# Patient Record
Sex: Male | Born: 1980 | Race: Black or African American | Hispanic: No | Marital: Single | Smoking: Never smoker
Health system: Southern US, Community
[De-identification: ages and names within clinical notes are randomized; demographics above are authoritative.]

## PROBLEM LIST (undated history)

## (undated) DIAGNOSIS — Z789 Other specified health status: Secondary | ICD-10-CM

## (undated) DIAGNOSIS — K639 Disease of intestine, unspecified: Secondary | ICD-10-CM

## (undated) DIAGNOSIS — R3915 Urgency of urination: Secondary | ICD-10-CM

## (undated) HISTORY — PX: CIRCUMCISION: SHX1350

---

## 2007-05-14 ENCOUNTER — Ambulatory Visit: Payer: Self-pay | Admitting: Urology

## 2008-03-07 ENCOUNTER — Encounter: Admission: RE | Admit: 2008-03-07 | Discharge: 2008-03-07 | Payer: Self-pay | Admitting: Family Medicine

## 2008-03-12 ENCOUNTER — Encounter: Admission: RE | Admit: 2008-03-12 | Discharge: 2008-03-12 | Payer: Self-pay | Admitting: Family Medicine

## 2008-07-19 ENCOUNTER — Emergency Department: Payer: Self-pay | Admitting: Emergency Medicine

## 2008-08-13 HISTORY — PX: BACK SURGERY: SHX140

## 2008-09-23 ENCOUNTER — Encounter: Admission: RE | Admit: 2008-09-23 | Discharge: 2008-09-23 | Payer: Self-pay | Admitting: Neurology

## 2008-10-08 ENCOUNTER — Encounter: Admission: RE | Admit: 2008-10-08 | Discharge: 2008-10-08 | Payer: Self-pay | Admitting: Neurology

## 2008-10-27 ENCOUNTER — Ambulatory Visit: Payer: Self-pay | Admitting: Thoracic Surgery

## 2008-10-29 ENCOUNTER — Inpatient Hospital Stay (HOSPITAL_COMMUNITY): Admission: RE | Admit: 2008-10-29 | Discharge: 2008-11-02 | Payer: Self-pay | Admitting: Neurosurgery

## 2008-10-29 ENCOUNTER — Ambulatory Visit: Payer: Self-pay | Admitting: Thoracic Surgery

## 2008-11-30 ENCOUNTER — Ambulatory Visit: Payer: Self-pay | Admitting: Thoracic Surgery

## 2008-11-30 ENCOUNTER — Encounter: Admission: RE | Admit: 2008-11-30 | Discharge: 2008-11-30 | Payer: Self-pay | Admitting: Thoracic Surgery

## 2010-05-09 ENCOUNTER — Emergency Department: Payer: Self-pay | Admitting: Emergency Medicine

## 2010-11-23 LAB — POCT I-STAT 7, (LYTES, BLD GAS, ICA,H+H)
Acid-base deficit: 1 mmol/L (ref 0.0–2.0)
Bicarbonate: 24.1 mEq/L — ABNORMAL HIGH (ref 20.0–24.0)
Calcium, Ion: 1.24 mmol/L (ref 1.12–1.32)
HCT: 35 % — ABNORMAL LOW (ref 39.0–52.0)
Hemoglobin: 11.9 g/dL — ABNORMAL LOW (ref 13.0–17.0)
O2 Saturation: 84 %
pH, Arterial: 7.388 (ref 7.350–7.450)
pO2, Arterial: 46 mmHg — ABNORMAL LOW (ref 80.0–100.0)
pO2, Arterial: 92 mmHg (ref 80.0–100.0)

## 2010-11-23 LAB — ABO/RH: ABO/RH(D): O POS

## 2010-11-23 LAB — CBC
HCT: 38.3 % — ABNORMAL LOW (ref 39.0–52.0)
MCHC: 34.9 g/dL (ref 30.0–36.0)
MCHC: 35.2 g/dL (ref 30.0–36.0)
MCV: 96.2 fL (ref 78.0–100.0)
MCV: 97.8 fL (ref 78.0–100.0)
Platelets: 185 10*3/uL (ref 150–400)
Platelets: 216 10*3/uL (ref 150–400)
RBC: 3.91 MIL/uL — ABNORMAL LOW (ref 4.22–5.81)
RDW: 13.7 % (ref 11.5–15.5)
RDW: 13.8 % (ref 11.5–15.5)

## 2010-11-23 LAB — URINALYSIS, ROUTINE W REFLEX MICROSCOPIC
Bilirubin Urine: NEGATIVE
Ketones, ur: NEGATIVE mg/dL
Ketones, ur: NEGATIVE mg/dL
Nitrite: NEGATIVE
Nitrite: NEGATIVE
Protein, ur: NEGATIVE mg/dL
Protein, ur: NEGATIVE mg/dL
pH: 5.5 (ref 5.0–8.0)

## 2010-11-23 LAB — COMPREHENSIVE METABOLIC PANEL
ALT: 41 U/L (ref 0–53)
AST: 22 U/L (ref 0–37)
Albumin: 3.4 g/dL — ABNORMAL LOW (ref 3.5–5.2)
Albumin: 3.9 g/dL (ref 3.5–5.2)
Alkaline Phosphatase: 56 U/L (ref 39–117)
BUN: 12 mg/dL (ref 6–23)
CO2: 24 mEq/L (ref 19–32)
Calcium: 9.5 mg/dL (ref 8.4–10.5)
Creatinine, Ser: 0.9 mg/dL (ref 0.4–1.5)
GFR calc Af Amer: >60 mL/min (ref >60)
GFR calc non Af Amer: >60 mL/min (ref >60)
Potassium: 4.1 mEq/L (ref 3.5–5.1)
Sodium: 135 mEq/L (ref 135–145)
Total Protein: 7.1 g/dL (ref 6.0–8.3)
Total Protein: 7.3 g/dL (ref 6.0–8.3)

## 2010-11-23 LAB — TYPE AND SCREEN
ABO/RH(D): O POS
Antibody Screen: NEGATIVE

## 2010-11-23 LAB — BLOOD GAS, ARTERIAL
Acid-Base Excess: 1.4 mmol/L (ref 0.0–2.0)
Drawn by: 206361
FIO2: 0.21 %
O2 Saturation: 95.3 %
Patient temperature: 98.6

## 2010-11-23 LAB — APTT: aPTT: 29 seconds (ref 24–37)

## 2010-12-26 NOTE — Op Note (Signed)
Travis Fitzpatrick, Travis Fitzpatrick               ACCOUNT NO.:  [id number]   MEDICAL RECORD NO.:  [id number]          PATIENT TYPE:  INP   LOCATION:                               FACILITY:  [facility]   PHYSICIAN:  Ines Bloomer, M.D. 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   OPERATION:  Right thoracotomy for exposure of T8-9 for diskectomy.   SURGEON:  Ines Bloomer, MD   FIRST ASSISTANT:  Coral Ceo, PA   After general anesthesia, a dual-lumen tube was inserted.  The patient  was turned to the right lateral thoracotomy position, was prepped and  draped in the usual sterile manner.  A posterolateral incision was made  over the eighth intercostal space with the latissimus being divided and  the serratus reflected anteriorly.  The eighth intercostal space was  entered.  Two PA was placed in the space.  The diaphragm was retracted  medially, and this allowed [place] to expose the eighth rib, and we followed  this down stripping the pleura off the head of the ribs and dividing  with clips and electrocautery the right intercostal artery and vein in  the eighth interspace.  This exposed the disk, and a needle was placed  in the disk and a picture taken to confirm the proper disk space, and  Dr. Franky Macho and Dr. Venetia Maxon did a diskectomy, and after that was done a  single chest tube was placed through the trocar site and tied in place  with 0 silk.  A Marcaine block was done in the usual fashion.  The chest  was closed with 4 pericostals drilling through the ninth rib, passed  around the eighth rib, #1 Vicryl in the muscle layer, 2-0 Vicryl in the  subcutaneous tissue, and Dermabond for the skin.  The patient was  returned to the recovery room in stable condition.       Ines Bloomer, M.D.  Electronically Signed     DPB/MEDQ  D:  10/29/2008  T:  10/30/2008  Job:  [id number]

## 2010-12-26 NOTE — Letter (Signed)
November 30, 2008   Coletta Memos, MD  [address]  [address]  [place], [place] [address]   Re:  Travis Fitzpatrick, Travis Fitzpatrick                 DOB:  1980-09-30   Dear Dr. Franky Macho:   I saw the patient back in the office today.  His incisions are well  healed where we did an exposure for his discectomy.  His blood pressure  was 129/82, pulse 86, respirations 18, and sats were 96%.  Right  diaphragm was somewhat elevated, but he was doing well overall.  From my  standpoint, he looks good, and I will be happy to see him again if he  has any future problems.   Sincerely,   Ines Bloomer, M.D.  Electronically Signed   DPB/MEDQ  D:  11/30/2008  T:  12/01/2008  Job:  [id number]

## 2010-12-26 NOTE — Letter (Signed)
October 27, 2008   Coletta Memos, MD  [address]  [place], [place] [address]   Re:  Travis Fitzpatrick, Travis Fitzpatrick                 DOB:  03/24/1981   Dear Ronaldo Miyamoto,   This 30 year old African American obese male had an onset of numbness  and weakness in both of his legs and mid back pain.  He was first worked  up for vitamin B12 deficiency and then underwent an MRI of his chest and  cervical spine, which revealed a T10-11 compression with mild  myelopathic lower extremities.  He has also hyperreflex in the lower  extremity.  He was seen by the neurologist referred him with Dr. Franky Macho  who recommended that he have a diskectomy.  He is referred here for  consideration of thoracic exposure.   ALLERGIES:  He has no known allergies.   MEDICATIONS:  Ibuprofen and Vicodin.  Other than obesity, he has no  other diseases.   FAMILY HISTORY:  Noncontributory.   SOCIAL HISTORY:  Single.  Works at [employer].  Does not  smoke or does not drink alcohol regularly.   REVIEW OF SYSTEMS:  Vital Signs:  He is 300 pounds, 5 feet 11 inches.  He had some mild weight loss.  Cardiac:  No angina or atrial  fibrillation.  Pulmonary:  No hemoptysis, fever, chills, or excessive  sputum.  GI:  No nausea, vomiting, constipation, diarrhea.  No GERD.  GU:  No kidney disease, but has frequent urination in the past.  No  claudication, DVT, or TIAs.  Neurological:  No dizziness, headaches,  blackouts, or seizures.  Musculoskeletal:  See history of present  illness.  Psychiatric:  No depression or nervous.  Eye/ENT:  No changes  in eyesight or hearing.  Hematological:  No problems with bleeding,  clotting disorders, or anemia.   PHYSICAL EXAMINATION:  VITAL SIGNS:  His blood pressure was 151/87,  pulse 82, respirations 20, and sats were 99%.  HEAD, EYES, EARS, NOSE, AND THROAT:  Unremarkable.  NECK:  Supple without thyromegaly.  There is no supraclavicular or  axillary adenopathy.  CHEST:  Clear  to auscultation and percussion.  HEART:  Regular sinus rhythm with no murmurs.  ABDOMEN:  Soft.  No hepatosplenomegaly.  EXTREMITIES:  Pulses are 2+.  There is no clubbing or edema.  NEUROLOGICAL:  Motor is recorded by Dr. Baxter Kail.  He has weakness in his  legs with hyperreflexia.   I discussed the exposure to the patient.  Since this is a T11-12, he  will need a left thoracotomy exposure.  He understands the risk and how  long he will be in the hospital and I should be able to expose this for  allow for adequate diskectomy.  I appreciate the opportunity of seeing  the patient.   Sincerely.   Ines Bloomer, M.D.  Electronically Signed   DPB/MEDQ  D:  10/27/2008  T:  10/27/2008  Job:  [id number]   cc:   L. Lupe Carney, M.D.

## 2010-12-26 NOTE — Op Note (Signed)
[name], [name] NO.:  [id number]   MEDICAL RECORD NO.:  [id number]          PATIENT TYPE:  INP   LOCATION:  3103                         FACILITY:  [facility]   PHYSICIAN:  Coletta Memos, M.D.     DATE OF BIRTH:  08-22-1980   DATE OF PROCEDURE:  10/29/2008  DATE OF DISCHARGE:                               OPERATIVE REPORT   PREOPERATIVE DIAGNOSIS:  Displaced disk T8-9 with myelopathy.   POSTOPERATIVE DIAGNOSIS:  Displaced disk T8-9 with myelopathy.   PROCEDURE:  Thoracotomy for T8-9 diskectomy and spinal cord  decompression with microscopic dissection.   COMPLICATIONS:  None.   SURGEON:  Coletta Memos, MD   ASSISTANT:  Danae Orleans. Venetia Maxon, MD   Opening and closure will be dictated under separate cover by Dr. Karle Plumber.   INDICATIONS:  Travis Fitzpatrick is a young man, 28 years, with a thoracic  disk herniation causing myelopathy and significant spinal cord  compression.  I felt he needed to undergo operative decompression.  He  is admitted for that operation today.   After thoracotomy was performed and the level was identified , I opened  the disk space with a #15 blade and Dr. Venetia Maxon and I with microscopic  dissection started to decompress the spinal canal.  We removed disk  material until we got to the endplates and the posterior rim of the disk  space on both superior and inferior size.  We used a curette and long  hooks to remove some of that and to clearly identify where disk  herniation was.  There was disk material that we were able to remove  overlying that area, but there was still a very hard area which was in  between 2 ballottable regions.  I used a hook and then was able to  remove large chunks of what were partially calcified disk overlying the  thecal sac at that area.  I dissected and may frankly have had some  egress of spinal fluid there, but there still some hard overlying  material.  I was not able to remove in terms of taking out of the  disk  space, but with dissection, I was able to fracture portions of it and  then made sure that it was no longer pushing on the spinal cord.  I was  satisfied with my decompression and this was all done with the  microscope and very delicately.  I never saw at any point spinal cord or  nerve root which is angling within the wound or disk space.  After my  decompression of the spinal canal and diskectomy, Dr. Edwyna Shell reentered  the case to close the thoracotomy.           ______________________________  Coletta Memos, M.D.     KC/MEDQ  D:  10/29/2008  T:  10/30/2008  Job:  [id number]

## 2010-12-26 NOTE — Discharge Summary (Signed)
Travis Fitzpatrick, Travis Fitzpatrick               ACCOUNT NO.:  [id number]   MEDICAL RECORD NO.:  [id number]          PATIENT TYPE:  INP   LOCATION:  3027                         FACILITY:  [facility]   PHYSICIAN:  Coletta Memos, M.D.     DATE OF BIRTH:  [birth year]   DATE OF ADMISSION:  10/29/2008  DATE OF DISCHARGE:  11/02/2008                               DISCHARGE SUMMARY   ADMITTING DIAGNOSIS:  Displaced disk thoracic T8-9 with myelopathy.   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   PROCEDURE:  Right thoracotomy for T8-9 diskectomy and spinal cord  decompression.   COMPLICATIONS:  None.   SURGEON:  Coletta Memos, MD.   ASSISTANCE:  Danae Orleans. Venetia Maxon, M.D.   DISCHARGE STATUS:  Alive and well.   DISCHARGE DESTINATION:  Home.   [name] has been cleared by Physical Therapy and Occupational  Therapy.  He will need home health physical therapy.   [name] presented to me in the fall of 2009.  He had profoundly weak  lower extremities and numbness in the lower extremities.  Lumbar spine  scan accompanied him.  The lumbar spine scan did not give any good  reason for his weakness or the pain, or the numbness.  I therefore had  him checked for possible peripheral neuropathy, vitamin B12 and the  like.  He was sent back to his primary care physician with a low vitamin  B12 level and then eventually found his way to the neurologist.  A  thoracic scan was done and it showed a large thoracic disk herniation at  T8-9 causing significant spinal cord compression and subsequent  myelopathy.  He came back to my office on referral because it was felt  that there was nothing else that could be done.  The thoracic disk was  not clearly identified.  However, on review of the thoracic spine film,  it certainly was evident and I felt that his progressive neurologic  decline was due to thoracic myelopathy.  I therefore recommended and he  agreed to undergo operative decompression.   Dr. Karle Plumber quickly  agreed to assist, and he was operated on this past Friday.  [name]  has done very well, seeing some improvement in his sensation.  He is  moving his legs better.  Urination is also better.  I will see him again  in a few days.  He will also be seen by Dr. Karle Plumber for followup  of his wound.           ______________________________  Coletta Memos, M.D.     KC/MEDQ  D:  11/02/2008  T:  11/03/2008  Job:  [id number]

## 2011-06-11 ENCOUNTER — Other Ambulatory Visit: Payer: Self-pay | Admitting: Neurosurgery

## 2011-06-11 DIAGNOSIS — M549 Dorsalgia, unspecified: Secondary | ICD-10-CM

## 2011-06-14 ENCOUNTER — Ambulatory Visit
Admission: RE | Admit: 2011-06-14 | Discharge: 2011-06-14 | Disposition: A | Discharge status: Home / Self Care | Payer: Medicare Other | Source: Ambulatory Visit | Attending: Neurosurgery | Admitting: Neurosurgery

## 2011-06-14 DIAGNOSIS — M549 Dorsalgia, unspecified: Secondary | ICD-10-CM

## 2011-06-14 MED ORDER — GADOBENATE DIMEGLUMINE 529 MG/ML IV SOLN
20.0000 mL | Freq: Once | INTRAVENOUS | Status: AC | PRN
  Administered 2011-06-14: 20 mL via INTRAVENOUS

## 2011-06-19 ENCOUNTER — Other Ambulatory Visit: Payer: Self-pay | Admitting: Neurosurgery

## 2011-06-19 DIAGNOSIS — M5124 Other intervertebral disc displacement, thoracic region: Secondary | ICD-10-CM

## 2011-06-20 ENCOUNTER — Ambulatory Visit
Admission: RE | Admit: 2011-06-20 | Discharge: 2011-06-20 | Disposition: A | Discharge status: Home / Self Care | Payer: Medicare Other | Source: Ambulatory Visit | Attending: Neurosurgery | Admitting: Neurosurgery

## 2011-06-20 DIAGNOSIS — M5124 Other intervertebral disc displacement, thoracic region: Secondary | ICD-10-CM

## 2011-07-10 ENCOUNTER — Other Ambulatory Visit: Payer: Self-pay | Admitting: Neurosurgery

## 2011-07-11 ENCOUNTER — Encounter (HOSPITAL_COMMUNITY): Payer: Self-pay | Admitting: Pharmacy Technician

## 2011-07-17 ENCOUNTER — Encounter (HOSPITAL_COMMUNITY): Payer: Self-pay

## 2011-07-17 ENCOUNTER — Encounter (HOSPITAL_COMMUNITY)
Admission: RE | Admit: 2011-07-17 | Discharge: 2011-07-17 | Disposition: A | Discharge status: Home / Self Care | Payer: Medicare Other | Source: Ambulatory Visit | Attending: Anesthesiology | Admitting: Anesthesiology

## 2011-07-17 ENCOUNTER — Encounter (HOSPITAL_COMMUNITY)
Admission: RE | Admit: 2011-07-17 | Discharge: 2011-07-17 | Disposition: A | Discharge status: Home / Self Care | Payer: Medicare Other | Source: Ambulatory Visit | Attending: Neurosurgery | Admitting: Neurosurgery

## 2011-07-17 HISTORY — DX: Urgency of urination: R39.15

## 2011-07-17 HISTORY — DX: Disease of intestine, unspecified: K63.9

## 2011-07-17 LAB — CBC
MCH: 31.9 pg (ref 26.0–34.0)
MCHC: 32.7 g/dL (ref 30.0–36.0)
Platelets: 171 10*3/uL (ref 150–400)
RBC: 3.98 MIL/uL — ABNORMAL LOW (ref 4.22–5.81)
RDW: 13.3 % (ref 11.5–15.5)

## 2011-07-17 LAB — BASIC METABOLIC PANEL
Calcium: 9.1 mg/dL (ref 8.4–10.5)
Creatinine, Ser: 0.92 mg/dL (ref 0.50–1.35)
GFR calc non Af Amer: >90 mL/min (ref >90)
Glucose, Bld: 85 mg/dL (ref 70–99)
Sodium: 140 mEq/L (ref 135–145)

## 2011-07-17 LAB — SURGICAL PCR SCREEN: MRSA, PCR: NEGATIVE

## 2011-07-17 NOTE — Pre-Procedure Instructions (Signed)
[address]BY SCHARA  07/17/2011 0530  Your procedure is scheduled on: dec 7  Report to [facility] [phone].  Call this number if you have problems the morning of surgery: [phone]   Remember:   Do not eat food:After Midnight.  May have clear liquids: up to 4 Hours before arrival.  Clear liquids include soda, tea, black coffee, apple or grape juice, broth.  Take these medicines the morning of surgery with A SIP OF WATER:NONE   Do not wear jewelry, make-up or nail polish.  Do not wear lotions, powders, or perfumes. You may wear deodorant.  Do not shave 48 hours prior to surgery.  Do not bring valuables to the hospital.  Contacts, dentures or bridgework may not be worn into surgery.  Leave suitcase in the car. After surgery it may be brought to your room.  For patients admitted to the hospital, checkout time is 11:00 AM the day of discharge.   Patients discharged the day of surgery will not be allowed to drive home.  Name and phone number of your driver: MOTHER  Special Instructions: CHG Shower Use Special Wash: 1/2 bottle night before surgery and 1/2 bottle morning of surgery.   Please read over the following fact sheets that you were given: Pain Booklet, MRSA Information and Surgical Site Infection Prevention

## 2011-07-19 MED ORDER — CEFAZOLIN SODIUM-DEXTROSE 2-3 GM-% IV SOLR
2.0000 g | INTRAVENOUS | Status: AC
  Administered 2011-07-20: 2 g via INTRAVENOUS
  Filled 2011-07-19: qty 50

## 2011-07-20 ENCOUNTER — Inpatient Hospital Stay (HOSPITAL_COMMUNITY): Payer: Medicare Other | Admitting: Anesthesiology

## 2011-07-20 ENCOUNTER — Encounter (HOSPITAL_COMMUNITY): Payer: Self-pay | Admitting: Anesthesiology

## 2011-07-20 ENCOUNTER — Inpatient Hospital Stay (HOSPITAL_COMMUNITY)
Admission: RE | Admit: 2011-07-20 | Discharge: 2011-07-24 | DRG: 460 | Disposition: A | Discharge status: Home / Self Care | Payer: Medicare Other | Source: Ambulatory Visit | Attending: Neurosurgery | Admitting: Neurosurgery

## 2011-07-20 ENCOUNTER — Encounter (HOSPITAL_COMMUNITY)
Admission: RE | Disposition: A | Discharge status: Home / Self Care | Payer: Self-pay | Source: Ambulatory Visit | Attending: Neurosurgery

## 2011-07-20 ENCOUNTER — Inpatient Hospital Stay (HOSPITAL_COMMUNITY): Payer: Medicare Other

## 2011-07-20 DIAGNOSIS — M5104 Intervertebral disc disorders with myelopathy, thoracic region: Principal | ICD-10-CM | POA: Diagnosis present

## 2011-07-20 DIAGNOSIS — M5124 Other intervertebral disc displacement, thoracic region: Secondary | ICD-10-CM

## 2011-07-20 DIAGNOSIS — Z01812 Encounter for preprocedural laboratory examination: Secondary | ICD-10-CM

## 2011-07-20 HISTORY — PX: LUMBAR LAMINECTOMY/DECOMPRESSION MICRODISCECTOMY: SHX5026

## 2011-07-20 HISTORY — PX: THORACOTOMY: SHX5074

## 2011-07-20 LAB — TYPE AND SCREEN
ABO/RH(D): O POS
Antibody Screen: NEGATIVE

## 2011-07-20 SURGERY — LUMBAR LAMINECTOMY/DECOMPRESSION MICRODISCECTOMY
Anesthesia: General | Site: Chest

## 2011-07-20 MED ORDER — SODIUM CHLORIDE 0.9 % IR SOLN
Status: DC | PRN
  Administered 2011-07-20: 1000 mL

## 2011-07-20 MED ORDER — HYDROMORPHONE 0.3 MG/ML IV SOLN
INTRAVENOUS | Status: DC
  Administered 2011-07-20: via INTRAVENOUS
  Administered 2011-07-20: 2.1 mg via INTRAVENOUS
  Administered 2011-07-20: 3 mg via INTRAVENOUS
  Administered 2011-07-20 – 2011-07-21 (×2): 1.81 mg via INTRAVENOUS
  Administered 2011-07-21: 1.8 mg via INTRAVENOUS
  Administered 2011-07-21: 2.1 mg via INTRAVENOUS

## 2011-07-20 MED ORDER — SODIUM CHLORIDE 0.9 % IJ SOLN
3.0000 mL | Freq: Two times a day (BID) | INTRAMUSCULAR | Status: DC
Start: 2011-07-20 — End: 2011-07-24
  Administered 2011-07-21 – 2011-07-24 (×4): 3 mL via INTRAVENOUS

## 2011-07-20 MED ORDER — ZOLPIDEM TARTRATE 10 MG PO TABS: 10.0000 mg | ORAL_TABLET | Freq: Every evening | ORAL | Status: DC | PRN

## 2011-07-20 MED ORDER — HEMOSTATIC AGENTS (NO CHARGE) OPTIME
TOPICAL | Status: DC | PRN
  Administered 2011-07-20: 1 via TOPICAL

## 2011-07-20 MED ORDER — HETASTARCH-ELECTROLYTES 6 % IV SOLN
INTRAVENOUS | Status: DC | PRN
  Administered 2011-07-20: 10:00:00 via INTRAVENOUS

## 2011-07-20 MED ORDER — ROCURONIUM BROMIDE 100 MG/10ML IV SOLN
INTRAVENOUS | Status: DC | PRN
  Administered 2011-07-20: 15 mg via INTRAVENOUS
  Administered 2011-07-20: 10 mg via INTRAVENOUS
  Administered 2011-07-20 (×2): 50 mg via INTRAVENOUS
  Administered 2011-07-20: 25 mg via INTRAVENOUS
  Administered 2011-07-20: 50 mg via INTRAVENOUS

## 2011-07-20 MED ORDER — DEXAMETHASONE SODIUM PHOSPHATE 4 MG/ML IJ SOLN
4.0000 mg | Freq: Four times a day (QID) | INTRAMUSCULAR | Status: AC
  Filled 2011-07-20 (×4): qty 1

## 2011-07-20 MED ORDER — HYDROCODONE-ACETAMINOPHEN 5-325 MG PO TABS
1.0000 | ORAL_TABLET | ORAL | Status: DC | PRN
  Administered 2011-07-21 – 2011-07-24 (×4): 2 via ORAL
  Administered 2011-07-24: 1 via ORAL
  Filled 2011-07-20 (×4): qty 2
  Filled 2011-07-20: qty 1

## 2011-07-20 MED ORDER — DEXAMETHASONE SODIUM PHOSPHATE 4 MG/ML IJ SOLN
INTRAMUSCULAR | Status: DC | PRN
  Administered 2011-07-20: 10 mg via INTRAVENOUS
  Administered 2011-07-20: 6 mg via INTRAVENOUS

## 2011-07-20 MED ORDER — LACTATED RINGERS IV SOLN
INTRAVENOUS | Status: DC | PRN
  Administered 2011-07-20 (×3): via INTRAVENOUS

## 2011-07-20 MED ORDER — NALOXONE HCL 0.4 MG/ML IJ SOLN: 0.4000 mg | INTRAMUSCULAR | Status: DC | PRN

## 2011-07-20 MED ORDER — THROMBIN 5000 UNITS EX KIT
PACK | CUTANEOUS | Status: DC | PRN
  Administered 2011-07-20: 10000 [IU] via TOPICAL
  Administered 2011-07-20: 5000 [IU] via TOPICAL

## 2011-07-20 MED ORDER — INFLUENZA VIRUS VACC SPLIT PF IM SUSP
0.5000 mL | INTRAMUSCULAR | Status: AC
  Filled 2011-07-20: qty 0.5

## 2011-07-20 MED ORDER — PROMETHAZINE HCL 25 MG/ML IJ SOLN: 6.2500 mg | INTRAMUSCULAR | Status: DC | PRN

## 2011-07-20 MED ORDER — MAGNESIUM CITRATE PO SOLN
1.0000 | Freq: Once | ORAL | Status: AC | PRN
  Filled 2011-07-20: qty 296

## 2011-07-20 MED ORDER — HYDROMORPHONE HCL PF 1 MG/ML IJ SOLN
0.2500 mg | INTRAMUSCULAR | Status: DC | PRN
  Administered 2011-07-20 (×2): 0.5 mg via INTRAVENOUS

## 2011-07-20 MED ORDER — DEXAMETHASONE SODIUM PHOSPHATE 10 MG/ML IJ SOLN: 10.0000 mg | Freq: Once | INTRAMUSCULAR | Status: DC

## 2011-07-20 MED ORDER — MIDAZOLAM HCL 5 MG/5ML IJ SOLN
INTRAMUSCULAR | Status: DC | PRN
  Administered 2011-07-20 (×2): 2 mg via INTRAVENOUS

## 2011-07-20 MED ORDER — ALBUMIN HUMAN 5 % IV SOLN
INTRAVENOUS | Status: DC | PRN
  Administered 2011-07-20 (×4): via INTRAVENOUS

## 2011-07-20 MED ORDER — ALUM & MAG HYDROXIDE-SIMETH 400-400-40 MG/5ML PO SUSP
30.0000 mL | Freq: Four times a day (QID) | ORAL | Status: DC | PRN
  Filled 2011-07-20: qty 30

## 2011-07-20 MED ORDER — OXYCODONE-ACETAMINOPHEN 5-325 MG PO TABS: 1.0000 | ORAL_TABLET | ORAL | Status: DC | PRN

## 2011-07-20 MED ORDER — MORPHINE SULFATE 10 MG/ML IJ SOLN
INTRAMUSCULAR | Status: DC | PRN
  Administered 2011-07-20 (×2): 5 mg via INTRAVENOUS

## 2011-07-20 MED ORDER — HYDROMORPHONE 0.3 MG/ML IV SOLN
INTRAVENOUS | Status: AC
  Filled 2011-07-20: qty 25

## 2011-07-20 MED ORDER — DIPHENHYDRAMINE HCL 50 MG/ML IJ SOLN
12.5000 mg | Freq: Four times a day (QID) | INTRAMUSCULAR | Status: DC | PRN
  Administered 2011-07-20 – 2011-07-21 (×2): 12.5 mg via INTRAVENOUS
  Filled 2011-07-20 (×2): qty 1

## 2011-07-20 MED ORDER — BUPIVACAINE-EPINEPHRINE PF 0.5-1:200000 % IJ SOLN
INTRAMUSCULAR | Status: DC | PRN
  Administered 2011-07-20: 30 mL

## 2011-07-20 MED ORDER — NEOSTIGMINE METHYLSULFATE 1 MG/ML IJ SOLN
INTRAMUSCULAR | Status: DC | PRN
  Administered 2011-07-20: 5 mg via INTRAVENOUS

## 2011-07-20 MED ORDER — PHENYLEPHRINE HCL 10 MG/ML IJ SOLN
INTRAMUSCULAR | Status: DC | PRN
  Administered 2011-07-20 (×10): 40 ug via INTRAVENOUS
  Administered 2011-07-20: 80 ug via INTRAVENOUS
  Administered 2011-07-20 (×6): 40 ug via INTRAVENOUS

## 2011-07-20 MED ORDER — POTASSIUM CHLORIDE IN NACL 20-0.9 MEQ/L-% IV SOLN
INTRAVENOUS | Status: DC
  Administered 2011-07-20 – 2011-07-22 (×4): via INTRAVENOUS
  Filled 2011-07-20 (×7): qty 1000

## 2011-07-20 MED ORDER — SODIUM CHLORIDE 0.9 % IJ SOLN: 9.0000 mL | INTRAMUSCULAR | Status: DC | PRN

## 2011-07-20 MED ORDER — MORPHINE SULFATE 4 MG/ML IJ SOLN: 1.0000 mg | INTRAMUSCULAR | Status: DC | PRN

## 2011-07-20 MED ORDER — SODIUM CHLORIDE 0.9 % IJ SOLN
INTRAMUSCULAR | Status: AC
  Administered 2011-07-20: 10 mL
  Filled 2011-07-20: qty 20

## 2011-07-20 MED ORDER — GLYCOPYRROLATE 0.2 MG/ML IJ SOLN
INTRAMUSCULAR | Status: DC | PRN
  Administered 2011-07-20: .6 mg via INTRAVENOUS
  Administered 2011-07-20: 0.2 mg via INTRAVENOUS

## 2011-07-20 MED ORDER — DIPHENHYDRAMINE HCL 12.5 MG/5ML PO ELIX
12.5000 mg | ORAL_SOLUTION | Freq: Four times a day (QID) | ORAL | Status: DC | PRN
  Filled 2011-07-20: qty 5

## 2011-07-20 MED ORDER — FENTANYL CITRATE 0.05 MG/ML IJ SOLN
INTRAMUSCULAR | Status: DC | PRN
  Administered 2011-07-20: 100 ug via INTRAVENOUS
  Administered 2011-07-20 (×4): 50 ug via INTRAVENOUS
  Administered 2011-07-20: 100 ug via INTRAVENOUS
  Administered 2011-07-20: 150 ug via INTRAVENOUS
  Administered 2011-07-20: 100 ug via INTRAVENOUS
  Administered 2011-07-20 (×2): 50 ug via INTRAVENOUS

## 2011-07-20 MED ORDER — DOCUSATE SODIUM 100 MG PO CAPS
100.0000 mg | ORAL_CAPSULE | Freq: Two times a day (BID) | ORAL | Status: DC
  Administered 2011-07-20 – 2011-07-24 (×8): 100 mg via ORAL
  Filled 2011-07-20 (×8): qty 1

## 2011-07-20 MED ORDER — PROPOFOL 10 MG/ML IV EMUL
INTRAVENOUS | Status: DC | PRN
  Administered 2011-07-20: 200 mg via INTRAVENOUS

## 2011-07-20 MED ORDER — ONDANSETRON HCL 4 MG/2ML IJ SOLN: 4.0000 mg | INTRAMUSCULAR | Status: DC | PRN

## 2011-07-20 MED ORDER — BUPIVACAINE 0.5 % ON-Q PUMP SINGLE CATH 400 ML
400.0000 mL | INJECTION | Status: AC
  Administered 2011-07-20: 400 mL
  Filled 2011-07-20: qty 400

## 2011-07-20 MED ORDER — MEPERIDINE HCL 25 MG/ML IJ SOLN: 6.2500 mg | INTRAMUSCULAR | Status: DC | PRN

## 2011-07-20 MED ORDER — SODIUM CHLORIDE 0.9 % IV SOLN: 250.0000 mL | INTRAVENOUS | Status: DC

## 2011-07-20 MED ORDER — ONDANSETRON HCL 4 MG/2ML IJ SOLN
INTRAMUSCULAR | Status: DC | PRN
  Administered 2011-07-20: 4 mg via INTRAVENOUS

## 2011-07-20 MED ORDER — ACETAMINOPHEN 650 MG RE SUPP: 650.0000 mg | RECTAL | Status: DC | PRN

## 2011-07-20 MED ORDER — PHENOL 1.4 % MT LIQD: 1.0000 | OROMUCOSAL | Status: DC | PRN

## 2011-07-20 MED ORDER — DICLOFENAC SODIUM 75 MG PO TBEC
75.0000 mg | DELAYED_RELEASE_TABLET | Freq: Every day | ORAL | Status: DC
  Administered 2011-07-21 – 2011-07-24 (×4): 75 mg via ORAL
  Filled 2011-07-20 (×5): qty 1

## 2011-07-20 MED ORDER — MENTHOL 3 MG MT LOZG
1.0000 | LOZENGE | OROMUCOSAL | Status: DC | PRN
  Filled 2011-07-20: qty 9

## 2011-07-20 MED ORDER — ARTIFICIAL TEARS OP OINT
TOPICAL_OINTMENT | OPHTHALMIC | Status: DC | PRN
  Administered 2011-07-20: 1 via OPHTHALMIC

## 2011-07-20 MED ORDER — ACETAMINOPHEN 325 MG PO TABS: 650.0000 mg | ORAL_TABLET | ORAL | Status: DC | PRN

## 2011-07-20 MED ORDER — HYDROMORPHONE 0.3 MG/ML IV SOLN
INTRAVENOUS | Status: DC
  Administered 2011-07-20: 16:00:00 via INTRAVENOUS
  Filled 2011-07-20: qty 25

## 2011-07-20 MED ORDER — LACTATED RINGERS IV SOLN
INTRAVENOUS | Status: DC | PRN
  Administered 2011-07-20 (×4): via INTRAVENOUS

## 2011-07-20 MED ORDER — CEFAZOLIN SODIUM 1-5 GM-% IV SOLN
1.0000 g | Freq: Three times a day (TID) | INTRAVENOUS | Status: AC
  Administered 2011-07-20 – 2011-07-21 (×3): 1 g via INTRAVENOUS
  Filled 2011-07-20 (×3): qty 50

## 2011-07-20 MED ORDER — DEXAMETHASONE 4 MG PO TABS
4.0000 mg | ORAL_TABLET | Freq: Four times a day (QID) | ORAL | Status: AC
  Administered 2011-07-20 – 2011-07-21 (×3): 4 mg via ORAL
  Filled 2011-07-20 (×4): qty 1

## 2011-07-20 MED ORDER — ONDANSETRON HCL 4 MG/2ML IJ SOLN: 4.0000 mg | Freq: Four times a day (QID) | INTRAMUSCULAR | Status: DC | PRN

## 2011-07-20 MED ORDER — DIAZEPAM 5 MG PO TABS: 5.0000 mg | ORAL_TABLET | Freq: Four times a day (QID) | ORAL | Status: DC | PRN

## 2011-07-20 MED ORDER — SODIUM CHLORIDE 0.9 % IJ SOLN: 3.0000 mL | INTRAMUSCULAR | Status: DC | PRN

## 2011-07-20 MED ORDER — MAGNESIUM HYDROXIDE 400 MG/5ML PO SUSP
30.0000 mL | Freq: Every day | ORAL | Status: DC | PRN
  Administered 2011-07-24: 30 mL via ORAL
  Filled 2011-07-20: qty 30

## 2011-07-20 SURGICAL SUPPLY — 91 items
1 VICRYL CTX 36" ×6 IMPLANT
1 VICRYL CTX POP OFFS ×3 IMPLANT
2 VICRYL TP-1 (4 IN A PACK) ×6 IMPLANT
2-0 VICRYL CTX ×6 IMPLANT
3-0 VICRYL MH ×9 IMPLANT
BAG DECANTER FOR FLEXI CONT (MISCELLANEOUS) ×3 IMPLANT
BENZOIN TINCTURE PRP APPL 2/3 (GAUZE/BANDAGES/DRESSINGS) ×3 IMPLANT
BIT DRILL 16MM (BIT) ×2 IMPLANT
BLADE SURG ROTATE 9660 (MISCELLANEOUS) ×3 IMPLANT
BONE VOID FILLER STRIP 10CC (Bone Implant) ×3 IMPLANT
BREAST CHEST DRAPE ×3 IMPLANT
BUR MATCHSTICK NEURO 3.0 LAGG (BURR) ×3 IMPLANT
BUR RND DIAMOND ELITE 4.0 (BURR) ×3 IMPLANT
BUR ROUND FLUTED 5 RND (BURR) ×3 IMPLANT
CAGE CORPECTOMY 22MM (Cage) ×3 IMPLANT
CANISTER SUCTION 2500CC (MISCELLANEOUS) ×3 IMPLANT
CATH HYDRAGLIDE XL THORACIC (CATHETERS) IMPLANT
CATH THORACIC 28FR (CATHETERS) ×6 IMPLANT
CATH THORACIC 36FR (CATHETERS) IMPLANT
CLOSURE STERI STRIP 1/2 X4 (GAUZE/BANDAGES/DRESSINGS) ×3 IMPLANT
CLOTH BEACON ORANGE TIMEOUT ST (SAFETY) ×3 IMPLANT
CONT SPEC 4OZ CLIKSEAL STRL BL (MISCELLANEOUS) ×3 IMPLANT
DECANTER SPIKE VIAL GLASS SM (MISCELLANEOUS) ×6 IMPLANT
DERMABOND ADVANCED (GAUZE/BANDAGES/DRESSINGS) ×1
DERMABOND ADVANCED .7 DNX12 (GAUZE/BANDAGES/DRESSINGS) ×2 IMPLANT
DRAPE LAPAROTOMY 100X72X124 (DRAPES) IMPLANT
DRAPE MICROSCOPE LEICA (MISCELLANEOUS) ×3 IMPLANT
DRAPE POUCH INSTRU U-SHP 10X18 (DRAPES) ×3 IMPLANT
DRAPE SURG 17X23 STRL (DRAPES) IMPLANT
DRAPE WARM FLUID 44X44 (DRAPE) IMPLANT
DRILL 16MM (BIT) ×3
DRILL BIT ×3 IMPLANT
DRSG OPSITE 4X5.5 SM (GAUZE/BANDAGES/DRESSINGS) ×3 IMPLANT
DURAPREP 26ML APPLICATOR (WOUND CARE) IMPLANT
ELECT REM PT RETURN 9FT ADLT (ELECTROSURGICAL) ×3
ELECTRODE REM PT RTRN 9FT ADLT (ELECTROSURGICAL) ×2 IMPLANT
GAUZE SPONGE 4X4 12PLY STRL LF (GAUZE/BANDAGES/DRESSINGS) ×3 IMPLANT
GAUZE SPONGE 4X4 16PLY XRAY LF (GAUZE/BANDAGES/DRESSINGS) IMPLANT
GLOVE BIOGEL PI IND STRL 7.0 (GLOVE) ×2 IMPLANT
GLOVE BIOGEL PI IND STRL 8.5 (GLOVE) ×6 IMPLANT
GLOVE BIOGEL PI INDICATOR 7.0 (GLOVE) ×1
GLOVE BIOGEL PI INDICATOR 8.5 (GLOVE) ×3
GLOVE ECLIPSE 6.5 STRL STRAW (GLOVE) ×6 IMPLANT
GLOVE ECLIPSE 8.5 STRL (GLOVE) ×6 IMPLANT
GLOVE EXAM NITRILE LRG STRL (GLOVE) IMPLANT
GLOVE EXAM NITRILE MD LF STRL (GLOVE) IMPLANT
GLOVE EXAM NITRILE XL STR (GLOVE) IMPLANT
GLOVE EXAM NITRILE XS STR PU (GLOVE) ×3 IMPLANT
GLOVE INDICATOR 8.0 STRL GRN (GLOVE) ×9 IMPLANT
GLOVE SURG SS PI 6.5 STRL IVOR (GLOVE) ×3 IMPLANT
GLOVE SURG SS PI 7.5 STRL IVOR (GLOVE) ×18 IMPLANT
GOWN BRE IMP SLV AUR LG STRL (GOWN DISPOSABLE) ×6 IMPLANT
GOWN BRE IMP SLV AUR XL STRL (GOWN DISPOSABLE) ×3 IMPLANT
GOWN STRL REIN 2XL LVL4 (GOWN DISPOSABLE) ×18 IMPLANT
HEMOSTAT SURGICEL 2X14 (HEMOSTASIS) IMPLANT
KIT BASIN OR (CUSTOM PROCEDURE TRAY) ×3 IMPLANT
KIT ROOM TURNOVER OR (KITS) ×3 IMPLANT
NEEDLE COUNTER ×3 IMPLANT
NEEDLE SPNL 18GX3.5 QUINCKE PK (NEEDLE) ×3 IMPLANT
NS IRRIG 1000ML POUR BTL (IV SOLUTION) ×6 IMPLANT
PACK CHEST (CUSTOM PROCEDURE TRAY) ×3 IMPLANT
PACK LAMINECTOMY NEURO (CUSTOM PROCEDURE TRAY) ×3 IMPLANT
PAD ARMBOARD 7.5X6 YLW CONV (MISCELLANEOUS) ×15 IMPLANT
PAIN PUMP ON-Q 400MLX5ML 5IN (MISCELLANEOUS) IMPLANT
PATTIES SURGICAL 1X1 (DISPOSABLE) ×3 IMPLANT
PROGEL ×3 IMPLANT
RUBBERBAND STERILE (MISCELLANEOUS) ×6 IMPLANT
SCREW 16MM (Screw) ×12 IMPLANT
SPONGE GAUZE 4X4 12PLY (GAUZE/BANDAGES/DRESSINGS) ×3 IMPLANT
SPONGE LAP 4X18 X RAY DECT (DISPOSABLE) IMPLANT
SPONGE SURGIFOAM ABS GEL SZ50 (HEMOSTASIS) ×3 IMPLANT
STAPLER VISISTAT 35W (STAPLE) ×3 IMPLANT
STRIP CLOSURE SKIN 1/2X4 (GAUZE/BANDAGES/DRESSINGS) IMPLANT
SUT PROLENE 4 0 RB 1 (SUTURE) ×1
SUT PROLENE 4-0 RB1 .5 CRCL 36 (SUTURE) ×2 IMPLANT
SUT VIC AB 0 CT1 18XCR BRD8 (SUTURE) ×2 IMPLANT
SUT VIC AB 0 CT1 8-18 (SUTURE) ×1
SUT VIC AB 2-0 CT1 18 (SUTURE) ×3 IMPLANT
SUT VIC AB 3-0 SH 8-18 (SUTURE) ×3 IMPLANT
SUT VIC AB 3-0 X1 27 (SUTURE) ×3 IMPLANT
SYR 20ML ECCENTRIC (SYRINGE) ×3 IMPLANT
TAPE CLOTH 4X10 WHT NS (GAUZE/BANDAGES/DRESSINGS) ×3 IMPLANT
TAPE CLOTH SURG 4X10 WHT LF (GAUZE/BANDAGES/DRESSINGS) ×3 IMPLANT
TOWEL OR 17X24 6PK STRL BLUE (TOWEL DISPOSABLE) ×3 IMPLANT
TOWEL OR 17X26 10 PK STRL BLUE (TOWEL DISPOSABLE) ×6 IMPLANT
TRAY FOLEY CATH 14FRSI W/METER (CATHETERS) ×3 IMPLANT
TUNNELER ×3 IMPLANT
TUNNELER SHEATH ON-Q 11GX8 (MISCELLANEOUS) IMPLANT
UNIVERSAL SHORT HANDLE ×3 IMPLANT
WATER STERILE IRR 1000ML POUR (IV SOLUTION) ×3 IMPLANT
trestle luxe plate 22 ×3 IMPLANT

## 2011-07-20 NOTE — Anesthesia Preprocedure Evaluation (Addendum)
Anesthesia Evaluation  Patient identified by MRN, date of birth, ID band Patient awake    Reviewed: Allergy & Precautions, H&P , NPO status , Patient's Chart, lab work & pertinent test results  Airway Mallampati: I TM Distance: >3 FB Neck ROM: Full    Dental  (+) Teeth Intact and Dental Advisory Given   Pulmonary shortness of breath and with exertion,  clear to auscultation        Cardiovascular Exercise Tolerance: Good Regular Normal    Neuro/Psych  Neuromuscular disease (pain, weakness, numbness BLE equal; occasionally left arm) Negative Psych ROS   GI/Hepatic negative GI ROS, Neg liver ROS,   Endo/Other  Negative Endocrine ROS  Renal/GU negative Renal ROS     Musculoskeletal negative musculoskeletal ROS (+)   Abdominal (+) obese,   Peds  Hematology   Anesthesia Other Findings   Reproductive/Obstetrics negative OB ROS                          Anesthesia Physical Anesthesia Plan  ASA: II  Anesthesia Plan: General   Post-op Pain Management:    Induction: Intravenous  Airway Management Planned: Double Lumen EBT  Additional Equipment: Arterial line and CVP  Intra-op Plan:   Post-operative Plan: Extubation in OR  Informed Consent: I have reviewed the patients History and Physical, chart, labs and discussed the procedure including the risks, benefits and alternatives for the proposed anesthesia with the patient or authorized representative who has indicated his/her understanding and acceptance.   Dental advisory given  Plan Discussed with: CRNA and Surgeon  Anesthesia Plan Comments:        Anesthesia Quick Evaluation

## 2011-07-20 NOTE — Preoperative (Signed)
Beta Blockers   Reason not to administer Beta Blockers:Not Applicable 

## 2011-07-20 NOTE — Anesthesia Procedure Notes (Addendum)
Procedure Name: Intubation Date/Time: 07/20/2011 7:57 AM Performed by: Leona Singleton A. Oxygen Delivery Method: Circle System Utilized Preoxygenation: Pre-oxygenation with 100% oxygen Intubation Type: IV induction Ventilation: Mask ventilation without difficulty and Oral airway inserted - appropriate to patient size Laryngoscope Size: Hyacinth Meeker and 2 Grade View: Grade II Tube type: Oral Endobronchial tube: Left, EBT position confirmed by auscultation and EBT position confirmed by fiberoptic bronchoscope and 41 Fr Number of attempts: 1 Placement Confirmation: ETT inserted through vocal cords under direct vision,  breath sounds checked- equal and bilateral and positive ETCO2 Tube secured with: Tape Dental Injury: Teeth and Oropharynx as per pre-operative assessment

## 2011-07-20 NOTE — Anesthesia Postprocedure Evaluation (Signed)
  Anesthesia Post-op Note  Patient: Travis Fitzpatrick  Procedure(s) Performed:  LUMBAR LAMINECTOMY/DECOMPRESSION MICRODISCECTOMY - right thoracotomy with thoracic eight-nine discectomy and fusion; THORACOTOMY OPEN FOR SPINE SURGERY  Patient Location: PACU  Anesthesia Type: General  Level of Consciousness: awake  Airway and Oxygen Therapy: Patient Spontanous Breathing  Post-op Pain: mild  Post-op Assessment: Post-op Vital signs reviewed  Post-op Vital Signs: stable  Complications: No apparent anesthesia complications

## 2011-07-20 NOTE — Plan of Care (Signed)
Problem: Consults Goal: Diagnosis - Spinal Surgery Outcome: Progressing Lumbar Laminectomy (Complex)     

## 2011-07-20 NOTE — Transfer of Care (Signed)
Immediate Anesthesia Transfer of Care Note  Patient: Travis Fitzpatrick  Procedure(s) Performed:  LUMBAR LAMINECTOMY/DECOMPRESSION MICRODISCECTOMY - right thoracotomy with thoracic eight-nine discectomy and fusion; THORACOTOMY OPEN FOR SPINE SURGERY  Patient Location: PACU  Anesthesia Type: General  Level of Consciousness: sedated and patient cooperative  Airway & Oxygen Therapy: Patient Spontanous Breathing and Patient connected to nasal cannula oxygen  Post-op Assessment: Report given to PACU RN, Post -op Vital signs reviewed and stable and Patient moving all extremities X 4  Post vital signs: Reviewed and stable  Complications: No apparent anesthesia complications

## 2011-07-20 NOTE — H&P (Signed)
Travis Fitzpatrick is an 30 y.o. male.   Chief Complaint: lower extremity weakness HPI: 30 yo history of thoracic disc herniation with myelopathy. He underwent thoracotomy for discetomy and had improvement. He now has had decline in his lower extremity function over last few months. He retains bowel and bladder function though it is impaired. Repeat imaging shows calcifications and possible retained disc at the same level. i therfore recommended a redo discetomy for decompression of the spinal canal.  Past Medical History  Diagnosis Date  . Shortness of breath     w/exertion  . Chronic kidney disease   . Urinary urgency   . Bowel trouble     urgency    Past Surgical History  Procedure Date  . Back surgery 2010  . Circumcision     No family history on file. Social History:  does not have a smoking history on file. He has never used smokeless tobacco. He reports that he does not drink alcohol or use illicit drugs.  Allergies:  Allergies  Allergen Reactions  . Aspirin Rash    Medications Prior to Admission  Medication Dose Route Frequency Provider Last Rate Last Dose  . ceFAZolin (ANCEF) IVPB 2 g/50 mL premix  2 g Intravenous 60 min Pre-Op [facility] Jordahl L Sakiya Stepka       No current outpatient prescriptions on file as of 07/20/2011.    No results found for this or any previous visit (from the past 48 hour(s)). No results found.  ROS  Blood pressure 108/72, pulse 84, temperature 97.7 F (36.5 C), temperature source Oral, resp. rate 20, weight 120.203 kg (265 lb), SpO2 100.00%. Physical Exam alert and oriented by 4. Speech is clear and fund of knowledge is normal. Pupils are equal round and reactive to light. He has full extraocular movements and full visual fields. He has symmetric facies and symmetric facial sensation. Hearing is intact to finger rub bilaterally. Uvula elevates in the midline. Shoulder shrug was normal. Tongue protrudes in the midline. Strength is normal 5 over 5 in the upper  extremities. The lower Charlie's he is spastic. He is hyperreflexic and hypertonic. He has a spastic gait. He is hyperreflexic at the knees and ankles bilaterally. He does have decreased proprioception in the lower extremities. Strength is approximately 4+ to 5 minus over 5 in all muscle groups of the lower extremities. He has normal muscle bulk. Coronation is poor.  There are no cervical masses or bruits. The lung fields are clear. Heart regular rhythm and rate there are no murmurs or rubs appreciated there is no clubbing cyanosis or edema.  Assessment/Plan Mr. Mcpheeters will go to the operating room for decompression via a thoracotomy. Risks and benefits including bleeding infection or relief paralysis weakness in the lower extremities bowel bladder dysfunction loss of bowel bladder control no improvement, and possible exsanguination to 2 great vessel damage. He understands and wishes to proceed.  Krystall Kruckenberg L 07/20/2011, 7:28 AM

## 2011-07-20 NOTE — Brief Op Note (Signed)
07/20/2011  1:54 PM  PATIENT:  Travis Fitzpatrick  30 y.o. male  PRE-OPERATIVE DIAGNOSIS:  thoracic herniated disc  POST-OPERATIVE DIAGNOSIS:  thoracic eight-nine herniated disc  PROCEDURE:  Procedure(s): LUMBAR LAMINECTOMY/DECOMPRESSION MICRODISCECTOMY THORACOTOMY OPEN FOR SPINE SURGERY  SURGEON:  Surgeon(s): Wolfgang Phoenix, MD Shary Key Elsner  PHYSICIAN ASSISTANT:   ASSISTANTS: Coral Ceo [facility]   ANESTHESIA:   general  EBL:  Total I/O In: 1610 [I.V.:5000; Blood:380; IV Piggyback:1500] Out: 2275 [Urine:475; Blood:1800]  BLOOD ADMINISTERED:none  DRAINS: two Chest Tube(s) in the right chest   LOCAL MEDICATIONS USED:  BUPIVICAINE 30CC  SPECIMEN:  No Specimen  DISPOSITION OF SPECIMEN:  N/A  COUNTS:  YES  TOURNIQUET:  * No tourniquets in log *  DICTATION: .Other Dictation: Dictation Number [id number]  PLAN OF CARE: Admit to inpatient   PATIENT DISPOSITION:  ICU - extubated and stable.   Delay start of Pharmacological VTE agent (>24hrs) due to surgical blood loss or risk of bleeding:  yes

## 2011-07-20 NOTE — Op Note (Signed)
07/20/2011  1:28 PM  PATIENT:  Travis Fitzpatrick  30 y.o. male  PRE-OPERATIVE DIAGNOSIS:  Thoracic 8/9 herniated disc  POST-OPERATIVE DIAGNOSIS:  thoracic eight-nine herniated disc  PROCEDURE:  Procedure(s):  THORACOTOMY OPEN FOR SPINE SURGERY 2.T8/9 discetomy with microdiscetomy 3. T 8/9 Interbody arthrodesis with peek cage, morselized allograft(Nexus) 4. Anterior Instrumentation Alphatec 22mm plate, 14mm screws  SURGEON:  Surgeon(s): Wolfgang Phoenix, MD Stefani Dama  ASSISTANTS:Elsner  ANESTHESIA:   general  EBL:  Total I/O In: 6880 [I.V.:5000; Blood:380; IV Piggyback:1500] Out: 2275 [Urine:475; Blood:1800]  BLOOD ADMINISTERED:380 CC CELLSAVER  CELL SAVER GIVEN:380 cc  COUNT:correct. Digital xray performed  DRAINS: 2 Chest Tube(s) in the thorax   SPECIMEN:  No Specimen  DICTATION: [name] was taken to the operating room placed under a general anesthetic and positioned in the left lateral decubitus position. All pressure points were properly padded, an axillary roll was also placed. Under separate cover Dr. Edwyna Shell will dictate his approach, and closure. After exposing the thoracic spine I confirmed my operative level with intraoperative xray. We were at the correct space. I then exposed the spinal canal for decompression at the T8/9 disc space. I decompressed the spinal canal at T8/9 using a high speed drill, microdissection, and blunt dissectors. The disc space was collapsed at this level secondary to previous discetomy. With Dr. Chales Salmon assistance we effected a thorough decompression of the spinal canal, exposing the dura. We removed a calcified posterior longitundinal ligament along with remnants of a calcified disc. This was done with careful dissection and kerrison punches. The thecal sac was pulsatile after the decompression. I completed the arthrodesis of T8/9 by using a peek implant 18mm in height. We sized the space and felt the 18mm interbody  would be appropiate. The implant was filled with morsellized  Allograft. The cage was placed without difficulty. We then used an Alphatec cervical plate to avoid a kickout of the cager. Xray showed the construct to be in good position. I obtained hemostasis. Dr. Edwyna Shell then reentered the case and closed the wound.   PLAN OF CARE: Admit to inpatient   PATIENT DISPOSITION:  PACU - hemodynamically stable.   Delay start of Pharmacological VTE agent (>24hrs) due to surgical blood loss or risk of bleeding:  yes

## 2011-07-21 ENCOUNTER — Inpatient Hospital Stay (HOSPITAL_COMMUNITY): Payer: Medicare Other

## 2011-07-21 MED ORDER — MORPHINE SULFATE (PF) 1 MG/ML IV SOLN: INTRAVENOUS | Status: DC

## 2011-07-21 MED ORDER — SODIUM CHLORIDE 0.9 % IJ SOLN
9.0000 mL | INTRAMUSCULAR | Status: DC | PRN
  Administered 2011-07-23: 10 mL via INTRAVENOUS

## 2011-07-21 MED ORDER — SODIUM CHLORIDE 0.9 % IJ SOLN: 9.0000 mL | INTRAMUSCULAR | Status: DC | PRN

## 2011-07-21 MED ORDER — LORATADINE 10 MG PO TABS
10.0000 mg | ORAL_TABLET | Freq: Every day | ORAL | Status: DC
  Administered 2011-07-21 – 2011-07-24 (×4): 10 mg via ORAL
  Filled 2011-07-21 (×4): qty 1

## 2011-07-21 MED ORDER — NALOXONE HCL 0.4 MG/ML IJ SOLN: 0.4000 mg | INTRAMUSCULAR | Status: DC | PRN

## 2011-07-21 MED ORDER — ONDANSETRON HCL 4 MG/2ML IJ SOLN: 4.0000 mg | Freq: Four times a day (QID) | INTRAMUSCULAR | Status: DC | PRN

## 2011-07-21 MED ORDER — DIPHENHYDRAMINE HCL 50 MG/ML IJ SOLN
12.5000 mg | Freq: Four times a day (QID) | INTRAMUSCULAR | Status: DC | PRN
  Administered 2011-07-22: 12.5 mg via INTRAVENOUS
  Filled 2011-07-21: qty 1

## 2011-07-21 MED ORDER — DIPHENHYDRAMINE HCL 12.5 MG/5ML PO ELIX
12.5000 mg | ORAL_SOLUTION | Freq: Four times a day (QID) | ORAL | Status: DC | PRN
  Filled 2011-07-21: qty 5

## 2011-07-21 MED ORDER — MORPHINE SULFATE (PF) 1 MG/ML IV SOLN
INTRAVENOUS | Status: DC
  Administered 2011-07-21: 15:00:00 via INTRAVENOUS
  Administered 2011-07-21: 4.5 mg via INTRAVENOUS
  Administered 2011-07-21: 9 mg via INTRAVENOUS
  Administered 2011-07-22: 10 mg via INTRAVENOUS
  Administered 2011-07-22: 13:00:00 via INTRAVENOUS
  Administered 2011-07-22: 3.5 mg via INTRAVENOUS
  Administered 2011-07-22: 8.36 mg via INTRAVENOUS
  Administered 2011-07-23: 23.83 mL via INTRAVENOUS
  Administered 2011-07-23: 15 mg via INTRAVENOUS
  Administered 2011-07-23: 25.5 mg via INTRAVENOUS
  Administered 2011-07-23: 08:00:00 via INTRAVENOUS
  Filled 2011-07-21 (×8): qty 25

## 2011-07-21 MED ORDER — MIDAZOLAM HCL 2 MG/2ML IJ SOLN
INTRAMUSCULAR | Status: AC
  Filled 2011-07-21: qty 2

## 2011-07-21 MED ORDER — HYDROXYZINE HCL 50 MG PO TABS
50.0000 mg | ORAL_TABLET | Freq: Four times a day (QID) | ORAL | Status: DC | PRN
  Filled 2011-07-21: qty 1

## 2011-07-21 MED ORDER — DIPHENHYDRAMINE HCL 50 MG/ML IJ SOLN: 12.5000 mg | Freq: Four times a day (QID) | INTRAMUSCULAR | Status: DC | PRN

## 2011-07-21 MED ORDER — SODIUM CHLORIDE 0.9 % IJ SOLN
INTRAMUSCULAR | Status: AC
  Administered 2011-07-21: 3 mL via INTRAVENOUS
  Filled 2011-07-21: qty 10

## 2011-07-21 MED ORDER — FENTANYL CITRATE 0.05 MG/ML IJ SOLN
INTRAMUSCULAR | Status: AC
  Filled 2011-07-21: qty 2

## 2011-07-21 MED ORDER — NON FORMULARY: 10.0000 mg | Freq: Every day | Status: DC

## 2011-07-21 NOTE — Progress Notes (Signed)
Physical Therapy Evaluation Patient Details Name: Travis Fitzpatrick MRN: [id number] DOB: June 17, 1981 Today's Date: 07/21/2011  Problem List:  Patient Active Problem List  Diagnoses  . Thoracic disc disease with myelopathy    Past Medical History:  Past Medical History  Diagnosis Date  . Shortness of breath     w/exertion  . Chronic kidney disease   . Urinary urgency   . Bowel trouble     urgency   Past Surgical History:  Past Surgical History  Procedure Date  . Back surgery 2010  . Circumcision     PT Assessment/Plan/Recommendation PT Assessment Clinical Impression Statement: pt is a 30 y/o male adm with a thoracic herniated disc.  Pt. s/p thoracotomy and T8/9 lami/fusion. pt will benefit from PTon acut and at home for weakness, decr activity tolerance, balance and pt education PT Recommendation/Assessment: Patient will need skilled PT in the acute care venue PT Problem List: Decreased strength;Decreased activity tolerance;Decreased balance;Decreased mobility;Decreased knowledge of use of DME;Decreased knowledge of precautions;Pain PT Therapy Diagnosis : Acute pain;Generalized weakness PT Plan PT Frequency: Min 5X/week PT Treatment/Interventions: Gait training;Therapeutic activities;Functional mobility training;Stair training;Patient/family education PT Recommendation Recommendations for Other Services: OT consult Follow Up Recommendations: Home health PT Equipment Recommended: Other (comment) (TBD) PT Goals  Acute Rehab PT Goals PT Goal Formulation: With patient Time For Goal Achievement: 7 days Pt will go Supine/Side to Sit: with supervision PT Goal: Supine/Side to Sit - Progress: Other (comment) Pt will go Sit to Stand: with supervision PT Goal: Sit to Stand - Progress: Other (comment) Pt will Transfer Bed to Chair/Chair to Bed: with supervision PT Transfer Goal: Bed to Chair/Chair to Bed - Progress: Other (comment) Pt will Ambulate: >150 feet;with supervision;with  least restrictive assistive device PT Goal: Ambulate - Progress: Other (comment) Pt will Go Up / Down Stairs: 3-5 stairs;with supervision;with rail(s)  PT Evaluation Precautions/Restrictions  Precautions Precautions: Back Required Braces or Orthoses: No Prior Functioning  Home Living Lives With: Family Receives Help From: Family Type of Home: House Home Layout: Two level;Able to live on main level with bedroom/bathroom Alternate Level Stairs-Rails: Right Alternate Level Stairs-Number of Steps: 14 Home Access: Stairs to enter Entrance Stairs-Rails: None Entrance Stairs-Number of Steps: 2 Bathroom Shower/Tub: Engineer, manufacturing systems: Standard Home Adaptive Equipment: Clinical research associate - four wheeled Prior Function Level of Independence: Independent with transfers;Independent with gait Able to Take Stairs?: Yes (pt was scared of falling on the stairs) Driving: Yes Cognition Cognition Arousal/Alertness: Awake/alert Overall Cognitive Status: Appears within functional limits for tasks assessed Orientation Level: Oriented X4 Sensation/Coordination Coordination Gross Motor Movements are Fluid and Coordinated: Yes Fine Motor Movements are Fluid and Coordinated: Not tested Extremity Assessment RUE Assessment RUE Assessment: Within Functional Limits LUE Assessment LUE Assessment: Within Functional Limits RLE Assessment RLE Assessment: Within Functional Limits LLE Assessment LLE Assessment: Within Functional Limits (Bil general LE weakness) Mobility (including Balance) Bed Mobility Bed Mobility: Yes Rolling Left: 3: Mod assist Rolling Left Details (indicate cue type and reason): vc/tc's for normal "log roll" technique; manual A for pain and trunk control Left Sidelying to Sit: 3: Mod assist;With rails;HOB flat Left Sidelying to Sit Details (indicate cue type and reason): vc for technique; manual A for trunk A Sitting - Scoot to Edge of Bed: 5:  Supervision Transfers Transfers: Yes Sit to Stand: 4: Min assist;From bed;From chair/3-in-1;Other (comment) (to min guard A with 2 armrests) Sit to Stand Details (indicate cue type and reason): vc for hand placement Stand to Sit: Other (comment) (min  guard A) Ambulation/Gait Ambulation/Gait: Yes Ambulation/Gait Assistance: Other (comment) (min guard A) Ambulation Distance (Feet): 50 Feet Assistive device: Rolling walker Gait Pattern: Decreased stride length;Shuffle Stairs: No  Posture/Postural Control Posture/Postural Control: No significant limitations Balance Balance Assessed: No Exercise    End of Session PT - End of Session Activity Tolerance: Patient tolerated treatment well Patient left: in chair;with call bell in reach;with family/visitor present Nurse Communication: Mobility status for ambulation;Mobility status for transfers General Behavior During Session: [facility] for tasks performed Cognition: [facility] for tasks performed  Jaelynn Currier, Eliseo Gum 07/21/2011, 1:39 PM  07/21/2011  [place] Bing, PT [phone] (pager)

## 2011-07-21 NOTE — Progress Notes (Signed)
                                              1 Day Post-Op Procedure(s) (LRB): LUMBAR LAMINECTOMY/DECOMPRESSION MICRODISCECTOMY (N/A) THORACOTOMY OPEN FOR SPINE SURGERY (N/A) Subjective: Patient is stable. CXR is stable.No air leak. He complains of itching. Orders written.  Objective: Vital signs in last 24 hours: Temp:  [97.5 F (36.4 C)-99.4 F (37.4 C)] 99.4 F (37.4 C) (12/08 0300) Pulse Rate:  [74-114] 106  (12/08 0300) Cardiac Rhythm:  [-] Sinus tachycardia;Normal sinus rhythm (12/08 0800) Resp:  [5-37] 18  (12/08 0800) BP: (101-125)/(44-72) 122/68 mmHg (12/08 0300) SpO2:  [97 %-100 %] 97 % (12/08 0800) Arterial Line BP: (130-176)/(63-88) 130/63 mmHg (12/08 0300) Weight:  [120.203 kg (265 lb)] 265 lb (120.203 kg) (12/07 1700)  Hemodynamic parameters for last 24 hours:    Intake/Output from previous day: 12/07 0701 - 12/08 0700 In: 9967.3 [P.O.:1080; I.V.:6857.3; Blood:380; IV Piggyback:1650] Out: 4900 [Urine:2700; Blood:1800; Chest Tube:400] Intake/Output this shift: Total I/O In: 80 [I.V.:80] Out: -   General appearance: alert Neurologic: intact Heart: regular rate and rhythm, S1, S2 normal, no murmur, click, rub or gallop Lungs: clear to auscultation bilaterally  Lab Results: No results found for this basename: WBC:2,HGB:2,HCT:2,PLT:2 in the last 72 hours BMET: No results found for this basename: NA:2,K:2,CL:2,CO2:2,GLUCOSE:2,BUN:2,CREATININE:2,CALCIUM:2 in the last 72 hours  PT/INR: No results found for this basename: LABPROT,INR in the last 72 hours ABG    Component Value Date/Time   PHART 7.388 10/29/2008 1136   HCO3 24.1* 10/29/2008 1136   TCO2 25 10/29/2008 1136   ACIDBASEDEF 1.0 10/29/2008 1136   O2SAT 98.0 10/29/2008 1136   CBG (last 3)  No results found for this basename: GLUCAP:3 in the last 72 hours  Assessment/Plan: S/P Procedure(s) (LRB): LUMBAR LAMINECTOMY/DECOMPRESSION MICRODISCECTOMY (N/A) THORACOTOMY OPEN FOR SPINE SURGERY (N/A) DC art  line. Decrease suction. Address itching.    LOS: 1 day    Iverna Hammac PATRICK 07/21/2011

## 2011-07-21 NOTE — Progress Notes (Signed)
Patient ID: Travis Fitzpatrick, male   DOB: [birth year], 30 y.o.   MRN: [id number] Subjective: Patient reports main complaint is itching...but prefers to discuss this w Dr Franky Macho  Objective: Vital signs in last 24 hours: Temp:  [97.5 F (36.4 C)-99.4 F (37.4 C)] 98.8 F (37.1 C) (12/08 0800) Pulse Rate:  [74-114] 84  (12/08 0800) Resp:  [5-37] 18  (12/08 0800) BP: (101-125)/(44-72) 125/68 mmHg (12/08 0800) SpO2:  [97 %-100 %] 100 % (12/08 0800) Arterial Line BP: (130-176)/(63-88) 130/63 mmHg (12/08 0300) Weight:  [120.203 kg (265 lb)] 265 lb (120.203 kg) (12/07 1700)  Intake/Output from previous day: 12/07 0701 - 12/08 0700 In: 9967.3 [P.O.:1080; I.V.:6857.3; Blood:380; IV Piggyback:1650] Out: 4900 [Urine:2700; Blood:1800; Chest Tube:400] Intake/Output this shift: Total I/O In: 80 [I.V.:80] Out: 1800 [Urine:1800]  No new deficits  Lab Results: No results found for this basename: WBC:2,HGB:2,HCT:2,PLT:2 in the last 72 hours BMET No results found for this basename: NA:2,K:2,CL:2,CO2:2,GLUCOSE:2,BUN:2,CREATININE:2,CALCIUM:2 in the last 72 hours  Studies/Results: Dg Thoracic Spine 4v  07/20/2011  *RADIOLOGY REPORT*  Clinical Data: T8-9 discectomy.  THORACIC SPINE - 4+ VIEW  Comparison: None.  Findings: We are provided with three intraoperative views of the lower thoracic spine.  Images demonstrate localization T8-9 and placement of a lateral plate and screws.  IMPRESSION: T8-9 discectomy and fusion.  Original Report Authenticated By: Bernadene Bell. Maricela Curet, M.D.   Dg Thoracic Spine 1 View  07/20/2011  *RADIOLOGY REPORT*  Clinical Data: Status post thoracic surgery.  Instrument count.  THORACIC SPINE - 1 VIEW  Comparison: None.  Findings: No unexpected radiopaque foreign body is identified. Endotracheal tube and two right chest tubes are noted.  The patient has a small right pneumothorax estimated at 15%.  Postoperative change of thoracic fusion noted.  IMPRESSION:  1.  Negative for  unexpected radiopaque foreign body. 2.  Small right pneumothorax with chest tubes in place.  Original Report Authenticated By: Bernadene Bell. Maricela Curet, M.D.   Dg Chest Port 1 View  07/21/2011  *RADIOLOGY REPORT*  Clinical Data: Video assisted thoracoscopic surgery.  PORTABLE CHEST - 1 VIEW  Comparison: 07/20/2011  Findings: Right thoracostomy times two unchanged.  Stable right internal jugular central venous catheter.  Low volumes.  Clear lungs.  Normal heart size.  No pneumothorax.  IMPRESSION: Improved upper lobe atelectasis.  No pneumothorax.  Original Report Authenticated By: Donavan Burnet, M.D.   Dg Chest Port 1 View  07/20/2011  *RADIOLOGY REPORT*  Clinical Data: Postop from thoracotomy for T8-9 discectomy.  PORTABLE CHEST - 1 VIEW  Comparison: 07/17/2011  Findings: A right jugular center venous catheter is seen with tip in the mid SVC.  Two right-sided chest tubes are seen in place, and no pneumothorax identified.  Atelectasis or infiltrate is seen in the upper lobes bilaterally.  Heart size is normal.  Fixation plate and screws noted in the lower thoracic spine.  IMPRESSION:  1.  Right jugular center venous catheter and right chest tubes in appropriate position.  No pneumothorax identified. 2.  Bilateral upper lobe atelectasis versus infiltrates.  Original Report Authenticated By: Danae Orleans, M.D.    Assessment/Plan: Doing well. Will continue present rx until chest tube is out. Looks really quite good  LOS: 1 day  As above   Reinaldo Meeker, MD 07/21/2011, 10:49 AM

## 2011-07-21 NOTE — Op Note (Signed)
NAMEARIUS, HARNOIS NO.:  [id number]  MEDICAL RECORD NO.:  [id number]  LOCATION:  3306                         FACILITY:  [facility]  PHYSICIAN:  Ines Bloomer, M.D. 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  OPERATION PERFORMED:  Redo right thoracotomy.  SURGEON:  Ines Bloomer, MD  ASSISTANTS:  Coral Ceo, PA-C and Coletta Memos, MD  ANESTHESIA:  General anesthesia.  This patient had a previous diskectomy and now had another T8-T9 problem with some mild cord compression.  Dr. Franky Macho was going to do a resection of this.  He was brought to the operating room, underwent general anesthesia.  A dual-lumen tube was inserted.  He was turned into right lateral thoracotomy position.  He was prepped and draped in the usual sterile manner.  Percutaneous insertion of all monitoring lines was done and the previous posterolateral thoracotomy was entered and the subcutaneous tissues were divided with electrocautery.  The latissimus was divided with electrocautery, and the sixth intercostal space was entered.  The lung was stuck to the previous operative site __________.  So, we started dissecting there and dissected the lung off the chest wall with electrocautery.  There was some rough surface in the lung which was leaking and this was oversewn with 3-0 Vicryl in a running fashion.  We then used the suture to hold the lung laterally and inserted a __________ the surgery.  A [place] retractor was placed to push the diaphragm down.  Dissection was started on T8-9 __________ bleeding was electrocoagulated.  We dissected up the scar tissue off this particular area.  After this had been done, Dr. Franky Macho then did his surgery with resection of the osteophyte and  decompression of cord, __________ Then, we closed by putting 2 chest tubes in separate stab wounds, straight 28 anterior and a right angle 28 posteriorly.  ProGEL was applied to the lung.  Marcaine block was done in the usual fashion.  A single On-Q was inserted in the usual fashion.  I closed the chest cavity with pericostals, 5 of them drilling through the eighth rib __________ seventh rib, passing around the sixth rib, #1 Vicryl in the muscle layer, 2-0 Vicryl in the subcutaneous tissue, and Ethicon skin clips.  A dry sterile dressing was applied.  The patient was returned to the recovery room in stable condition.     Ines Bloomer, M.D.     DPB/MEDQ  D:  07/20/2011  T:  07/21/2011  Job:  [id number]

## 2011-07-22 ENCOUNTER — Inpatient Hospital Stay (HOSPITAL_COMMUNITY): Payer: Medicare Other

## 2011-07-22 LAB — COMPREHENSIVE METABOLIC PANEL
ALT: 33 U/L (ref 0–53)
AST: 46 U/L — ABNORMAL HIGH (ref 0–37)
Alkaline Phosphatase: 48 U/L (ref 39–117)
CO2: 29 mEq/L (ref 19–32)
Calcium: 8.4 mg/dL (ref 8.4–10.5)
GFR calc non Af Amer: >90 mL/min (ref >90)
Glucose, Bld: 123 mg/dL — ABNORMAL HIGH (ref 70–99)
Potassium: 4 mEq/L (ref 3.5–5.1)
Sodium: 140 mEq/L (ref 135–145)

## 2011-07-22 LAB — CBC
HCT: 26.6 % — ABNORMAL LOW (ref 39.0–52.0)
Hemoglobin: 9 g/dL — ABNORMAL LOW (ref 13.0–17.0)
MCH: 32.7 pg (ref 26.0–34.0)
MCHC: 33.8 g/dL (ref 30.0–36.0)
RDW: 13.7 % (ref 11.5–15.5)

## 2011-07-22 MED ORDER — SODIUM CHLORIDE 0.9 % IJ SOLN
INTRAMUSCULAR | Status: AC
  Filled 2011-07-22: qty 20

## 2011-07-22 MED ORDER — SODIUM CHLORIDE 0.9 % IJ SOLN
INTRAMUSCULAR | Status: AC
  Filled 2011-07-22: qty 10

## 2011-07-22 MED ORDER — POLYSACCHARIDE IRON 150 MG PO CAPS
150.0000 mg | ORAL_CAPSULE | Freq: Every day | ORAL | Status: DC
  Administered 2011-07-22 – 2011-07-24 (×3): 150 mg via ORAL
  Filled 2011-07-22 (×3): qty 1

## 2011-07-22 NOTE — Progress Notes (Signed)
07/22/2011 7:51 PM  Paged Dr. Sanjuana Mae concerning patients complaint of feeling cold in his legs and shivering. HIs heart rate is sinus tach in the 120's and sustaining.  HR= 123 Bp= 143/ 75 Resp=18 O2= 100% on room air  Patient has no complaints of pain. Received no new orders.  Celesta Gentile

## 2011-07-22 NOTE — Progress Notes (Signed)
                                              2 Days Post-Op Procedure(s) (LRB): LUMBAR LAMINECTOMY/DECOMPRESSION MICRODISCECTOMY (N/A) THORACOTOMY OPEN FOR SPINE SURGERY (N/A) Subjective: Patient is stable. There is no air leak. Chest x-ray shows the lung well expanded. There is minimal drainage. Plan DC posterior chest tube and on Q.  Objective: Vital signs in last 24 hours: Temp:  [98 F (36.7 C)-98.7 F (37.1 C)] 98.2 F (36.8 C) (12/09 0330) Pulse Rate:  [87-128] 87  (12/09 0330) Cardiac Rhythm:  [-] Normal sinus rhythm (12/09 0330) Resp:  [16-21] 21  (12/09 0821) BP: (120-137)/(64-76) 137/64 mmHg (12/09 0330) SpO2:  [96 %-100 %] 99 % (12/09 0821)  Hemodynamic parameters for last 24 hours:    Intake/Output from previous day: 12/08 0701 - 12/09 0700 In: 3160 [P.O.:1320; I.V.:1840] Out: 5425 [Urine:5225; Chest Tube:200] Intake/Output this shift:    General appearance: alert Neurologic: intact Heart: regular rate and rhythm, S1, S2 normal, no murmur, click, rub or gallop Lungs: clear to auscultation bilaterally  Lab Results:  Basename 07/22/11 0500  WBC 14.0*  HGB 9.0*  HCT 26.6*  PLT 111*   BMET:  Basename 07/22/11 0500  NA 140  K 4.0  CL 106  CO2 29  GLUCOSE 123*  BUN 10  CREATININE 0.70  CALCIUM 8.4    PT/INR: No results found for this basename: LABPROT,INR in the last 72 hours ABG    Component Value Date/Time   PHART 7.388 10/29/2008 1136   HCO3 24.1* 10/29/2008 1136   TCO2 25 10/29/2008 1136   ACIDBASEDEF 1.0 10/29/2008 1136   O2SAT 98.0 10/29/2008 1136   CBG (last 3)  No results found for this basename: GLUCAP:3 in the last 72 hours  Assessment/Plan: S/P Procedure(s) (LRB): LUMBAR LAMINECTOMY/DECOMPRESSION MICRODISCECTOMY (N/A) THORACOTOMY OPEN FOR SPINE SURGERY (N/A) We will discontinue posterior chest tube will discontinue on cue.   LOS: 2 days    Travis Fitzpatrick [facility] 07/22/2011

## 2011-07-22 NOTE — Progress Notes (Signed)
Patient ID: Raford Pitcher, male   DOB: 16-Nov-1980, 30 y.o.   MRN: [id number] Subjective: Patient reports Feels great  Objective: Vital signs in last 24 hours: Temp:  [98 F (36.7 C)-98.7 F (37.1 C)] 98.2 F (36.8 C) (12/09 0330) Pulse Rate:  [87-128] 87  (12/09 0330) Resp:  [16-21] 21  (12/09 0821) BP: (120-137)/(64-76) 137/64 mmHg (12/09 0330) SpO2:  [96 %-100 %] 99 % (12/09 0821)  Intake/Output from previous day: 12/08 0701 - 12/09 0700 In: 3160 [P.O.:1320; I.V.:1840] Out: 5425 [Urine:5225; Chest Tube:200] Intake/Output this shift:    Awake alert. No focal deficits.   Lab Results:  Basename 07/22/11 0500  WBC 14.0*  HGB 9.0*  HCT 26.6*  PLT 111*   BMET  Basename 07/22/11 0500  NA 140  K 4.0  CL 106  CO2 29  GLUCOSE 123*  BUN 10  CREATININE 0.70  CALCIUM 8.4    Studies/Results: Dg Thoracic Spine 4v  07/20/2011  *RADIOLOGY REPORT*  Clinical Data: T8-9 discectomy.  THORACIC SPINE - 4+ VIEW  Comparison: None.  Findings: We are provided with three intraoperative views of the lower thoracic spine.  Images demonstrate localization T8-9 and placement of a lateral plate and screws.  IMPRESSION: T8-9 discectomy and fusion.  Original Report Authenticated By: Bernadene Bell. Maricela Curet, M.D.   Dg Thoracic Spine 1 View  07/20/2011  *RADIOLOGY REPORT*  Clinical Data: Status post thoracic surgery.  Instrument count.  THORACIC SPINE - 1 VIEW  Comparison: None.  Findings: No unexpected radiopaque foreign body is identified. Endotracheal tube and two right chest tubes are noted.  The patient has a small right pneumothorax estimated at 15%.  Postoperative change of thoracic fusion noted.  IMPRESSION:  1.  Negative for unexpected radiopaque foreign body. 2.  Small right pneumothorax with chest tubes in place.  Original Report Authenticated By: Bernadene Bell. Maricela Curet, M.D.   Dg Chest Port 1 View  07/21/2011  *RADIOLOGY REPORT*  Clinical Data: Video assisted thoracoscopic surgery.  PORTABLE  CHEST - 1 VIEW  Comparison: 07/20/2011  Findings: Right thoracostomy times two unchanged.  Stable right internal jugular central venous catheter.  Low volumes.  Clear lungs.  Normal heart size.  No pneumothorax.  IMPRESSION: Improved upper lobe atelectasis.  No pneumothorax.  Original Report Authenticated By: Donavan Burnet, M.D.   Dg Chest Port 1 View  07/20/2011  *RADIOLOGY REPORT*  Clinical Data: Postop from thoracotomy for T8-9 discectomy.  PORTABLE CHEST - 1 VIEW  Comparison: 07/17/2011  Findings: A right jugular center venous catheter is seen with tip in the mid SVC.  Two right-sided chest tubes are seen in place, and no pneumothorax identified.  Atelectasis or infiltrate is seen in the upper lobes bilaterally.  Heart size is normal.  Fixation plate and screws noted in the lower thoracic spine.  IMPRESSION:  1.  Right jugular center venous catheter and right chest tubes in appropriate position.  No pneumothorax identified. 2.  Bilateral upper lobe atelectasis versus infiltrates.  Original Report Authenticated By: Danae Orleans, M.D.    Assessment/Plan: Doing well. Once chest tube is out will increase activity.  LOS: 2 days  As above   Reinaldo Meeker, MD 07/22/2011, 8:25 AM

## 2011-07-22 NOTE — Progress Notes (Signed)
Physical Therapy Treatment Patient Details Name: BHAVIK CABINESS MRN: [id number] DOB: 1981/07/26 Today's Date: 07/22/2011  Attempted to see patient who was in a procedure with nursing staff. Will reattempt today as time allows vs tomorrow.    Sallyanne Kuster 07/22/2011, 2:30 PM  Sallyanne Kuster, PTA Office- [phone]

## 2011-07-22 NOTE — Progress Notes (Signed)
CSW received consult for SNF placement. PT recommendation for home with [facility] noted. CSW signing off. Please re-consult if SNF needed.  Dellie Burns, MSW, [place] [phone]

## 2011-07-23 ENCOUNTER — Inpatient Hospital Stay (HOSPITAL_COMMUNITY): Payer: Medicare Other

## 2011-07-23 MED ORDER — SODIUM CHLORIDE 0.9 % IJ SOLN
INTRAMUSCULAR | Status: AC
  Filled 2011-07-23: qty 10

## 2011-07-23 MED ORDER — SODIUM CHLORIDE 0.9 % IJ SOLN
INTRAMUSCULAR | Status: AC
  Administered 2011-07-23: 16:00:00
  Filled 2011-07-23: qty 10

## 2011-07-23 NOTE — Progress Notes (Signed)
Nursing note: PCA syringe empty, IJ CL flushed and NSL. Called report; Pt TX to 3032, VSS.

## 2011-07-23 NOTE — Progress Notes (Signed)
Occupational Therapy Evaluation Patient Details Name: Travis Fitzpatrick MRN: [id number] DOB: Nov 16, 1980 Today's Date: 07/23/2011  Problem List:  Patient Active Problem List  Diagnoses  . Thoracic disc disease with myelopathy    Past Medical History:  Past Medical History  Diagnosis Date  . Shortness of breath     w/exertion  . Chronic kidney disease   . Urinary urgency   . Bowel trouble     urgency   Past Surgical History:  Past Surgical History  Procedure Date  . Back surgery 2010  . Circumcision     OT Assessment/Plan/Recommendation OT Assessment Clinical Impression Statement: This 30 y.o. male presents to OT s/p thoracic discectomy with mildly decreased balance and decreased independence with BADLs.  Recommend OT to maximize safety and independence with BADLs to allow pt. to return home with supervision to modified independent level.   OT Recommendation/Assessment: Patient will need skilled OT in the acute care venue OT Problem List: Decreased strength;Decreased knowledge of use of DME or AE;Decreased knowledge of precautions;Pain Barriers to Discharge: None OT Therapy Diagnosis : Generalized weakness;Acute pain OT Plan OT Frequency: Min 2X/week OT Treatment/Interventions: Self-care/ADL training;DME and/or AE instruction;Therapeutic activities;Patient/family education;Balance training OT Recommendation Follow Up Recommendations: None Equipment Recommended: Questionable Tub/shower seat;3 in 1 bedside commode - will be further assessed next session Individuals Consulted Consulted and Agree with Results and Recommendations: Patient OT Goals Acute Rehab OT Goals OT Goal Formulation: With patient Time For Goal Achievement: 7 days ADL Goals Pt Will Perform Grooming: with modified independence;Standing at sink Pt Will Perform Lower Body Bathing: with supervision;Sit to stand from chair;Sit to stand from bed Pt Will Perform Lower Body Dressing: with supervision;Sit to stand  from chair;Sit to stand from bed Pt Will Transfer to Toilet: with modified independence;Ambulation Pt Will Perform Toileting - Clothing Manipulation: with modified independence;Standing Pt Will Perform Toileting - Hygiene: with modified independence;Sit to stand from 3-in-1/toilet Pt Will Perform Tub/Shower Transfer: with min assist (with seat PRN)  OT Evaluation Precautions/Restrictions  Precautions Precautions: Back Required Braces or Orthoses: No Restrictions Weight Bearing Restrictions: No Prior Functioning Home Living Bathroom Shower/Tub: Curtain (Pt took tub baths or sponge bathed) Bathroom Toilet: Standard (sink beside commode) Bathroom Accessibility: Yes How Accessible: Accessible via walker Prior Function Vocation: On disability ADL ADL Eating/Feeding: Simulated;Independent Where Assessed - Eating/Feeding: Chair Grooming: Simulated;Wash/dry hands;Teeth care;Minimal assistance ( min guard assist due to occasional LOB) Where Assessed - Grooming: Standing at sink Upper Body Bathing: Simulated;Set up Where Assessed - Upper Body Bathing: Sitting, chair Lower Body Bathing: Simulated;Minimal assistance (min guard assist) Where Assessed - Lower Body Bathing: Sit to stand from chair Upper Body Dressing: Simulated;Minimal assistance (due to lines) Where Assessed - Upper Body Dressing: Sitting, chair Lower Body Dressing: Simulated;Performed;Minimal assistance (min guard assist, and min cues for proper technique) Where Assessed - Lower Body Dressing: Sit to stand from chair;Sit to stand from bed Toilet Transfer: Simulated;Minimal assistance Toilet Transfer Method: Ambulating Toilet Transfer Equipment: Grab bars;Comfort height toilet Toileting - Clothing Manipulation: Simulated;Minimal assistance (min guard assist) Where Assessed - Glass blower/designer Manipulation: Standing Toileting - Hygiene: Simulated;Supervision/safety Where Assessed - Toileting Hygiene: Sit on 3-in-1 or  toilet Equipment Used: Rolling walker ADL Comments: Pt. able to state 3/3 back precautions with min cues.  Requires min verbal cues to maintain back precautions during ADL activities.  Pt. with LOB posteriorly when turning in bathroom and when maneuvering RW in tight spaces.  Pt. ambulated to BR and stood to urinate with min guard assist.  Pt. able to don/doff socks by crossing ankles over knees. Vision/Perception    Cognition Cognition Arousal/Alertness: Awake/alert Overall Cognitive Status: Appears within functional limits for tasks assessed Orientation Level: Oriented X4 Sensation/Coordination   Extremity Assessment RUE Assessment RUE Assessment: Within Functional Limits LUE Assessment LUE Assessment: Within Functional Limits Mobility  Bed Mobility Bed Mobility: Yes Rolling Right: 5: Supervision (log rolling) Right Sidelying to Sit: 5: Supervision;With rails Sitting - Scoot to Edge of Bed: 6: Modified independent (Device/Increase time) Transfers Transfers: Yes Sit to Stand: 4: Min assist;With upper extremity assist (min guard assist) Stand to Sit: 4: Min assist (min guard assist) Exercises   End of Session OT - End of Session Activity Tolerance: Patient tolerated treatment well Patient left: in chair;with call bell in reach General Behavior During Session: [facility] for tasks performed Cognition: [facility] for tasks performed   Yarenis Cerino M 07/23/2011, 12:55 PM

## 2011-07-23 NOTE — Progress Notes (Signed)
BP 114/58  Pulse 99  Temp(Src) 98.7 F (37.1 C) (Oral)  Resp 19  Ht 5\' 9"  (1.753 m)  Wt 120.203 kg (265 lb)  BMI 39.13 kg/m2  SpO2 97% Alert oriented x4 Moving lower extremities well Transfer to floor, chest tube removed today. Wound clean And dry no signs infection Working with PT

## 2011-07-23 NOTE — Progress Notes (Signed)
Physical Therapy Treatment Patient Details Name: Travis Fitzpatrick MRN: [id number] DOB: 08-28-1980 Today's Date: 07/23/2011  PT Assessment/Plan  PT - Assessment/Plan Comments on Treatment Session: Pt tolerated treatment well despite fatigue.  This was pt's third walk today and LLE really fatigued on stairs. Pt was able to use UE's for extra support with RW though during return to room to maintain safety despite instability of left knee. PT Plan: Discharge plan remains appropriate PT Frequency: Min 5X/week Follow Up Recommendations: Home health PT Equipment Recommended: Tub/shower seat;3 in 1 bedside comode PT Goals  Acute Rehab PT Goals PT Goal Formulation: With patient Time For Goal Achievement: 7 days Pt will go Supine/Side to Sit: with modified independence PT Goal: Supine/Side to Sit - Progress: Progressing toward goal Pt will go Sit to Stand: with modified independence PT Goal: Sit to Stand - Progress: Progressing toward goal Pt will Transfer Bed to Chair/Chair to Bed: with modified independence PT Transfer Goal: Bed to Chair/Chair to Bed - Progress: Progressing toward goal Pt will Ambulate: >150 feet;with modified independence;with rolling walker PT Goal: Ambulate - Progress: Progressing toward goal Pt will Go Up / Down Stairs: 3-5 stairs;with supervision;with rail(s) PT Goal: Up/Down Stairs - Progress: Progressing toward goal  PT Treatment Precautions/Restrictions  Precautions Precautions: Back Precaution Booklet Issued: No Required Braces or Orthoses: No Restrictions Weight Bearing Restrictions: No Mobility (including Balance) Bed Mobility Bed Mobility: Yes Sit to Supine - Right: 4: Min assist Sit to Supine - Right Details (indicate cue type and reason): min A given for legs int o the bed Transfers Transfers: Yes Sit to Stand: 5: Supervision;From chair/3-in-1;With armrests Sit to Stand Details (indicate cue type and reason): pt's sit to stand significantly  improved Stand to Sit: 5: Supervision;To bed;With upper extremity assist Ambulation/Gait Ambulation/Gait: Yes Ambulation/Gait Assistance: 4: Min assist Ambulation/Gait Assistance Details (indicate cue type and reason): min A for support especially on 2nd half of walk due to left knee instability Ambulation Distance (Feet): 200 Feet Assistive device: Rolling walker Gait Pattern: Left genu recurvatum;Decreased stride length Stairs: Yes Stairs Assistance: 4: Min assist Stairs Assistance Details (indicate cue type and reason): pt first went fwd with vc's for sequencing, then tried bkwd with vc's for sequencing Stair Management Technique: Two rails;Step to pattern;Forwards;Backwards Number of Stairs: 3  (2 times) Height of Stairs: 6  Wheelchair Mobility Wheelchair Mobility: No  Posture/Postural Control Posture/Postural Control: No significant limitations Balance Balance Assessed: No   End of Session PT - End of Session Equipment Utilized During Treatment: Gait belt Activity Tolerance: Patient limited by fatigue Patient left: in bed;with call bell in reach Nurse Communication: Mobility status for ambulation General Behavior During Session: [name] for tasks performed Cognition: [name] for tasks performed  Travis Fitzpatrick, Travis Fitzpatrick  [phone] 07/23/2011, 4:18 PM

## 2011-07-23 NOTE — Progress Notes (Signed)
                                              3 Days Post-Op Procedure(s) (LRB): LUMBAR LAMINECTOMY/DECOMPRESSION MICRODISCECTOMY (N/A) THORACOTOMY OPEN FOR SPINE SURGERY (N/A) Subjective: No air leak. CXR stable. DC chest tube.  Objective: Vital signs in last 24 hours: Temp:  [97.9 F (36.6 C)-98.9 F (37.2 C)] 98.7 F (37.1 C) (12/10 0405) Pulse Rate:  [98-122] 116  (12/10 0000) Cardiac Rhythm:  [-] Normal sinus rhythm (12/10 0405) Resp:  [15-24] 24  (12/10 0400) BP: (100-143)/(39-78) 100/39 mmHg (12/10 0000) SpO2:  [95 %-100 %] 98 % (12/10 0400) FiO2 (%):  [0 %] 0 % (12/09 2000)  Hemodynamic parameters for last 24 hours:    Intake/Output from previous day: 12/09 0701 - 12/10 0700 In: 1840 [I.V.:1840] Out: 1525 [Urine:1000; Chest Tube:525] Intake/Output this shift:    General appearance: alert  Lab Results:  Basename 07/22/11 0500  WBC 14.0*  HGB 9.0*  HCT 26.6*  PLT 111*   BMET:  Basename 07/22/11 0500  NA 140  K 4.0  CL 106  CO2 29  GLUCOSE 123*  BUN 10  CREATININE 0.70  CALCIUM 8.4    PT/INR: No results found for this basename: LABPROT,INR in the last 72 hours ABG    Component Value Date/Time   PHART 7.388 10/29/2008 1136   HCO3 24.1* 10/29/2008 1136   TCO2 25 10/29/2008 1136   ACIDBASEDEF 1.0 10/29/2008 1136   O2SAT 98.0 10/29/2008 1136   CBG (last 3)  No results found for this basename: GLUCAP:3 in the last 72 hours  Assessment/Plan: S/P Procedure(s) (LRB): LUMBAR LAMINECTOMY/DECOMPRESSION MICRODISCECTOMY (N/A) THORACOTOMY OPEN FOR SPINE SURGERY (N/A) DC chest tube.   LOS: 3 days    Elta Angell PATRICK 07/23/2011

## 2011-07-24 ENCOUNTER — Inpatient Hospital Stay (HOSPITAL_COMMUNITY): Payer: Medicare Other

## 2011-07-24 MED ORDER — HYDROCODONE-ACETAMINOPHEN 5-325 MG PO TABS: 1.0000 | ORAL_TABLET | Freq: Four times a day (QID) | ORAL | Status: AC | PRN

## 2011-07-24 MED FILL — Sodium Chloride IV Soln 0.9%: INTRAVENOUS | Qty: 1000 | Status: AC

## 2011-07-24 MED FILL — Sodium Chloride Irrigation Soln 0.9%: Qty: 3000 | Status: AC

## 2011-07-24 MED FILL — Heparin Sodium (Porcine) Inj 1000 Unit/ML: INTRAMUSCULAR | Qty: 30 | Status: AC

## 2011-07-24 NOTE — Progress Notes (Signed)
Physical Therapy Treatment Patient Details Name: ALFONZO ARCA MRN: [id number] DOB: [birth year] Today's Date: 07/24/2011  PT Assessment/Plan  PT - Assessment/Plan PT Plan: Discharge plan remains appropriate PT Frequency: Min 5X/week Follow Up Recommendations: Home health PT Equipment Recommended: Rolling walker with 5" wheels PT Goals  Acute Rehab PT Goals PT Goal: Supine/Side to Sit - Progress: Progressing toward goal PT Goal: Sit to Stand - Progress: Progressing toward goal PT Goal: Ambulate - Progress: Progressing toward goal PT Goal: Up/Down Stairs - Progress: Progressing toward goal  PT Treatment Precautions/Restrictions  Precautions Precautions: Back Precaution Booklet Issued: No Required Braces or Orthoses: No Restrictions Weight Bearing Restrictions: No Mobility (including Balance) Bed Mobility Rolling Right: 5: Supervision Rolling Right Details (indicate cue type and reason): Cues to reinforce technique & back precautions.   Right Sidelying to Sit: 5: Supervision Right Sidelying to Sit Details (indicate cue type and reason): Cues to reinforce technique & back precautions Transfers Sit to Stand: From bed;6: Modified independent (Device/Increase time) Stand to Sit: To chair/3-in-1;5: Supervision Stand to Sit Details: cues for no twisting when locating armrests of chair.  Ambulation/Gait Ambulation/Gait Assistance: 5: Supervision Ambulation/Gait Assistance Details (indicate cue type and reason): (S) for safety; increased left knee flexion with stance phase as distance increased.   Ambulation Distance (Feet): 200 Feet Assistive device: Rolling walker Gait Pattern: Step-through pattern;Left flexed knee in stance Stairs: Yes Stairs Assistance: Other (comment) (Min Guard (A)) Stairs Assistance Details (indicate cue type and reason): cues for sequencing.   Stair Management Technique: One rail Right;Step to pattern;Forwards Number of Stairs: 3  (2x's)    Exercise      End of Session PT - End of Session Equipment Utilized During Treatment: Gait belt Activity Tolerance: Patient tolerated treatment well Patient left: in chair;with call bell in reach General Behavior During Session: [facility] for tasks performed Cognition: [facility] for tasks performed  Lara Mulch 07/24/2011, 1:54 PM [phone]

## 2011-07-24 NOTE — Progress Notes (Signed)
Occupational Therapy Treatment Patient Details Name: Travis Fitzpatrick MRN: [id number] DOB: 03-21-1981 Today's Date: 07/24/2011  OT Assessment/Plan OT Assessment/Plan OT Plan: Discharge plan remains appropriate Follow Up Recommendations: None Equipment Recommended: 3 in 1 bedside commode OT Goals ADL Goals ADL Goal: Toilet Transfer - Progress: Progressing toward goals Pt Will Perform Tub/Shower Transfer:  (Min guard assist with seat prn) ADL Goal: Tub/Shower Transfer - Progress: Revised (modified due to lack of progress/goal met)  OT Treatment Precautions/Restrictions  Precautions Precautions: Back Required Braces or Orthoses: No   ADL ADL Upper Body Bathing: Simulated;Supervision/safety Where Assessed - Upper Body Bathing: Shower (standing) Lower Body Bathing: Simulated;Minimal assistance Where Assessed - Lower Body Bathing: Shower (standing) Lower Body Dressing: Performed;Set up;Supervision/safety Lower Body Dressing Details (indicate cue type and reason): VC to maintain back precautions Where Assessed - Lower Body Dressing: Sit to stand from chair Toilet Transfer: Performed;Supervision/safety;Minimal assistance Toilet Transfer Details (indicate cue type and reason): Supervision from toilet with grab bars and RW. Min assist with only RW and no UE support Toilet Transfer Method: Proofreader: Grab bars;Regular height toilet Tub/Shower Transfer: Engineer, manufacturing Method:  (sideways entry and exit with UE support on RW and wall) ADL Comments: Pt. able to verbalize 3/3 back precautions- required assist to generalize throughout treatment. Also, educated patient on maintenance of back precautions during sexual activities as well as during IADLs. Patient states he will have family and friends to assist as needed. d/w patient need for tub/shower seat as well as for 3n1. patient declines either at this time, although I feel he could  at least benefit from 3n1 Mobility  Transfers Sit to Stand: 5: Supervision;From chair/3-in-1;With armrests End of Session OT - End of Session Equipment Utilized During Treatment: Gait belt Activity Tolerance: Patient tolerated treatment well (noted LLE fatigued after ambulation for tub/shower transfer) Patient left: in chair;with call bell in reach;with family/visitor present General Behavior During Session: [facility] for tasks performed Cognition: [facility] for tasks performed  Travis Fitzpatrick  07/24/2011, 1:12 PM

## 2011-07-24 NOTE — Discharge Summary (Signed)
Physician Discharge Summary  Patient ID: Travis Fitzpatrick MRN: [id number] DOB/AGE: 1981/02/05 30 y.o.  Admit date: 07/20/2011 Discharge date: 07/24/2011  Admission Diagnoses:thoracic hnp with myelopathy T8/9  Discharge Diagnoses:  Principal Problem:  *Thoracic disc disease with myelopathy   Discharged Condition: good  Hospital Course: uncomplicated. Has had PT. Walking well. Wound clean and dry. Good strength in lower extremities  Consults: none  Significant Diagnostic Studies: none  Treatments: surgery:   Discharge Exam: Blood pressure 122/80, pulse 108, temperature 98.8 F (37.1 C), temperature source Oral, resp. rate 18, height 5\' 9"  (1.753 m), weight 120.203 kg (265 lb), SpO2 100.00%. Neurologic: Alert and oriented X 3, normal strength and tone. Normal symmetric reflexes. Normal coordination and gait Incision/Wound:clean and dry Motor exam - spastic gait, weakness lower extremities-no change from baseline Disposition: Home   Current Discharge Medication List    START taking these medications   Details  HYDROcodone-acetaminophen (NORCO) 5-325 MG per tablet Take 1 tablet by mouth every 6 (six) hours as needed for pain. Qty: 70 tablet, Refills: 0      CONTINUE these medications which have NOT CHANGED   Details  diclofenac (VOLTAREN) 75 MG EC tablet Take 75 mg by mouth daily.        STOP taking these medications     ibuprofen (ADVIL,MOTRIN) 200 MG tablet        Follow-up Information    Follow up with Lakeishia Truluck L. Make an appointment in 3 weeks. (call if t.101.5, wound starts to drain, neuro exam changes if symptoms worsen)    Contact information:   [address], [address] [place] [address] [phone]          Signed: Carmela Hurt 07/24/2011, 3:22 PM

## 2011-07-24 NOTE — Progress Notes (Signed)
                                              4 Days Post-Op Procedure(s) (LRB): LUMBAR LAMINECTOMY/DECOMPRESSION MICRODISCECTOMY (N/A) THORACOTOMY OPEN FOR SPINE SURGERY (N/A) Subjective: CXR looks good. Wound OK. May be discharged.   Objective: Vital signs in last 24 hours: Temp:  [98 F (36.7 C)-99.4 F (37.4 C)] 98.9 F (37.2 C) (12/11 0555) Pulse Rate:  [90-116] 90  (12/11 0555) Cardiac Rhythm:  [-]  Resp:  [18-20] 18  (12/11 0555) BP: (114-137)/(58-82) 128/81 mmHg (12/11 0555) SpO2:  [97 %-99 %] 97 % (12/11 0555)  Hemodynamic parameters for last 24 hours:    Intake/Output from previous day: 12/10 0701 - 12/11 0700 In: 840 [P.O.:760; I.V.:80] Out: 450 [Urine:450] Intake/Output this shift:    General appearance: alert Lungs: clear to auscultation bilaterally  Lab Results:  Basename 07/22/11 0500  WBC 14.0*  HGB 9.0*  HCT 26.6*  PLT 111*   BMET:  Basename 07/22/11 0500  NA 140  K 4.0  CL 106  CO2 29  GLUCOSE 123*  BUN 10  CREATININE 0.70  CALCIUM 8.4    PT/INR: No results found for this basename: LABPROT,INR in the last 72 hours ABG    Component Value Date/Time   PHART 7.388 10/29/2008 1136   HCO3 24.1* 10/29/2008 1136   TCO2 25 10/29/2008 1136   ACIDBASEDEF 1.0 10/29/2008 1136   O2SAT 98.0 10/29/2008 1136   CBG (last 3)  No results found for this basename: GLUCAP:3 in the last 72 hours  Assessment/Plan: S/P Procedure(s) (LRB): LUMBAR LAMINECTOMY/DECOMPRESSION MICRODISCECTOMY (N/A) THORACOTOMY OPEN FOR SPINE SURGERY (N/A) DC central line OK to DC   LOS: 4 days    [facility] 07/24/2011

## 2011-07-26 ENCOUNTER — Encounter (HOSPITAL_COMMUNITY): Payer: Self-pay | Admitting: Neurosurgery

## 2011-07-27 ENCOUNTER — Other Ambulatory Visit: Payer: Self-pay | Admitting: Thoracic Surgery

## 2011-07-27 DIAGNOSIS — I7103 Dissection of thoracoabdominal aorta: Secondary | ICD-10-CM

## 2011-07-31 ENCOUNTER — Ambulatory Visit
Admission: RE | Admit: 2011-07-31 | Discharge: 2011-07-31 | Disposition: A | Discharge status: Home / Self Care | Payer: Medicare Other | Source: Ambulatory Visit | Attending: Thoracic Surgery | Admitting: Thoracic Surgery

## 2011-07-31 ENCOUNTER — Encounter: Payer: Self-pay | Admitting: Thoracic Surgery

## 2011-07-31 ENCOUNTER — Ambulatory Visit (INDEPENDENT_AMBULATORY_CARE_PROVIDER_SITE_OTHER): Payer: Self-pay | Admitting: Thoracic Surgery

## 2011-07-31 VITALS — BP 136/75 | HR 101 | Resp 16 | Ht 69.0 in | Wt 260.0 lb

## 2011-07-31 DIAGNOSIS — Z09 Encounter for follow-up examination after completed treatment for conditions other than malignant neoplasm: Secondary | ICD-10-CM

## 2011-07-31 DIAGNOSIS — I7103 Dissection of thoracoabdominal aorta: Secondary | ICD-10-CM

## 2011-07-31 DIAGNOSIS — M5124 Other intervertebral disc displacement, thoracic region: Secondary | ICD-10-CM

## 2011-07-31 NOTE — Progress Notes (Signed)
HPI patient returns today for suture removal. His incision is healing well. He is having some postthoracotomy pain. His pain in his leg is better. I gave him a refill for Vicodin 5/325 #60. Chest x-ray showed normal postoperative changes. He will return in 4 weeks with chest x-ray   Current Outpatient Prescriptions  Medication Sig Dispense Refill  . diclofenac (VOLTAREN) 75 MG EC tablet Take 75 mg by mouth daily.        Marland Kitchen HYDROcodone-acetaminophen (NORCO) 5-325 MG per tablet Take 1 tablet by mouth every 6 (six) hours as needed for pain.  70 tablet  0     Review of Systems: Unchanged   Physical Exam lungs are clear to auscultation percussion   Diagnostic Tests: Chest x-ray showed normal postoperative changes   Impression: Status post right thoracotomy for T8-9 discectomy   Plan: Return in 4 weeks with chest x-ray

## 2011-08-24 ENCOUNTER — Other Ambulatory Visit: Payer: Self-pay | Admitting: Thoracic Surgery

## 2011-08-24 DIAGNOSIS — M5124 Other intervertebral disc displacement, thoracic region: Secondary | ICD-10-CM

## 2011-08-28 ENCOUNTER — Ambulatory Visit (INDEPENDENT_AMBULATORY_CARE_PROVIDER_SITE_OTHER): Payer: Self-pay | Admitting: Thoracic Surgery

## 2011-08-28 ENCOUNTER — Encounter: Payer: Self-pay | Admitting: Thoracic Surgery

## 2011-08-28 ENCOUNTER — Ambulatory Visit
Admission: RE | Admit: 2011-08-28 | Discharge: 2011-08-28 | Disposition: A | Discharge status: Home / Self Care | Payer: Medicare Other | Source: Ambulatory Visit | Attending: Thoracic Surgery | Admitting: Thoracic Surgery

## 2011-08-28 VITALS — BP 131/68 | HR 76 | Resp 18 | Ht 69.0 in | Wt 260.0 lb

## 2011-08-28 DIAGNOSIS — Z9889 Other specified postprocedural states: Secondary | ICD-10-CM

## 2011-08-28 DIAGNOSIS — M5124 Other intervertebral disc displacement, thoracic region: Secondary | ICD-10-CM

## 2011-08-28 NOTE — Progress Notes (Signed)
HPI the patient returns for followup. Incision is well healed. Chest x-ray shows normal postoperative changes. His pain is decreasing. He will be followup by his neurosurgeon.   Current Outpatient Prescriptions  Medication Sig Dispense Refill  . diclofenac (VOLTAREN) 75 MG EC tablet Take 75 mg by mouth daily.        Marland Kitchen HYDROcodone-acetaminophen (LORTAB) 7.5-500 MG per tablet Take 1 tablet by mouth every 6 (six) hours as needed.         Review of Systems: Unchanged   Physical Exam lungs clear to auscultation percussion   Diagnostic Tests: Chest x-ray showed normal postoperative changes   Impression: T10-11 disektomy with effusion   Plan: Return as needed

## 2011-10-25 ENCOUNTER — Encounter: Payer: Self-pay | Admitting: Specialist

## 2011-11-12 ENCOUNTER — Encounter: Payer: Self-pay | Admitting: Specialist

## 2011-12-12 ENCOUNTER — Encounter: Payer: Self-pay | Admitting: Specialist

## 2012-01-12 ENCOUNTER — Encounter: Payer: Self-pay | Admitting: Specialist

## 2012-04-21 ENCOUNTER — Other Ambulatory Visit: Payer: Self-pay | Admitting: Neurosurgery

## 2012-04-21 DIAGNOSIS — M546 Pain in thoracic spine: Secondary | ICD-10-CM

## 2012-04-21 DIAGNOSIS — M549 Dorsalgia, unspecified: Secondary | ICD-10-CM

## 2012-04-26 ENCOUNTER — Ambulatory Visit
Admission: RE | Admit: 2012-04-26 | Discharge: 2012-04-26 | Disposition: A | Discharge status: Home / Self Care | Payer: Medicare Other | Source: Ambulatory Visit | Attending: Neurosurgery | Admitting: Neurosurgery

## 2012-04-26 DIAGNOSIS — M546 Pain in thoracic spine: Secondary | ICD-10-CM

## 2012-04-26 DIAGNOSIS — M549 Dorsalgia, unspecified: Secondary | ICD-10-CM

## 2012-04-26 MED ORDER — GADOBENATE DIMEGLUMINE 529 MG/ML IV SOLN
20.0000 mL | Freq: Once | INTRAVENOUS | Status: AC | PRN
  Administered 2012-04-26: 20 mL via INTRAVENOUS

## 2012-05-05 ENCOUNTER — Encounter (HOSPITAL_COMMUNITY): Payer: Self-pay | Admitting: Pharmacy Technician

## 2012-05-05 ENCOUNTER — Other Ambulatory Visit: Payer: Self-pay | Admitting: Neurosurgery

## 2012-05-05 ENCOUNTER — Other Ambulatory Visit (HOSPITAL_COMMUNITY): Payer: Self-pay | Admitting: Neurosurgery

## 2012-05-05 DIAGNOSIS — M5104 Intervertebral disc disorders with myelopathy, thoracic region: Secondary | ICD-10-CM

## 2012-05-08 ENCOUNTER — Ambulatory Visit (HOSPITAL_COMMUNITY)
Admission: RE | Admit: 2012-05-08 | Discharge: 2012-05-08 | Disposition: A | Discharge status: Home / Self Care | Payer: Medicare Other | Source: Ambulatory Visit | Attending: Neurosurgery | Admitting: Neurosurgery

## 2012-05-08 DIAGNOSIS — M25559 Pain in unspecified hip: Secondary | ICD-10-CM | POA: Insufficient documentation

## 2012-05-08 DIAGNOSIS — G959 Disease of spinal cord, unspecified: Secondary | ICD-10-CM | POA: Insufficient documentation

## 2012-05-08 DIAGNOSIS — M549 Dorsalgia, unspecified: Secondary | ICD-10-CM | POA: Insufficient documentation

## 2012-05-08 DIAGNOSIS — M25569 Pain in unspecified knee: Secondary | ICD-10-CM | POA: Insufficient documentation

## 2012-05-08 DIAGNOSIS — M5104 Intervertebral disc disorders with myelopathy, thoracic region: Secondary | ICD-10-CM

## 2012-05-08 MED ORDER — OXYCODONE-ACETAMINOPHEN 5-325 MG PO TABS
ORAL_TABLET | ORAL | Status: AC
  Filled 2012-05-08: qty 1

## 2012-05-08 MED ORDER — IOHEXOL 300 MG/ML  SOLN
10.0000 mL | Freq: Once | INTRAMUSCULAR | Status: AC | PRN
  Administered 2012-05-08: 5 mL via INTRATHECAL

## 2012-05-08 MED ORDER — ONDANSETRON HCL 4 MG/2ML IJ SOLN: 4.0000 mg | Freq: Four times a day (QID) | INTRAMUSCULAR | Status: DC | PRN

## 2012-05-08 MED ORDER — OXYCODONE-ACETAMINOPHEN 5-325 MG PO TABS
1.0000 | ORAL_TABLET | ORAL | Status: DC | PRN
  Administered 2012-05-08: 1 via ORAL

## 2012-05-08 MED ORDER — DIAZEPAM 5 MG PO TABS
10.0000 mg | ORAL_TABLET | Freq: Once | ORAL | Status: AC
  Administered 2012-05-08: 10 mg via ORAL

## 2012-05-08 MED ORDER — DIAZEPAM 5 MG PO TABS
ORAL_TABLET | ORAL | Status: AC
  Administered 2012-05-08: 10 mg via ORAL
  Filled 2012-05-08: qty 2

## 2012-05-08 MED ORDER — IOHEXOL 300 MG/ML  SOLN
10.0000 mL | Freq: Once | INTRAMUSCULAR | Status: AC | PRN
  Administered 2012-05-08: 10 mL via INTRATHECAL

## 2012-06-10 ENCOUNTER — Encounter: Payer: Self-pay | Admitting: Surgery

## 2012-06-10 ENCOUNTER — Institutional Professional Consult (permissible substitution) (INDEPENDENT_AMBULATORY_CARE_PROVIDER_SITE_OTHER): Payer: Medicare Other | Admitting: Surgery

## 2012-06-10 VITALS — BP 130/75 | HR 72 | Resp 20 | Ht 69.0 in | Wt 275.0 lb

## 2012-06-10 DIAGNOSIS — M5124 Other intervertebral disc displacement, thoracic region: Secondary | ICD-10-CM

## 2012-06-10 NOTE — Progress Notes (Signed)
                  [address]            [place], [address]          [phone]      PCP is MITCHELL,LEWIS DEAN, MD Referring Provider is Cabbell, Kyle L, MD    Chief Complaint   Patient presents with   .  Herniated Nucleus Propulsus       Referral from Dr Cabbell for surgical eval Thoracic exposure        HPI: The patient is a 31-year-old gentleman who underwent right thoracotomy for exposure of T8-9 by Dr. Burney for discectomy in March of 2010. He subsequently underwent redo right thoracotomy for exposure of T8-9 on 07/20/2011 by Dr. Burney for repeat discectomy and instrumentation. He now returns with complaints of urinary urgency and decreased sphincter control as well as weak anorectal sphincter control and weakness in his left knee. He has also had intermittent numbness in both legs and feet. A CT myelogram shows some spinal cord compression at T7-8 and Dr. Cabbell is planning repeat spine surgery at that level.    Past Medical History   Diagnosis  Date   .  Shortness of breath         w/exertion   .  Chronic kidney disease     .  Urinary urgency     .  Bowel trouble         urgency         Past Surgical History   Procedure  Date   .  Back surgery  2010   .  Circumcision     .  Lumbar laminectomy/decompression microdiscectomy  07/20/2011       Procedure: LUMBAR LAMINECTOMY/DECOMPRESSION MICRODISCECTOMY;  Surgeon: Kyle L Cabbell;  Location: [facility];  Service: Neurosurgery;  Laterality: N/A;  right thoracotomy with thoracic eight-nine discectomy and fusion   .  Thoracotomy  07/20/2011       Procedure: THORACOTOMY OPEN FOR SPINE SURGERY;  Surgeon: D Patrick Burney, MD;  Location: [facility];  Service: Vascular;  Laterality: N/A;        History reviewed. No pertinent family history.   Social History History   Substance Use Topics   .  Smoking status:  Never Smoker    .  Smokeless tobacco:  Never Used   .  Alcohol Use:  No        Current Outpatient Prescriptions   Medication  Sig  Dispense  Refill   .  ibuprofen (ADVIL,MOTRIN) 800 MG tablet  Take 800 mg by mouth every 8 (eight) hours as needed. For pain               Allergies   Allergen  Reactions   .  Aspirin  Rash        Review of Systems  Constitutional: Positive for fatigue.  HENT: Negative.   Eyes: Negative.   Respiratory: Negative.   Cardiovascular: Negative.   Genitourinary: Positive for urgency and frequency.  Musculoskeletal: Positive for back pain and gait problem.  Skin: Negative.   Neurological: Positive for weakness and numbness.        Weakness in left knee   Numbness in both legs and feet  Hematological: Negative.   Psychiatric/Behavioral: The patient is nervous/anxious.         Recent stress due to his symptoms.      BP 130/75    Pulse 72  Resp 20  Ht 5' 9" (1.753 m)  Wt 275 lb (124.739 kg)  BMI 40.61 kg/m2  SpO2 98% Physical Exam  Constitutional: He is oriented to person, place, and time. He appears well-developed and well-nourished. No distress.       Obese gentleman  HENT:   Head: Normocephalic and atraumatic.   Mouth/Throat: Oropharynx is clear and moist.  Eyes: Conjunctivae normal and EOM are normal. Pupils are equal, round, and reactive to light.  Neck: Normal range of motion. Neck supple. No JVD present. No tracheal deviation present. No thyromegaly present.  Cardiovascular: Normal rate, regular rhythm, normal heart sounds and intact distal pulses.  Exam reveals no gallop and no friction rub.    No murmur heard. Pulmonary/Chest: Effort normal and breath sounds normal. No respiratory distress. He exhibits no tenderness.  Abdominal: Soft. Bowel sounds are normal. He exhibits no distension and no mass. There is no tenderness.  Musculoskeletal: Normal range of motion. He exhibits no edema.  Lymphadenopathy:    He has no cervical adenopathy.  Neurological: He is alert and oriented to person, place, and time.         Diagnostic Tests:   *RADIOLOGY REPORT*   Clinical Data:  Thoracic and lumbar pain.  Numbness of the lower extremities.  Previous T8-9 surgery.   MRI THORACIC AND LUMBAR SPINE WITHOUT AND WITH CONTRAST   Technique:  Multiplanar and multiecho pulse sequences of the thoracic and lumbar spine were obtained without and with intravenous contrast.   Contrast: 20mL MULTIHANCE GADOBENATE DIMEGLUMINE 529 MG/ML IV SOLN   Comparison:  Lumbar MRI 03/07/2008.  Operative radiographs March 2010.  Thoracic radiographs 04/17/2012.  CT thoracic spine 06/20/2011.   MRI THORACIC SPINE   Findings: There is no significant finding at T6-7 or above.   T7-8 shows a right paracentral disc herniation that indents the thecal sac.  There is extensive cord atrophy/myelomalacia in the region from T7-T9.   At T8-9, there has been previous fusion.  There is wide patency of the canal.   T9-10 disc bulges mildly.  T10-11, T11-12 T12-L1 are normal.   IMPRESSION: Previous fusion and decompression at T8-9.  Wide patency of the canal and foramina.  Pronounced cord atrophy/myelomalacia from T7- T9.   Central to right-sided disc herniation at T7-8 indents the thecal sac but does not appear to compress the neural structures.  This appears the same as on the preoperative study.   MRI LUMBAR SPINE   Findings: There is no abnormality at L1-2, L2-3 or L3-4.   At L4-5, the disc shows desiccation and mild bulging.  There is mild facet ligamentous hypertrophy.  No compressive stenosis.   At L5-S1, the disc shows desiccation and bulging.  There is mild facet and ligamentous hypertrophy.  There is a superior end plate Schmorl's node on the right at the sacrum with mild edema and enhancement that could relate to back pain.  There is annular tearing but no herniated nuclear material.   IMPRESSION: L4-5:  Desiccation and bulging of the disc.  Mild facet hypertrophy.  No stenosis.   L5 S1:  Annular  tearing annular bulging.  No compressive stenosis. Superior end plate Schmorl's node at S1 could be associated with pain.     Original Report Authenticated By: MARK E. SHOGRY, M.D. *RADIOLOGY REPORT*   Clinical Data:  Back pain with myelopathy.  Right hip pain extending to the knee.   MYELOGRAM  THORACIC AND LUMBAR   Technique: Lumbar puncture and intrathecal   contrast administration were performed by Dr. Cabbell who will separately report for this portion of the procedure.  I personally supervised acquisition of the myelogram images. Following injection of intrathecal Omnipaque contrast, spine imaging in multiple projections was performed using fluoroscopy.   Comparison: MRI lumbar and thoracic spine 04/26/2012. Multiple prior studies including lumbar MRI from 03/07/2008.  Operative radiographs of March 2010.  And CT thoracic spine 06/20/2011.   Findings: Lumbar puncture was performed at L4-5 using a 5-inch needle via a left paramedian approach.  Initially attempt at L3-4 was unsuccessful due to the patient's body habitus.   There is slight narrowing of the thecal sac diffusely without focal extradural defect.  This may relate to congenitally short pedicles. No lumbar nerve root cut off is seen.  There is no vertebral subluxation or visible focal disc protrusion.  Some contrast extends into the paraspinous soft tissues on the left at L3-4  from previous attempt at myelography with a shorter needle.   Contrast was maneuvered into the thoracic region where there was no block.  There is previous discectomy and partial corpectomy with strut graft at T8-9 from a right transthoracic approach.  There is a ventral tissue defect at T7-8 which displaces the subarachnoid space and cord to a moderate degree.   Fluoroscopy Time: 2.13 minutes   IMPRESSION: As above.   CT MYELOGRAPHY THORACIC SPINE   Technique: CT imaging of the thoracic spine was performed after intrathecal  contrast administration.  Multiplanar CT image reconstructions were also generated.   Findings:  The disc spaces from T1-T7 are unremarkable.   At T7-T8, there is a right paramedian central protrusion which moderately flattens the cord and effaces the anterior subarachnoid space.  This is similar to recent priors, specifically 04/26/2012. Superimposed cord atrophy could partially account for the small size of the cord at this level, but there does appear to be significant mass of a ventrally.   At T8-9, there is satisfactory decompression of the spinal canal and cord without residual compression.  Hardware and interbody graft appears satisfactory.   The remaining thoracic disc spaces are unremarkable.   IMPRESSION: Satisfactory appearance status post T8-9 discectomy.   Central and rightward protrusion at T7-8 without spinal block. There is moderate cord flattening at this level, similar to the prior MR of 04/26/2012.   CT MYELOGRAPHY LUMBAR SPINE   Technique: CT imaging of the lumbar spine was performed after intrathecal contrast administration.  Multiplanar CT image reconstructions were also generated.   Findings:  There are no prevertebral or paraspinous masses.   There is mild generalized narrowing the spinal canal relative to short pedicles but no focal disc protrusion or nerve root cut off. There is no significant epidural lipomatosis.  Mild annular bulging at L5-S1 and bilateral L4-5 and L5-S1 facet arthropathy is noted.   Fairly large Schmorl's nodes projecting superiorly from L4-5 could serve as a focus of back pain, as could a smaller Schmorl's node projecting inferiorly from L5-S1.   Compared with prior MR, there is similar appearance.   IMPRESSION: No lumbar disc protrusion or significant spinal stenosis.  See comments above.     Original Report Authenticated By: JOHN T. CURNES, M.D.    Impression:   He has some degree of spinal cord compression at  T7-8 and it is felt this could be contributing to his symptoms. He will require exposure of T7-8 for spinal surgery by Dr. Cabbell. This will be his third thoracotomy and could be complicated due to adhesions

## 2012-06-12 ENCOUNTER — Other Ambulatory Visit: Payer: Self-pay | Admitting: Neurosurgery

## 2012-07-02 ENCOUNTER — Encounter (HOSPITAL_COMMUNITY): Payer: Self-pay | Admitting: Pharmacy Technician

## 2012-07-07 ENCOUNTER — Other Ambulatory Visit: Payer: Self-pay | Admitting: *Deleted

## 2012-07-07 ENCOUNTER — Encounter (HOSPITAL_COMMUNITY): Payer: Self-pay

## 2012-07-07 ENCOUNTER — Encounter (HOSPITAL_COMMUNITY)
Admission: RE | Admit: 2012-07-07 | Discharge: 2012-07-07 | Disposition: A | Discharge status: Home / Self Care | Payer: Medicare Other | Source: Ambulatory Visit | Attending: Neurosurgery | Admitting: Neurosurgery

## 2012-07-07 VITALS — BP 122/80 | HR 65 | Temp 98.2°F | Resp 20 | Ht 69.0 in | Wt 281.6 lb

## 2012-07-07 DIAGNOSIS — G9520 Unspecified cord compression: Secondary | ICD-10-CM

## 2012-07-07 LAB — BASIC METABOLIC PANEL
Calcium: 9.8 mg/dL (ref 8.4–10.5)
GFR calc Af Amer: >90 mL/min (ref >90)
GFR calc non Af Amer: >90 mL/min (ref >90)
Potassium: 4.5 mEq/L (ref 3.5–5.1)
Sodium: 138 mEq/L (ref 135–145)

## 2012-07-07 LAB — TYPE AND SCREEN
ABO/RH(D): O POS
Antibody Screen: NEGATIVE

## 2012-07-07 LAB — SURGICAL PCR SCREEN
MRSA, PCR: NEGATIVE
Staphylococcus aureus: NEGATIVE

## 2012-07-07 LAB — CBC
Hemoglobin: 13.9 g/dL (ref 13.0–17.0)
MCH: 32.5 pg (ref 26.0–34.0)
Platelets: 193 10*3/uL (ref 150–400)
RBC: 4.28 MIL/uL (ref 4.22–5.81)

## 2012-07-07 NOTE — Pre-Procedure Instructions (Addendum)
[address] BRANDON SIMILIEN  07/07/2012   Your procedure is scheduled on:  07/16/12  Report to [facility] at 600 AM.  Call this number if you have problems the morning of surgery: [phone]   Remember:   Do not eat food:or drinkAfter Midnight.    Take these medicines the morning of surgery with A SIP OF WATER: none  STOP ibuprofen   Do not wear jewelry, make-up or nail polish.  Do not wear lotions, powders, or perfumes. You may not wear deodorant.  Do not shave 48 hours prior to surgery. Men may shave face and neck.  Do not bring valuables to the hospital.  Contacts, dentures or bridgework may not be worn into surgery.  Leave suitcase in the car. After surgery it may be brought to your room.  For patients admitted to the hospital, checkout time is 11:00 AM the day of discharge.   Patients discharged the day of surgery will not be allowed to drive home.  Name and phone number of your driver: mom Alona Bene [phone]  Special Instructions: Shower using CHG 2 nights before surgery and the night before surgery.  If you shower the day of surgery use CHG.  Use special wash - you have one bottle of CHG for all showers.  You should use approximately 1/3 of the bottle for each shower.   Please read over the following fact sheets that you were given: Pain Booklet, Coughing and Deep Breathing, Blood Transfusion Information, MRSA Information and Surgical Site Infection Prevention

## 2012-07-07 NOTE — Progress Notes (Signed)
Dr Laneta Simmers office called for permit verbage,any orders. Message left for [facility]

## 2012-07-15 MED ORDER — DEXTROSE 5 % IV SOLN
3.0000 g | INTRAVENOUS | Status: AC
  Administered 2012-07-16: 3 g via INTRAVENOUS
  Filled 2012-07-15: qty 3000

## 2012-07-16 ENCOUNTER — Encounter (HOSPITAL_COMMUNITY): Payer: Self-pay | Admitting: *Deleted

## 2012-07-16 ENCOUNTER — Encounter (HOSPITAL_COMMUNITY)
Admission: RE | Disposition: A | Discharge status: Home / Self Care | Payer: Self-pay | Source: Ambulatory Visit | Attending: Neurosurgery

## 2012-07-16 ENCOUNTER — Ambulatory Visit (HOSPITAL_COMMUNITY): Payer: Medicare Other | Admitting: Anesthesiology

## 2012-07-16 ENCOUNTER — Ambulatory Visit (HOSPITAL_COMMUNITY): Payer: Medicare Other

## 2012-07-16 ENCOUNTER — Encounter (HOSPITAL_COMMUNITY): Payer: Self-pay | Admitting: Anesthesiology

## 2012-07-16 ENCOUNTER — Other Ambulatory Visit: Payer: Self-pay

## 2012-07-16 ENCOUNTER — Inpatient Hospital Stay (HOSPITAL_COMMUNITY)
Admission: RE | Admit: 2012-07-16 | Discharge: 2012-07-22 | DRG: 490 | Disposition: A | Discharge status: Home / Self Care | Payer: Medicare Other | Source: Ambulatory Visit | Attending: Neurosurgery | Admitting: Neurosurgery

## 2012-07-16 DIAGNOSIS — Z886 Allergy status to analgesic agent status: Secondary | ICD-10-CM

## 2012-07-16 DIAGNOSIS — M5104 Intervertebral disc disorders with myelopathy, thoracic region: Secondary | ICD-10-CM | POA: Diagnosis present

## 2012-07-16 DIAGNOSIS — Y834 Other reconstructive surgery as the cause of abnormal reaction of the patient, or of later complication, without mention of misadventure at the time of the procedure: Secondary | ICD-10-CM | POA: Diagnosis not present

## 2012-07-16 DIAGNOSIS — R066 Hiccough: Secondary | ICD-10-CM | POA: Diagnosis not present

## 2012-07-16 DIAGNOSIS — N189 Chronic kidney disease, unspecified: Secondary | ICD-10-CM | POA: Diagnosis present

## 2012-07-16 DIAGNOSIS — Y9210 Unspecified residential institution as the place of occurrence of the external cause: Secondary | ICD-10-CM | POA: Diagnosis not present

## 2012-07-16 DIAGNOSIS — Z79899 Other long term (current) drug therapy: Secondary | ICD-10-CM

## 2012-07-16 DIAGNOSIS — Z01812 Encounter for preprocedural laboratory examination: Secondary | ICD-10-CM

## 2012-07-16 DIAGNOSIS — M5124 Other intervertebral disc displacement, thoracic region: Secondary | ICD-10-CM

## 2012-07-16 DIAGNOSIS — J95811 Postprocedural pneumothorax: Secondary | ICD-10-CM | POA: Diagnosis not present

## 2012-07-16 DIAGNOSIS — G9520 Unspecified cord compression: Secondary | ICD-10-CM

## 2012-07-16 DIAGNOSIS — Z981 Arthrodesis status: Secondary | ICD-10-CM

## 2012-07-16 HISTORY — PX: THORACIC DISCECTOMY: SHX6113

## 2012-07-16 HISTORY — PX: THORACOTOMY: SHX5074

## 2012-07-16 LAB — CBC
Hemoglobin: 11.9 g/dL — ABNORMAL LOW (ref 13.0–17.0)
MCH: 31.6 pg (ref 26.0–34.0)
MCHC: 33.6 g/dL (ref 30.0–36.0)
RDW: 12.4 % (ref 11.5–15.5)

## 2012-07-16 LAB — BLOOD GAS, ARTERIAL
Bicarbonate: 25.5 mEq/L — ABNORMAL HIGH (ref 20.0–24.0)
FIO2: 0.21 %
O2 Saturation: 97.7 %
Patient temperature: 98.6
pH, Arterial: 7.401 (ref 7.350–7.450)

## 2012-07-16 LAB — URINALYSIS, ROUTINE W REFLEX MICROSCOPIC
Bilirubin Urine: NEGATIVE
Hgb urine dipstick: NEGATIVE
Ketones, ur: NEGATIVE mg/dL
Specific Gravity, Urine: 1.028 (ref 1.005–1.030)
Urobilinogen, UA: 1 mg/dL (ref 0.0–1.0)
pH: 7 (ref 5.0–8.0)

## 2012-07-16 LAB — APTT: aPTT: 33 seconds (ref 24–37)

## 2012-07-16 LAB — PROTIME-INR: Prothrombin Time: 13.8 seconds (ref 11.6–15.2)

## 2012-07-16 SURGERY — THORACIC DISCECTOMY
Anesthesia: General | Laterality: Right | Wound class: Clean

## 2012-07-16 MED ORDER — SODIUM CHLORIDE 0.9 % IV SOLN: 250.0000 mL | INTRAVENOUS | Status: DC

## 2012-07-16 MED ORDER — ALUM & MAG HYDROXIDE-SIMETH 200-200-20 MG/5ML PO SUSP: 30.0000 mL | Freq: Four times a day (QID) | ORAL | Status: DC | PRN

## 2012-07-16 MED ORDER — MENTHOL 3 MG MT LOZG: 1.0000 | LOZENGE | OROMUCOSAL | Status: DC | PRN

## 2012-07-16 MED ORDER — POTASSIUM CHLORIDE IN NACL 20-0.9 MEQ/L-% IV SOLN
INTRAVENOUS | Status: DC
  Administered 2012-07-16: 18:00:00 via INTRAVENOUS
  Filled 2012-07-16 (×6): qty 1000

## 2012-07-16 MED ORDER — ALBUMIN HUMAN 5 % IV SOLN
INTRAVENOUS | Status: DC | PRN
  Administered 2012-07-16: 13:00:00 via INTRAVENOUS

## 2012-07-16 MED ORDER — HYDROMORPHONE HCL PF 1 MG/ML IJ SOLN
0.2500 mg | INTRAMUSCULAR | Status: DC | PRN
  Administered 2012-07-16 (×3): 0.5 mg via INTRAVENOUS

## 2012-07-16 MED ORDER — BISACODYL 5 MG PO TBEC
5.0000 mg | DELAYED_RELEASE_TABLET | Freq: Every day | ORAL | Status: DC | PRN
  Filled 2012-07-16: qty 1

## 2012-07-16 MED ORDER — DEXAMETHASONE SODIUM PHOSPHATE 10 MG/ML IJ SOLN
10.0000 mg | Freq: Once | INTRAMUSCULAR | Status: AC
  Administered 2012-07-16: 10 mg via INTRAVENOUS
  Filled 2012-07-16: qty 1

## 2012-07-16 MED ORDER — HEMOSTATIC AGENTS (NO CHARGE) OPTIME
TOPICAL | Status: DC | PRN
  Administered 2012-07-16: 1 via TOPICAL

## 2012-07-16 MED ORDER — DEXTROSE 5 % IV SOLN
1.5000 g | INTRAVENOUS | Status: AC
  Administered 2012-07-16: 1.5 g via INTRAVENOUS
  Filled 2012-07-16: qty 1.5

## 2012-07-16 MED ORDER — LACTATED RINGERS IV SOLN
INTRAVENOUS | Status: DC | PRN
  Administered 2012-07-16 (×3): via INTRAVENOUS

## 2012-07-16 MED ORDER — ONDANSETRON HCL 4 MG/2ML IJ SOLN
INTRAMUSCULAR | Status: DC | PRN
  Administered 2012-07-16: 4 mg via INTRAVENOUS

## 2012-07-16 MED ORDER — SENNA 8.6 MG PO TABS
1.0000 | ORAL_TABLET | Freq: Two times a day (BID) | ORAL | Status: DC
  Administered 2012-07-16 – 2012-07-22 (×7): 8.6 mg via ORAL
  Filled 2012-07-16 (×13): qty 1

## 2012-07-16 MED ORDER — NEOSTIGMINE METHYLSULFATE 1 MG/ML IJ SOLN
INTRAMUSCULAR | Status: DC | PRN
  Administered 2012-07-16: 4 mg via INTRAVENOUS

## 2012-07-16 MED ORDER — VECURONIUM BROMIDE 10 MG IV SOLR
INTRAVENOUS | Status: DC | PRN
  Administered 2012-07-16: 3 mg via INTRAVENOUS
  Administered 2012-07-16: 4 mg via INTRAVENOUS
  Administered 2012-07-16: 3 mg via INTRAVENOUS

## 2012-07-16 MED ORDER — SODIUM CHLORIDE 0.9 % IV SOLN
10.0000 mg | INTRAVENOUS | Status: DC | PRN
  Administered 2012-07-16: 10 ug/min via INTRAVENOUS

## 2012-07-16 MED ORDER — DEXAMETHASONE 4 MG PO TABS
4.0000 mg | ORAL_TABLET | Freq: Four times a day (QID) | ORAL | Status: DC
  Administered 2012-07-17 – 2012-07-18 (×6): 4 mg via ORAL
  Filled 2012-07-16 (×12): qty 1

## 2012-07-16 MED ORDER — LIDOCAINE HCL (CARDIAC) 20 MG/ML IV SOLN
INTRAVENOUS | Status: DC | PRN
  Administered 2012-07-16: 100 mg via INTRAVENOUS

## 2012-07-16 MED ORDER — MIDAZOLAM HCL 5 MG/5ML IJ SOLN
INTRAMUSCULAR | Status: DC | PRN
  Administered 2012-07-16: 2 mg via INTRAVENOUS

## 2012-07-16 MED ORDER — THROMBIN 5000 UNITS EX SOLR
CUTANEOUS | Status: DC | PRN
  Administered 2012-07-16 (×2): 5000 [IU] via TOPICAL

## 2012-07-16 MED ORDER — DIAZEPAM 5 MG PO TABS
5.0000 mg | ORAL_TABLET | Freq: Four times a day (QID) | ORAL | Status: DC | PRN
  Administered 2012-07-18 – 2012-07-21 (×7): 5 mg via ORAL
  Filled 2012-07-16 (×7): qty 1

## 2012-07-16 MED ORDER — OXYCODONE HCL 5 MG PO TABS
5.0000 mg | ORAL_TABLET | ORAL | Status: DC | PRN
  Administered 2012-07-16 – 2012-07-22 (×26): 10 mg via ORAL
  Filled 2012-07-16 (×27): qty 2

## 2012-07-16 MED ORDER — SODIUM CHLORIDE 0.9 % IJ SOLN
3.0000 mL | Freq: Two times a day (BID) | INTRAMUSCULAR | Status: DC
  Administered 2012-07-16 – 2012-07-18 (×3): 3 mL via INTRAVENOUS

## 2012-07-16 MED ORDER — POLYETHYLENE GLYCOL 3350 17 G PO PACK
17.0000 g | PACK | Freq: Every day | ORAL | Status: DC | PRN
  Filled 2012-07-16: qty 1

## 2012-07-16 MED ORDER — ALBUTEROL SULFATE HFA 108 (90 BASE) MCG/ACT IN AERS
INHALATION_SPRAY | RESPIRATORY_TRACT | Status: DC | PRN
  Administered 2012-07-16: 4 via RESPIRATORY_TRACT

## 2012-07-16 MED ORDER — LACTATED RINGERS IV SOLN
INTRAVENOUS | Status: DC | PRN
  Administered 2012-07-16: 09:00:00 via INTRAVENOUS

## 2012-07-16 MED ORDER — HYDROMORPHONE HCL PF 1 MG/ML IJ SOLN
INTRAMUSCULAR | Status: AC
  Administered 2012-07-16: 0.5 mg via INTRAVENOUS
  Filled 2012-07-16: qty 1

## 2012-07-16 MED ORDER — ACETAMINOPHEN 10 MG/ML IV SOLN
INTRAVENOUS | Status: AC
  Administered 2012-07-16: 1000 mg via INTRAVENOUS
  Filled 2012-07-16: qty 100

## 2012-07-16 MED ORDER — ACETAMINOPHEN 10 MG/ML IV SOLN
1000.0000 mg | Freq: Four times a day (QID) | INTRAVENOUS | Status: DC
  Administered 2012-07-16: 1000 mg via INTRAVENOUS
  Filled 2012-07-16 (×3): qty 100

## 2012-07-16 MED ORDER — MORPHINE SULFATE 2 MG/ML IJ SOLN
1.0000 mg | INTRAMUSCULAR | Status: DC | PRN
  Administered 2012-07-16: 4 mg via INTRAVENOUS
  Administered 2012-07-16: 2 mg via INTRAVENOUS
  Administered 2012-07-17 (×2): 4 mg via INTRAVENOUS
  Filled 2012-07-16: qty 2
  Filled 2012-07-16: qty 1
  Filled 2012-07-16 (×2): qty 2

## 2012-07-16 MED ORDER — PHENYLEPHRINE HCL 10 MG/ML IJ SOLN
INTRAMUSCULAR | Status: DC | PRN
  Administered 2012-07-16 (×3): 80 ug via INTRAVENOUS

## 2012-07-16 MED ORDER — CEFAZOLIN SODIUM 1-5 GM-% IV SOLN
1.0000 g | Freq: Three times a day (TID) | INTRAVENOUS | Status: AC
  Administered 2012-07-16 – 2012-07-17 (×2): 1 g via INTRAVENOUS
  Filled 2012-07-16 (×2): qty 50

## 2012-07-16 MED ORDER — DEXAMETHASONE SODIUM PHOSPHATE 4 MG/ML IJ SOLN
4.0000 mg | Freq: Four times a day (QID) | INTRAMUSCULAR | Status: DC
  Administered 2012-07-16: 4 mg via INTRAVENOUS
  Filled 2012-07-16 (×9): qty 1

## 2012-07-16 MED ORDER — GLYCOPYRROLATE 0.2 MG/ML IJ SOLN
INTRAMUSCULAR | Status: DC | PRN
  Administered 2012-07-16: 0.6 mg via INTRAVENOUS

## 2012-07-16 MED ORDER — PROPOFOL 10 MG/ML IV BOLUS
INTRAVENOUS | Status: DC | PRN
  Administered 2012-07-16: 200 mg via INTRAVENOUS
  Administered 2012-07-16: 50 mg via INTRAVENOUS

## 2012-07-16 MED ORDER — ONDANSETRON HCL 4 MG/2ML IJ SOLN
4.0000 mg | INTRAMUSCULAR | Status: DC | PRN
  Administered 2012-07-16: 4 mg via INTRAVENOUS
  Filled 2012-07-16: qty 2

## 2012-07-16 MED ORDER — SODIUM CHLORIDE 0.9 % IJ SOLN
3.0000 mL | INTRAMUSCULAR | Status: DC | PRN
  Administered 2012-07-18: 3 mL via INTRAVENOUS

## 2012-07-16 MED ORDER — MAGNESIUM CITRATE PO SOLN: 1.0000 | Freq: Once | ORAL | Status: AC | PRN

## 2012-07-16 MED ORDER — 0.9 % SODIUM CHLORIDE (POUR BTL) OPTIME
TOPICAL | Status: DC | PRN
  Administered 2012-07-16 (×2): 1000 mL

## 2012-07-16 MED ORDER — HEMOSTATIC AGENTS (NO CHARGE) OPTIME
TOPICAL | Status: DC | PRN
  Administered 2012-07-16 (×2): 1 via TOPICAL

## 2012-07-16 MED ORDER — PHENOL 1.4 % MT LIQD: 1.0000 | OROMUCOSAL | Status: DC | PRN

## 2012-07-16 MED ORDER — FENTANYL CITRATE 0.05 MG/ML IJ SOLN
INTRAMUSCULAR | Status: DC | PRN
  Administered 2012-07-16 (×4): 50 ug via INTRAVENOUS
  Administered 2012-07-16: 100 ug via INTRAVENOUS
  Administered 2012-07-16 (×3): 50 ug via INTRAVENOUS

## 2012-07-16 MED ORDER — ROCURONIUM BROMIDE 100 MG/10ML IV SOLN
INTRAVENOUS | Status: DC | PRN
  Administered 2012-07-16 (×2): 50 mg via INTRAVENOUS

## 2012-07-16 SURGICAL SUPPLY — 81 items
BAG DECANTER FOR FLEXI CONT (MISCELLANEOUS) IMPLANT
BENZOIN TINCTURE PRP APPL 2/3 (GAUZE/BANDAGES/DRESSINGS) ×3 IMPLANT
BLADE SURG ROTATE 9660 (MISCELLANEOUS) IMPLANT
BRUSH SCRUB EZ PLAIN DRY (MISCELLANEOUS) ×3 IMPLANT
BUR ACORN 6.0 (BURR) ×3 IMPLANT
BUR BARREL STRAIGHT FLUTE 4.0 (BURR) ×3 IMPLANT
BUR MATCHSTICK NEURO 3.0 LAGG (BURR) ×3 IMPLANT
BUR ROUND FLUTED 4 SOFT TCH (BURR) ×3 IMPLANT
CANISTER SUCTION 2500CC (MISCELLANEOUS) ×3 IMPLANT
CATH HYDRAGLIDE XL THORACIC (CATHETERS) IMPLANT
CATH THORACIC 28FR (CATHETERS) ×6 IMPLANT
CATH THORACIC 36FR (CATHETERS) IMPLANT
CLIP TI MEDIUM 6 (CLIP) ×6 IMPLANT
CLOTH BEACON ORANGE TIMEOUT ST (SAFETY) ×3 IMPLANT
CONT SPEC 4OZ CLIKSEAL STRL BL (MISCELLANEOUS) ×3 IMPLANT
DECANTER SPIKE VIAL GLASS SM (MISCELLANEOUS) IMPLANT
DERMABOND ADVANCED (GAUZE/BANDAGES/DRESSINGS) ×1
DERMABOND ADVANCED .7 DNX12 (GAUZE/BANDAGES/DRESSINGS) ×2 IMPLANT
DRAPE LAPAROTOMY 100X72X124 (DRAPES) ×3 IMPLANT
DRAPE MICROSCOPE LEICA (MISCELLANEOUS) IMPLANT
DRAPE POUCH INSTRU U-SHP 10X18 (DRAPES) ×3 IMPLANT
DRAPE SURG 17X23 STRL (DRAPES) ×12 IMPLANT
DRAPE WARM FLUID 44X44 (DRAPE) ×3 IMPLANT
DRILL BIT 7/64X5 (BIT) IMPLANT
ELECT BLADE 4.0 EZ CLEAN MEGAD (MISCELLANEOUS) ×3
ELECT REM PT RETURN 9FT ADLT (ELECTROSURGICAL) ×6
ELECTRODE BLDE 4.0 EZ CLN MEGD (MISCELLANEOUS) ×2 IMPLANT
ELECTRODE REM PT RTRN 9FT ADLT (ELECTROSURGICAL) ×4 IMPLANT
EUDERMIC SURGICAL GLOVE ×6 IMPLANT
GAUZE SPONGE 4X4 16PLY XRAY LF (GAUZE/BANDAGES/DRESSINGS) IMPLANT
GLOVE BIO SURGEON STRL SZ 6.5 (GLOVE) ×6 IMPLANT
GLOVE BIOGEL PI IND STRL 7.0 (GLOVE) ×4 IMPLANT
GLOVE BIOGEL PI INDICATOR 7.0 (GLOVE) ×2
GLOVE EXAM NITRILE LRG STRL (GLOVE) IMPLANT
GLOVE EXAM NITRILE MD LF STRL (GLOVE) ×6 IMPLANT
GLOVE EXAM NITRILE XL STR (GLOVE) IMPLANT
GLOVE EXAM NITRILE XS STR PU (GLOVE) IMPLANT
GLOVE SS BIOGEL STRL SZ 8 (GLOVE) ×2 IMPLANT
GLOVE SUPERSENSE BIOGEL SZ 8 (GLOVE) ×1
GOWN BRE IMP SLV AUR LG STRL (GOWN DISPOSABLE) ×18 IMPLANT
GOWN BRE IMP SLV AUR XL STRL (GOWN DISPOSABLE) ×6 IMPLANT
GOWN STRL REIN 2XL LVL4 (GOWN DISPOSABLE) ×3 IMPLANT
HEMOSTAT SURGICEL 2X14 (HEMOSTASIS) IMPLANT
KIT BASIN OR (CUSTOM PROCEDURE TRAY) ×3 IMPLANT
KIT ROOM TURNOVER OR (KITS) ×3 IMPLANT
NEEDLE HYPO 21X1.5 SAFETY (NEEDLE) IMPLANT
NEEDLE HYPO 22GX1.5 SAFETY (NEEDLE) ×3 IMPLANT
NS IRRIG 1000ML POUR BTL (IV SOLUTION) ×6 IMPLANT
PACK CHEST (CUSTOM PROCEDURE TRAY) ×3 IMPLANT
PACK LAMINECTOMY NEURO (CUSTOM PROCEDURE TRAY) ×3 IMPLANT
PAD ARMBOARD 7.5X6 YLW CONV (MISCELLANEOUS) ×15 IMPLANT
PAIN PUMP ON-Q 400MLX5ML 5IN (MISCELLANEOUS) IMPLANT
PATTIES SURGICAL .5 X1 (DISPOSABLE) IMPLANT
PROGEL AIR PLEURAL SEALANT ×3 IMPLANT
RUBBERBAND STERILE (MISCELLANEOUS) ×12 IMPLANT
SPONGE GAUZE 4X4 12PLY (GAUZE/BANDAGES/DRESSINGS) ×3 IMPLANT
SPONGE SURGIFOAM ABS GEL SZ50 (HEMOSTASIS) ×3 IMPLANT
STAPLER VISISTAT 35W (STAPLE) IMPLANT
STRIP CLOSURE SKIN 1/2X4 (GAUZE/BANDAGES/DRESSINGS) ×3 IMPLANT
SUT PROLENE 4 0 RB 1 (SUTURE) ×1
SUT PROLENE 4-0 RB1 .5 CRCL 36 (SUTURE) ×2 IMPLANT
SUT SILK  1 MH (SUTURE) ×2
SUT SILK 1 MH (SUTURE) ×4 IMPLANT
SUT SILK 3 0 SH 30 (SUTURE) ×24 IMPLANT
SUT VIC AB 1 CT1 18XBRD ANBCTR (SUTURE) IMPLANT
SUT VIC AB 1 CT1 8-18 (SUTURE)
SUT VIC AB 1 CTX 36 (SUTURE) ×2
SUT VIC AB 1 CTX36XBRD ANBCTR (SUTURE) ×4 IMPLANT
SUT VIC AB 2-0 CP2 18 (SUTURE) IMPLANT
SUT VIC AB 2-0 CTX 36 (SUTURE) ×6 IMPLANT
SUT VIC AB 3-0 X1 27 (SUTURE) ×3 IMPLANT
SUT VICRYL 2 TP 1 (SUTURE) ×12 IMPLANT
SYR 20CC LL (SYRINGE) IMPLANT
SYR 20ML ECCENTRIC (SYRINGE) ×3 IMPLANT
SYSTEM SAHARA CHEST DRAIN ATS (WOUND CARE) ×3 IMPLANT
TAPE CLOTH 4X10 WHT NS (GAUZE/BANDAGES/DRESSINGS) ×3 IMPLANT
TOWEL OR 17X24 6PK STRL BLUE (TOWEL DISPOSABLE) ×3 IMPLANT
TOWEL OR 17X26 10 PK STRL BLUE (TOWEL DISPOSABLE) ×6 IMPLANT
TRAY FOLEY CATH 14FRSI W/METER (CATHETERS) ×3 IMPLANT
TUNNELER SHEATH ON-Q 11GX8 (MISCELLANEOUS) IMPLANT
WATER STERILE IRR 1000ML POUR (IV SOLUTION) ×3 IMPLANT

## 2012-07-16 NOTE — Preoperative (Signed)
Beta Blockers   Reason not to administer Beta Blockers:Not Applicable 

## 2012-07-16 NOTE — Interval H&P Note (Signed)
History and Physical Interval Note:  07/16/2012 8:18 AM  Travis Fitzpatrick  has presented today for surgery, with the diagnosis of thoracic herniated disc  The various methods of treatment have been discussed with the patient and family. After consideration of risks, benefits and other options for treatment, the patient has consented to  Procedure(s) (LRB) with comments: THORACIC DISCECTOMY (Right) - RIGHT T78 thoracic diskectomy via thoracotomy by dr Laneta Simmers THORACOTOMY OPEN FOR SPINE SURGERY (N/A) as a surgical intervention .  The patient's history has been reviewed, patient examined, no change in status, stable for surgery.  I have reviewed the patient's chart and labs.  Questions were answered to the patient's satisfaction.     Travis Fitzpatrick

## 2012-07-16 NOTE — H&P (Signed)
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 is a long time patient whom I took to the operating room for a thoracic disc at T8/9. Post op he improved and did well for a prolonged period. He feels over the last year or so that he has regressed with his gait, strength, and bladder control. A myelogram revealed some pressure on the spinal cord on the right side. He has elected to undergo another thoracic discetomy at T7/8. Allergies  Allergen Reactions  . Aspirin Rash  . Other Itching    Apples,oranges, grapes  Throat itches   Past Medical History  Diagnosis Date  . Shortness of breath     w/exertion  . Urinary urgency   . Bowel trouble     urgency  . Chronic kidney disease     patient denies   Past Surgical History  Procedure Date  . Back surgery 2010  . Circumcision   . Lumbar laminectomy/decompression microdiscectomy 07/20/2011    Procedure: LUMBAR LAMINECTOMY/DECOMPRESSION MICRODISCECTOMY;  Surgeon: Carmela Hurt;  Location: [facility];  Service: Neurosurgery;  Laterality: N/A;  right thoracotomy with thoracic eight-nine discectomy and fusion  . Thoracotomy 07/20/2011    Procedure: THORACOTOMY OPEN FOR SPINE SURGERY;  Surgeon: Norton Blizzard, MD;  Location: [facility];  Service: Vascular;  Laterality: N/A;   History   Social History  . Marital Status: Single    Spouse Name: N/A    Number of Children: N/A  . Years of Education: N/A   Occupational History  . Not on file.   Social History Main Topics  . Smoking status: Never Smoker   . Smokeless tobacco: Never Used  . Alcohol Use: No  . Drug Use: No  . Sexually Active: No   Other Topics Concern  . Not on file   Social History Narrative  . No narrative on file   History reviewed. No pertinent family history. Prior to Admission medications   Medication Sig Start Date End Date Taking? Authorizing Provider  diclofenac (VOLTAREN) 50 MG EC tablet Take  50 mg by mouth 2 (two) times daily.   Yes Historical Provider, MD  ibuprofen (ADVIL,MOTRIN) 800 MG tablet Take 800 mg by mouth every 8 (eight) hours as needed. For pain   Yes Historical Provider, MD   Physical Exam  Constitutional: He is oriented to person, place, and time. He appears well-developed and well-nourished. No distress.  HENT:  Head: Normocephalic and atraumatic.  Right Ear: External ear normal.  Left Ear: External ear normal.  Nose: Nose normal.  Mouth/Throat: Oropharynx is clear and moist.  Eyes: Conjunctivae normal and EOM are normal. Pupils are equal, round, and reactive to light.  Neck: Normal range of motion. Neck supple.  Cardiovascular: Normal rate, regular rhythm, normal heart sounds and intact distal pulses.   Pulmonary/Chest: Effort normal and breath sounds normal.  Abdominal: Soft. Bowel sounds are normal.  Neurological: He is alert and oriented to person, place, and time. He displays abnormal reflex. No cranial nerve deficit. He exhibits abnormal muscle tone. Coordination and gait abnormal. He displays Babinski's sign on the right side. He displays Babinski's sign on the left side.  Reflex Scores:      Tricep reflexes are 2+ on the right side and 3+ on the left side.      Bicep reflexes are 2+ on the right side and 3+ on the left side.  Brachioradialis reflexes are 2+ on the right side and 3+ on the left side.      Patellar reflexes are 2+ on the right side and 3+ on the left side.      Achilles reflexes are 2+ on the right side and 3+ on the left side.      Spastic gait Romberg positive  Skin: Skin is warm and dry.  Psychiatric: He has a normal mood and affect. His behavior is normal. Thought content normal.   [name] understands the risks and benefits the chief being no change in his exam, paralysis of the lower extremities, decreased bowel bladder function, bleeding, infection, need for further surgery. Risks of the thoracotomy were discussed with Dr.  Laneta Simmers. He would like to proceed.

## 2012-07-16 NOTE — Anesthesia Postprocedure Evaluation (Signed)
  Anesthesia Post-op Note  Patient: Travis Fitzpatrick  Procedure(s) Performed: Procedure(s) (LRB) with comments: THORACIC DISCECTOMY (Right) - RIGHT Thoracic seven-eight  thoracic diskectomy via thoracotomy by dr Laneta Simmers THORACOTOMY OPEN FOR SPINE SURGERY (N/A)  Patient Location: PACU  Anesthesia Type:General  Level of Consciousness: awake  Airway and Oxygen Therapy: Patient Spontanous Breathing  Post-op Pain: mild  Post-op Assessment: Post-op Vital signs reviewed  Post-op Vital Signs: Reviewed  Complications: No apparent anesthesia complications

## 2012-07-16 NOTE — OR Nursing (Signed)
DIGITAL XRAY OBTAINED AT END OF CASE DR Pia Mau CALLED TO ROOM WITH XRAY REPORT, DR CABBELL MADE AWARE OF REPORT AND WENT TO RADIOLOGY TO CONFIRM.DR CABELL RETURN TO ROOM STATED OKAY TO BREAKDOWN

## 2012-07-16 NOTE — Anesthesia Preprocedure Evaluation (Addendum)
Anesthesia Evaluation  Patient identified by MRN, date of birth, ID band Patient awake    Reviewed: Allergy & Precautions, H&P , NPO status , Patient's Chart, lab work & pertinent test results  Airway Mallampati: II      Dental   Pulmonary shortness of breath,  breath sounds clear to auscultation        Cardiovascular Rhythm:Regular Rate:Normal     Neuro/Psych    GI/Hepatic   Endo/Other  Morbid obesity  Renal/GU      Musculoskeletal   Abdominal   Peds  Hematology   Anesthesia Other Findings   Reproductive/Obstetrics                          Anesthesia Physical Anesthesia Plan  ASA: III  Anesthesia Plan: General   Post-op Pain Management:    Induction: Intravenous  Airway Management Planned: Double Lumen EBT  Additional Equipment:   Intra-op Plan:   Post-operative Plan: Possible Post-op intubation/ventilation  Informed Consent: I have reviewed the patients History and Physical, chart, labs and discussed the procedure including the risks, benefits and alternatives for the proposed anesthesia with the patient or authorized representative who has indicated his/her understanding and acceptance.     Plan Discussed with: CRNA, Anesthesiologist and Surgeon  Anesthesia Plan Comments:         Anesthesia Quick Evaluation

## 2012-07-16 NOTE — Brief Op Note (Signed)
07/16/2012  2:58 PM  PATIENT:  Travis Fitzpatrick  31 y.o. male  PRE-OPERATIVE DIAGNOSIS:  thoracic herniated disc  POST-OPERATIVE DIAGNOSIS:  thoracic herniated disc  PROCEDURE:  Procedure(s) (LRB) with comments: Right Thoracotomy for spinal exposure  SURGEON:  Surgeon(s) and Role:    * Alleen Borne, MD - Primary  PHYSICIAN ASSISTANT: Coral Ceo, PA-C and Doree Fudge, PA-C  ASSISTANTS: none   ANESTHESIA:   general  EBL:  Total I/O In: 3050 [I.V.:2800; IV Piggyback:250] Out: 830 [Urine:530; Blood:300]  BLOOD ADMINISTERED:none  DRAINS: 2 28 F Chest Tube(s) in the right pleural space   LOCAL MEDICATIONS USED:  NONE  SPECIMEN:  No Specimen  DISPOSITION OF SPECIMEN:  N/A  COUNTS:  NO, chest xray performed  PLAN OF CARE: Admit to inpatient   PATIENT DISPOSITION:  PACU - hemodynamically stable.   Delay start of Pharmacological VTE agent (>24hrs) due to surgical blood loss or risk of bleeding: yes

## 2012-07-16 NOTE — Transfer of Care (Signed)
Immediate Anesthesia Transfer of Care Note  Patient: Travis Fitzpatrick  Procedure(s) Performed: Procedure(s) (LRB) with comments: THORACIC DISCECTOMY (Right) - RIGHT Thoracic seven-eight  thoracic diskectomy via thoracotomy by dr Laneta Simmers THORACOTOMY OPEN FOR SPINE SURGERY (N/A)  Patient Location: PACU  Anesthesia Type:General  Level of Consciousness: awake, sedated and patient cooperative  Airway & Oxygen Therapy: Patient Spontanous Breathing and Patient connected to face mask oxygen  Post-op Assessment: Report given to PACU RN, Post -op Vital signs reviewed and stable and Patient moving all extremities  Post vital signs: Reviewed and stable  Complications: No apparent anesthesia complications

## 2012-07-16 NOTE — Anesthesia Procedure Notes (Addendum)
Procedure Name: Intubation Date/Time: 07/16/2012 9:20 AM Performed by: Quentin Ore Pre-anesthesia Checklist: Patient identified, Emergency Drugs available, Suction available, Patient being monitored and Timeout performed Patient Re-evaluated:Patient Re-evaluated prior to inductionOxygen Delivery Method: Circle system utilized Preoxygenation: Pre-oxygenation with 100% oxygen Intubation Type: IV induction Ventilation: Mask ventilation without difficulty Laryngoscope Size: Mac and 4 Grade View: Grade I Tube type: Oral Endobronchial tube: Left, Double lumen EBT, EBT position confirmed by auscultation and EBT position confirmed by fiberoptic bronchoscope and 39 Fr Number of attempts: 1 Airway Equipment and Method: Stylet Placement Confirmation: ETT inserted through vocal cords under direct vision,  positive ETCO2 and breath sounds checked- equal and bilateral Tube secured with: Tape Dental Injury: Teeth and Oropharynx as per pre-operative assessment    RIJ CVP Dual Lumen: [id number]: The patient was identified and consent obtained.  TO was performed, and full barrier precautions were used.  The skin was anesthetized with lidocaine.  Once the vein was located with the 22 ga. needle using ultrasound guidance , the wire was inserted into the vein.  The wire location was confirmed with ultrasound.  The tissue was dilated and the catheter was carefully inserted, then sutured in place. A dressing was applied. The patient tolerated the procedure well.  CE

## 2012-07-16 NOTE — H&P (Signed)
[address]            [place] [address]          [phone]      PCP is Benita Stabile, MD Referring Provider is Carmela Hurt, MD    Chief Complaint   Patient presents with   .  Herniated Nucleus Propulsus       Referral from Dr Franky Macho for surgical eval Thoracic exposure        HPI: The patient is a 31 year old gentleman who underwent right thoracotomy for exposure of T8-9 by Dr. Edwyna Shell for discectomy in March of 2010. He subsequently underwent redo right thoracotomy for exposure of T8-9 on 07/20/2011 by Dr. Edwyna Shell for repeat discectomy and instrumentation. He now returns with complaints of urinary urgency and decreased sphincter control as well as weak anorectal sphincter control and weakness in his left knee. He has also had intermittent numbness in both legs and feet. A CT myelogram shows some spinal cord compression at T7-8 and Dr. Franky Macho is planning repeat spine surgery at that level.    Past Medical History   Diagnosis  Date   .  Shortness of breath         w/exertion   .  Chronic kidney disease     .  Urinary urgency     .  Bowel trouble         urgency         Past Surgical History   Procedure  Date   .  Back surgery  2010   .  Circumcision     .  Lumbar laminectomy/decompression microdiscectomy  07/20/2011       Procedure: LUMBAR LAMINECTOMY/DECOMPRESSION MICRODISCECTOMY;  Surgeon: Carmela Hurt;  Location: [facility];  Service: Neurosurgery;  Laterality: N/A;  right thoracotomy with thoracic eight-nine discectomy and fusion   .  Thoracotomy  07/20/2011       Procedure: THORACOTOMY OPEN FOR SPINE SURGERY;  Surgeon: Norton Blizzard, MD;  Location: [facility];  Service: Vascular;  Laterality: N/A;        History reviewed. No pertinent family history.   Social History History   Substance Use Topics   .  Smoking status:  Never Smoker    .  Smokeless tobacco:  Never Used   .  Alcohol Use:  No        Current Outpatient Prescriptions   Medication  Sig  Dispense  Refill   .  ibuprofen (ADVIL,MOTRIN) 800 MG tablet  Take 800 mg by mouth every 8 (eight) hours as needed. For pain               Allergies   Allergen  Reactions   .  Aspirin  Rash        Review of Systems  Constitutional: Positive for fatigue.  HENT: Negative.   Eyes: Negative.   Respiratory: Negative.   Cardiovascular: Negative.   Genitourinary: Positive for urgency and frequency.  Musculoskeletal: Positive for back pain and gait problem.  Skin: Negative.   Neurological: Positive for weakness and numbness.        Weakness in left knee   Numbness in both legs and feet  Hematological: Negative.   Psychiatric/Behavioral: The patient is nervous/anxious.         Recent stress due to his symptoms.      BP 130/75  Pulse 72  Resp 20  Ht 5\' 9"  (1.753 m)  Wt 275 lb (124.739 kg)  BMI 40.61 kg/m2  SpO2 98% Physical Exam  Constitutional: He is oriented to person, place, and time. He appears well-developed and well-nourished. No distress.       Obese gentleman  HENT:   Head: Normocephalic and atraumatic.   Mouth/Throat: Oropharynx is clear and moist.  Eyes: Conjunctivae normal and EOM are normal. Pupils are equal, round, and reactive to light.  Neck: Normal range of motion. Neck supple. No JVD present. No tracheal deviation present. No thyromegaly present.  Cardiovascular: Normal rate, regular rhythm, normal heart sounds and intact distal pulses.  Exam reveals no gallop and no friction rub.    No murmur heard. Pulmonary/Chest: Effort normal and breath sounds normal. No respiratory distress. He exhibits no tenderness.  Abdominal: Soft. Bowel sounds are normal. He exhibits no distension and no mass. There is no tenderness.  Musculoskeletal: Normal range of motion. He exhibits no edema.  Lymphadenopathy:    He has no cervical adenopathy.  Neurological: He is alert and oriented to person, place, and time.         Diagnostic Tests:   *RADIOLOGY REPORT*   Clinical Data:  Thoracic and lumbar pain.  Numbness of the lower extremities.  Previous T8-9 surgery.   MRI THORACIC AND LUMBAR SPINE WITHOUT AND WITH CONTRAST   Technique:  Multiplanar and multiecho pulse sequences of the thoracic and lumbar spine were obtained without and with intravenous contrast.   Contrast: 20mL MULTIHANCE GADOBENATE DIMEGLUMINE 529 MG/ML IV SOLN   Comparison:  Lumbar MRI 03/07/2008.  Operative radiographs March 2010.  Thoracic radiographs 04/17/2012.  CT thoracic spine 06/20/2011.   MRI THORACIC SPINE   Findings: There is no significant finding at T6-7 or above.   T7-8 shows a right paracentral disc herniation that indents the thecal sac.  There is extensive cord atrophy/myelomalacia in the region from T7-T9.   At T8-9, there has been previous fusion.  There is wide patency of the canal.   T9-10 disc bulges mildly.  T10-11, T11-12 T12-L1 are normal.   IMPRESSION: Previous fusion and decompression at T8-9.  Wide patency of the canal and foramina.  Pronounced cord atrophy/myelomalacia from T7- T9.   Central to right-sided disc herniation at T7-8 indents the thecal sac but does not appear to compress the neural structures.  This appears the same as on the preoperative study.   MRI LUMBAR SPINE   Findings: There is no abnormality at L1-2, L2-3 or L3-4.   At L4-5, the disc shows desiccation and mild bulging.  There is mild facet ligamentous hypertrophy.  No compressive stenosis.   At L5-S1, the disc shows desiccation and bulging.  There is mild facet and ligamentous hypertrophy.  There is a superior end plate Schmorl's node on the right at the sacrum with mild edema and enhancement that could relate to back pain.  There is annular tearing but no herniated nuclear material.   IMPRESSION: L4-5:  Desiccation and bulging of the disc.  Mild facet hypertrophy.  No stenosis.   L5 S1:  Annular  tearing annular bulging.  No compressive stenosis. Superior end plate Schmorl's node at S1 could be associated with pain.     Original Report Authenticated By: Thomasenia Sales, M.D. *RADIOLOGY REPORT*   Clinical Data:  Back pain with myelopathy.  Right hip pain extending to the knee.   MYELOGRAM  THORACIC AND LUMBAR   Technique: Lumbar puncture and intrathecal  contrast administration were performed by Dr. Franky Macho who will separately report for this portion of the procedure.  I personally supervised acquisition of the myelogram images. Following injection of intrathecal Omnipaque contrast, spine imaging in multiple projections was performed using fluoroscopy.   Comparison: MRI lumbar and thoracic spine 04/26/2012. Multiple prior studies including lumbar MRI from 03/07/2008.  Operative radiographs of March 2010.  And CT thoracic spine 06/20/2011.   Findings: Lumbar puncture was performed at L4-5 using a 5-inch needle via a left paramedian approach.  Initially attempt at L3-4 was unsuccessful due to the patient's body habitus.   There is slight narrowing of the thecal sac diffusely without focal extradural defect.  This may relate to congenitally short pedicles. No lumbar nerve root cut off is seen.  There is no vertebral subluxation or visible focal disc protrusion.  Some contrast extends into the paraspinous soft tissues on the left at L3-4  from previous attempt at myelography with a shorter needle.   Contrast was maneuvered into the thoracic region where there was no block.  There is previous discectomy and partial corpectomy with strut graft at T8-9 from a right transthoracic approach.  There is a ventral tissue defect at T7-8 which displaces the subarachnoid space and cord to a moderate degree.   Fluoroscopy Time: 2.13 minutes   IMPRESSION: As above.   CT MYELOGRAPHY THORACIC SPINE   Technique: CT imaging of the thoracic spine was performed after intrathecal  contrast administration.  Multiplanar CT image reconstructions were also generated.   Findings:  The disc spaces from T1-T7 are unremarkable.   At T7-T8, there is a right paramedian central protrusion which moderately flattens the cord and effaces the anterior subarachnoid space.  This is similar to recent priors, specifically 04/26/2012. Superimposed cord atrophy could partially account for the small size of the cord at this level, but there does appear to be significant mass of a ventrally.   At T8-9, there is satisfactory decompression of the spinal canal and cord without residual compression.  Hardware and interbody graft appears satisfactory.   The remaining thoracic disc spaces are unremarkable.   IMPRESSION: Satisfactory appearance status post T8-9 discectomy.   Central and rightward protrusion at T7-8 without spinal block. There is moderate cord flattening at this level, similar to the prior MR of 04/26/2012.   CT MYELOGRAPHY LUMBAR SPINE   Technique: CT imaging of the lumbar spine was performed after intrathecal contrast administration.  Multiplanar CT image reconstructions were also generated.   Findings:  There are no prevertebral or paraspinous masses.   There is mild generalized narrowing the spinal canal relative to short pedicles but no focal disc protrusion or nerve root cut off. There is no significant epidural lipomatosis.  Mild annular bulging at L5-S1 and bilateral L4-5 and L5-S1 facet arthropathy is noted.   Fairly large Schmorl's nodes projecting superiorly from L4-5 could serve as a focus of back pain, as could a smaller Schmorl's node projecting inferiorly from L5-S1.   Compared with prior MR, there is similar appearance.   IMPRESSION: No lumbar disc protrusion or significant spinal stenosis.  See comments above.     Original Report Authenticated By: Elsie Stain, M.D.    Impression:   He has some degree of spinal cord compression at  T7-8 and it is felt this could be contributing to his symptoms. He will require exposure of T7-8 for spinal surgery by Dr. Franky Macho. This will be his third thoracotomy and could be complicated due to adhesions  related to his 2 prior surgeries. He'll also be difficult due to his obesity. I discussed the operative procedure with the patient including alternatives, benefits, and risks including but not limited to bleeding, blood transfusion, injury to the lung, prolonged air leak from the lung,  infection, injury to the thoracic duct with chylothorax, and injury to the esophagus. He understands all this and would like to proceed with surgery.   Plan:   We will coordinate his surgery with Dr. Franky Macho. He will require right thoracotomy for spinal exposure.

## 2012-07-16 NOTE — Op Note (Signed)
07/16/2012  2:13 PM  PATIENT:  Travis Fitzpatrick  31 y.o. 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 was brought to the operating room intubated and placed under a general anesthetic without difficulty. He was positioned in a lateral decubitus position with his right side up. At this time Dr. Laneta Simmers performed the thoracotomy exposing the T7/T8 disc space on the right side.  Using the previous plate which I had placed on T8/9 I knew I was in the correct position. I opened the disc space with a 15 blade and started the decompression. I used the drill to expand my exposure in the disc space. I was able to expose the thecal sac at the disc space. With microdissection and Dr. Lonie Peak assistance we were able to decompress the canal and spinal cord using Kerrison punches, epstein curettes, and microdissectors. I was able to get completely around the cord anteriorly. I did shoot an xray to show that I did cross the midline in my decompression. We had good hemostasis. At this time Dr. Laneta Simmers returned for closure.   PLAN OF CARE: Admit to inpatient   PATIENT DISPOSITION:  PACU - hemodynamically stable.   Delay start of Pharmacological VTE agent (>24hrs) due to surgical blood loss or risk of bleeding:  yes

## 2012-07-17 LAB — POCT I-STAT 7, (LYTES, BLD GAS, ICA,H+H)
Bicarbonate: 25 mEq/L — ABNORMAL HIGH (ref 20.0–24.0)
HCT: 33 % — ABNORMAL LOW (ref 39.0–52.0)
Hemoglobin: 11.2 g/dL — ABNORMAL LOW (ref 13.0–17.0)
Patient temperature: 35.3
TCO2: 26 mmol/L (ref 0–100)
pCO2 arterial: 39.3 mmHg (ref 35.0–45.0)
pCO2 arterial: 34.7 mmHg — ABNORMAL LOW (ref 35.0–45.0)
pH, Arterial: 7.403 (ref 7.350–7.450)
pH, Arterial: 7.45 (ref 7.350–7.450)
pO2, Arterial: 46 mmHg — ABNORMAL LOW (ref 80.0–100.0)
pO2, Arterial: 82 mmHg (ref 80.0–100.0)

## 2012-07-17 MED ORDER — DIPHENHYDRAMINE HCL 25 MG PO CAPS
25.0000 mg | ORAL_CAPSULE | Freq: Four times a day (QID) | ORAL | Status: DC | PRN
  Administered 2012-07-17 – 2012-07-18 (×3): 25 mg via ORAL
  Filled 2012-07-17 (×3): qty 1

## 2012-07-17 MED ORDER — ACETAMINOPHEN 325 MG PO TABS
975.0000 mg | ORAL_TABLET | Freq: Four times a day (QID) | ORAL | Status: AC
  Administered 2012-07-17 (×3): 975 mg via ORAL
  Filled 2012-07-17: qty 3
  Filled 2012-07-17: qty 1
  Filled 2012-07-17: qty 3

## 2012-07-17 NOTE — Clinical Social Work Note (Signed)
Clinical Social Worker received referral for possible ST-SNF placement.  Chart reviewed.  PT/OT recommending home with home health.  Spoke with RN Case Manager who will follow up with patient to discuss home health needs.    CSW signing off - please re consult if social work needs arise.  Jesse Akyra Bouchie, LCSW [phone] 

## 2012-07-17 NOTE — Progress Notes (Addendum)
                   [address]            [place] [address]          [phone]    1 Day Post-Op Procedure(s) (LRB): THORACIC DISCECTOMY (Right) THORACOTOMY OPEN FOR SPINE SURGERY (N/A)  Subjective: Patient awake and alert.  Objective: Vital signs in last 24 hours: Temp:  [96.8 F (36 C)-98.9 F (37.2 C)] 97.9 F (36.6 C) (12/05 0700) Pulse Rate:  [75-91] 91  (12/05 0900) Cardiac Rhythm:  [-] Normal sinus rhythm (12/05 0800) Resp:  [9-22] 18  (12/05 0900) BP: (101-127)/(48-100) 101/68 mmHg (12/05 0900) SpO2:  [97 %-100 %] 99 % (12/05 0900) Arterial Line BP: (106-173)/(61-85) 148/65 mmHg (12/05 0900)     Intake/Output from previous day: 12/04 0701 - 12/05 0700 In: 4211.7 [I.V.:3909.7; IV Piggyback:302] Out: 3905 [Urine:3305; Blood:300; Chest Tube:300]   Physical Exam:  Cardiovascular: RRR Pulmonary:Diminished at bases; no rales, wheezes, or rhonchi. Abdomen: Soft, non tender, bowel sounds present. Extremities: SCDs in place Wounds: Dressing is clean and dry. Chest Tubes: to suction  Lab Results: CBC: Basename 07/16/12 1140 07/16/12 1043 07/16/12 0715  WBC -- -- 4.7  HGB 11.2* 11.6* --  HCT 33.0* 34.0* --  PLT -- -- 163   BMET:  Basename 07/16/12 1140 07/16/12 1043  NA 138 139  K 3.7 3.8  CL -- --  CO2 -- --  GLUCOSE -- --  BUN -- --  CREATININE -- --  CALCIUM -- --    PT/INR:  Basename 07/16/12 0715  LABPROT 13.8  INR 1.07   ABG:  INR: Will add last result for INR, ABG once components are confirmed Will add last 4 CBG results once components are confirmed  Assessment/Plan:  1. CV - SR 2.  Pulmonary - Chest tubes with 300 cc of output last 24 hours. Chest tubes with some tidling with cough, but no obvious air leak.CXR this am shows no pneumothorax, some vascular congestion, and atelectasis. Consider placing to water seal.Encourage incentive spirometer and flutter valve. 3.Remove a line, if ok with Dr.  Alferd Apa MPA-C 07/17/2012,10:44 AM   He is doing well. Says his leg feels better. Pain about 5. Will put chest tubes to water seal and plan to remove in am if drainage low.

## 2012-07-17 NOTE — Progress Notes (Signed)
UR COMPLETED  

## 2012-07-17 NOTE — Evaluation (Signed)
Occupational Therapy Evaluation Patient Details Name: MATHHEW BUYSSE MRN: [id number] DOB: [birth year] Today's Date: 07/17/2012 Time: 4098-1191 OT Time Calculation (min): 31 min  OT Assessment / Plan / Recommendation Clinical Impression  Pt s/p thoracotomy for spinal exposure for thoracic discetomy at T7/8. Pt very motivated to work with thearpy- anticipate he will progress well enough acutely to d/c home with 24hr assist as well as HHOT. Will follow acutely to maximize I with ADL and ADL mobility prior to d/c.    OT Assessment  Patient needs continued OT Services    Follow Up Recommendations  Home health OT;Supervision/Assistance - 24 hour    Barriers to Discharge      Equipment Recommendations  None recommended by OT;None recommended by PT    Recommendations for Other Services    Frequency  Min 2X/week    Precautions / Restrictions Precautions Precautions: Fall;Back Precaution Comments: Reviewed back precautions. Required Braces or Orthoses:  (no brace)   Pertinent Vitals/Pain Pt reports 7/10 back pain; RN aware and pt is premedicated    ADL  Grooming: Wash/dry face;Supervision/safety;Set up Where Assessed - Grooming: Unsupported sitting Lower Body Bathing: Maximal assistance Where Assessed - Lower Body Bathing: Supported sit to stand Upper Body Dressing: Moderate assistance Where Assessed - Upper Body Dressing: Unsupported sitting Lower Body Dressing: +1 Total assistance Where Assessed - Lower Body Dressing: Supported sit to stand Toilet Transfer: +2 Total assistance Toilet Transfer: Patient Percentage: 70% Toilet Transfer Method: Sit to Barista:  (from bed) Toileting - Clothing Manipulation and Hygiene: Maximal assistance Where Assessed - Engineer, mining and Hygiene: Standing Equipment Used: Gait belt;Rolling walker Transfers/Ambulation Related to ADLs: +2total(pt=70%) with RW ambulation; pt reports decreased sensation in left  foot ADL Comments: pt doing well- expect good progress. Will need AE and tub/shower transfer education    OT Diagnosis: Generalized weakness;Acute pain  OT Problem List: Decreased strength;Decreased activity tolerance;Impaired balance (sitting and/or standing);Decreased safety awareness;Decreased knowledge of use of DME or AE;Decreased knowledge of precautions;Pain OT Treatment Interventions: Self-care/ADL training;DME and/or AE instruction;Therapeutic activities;Patient/family education;Balance training   OT Goals Acute Rehab OT Goals OT Goal Formulation: With patient Time For Goal Achievement: 07/24/12 Potential to Achieve Goals: Good ADL Goals Pt Will Perform Grooming: Independently;Standing at sink ADL Goal: Grooming - Progress: Goal set today Pt Will Perform Upper Body Dressing: Independently;Sitting, bed;Sitting, chair ADL Goal: Upper Body Dressing - Progress: Goal set today Pt Will Perform Lower Body Dressing: with modified independence;Sit to stand from chair;Sit to stand from bed;with adaptive equipment ADL Goal: Lower Body Dressing - Progress: Goal set today Pt Will Transfer to Toilet: with modified independence;Ambulation;with DME ADL Goal: Toilet Transfer - Progress: Goal set today Pt Will Perform Toileting - Clothing Manipulation: Independently;Standing ADL Goal: Toileting - Clothing Manipulation - Progress: Goal set today Pt Will Perform Toileting - Hygiene: with modified independence;with adaptive equipment;Sit to stand from 3-in-1/toilet ADL Goal: Toileting - Hygiene - Progress: Goal set today Pt Will Perform Tub/Shower Transfer: Tub transfer;with supervision;Ambulation;with DME;Shower seat with back ADL Goal: Web designer - Progress: Goal set today Additional ADL Goal #1: Pt will I'ly verbalize/generalize 3/3 back precautions in prep for OOB activities. ADL Goal: Additional Goal #1 - Progress: Goal set today  Visit Information  Last OT Received On:  07/17/12 Assistance Needed: +2 (lines) PT/OT Co-Evaluation/Treatment: Yes    Subjective Data  Subjective: I tried to sit up in my bed earlier and didn't last too long. Patient Stated Goal: Return home   Prior Functioning  Home Living Lives With: Family Available Help at Discharge: Available 24 hours/day Type of Home: House Home Access: Stairs to enter [employer] of Steps: 1 (platform step 4 inch) Entrance Stairs-Rails: None Home Layout: One level Bathroom Shower/Tub: Forensic scientist: Standard Bathroom Accessibility: Yes How Accessible: Accessible via walker (sideways) Home Adaptive Equipment: Walker - rolling;Straight cane;Bedside commode/3-in-1;Hand-held shower hose Prior Function Level of Independence: Independent Able to Take Stairs?: Yes Driving: Yes Vocation: On disability Communication Communication: No difficulties Dominant Hand: Right         Vision/Perception     Cognition  Overall Cognitive Status: Appears within functional limits for tasks assessed/performed Arousal/Alertness: Awake/alert Orientation Level: Appears intact for tasks assessed Behavior During Session: [facility] for tasks performed    Extremity/Trunk Assessment Right Upper Extremity Assessment RUE ROM/Strength/Tone: WFL for tasks assessed RUE Sensation: WFL - Light Touch RUE Coordination: WFL - gross/fine motor Left Upper Extremity Assessment LUE ROM/Strength/Tone: WFL for tasks assessed LUE Sensation: WFL - Light Touch LUE Coordination: WFL - gross/fine motor     Mobility Bed Mobility Bed Mobility: Supine to Sit;Sitting - Scoot to Edge of Bed Supine to Sit: HOB elevated;With rails;4: Min assist Sitting - Scoot to Edge of Bed: With rail;4: Min assist Details for Bed Mobility Assistance: (A) to elevate trunk and assist with hips to EOB.  Pt needs extra time to complete due to pain.  Used bed to assist pt OOB with HOB elevated to 60  degrees. Transfers Sit to Stand: 1: +2 Total assist;From bed Sit to Stand: Patient Percentage: 70% Stand to Sit: 1: +2 Total assist;To chair/3-in-1 Stand to Sit: Patient Percentage: 70% Details for Transfer Assistance: +2 (A) to initiate transfer and slowly descend to chair with cues for hand placement     Shoulder Instructions     Exercise     Balance Balance Balance Assessed: Yes Static Sitting Balance Static Sitting - Balance Support: Feet supported Static Sitting - Level of Assistance: 5: Stand by assistance   End of Session OT - End of Session Equipment Utilized During Treatment: Gait belt Activity Tolerance: Patient limited by pain Patient left: in chair;with call bell/phone within reach;with family/visitor present Nurse Communication: Mobility status  GO     Renato Spellman 07/17/2012, 3:08 PM

## 2012-07-17 NOTE — Op Note (Signed)
NAMELATRELL, POTEMPA NO.:  [id number]  MEDICAL RECORD NO.:  [id number]  LOCATION:  3102                         FACILITY:  [facility]  PHYSICIAN:  Evelene Croon, M.D.     DATE OF BIRTH:  Jan 03, 1981  DATE OF PROCEDURE:  07/16/2012 DATE OF DISCHARGE:                              OPERATIVE REPORT   PREOPERATIVE DIAGNOSIS:  Thoracic myelopathy.  POSTOPERATIVE DIAGNOSIS:  Thoracic myelopathy.  OPERATIVE PROCEDURE:  Right posterior thoracotomy for spinal exposure.  ATTENDING SURGEON:  Evelene Croon, M.D.  ASSISTANT:  Coral Ceo, PA-C and Doree Fudge, [place]  ANESTHESIA:  General endotracheal.  CLINICAL HISTORY:  This patient is a 30 year old gentleman who underwent right thoracotomy for exposure of T8-9 by Dr. Edwyna Shell for diskectomy in March of 2010.  He subsequently underwent redo right thoracotomy for exposure of T8-9 on July 20, 2011, by Dr. Edwyna Shell for repeat diskectomy and instrumentation.  He now returns with complaints of urinary urgency and decreased sphincter control as well as a weak anorectal sphincter and weakness in his left knee.  He has also had some intermittent numbness in both legs and feet.  CT myelogram showed some spinal cord compression at T7-T8, and Dr. Franky Macho is planning repeat spine surgery at this level.  I was requested to provide spinal exposure through a right posterior thoracotomy incision.  I saw the patient in the office preoperatively and after discussion with him and reviewed of his films, we decided to proceed with surgery.  I discussed the operative procedure of right thoracotomy for spinal exposure including the increased difficulty due to two previous thoracotomies.  I discussed alternatives, benefits, and risks including, but not limited to, bleeding, infection, injury to the lung, prolonged air leak, chylothorax, and he understood and agreed to proceed.  OPERATIVE PROCEDURE:  The patient was seen in the preoperative  holding area, and the proper patient, proper operation, and proper operative side were confirmed after reviewing the chart and CT scan, and discussed with the patient.  The right side of the chest was signed by me.  He was given preoperative intravenous antibiotics.  He was taken back to the operating room and placed on the table in supine position.  After induction of general endotracheal anesthesia using a double-lumen tube, a Foley catheter was placed in the bladder using sterile technique. Lower extremity sequential compression devices were used.  The right side of the chest was prepped with Betadine soap and solution and draped in usual sterile manner.  Time-out was taken.  Proper patient, proper operation, and proper operative side were confirmed with nursing and anesthesia staff.  Then, the right posterolateral thoracotomy incision was made along the old scar.  This carried down through the subcutaneous tissue using electrocautery.  The latissimus and serratus muscles appeared essentially fused together and were divided using electrocautery.  The seventh intercostal space was easily located.  This interspace was opened using electrocautery.  The lung was adherent to the chest wall laterally and was able to separate this from the chest wall using careful retraction and electrocautery to divide the adhesions.  I removed as much as of the upper lobe from the chest wall as necessary approximately up  to the fifth intercostal space.  The lower lobe was also adherent to the diaphragm and posterior chest wall, and this was carefully separated from the diaphragm and chest wall using electrocautery.  This allowed retraction of the lung anteriorly and superiorly.  The spine was easily located.  The old hardware was identified between T8 and T9.  Then the pleura was opened over the spine and the segmental vessels in the sixth and seventh intercostal space were identified.  They were carefully  encircled and then suture ligated proximally and distally and divided.  This allowed the pleura to be separated off of the spinal bodies medially and laterally.  At this point, exposure was adequate for the spinal portion of procedure that was performed by Dr. Franky Macho.  After he completed his part of the procedure, I was called back to the operating room for closure.  There was good hemostasis.  There were couple of areas on the lung where the lung was abraded where it was brought off the chest wall.  These areas were coated with ProGel to try to seal any air leaks.  Then, two 28- French chest tubes were brought through separate stab incisions, one positioned posteriorly and one anteriorly.  The ribs were then reapproximated with #2 Vicryl pericostal sutures, and this required six sutures.  The lung was reinflated under direct vision.  Then the muscles were reapproximated in a single layer using continuous #1 Vicryl suture. The subcutaneous tissue was reapproximated using continuous 2-0 Vicryl and the skin with a 3-0 Vicryl subcuticular closure.  The sponge, needle, and instrument counts were apparently not performed and therefore, a chest x-ray was being performed in the operating room.  The patient was then turned into the supine position and extubated and transported to the postanesthesia care unit in satisfactory and stable condition.     Evelene Croon, M.D.     BB/MEDQ  D:  07/16/2012  T:  07/17/2012  Job:  [id number]

## 2012-07-17 NOTE — Evaluation (Signed)
Physical Therapy Evaluation Patient Details Name: Travis Fitzpatrick MRN: [id number] DOB: [birth year] Today's Date: 07/17/2012 Time: 4010-2725 PT Time Calculation (min): 37 min  PT Assessment / Plan / Recommendation Clinical Impression  Pt is 31 y/o male admitted for s/p right thoracotomy T8-9 with thoracic herinated T7-8 [place] compression.  Pt with recent diskectomy T8-22 July 2011.  Pt moving well and willing work.  Pt will benefit from acute PT services to improve overall mobility and prepare for safe d/c home with family.    PT Assessment  Patient needs continued PT services    Follow Up Recommendations  Home health PT;Supervision - Intermittent    Does the patient have the potential to tolerate intense rehabilitation      Barriers to Discharge None      Equipment Recommendations  None recommended by PT    Recommendations for Other Services     Frequency Min 5X/week    Precautions / Restrictions Precautions Precautions: Fall;Back Precaution Comments: Reviewed back precautions. Required Braces or Orthoses:  (no brace)   Pertinent Vitals/Pain 7-8/10 back however premedicated       Mobility  Bed Mobility Bed Mobility: Supine to Sit;Sitting - Scoot to Edge of Bed Supine to Sit: HOB elevated;With rails;4: Min assist Sitting - Scoot to Edge of Bed: With rail;4: Min assist Details for Bed Mobility Assistance: (A) to elevate trunk and assist with hips to EOB.  Pt needs extra time to complete due to pain.  Used bed to assist pt OOB with HOB elevated to 60 degrees. Transfers Transfers: Sit to Stand;Stand to Sit Sit to Stand: 1: +2 Total assist;From bed Sit to Stand: Patient Percentage: 70% Stand to Sit: 1: +2 Total assist;To chair/3-in-1 Stand to Sit: Patient Percentage: 70% Details for Transfer Assistance: +2 (A) to initiate transfer and slowly descend to chair with cues for hand placement Ambulation/Gait Ambulation/Gait Assistance: 1: +2 Total assist Ambulation/Gait:  Patient Percentage: 70% Ambulation Distance (Feet): 25 Feet Assistive device: Rolling walker Ambulation/Gait Assistance Details: +2 (A) with lines and maintain balance with cues for RW placement.  Gait Pattern: Step-through pattern;Shuffle;Trunk flexed;Wide base of support;Decreased stance time - left Stairs: No    Shoulder Instructions     Exercises     PT Diagnosis: Difficulty walking;Abnormality of gait;Generalized weakness;Acute pain  PT Problem List: Decreased strength;Decreased activity tolerance;Decreased balance;Decreased mobility;Decreased knowledge of use of DME;Decreased knowledge of precautions;Pain PT Treatment Interventions: DME instruction;Gait training;Stair training;Functional mobility training;Therapeutic activities;Therapeutic exercise;Balance training;Patient/family education;Neuromuscular re-education   PT Goals Acute Rehab PT Goals PT Goal Formulation: With patient/family Time For Goal Achievement: 07/24/12 Potential to Achieve Goals: Good Pt will Roll Supine to Left Side: with modified independence PT Goal: Rolling Supine to Left Side - Progress: Goal set today Pt will go Supine/Side to Sit: with modified independence PT Goal: Supine/Side to Sit - Progress: Goal set today Pt will go Sit to Stand: with modified independence PT Goal: Sit to Stand - Progress: Goal set today Pt will go Stand to Sit: with modified independence PT Goal: Stand to Sit - Progress: Goal set today Pt will Ambulate: >150 feet;with modified independence;with rolling walker PT Goal: Ambulate - Progress: Goal set today Pt will Go Up / Down Stairs: 1-2 stairs;with modified independence;with least restrictive assistive device PT Goal: Up/Down Stairs - Progress: Goal set today  Visit Information  Last PT Received On: 07/17/12 Assistance Needed: +2 (lines) PT/OT Co-Evaluation/Treatment: Yes    Subjective Data  Subjective: "I just set up in in bed." Patient Stated Goal: To  return home with  family   Prior Functioning  Home Living Lives With: Family Available Help at Discharge: Available 24 hours/day Type of Home: House Home Access: Stairs to enter [employer] of Steps: 1 (platform step 4 inch) Entrance Stairs-Rails: None Home Layout: One level Bathroom Shower/Tub: Forensic scientist: Standard Bathroom Accessibility: Yes How Accessible: Accessible via walker (sideways) Home Adaptive Equipment: Walker - rolling;Straight cane;Bedside commode/3-in-1;Hand-held shower hose Prior Function Level of Independence: Independent Able to Take Stairs?: Yes Driving: Yes Vocation: On disability Communication Communication: No difficulties Dominant Hand: Right    Cognition  Overall Cognitive Status: Appears within functional limits for tasks assessed/performed Arousal/Alertness: Awake/alert Orientation Level: Appears intact for tasks assessed Behavior During Session: [facility] for tasks performed    Extremity/Trunk Assessment Right Lower Extremity Assessment RLE ROM/Strength/Tone: Unable to fully assess;Deficits RLE ROM/Strength/Tone Deficits: unable to fully assess however at least 3/5 gross RLE Sensation: Deficits RLE Sensation Deficits: Pt reports mild light touch impairment however pt had prior to sx and reports improvement since sx Left Lower Extremity Assessment LLE ROM/Strength/Tone: Unable to fully assess;Deficits LLE ROM/Strength/Tone Deficits: Pt reports left LE weakness prior to sx and recent falls.  Noticeable mild left knee buckling with ambulation. LLE Sensation: Deficits LLE Sensation Deficits: Pt reports mild light touch impairment however pt had prior to sx and reports improvement since sx   Balance Balance Balance Assessed: Yes Static Sitting Balance Static Sitting - Balance Support: Feet supported Static Sitting - Level of Assistance: 5: Stand by assistance  End of Session PT - End of Session Equipment Utilized During Treatment:  Gait belt (chest tube) Activity Tolerance: Patient tolerated treatment well Patient left: in chair;with call bell/phone within reach;with family/visitor present Nurse Communication: Mobility status;Precautions  GP     Travis Fitzpatrick 07/17/2012, 2:42 PM Jake Shark, PT DPT [phone]

## 2012-07-18 ENCOUNTER — Encounter (HOSPITAL_COMMUNITY): Payer: Self-pay | Admitting: Neurosurgery

## 2012-07-18 ENCOUNTER — Inpatient Hospital Stay (HOSPITAL_COMMUNITY): Payer: Medicare Other

## 2012-07-18 MED ORDER — DIPHENHYDRAMINE HCL 50 MG/ML IJ SOLN
25.0000 mg | Freq: Three times a day (TID) | INTRAMUSCULAR | Status: DC | PRN
  Administered 2012-07-18: 50 mg via INTRAVENOUS
  Filled 2012-07-18 (×2): qty 1

## 2012-07-18 NOTE — Progress Notes (Signed)
Occupational Therapy Treatment Patient Details Name: Travis Fitzpatrick MRN: [id number] DOB: 1981-03-02 Today's Date: 07/18/2012 Time: 0272-5366 OT Time Calculation (min): 25 min  OT Assessment / Plan / Recommendation Comments on Treatment Session Pt progressing with therapy. No AE needs. Will benefit from tub/shower transfer practice next session    Follow Up Recommendations  Home health OT;Supervision/Assistance - 24 hour    Barriers to Discharge       Equipment Recommendations  None recommended by OT;None recommended by PT    Recommendations for Other Services    Frequency     Plan      Precautions / Restrictions Precautions Precautions: Fall;Back Precaution Comments: Reviewed back precautions.   Pertinent Vitals/Pain Pt reports 6/10 back/surgical pain    ADL  Grooming: Teeth care;Wash/dry face;Min guard (bil knees buckled x1; pt caught self on sink) Where Assessed - Grooming: Supported standing Lower Body Dressing: Maximal assistance Where Assessed - Lower Body Dressing: Supported sit to Pharmacist, hospital: Curator Method: Sit to Barista: Bedside commode Toileting - Clothing Manipulation and Hygiene: Moderate assistance Where Assessed - Toileting Clothing Manipulation and Hygiene: Sit to stand from 3-in-1 or toilet Equipment Used: Gait belt;Rolling walker Transfers/Ambulation Related to ADLs: Min A with RW ambulation throughout room ADL Comments: Pt has reacher for LB dressing; declines AE education, stating family can provide assist as needed.     OT Diagnosis:    OT Problem List:   OT Treatment Interventions:     OT Goals Acute Rehab OT Goals Time For Goal Achievement: 07/24/12 ADL Goals Pt Will Perform Grooming: Independently;Standing at sink ADL Goal: Grooming - Progress: Progressing toward goals Pt Will Perform Lower Body Dressing: with modified independence;Sit to stand from chair;Sit to stand from  bed;with adaptive equipment ADL Goal: Lower Body Dressing - Progress: Progressing toward goals Pt Will Transfer to Toilet: with modified independence;Ambulation;with DME ADL Goal: Toilet Transfer - Progress: Progressing toward goals Additional ADL Goal #1: Pt will I'ly verbalize/generalize 3/3 back precautions in prep for OOB activities. ADL Goal: Additional Goal #1 - Progress: Progressing toward goals  Visit Information  Last OT Received On: 07/18/12 Assistance Needed: +2 (lines)    Subjective Data      Prior Functioning       Cognition  Overall Cognitive Status: Appears within functional limits for tasks assessed/performed Arousal/Alertness: Awake/alert Orientation Level: Appears intact for tasks assessed Behavior During Session: [facility] for tasks performed    Mobility  Shoulder Instructions Bed Mobility Bed Mobility: Not assessed Details for Bed Mobility Assistance: Pt. received sitting in chair Transfers Sit to Stand: 4: Min assist;With upper extremity assist;From chair/3-in-1 Stand to Sit: 4: Min assist;With upper extremity assist;With armrests;To chair/3-in-1 Details for Transfer Assistance: Pt. with min (A) to get to standing for safety and steadiness. VC for proper hand placement when standing to/sitting from RW.       Exercises      Balance Balance Balance Assessed: Yes Static Standing Balance Static Standing - Balance Support: Left upper extremity supported;During functional activity Static Standing - Level of Assistance: 4: Min assist Static Standing - Comment/# of Minutes: Pt. standing at sink during functional activity for ~5 minutes with min (A) for safety and steadiness. Pt. with Bil knee buckling X1 at which point he reported that he was pretty tired. Pt. used Bil UEs to self correct and maintain balance with some physical (A) around waist.   End of Session OT - End of Session Equipment Utilized During Treatment:  Gait belt Activity Tolerance: Patient  tolerated treatment well Patient left: in chair;with call bell/phone within reach;with family/visitor present Nurse Communication: Mobility status  GO     Jennene Downie 07/18/2012, 10:02 AM

## 2012-07-18 NOTE — Progress Notes (Signed)
Physical Therapy Treatment Patient Details Name: Travis Fitzpatrick MRN: [id number] DOB: 25-Sep-1980 Today's Date: 07/18/2012 Time: 4098-1191 PT Time Calculation (min): 20 min  PT Assessment / Plan / Recommendation Comments on Treatment Session  Pt. presents to be moving well with ambulation with RW and some physical (A). Pt. reporting of feeling weak in his Lt. LE with ambulation and had one instance of knee buckling while standing at sink during functional activity. Pt. will need to complete the stairs before d/cing to his next venue.     Follow Up Recommendations  Home health PT;Supervision - Intermittent     Does the patient have the potential to tolerate intense rehabilitation     Barriers to Discharge        Equipment Recommendations  None recommended by OT;None recommended by PT    Recommendations for Other Services    Frequency Min 5X/week   Plan      Precautions / Restrictions Precautions Precautions: Fall;Back Precaution Comments: Reviewed back precautions.   Pertinent Vitals/Pain Patient reports pain 5-6/10 around surgical site.    Mobility  Bed Mobility Bed Mobility: Not assessed Details for Bed Mobility Assistance: Pt. presented sitting in chair upon arrival to room. Transfers Transfers: Sit to Stand;Stand to Sit Sit to Stand: 4: Min assist;With upper extremity assist;From chair/3-in-1 Stand to Sit: 4: Min assist;With upper extremity assist;With armrests;To chair/3-in-1 Details for Transfer Assistance: Pt. with min (A) to get to standing for safety and steadiness. VC for proper hand placement when standing to/sitting from RW. Ambulation/Gait Ambulation/Gait Assistance: 4: Min assist Ambulation Distance (Feet): 150 Feet Assistive device: Rolling walker Ambulation/Gait Assistance Details: Pt. ambulated into hallway with RW with min (A) for safety and steadiness with pt. reporting that he felt weak in his Lt. LE. VC given to make sure pt. was clearing both feet with  gt. Gait Pattern: Step-through pattern;Decreased stride length;Shuffle Stairs: No Wheelchair Mobility Wheelchair Mobility: No      PT Goals Acute Rehab PT Goals PT Goal Formulation: With patient/family Time For Goal Achievement: 07/24/12 Potential to Achieve Goals: Good Pt will Roll Supine to Left Side: with modified independence Pt will go Supine/Side to Sit: with modified independence Pt will go Sit to Stand: with modified independence PT Goal: Sit to Stand - Progress: Progressing toward goal Pt will go Stand to Sit: with modified independence PT Goal: Stand to Sit - Progress: Progressing toward goal Pt will Ambulate: >150 feet;with modified independence;with rolling walker PT Goal: Ambulate - Progress: Progressing toward goal Pt will Go Up / Down Stairs: 1-2 stairs;with modified independence;with least restrictive assistive device  Visit Information  Last PT Received On: 07/18/12 Assistance Needed: +2 (For lines) PT/OT Co-Evaluation/Treatment: Yes    Subjective Data  Subjective: "I am doing  Patient Stated Goal: To return home with family   Cognition  Overall Cognitive Status: Appears within functional limits for tasks assessed/performed Arousal/Alertness: Awake/alert Orientation Level: Appears intact for tasks assessed Behavior During Session: [facility] for tasks performed    Balance  Balance Balance Assessed: Yes Static Standing Balance Static Standing - Balance Support: Left upper extremity supported;During functional activity Static Standing - Level of Assistance: 4: Min assist Static Standing - Comment/# of Minutes: Pt. standing at sink during functional activity for ~5 minutes with min (A) for safety and steadiness. Pt. with Bil knee buckling X1 at which point he reported that he was pretty tired. Pt. used Bil UEs to self correct and maintain balance with some physical (A) around waist.  End of  Session PT - End of Session Equipment Utilized During Treatment: Gait  belt Activity Tolerance: Patient tolerated treatment well Patient left: in chair;with call bell/phone within reach Nurse Communication: Mobility status   Mertie Clause, SPTA 07/18/2012, 9:24 AM

## 2012-07-18 NOTE — Progress Notes (Signed)
Patient ID: Travis Fitzpatrick, male   DOB: [birth year], 31 y.o.   MRN: [id number] BP 131/82  Pulse 110  Temp 99 F (37.2 C) (Oral)  Resp 23  Ht 5\' 9"  (1.753 m)  Wt 127.7 kg (281 lb 8.4 oz)  BMI 41.57 kg/m2  SpO2 98% Alert and oriented x 4 Moving lower extremities at baseline neurologic status Chest tube in place, may be removed tomorrow if drainage subsides Doing well overall.

## 2012-07-18 NOTE — Progress Notes (Signed)
Patient received from 3100 this evening, alert and oriented, vitals stable with a chest tube insitu on the the right side of the chest. Upon report the RN communicated tube will be d/c tomorrow. Will continue to monitor

## 2012-07-18 NOTE — Progress Notes (Signed)
Agree with PT treatment note.  Avayah Raffety, PT DPT [phone]  

## 2012-07-18 NOTE — Progress Notes (Addendum)
[address]            [place] [address]          [phone]     2 Days Post-Op Procedure(s) (LRB): THORACIC DISCECTOMY (Right) THORACOTOMY OPEN FOR SPINE SURGERY (N/A)  Subjective: Sore, but doing well.  Morphine stopped due to itching, but pain controlled with po meds. Walked with PT yesterday.   Objective: Vital signs in last 24 hours: Patient Vitals for the past 24 hrs:  BP Temp Temp src Pulse Resp SpO2 Height Weight  07/18/12 0700 126/67 mmHg - - 93  17  99 % - -  07/18/12 0600 120/67 mmHg - - 93  19  98 % - -  07/18/12 0500 114/62 mmHg - - 87  16  97 % - -  07/18/12 0400 109/65 mmHg 98.4 F (36.9 C) Oral 83  16  97 % - -  07/18/12 0300 118/59 mmHg - - 85  15  97 % - -  07/18/12 0200 117/66 mmHg - - 87  16  97 % - -  07/18/12 0100 118/65 mmHg - - 90  15  99 % - -  07/18/12 0000 108/60 mmHg 98.4 F (36.9 C) Oral 84  17  98 % - -  07/17/12 2300 118/53 mmHg - - 92  16  97 % - -  07/17/12 2200 111/62 mmHg - - 93  17  97 % - -  07/17/12 2100 126/70 mmHg - - 96  18  99 % - -  07/17/12 2000 130/57 mmHg - - 91  18  97 % - -  07/17/12 1953 - 99.3 F (37.4 C) Oral - - - - -  07/17/12 1900 135/68 mmHg - - 94  16  98 % - -  07/17/12 1800 119/63 mmHg - - 79  18  100 % 5\' 9"  (1.753 m) 281 lb 8.4 oz (127.7 kg)  07/17/12 1700 125/64 mmHg - - 87  19  99 % - -  07/17/12 1600 116/60 mmHg 98 F (36.7 C) Oral 84  18  100 % - -  07/17/12 1500 - - - 81  18  99 % - -  07/17/12 1400 - - - 89  17  99 % - -  07/17/12 1320 111/95 mmHg - - 93  19  99 % - -  07/17/12 1300 - 97.8 F (36.6 C) Oral 94  - 98 % - -  07/17/12 1200 - - - 90  17  98 % - -  07/17/12 1100 113/54 mmHg - - 87  - 99 % - -  07/17/12 1000 118/60 mmHg - - 91  13  99 % - -  07/17/12 0900 101/68 mmHg - - 91  18  99 % - -  07/17/12 0800 111/58 mmHg - - 80  19  97 % - -   Current Weight  07/17/12 281 lb 8.4 oz (127.7 kg)     Intake/Output from previous day: 12/05 0701 - 12/06  0700 In: 983 [P.O.:740; I.V.:243] Out: 3500 [Urine:3200; Chest Tube:300]    PHYSICAL EXAM:  Heart: RRR Lungs: Slightly diminished BS in left base Wound:Clean and dry    Lab Results: CBC: Basename 07/16/12 1140 07/16/12 1043 07/16/12 0715  WBC -- -- 4.7  HGB 11.2* 11.6* --  HCT 33.0* 34.0* --  PLT -- --  163   BMET:  Basename 07/16/12 1140 07/16/12 1043  NA 138 139  K 3.7 3.8  CL -- --  CO2 -- --  GLUCOSE -- --  BUN -- --  CREATININE -- --  CALCIUM -- --    PT/INR:  Basename 07/16/12 0715  LABPROT 13.8  INR 1.07   CXR stable, no obvious ptx   Assessment/Plan: S/P Procedure(s) (LRB): THORACIC DISCECTOMY (Right) THORACOTOMY OPEN FOR SPINE SURGERY (N/A) Chest tube output 300 cc/24 hours. CXR stable.  Hopefully can remove at least one CT today. Continue pulm toilet, IS. Mobilize per NS.   LOS: 2 days    COLLINS,GINA H 07/18/2012    Chart reviewed, patient examined, agree with above. Will leave both tubes in today and remove both tomorrow.

## 2012-07-19 ENCOUNTER — Inpatient Hospital Stay (HOSPITAL_COMMUNITY): Payer: Medicare Other

## 2012-07-19 MED ORDER — FAMOTIDINE 20 MG PO TABS
20.0000 mg | ORAL_TABLET | Freq: Two times a day (BID) | ORAL | Status: DC
  Administered 2012-07-19 – 2012-07-22 (×7): 20 mg via ORAL
  Filled 2012-07-19 (×8): qty 1

## 2012-07-19 MED ORDER — SODIUM CHLORIDE 0.9 % IJ SOLN
10.0000 mL | Freq: Two times a day (BID) | INTRAMUSCULAR | Status: DC
  Administered 2012-07-20: 20 mL

## 2012-07-19 MED ORDER — SIMETHICONE 80 MG PO CHEW
80.0000 mg | CHEWABLE_TABLET | Freq: Four times a day (QID) | ORAL | Status: DC | PRN
  Administered 2012-07-19 – 2012-07-20 (×3): 80 mg via ORAL
  Filled 2012-07-19 (×4): qty 1

## 2012-07-19 MED ORDER — SODIUM CHLORIDE 0.9 % IJ SOLN
10.0000 mL | INTRAMUSCULAR | Status: DC | PRN
  Administered 2012-07-19: 10 mL
  Administered 2012-07-19: 20 mL
  Administered 2012-07-19: 10 mL
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
## 2012-07-19 NOTE — Progress Notes (Addendum)
                    [address]            [place] [address]          [phone]     3 Days Post-Op Procedure(s) (LRB): THORACIC DISCECTOMY (Right) THORACOTOMY OPEN FOR SPINE SURGERY (N/A)  Subjective: C/o hiccups and emesis this am.  Denies pain.  Walking in halls without problem.   Objective: Vital signs in last 24 hours: Patient Vitals for the past 24 hrs:  BP Temp Temp src Pulse Resp SpO2  07/19/12 0543 120/69 mmHg 97.8 F (36.6 C) Oral 97  20  100 %  07/19/12 0155 126/69 mmHg 98.5 F (36.9 C) Oral 99  20  100 %  07/18/12 2107 123/67 mmHg 98.6 F (37 C) Oral 100  18  100 %  07/18/12 1644 133/77 mmHg 98.4 F (36.9 C) Oral 105  18  99 %  07/18/12 1400 126/70 mmHg - - 90  17  99 %  07/18/12 1235 - 98.9 F (37.2 C) Oral - - -  07/18/12 1200 118/72 mmHg - - 95  18  100 %  07/18/12 1100 131/82 mmHg - - 110  23  98 %  07/18/12 1000 124/74 mmHg - - 118  24  99 %   Current Weight  07/17/12 281 lb 8.4 oz (127.7 kg)     Intake/Output from previous day: 12/06 0701 - 12/07 0700 In: 240 [P.O.:240] Out: 1406 [Urine:1225; Stool:1; Chest Tube:180]    PHYSICAL EXAM:  Heart: RRR Lungs: Clear Wound: Clean and dry  Chest tube: no air leak   Lab Results: CBC: Basename 07/16/12 1140 07/16/12 1043  WBC -- --  HGB 11.2* 11.6*  HCT 33.0* 34.0*  PLT -- --   BMET:  Basename 07/16/12 1140 07/16/12 1043  NA 138 139  K 3.7 3.8  CL -- --  CO2 -- --  GLUCOSE -- --  BUN -- --  CREATININE -- --  CALCIUM -- --    PT/INR: No results found for this basename: LABPROT,INR in the last 72 hours  CXR:  Findings: The Q right-sided chest tubes are stable. No definite  right-sided pneumothorax. The right IJ catheter is unchanged.  There is persistent loss of volume in the right lung and areas of  atelectasis. The left lung remains clear.  IMPRESSION:  1. Stable chest tube and right IJ catheter.  2. Persistent loss of volume in the right lung with areas of    atelectasis. No pneumothorax.    Assessment/Plan: S/P Procedure(s) (LRB): THORACIC DISCECTOMY (Right) THORACOTOMY OPEN FOR SPINE SURGERY (N/A) CT output diminishing- had 110, 70 ml out over past 2 shifts respectively.  Hopefully can d/c CTs soon. Hiccups likely due to diaphragmatic irritation from thoracotomy. Will watch.   Mobilize per NS.   LOS: 3 days    COLLINS,GINA H 07/19/2012   will d/c anterior ct today and posterior tube tomorrow I have seen and examined Travis Fitzpatrick and agree with the above assessment  and plan.  Delight Ovens MD Beeper [phone] Office [phone] 07/19/2012 11:50 AM

## 2012-07-19 NOTE — Progress Notes (Signed)
Subjective: Patient reports Patient is mobilizing well. Complains of significant hiccoughs  Objective: Vital signs in last 24 hours: Temp:  [97.8 F (36.6 C)-98.9 F (37.2 C)] 98.6 F (37 C) (12/07 1039) Pulse Rate:  [90-105] 105  (12/07 1039) Resp:  [17-20] 18  (12/07 1039) BP: (118-133)/(67-77) 120/73 mmHg (12/07 1039) SpO2:  [99 %-100 %] 100 % (12/07 1039)  Intake/Output from previous day: 12/06 0701 - 12/07 0700 In: 240 [P.O.:240] Out: 1406 [Urine:1225; Stool:1; Chest Tube:180] Intake/Output this shift: Total I/O In: 20 [I.V.:20] Out: -   Incision clean and dry. Motor function improving in lower extremities. Patient ambulatory with use of walker.  Lab Results:  Basename 07/16/12 1140  WBC --  HGB 11.2*  HCT 33.0*  PLT --   BMET  Basename 07/16/12 1140  NA 138  K 3.7  CL --  CO2 --  GLUCOSE --  BUN --  CREATININE --  CALCIUM --    Studies/Results: Dg Chest Port 1 View  07/19/2012  *RADIOLOGY REPORT*  Clinical Data: Right thoracotomy for spinal surgery.  PORTABLE CHEST - 1 VIEW  Comparison: 07/18/2012.  Findings: The Q right-sided chest tubes are stable.  No definite right-sided pneumothorax.  The right IJ catheter is unchanged. There is persistent loss of volume in the right lung and areas of atelectasis.  The left lung remains clear.  IMPRESSION:  1.  Stable chest tube and right IJ catheter. 2.  Persistent loss of volume in the right lung with areas of atelectasis.  No pneumothorax.   Original Report Authenticated By: Rudie Meyer, M.D.    Dg Chest Port 1 View  07/18/2012  *RADIOLOGY REPORT*  Clinical Data: Status post thoracotomy.  PORTABLE CHEST - 1 VIEW  Comparison: 07/16/2012  Findings: Dual right chest tubes are stable as is the right internal jugular central venous line.  There is some volume loss in the right likely due to a right perihilar and medial base atelectasis.  The lungs otherwise clear. No residual vascular congestion.  No pleural effusion or  pneumothorax.  IMPRESSION: Lines and tubes are stable well-positioned.  No residual vascular congestion.  Persistent right perihilar and medial lung base atelectasis.   Original Report Authenticated By: Amie Portland, M.D.     Assessment/Plan: Stable postop chest tube management per thoracic surgery  LOS: 3 days  10 simethicone and Pepcid for hiccups   Travis Fitzpatrick J 07/19/2012, 11:28 AM

## 2012-07-19 NOTE — Progress Notes (Signed)
Physical Therapy Treatment Patient Details Name: Travis Fitzpatrick MRN: [id number] DOB: Jun 01, 1981 Today's Date: 07/19/2012 Time: 1050-1110 PT Time Calculation (min): 20 min  PT Assessment / Plan / Recommendation Comments on Treatment Session  Pt moving better this session with increased LE strength. Encouraged pt to ambulate as WB is the best exercise for his LEs. Pt still with chest tube and deciding on d/c plan. Pt safe to d/c home following practice of stairs.    Follow Up Recommendations  Home health PT;Supervision - Intermittent     Does the patient have the potential to tolerate intense rehabilitation     Barriers to Discharge        Equipment Recommendations  None recommended by OT;None recommended by PT    Recommendations for Other Services    Frequency Min 5X/week   Plan Discharge plan remains appropriate;Frequency remains appropriate    Precautions / Restrictions Precautions Precautions: Fall;Back Precaution Comments: pt able to recall 2/3 back precautions, pt reeducated Restrictions Weight Bearing Restrictions: No   Pertinent Vitals/Pain Pain in R chest 6/10. RN aware.     Mobility  Bed Mobility Bed Mobility: Not assessed Transfers Transfers: Sit to Stand;Stand to Sit Sit to Stand: 4: Min guard;With upper extremity assist;From chair/3-in-1 Stand to Sit: 4: Min guard;With upper extremity assist;To chair/3-in-1 Details for Transfer Assistance: Minguard for safety during transfers. Cues for safe hand placement and avoidance of bending over. Slow movement although controlled Ambulation/Gait Ambulation/Gait Assistance: 4: Min guard Ambulation Distance (Feet): 200 Feet Assistive device: Rolling walker Ambulation/Gait Assistance Details: Minguard for stability and safety this session with cueing for safe technique with RW. Increased L sided weakness with fatigue towards end of ambulation although pt was able to maintain balance. Pt also with noted R hip ER, able to  self correct but increased with fatigue Gait Pattern: Decreased stride length;Step-to pattern Gait velocity: slow gait speed Stairs: No    Exercises General Exercises - Lower Extremity Ankle Circles/Pumps: AROM;Strengthening;Both;10 reps;Seated (against resistance) Long Arc Quad: AROM;Strengthening;Left;10 reps;Other (comment);Seated (against resistance) Hip Flexion/Marching: AROM;Strengthening;Both;5 reps;Seated   PT Diagnosis:    PT Problem List:   PT Treatment Interventions:     PT Goals Acute Rehab PT Goals PT Goal: Sit to Stand - Progress: Progressing toward goal PT Goal: Stand to Sit - Progress: Progressing toward goal PT Goal: Ambulate - Progress: Progressing toward goal  Visit Information  Last PT Received On: 07/19/12 Assistance Needed: +1    Subjective Data      Cognition  Overall Cognitive Status: Appears within functional limits for tasks assessed/performed Arousal/Alertness: Awake/alert Orientation Level: Appears intact for tasks assessed Behavior During Session: [facility] for tasks performed    Balance  Static Standing Balance Single Leg Stance - Right Leg: 1  (with UE assist during functional activity) Single Leg Stance - Left Leg: 1  (with UE assist during functional activity)  End of Session PT - End of Session Equipment Utilized During Treatment: Gait belt Activity Tolerance: Patient tolerated treatment well Patient left: in chair;with call bell/phone within reach Nurse Communication: Mobility status   GP     Travis Fitzpatrick 07/19/2012, 11:28 AM

## 2012-07-20 ENCOUNTER — Inpatient Hospital Stay (HOSPITAL_COMMUNITY): Payer: Medicare Other

## 2012-07-20 NOTE — Progress Notes (Signed)
Overall doing well. Patient states that his pain is recently well-controlled. Lower trimming function significantly improved from preop.  Chest tube removed. Patient afebrile. Vital stable. Urine output good. Dressing clean and dry. Neurological exam stable.  Progressing well following thoracic discectomy. Continue efforts at mobilization. Probable discharge home tomorrow.

## 2012-07-20 NOTE — Progress Notes (Signed)
                    [address]            [place] [address]          [phone]     4 Days Post-Op Procedure(s) (LRB): THORACIC DISCECTOMY (Right) THORACOTOMY OPEN FOR SPINE SURGERY (N/A)  Subjective: Stable, no new complaints. Hiccups somewhat improved after CT removed yesterday.   Objective: Vital signs in last 24 hours: Patient Vitals for the past 24 hrs:  BP Temp Temp src Pulse Resp SpO2  07/20/12 1005 130/67 mmHg 97.9 F (36.6 C) Oral 97  18  99 %  07/20/12 0600 133/71 mmHg 98.6 F (37 C) Oral 106  18  98 %  07/20/12 0200 129/54 mmHg 98.1 F (36.7 C) Oral 74  18  100 %  07/19/12 2200 117/56 mmHg 98.9 F (37.2 C) Oral 107  18  100 %  07/19/12 1917 128/66 mmHg 99 F (37.2 C) Oral 103  18  100 %  07/19/12 1529 130/75 mmHg 98.7 F (37.1 C) Oral 96  18  100 %   Current Weight  07/17/12 281 lb 8.4 oz (127.7 kg)     Intake/Output from previous day: 12/07 0701 - 12/08 0700 In: 20 [I.V.:20] Out: 1580 [Urine:1550; Chest Tube:30]    PHYSICAL EXAM:  Heart: RRR Lungs: Clear Wound: Clean and dry     Lab Results: CBC:No results found for this basename: WBC:2,HGB:2,HCT:2,PLT:2 in the last 72 hours BMET: No results found for this basename: NA:2,K:2,CL:2,CO2:2,GLUCOSE:2,BUN:2,CREATININE:2,CALCIUM:2 in the last 72 hours  PT/INR: No results found for this basename: LABPROT,INR in the last 72 hours  CXR: Findings: One of the right chest tubes have been removed. There is  still a right chest tube in place. The patient has developed a  small pneumothorax along the lateral aspect of the chest. This  roughly measures 5-10%. There is volume loss at the right lung  base and right hilum. Post surgical changes in the thoracic spine.  Left lung is clear. Heart size is stable. Jugular central venous  catheter in the lower SVC region.  IMPRESSION:  Removal of one right chest tube. The patient now has a small right  pneumothorax. Findings called to the  patient's nurse, Norwood Levo, on  07/20/2012 at 08:55 a.m.    Assessment/Plan: S/P Procedure(s) (LRB): THORACIC DISCECTOMY (Right) THORACOTOMY OPEN FOR SPINE SURGERY (N/A) CXR ?tiny lateral ptx, CT output scant.  Will d/c remaining CT. Check PA/Lat CXR in am.   Possibly ready to d/c in am from our standpoint - per NS.   LOS: 4 days    Emory Gallentine H 07/20/2012

## 2012-07-20 NOTE — Progress Notes (Signed)
Removed CT per MD order per hospital policy. Patient tolerated well. Applied Vaseline guaze and gauze to suture site. Patient advised to remain in bed until chest xray done. Xray called and pain med given. Will continue to monitor closely. Lajuana Matte, RN

## 2012-07-21 ENCOUNTER — Inpatient Hospital Stay (HOSPITAL_COMMUNITY): Payer: Medicare Other

## 2012-07-21 NOTE — Progress Notes (Signed)
5 Days Post-Op Procedure(s) (LRB): THORACIC DISCECTOMY (Right) THORACOTOMY OPEN FOR SPINE SURGERY (N/A) Subjective: No specific complaints. Hiccups much better since chest tubes out.  Objective: Vital signs in last 24 hours: Temp:  [98.1 F (36.7 C)-99.1 F (37.3 C)] 98.4 F (36.9 C) (12/09 1021) Pulse Rate:  [75-115] 75  (12/09 1021) Cardiac Rhythm:  [-]  Resp:  [17-18] 18  (12/09 1021) BP: (108-135)/(53-72) 118/58 mmHg (12/09 1021) SpO2:  [100 %] 100 % (12/09 1021)  Hemodynamic parameters for last 24 hours:    Intake/Output from previous day: 12/08 0701 - 12/09 0700 In: 20 [I.V.:20] Out: 1775 [Urine:1475; Chest Tube:300] Intake/Output this shift:    Heart: regular rate and rhythm, S1, S2 normal, no murmur, click, rub or gallop Lungs: diminished breath sounds base - right Wound: incision ok  Lab Results: No results found for this basename: WBC:2,HGB:2,HCT:2,PLT:2 in the last 72 hours BMET: No results found for this basename: NA:2,Fitzpatrick:2,CL:2,CO2:2,GLUCOSE:2,BUN:2,CREATININE:2,CALCIUM:2 in the last 72 hours  PT/INR: No results found for this basename: LABPROT,INR in the last 72 hours ABG    Component Value Date/Time   PHART 7.450 07/16/2012 1140   HCO3 24.5* 07/16/2012 1140   TCO2 26 07/16/2012 1140   ACIDBASEDEF 1.0 10/29/2008 1136   O2SAT 97.0 07/16/2012 1140   CBG (last 3)  No results found for this basename: GLUCAP:3 in the last 72 hours   CXR:  Right basilar atelectasis.  Assessment/Plan: S/P Procedure(s) (LRB): THORACIC DISCECTOMY (Right) THORACOTOMY OPEN FOR SPINE SURGERY (N/A)  He is doing well from my point of view and can go home when ok with Dr. Franky Macho. I gave him my office number and he will call for a follow up appt. For 1 week so I can remove his chest tube sutures.   LOS: 5 days    Travis Fitzpatrick 07/21/2012

## 2012-07-21 NOTE — Progress Notes (Signed)
Occupational Therapy Treatment and Discharge Patient Details Name: Travis Fitzpatrick MRN: [id number] DOB: Oct 15, 1980 Today's Date: 07/21/2012 Time: 4098-1191 OT Time Calculation (min): 23 min  OT Assessment / Plan / Recommendation Comments on Treatment Session This 31 yo s/p back surgery has completed all education and questions answered in relation to self care, D/C from acute OT.    Follow Up Recommendations  No OT follow up       Equipment Recommendations  None recommended by OT       Frequency Min 2X/week   Plan Discharge plan needs to be updated    Precautions / Restrictions Precautions Precautions: Back Precaution Comments: Pt. able to recall 3/3 back precautions with cues to state arching Restrictions Weight Bearing Restrictions: No   Pertinent Vitals/Pain 5/10 back    ADL  Tub/Shower Transfer: Performed;Supervision/safety Tub/Shower Transfer Method: Science writer: Shower seat with back;Transfer tub bench (tried both, pt has a tub bench) Equipment Used: Rolling walker Transfers/Ambulation Related to ADLs: Supervision for all ADL Comments: Pt asked about toilleting hygiene--informed to use wet wipes and that he could either do front to back or lean laterally and reach around back.      OT Goals ADL Goals ADL Goal: Grooming - Progress:  (based on way pt moving, should have no problems) ADL Goal: Toileting - Hygiene - Progress:  (verbalized understanding) ADL Goal: Tub/Shower Transfer - Progress: Met  Visit Information  Last OT Received On: 07/21/12 Assistance Needed: +1          Cognition  Overall Cognitive Status: Appears within functional limits for tasks assessed/performed Arousal/Alertness: Awake/alert Orientation Level: Appears intact for tasks assessed Behavior During Session: [facility] for tasks performed    Mobility   Bed Mobility Bed Mobility: Not assessed Details for Bed Mobility Assistance: pt. reporting that has been able  to get in/OOB Transfers Transfers: Sit to Stand;Stand to Sit Sit to Stand: 5: Supervision;With upper extremity assist;With armrests;From chair/3-in-1 Stand to Sit: 5: Supervision;With upper extremity assist;With armrests;To chair/3-in-1 Details for Transfer Assistance: Pt. min guard with initially rising for safety and steadiness. Pt. demonstrated proper hand placement when stepping to/sitting from RW. Pt. reporting that it feels as though it is getting easier to stand up and sit down.             End of Session OT - End of Session Activity Tolerance: Patient tolerated treatment well Patient left: in chair;with call bell/phone within reach       Evette Georges [phone] 07/21/2012, 3:18 PM

## 2012-07-21 NOTE — Progress Notes (Signed)
Physical Therapy Treatment Patient Details Name: Travis Fitzpatrick MRN: [id number] DOB: 01/27/1981 Today's Date: 07/21/2012 Time: 4098-1191 PT Time Calculation (min): 18 min  PT Assessment / Plan / Recommendation Comments on Treatment Session  Pt. presents to be moving well with ambulation and able to ascend/descend stairs with 2 methods (using +1HHA and 1 rail to mimic 2 peopele helping at home and backwards with RW and no rails). Pt presents steady with both methods. Pt encouraged to be sitting up in chair for all meals and to ambulate into hall with nursing 2-102more times during the day.    Follow Up Recommendations  Home health PT;Supervision - Intermittent     Does the patient have the potential to tolerate intense rehabilitation     Barriers to Discharge        Equipment Recommendations       Recommendations for Other Services    Frequency Min 5X/week   Plan Discharge plan remains appropriate;Frequency remains appropriate    Precautions / Restrictions Precautions Precautions: Fall;Back Precaution Comments: Pt. able to recall 3/3 back precautions with cues to state arching Restrictions Weight Bearing Restrictions: No   Pertinent Vitals/Pain Patient reports pain 5/10 around chest tube site; pt reporting that he has had pain medication    Mobility  Bed Mobility Bed Mobility: Not assessed Details for Bed Mobility Assistance: pt. reporting that has been able to get in/OOB Transfers Transfers: Sit to Stand;Stand to Sit Sit to Stand: 4: Min guard;With upper extremity assist;With armrests;From chair/3-in-1 Stand to Sit: 5: Supervision;With upper extremity assist;With armrests;To chair/3-in-1 Details for Transfer Assistance: Pt. min guard with initially rising for safety and steadiness. Pt. demonstrated proper hand placement when stepping to/sitting from RW. Pt. reporting that it feels as though it is getting easier to stand up and sit down. Ambulation/Gait Ambulation/Gait  Assistance: 4: Min guard Ambulation Distance (Feet): 220 Feet Assistive device: Rolling walker;None Ambulation/Gait Assistance Details: Pt. ambulated into hallway with RW and able to ambulate to stair well. Pt. given VC to stay close to the RW. Pt. attempted ambulation to the bathroom with no AD and had no LOB and only slightly increased unsteadiness with directional changes. Pt verbally agrees to continue to use RW as he stated he saw that he was still unsteady without it.  Gait Pattern: Step-through pattern Stairs: Yes Stairs Assistance: 4: Min guard Stairs Assistance Details (indicate cue type and reason): Pt. attempted to ascend stairs backwards with RW and no rails to mimic set up at home. Pt. able to execute with min guard for safety and steadiness. Pt. stated he would have enough help at home and would be able to have someone on each side of him to step up with no RW. Pt demonstrated by using +1HHA and rail on right and was steady with this method. Stair Management Technique: No rails;Backwards;With walker;One rail Right;Forwards Number of Stairs: 1  (x2trials) Wheelchair Mobility Wheelchair Mobility: No      PT Goals Acute Rehab PT Goals PT Goal Formulation: With patient/family Time For Goal Achievement: 07/24/12 Potential to Achieve Goals: Good Pt will Roll Supine to Left Side: with modified independence Pt will go Supine/Side to Sit: with modified independence Pt will go Sit to Stand: with modified independence PT Goal: Sit to Stand - Progress: Progressing toward goal Pt will go Stand to Sit: with modified independence PT Goal: Stand to Sit - Progress: Progressing toward goal Pt will Ambulate: >150 feet;with modified independence;with rolling walker PT Goal: Ambulate - Progress: Progressing toward goal  Pt will Go Up / Down Stairs: 1-2 stairs;with modified independence;with least restrictive assistive device PT Goal: Up/Down Stairs - Progress: Progressing toward goal  Visit  Information  Last PT Received On: 07/21/12 Assistance Needed: +1    Subjective Data  Subjective: "Oh I am good" Patient Stated Goal: To return home with family   Cognition  Overall Cognitive Status: Appears within functional limits for tasks assessed/performed Arousal/Alertness: Awake/alert Orientation Level: Appears intact for tasks assessed Behavior During Session: [facility] for tasks performed    Balance     End of Session PT - End of Session Equipment Utilized During Treatment: Gait belt Activity Tolerance: Patient tolerated treatment well Patient left: in chair;with call bell/phone within reach Nurse Communication: Mobility status    Mertie Clause, SPTA 07/21/2012, 1:54 PM

## 2012-07-21 NOTE — Progress Notes (Addendum)
Patient complained of sharp pain in right side.  Upon asculatation did not note any change from previous assessment, and no drainage noted on dressings to old chest tube site.  Patient turned on left side and noted pain to right flank diminished.  Advised patient to report any further pain or issues breathing. Will continue to monitor patient. Chest xray ordered already for the morning. Travis Fitzpatrick

## 2012-07-22 MED ORDER — OXYCODONE-ACETAMINOPHEN 5-325 MG PO TABS: 1.0000 | ORAL_TABLET | Freq: Four times a day (QID) | ORAL | Status: DC | PRN

## 2012-07-22 MED ORDER — CYCLOBENZAPRINE HCL 10 MG PO TABS: 10.0000 mg | ORAL_TABLET | Freq: Three times a day (TID) | ORAL | Status: DC | PRN

## 2012-07-22 NOTE — Discharge Summary (Signed)
Physician Discharge Summary  Patient ID: Travis Fitzpatrick MRN: [id number] DOB/AGE: 1980-12-10 31 y.o.  Admit date: 07/16/2012 Discharge date: 07/22/2012  Admission Diagnoses:thoracic disc with myelopathy  Discharge Diagnoses:  Active Problems:  Thoracic disc disease with myelopathy   Discharged Condition: good  Hospital Course: Travis Fitzpatrick was admitted to the hospital for treatment of a thoracic disc herniation with myelopathy. He had previously undergone a thoracic discetomy and saw improvement until the last 10-12 months. MRI revealed a new compressive lesion one level above the previous operative site. He was taken to the OR and underwent a right thoracotomy and discetomy. Post op he has done very well, has been cleared by physical therapy, and is voiding. He feels less numbness and believes he is walking better.   Consults: None  Significant Diagnostic Studies: none  Treatments: surgery: right redo thoracotomy for T7/8 discetomy  Discharge Exam: Blood pressure 128/68, pulse 93, temperature 98.1 F (36.7 C), temperature source Oral, resp. rate 18, height 5\' 9"  (1.753 m), weight 127.7 kg (281 lb 8.4 oz), SpO2 99.00%. General appearance: alert, cooperative, appears stated age, no distress and moderately obese Neurologic: Mental status: Alert, oriented, thought content appropriate Motor: weak in lower extremities Coordination: spastic in lower extremities  Disposition: 01-Home or Self Care  Discharge Orders    Future Orders Please Complete By Expires   Home Health      Questions: Responses:   To provide the following care/treatments PT    OT   Face-to-face encounter      Comments:   I POOL,HENRY A certify that this patient is under my care and that I, or a nurse practitioner or physician's assistant working with me, had a face-to-face encounter that meets the physician face-to-face encounter requirements with this patient on 07/22/2012. The encounter with the patient was in  whole, or in part for the following medical condition(s) which is the primary reason for home health care (List medical condition): thoracic myelopathy   Questions: Responses:   The encounter with the patient was in whole, or in part, for the following medical condition, which is the primary reason for home health care thoracic myelopathy   I certify that, based on my findings, the following services are medically necessary home health services Physical therapy   My clinical findings support the need for the above services Pain interferes with ambulation/mobility   Further, I certify that my clinical findings support that this patient is homebound due to: Ambulates short distances less than 300 feet   To provide the following care/treatments PT    OT       Medication List     As of 07/22/2012  2:20 PM    TAKE these medications         cyclobenzaprine 10 MG tablet   Commonly known as: FLEXERIL   Take 1 tablet (10 mg total) by mouth 3 (three) times daily as needed for muscle spasms.      diclofenac 50 MG EC tablet   Commonly known as: VOLTAREN   Take 50 mg by mouth 2 (two) times daily.      ibuprofen 800 MG tablet   Commonly known as: ADVIL,MOTRIN   Take 800 mg by mouth every 8 (eight) hours as needed. For pain      oxyCODONE-acetaminophen 5-325 MG per tablet   Commonly known as: PERCOCET/ROXICET   Take 1 tablet by mouth every 6 (six) hours as needed for pain.  Follow-up Information    Follow up with Desi Rowe L, MD. In 3 weeks. (call to make appt)    Contact information:   [address]                         [address] [place] [address] [phone]          Signed: Ndrew Creason L 07/22/2012, 2:20 PM

## 2012-07-22 NOTE — Progress Notes (Signed)
Travis Fitzpatrick, [place] [phone] 07/22/2012

## 2012-07-22 NOTE — Care Management Note (Signed)
    Page 1 of 1   07/22/2012     1:44:27 PM   CARE MANAGEMENT NOTE 07/22/2012  Patient:  CADENCE, HASLAM   Account Number:  [id number]  Date Initiated:  07/22/2012  Documentation initiated by:  [facility]  Subjective/Objective Assessment:   Admitted postop thoracic discectomy     Action/Plan:   PT/OT evals-recommended HHPT   Anticipated DC Date:  07/22/2012   Anticipated DC Plan:  HOME W HOME HEALTH SERVICES      DC Planning Services  CM consult      Choice offered to / List presented to:  C-1 Patient        HH arranged  HH-2 PT  HH-3 OT      [facility] agency  [facility]   Status of service:  Completed, signed off Medicare Important Message given?   (If response is "NO", the following Medicare IM given date fields will be blank) Date Medicare IM given:   Date Additional Medicare IM given:    Discharge Disposition:  HOME W HOME HEALTH SERVICES  Per UR Regulation:  Reviewed for med. necessity/level of care/duration of stay  If discussed at Long Length of Stay Meetings, dates discussed:   07/22/2012    Comments:  07/22/12 Spoke with patient about HHC for HHPT and HHOT. He chose [facility] from the [facility] list of [facility] agencies. Contacted Chaela Branscum at [facility] and requested HHPT and HHOT. No equipment needs identified. Jacquelynn Cree RN, BSN, CCM

## 2012-07-22 NOTE — Progress Notes (Signed)
Wong Steadham, PTA [phone] 07/22/2012  

## 2012-07-22 NOTE — Progress Notes (Signed)
Patient discharged , very stable, assessments remained unchanged now.Awaiting on transportation.

## 2012-07-22 NOTE — Progress Notes (Signed)
Physical Therapy Treatment Patient Details Name: Travis Fitzpatrick MRN: [id number] DOB: 1981-02-22 Today's Date: 07/22/2012 Time: 4098-1191 PT Time Calculation (min): 24 min  PT Assessment / Plan / Recommendation Comments on Treatment Session  Pt. continues to move well with RW and reporting that he has been able to get up in his room to ambulate throughout the day. Pt. attempted ambulation to bathroom with no AD and min guard but increased unsteadiness. Pt. able to verbalize and demonstrate sequence and technique for stair set up at home with some cuing from SPTA.     Follow Up Recommendations  Home health PT;Supervision - Intermittent     Does the patient have the potential to tolerate intense rehabilitation     Barriers to Discharge        Equipment Recommendations       Recommendations for Other Services    Frequency Min 5X/week   Plan Discharge plan remains appropriate;Frequency remains appropriate    Precautions / Restrictions Precautions Precautions: Back Precaution Comments: Pt. able to recall 3/3 back precautions. Restrictions Weight Bearing Restrictions: No   Pertinent Vitals/Pain Patient rates pain around back incision 5/10, RN aware and pt reports having had medication.    Mobility  Bed Mobility Bed Mobility: Not assessed Details for Bed Mobility Assistance: Pt. sitting up in recliner upon arrival to room Transfers Transfers: Sit to Stand;Stand to Sit Sit to Stand: 5: Supervision;With upper extremity assist;With armrests;From chair/3-in-1 Stand to Sit: 5: Supervision;With upper extremity assist;With armrests;To chair/3-in-1 Details for Transfer Assistance: Pt. continuing to be able to stand without (A) and is presenting steady stepping to/sitting from RW demonstrating proper hand placement. Ambulation/Gait Ambulation/Gait Assistance: 5: Supervision;4: Min guard Ambulation Distance (Feet): 450 Feet Assistive device: Rolling walker;None Ambulation/Gait  Assistance Details: Pt. ambulated into hallway with increased moments of supervision. Pt min guard with increased gt distance and maneuvering in tight spaces. Pt stating that he is feeling increasingly steady with ambulation. Pt attempted ambulation to bathroom with no AD and min guard for safety and steadiness. Pt. reports that he knows he is still unsteady without RW at this time. Gait Pattern: Step-through pattern Stairs: Yes Stairs Assistance: 4: Min guard Stairs Assistance Details (indicate cue type and reason): Pt able to verbalize sequence and technique to SPTA for being able to maneuver the one step mimicing the setup that is in his home. Pt. used one rail on Rt and +1HHA to mimc 2 people helping at home. Stair Management Technique: One rail Right;Forwards Number of Stairs: 1  Wheelchair Mobility Wheelchair Mobility: No      PT Goals Acute Rehab PT Goals PT Goal Formulation: With patient/family Time For Goal Achievement: 07/24/12 Potential to Achieve Goals: Good Pt will Roll Supine to Left Side: with modified independence Pt will go Supine/Side to Sit: with modified independence Pt will go Sit to Stand: with modified independence PT Goal: Sit to Stand - Progress: Progressing toward goal Pt will go Stand to Sit: with modified independence PT Goal: Stand to Sit - Progress: Progressing toward goal Pt will Ambulate: >150 feet;with modified independence;with rolling walker PT Goal: Ambulate - Progress: Progressing toward goal Pt will Go Up / Down Stairs: 1-2 stairs;with modified independence;with least restrictive assistive device PT Goal: Up/Down Stairs - Progress: Progressing toward goal  Visit Information  Last PT Received On: 07/22/12 Assistance Needed: +1    Subjective Data  Subjective: "I am just ready to go!" Patient Stated Goal: To return home with family   Cognition  Overall Cognitive  Status: Appears within functional limits for tasks  assessed/performed Arousal/Alertness: Awake/alert Orientation Level: Appears intact for tasks assessed Behavior During Session: [facility] for tasks performed    Balance  Balance Balance Assessed: Yes Static Standing Balance Static Standing - Balance Support: Right upper extremity supported;Left upper extremity supported;Bilateral upper extremity supported;No upper extremity supported Static Standing - Level of Assistance: 5: Stand by assistance Static Standing - Comment/# of Minutes: Pt. able to stand with perturbations to the shoulders with varying levels of UE support with both eyes open and closed. Pt SBA with perturbations and able to stand at [facility] with no UE support with eyes open and closed. Pt reports that he was a little doubtful that he could do it, but had only slight increase sway.   End of Session PT - End of Session Equipment Utilized During Treatment: Gait belt Activity Tolerance: Patient tolerated treatment well Patient left: in chair;with call bell/phone within reach Nurse Communication: Mobility status    Mertie Clause, SPTA 07/22/2012, 12:07 PM

## 2012-07-29 ENCOUNTER — Encounter (INDEPENDENT_AMBULATORY_CARE_PROVIDER_SITE_OTHER): Payer: Self-pay

## 2012-07-29 DIAGNOSIS — M539 Dorsopathy, unspecified: Secondary | ICD-10-CM

## 2012-08-17 IMAGING — CR DG CHEST 2V
2 series · 2 of 2 positions shown · non-contrast
Comparison: 07/24/2011

CLINICAL DATA: Status post thoracotomy for T8-9 thoracic fusion

CHEST - 2 VIEW

[w chest pa]
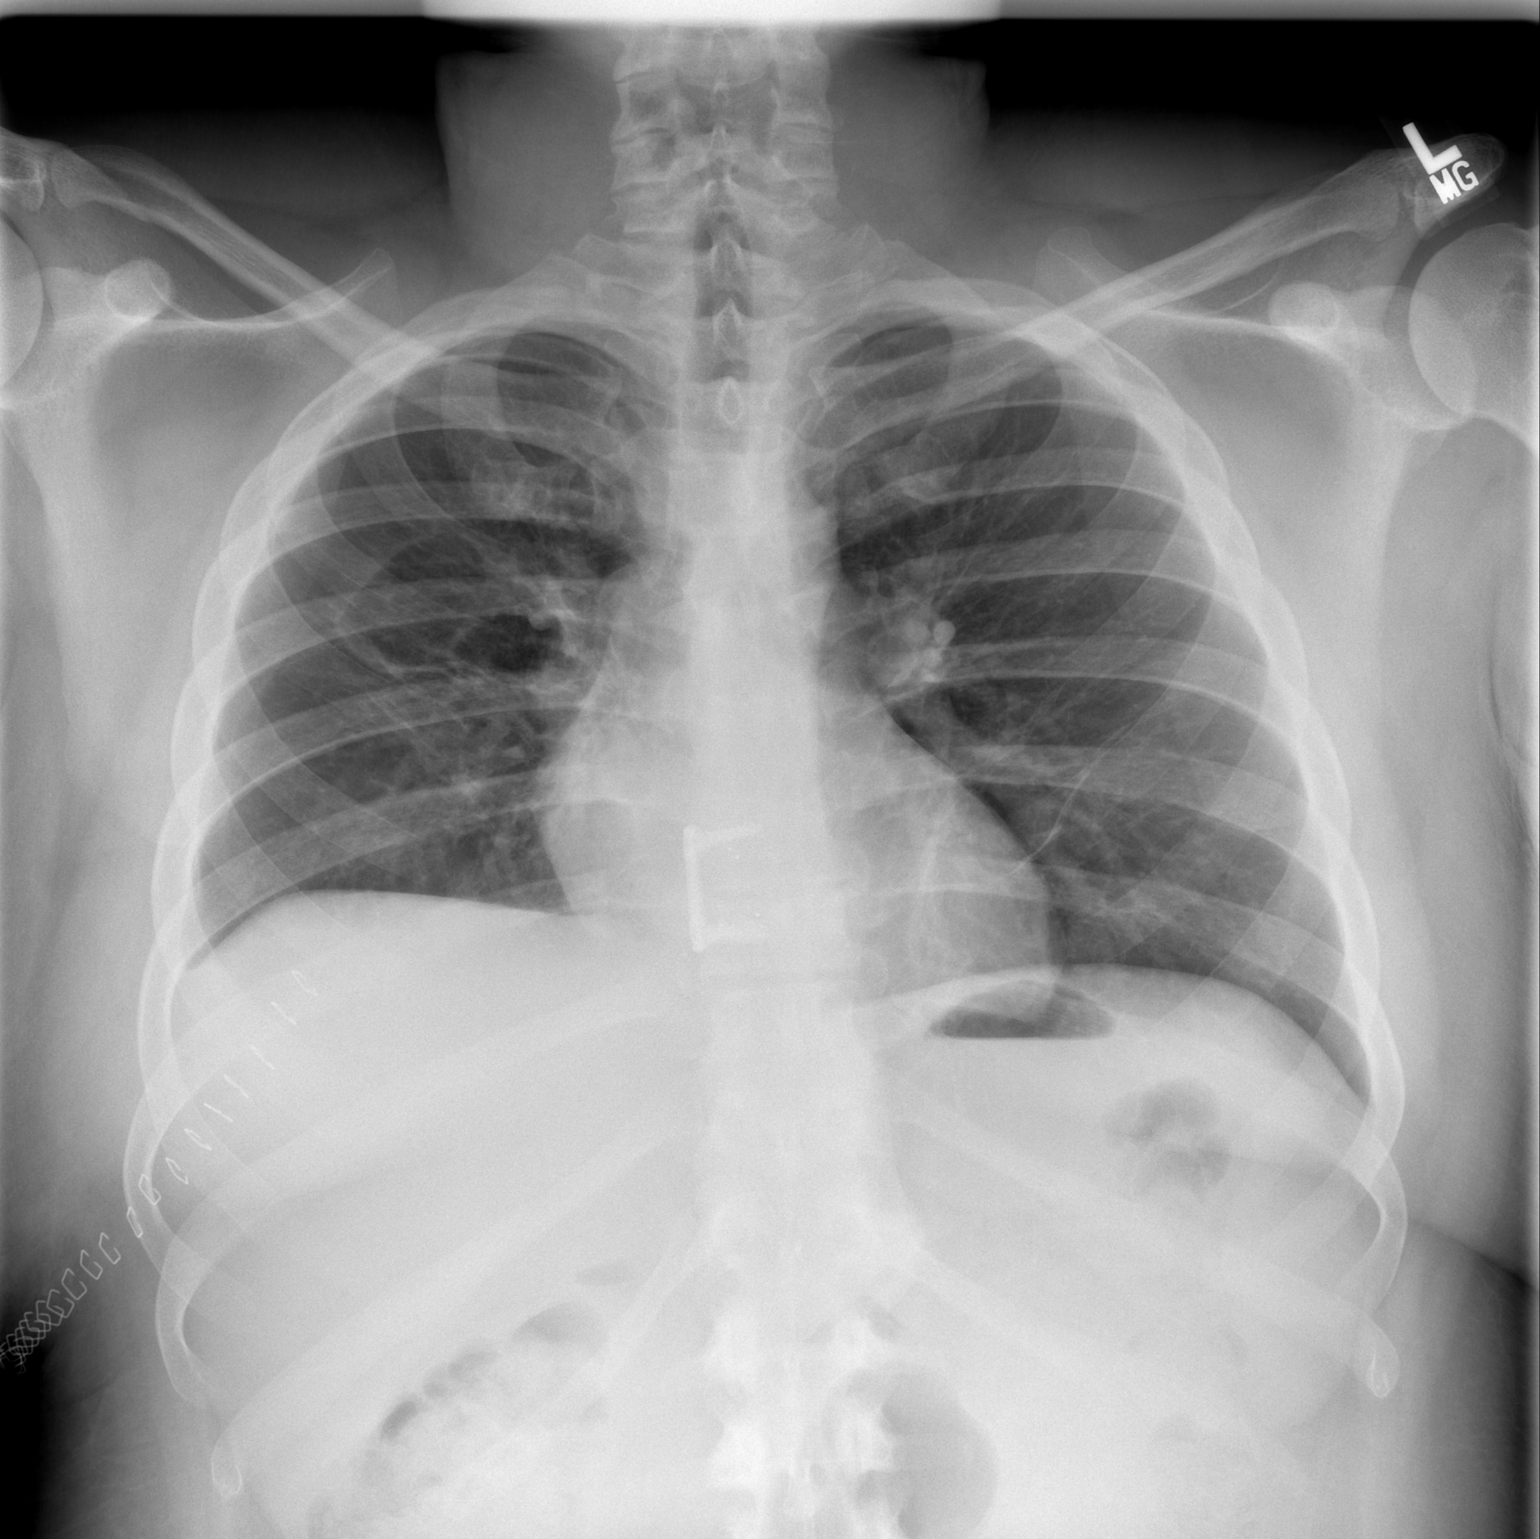

[w chest lat]
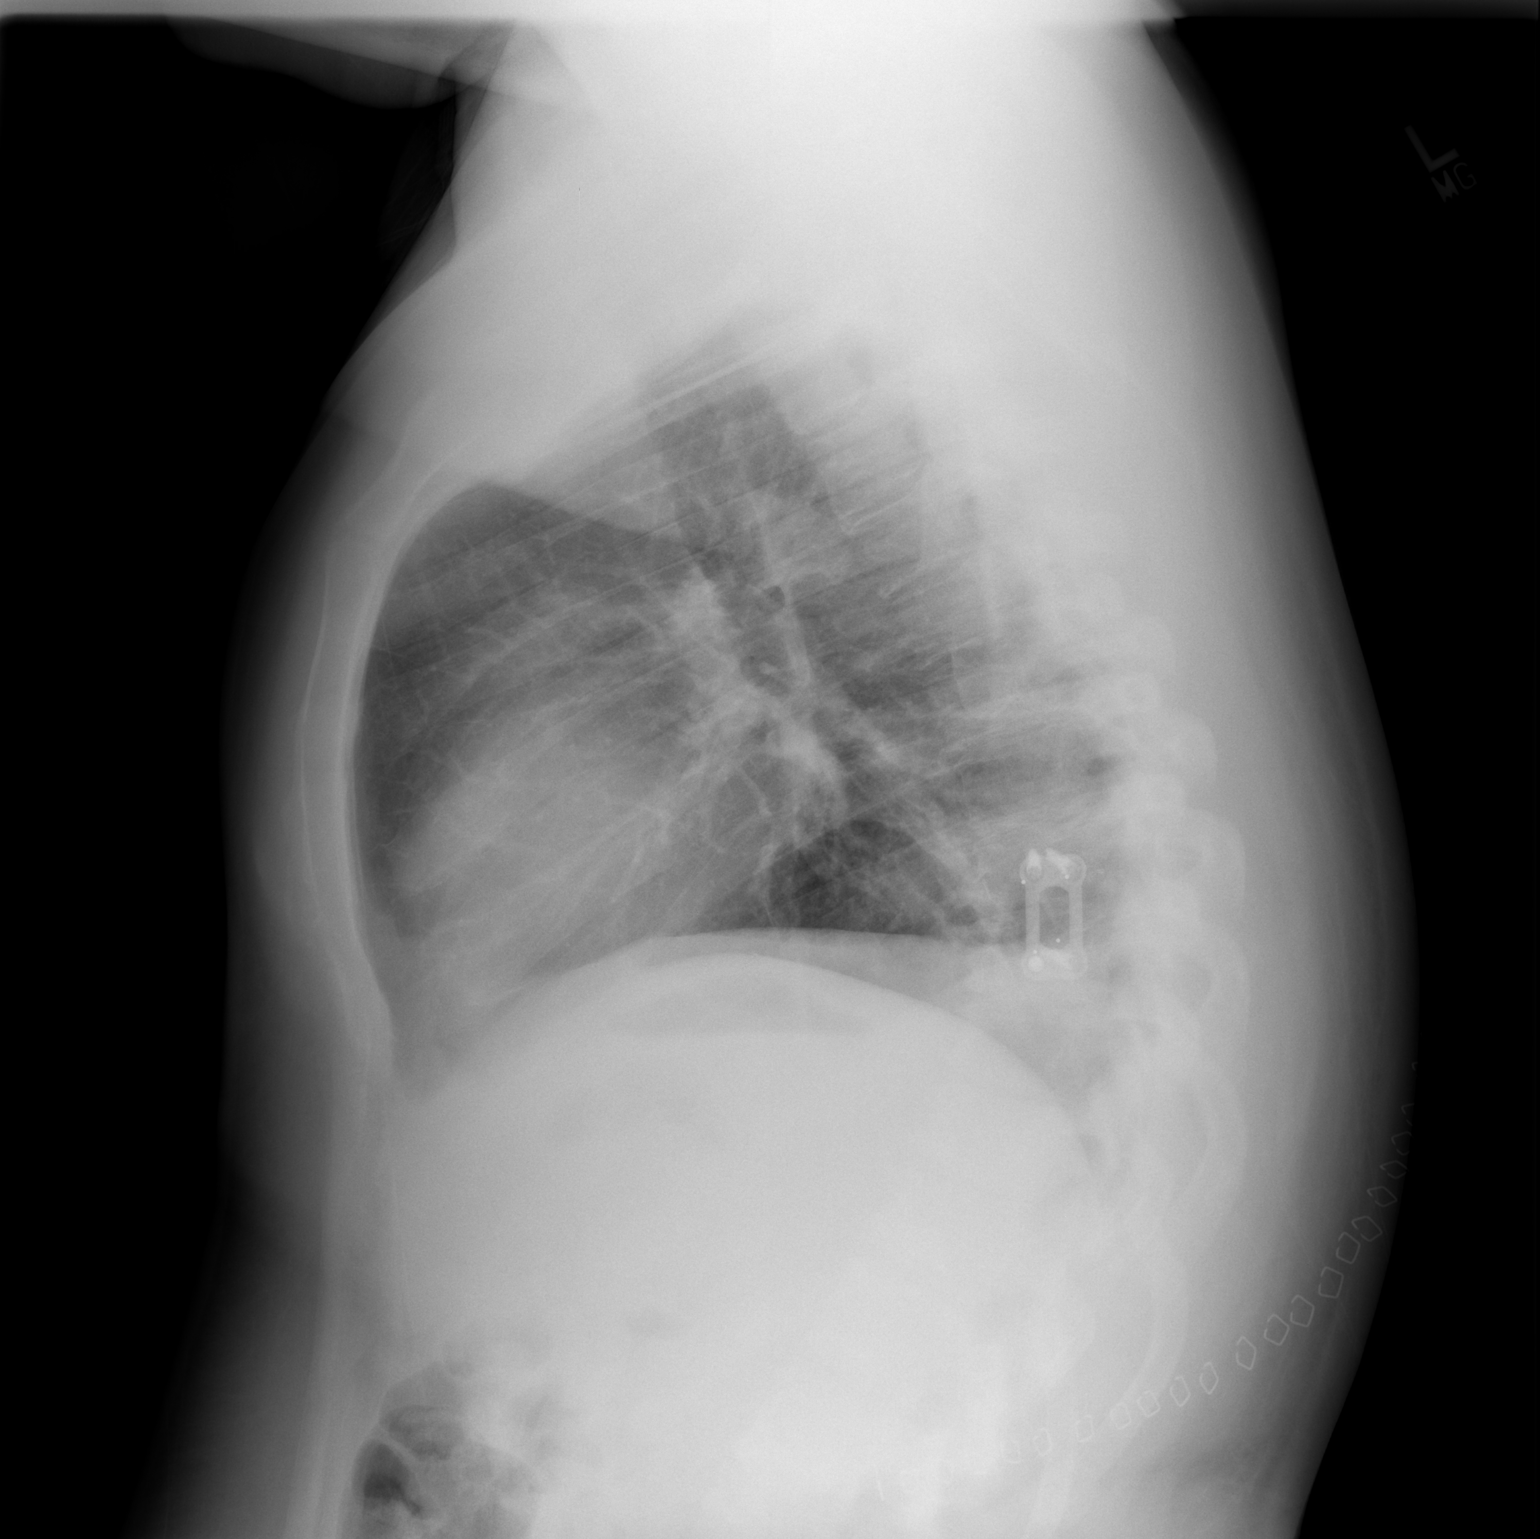

[2 of 2 positions shown; findings below may reference images not displayed]

FINDINGS: Right thoracotomy skin staples noted.  Decreased lung
volumes persist with small amount of residual right pleural
thickening versus pleural effusion.  Scattered perihilar and left
lower lobe atelectasis.  No pneumothorax.  Trachea midline.  Right
midlung circular lucent areas compatible with pneumatocele.
Trachea midline.
IMPRESSION: Stable postoperative finding status post right thoracotomy.
Residual low lung volumes and atelectasis.

Lucent cystic areas in the right hilar region compatible with
pneumatocele

Postoperative findings of thoracic fusion.

No pneumothorax

## 2012-08-18 ENCOUNTER — Other Ambulatory Visit: Payer: Self-pay | Admitting: *Deleted

## 2012-08-18 DIAGNOSIS — Z9889 Other specified postprocedural states: Secondary | ICD-10-CM

## 2012-08-19 ENCOUNTER — Encounter: Payer: Medicare Other | Admitting: Surgery

## 2012-08-20 ENCOUNTER — Encounter: Payer: Medicare Other | Admitting: Surgery

## 2012-08-25 ENCOUNTER — Encounter: Payer: Self-pay | Admitting: Physician Assistant

## 2012-08-25 ENCOUNTER — Ambulatory Visit (INDEPENDENT_AMBULATORY_CARE_PROVIDER_SITE_OTHER): Payer: Self-pay | Admitting: Physician Assistant

## 2012-08-25 ENCOUNTER — Ambulatory Visit
Admission: RE | Admit: 2012-08-25 | Discharge: 2012-08-25 | Disposition: A | Discharge status: Home / Self Care | Payer: Medicare Other | Source: Ambulatory Visit | Attending: Surgery | Admitting: Surgery

## 2012-08-25 VITALS — BP 145/76 | HR 83 | Resp 16 | Ht 69.0 in | Wt 280.0 lb

## 2012-08-25 DIAGNOSIS — M5124 Other intervertebral disc displacement, thoracic region: Secondary | ICD-10-CM

## 2012-08-25 DIAGNOSIS — Z09 Encounter for follow-up examination after completed treatment for conditions other than malignant neoplasm: Secondary | ICD-10-CM

## 2012-08-25 DIAGNOSIS — Z9889 Other specified postprocedural states: Secondary | ICD-10-CM

## 2012-08-25 NOTE — Progress Notes (Signed)
                   [address]            [place] [address]          [phone]    CARDIAC SURGERY POSTOPERATIVE VISIT  Patient Name: Travis Fitzpatrick MRN: [id number] DOB: 1981/07/06  Subjective: Travis Fitzpatrick is a 32 y.o. male here for routine follow up s/p right posterior thoracotomy for thoracic disease with myelopathy. He has occasional soreness at incision site and the hiccups he previously had fairly often, now only occur intermittingly. He denies shortness of breath, fever, or chills.  Past Medical History  Diagnosis Date  . Shortness of breath     w/exertion  . Urinary urgency   . Bowel trouble     urgency  . Chronic kidney disease     patient denies   Prior to Admission medications   Medication Sig Start Date End Date Taking? Authorizing Provider  cyclobenzaprine (FLEXERIL) 10 MG tablet Take 1 tablet (10 mg total) by mouth 3 (three) times daily as needed for muscle spasms. 07/22/12  Yes Carmela Hurt, MD  diclofenac (VOLTAREN) 50 MG EC tablet Take 50 mg by mouth 2 (two) times daily.   Yes Historical Provider, MD  ibuprofen (ADVIL,MOTRIN) 800 MG tablet Take 800 mg by mouth every 8 (eight) hours as needed. For pain   Yes Historical Provider, MD  oxyCODONE-acetaminophen (ROXICET) 5-325 MG per tablet Take 1 tablet by mouth every 6 (six) hours as needed for pain. 07/22/12  Yes Carmela Hurt, MD    Physical Exam:  Filed Vitals:   08/25/12 1353  BP: 145/76  Pulse: 83  Resp: 16    GENERAL: Well-nourished, well-developed, in no acute distress CARDIOVASCULAR: Regular rate and rhythm.  RESPIRATORY: Respiratory effort is normal. Lungs clear to auscultation. ABDOMEN: Bowel sounds present. No masses or tenderness. WOUND: Clean, dry, and no signs of infection  Imaging Studies: PA/LAT CXR: decrease in right base atelectasis, no effusion, still with slight elevation of right hemidiaphragm, and no pneumothorax.  Impression/Plan: Alvin Critchley, Mr. Unrein is  surgically stable. He was instructed that provided he is not taking narcotics for pain and if ok with Dr. Franky Macho, he may begin driving short distances during the day (i.e. 30 minutes or less). He inquired about using a treadmill as well. He was instructed that as long as it is ok with Dr. Franky Macho, he may use this. I told him not to use an Elliptical for 1-2 more weeks. He has already seen Dr. Franky Macho in follow up last week and has another appointment to see him in 4 months. He will return to see Dr. Laneta Simmers on a PRN basis.   Doree Fudge, PA-C 08/25/2012 2:19 PM

## 2012-08-25 NOTE — Patient Instructions (Signed)
Thoracotomy Care After Refer to this sheet in the next few weeks. These instructions provide you with information on caring for yourself after your procedure. Your caregiver may also give you more specific instructions. Your treatment has been planned according to current medical practices, but problems sometimes occur. Call your caregiver if you have any problems or questions after your procedure. HOME CARE INSTRUCTIONS  Remove the bandage (dressing) over your chest tube site as directed by your caregiver.  It is normal to be sore for a couple weeks following surgery. See your caregiver if this seems to be getting worse rather than better.  Only take over-the-counter or prescription medicines for pain, discomfort, or fever as directed by your caregiver. It is very important to take pain medicine when you need it so that you will cough and breathe deeply enough to clear mucus (phlegm) and expand your lungs. This helps prevent a lung infection (pneumonia).  If it hurts to cough, hold a pillow against your chest when you cough. This may help with the discomfort. In spite of the discomfort, cough frequently.  Taking deep breaths keeps lungs inflated and protects against pneumonia. Most patients will go home with a device called an incentive spirometer that encourages deep breathing.  You may resume a normal diet and activities as directed.  Use showers for bathing until you see your caregiver, or as instructed.  Change dressings if necessary or as directed.  Avoid lifting or driving until you are instructed otherwise.  Make an appointment to see your caregiver for stitch (suture) or staple removal when instructed.  Do not travel by airplane for 2 weeks after the chest tube is removed. SEEK MEDICAL CARE IF:  You are bleeding from your wounds.  Your heartbeat seems irregular.  You have redness, swelling, or increasing pain in the wounds.  There is pus coming from your wounds.  There is  a bad smell coming from the wound or dressing. SEEK IMMEDIATE MEDICAL CARE IF:  You have a fever.  You develop a rash.  You have difficulty breathing.  You develop any reaction or side effects to medicines given.  You develop lightheadedness or feel faint.  You develop shortness of breath or chest pain. MAKE SURE YOU:  Understand these instructions.  Will watch your condition.  Will get help right away if you are not doing well or get worse. Document Released: 01/12/2011 Document Revised: 10/22/2011 Document Reviewed: 01/12/2011 ExitCare Patient Information 2013 [employer].  

## 2012-11-07 ENCOUNTER — Ambulatory Visit
Admission: RE | Admit: 2012-11-07 | Discharge: 2012-11-07 | Disposition: A | Discharge status: Home / Self Care | Payer: Medicare Other | Source: Ambulatory Visit | Attending: Neurosurgery | Admitting: Neurosurgery

## 2012-11-07 ENCOUNTER — Other Ambulatory Visit: Payer: Self-pay | Admitting: Neurosurgery

## 2012-11-07 DIAGNOSIS — M546 Pain in thoracic spine: Secondary | ICD-10-CM

## 2012-11-07 MED ORDER — GADOBENATE DIMEGLUMINE 529 MG/ML IV SOLN
20.0000 mL | Freq: Once | INTRAVENOUS | Status: AC | PRN
Start: 2012-11-07 — End: 2012-11-07
  Administered 2012-11-07: 20 mL via INTRAVENOUS

## 2012-12-01 ENCOUNTER — Encounter: Payer: Self-pay | Admitting: Neurosurgery

## 2012-12-11 ENCOUNTER — Encounter: Payer: Self-pay | Admitting: Neurosurgery

## 2013-01-11 ENCOUNTER — Encounter: Payer: Self-pay | Admitting: Neurosurgery

## 2013-01-22 DIAGNOSIS — Z0271 Encounter for disability determination: Secondary | ICD-10-CM

## 2013-02-10 ENCOUNTER — Encounter: Payer: Self-pay | Admitting: Neurosurgery

## 2013-03-17 ENCOUNTER — Other Ambulatory Visit: Payer: Self-pay | Admitting: Neurosurgery

## 2013-03-17 DIAGNOSIS — M47816 Spondylosis without myelopathy or radiculopathy, lumbar region: Secondary | ICD-10-CM

## 2013-03-21 ENCOUNTER — Ambulatory Visit
Admission: RE | Admit: 2013-03-21 | Discharge: 2013-03-21 | Disposition: A | Discharge status: Home / Self Care | Payer: Medicare Other | Source: Ambulatory Visit | Attending: Neurosurgery | Admitting: Neurosurgery

## 2013-03-21 DIAGNOSIS — M47816 Spondylosis without myelopathy or radiculopathy, lumbar region: Secondary | ICD-10-CM

## 2013-04-01 ENCOUNTER — Other Ambulatory Visit (HOSPITAL_COMMUNITY): Payer: Self-pay | Admitting: Neurosurgery

## 2013-04-01 DIAGNOSIS — M5104 Intervertebral disc disorders with myelopathy, thoracic region: Secondary | ICD-10-CM

## 2013-04-03 ENCOUNTER — Encounter (HOSPITAL_COMMUNITY): Payer: Self-pay | Admitting: Pharmacy Technician

## 2013-04-08 ENCOUNTER — Ambulatory Visit (HOSPITAL_COMMUNITY)
Admission: RE | Admit: 2013-04-08 | Discharge: 2013-04-08 | Disposition: A | Discharge status: Home / Self Care | Payer: Medicare Other | Source: Ambulatory Visit | Attending: Neurosurgery | Admitting: Neurosurgery

## 2013-04-08 VITALS — BP 121/64 | HR 74 | Temp 97.7°F | Resp 18 | Ht 69.0 in | Wt 280.0 lb

## 2013-04-08 DIAGNOSIS — M5104 Intervertebral disc disorders with myelopathy, thoracic region: Secondary | ICD-10-CM

## 2013-04-08 DIAGNOSIS — M4714 Other spondylosis with myelopathy, thoracic region: Secondary | ICD-10-CM | POA: Insufficient documentation

## 2013-04-08 MED ORDER — DIAZEPAM 5 MG PO TABS
10.0000 mg | ORAL_TABLET | Freq: Once | ORAL | Status: AC
  Administered 2013-04-08: 10 mg via ORAL

## 2013-04-08 MED ORDER — IOHEXOL 300 MG/ML  SOLN
10.0000 mL | Freq: Once | INTRAMUSCULAR | Status: AC | PRN
  Administered 2013-04-08: 10 mL via INTRATHECAL

## 2013-04-08 MED ORDER — OXYCODONE HCL 5 MG PO TABS
ORAL_TABLET | ORAL | Status: AC
  Filled 2013-04-08: qty 2

## 2013-04-08 MED ORDER — ONDANSETRON HCL 4 MG/2ML IJ SOLN: 4.0000 mg | Freq: Four times a day (QID) | INTRAMUSCULAR | Status: DC | PRN

## 2013-04-08 MED ORDER — OXYCODONE HCL 5 MG PO TABS
5.0000 mg | ORAL_TABLET | ORAL | Status: DC | PRN
  Administered 2013-04-08: 10 mg via ORAL

## 2013-04-15 NOTE — Op Note (Signed)
*   No surgery found * thoracic Myelogram  PATIENT:  Travis Fitzpatrick is a 32 y.o. male  PRE-OPERATIVE DIAGNOSIS:  Thoracic disc   POST-OPERATIVE DIAGNOSIS:  Thoracic disc  PROCEDURE:  Lumbar Myelogram  SURGEON:  Lennon Boutwell  ANESTHESIA:   local LOCAL MEDICATIONS USED:  LIDOCAINE  Procedure Note: Mr. Vanatta was positioned prone on the fluoroscopy table. His back was prepped and draped in a sterile manner. I introduced a spinal needle into the lumbar spine at L3/4 and had good back flow of csf. I infiltrated 10cc 300 omnipaque into the thecal sac. Xrays were taken, and he was taken to the CT suite to complete the study.    PATIENT DISPOSITION:  PACU - hemodynamically stable.

## 2014-09-09 ENCOUNTER — Other Ambulatory Visit (HOSPITAL_COMMUNITY): Payer: Self-pay | Admitting: Neurosurgery

## 2014-09-09 DIAGNOSIS — M5104 Intervertebral disc disorders with myelopathy, thoracic region: Secondary | ICD-10-CM

## 2014-09-22 ENCOUNTER — Ambulatory Visit (HOSPITAL_COMMUNITY)
Admission: RE | Admit: 2014-09-22 | Discharge: 2014-09-22 | Disposition: A | Discharge status: Home / Self Care | Payer: Medicare Other | Source: Ambulatory Visit | Attending: Neurosurgery | Admitting: Neurosurgery

## 2014-09-22 ENCOUNTER — Other Ambulatory Visit (HOSPITAL_COMMUNITY): Payer: Self-pay | Admitting: Neurosurgery

## 2014-09-22 VITALS — BP 125/61 | HR 52 | Temp 97.7°F | Resp 20 | Ht 69.0 in | Wt 290.0 lb

## 2014-09-22 DIAGNOSIS — Z981 Arthrodesis status: Secondary | ICD-10-CM | POA: Insufficient documentation

## 2014-09-22 DIAGNOSIS — M4804 Spinal stenosis, thoracic region: Secondary | ICD-10-CM | POA: Insufficient documentation

## 2014-09-22 DIAGNOSIS — M2578 Osteophyte, vertebrae: Secondary | ICD-10-CM | POA: Diagnosis not present

## 2014-09-22 DIAGNOSIS — M545 Low back pain, unspecified: Secondary | ICD-10-CM

## 2014-09-22 DIAGNOSIS — M4806 Spinal stenosis, lumbar region: Secondary | ICD-10-CM | POA: Diagnosis not present

## 2014-09-22 DIAGNOSIS — M5104 Intervertebral disc disorders with myelopathy, thoracic region: Secondary | ICD-10-CM | POA: Diagnosis present

## 2014-09-22 DIAGNOSIS — G9589 Other specified diseases of spinal cord: Secondary | ICD-10-CM | POA: Diagnosis not present

## 2014-09-22 DIAGNOSIS — M4324 Fusion of spine, thoracic region: Secondary | ICD-10-CM | POA: Diagnosis not present

## 2014-09-22 DIAGNOSIS — M546 Pain in thoracic spine: Secondary | ICD-10-CM | POA: Diagnosis present

## 2014-09-22 MED ORDER — DIAZEPAM 5 MG PO TABS
ORAL_TABLET | ORAL | Status: AC
  Filled 2014-09-22: qty 2

## 2014-09-22 MED ORDER — ONDANSETRON HCL 4 MG/2ML IJ SOLN: 4.0000 mg | Freq: Four times a day (QID) | INTRAMUSCULAR | Status: DC | PRN

## 2014-09-22 MED ORDER — IOHEXOL 300 MG/ML  SOLN
10.0000 mL | Freq: Once | INTRAMUSCULAR | Status: AC | PRN
  Administered 2014-09-22: 10 mL via INTRATHECAL

## 2014-09-22 MED ORDER — OXYCODONE HCL 5 MG PO TABS
5.0000 mg | ORAL_TABLET | ORAL | Status: DC | PRN
Start: 2014-09-22 — End: 2014-09-23
  Administered 2014-09-22 (×2): 5 mg via ORAL

## 2014-09-22 MED ORDER — ASPIRIN 81 MG PO CHEW
CHEWABLE_TABLET | ORAL | Status: AC
  Filled 2014-09-22: qty 1

## 2014-09-22 MED ORDER — DIAZEPAM 5 MG PO TABS
10.0000 mg | ORAL_TABLET | Freq: Once | ORAL | Status: AC
  Administered 2014-09-22: 10 mg via ORAL

## 2014-09-22 MED ORDER — OXYCODONE HCL 5 MG PO TABS
ORAL_TABLET | ORAL | Status: AC
  Administered 2014-09-22: 5 mg via ORAL
  Filled 2014-09-22: qty 1

## 2014-09-22 MED ORDER — OXYCODONE HCL 5 MG PO TABS
ORAL_TABLET | ORAL | Status: AC
  Filled 2014-09-22: qty 1

## 2014-09-22 NOTE — Discharge Instructions (Signed)
Myelography, Care After °These instructions give you information on caring for yourself after your procedure. Your doctor may also give you specific instructions. Call your doctor if you have any problems or questions after your procedure. °HOME CARE °· Rest often the first day. °· When you rest, lie flat, with your head slightly raised (elevated). °· Avoid heavy lifting and activity for 48 hours. °· You may take the bandage (dressing) off 1 day after the test. °GET HELP RIGHT AWAY IF:  °· You have a very bad headache. °· You have a fever. °MAKE SURE YOU: °· Understand these instructions. °· Will watch your condition. °· Will get help right away if you are not doing well or get worse. °Document Released: 05/08/2008 Document Revised: 12/14/2013 Document Reviewed: 11/18/2013 °ExitCare® Patient Information ©2015 [employer]. This information is not intended to replace advice given to you by your health care provider. Make sure you discuss any questions you have with your health care provider. ° ° °

## 2014-10-25 ENCOUNTER — Encounter
Admit: 2014-10-25 | Disposition: A | Discharge status: Home / Self Care | Payer: Self-pay | Attending: Neurosurgery | Admitting: Neurosurgery

## 2014-11-12 ENCOUNTER — Encounter
Admit: 2014-11-12 | Disposition: A | Discharge status: Home / Self Care | Payer: Self-pay | Attending: Neurosurgery | Admitting: Neurosurgery

## 2014-12-06 ENCOUNTER — Other Ambulatory Visit (HOSPITAL_COMMUNITY): Payer: Self-pay | Admitting: Neurosurgery

## 2014-12-11 NOTE — Pre-Procedure Instructions (Signed)
Travis Fitzpatrick  12/11/2014   Your procedure is scheduled on:  May 4  [facility].  Call this number if you have problems the morning of surgery: [phone]   Remember:   Do not eat food or drink liquids after midnight.   Take these medicines the morning of surgery with A SIP OF WATER: Zanaflex (if needed)   STOP Ibuprofen today   STOP/ Do not take Aspirin, Aleve, Naproxen, Advil, Ibuprofen, Motrin, Vitamins, Herbs, or Supplements starting today   Do not wear jewelry, make-up or nail polish.  Do not wear lotions, powders, or perfumes. You may wear deodorant.  Do not shave 48 hours prior to surgery. Men may shave face and neck.  Do not bring valuables to the hospital.  [facility] is not responsible for any belongings or valuables.               Contacts, dentures or bridgework may not be worn into surgery.  Leave suitcase in the car. After surgery it may be brought to your room.  For patients admitted to the hospital, discharge time is determined by your treatment team.               Special Instructions: [place] - Preparing for Surgery  Before surgery, you can play an important role.  Because skin is not sterile, your skin needs to be as free of germs as possible.  You can reduce the number of germs on you skin by washing with CHG (chlorahexidine gluconate) soap before surgery.  CHG is an antiseptic cleaner which kills germs and bonds with the skin to continue killing germs even after washing.  Please DO NOT use if you have an allergy to CHG or antibacterial soaps.  If your skin becomes reddened/irritated stop using the CHG and inform your nurse when you arrive at Short Stay.  Do not shave (including legs and underarms) for at least 48 hours prior to the first CHG shower.  You may shave your face.  Please follow these instructions carefully:   1.  Shower with CHG Soap the night before surgery and the morning of Surgery.  2.  If you  choose to wash your hair, wash your hair first as usual with your normal shampoo.  3.  After you shampoo, rinse your hair and body thoroughly to remove the shampoo.  4.  Use CHG as you would any other liquid soap.  You can apply CHG directly to the skin and wash gently with scrungie or a clean washcloth.  5.  Apply the CHG Soap to your body ONLY FROM THE NECK DOWN.  Do not use on open wounds or open sores.  Avoid contact with your eyes, ears, mouth and genitals (private parts).  Wash genitals (private parts) with your normal soap.  6.  Wash thoroughly, paying special attention to the area where your surgery will be performed.  7.  Thoroughly rinse your body with warm water from the neck down.  8.  DO NOT shower/wash with your normal soap after using and rinsing off the CHG Soap.  9.  Pat yourself dry with a clean towel.            10.  Wear clean pajamas.            11.  Place clean sheets on your bed the night of your first shower and do not sleep with pets.  Day of Surgery  Do  not apply any lotions the morning of surgery.  Please wear clean clothes to the hospital/surgery center.     Please read over the following fact sheets that you were given: Pain Booklet, Coughing and Deep Breathing and Surgical Site Infection Prevention

## 2014-12-13 ENCOUNTER — Encounter (HOSPITAL_COMMUNITY)
Admission: RE | Admit: 2014-12-13 | Discharge: 2014-12-13 | Disposition: A | Discharge status: Home / Self Care | Payer: Medicare Other | Source: Ambulatory Visit | Attending: Neurosurgery | Admitting: Neurosurgery

## 2014-12-13 ENCOUNTER — Encounter (HOSPITAL_COMMUNITY): Payer: Self-pay

## 2014-12-13 HISTORY — DX: Other specified health status: Z78.9

## 2014-12-13 LAB — BASIC METABOLIC PANEL
Anion gap: 6 (ref 5–15)
BUN: 10 mg/dL (ref 6–20)
CHLORIDE: 104 mmol/L (ref 101–111)
CO2: 29 mmol/L (ref 22–32)
Calcium: 9.4 mg/dL (ref 8.9–10.3)
Creatinine, Ser: 1.04 mg/dL (ref 0.61–1.24)
GFR calc Af Amer: >60 mL/min (ref >60)
Glucose, Bld: 93 mg/dL (ref 70–99)
POTASSIUM: 4.3 mmol/L (ref 3.5–5.1)
Sodium: 139 mmol/L (ref 135–145)

## 2014-12-13 LAB — CBC
HEMATOCRIT: 39.1 % (ref 39.0–52.0)
HEMOGLOBIN: 12.8 g/dL — AB (ref 13.0–17.0)
MCH: 31.4 pg (ref 26.0–34.0)
MCHC: 32.7 g/dL (ref 30.0–36.0)
MCV: 95.8 fL (ref 78.0–100.0)
Platelets: 194 10*3/uL (ref 150–400)
RBC: 4.08 MIL/uL — ABNORMAL LOW (ref 4.22–5.81)
RDW: 12.7 % (ref 11.5–15.5)
WBC: 5.5 10*3/uL (ref 4.0–10.5)

## 2014-12-13 LAB — SURGICAL PCR SCREEN
MRSA, PCR: NEGATIVE
STAPHYLOCOCCUS AUREUS: POSITIVE — AB

## 2014-12-14 ENCOUNTER — Ambulatory Visit: Payer: Medicare Other | Attending: Neurosurgery

## 2014-12-14 DIAGNOSIS — M5184 Other intervertebral disc disorders, thoracic region: Secondary | ICD-10-CM | POA: Insufficient documentation

## 2014-12-14 DIAGNOSIS — R2681 Unsteadiness on feet: Secondary | ICD-10-CM | POA: Insufficient documentation

## 2014-12-14 DIAGNOSIS — R531 Weakness: Secondary | ICD-10-CM | POA: Diagnosis not present

## 2014-12-14 DIAGNOSIS — R296 Repeated falls: Secondary | ICD-10-CM | POA: Diagnosis not present

## 2014-12-14 NOTE — Therapy (Signed)
[place] MAIN [facility] SERVICES [address] [place], [place], [address] Phone: [phone]   Fax:  [phone]  Physical Therapy Treatment  Patient Details  Name: Travis Fitzpatrick MRN: [id number] Date of Birth: 10-Jan-1981 Referring Provider:  Alroy Dust, L.Marlou Sa, MD  Encounter Date: 12/14/2014      PT End of Session - 12/14/14 1240    Visit Number 14   Number of Visits 20   Date for PT Re-Evaluation 12/14/14   Authorization Type 4 visits since last Gcode   PT Start Time 1117   PT Stop Time 1155   PT Time Calculation (min) 38 min   Equipment Utilized During Treatment Gait belt   Activity Tolerance Patient limited by pain   Behavior During Therapy [name] for tasks assessed/performed      Past Medical History  Diagnosis Date  . Urinary urgency   . Bowel trouble     urgency  . Medical history non-contributory     Past Surgical History  Procedure Laterality Date  . Back surgery  2010  . Circumcision    . Lumbar laminectomy/decompression microdiscectomy  07/20/2011    Procedure: LUMBAR LAMINECTOMY/DECOMPRESSION MICRODISCECTOMY;  Surgeon: Winfield Cunas;  Location: [place] [facility];  Service: Neurosurgery;  Laterality: N/A;  right thoracotomy with thoracic eight-nine discectomy and fusion  . Thoracotomy  07/20/2011    Procedure: THORACOTOMY OPEN FOR SPINE SURGERY;  Surgeon: Pierre Bali, MD;  Location: [facility];  Service: Vascular;  Laterality: N/A;  . Thoracic discectomy  07/16/2012    Procedure: THORACIC DISCECTOMY;  Surgeon: Winfield Cunas, MD;  Location: [place] [facility];  Service: Neurosurgery;  Laterality: Right;  RIGHT Thoracic seven-eight  thoracic diskectomy via thoracotomy by dr Cyndia Bent  . Thoracotomy  07/16/2012    Procedure: THORACOTOMY OPEN FOR SPINE SURGERY;  Surgeon: Gaye Pollack, MD;  Location: [facility];  Service: Thoracic;  Laterality: N/A;    There were no vitals filed for this visit.  Visit Diagnosis:  Falls  frequently  Weakness      Subjective Assessment - 12/14/14 1233    Subjective pt reports his back is hurting 7/10. reports his balance is much better and is happy with progress. is having back surgery tomorrow to remove "fatty tissue"   Currently in Pain? Yes   Pain Score 7    Pain Location Back   Pain Descriptors / Indicators Aching            [facility] PT Assessment - 12/14/14 0001    Assessment   Medical Diagnosis PT gait training    Onset Date 09/23/14   Next MD Visit 12/15/14   Precautions   Precautions Fall   Restrictions   Weight Bearing Restrictions No   Balance Screen   Has the patient fallen in the past 6 months Yes   How many times? 5   Has the patient had a decrease in activity level because of a fear of falling?  No   Is the patient reluctant to leave their home because of a fear of falling?  No   Prior Function   Level of Independence Independent with basic ADLs;Independent with gait   Sensation   Light Touch Impaired Detail   Light Touch Impaired Details Absent RLE;Absent LLE   Proprioception Impaired Detail   Proprioception Impaired Details Absent RLE;Absent LLE   Transfers   Sit to Stand --  5x sit to stand 16s   Ambulation/Gait   Ambulation/Gait Yes  Ambulation/Gait Assistance 4: Min guard   Ambulation Distance (Feet) 1000 Feet   Assistive device None  pt refuses device   Gait Pattern Step-through pattern   Ambulation Surface Level   Gait velocity 0.11ms   High Level Balance   High Level Balance Comments --  pt able to stand with eyes closed x 30s with supervision.      Todays treatment: Tandem walking with 10-20s hold in each position 130fx 3 Ladder drills x 10 min  Standing with wide BOS EO/EC 30s  X 5 5x sit to stand test: 16s Seated TA contraction with march 2x10 walking with L/R and vertical head turns 10 m x 5 laps                       PT Education - 0531-May-2016239    Education provided Yes   Education Details  instructed pt on seated march with TA contraction, also reviewed HEP progression regarding balance with eyes closed and turning head as tolerated.    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    Time 2   Period Weeks   Status Achieved           PT Long Term Goals - 05May 31, 2016244    PT LONG TERM GOAL #1   Title pt will increase berg balance score by >6pts to demonstate decreased fall risk during functional activiites.    Time 6   Period Weeks   Status Achieved   PT LONG TERM GOAL #2   Title pt will complete 5x sit to stand test in <10s indicating an increased LE strength and improved balance    Time 6   Status Partially Met   PT LONG TERM GOAL #3   Title pt will be able to ambulate >1000 ft with LRAD for safety with independent community ambulation   Time 6   Period Weeks   Status Achieved               Plan - 05May 31, 2016241    Clinical Impression Statement pt has achieved majority of PT goals at this time and has made good progress regarding balance and LE strength and had reduced his fall frequency. pt still refuses to use AD, however is still unsteady on his feet. pt will be DC today to HEP and due  to having back surgery tomorrow to remove fatty deposits and scar tissue.    Pt will benefit from skilled therapeutic intervention in order to improve on the following deficits Decreased strength;Decreased balance   Rehab Potential Good   PT Frequency 2x / week   PT Duration 6 weeks   PT Treatment/Interventions Gait training;Neuromuscular re-education;Cryotherapy;Therapeutic  activities;Manual techniques;Therapeutic exercise;Electrical Stimulation;Moist Heat;Balance training   Consulted and Agree with Plan of Care Patient          G-Codes - 052016/05/31246    Functional Assessment Tool Used 5x sit to stand/ clinical judgement  Functional Limitation Mobility: Walking and moving around   Mobility: Walking and Moving Around Current Status [phone]) At least 20 percent but less than 40 percent impaired, limited or restricted   Mobility: Walking and Moving Around Goal Status [phone]) At least 1 percent but less than 20 percent impaired, limited or restricted   Mobility: Walking and Moving Around Discharge Status [phone]) At least 20 percent but less than 40 percent impaired, limited or restricted      Problem List Patient Active Problem List   Diagnosis Date Noted  . 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/2011   PHYSICAL THERAPY DISCHARGE SUMMARY  Visits from Start of Care: 14 Current functional level related to goals / functional outcomes: Improved    Remaining deficits: Back pain/ balance impairment, lack of sensation below waist   Education / Equipment: Progressed HEP Plan: Patient agrees to discharge.  Patient goals were partially met. Patient is being discharged due to a change in medical status.  ?????      Gorden Harms. Yoshiye Kraft, PT, DPT [phone]  Ryatt Corsino 12/14/2014, 12:49 PM  [place] MAIN [facility] SERVICES [address] [place], [place], [address] Phone: [phone]   Fax:  [phone]

## 2014-12-15 ENCOUNTER — Ambulatory Visit (HOSPITAL_COMMUNITY): Payer: Medicare Other | Admitting: Certified Registered Nurse Anesthetist

## 2014-12-15 ENCOUNTER — Ambulatory Visit (HOSPITAL_COMMUNITY): Payer: Medicare Other

## 2014-12-15 ENCOUNTER — Encounter (HOSPITAL_COMMUNITY): Payer: Self-pay | Admitting: Certified Registered Nurse Anesthetist

## 2014-12-15 ENCOUNTER — Encounter (HOSPITAL_COMMUNITY)
Admission: RE | Disposition: A | Discharge status: Home / Self Care | Payer: Self-pay | Source: Ambulatory Visit | Attending: Neurosurgery

## 2014-12-15 ENCOUNTER — Inpatient Hospital Stay (HOSPITAL_COMMUNITY)
Admission: RE | Admit: 2014-12-15 | Discharge: 2014-12-17 | DRG: 519 | Disposition: A | Discharge status: Home / Self Care | Payer: Medicare Other | Source: Ambulatory Visit | Attending: Neurosurgery | Admitting: Neurosurgery

## 2014-12-15 DIAGNOSIS — Z888 Allergy status to other drugs, medicaments and biological substances status: Secondary | ICD-10-CM

## 2014-12-15 DIAGNOSIS — Z981 Arthrodesis status: Secondary | ICD-10-CM

## 2014-12-15 DIAGNOSIS — Z91018 Allergy to other foods: Secondary | ICD-10-CM | POA: Diagnosis not present

## 2014-12-15 DIAGNOSIS — Z419 Encounter for procedure for purposes other than remedying health state, unspecified: Secondary | ICD-10-CM

## 2014-12-15 DIAGNOSIS — M4804 Spinal stenosis, thoracic region: Principal | ICD-10-CM | POA: Diagnosis present

## 2014-12-15 DIAGNOSIS — E882 Lipomatosis, not elsewhere classified: Secondary | ICD-10-CM | POA: Diagnosis present

## 2014-12-15 DIAGNOSIS — Z6841 Body Mass Index (BMI) 40.0 and over, adult: Secondary | ICD-10-CM | POA: Diagnosis not present

## 2014-12-15 HISTORY — PX: THORACIC DISCECTOMY: SHX6113

## 2014-12-15 SURGERY — THORACIC DISCECTOMY
Anesthesia: General

## 2014-12-15 MED ORDER — LIDOCAINE HCL (CARDIAC) 20 MG/ML IV SOLN
INTRAVENOUS | Status: AC
  Filled 2014-12-15: qty 5

## 2014-12-15 MED ORDER — MIDAZOLAM HCL 2 MG/2ML IJ SOLN
INTRAMUSCULAR | Status: AC
  Filled 2014-12-15: qty 2

## 2014-12-15 MED ORDER — FENTANYL CITRATE (PF) 250 MCG/5ML IJ SOLN
INTRAMUSCULAR | Status: AC
  Filled 2014-12-15: qty 5

## 2014-12-15 MED ORDER — BUPIVACAINE HCL (PF) 0.5 % IJ SOLN
INTRAMUSCULAR | Status: DC | PRN
  Administered 2014-12-15: 30 mL

## 2014-12-15 MED ORDER — ARTIFICIAL TEARS OP OINT
TOPICAL_OINTMENT | OPHTHALMIC | Status: DC | PRN
  Administered 2014-12-15: 1 via OPHTHALMIC

## 2014-12-15 MED ORDER — LIDOCAINE HCL (CARDIAC) 20 MG/ML IV SOLN
INTRAVENOUS | Status: DC | PRN
  Administered 2014-12-15: 50 mg via INTRAVENOUS

## 2014-12-15 MED ORDER — MENTHOL 3 MG MT LOZG: 1.0000 | LOZENGE | OROMUCOSAL | Status: DC | PRN

## 2014-12-15 MED ORDER — GLYCOPYRROLATE 0.2 MG/ML IJ SOLN
INTRAMUSCULAR | Status: DC | PRN
  Administered 2014-12-15: 0.4 mg via INTRAVENOUS

## 2014-12-15 MED ORDER — HEMOSTATIC AGENTS (NO CHARGE) OPTIME
TOPICAL | Status: DC | PRN
Start: 2014-12-15 — End: 2014-12-15
  Administered 2014-12-15: 1 via TOPICAL

## 2014-12-15 MED ORDER — DEXAMETHASONE SODIUM PHOSPHATE 10 MG/ML IJ SOLN
INTRAMUSCULAR | Status: AC
  Filled 2014-12-15: qty 1

## 2014-12-15 MED ORDER — SODIUM CHLORIDE 0.9 % IJ SOLN
3.0000 mL | Freq: Two times a day (BID) | INTRAMUSCULAR | Status: DC
  Administered 2014-12-15 – 2014-12-16 (×3): 3 mL via INTRAVENOUS

## 2014-12-15 MED ORDER — MIDAZOLAM HCL 5 MG/5ML IJ SOLN
INTRAMUSCULAR | Status: DC | PRN
  Administered 2014-12-15: 2 mg via INTRAVENOUS

## 2014-12-15 MED ORDER — THROMBIN 5000 UNITS EX SOLR
CUTANEOUS | Status: DC | PRN
  Administered 2014-12-15 (×2): 5000 [IU] via TOPICAL

## 2014-12-15 MED ORDER — ACETAMINOPHEN 325 MG PO TABS: 650.0000 mg | ORAL_TABLET | ORAL | Status: DC | PRN

## 2014-12-15 MED ORDER — PROPOFOL 10 MG/ML IV BOLUS
INTRAVENOUS | Status: AC
  Filled 2014-12-15: qty 20

## 2014-12-15 MED ORDER — POTASSIUM CHLORIDE IN NACL 20-0.9 MEQ/L-% IV SOLN
INTRAVENOUS | Status: DC
  Administered 2014-12-15: 16:00:00 via INTRAVENOUS
  Filled 2014-12-15 (×2): qty 1000

## 2014-12-15 MED ORDER — SUCCINYLCHOLINE CHLORIDE 20 MG/ML IJ SOLN
INTRAMUSCULAR | Status: AC
  Filled 2014-12-15: qty 1

## 2014-12-15 MED ORDER — ROCURONIUM BROMIDE 100 MG/10ML IV SOLN
INTRAVENOUS | Status: DC | PRN
  Administered 2014-12-15: 50 mg via INTRAVENOUS

## 2014-12-15 MED ORDER — BACITRACIN ZINC 500 UNIT/GM EX OINT
TOPICAL_OINTMENT | CUTANEOUS | Status: DC | PRN
  Administered 2014-12-15: 1 via TOPICAL

## 2014-12-15 MED ORDER — TIZANIDINE HCL 2 MG PO TABS
2.0000 mg | ORAL_TABLET | Freq: Four times a day (QID) | ORAL | Status: DC | PRN
  Administered 2014-12-15 – 2014-12-17 (×2): 2 mg via ORAL
  Filled 2014-12-15 (×3): qty 1

## 2014-12-15 MED ORDER — ALBUMIN HUMAN 5 % IV SOLN
INTRAVENOUS | Status: DC | PRN
Start: 2014-12-15 — End: 2014-12-15
  Administered 2014-12-15: 13:00:00 via INTRAVENOUS

## 2014-12-15 MED ORDER — LIDOCAINE-EPINEPHRINE 0.5 %-1:200000 IJ SOLN
INTRAMUSCULAR | Status: DC | PRN
  Administered 2014-12-15: 10 mL

## 2014-12-15 MED ORDER — LACTATED RINGERS IV SOLN
INTRAVENOUS | Status: DC | PRN
  Administered 2014-12-15 (×2): via INTRAVENOUS

## 2014-12-15 MED ORDER — ONDANSETRON HCL 4 MG/2ML IJ SOLN
INTRAMUSCULAR | Status: DC | PRN
  Administered 2014-12-15: 4 mg via INTRAVENOUS

## 2014-12-15 MED ORDER — KETOROLAC TROMETHAMINE 30 MG/ML IJ SOLN
30.0000 mg | Freq: Four times a day (QID) | INTRAMUSCULAR | Status: DC
  Administered 2014-12-15 – 2014-12-17 (×8): 30 mg via INTRAVENOUS
  Filled 2014-12-15 (×8): qty 1

## 2014-12-15 MED ORDER — CEFAZOLIN SODIUM-DEXTROSE 2-3 GM-% IV SOLR
INTRAVENOUS | Status: AC
  Filled 2014-12-15: qty 50

## 2014-12-15 MED ORDER — PROPOFOL 10 MG/ML IV BOLUS
INTRAVENOUS | Status: DC | PRN
  Administered 2014-12-15: 200 mg via INTRAVENOUS
  Administered 2014-12-15: 80 mg via INTRAVENOUS

## 2014-12-15 MED ORDER — STERILE WATER FOR INJECTION IJ SOLN
INTRAMUSCULAR | Status: AC
  Filled 2014-12-15: qty 10

## 2014-12-15 MED ORDER — NEOSTIGMINE METHYLSULFATE 10 MG/10ML IV SOLN
INTRAVENOUS | Status: DC | PRN
  Administered 2014-12-15: 3 mg via INTRAVENOUS

## 2014-12-15 MED ORDER — CEFAZOLIN SODIUM-DEXTROSE 2-3 GM-% IV SOLR
INTRAVENOUS | Status: DC | PRN
Start: 2014-12-15 — End: 2014-12-15
  Administered 2014-12-15: 2 g via INTRAVENOUS

## 2014-12-15 MED ORDER — BISACODYL 5 MG PO TBEC: 5.0000 mg | DELAYED_RELEASE_TABLET | Freq: Every day | ORAL | Status: DC | PRN

## 2014-12-15 MED ORDER — ROCURONIUM BROMIDE 50 MG/5ML IV SOLN
INTRAVENOUS | Status: AC
  Filled 2014-12-15: qty 1

## 2014-12-15 MED ORDER — 0.9 % SODIUM CHLORIDE (POUR BTL) OPTIME
TOPICAL | Status: DC | PRN
  Administered 2014-12-15: 1000 mL

## 2014-12-15 MED ORDER — LACTATED RINGERS IV SOLN: INTRAVENOUS | Status: DC

## 2014-12-15 MED ORDER — DEXAMETHASONE SODIUM PHOSPHATE 4 MG/ML IJ SOLN
INTRAMUSCULAR | Status: DC | PRN
  Administered 2014-12-15: 6 mg via INTRAVENOUS
  Administered 2014-12-15: 4 mg via INTRAVENOUS

## 2014-12-15 MED ORDER — ONDANSETRON HCL 4 MG/2ML IJ SOLN: 4.0000 mg | INTRAMUSCULAR | Status: DC | PRN

## 2014-12-15 MED ORDER — DOCUSATE SODIUM 100 MG PO CAPS
100.0000 mg | ORAL_CAPSULE | Freq: Two times a day (BID) | ORAL | Status: DC
  Administered 2014-12-15 – 2014-12-17 (×5): 100 mg via ORAL
  Filled 2014-12-15 (×5): qty 1

## 2014-12-15 MED ORDER — HYDROMORPHONE HCL 1 MG/ML IJ SOLN
0.2500 mg | INTRAMUSCULAR | Status: DC | PRN
  Administered 2014-12-15 (×2): 0.5 mg via INTRAVENOUS

## 2014-12-15 MED ORDER — GLYCOPYRROLATE 0.2 MG/ML IJ SOLN
INTRAMUSCULAR | Status: AC
  Filled 2014-12-15: qty 3

## 2014-12-15 MED ORDER — ACETAMINOPHEN 650 MG RE SUPP: 650.0000 mg | RECTAL | Status: DC | PRN

## 2014-12-15 MED ORDER — SENNOSIDES-DOCUSATE SODIUM 8.6-50 MG PO TABS: 1.0000 | ORAL_TABLET | Freq: Every evening | ORAL | Status: DC | PRN

## 2014-12-15 MED ORDER — EPHEDRINE SULFATE 50 MG/ML IJ SOLN
INTRAMUSCULAR | Status: AC
  Filled 2014-12-15: qty 1

## 2014-12-15 MED ORDER — ARTIFICIAL TEARS OP OINT
TOPICAL_OINTMENT | OPHTHALMIC | Status: AC
  Filled 2014-12-15: qty 3.5

## 2014-12-15 MED ORDER — HYDROMORPHONE HCL 1 MG/ML IJ SOLN
INTRAMUSCULAR | Status: AC
  Filled 2014-12-15: qty 1

## 2014-12-15 MED ORDER — SODIUM CHLORIDE 0.9 % IV SOLN: 250.0000 mL | INTRAVENOUS | Status: DC

## 2014-12-15 MED ORDER — FENTANYL CITRATE (PF) 100 MCG/2ML IJ SOLN
INTRAMUSCULAR | Status: DC | PRN
  Administered 2014-12-15 (×4): 50 ug via INTRAVENOUS

## 2014-12-15 MED ORDER — ONDANSETRON HCL 4 MG/2ML IJ SOLN
INTRAMUSCULAR | Status: AC
  Filled 2014-12-15: qty 2

## 2014-12-15 MED ORDER — PHENOL 1.4 % MT LIQD: 1.0000 | OROMUCOSAL | Status: DC | PRN

## 2014-12-15 MED ORDER — SODIUM CHLORIDE 0.9 % IJ SOLN: 3.0000 mL | INTRAMUSCULAR | Status: DC | PRN

## 2014-12-15 MED ORDER — DEXAMETHASONE SODIUM PHOSPHATE 4 MG/ML IJ SOLN
INTRAMUSCULAR | Status: AC
  Filled 2014-12-15: qty 1

## 2014-12-15 MED ORDER — MAGNESIUM CITRATE PO SOLN
1.0000 | Freq: Once | ORAL | Status: AC | PRN
  Filled 2014-12-15: qty 296

## 2014-12-15 MED ORDER — HYDROCODONE-ACETAMINOPHEN 5-325 MG PO TABS
1.0000 | ORAL_TABLET | ORAL | Status: DC | PRN
  Administered 2014-12-15 – 2014-12-17 (×6): 2 via ORAL
  Filled 2014-12-15 (×6): qty 2

## 2014-12-15 MED ORDER — DEXAMETHASONE SODIUM PHOSPHATE 4 MG/ML IJ SOLN
INTRAMUSCULAR | Status: AC
  Filled 2014-12-15: qty 2

## 2014-12-15 MED ORDER — NEOSTIGMINE METHYLSULFATE 10 MG/10ML IV SOLN
INTRAVENOUS | Status: AC
  Filled 2014-12-15: qty 4

## 2014-12-15 MED ORDER — ONDANSETRON HCL 4 MG/2ML IJ SOLN: 4.0000 mg | Freq: Once | INTRAMUSCULAR | Status: DC | PRN

## 2014-12-15 SURGICAL SUPPLY — 62 items
BAG DECANTER FOR FLEXI CONT (MISCELLANEOUS) IMPLANT
BENZOIN TINCTURE PRP APPL 2/3 (GAUZE/BANDAGES/DRESSINGS) IMPLANT
BLADE CLIPPER SURG (BLADE) ×3 IMPLANT
BRUSH SCRUB EZ PLAIN DRY (MISCELLANEOUS) ×3 IMPLANT
BUR ACORN 6.0 (BURR) ×2 IMPLANT
BUR ACORN 6.0MM (BURR) ×1
BUR MATCHSTICK NEURO 3.0 LAGG (BURR) ×3 IMPLANT
BUR PRECISION FLUTE 5.0 (BURR) ×3 IMPLANT
CANISTER SUCT 3000ML PPV (MISCELLANEOUS) ×3 IMPLANT
CLOSURE WOUND 1/2 X4 (GAUZE/BANDAGES/DRESSINGS) ×1
CONT SPEC 4OZ CLIKSEAL STRL BL (MISCELLANEOUS) ×3 IMPLANT
DECANTER SPIKE VIAL GLASS SM (MISCELLANEOUS) ×3 IMPLANT
DRAPE LAPAROTOMY 100X72X124 (DRAPES) ×3 IMPLANT
DRAPE MICROSCOPE LEICA (MISCELLANEOUS) IMPLANT
DRAPE POUCH INSTRU U-SHP 10X18 (DRAPES) ×3 IMPLANT
DRAPE SURG 17X23 STRL (DRAPES) ×3 IMPLANT
DRSG OPSITE POSTOP 4X8 (GAUZE/BANDAGES/DRESSINGS) ×3 IMPLANT
ELECT BLADE 4.0 EZ CLEAN MEGAD (MISCELLANEOUS)
ELECT REM PT RETURN 9FT ADLT (ELECTROSURGICAL) ×3
ELECTRODE BLDE 4.0 EZ CLN MEGD (MISCELLANEOUS) IMPLANT
ELECTRODE REM PT RTRN 9FT ADLT (ELECTROSURGICAL) ×1 IMPLANT
GAUZE SPONGE 4X4 12PLY STRL (GAUZE/BANDAGES/DRESSINGS) ×3 IMPLANT
GAUZE SPONGE 4X4 16PLY XRAY LF (GAUZE/BANDAGES/DRESSINGS) IMPLANT
GLOVE BIO SURGEON STRL SZ 6.5 (GLOVE) ×2 IMPLANT
GLOVE BIO SURGEON STRL SZ7.5 (GLOVE) ×3 IMPLANT
GLOVE BIO SURGEON STRL SZ8 (GLOVE) ×3 IMPLANT
GLOVE BIO SURGEONS STRL SZ 6.5 (GLOVE) ×1
GLOVE ECLIPSE 6.5 STRL STRAW (GLOVE) ×6 IMPLANT
GLOVE EXAM NITRILE LRG STRL (GLOVE) IMPLANT
GLOVE EXAM NITRILE MD LF STRL (GLOVE) IMPLANT
GLOVE EXAM NITRILE XL STR (GLOVE) IMPLANT
GLOVE EXAM NITRILE XS STR PU (GLOVE) IMPLANT
GLOVE INDICATOR 7.5 STRL GRN (GLOVE) ×3 IMPLANT
GLOVE SS BIOGEL STRL SZ 8 (GLOVE) ×1 IMPLANT
GLOVE SUPERSENSE BIOGEL SZ 8 (GLOVE) ×2
GOWN STRL REUS W/ TWL LRG LVL3 (GOWN DISPOSABLE) ×2 IMPLANT
GOWN STRL REUS W/ TWL XL LVL3 (GOWN DISPOSABLE) ×1 IMPLANT
GOWN STRL REUS W/TWL 2XL LVL3 (GOWN DISPOSABLE) IMPLANT
GOWN STRL REUS W/TWL LRG LVL3 (GOWN DISPOSABLE) ×4
GOWN STRL REUS W/TWL XL LVL3 (GOWN DISPOSABLE) ×2
KIT BASIN OR (CUSTOM PROCEDURE TRAY) ×3 IMPLANT
KIT ROOM TURNOVER OR (KITS) ×3 IMPLANT
LIQUID BAND (GAUZE/BANDAGES/DRESSINGS) ×3 IMPLANT
NEEDLE HYPO 21X1.5 SAFETY (NEEDLE) IMPLANT
NEEDLE HYPO 22GX1.5 SAFETY (NEEDLE) ×3 IMPLANT
NEEDLE SPNL 18GX3.5 QUINCKE PK (NEEDLE) ×3 IMPLANT
NS IRRIG 1000ML POUR BTL (IV SOLUTION) ×3 IMPLANT
PACK LAMINECTOMY NEURO (CUSTOM PROCEDURE TRAY) ×3 IMPLANT
PAD ARMBOARD 7.5X6 YLW CONV (MISCELLANEOUS) ×9 IMPLANT
PATTIES SURGICAL .5 X1 (DISPOSABLE) IMPLANT
RUBBERBAND STERILE (MISCELLANEOUS) ×6 IMPLANT
SPONGE SURGIFOAM ABS GEL SZ50 (HEMOSTASIS) ×3 IMPLANT
STAPLER SKIN PROX WIDE 3.9 (STAPLE) ×3 IMPLANT
STRIP CLOSURE SKIN 1/2X4 (GAUZE/BANDAGES/DRESSINGS) ×2 IMPLANT
SUT VIC AB 1 CT1 18XBRD ANBCTR (SUTURE) ×2 IMPLANT
SUT VIC AB 1 CT1 8-18 (SUTURE) ×4
SUT VIC AB 2-0 CP2 18 (SUTURE) ×6 IMPLANT
SYR 20CC LL (SYRINGE) IMPLANT
SYR 20ML ECCENTRIC (SYRINGE) ×3 IMPLANT
TOWEL OR 17X24 6PK STRL BLUE (TOWEL DISPOSABLE) ×3 IMPLANT
TOWEL OR 17X26 10 PK STRL BLUE (TOWEL DISPOSABLE) ×3 IMPLANT
WATER STERILE IRR 1000ML POUR (IV SOLUTION) ×3 IMPLANT

## 2014-12-15 NOTE — Progress Notes (Signed)
Received patient to 4N32 at 1505, post thoracic 7-9 Laminectomy. Patient awake and oriented with no complaints of pain at this time. Oriented to room and made aware of safety precautions. Bed alarm on and call bell in place.

## 2014-12-15 NOTE — Transfer of Care (Signed)
Immediate Anesthesia Transfer of Care Note  Patient: Travis Fitzpatrick  Procedure(s) Performed: Procedure(s): THORACIC SEVEN TO THORACIC NINE Laminectomy  (N/A)  Patient Location: PACU  Anesthesia Type:General  Level of Consciousness: awake, alert  and oriented  Airway & Oxygen Therapy: Patient Spontanous Breathing and Patient connected to nasal cannula oxygen  Post-op Assessment: Report given to RN, Post -op Vital signs reviewed and stable and Patient moving all extremities X 4  Post vital signs: Reviewed and stable  Last Vitals:  Filed Vitals:   12/15/14 0803  BP: 126/64  Pulse: 83  Temp: 36.8 C  Resp: 16    Complications: No apparent anesthesia complications

## 2014-12-15 NOTE — H&P (Signed)
  [name] returned today. He underwent physical therapy and says that he still has this feeling of pressure in and around his chest and that his back will give out. He stated he needed to use the walker the weekend before last because he just was that unstable. He has some epidural lipomatosis, which I would not have thought to do this much. Allergies  Allergen Reactions  . Aspirin Rash  . Other Itching    Apples,oranges, grapes (skin on outside)-  throat itches   Prior to Admission medications   Medication Sig Start Date End Date Taking? Authorizing Provider  ibuprofen (ADVIL,MOTRIN) 800 MG tablet Take 800 mg by mouth every 6 (six) hours as needed for pain.    Yes Historical Provider, MD  tiZANidine (ZANAFLEX) 2 MG tablet Take 2 mg by mouth every 6 (six) hours as needed (muscle spasms).   Yes Historical Provider, MD   Past Medical History  Diagnosis Date  . Urinary urgency   . Bowel trouble     urgency  . Medical history non-contributory    Past Surgical History  Procedure Laterality Date  . Back surgery  2010  . Circumcision    . Lumbar laminectomy/decompression microdiscectomy  07/20/2011    Procedure: LUMBAR LAMINECTOMY/DECOMPRESSION MICRODISCECTOMY;  Surgeon: Carmela HurtKyle L Kamare Caspers;  Location: [facility];  Service: Neurosurgery;  Laterality: N/A;  right thoracotomy with thoracic eight-nine discectomy and fusion  . Thoracotomy  07/20/2011    Procedure: THORACOTOMY OPEN FOR SPINE SURGERY;  Surgeon: Norton Blizzard Patrick Burney, MD;  Location: [facility];  Service: Vascular;  Laterality: N/A;  . Thoracic discectomy  07/16/2012    Procedure: THORACIC DISCECTOMY;  Surgeon: Carmela HurtKyle L Tammie Ellsworth, MD;  Location: [facility];  Service: Neurosurgery;  Laterality: Right;  RIGHT Thoracic seven-eight  thoracic diskectomy via thoracotomy by dr Laneta Simmersbartle  . Thoracotomy  07/16/2012    Procedure: THORACOTOMY OPEN FOR SPINE SURGERY;  Surgeon: Alleen BorneBryan K Bartle, MD;  Location: [facility];  Service: Thoracic;  Laterality: N/A;    Family History  Problem Relation Age of Onset  . Hypertension Father    History   Social History  . Marital Status: Single    Spouse Name: N/A  . Number of Children: N/A  . Years of Education: N/A   Occupational History  . Not on file.   Social History Main Topics  . Smoking status: Never Smoker   . Smokeless tobacco: Never Used  . Alcohol Use: No  . Drug Use: No  . Sexual Activity: No   Other Topics Concern  . Not on file   Social History Narrative     PHYSICAL EXAMINATION: Vital signs today: Height 5 feet 8 inches, weight 297.8, blood pressure is 120/74, pulse 88, respiratory rate 15, temperature 98.2. Pain 5/10.  IMPRESSION/PLAN: I am still not going to say that I am sure it is, but it is present. It does cause some stenosis in the T9, T7-8 area. I will, therefore, go ahead and we will have him undergo a thoracic laminectomy. We can decompress this. He is fused through that area anteriorly at this point and there is no anterior compression. Risks and benefits were explained in detail. He has undergone this numerous times via an anterior approach. This was a much small procedure, but he just persists in stating that he feels this pressure and that it feels like a nerve is being pinched. He is controlling his pain at this point with ibuprofen and tizanidine.

## 2014-12-15 NOTE — Anesthesia Postprocedure Evaluation (Signed)
  Anesthesia Post-op Note  Patient: Travis Fitzpatrick  Procedure(s) Performed: Procedure(s): THORACIC SEVEN TO THORACIC NINE Laminectomy  (N/A)  Patient Location: PACU  Anesthesia Type:General  Level of Consciousness: awake, alert  and oriented  Airway and Oxygen Therapy: Patient Spontanous Breathing and Patient connected to nasal cannula oxygen  Post-op Pain: mild  Post-op Assessment: Post-op Vital signs reviewed, Patient's Cardiovascular Status Stable, Respiratory Function Stable, Patent Airway and Pain level controlled  Post-op Vital Signs: stable  Last Vitals:  Filed Vitals:   12/15/14 1432  BP: 135/78  Pulse: 84  Temp: 36.8 C  Resp: 14    Complications: No apparent anesthesia complications

## 2014-12-15 NOTE — Op Note (Signed)
12/15/2014  1:42 PM  PATIENT:  Travis Fitzpatrick  34 y.o. 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 male He was prepped and draped in a sterile manner. I infiltrated 10cc lidocaine into the planned thoracic incision. I opened the skin with a 10 blade and took the incision down to the thoracolumbar fascia. I exposed the lamina of T8,9, and 10 bilaterally. I confirmed my location with an xray. I then proceeded with a T8,9, and 10 laminectomy. Dr. Wynetta Emeryram and I decompressed the spinal canal from T8-10. The lipomatosis was actually more impressive than I thought it would be. I removed the fat, and lateral bone. The T9, and T10 nerve roots were well decompressed. The thecal sac  was pulsatile within the spinal canal. We irrigated, achieved hemostasis, and then closed.  I used vicryl sutures to approximate the thoracolumbar fascia, subcutaneous, and subcuticular layers. I used staples on the skin edges.  A sterile dressing was applied. He was extubated after being rolled off the wilson frame.   PLAN OF CARE: Admit to inpatient   PATIENT DISPOSITION:  PACU - hemodynamically stable.   Delay start of Pharmacological VTE agent (>24hrs) due to surgical blood loss or risk of bleeding:  yes

## 2014-12-15 NOTE — Anesthesia Preprocedure Evaluation (Addendum)
Anesthesia Evaluation  Patient identified by MRN, date of birth, ID band Patient awake    Reviewed: Allergy & Precautions, NPO status , Patient's Chart, lab work & pertinent test results  Airway Mallampati: II  TM Distance: >3 FB Neck ROM: Full    Dental  (+) Dental Advisory Given, Teeth Intact   Pulmonary  breath sounds clear to auscultation        Cardiovascular Rhythm:Regular Rate:Normal     Neuro/Psych    GI/Hepatic   Endo/Other  Morbid obesity  Renal/GU      Musculoskeletal   Abdominal   Peds  Hematology   Anesthesia Other Findings   Reproductive/Obstetrics                           Anesthesia Physical Anesthesia Plan  ASA: II  Anesthesia Plan: General   Post-op Pain Management:    Induction: Intravenous  Airway Management Planned: Oral ETT  Additional Equipment:   Intra-op Plan:   Post-operative Plan: Extubation in OR  Informed Consent: I have reviewed the patients History and Physical, chart, labs and discussed the procedure including the risks, benefits and alternatives for the proposed anesthesia with the patient or authorized representative who has indicated his/her understanding and acceptance.   Dental advisory given  Plan Discussed with: CRNA, Anesthesiologist and Surgeon  Anesthesia Plan Comments:        Anesthesia Quick Evaluation

## 2014-12-15 NOTE — Anesthesia Procedure Notes (Signed)
Procedure Name: Intubation Date/Time: 12/15/2014 10:52 AM Performed by: Rise PatienceBELL, Guynell Kleiber T Pre-anesthesia Checklist: Patient identified, Emergency Drugs available, Suction available and Patient being monitored Patient Re-evaluated:Patient Re-evaluated prior to inductionOxygen Delivery Method: Circle system utilized Preoxygenation: Pre-oxygenation with 100% oxygen Intubation Type: IV induction Ventilation: Mask ventilation without difficulty and Oral airway inserted - appropriate to patient size Laryngoscope Size: Hyacinth MeekerMiller and 2 Grade View: Grade II Tube type: Oral Tube size: 7.5 mm Number of attempts: 1 Airway Equipment and Method: Stylet and Oral airway Placement Confirmation: positive ETCO2,  ETT inserted through vocal cords under direct vision and breath sounds checked- equal and bilateral Secured at: 23 cm Tube secured with: Tape Dental Injury: Teeth and Oropharynx as per pre-operative assessment

## 2014-12-16 NOTE — Care Management Note (Signed)
Case Management Note  Patient Details  Name: Raford PitcherDerree D Pressey MRN: [id number] Date of Birth: 12-20-1980  Subjective/Objective:       ADMITTED FOR SURGERY             Action/Plan: CM FOLLOWING FOR DCP; AWAITING FOR PT/OT EVALS FOR DISPOSITION NEEDS  Expected Discharge Date:     12/20/2014 POSSIBLY             Expected Discharge Plan:   HOME  In-House Referral:   CM CONSULT   Status of Service:   IN PROGRESS   Additional Comments:  Reola MosherChandler, Taiwana Willison L, RN,BSN,MHA [phone] 12/16/2014, 10:33 AM

## 2014-12-16 NOTE — Evaluation (Signed)
Physical Therapy Evaluation Patient Details Name: Travis Fitzpatrick D Schlender MRN: [id number] DOB: 01-28-1981 Today's Date: 12/16/2014   History of Present Illness  Patient is a 34 yo male s/p THORACIC SEVEN TO THORACIC NINE Laminectomy.  Clinical Impression  Patient demonstrates deficits in functional mobility as indicated below. Will benefit from continued skilled PT to address deficits and maximize function. Will see as indicated and progress as tolerated.     Follow Up Recommendations No PT follow up;Supervision - Intermittent    Equipment Recommendations  None recommended by PT    Recommendations for Other Services       Precautions / Restrictions Precautions Precautions: Back Precaution Comments: reviewed for comfort measures Restrictions Weight Bearing Restrictions: No      Mobility  Bed Mobility Overal bed mobility: Modified Independent             General bed mobility comments: increased time to perform, no physical assist required  Transfers Overall transfer level: Needs assistance Equipment used: Rolling walker (2 wheeled) Transfers: Sit to/from Stand Sit to Stand: Min assist         General transfer comment: VCs for hand placement, assist to power up to standing using RW  Ambulation/Gait Ambulation/Gait assistance: Supervision Ambulation Distance (Feet): 160 Feet Assistive device: Rolling walker (2 wheeled) Gait Pattern/deviations: Step-through pattern;Decreased stride length Gait velocity: decreased Gait velocity interpretation: Below normal speed for age/gender General Gait Details: steady with ambulation despite slow cadence  Stairs Stairs: Yes Stairs assistance: Supervision Stair Management: Two rails;Step to pattern Number of Stairs: 3 General stair comments: slow but steady with stair negotiation  Wheelchair Mobility    Modified Rankin (Stroke Patients Only)       Balance Overall balance assessment: Needs assistance   Sitting  balance-Leahy Scale: Good     Standing balance support: Bilateral upper extremity supported Standing balance-Leahy Scale: Fair                               Pertinent Vitals/Pain Pain Assessment: 0-10 Pain Score: 4  Pain Location: back Pain Descriptors / Indicators: Discomfort Pain Intervention(s): Monitored during session;Repositioned    Home Living Family/patient expects to be discharged to:: Private residence Living Arrangements: Alone Available Help at Discharge: Family;Available 24 hours/day Type of Home: House Home Access: Stairs to enter Entrance Stairs-Rails: Can reach both Entrance Stairs-Number of Steps: 2 Home Layout: One level Home Equipment: Walker - 2 wheels;Cane - single point Additional Comments: tub shower, standard toilets    Prior Function Level of Independence: Independent with assistive device(s)               Hand Dominance   Dominant Hand: Right    Extremity/Trunk Assessment   Upper Extremity Assessment: Overall WFL for tasks assessed           Lower Extremity Assessment: Overall WFL for tasks assessed         Communication   Communication: No difficulties  Cognition Arousal/Alertness: Awake/alert Behavior During Therapy: WFL for tasks assessed/performed Overall Cognitive Status: Within Functional Limits for tasks assessed                      General Comments      Exercises        Assessment/Plan    PT Assessment Patient needs continued PT services  PT Diagnosis Difficulty walking;Acute pain   PT Problem List Decreased strength;Decreased activity tolerance;Decreased balance;Decreased mobility;Pain  PT Treatment Interventions DME  instruction;Gait training;Stair training;Functional mobility training;Therapeutic activities;Therapeutic exercise;Balance training;Patient/family education   PT Goals (Current goals can be found in the Care Plan section) Acute Rehab PT Goals Patient Stated Goal: to go  home PT Goal Formulation: With patient Time For Goal Achievement: 12/30/14 Potential to Achieve Goals: Good    Frequency Min 5X/week   Barriers to discharge        Co-evaluation               End of Session Equipment Utilized During Treatment: Gait belt Activity Tolerance: Patient tolerated treatment well Patient left: in chair;with call bell/phone within reach Nurse Communication: Mobility status         Time: 1124-1140 PT Time Calculation (min) (ACUTE ONLY): 16 min   Charges:   PT Evaluation $Initial PT Evaluation Tier I: 1 Procedure     PT G CodesFabio Asa:        Shariff Lasky J 12/16/2014, 12:12 PM Charlotte Crumbevon Angelica Wix, PT DPT  [phone]

## 2014-12-17 MED ORDER — TIZANIDINE HCL 4 MG PO TABS: 4.0000 mg | ORAL_TABLET | Freq: Four times a day (QID) | ORAL | Status: DC | PRN

## 2014-12-17 NOTE — Progress Notes (Signed)
Patient DC'd home via car with family.  DC instructions and prescription given to patient.  Both fully understood.  Vital signs and assessments were stable.  

## 2014-12-17 NOTE — Discharge Summary (Signed)
  Physician Discharge Summary  Patient ID: Travis Fitzpatrick MRN: [id number] DOB/AGE: 02/05/1981 34 y.o.  Admit date: 12/15/2014 Discharge date: 12/17/2014  Admission Diagnoses:thoracic stenosis  Discharge Diagnoses:  Active Problems:   Stenosis, spinal, thoracic   Discharged Condition: good  Hospital Course: Mr. Lowell Guitarowell was admitted and taken to the operating room for an uncomplicated thoracic laminectomy for cord decompression. His wound is clean, dry, and without signs of infection at discharge. He is voiding, ambulating, and tolerating a regular diet.   Treatments: surgery: thoracic laminectomy T8-10  Discharge Exam: Blood pressure 117/56, pulse 77, temperature 97.7 F (36.5 C), temperature source Oral, resp. rate 16, weight 134.718 kg (297 lb), SpO2 100 %. General appearance: alert, cooperative, appears stated age and no distress Neurologically he is at baseline, spastic gait, moving upper extremities well. Disposition: 01-Home or Self Care thoracic stenosis    Medication List    TAKE these medications        ibuprofen 800 MG tablet  Commonly known as:  ADVIL,MOTRIN  Take 800 mg by mouth every 6 (six) hours as needed for pain.     tiZANidine 4 MG tablet  Commonly known as:  ZANAFLEX  Take 1 tablet (4 mg total) by mouth every 6 (six) hours as needed for muscle spasms.     tiZANidine 2 MG tablet  Commonly known as:  ZANAFLEX  Take 2 mg by mouth every 6 (six) hours as needed (muscle spasms).           Follow-up Information    Follow up with Cornelis Kluver L, MD.   Specialty:  Neurosurgery   Why:  call office to make an appointment   Contact information:   [address] [place] [address] [phone]       Signed: Shadiamond Koska L 12/17/2014, 10:30 AM

## 2014-12-17 NOTE — Discharge Instructions (Signed)
.  kc °

## 2014-12-20 ENCOUNTER — Encounter (HOSPITAL_COMMUNITY): Payer: Self-pay | Admitting: Neurosurgery

## 2015-02-02 ENCOUNTER — Encounter: Payer: Self-pay | Admitting: Podiatry

## 2015-02-02 ENCOUNTER — Ambulatory Visit (INDEPENDENT_AMBULATORY_CARE_PROVIDER_SITE_OTHER): Payer: Medicare Other | Admitting: Podiatry

## 2015-02-02 ENCOUNTER — Ambulatory Visit (INDEPENDENT_AMBULATORY_CARE_PROVIDER_SITE_OTHER): Payer: Medicare Other

## 2015-02-02 VITALS — Ht 69.0 in | Wt 292.0 lb

## 2015-02-02 DIAGNOSIS — S86111A Strain of other muscle(s) and tendon(s) of posterior muscle group at lower leg level, right leg, initial encounter: Secondary | ICD-10-CM | POA: Diagnosis not present

## 2015-02-02 DIAGNOSIS — S93401A Sprain of unspecified ligament of right ankle, initial encounter: Secondary | ICD-10-CM

## 2015-02-02 NOTE — Progress Notes (Signed)
   Subjective:    Patient ID: Travis Fitzpatrick, male    DOB: 1981-01-15, 34 y.o.   MRN: [id number]  HPI have had numerous back surgeries and ever since the second surgery i do have a lot of falls. Monday took another fall and twisted my ankle. It hurts when up on it and walking     Review of Systems     Objective:   Physical Exam: I have reviewed his past medical history medications allergies surgery social history and review of systems area he states that this ankle has been bothering him for the past 2-to2 1/2 years. He states he initially thought it was from his previous back surgeries however he states that he is in chronic pain as he points to the posterior tibial tendon and was referred to me by his neurosurgeon. Pulses are palpable bilateral. Neurologic sensorium is intact but slightly diminished per Semmes-Weinstein monofilament. This is noted bilateral. Deep tendon reflexes are not elicitable. Muscle strength to the bilateral lower extremity is intact to be slightly weaker on the left side than the right. He has very little extensor or flexor strength in his thighs. Orthopedic evaluation and x-rays all joints distal to the ankle for range of motion without crepitus mild pes planus is noted bilateral. He has severe pain on palpation of the posterior tibial tendon as it courses beneath the medial malleolus extending to the level of the navicular tuberosity of the right foot. This area is slightly swollen is no ecchymosis the tendon is palpable but there appears to be fluctuance within the tendon sheath. Cutaneous evaluation of Mr. supple well-hydrated cutis no erythema edema saline as drainage or odor.        Assessment & Plan:  Assessment: Probable tear of the posterior tibial tendon due to the chronicity and the duration as well as the fluctuance.  Plan: At this point an MRI is in order to evaluate for a tear of his right posterior tibial tendon.

## 2015-02-10 ENCOUNTER — Telehealth: Payer: Self-pay | Admitting: *Deleted

## 2015-02-10 ENCOUNTER — Ambulatory Visit
Admission: RE | Admit: 2015-02-10 | Discharge: 2015-02-10 | Disposition: A | Discharge status: Home / Self Care | Payer: Medicare Other | Source: Ambulatory Visit | Attending: Podiatry | Admitting: Podiatry

## 2015-02-10 DIAGNOSIS — X58XXXA Exposure to other specified factors, initial encounter: Secondary | ICD-10-CM | POA: Diagnosis not present

## 2015-02-10 DIAGNOSIS — S86111A Strain of other muscle(s) and tendon(s) of posterior muscle group at lower leg level, right leg, initial encounter: Secondary | ICD-10-CM | POA: Diagnosis present

## 2015-02-10 NOTE — Telephone Encounter (Signed)
-----   Message from Elinor ParkinsonMax T Hyatt, [place] sent at 02/10/2015 12:52 PM EDT ----- [facility] Please inform patient of the delay. Negative MRI at this point.

## 2015-02-10 NOTE — Telephone Encounter (Signed)
Contacted [facility] and notified patient of delay

## 2015-02-10 NOTE — Telephone Encounter (Signed)
Dr. Al CorpusHyatt request the MRI to be sent for overread.

## 2015-03-16 ENCOUNTER — Encounter: Payer: Self-pay | Admitting: Podiatry

## 2015-03-16 ENCOUNTER — Ambulatory Visit (INDEPENDENT_AMBULATORY_CARE_PROVIDER_SITE_OTHER): Payer: Medicare Other | Admitting: Podiatry

## 2015-03-16 VITALS — BP 119/72 | HR 90 | Resp 16

## 2015-03-16 DIAGNOSIS — S86111D Strain of other muscle(s) and tendon(s) of posterior muscle group at lower leg level, right leg, subsequent encounter: Secondary | ICD-10-CM | POA: Diagnosis not present

## 2015-03-16 NOTE — Progress Notes (Signed)
He presents today for follow-up of his MRI report. He has had 4 back surgeries by Dr. Barbaraann Barthel. He still has weakness and pain in his legs with radiating pain along the medial aspect of the right ankle. He also states that he has pain along the right ankle and the lateral aspect. He states that there has been no change since last visit.  Objective: Vital signs are stable he is alert and oriented 3. Pulses are palpable. He has a positive Tinel sign to the right ankle. MRI does not demonstrate any type of mass within the tarsal tunnel however he does have a tear of his peroneal longus tendon of his right ankle measuring proximally 5 cm. Deep tendon reflexes are not elicitable. Muscle strength is weak dorsiflexors and plantar flexors inverters and everters.  Assessment: Tarsal tunnel syndrome right. Rule out radiculopathy or associated back problems. Tear of the peroneal longus tendon per MRI right.  Plan: We're requesting a nerve conduction velocity exam to rule out tarsal tunnel of the right ankle. We are requesting this to be performed at Dr. Sara Chu office. Once we get the results from this we will follow-up with him for surgical intervention including the peroneal tendon repair.

## 2015-05-19 ENCOUNTER — Telehealth: Payer: Self-pay | Admitting: Podiatry

## 2015-05-19 NOTE — Telephone Encounter (Signed)
PATIENT STOPPED BY THE OFFICE SAID HE HAD A NERVE STUDY BACK ON 03/24/15 BY DR. KYLE CABELL'S OFFICE IN [place] AND NEVER HEARD BACK FROM [place] ABOUT A FOLLOW UP APPT. HE SAID HE HAS CALLED 3 TIMES AND NO ONE HAS CALLED HIM BACK ABOUT THIS. PLEASE CALL HIS CELL AT [phone] TO DISCUSS THE FOLLOW UP IF NEEDED. THANKS!

## 2015-05-23 NOTE — Telephone Encounter (Signed)
Yes, and I also did not see an ordered test in chart either. And thank you!

## 2015-05-23 NOTE — Telephone Encounter (Signed)
Travis Fitzpatrick, I've ordered the copy of the nerve test, but I don't see where we were the ones that originally ordered the test ( if you go by the prompts in Chart Review).  If we didn't order, then we would not have received the result to give to the pt, right?  Anyhow they'll be here tomorrow, Lea from Dr. Mikal Plane is to fax.  Joya San

## 2015-05-23 NOTE — Telephone Encounter (Signed)
Valery-can you call pts doctors office and get results for Dr. Al Corpus to review tomorrow. We haven't had anything faxed here. Thanks!

## 2015-11-30 DIAGNOSIS — R52 Pain, unspecified: Secondary | ICD-10-CM

## 2017-04-23 ENCOUNTER — Other Ambulatory Visit: Payer: Self-pay | Admitting: Neurosurgery

## 2017-04-23 DIAGNOSIS — M5104 Intervertebral disc disorders with myelopathy, thoracic region: Secondary | ICD-10-CM

## 2017-04-27 ENCOUNTER — Emergency Department: Payer: Medicare Other

## 2017-04-27 ENCOUNTER — Emergency Department
Admission: EM | Admit: 2017-04-27 | Discharge: 2017-04-27 | Disposition: A | Discharge status: Home / Self Care | Payer: Medicare Other | Attending: Emergency Medicine | Admitting: Emergency Medicine

## 2017-04-27 DIAGNOSIS — Y999 Unspecified external cause status: Secondary | ICD-10-CM | POA: Insufficient documentation

## 2017-04-27 DIAGNOSIS — W19XXXA Unspecified fall, initial encounter: Secondary | ICD-10-CM | POA: Diagnosis not present

## 2017-04-27 DIAGNOSIS — S8991XA Unspecified injury of right lower leg, initial encounter: Secondary | ICD-10-CM | POA: Diagnosis present

## 2017-04-27 DIAGNOSIS — M25461 Effusion, right knee: Secondary | ICD-10-CM | POA: Diagnosis not present

## 2017-04-27 DIAGNOSIS — S838X1A Sprain of other specified parts of right knee, initial encounter: Secondary | ICD-10-CM | POA: Diagnosis not present

## 2017-04-27 DIAGNOSIS — Y939 Activity, unspecified: Secondary | ICD-10-CM | POA: Insufficient documentation

## 2017-04-27 DIAGNOSIS — Y929 Unspecified place or not applicable: Secondary | ICD-10-CM | POA: Insufficient documentation

## 2017-04-27 DIAGNOSIS — M25561 Pain in right knee: Secondary | ICD-10-CM

## 2017-04-27 NOTE — ED Notes (Signed)
See triage note   Presents with right knee pain s/p a fall couple of days ago  States he has gotten min relief with taking ibu 800   But pain returns  Able to bear wt but limps with ambulation

## 2017-04-27 NOTE — ED Provider Notes (Signed)
[facility] Emergency Department Provider Note   ____________________________________________   I have reviewed the triage vital signs and the nursing notes.   HISTORY  Chief Complaint Knee Pain (right, fell )    HPI Travis Fitzpatrick is a 36 y.o. male presents to emergency department with right knee pain that has not improved since falling 2 days ago. Patient reports having a "bad back" and when his back "flares up" he notes radicular symptoms into the lower extremities. Patient noted weakness in the right lower extremity prior to the fall when he landed on his right knee. Patient noted knee joint pain and difficulty bearing weight through the knee. Patient localizes pain along the medial joint line. Patient denies hearing or feeling a "pop" in the knee. Patient states taking (2) 800 mg ibuprofen today over the last 2 days without significant improvement of symptoms. He generally takes this medication for his back pain. Patient denies fever, chills, headache, vision changes, chest pain, chest tightness, shortness of breath, abdominal pain, nausea and vomiting.  Past Medical History:  Diagnosis Date  . Bowel trouble    urgency  . Medical history non-contributory   . Urinary urgency     Patient Active Problem List   Diagnosis Date Noted  . Stenosis, spinal, thoracic 12/15/2014  . 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 BACK SURGERY  2010  . CIRCUMCISION    . LUMBAR LAMINECTOMY/DECOMPRESSION MICRODISCECTOMY  07/20/2011   Procedure: LUMBAR LAMINECTOMY/DECOMPRESSION MICRODISCECTOMY;  Surgeon: Carmela Hurt;  Location: [facility];  Service: Neurosurgery;  Laterality: N/A;  right thoracotomy with thoracic eight-nine discectomy and fusion  . THORACIC DISCECTOMY  07/16/2012   Procedure: THORACIC DISCECTOMY;  Surgeon:  Carmela Hurt, MD;  Location: [facility];  Service: Neurosurgery;  Laterality: Right;  RIGHT Thoracic seven-eight  thoracic diskectomy via thoracotomy by dr Laneta Simmers  . THORACIC DISCECTOMY N/A 12/15/2014   Procedure: THORACIC SEVEN TO THORACIC NINE Laminectomy ;  Surgeon: Coletta Memos, MD;  Location: [facility];  Service: Neurosurgery;  Laterality: N/A;  . THORACOTOMY  07/20/2011   Procedure: THORACOTOMY OPEN FOR SPINE SURGERY;  Surgeon: Algis Downs Karle Plumber, MD;  Location: [facility];  Service: Vascular;  Laterality: N/A;  . THORACOTOMY  07/16/2012   Procedure: THORACOTOMY OPEN FOR SPINE SURGERY;  Surgeon: Alleen Borne, MD;  Location: [facility];  Service: Thoracic;  Laterality: N/A;    Prior to Admission medications   Medication Sig Start Date End Date Taking? Authorizing Provider  ibuprofen (ADVIL,MOTRIN) 800 MG tablet Take 800 mg by mouth every 6 (six) hours as needed for pain.     [provider]  tiZANidine (ZANAFLEX) 2 MG tablet Take 2 mg by mouth every 6 (six) hours as needed (muscle spasms).    [provider]  tiZANidine (ZANAFLEX) 4 MG tablet Take 1 tablet (4 mg total) by mouth every 6 (six) hours as needed for muscle spasms. 12/17/14   Coletta Memos, MD    Allergies Aspirin and Other  Family History  Problem Relation Age of Onset  . Hypertension Father     Social History Social History  Substance Use Topics  . Smoking status: Never Smoker  . Smokeless tobacco: Never Used  . Alcohol use No    Review of Systems Constitutional: Negative for fever/chills Eyes: No visual changes. ENT:  Negative for  sore throat and for difficulty swallowing Cardiovascular: Denies chest pain. Respiratory: Denies cough. Denies shortness of breath. Gastrointestinal: No abdominal pain.  No nausea, vomiting, diarrhea. Genitourinary: Negative for dysuria. Musculoskeletal: Positive for right knee pain and swelling. Skin: Negative for rash. Neurological: Negative for  headaches.  Negative focal weakness or numbness. Negative for loss of consciousness. Able to ambulate. ____________________________________________   PHYSICAL EXAM:  VITAL SIGNS: ED Triage Vitals  Enc Vitals Group     BP 04/27/17 1420 120/66     Pulse Rate 04/27/17 1420 93     Resp 04/27/17 1420 16     Temp 04/27/17 1420 98.8 F (37.1 C)     Temp Source 04/27/17 1420 Oral     SpO2 04/27/17 1420 97 %     Weight 04/27/17 1421 (!) 310 lb (140.6 kg)     Height 04/27/17 1421  (1.753 m)     Head Circumference --      Peak Flow --      Pain Score 04/27/17 1420 8     Pain Loc --      Pain Edu? --      Excl. in GC? --     Constitutional: Alert and oriented. Well appearing and in no acute distress.  Eyes: Conjunctivae are normal. PERRL. EOMI  Head: Normocephalic and atraumatic. ENT:      Ears: Canals clear. TMs intact bilaterally.      Nose: No congestion/rhinnorhea.      Mouth/Throat: Mucous membranes are moist. Neck:Supple. No thyromegaly. No stridor.  Cardiovascular: Normal rate, regular rhythm. Normal S1 and S2.  Good peripheral circulation. Respiratory: Normal respiratory effort without tachypnea or retractions. Lungs CTAB. No wheezes/rales/rhonchi. Hematological/Lymphatic/Immunological: No cervical lymphadenopathy. Cardiovascular: Normal rate, regular rhythm. Normal distal pulses. Gastrointestinal: Bowel sounds 4 quadrants. Soft and nontender to palpation. No guarding or rigidity.  No CVA tenderness. Musculoskeletal: Right knee pain with increased pain on knee flexion and extension without limitation. Visible swelling with joint effusion. Muscle guarding on assessing ligament laxity. Palpable tenderness along medial joint line specifically over the medial meniscus and MCL. Intact strength and sensation of the right lower extremity. Neurologic: Normal speech and language. Skin:  Skin is warm, dry and intact. No rash noted. Psychiatric: Mood and affect are normal. Speech and  behavior are normal. Patient exhibits appropriate insight and judgement.  ____________________________________________   LABS (all labs ordered are listed, but only abnormal results are displayed)  Labs Reviewed - No data to display ____________________________________________  EKG none ____________________________________________  RADIOLOGY DG right knee complete FINDINGS: A 4 mm linear bony structure above the medial tibial spine noted suspicious for an avulsion fracture.  No other fracture, subluxation or dislocation identified.  There is no evidence of joint effusion.  IMPRESSION: 4 mm linear bony structure above the medial tibial spine suspicious for an avulsion fracture. No joint effusion and this is of uncertain chronicity. ____________________________________________   PROCEDURES  Procedure(s) performed: no    Critical Care performed: no ____________________________________________   INITIAL IMPRESSION / ASSESSMENT AND PLAN / ED COURSE  Pertinent labs & imaging results that were available during my care of the patient were reviewed by me and considered in my medical decision making (see chart for details).  Patient presents to emergency department with right knee pain after a fall. History, physical exam findings, and imaging are reassuring symptoms are consistent with right knee sprain and possible avulsion fracture of the medial tibial spine. Patient given knee immobilizer and crutches, WBAT, for joint protection  and unloading the knee joint with mobility activities. Recommended continuing ibuprofen 800 mg every 8 hours as needed. Patient advised to follow up with orthopedics for continued care or return to the emergency department if symptoms return or worsen. Patient informed of clinical course, understand medical decision-making process, and agree with plan.   ____________________________________________   FINAL CLINICAL IMPRESSION(S) / ED  DIAGNOSES  Final diagnoses:  Acute pain of right knee  Effusion of right knee  Sprain of other ligament of right knee, initial encounter       NEW MEDICATIONS STARTED DURING THIS VISIT:  New Prescriptions   No medications on file     Note:  This document was prepared using Dragon voice recognition software and may include unintentional dictation errors.    Clois Comber, PA-C 04/27/17 1638    Arnaldo Natal, MD 04/28/17 [phone]

## 2017-04-27 NOTE — ED Notes (Signed)
PA notified that pt is above weight limit of the crutches.  PA gave the okay given that the pt is partial weight baring

## 2017-04-27 NOTE — ED Triage Notes (Signed)
Pt arrived by POV, complaining of right knee pain from a fall 2 days ago.  Stated that he rested his knee, but the pain "is different then he has felt before" and took ibprofen 800 mg that helped a little bit but the pain comes back.  Pt is able to put weight on knee, but has had trouble walking on it.

## 2017-04-27 NOTE — Discharge Instructions (Signed)
You may continue taking Ibuprofen 800 mg as needed every 8 hours. I recommend doing this for the next 5 days until symptoms improve.   Follow-up with orthopedics for continued care. Contact information for orthopedics is provided in her discharge instructions.  You may use cold therapy, rest and elevation for symptoms management.

## 2017-05-01 ENCOUNTER — Ambulatory Visit (HOSPITAL_COMMUNITY)
Admission: RE | Admit: 2017-05-01 | Discharge: 2017-05-01 | Disposition: A | Discharge status: Home / Self Care | Payer: Medicare Other | Source: Ambulatory Visit | Attending: Neurosurgery | Admitting: Neurosurgery

## 2017-05-01 ENCOUNTER — Encounter (HOSPITAL_COMMUNITY): Payer: Self-pay

## 2017-05-01 ENCOUNTER — Ambulatory Visit (HOSPITAL_COMMUNITY): Payer: Medicare Other

## 2017-05-01 DIAGNOSIS — M4804 Spinal stenosis, thoracic region: Secondary | ICD-10-CM | POA: Insufficient documentation

## 2017-05-01 DIAGNOSIS — M48061 Spinal stenosis, lumbar region without neurogenic claudication: Secondary | ICD-10-CM | POA: Diagnosis not present

## 2017-05-01 DIAGNOSIS — M5104 Intervertebral disc disorders with myelopathy, thoracic region: Secondary | ICD-10-CM | POA: Diagnosis present

## 2017-05-01 DIAGNOSIS — Z981 Arthrodesis status: Secondary | ICD-10-CM | POA: Diagnosis not present

## 2017-05-01 DIAGNOSIS — M4807 Spinal stenosis, lumbosacral region: Secondary | ICD-10-CM | POA: Insufficient documentation

## 2017-05-01 MED ORDER — GADOBENATE DIMEGLUMINE 529 MG/ML IV SOLN
20.0000 mL | Freq: Once | INTRAVENOUS | Status: AC | PRN
  Administered 2017-05-01: 20 mL via INTRAVENOUS

## 2017-05-10 ENCOUNTER — Other Ambulatory Visit: Payer: Self-pay | Admitting: Orthopedic Surgery

## 2017-05-10 DIAGNOSIS — M25561 Pain in right knee: Secondary | ICD-10-CM

## 2017-05-15 ENCOUNTER — Ambulatory Visit
Admission: RE | Admit: 2017-05-15 | Discharge: 2017-05-15 | Disposition: A | Discharge status: Home / Self Care | Payer: Medicare Other | Source: Ambulatory Visit | Attending: Orthopedic Surgery | Admitting: Orthopedic Surgery

## 2017-05-15 DIAGNOSIS — M25561 Pain in right knee: Secondary | ICD-10-CM | POA: Insufficient documentation

## 2017-05-15 DIAGNOSIS — R937 Abnormal findings on diagnostic imaging of other parts of musculoskeletal system: Secondary | ICD-10-CM | POA: Diagnosis not present

## 2017-05-15 DIAGNOSIS — M25461 Effusion, right knee: Secondary | ICD-10-CM | POA: Insufficient documentation

## 2017-05-28 ENCOUNTER — Other Ambulatory Visit: Payer: Self-pay | Admitting: Neurosurgery

## 2017-06-03 NOTE — Pre-Procedure Instructions (Signed)
Travis Fitzpatrick  06/03/2017      [id number] [address] [place] - [address] [place] [id number] Phone: [phone] Fax: [phone]    Your procedure is scheduled on Thursday October 25.  [facility] at 12:50 A.M.  Call this number if you have problems the morning of surgery:  [phone]   Remember:  Do not eat food or drink liquids after midnight.  Take these medicines the morning of surgery with A SIP OF WATER: Gabapentin (neurontin)  7 days prior to surgery STOP taking any Aspirin (unless otherwise instructed by your surgeon), Aleve, Naproxen, Ibuprofen, Motrin, Advil, Goody's, BC's, all herbal medications, fish oil, and all vitamins    Do not wear jewelry, make-up or nail polish.  Do not wear lotions, powders, or perfumes, or deoderant.  Do not shave 48 hours prior to surgery.  Men may shave face and neck.  Do not bring valuables to the hospital.  [facility] is not responsible for any belongings or valuables.  Contacts, dentures or bridgework may not be worn into surgery.  Leave your suitcase in the car.  After surgery it may be brought to your room.  For patients admitted to the hospital, discharge time will be determined by your treatment team.  Patients discharged the day of surgery will not be allowed to drive home.    Special instructions:    [place]- Preparing For Surgery  Before surgery, you can play an important role. Because skin is not sterile, your skin needs to be as free of germs as possible. You can reduce the number of germs on your skin by washing with CHG (chlorahexidine gluconate) Soap before surgery.  CHG is an antiseptic cleaner which kills germs and bonds with the skin to continue killing germs even after washing.  Please do not use if you have an allergy to CHG or antibacterial soaps. If your skin becomes reddened/irritated stop using the CHG.   Do not shave (including legs and underarms) for at least 48 hours prior to first CHG shower. It is OK to shave your face.  Please follow these instructions carefully.   1. Shower the NIGHT BEFORE SURGERY and the MORNING OF SURGERY with CHG.   2. If you chose to wash your hair, wash your hair first as usual with your normal shampoo.  3. After you shampoo, rinse your hair and body thoroughly to remove the shampoo.  4. Use CHG as you would any other liquid soap. You can apply CHG directly to the skin and wash gently with a scrungie or a clean washcloth.   5. Apply the CHG Soap to your body ONLY FROM THE NECK DOWN.  Do not use on open wounds or open sores. Avoid contact with your eyes, ears, mouth and genitals (private parts). Wash Face and genitals (private parts)  with your normal soap.  6. Wash thoroughly, paying special attention to the area where your surgery will be performed.  7. Thoroughly rinse your body with warm water from the neck down.  8. DO NOT shower/wash with your normal soap after using and rinsing off the CHG Soap.  9. Pat yourself dry with a CLEAN TOWEL.  10. Wear CLEAN PAJAMAS to bed the night before surgery, wear comfortable clothes the morning of surgery  11. Place CLEAN SHEETS on your bed the night of your first shower and DO NOT SLEEP WITH PETS.  Day of Surgery: Do not apply any deodorants/lotions. Please wear clean clothes to the hospital/surgery center.      Please read over the following fact sheets that you were given. Coughing and Deep Breathing

## 2017-06-04 ENCOUNTER — Encounter (HOSPITAL_COMMUNITY)
Admission: RE | Admit: 2017-06-04 | Discharge: 2017-06-04 | Disposition: A | Discharge status: Home / Self Care | Payer: Medicare Other | Source: Ambulatory Visit | Attending: Neurosurgery | Admitting: Neurosurgery

## 2017-06-04 ENCOUNTER — Encounter (HOSPITAL_COMMUNITY): Payer: Self-pay

## 2017-06-04 DIAGNOSIS — Z79899 Other long term (current) drug therapy: Secondary | ICD-10-CM | POA: Diagnosis not present

## 2017-06-04 DIAGNOSIS — Z888 Allergy status to other drugs, medicaments and biological substances status: Secondary | ICD-10-CM | POA: Diagnosis not present

## 2017-06-04 DIAGNOSIS — M4804 Spinal stenosis, thoracic region: Secondary | ICD-10-CM | POA: Diagnosis not present

## 2017-06-04 DIAGNOSIS — Z791 Long term (current) use of non-steroidal anti-inflammatories (NSAID): Secondary | ICD-10-CM | POA: Diagnosis not present

## 2017-06-04 LAB — BASIC METABOLIC PANEL
Anion gap: 7 (ref 5–15)
BUN: 11 mg/dL (ref 6–20)
CALCIUM: 9 mg/dL (ref 8.9–10.3)
CO2: 27 mmol/L (ref 22–32)
CREATININE: 1.05 mg/dL (ref 0.61–1.24)
Chloride: 104 mmol/L (ref 101–111)
GFR calc non Af Amer: >60 mL/min (ref >60)
GLUCOSE: 95 mg/dL (ref 65–99)
Potassium: 3.8 mmol/L (ref 3.5–5.1)
Sodium: 138 mmol/L (ref 135–145)

## 2017-06-04 LAB — CBC
HEMATOCRIT: 38.1 % — AB (ref 39.0–52.0)
Hemoglobin: 12.3 g/dL — ABNORMAL LOW (ref 13.0–17.0)
MCH: 31.1 pg (ref 26.0–34.0)
MCHC: 32.3 g/dL (ref 30.0–36.0)
MCV: 96.5 fL (ref 78.0–100.0)
Platelets: 196 10*3/uL (ref 150–400)
RBC: 3.95 MIL/uL — ABNORMAL LOW (ref 4.22–5.81)
RDW: 13.3 % (ref 11.5–15.5)
WBC: 5.1 10*3/uL (ref 4.0–10.5)

## 2017-06-04 LAB — SURGICAL PCR SCREEN
MRSA, PCR: NEGATIVE
Staphylococcus aureus: POSITIVE — AB

## 2017-06-04 MED ORDER — CHLORHEXIDINE GLUCONATE CLOTH 2 % EX PADS: 6.0000 | MEDICATED_PAD | Freq: Once | CUTANEOUS | Status: DC

## 2017-06-04 NOTE — Progress Notes (Signed)
PCP - L. Lupe Carneyean Mitchell Pt denies cardiac history or cardiac workup.   Patient denies shortness of breath, fever, cough and chest pain at PAT appointment.  Patient verbalized understanding of instructions that were given to them at the PAT appointment. Patient was also instructed that they will need to review over the PAT instructions again at home before surgery.

## 2017-06-05 MED ORDER — CEFAZOLIN SODIUM 10 G IJ SOLR
3.0000 g | INTRAMUSCULAR | Status: AC
  Administered 2017-06-06: 3 g via INTRAVENOUS
  Filled 2017-06-05: qty 3000

## 2017-06-06 ENCOUNTER — Ambulatory Visit (HOSPITAL_COMMUNITY): Payer: Medicare Other | Admitting: Certified Registered Nurse Anesthetist

## 2017-06-06 ENCOUNTER — Encounter (HOSPITAL_COMMUNITY): Payer: Self-pay | Admitting: *Deleted

## 2017-06-06 ENCOUNTER — Ambulatory Visit (HOSPITAL_COMMUNITY): Payer: Medicare Other

## 2017-06-06 ENCOUNTER — Encounter (HOSPITAL_COMMUNITY)
Admission: RE | Disposition: A | Discharge status: Home / Self Care | Payer: Self-pay | Source: Ambulatory Visit | Attending: Neurosurgery

## 2017-06-06 ENCOUNTER — Ambulatory Visit (HOSPITAL_COMMUNITY)
Admission: RE | Admit: 2017-06-06 | Discharge: 2017-06-08 | Disposition: A | Discharge status: Home / Self Care | Payer: Medicare Other | Source: Ambulatory Visit | Attending: Neurosurgery | Admitting: Neurosurgery

## 2017-06-06 DIAGNOSIS — Z888 Allergy status to other drugs, medicaments and biological substances status: Secondary | ICD-10-CM | POA: Diagnosis not present

## 2017-06-06 DIAGNOSIS — Z419 Encounter for procedure for purposes other than remedying health state, unspecified: Secondary | ICD-10-CM

## 2017-06-06 DIAGNOSIS — Z79899 Other long term (current) drug therapy: Secondary | ICD-10-CM | POA: Diagnosis not present

## 2017-06-06 DIAGNOSIS — Z791 Long term (current) use of non-steroidal anti-inflammatories (NSAID): Secondary | ICD-10-CM | POA: Diagnosis not present

## 2017-06-06 DIAGNOSIS — M4804 Spinal stenosis, thoracic region: Secondary | ICD-10-CM | POA: Diagnosis present

## 2017-06-06 HISTORY — PX: THORACIC DISCECTOMY: SHX6113

## 2017-06-06 SURGERY — THORACIC DISCECTOMY
Anesthesia: General

## 2017-06-06 MED ORDER — GABAPENTIN 300 MG PO CAPS
300.0000 mg | ORAL_CAPSULE | Freq: Three times a day (TID) | ORAL | Status: DC
  Administered 2017-06-07 – 2017-06-08 (×4): 300 mg via ORAL
  Filled 2017-06-06 (×4): qty 1

## 2017-06-06 MED ORDER — ROCURONIUM BROMIDE 10 MG/ML (PF) SYRINGE
PREFILLED_SYRINGE | INTRAVENOUS | Status: DC | PRN
  Administered 2017-06-06: 10 mg via INTRAVENOUS
  Administered 2017-06-06: 40 mg via INTRAVENOUS
  Administered 2017-06-06: 10 mg via INTRAVENOUS

## 2017-06-06 MED ORDER — HYDROMORPHONE HCL 1 MG/ML IJ SOLN: 0.2500 mg | INTRAMUSCULAR | Status: DC | PRN

## 2017-06-06 MED ORDER — ACETAMINOPHEN 650 MG RE SUPP: 650.0000 mg | RECTAL | Status: DC | PRN

## 2017-06-06 MED ORDER — SODIUM CHLORIDE 0.9 % IV SOLN: 250.0000 mL | INTRAVENOUS | Status: DC

## 2017-06-06 MED ORDER — ALUM & MAG HYDROXIDE-SIMETH 200-200-20 MG/5ML PO SUSP
30.0000 mL | Freq: Four times a day (QID) | ORAL | Status: DC | PRN
Start: 2017-06-06 — End: 2017-06-08

## 2017-06-06 MED ORDER — DOCUSATE SODIUM 100 MG PO CAPS
100.0000 mg | ORAL_CAPSULE | Freq: Two times a day (BID) | ORAL | Status: DC
  Administered 2017-06-06 – 2017-06-08 (×4): 100 mg via ORAL
  Filled 2017-06-06 (×4): qty 1

## 2017-06-06 MED ORDER — DIAZEPAM 5 MG PO TABS: 5.0000 mg | ORAL_TABLET | Freq: Four times a day (QID) | ORAL | Status: DC | PRN

## 2017-06-06 MED ORDER — HYDROMORPHONE HCL 1 MG/ML IJ SOLN
INTRAMUSCULAR | Status: AC
  Filled 2017-06-06: qty 1

## 2017-06-06 MED ORDER — ZOLPIDEM TARTRATE 5 MG PO TABS: 5.0000 mg | ORAL_TABLET | Freq: Every evening | ORAL | Status: DC | PRN

## 2017-06-06 MED ORDER — THROMBIN (RECOMBINANT) 5000 UNITS EX SOLR
CUTANEOUS | Status: DC | PRN
  Administered 2017-06-06: 5000 [IU] via TOPICAL

## 2017-06-06 MED ORDER — MORPHINE SULFATE (PF) 2 MG/ML IV SOLN: 2.0000 mg | INTRAVENOUS | Status: DC | PRN

## 2017-06-06 MED ORDER — OXYCODONE HCL 5 MG PO TABS
10.0000 mg | ORAL_TABLET | ORAL | Status: DC | PRN
  Administered 2017-06-07 – 2017-06-08 (×3): 10 mg via ORAL
  Filled 2017-06-06 (×3): qty 2

## 2017-06-06 MED ORDER — OXYCODONE HCL 5 MG PO TABS
5.0000 mg | ORAL_TABLET | ORAL | Status: DC | PRN
  Administered 2017-06-07: 5 mg via ORAL
  Filled 2017-06-06: qty 1

## 2017-06-06 MED ORDER — ONDANSETRON HCL 4 MG/2ML IJ SOLN: 4.0000 mg | Freq: Four times a day (QID) | INTRAMUSCULAR | Status: DC | PRN

## 2017-06-06 MED ORDER — LIDOCAINE-EPINEPHRINE 0.5 %-1:200000 IJ SOLN
INTRAMUSCULAR | Status: DC | PRN
  Administered 2017-06-06: 1 mL via INTRADERMAL

## 2017-06-06 MED ORDER — SODIUM CHLORIDE 0.9% FLUSH
3.0000 mL | Freq: Two times a day (BID) | INTRAVENOUS | Status: DC
  Administered 2017-06-06 – 2017-06-07 (×3): 3 mL via INTRAVENOUS

## 2017-06-06 MED ORDER — MIDAZOLAM HCL 5 MG/5ML IJ SOLN
INTRAMUSCULAR | Status: DC | PRN
  Administered 2017-06-06: 2 mg via INTRAVENOUS

## 2017-06-06 MED ORDER — CELECOXIB 200 MG PO CAPS
200.0000 mg | ORAL_CAPSULE | Freq: Two times a day (BID) | ORAL | Status: DC
  Administered 2017-06-06 – 2017-06-08 (×4): 200 mg via ORAL
  Filled 2017-06-06 (×4): qty 1

## 2017-06-06 MED ORDER — THROMBIN (RECOMBINANT) 5000 UNITS EX SOLR
CUTANEOUS | Status: AC
  Filled 2017-06-06: qty 5000

## 2017-06-06 MED ORDER — DEXAMETHASONE SODIUM PHOSPHATE 10 MG/ML IJ SOLN
INTRAMUSCULAR | Status: DC | PRN
Start: 2017-06-06 — End: 2017-06-06
  Administered 2017-06-06: 10 mg via INTRAVENOUS

## 2017-06-06 MED ORDER — SUGAMMADEX SODIUM 200 MG/2ML IV SOLN
INTRAVENOUS | Status: DC | PRN
  Administered 2017-06-06: 300 mg via INTRAVENOUS

## 2017-06-06 MED ORDER — PHENYLEPHRINE 40 MCG/ML (10ML) SYRINGE FOR IV PUSH (FOR BLOOD PRESSURE SUPPORT)
PREFILLED_SYRINGE | INTRAVENOUS | Status: DC | PRN
  Administered 2017-06-06 (×3): 40 ug via INTRAVENOUS

## 2017-06-06 MED ORDER — SENNOSIDES-DOCUSATE SODIUM 8.6-50 MG PO TABS
1.0000 | ORAL_TABLET | Freq: Every evening | ORAL | Status: DC | PRN
  Administered 2017-06-08: 1 via ORAL
  Filled 2017-06-06: qty 1

## 2017-06-06 MED ORDER — 0.9 % SODIUM CHLORIDE (POUR BTL) OPTIME
TOPICAL | Status: DC | PRN
  Administered 2017-06-06: 1000 mL

## 2017-06-06 MED ORDER — ONDANSETRON HCL 4 MG/2ML IJ SOLN
INTRAMUSCULAR | Status: DC | PRN
  Administered 2017-06-06: 4 mg via INTRAVENOUS

## 2017-06-06 MED ORDER — BISACODYL 5 MG PO TBEC: 5.0000 mg | DELAYED_RELEASE_TABLET | Freq: Every day | ORAL | Status: DC | PRN

## 2017-06-06 MED ORDER — PROPOFOL 10 MG/ML IV BOLUS
INTRAVENOUS | Status: DC | PRN
  Administered 2017-06-06: 150 mg via INTRAVENOUS

## 2017-06-06 MED ORDER — FENTANYL CITRATE (PF) 100 MCG/2ML IJ SOLN
INTRAMUSCULAR | Status: DC | PRN
  Administered 2017-06-06: 150 ug via INTRAVENOUS
  Administered 2017-06-06: 50 ug via INTRAVENOUS

## 2017-06-06 MED ORDER — ONDANSETRON HCL 4 MG PO TABS: 4.0000 mg | ORAL_TABLET | Freq: Four times a day (QID) | ORAL | Status: DC | PRN

## 2017-06-06 MED ORDER — MENTHOL 3 MG MT LOZG: 1.0000 | LOZENGE | OROMUCOSAL | Status: DC | PRN

## 2017-06-06 MED ORDER — SUCCINYLCHOLINE CHLORIDE 200 MG/10ML IV SOSY
PREFILLED_SYRINGE | INTRAVENOUS | Status: DC | PRN
  Administered 2017-06-06: 140 mg via INTRAVENOUS

## 2017-06-06 MED ORDER — MIDAZOLAM HCL 2 MG/2ML IJ SOLN
INTRAMUSCULAR | Status: AC
  Filled 2017-06-06: qty 2

## 2017-06-06 MED ORDER — SODIUM CHLORIDE 0.9% FLUSH: 3.0000 mL | INTRAVENOUS | Status: DC | PRN

## 2017-06-06 MED ORDER — PHENOL 1.4 % MT LIQD: 1.0000 | OROMUCOSAL | Status: DC | PRN

## 2017-06-06 MED ORDER — THROMBIN (RECOMBINANT) 5000 UNITS EX SOLR
OROMUCOSAL | Status: DC | PRN
  Administered 2017-06-06: 17:00:00 via TOPICAL

## 2017-06-06 MED ORDER — LIDOCAINE 2% (20 MG/ML) 5 ML SYRINGE
INTRAMUSCULAR | Status: DC | PRN
  Administered 2017-06-06: 60 mg via INTRAVENOUS

## 2017-06-06 MED ORDER — ACETAMINOPHEN 325 MG PO TABS: 650.0000 mg | ORAL_TABLET | ORAL | Status: DC | PRN

## 2017-06-06 MED ORDER — FENTANYL CITRATE (PF) 250 MCG/5ML IJ SOLN
INTRAMUSCULAR | Status: AC
  Filled 2017-06-06: qty 5

## 2017-06-06 MED ORDER — HYDROMORPHONE HCL 1 MG/ML IJ SOLN
0.2500 mg | INTRAMUSCULAR | Status: DC | PRN
  Administered 2017-06-06 (×2): 0.5 mg via INTRAVENOUS

## 2017-06-06 MED ORDER — ARTIFICIAL TEARS OPHTHALMIC OINT
TOPICAL_OINTMENT | OPHTHALMIC | Status: DC | PRN
  Administered 2017-06-06: 1 via OPHTHALMIC

## 2017-06-06 MED ORDER — MAGNESIUM CITRATE PO SOLN: 1.0000 | Freq: Once | ORAL | Status: DC | PRN

## 2017-06-06 MED ORDER — POTASSIUM CHLORIDE IN NACL 20-0.9 MEQ/L-% IV SOLN
INTRAVENOUS | Status: DC
  Administered 2017-06-06: 1000 mL via INTRAVENOUS
  Filled 2017-06-06: qty 1000

## 2017-06-06 MED ORDER — LIDOCAINE-EPINEPHRINE 0.5 %-1:200000 IJ SOLN
INTRAMUSCULAR | Status: AC
  Filled 2017-06-06: qty 1

## 2017-06-06 MED ORDER — OXYCODONE HCL ER 10 MG PO T12A
10.0000 mg | EXTENDED_RELEASE_TABLET | Freq: Two times a day (BID) | ORAL | Status: DC
  Administered 2017-06-06 – 2017-06-08 (×4): 10 mg via ORAL
  Filled 2017-06-06 (×4): qty 1

## 2017-06-06 MED ORDER — LACTATED RINGERS IV SOLN
INTRAVENOUS | Status: DC
  Administered 2017-06-06 (×3): via INTRAVENOUS

## 2017-06-06 SURGICAL SUPPLY — 58 items
BAG DECANTER FOR FLEXI CONT (MISCELLANEOUS) IMPLANT
BENZOIN TINCTURE PRP APPL 2/3 (GAUZE/BANDAGES/DRESSINGS) ×3 IMPLANT
BLADE CLIPPER SURG (BLADE) IMPLANT
BUR ACORN 6.0 (BURR) ×2 IMPLANT
BUR ACORN 6.0MM (BURR) ×1
BUR MATCHSTICK NEURO 3.0 LAGG (BURR) ×3 IMPLANT
BUR PRECISION FLUTE 5.0 (BURR) ×3 IMPLANT
CANISTER SUCT 3000ML PPV (MISCELLANEOUS) ×3 IMPLANT
CARTRIDGE OIL MAESTRO DRILL (MISCELLANEOUS) ×1 IMPLANT
DECANTER SPIKE VIAL GLASS SM (MISCELLANEOUS) ×3 IMPLANT
DERMABOND ADVANCED (GAUZE/BANDAGES/DRESSINGS) ×2
DERMABOND ADVANCED .7 DNX12 (GAUZE/BANDAGES/DRESSINGS) ×1 IMPLANT
DIFFUSER DRILL AIR PNEUMATIC (MISCELLANEOUS) ×3 IMPLANT
DRAPE LAPAROTOMY 100X72X124 (DRAPES) ×3 IMPLANT
DRAPE MICROSCOPE LEICA (MISCELLANEOUS) IMPLANT
DRAPE POUCH INSTRU U-SHP 10X18 (DRAPES) ×3 IMPLANT
DRAPE SURG 17X23 STRL (DRAPES) ×12 IMPLANT
ELECT BLADE 4.0 EZ CLEAN MEGAD (MISCELLANEOUS) ×3
ELECT CAUTERY BLADE 6.4 (BLADE) ×3 IMPLANT
ELECT REM PT RETURN 9FT ADLT (ELECTROSURGICAL) ×3
ELECTRODE BLDE 4.0 EZ CLN MEGD (MISCELLANEOUS) ×1 IMPLANT
ELECTRODE REM PT RTRN 9FT ADLT (ELECTROSURGICAL) ×1 IMPLANT
GAUZE SPONGE 4X4 16PLY XRAY LF (GAUZE/BANDAGES/DRESSINGS) IMPLANT
GLOVE BIO SURGEON STRL SZ8 (GLOVE) ×3 IMPLANT
GLOVE BIOGEL PI IND STRL 7.0 (GLOVE) ×1 IMPLANT
GLOVE BIOGEL PI IND STRL 8 (GLOVE) ×1 IMPLANT
GLOVE BIOGEL PI INDICATOR 7.0 (GLOVE) ×2
GLOVE BIOGEL PI INDICATOR 8 (GLOVE) ×2
GLOVE ECLIPSE 6.5 STRL STRAW (GLOVE) ×3 IMPLANT
GLOVE ECLIPSE 7.5 STRL STRAW (GLOVE) ×6 IMPLANT
GLOVE EXAM NITRILE LRG STRL (GLOVE) IMPLANT
GLOVE EXAM NITRILE XL STR (GLOVE) IMPLANT
GLOVE EXAM NITRILE XS STR PU (GLOVE) IMPLANT
GLOVE INDICATOR 8.5 STRL (GLOVE) ×3 IMPLANT
GLOVE SURG SS PI 6.5 STRL IVOR (GLOVE) ×3 IMPLANT
GOWN STRL REUS W/ TWL LRG LVL3 (GOWN DISPOSABLE) ×2 IMPLANT
GOWN STRL REUS W/ TWL XL LVL3 (GOWN DISPOSABLE) ×2 IMPLANT
GOWN STRL REUS W/TWL 2XL LVL3 (GOWN DISPOSABLE) IMPLANT
GOWN STRL REUS W/TWL LRG LVL3 (GOWN DISPOSABLE) ×4
GOWN STRL REUS W/TWL XL LVL3 (GOWN DISPOSABLE) ×4
KIT BASIN OR (CUSTOM PROCEDURE TRAY) ×3 IMPLANT
KIT ROOM TURNOVER OR (KITS) ×3 IMPLANT
NEEDLE HYPO 21X1.5 SAFETY (NEEDLE) IMPLANT
NEEDLE HYPO 22GX1.5 SAFETY (NEEDLE) ×3 IMPLANT
NEEDLE SPNL 18GX3.5 QUINCKE PK (NEEDLE) ×3 IMPLANT
NS IRRIG 1000ML POUR BTL (IV SOLUTION) ×3 IMPLANT
OIL CARTRIDGE MAESTRO DRILL (MISCELLANEOUS) ×3
PACK LAMINECTOMY NEURO (CUSTOM PROCEDURE TRAY) ×3 IMPLANT
PAD ARMBOARD 7.5X6 YLW CONV (MISCELLANEOUS) ×9 IMPLANT
PATTIES SURGICAL .5 X1 (DISPOSABLE) IMPLANT
RUBBERBAND STERILE (MISCELLANEOUS) ×6 IMPLANT
SPONGE SURGIFOAM ABS GEL SZ50 (HEMOSTASIS) ×3 IMPLANT
SUT VIC AB 1 CT1 18XBRD ANBCTR (SUTURE) ×1 IMPLANT
SUT VIC AB 1 CT1 8-18 (SUTURE) ×2
SUT VIC AB 3-0 SH 8-18 (SUTURE) ×6 IMPLANT
TOWEL GREEN STERILE (TOWEL DISPOSABLE) ×3 IMPLANT
TOWEL GREEN STERILE FF (TOWEL DISPOSABLE) ×3 IMPLANT
WATER STERILE IRR 1000ML POUR (IV SOLUTION) ×3 IMPLANT

## 2017-06-06 NOTE — Anesthesia Procedure Notes (Signed)
Procedure Name: Intubation Date/Time: 06/06/2017 3:45 PM Performed by: Rise PatienceBELL, Unique Sillas T Pre-anesthesia Checklist: Patient identified, Emergency Drugs available, Suction available and Patient being monitored Patient Re-evaluated:Patient Re-evaluated prior to induction Oxygen Delivery Method: Circle System Utilized Preoxygenation: Pre-oxygenation with 100% oxygen Induction Type: IV induction and Rapid sequence Laryngoscope Size: Miller and 2 Grade View: Grade II Tube type: Oral Tube size: 7.5 mm Number of attempts: 1 Airway Equipment and Method: Stylet and Oral airway Placement Confirmation: ETT inserted through vocal cords under direct vision,  positive ETCO2 and breath sounds checked- equal and bilateral Secured at: 23 cm Tube secured with: Tape Dental Injury: Teeth and Oropharynx as per pre-operative assessment

## 2017-06-06 NOTE — Transfer of Care (Signed)
Immediate Anesthesia Transfer of Care Note  Patient: Travis Fitzpatrick  Procedure(s) Performed: LAMINECTOMY THORACIC NINE-TEN (N/A )  Patient Location: PACU  Anesthesia Type:General  Level of Consciousness: awake and oriented cooperative   Airway & Oxygen Therapy: Patient Spontanous Breathing  Post-op Assessment: Report given to RN and Post -op Vital signs reviewed and stable  Post vital signs: Reviewed and stable  Last Vitals:  Vitals:   06/06/17 1136  BP: 131/72  Pulse: 75  Resp: 19  Temp: 36.7 C  SpO2: 100%    Last Pain:  Vitals:   06/06/17 1201  TempSrc:   PainSc: 10-Worst pain ever      Patients Stated Pain Goal: 0 (06/06/17 1201)  Complications: No apparent anesthesia complications

## 2017-06-06 NOTE — Anesthesia Postprocedure Evaluation (Signed)
Anesthesia Post Note  Patient: Travis Fitzpatrick  Procedure(s) Performed: LAMINECTOMY THORACIC NINE-TEN (N/A )     Patient location during evaluation: PACU Anesthesia Type: General Level of consciousness: awake Pain management: pain level controlled Vital Signs Assessment: post-procedure vital signs reviewed and stable Respiratory status: spontaneous breathing Cardiovascular status: stable Anesthetic complications: no    Last Vitals:  Vitals:   06/06/17 1136 06/06/17 1803  BP: 131/72 128/70  Pulse: 75   Resp: 19   Temp: 36.7 C 36.4 C  SpO2: 100%     Last Pain:  Vitals:   06/06/17 1201  TempSrc:   PainSc: 10-Worst pain ever                 Gwenlyn Hottinger

## 2017-06-06 NOTE — Anesthesia Preprocedure Evaluation (Addendum)
Anesthesia Evaluation  Patient identified by MRN, date of birth, ID band Patient awake    Reviewed: Allergy & Precautions, NPO status , Patient's Chart, lab work & pertinent test results  Airway Mallampati: II  TM Distance: >3 FB     Dental   Pulmonary neg pulmonary ROS,    breath sounds clear to auscultation       Cardiovascular negative cardio ROS   Rhythm:Regular Rate:Normal     Neuro/Psych    GI/Hepatic negative GI ROS, Neg liver ROS,   Endo/Other    Renal/GU negative Renal ROS     Musculoskeletal   Abdominal   Peds  Hematology   Anesthesia Other Findings   Reproductive/Obstetrics                            Anesthesia Physical Anesthesia Plan  ASA: II  Anesthesia Plan: General   Post-op Pain Management:    Induction: Intravenous  PONV Risk Score and Plan: 1 and Ondansetron, Dexamethasone, Treatment may vary due to age or medical condition and Midazolam  Airway Management Planned: Oral ETT  Additional Equipment:   Intra-op Plan:   Post-operative Plan: Extubation in OR  Informed Consent: I have reviewed the patients History and Physical, chart, labs and discussed the procedure including the risks, benefits and alternatives for the proposed anesthesia with the patient or authorized representative who has indicated his/her understanding and acceptance.   Dental advisory given  Plan Discussed with: CRNA and Anesthesiologist  Anesthesia Plan Comments:       Anesthesia Quick Evaluation

## 2017-06-06 NOTE — H&P (Signed)
BP 131/72   Pulse 75   Temp 98.1 F (36.7 C) (Oral)   Resp 19   Ht 5\' 9"  (1.753 m)   Wt (!) 139.3 kg (307 lb)   SpO2 100%   BMI 45.34 kg/m     [name] is a patient of mine who has undergone a thoracic discectomy. He states today that he is having a sharp pain in the middle of his back since about January 18, throbbing pain going up and down the left leg. He says at times he is unable to put pressure on his left leg. His back will go out and feel like it is pinching in his lower back. At times, he can't move his lower back because of pain and he can't feel it, in his words. He also has pain and a pinching feeling at times, gets worse when he has to poop, and he has a full sensation in his lower back and pelvis.     On exam today, he continues to be hyporeflexic. He has a spastic gait. Strength is full in the upper and lower extremities. Speech is normal.    Allergies  Allergen Reactions  . Aspirin Rash  . Other Itching and Other (See Comments)    Apples,oranges, grapes (skin on outside)-  throat itches   Past Medical History:  Diagnosis Date  . Bowel trouble    urgency  . Medical history non-contributory   . Urinary urgency    Prior to Admission medications   Medication Sig Start Date End Date Taking? Authorizing Provider  gabapentin (NEURONTIN) 300 MG capsule Take 300 mg by mouth 3 (three) times daily.   Yes [provider]  ibuprofen (ADVIL,MOTRIN) 800 MG tablet Take 800 mg by mouth every 6 (six) hours as needed for pain.    Yes [provider]  tiZANidine (ZANAFLEX) 4 MG tablet Take 1 tablet (4 mg total) by mouth every 6 (six) hours as needed for muscle spasms. Patient not taking: Reported on 05/29/2017 12/17/14   Coletta Memosabbell, Kattia Selley, MD   Past Surgical History:  Procedure Laterality Date  . BACK SURGERY  2010  . CIRCUMCISION    . LUMBAR LAMINECTOMY/DECOMPRESSION MICRODISCECTOMY  07/20/2011   Procedure: LUMBAR LAMINECTOMY/DECOMPRESSION MICRODISCECTOMY;   Surgeon: Carmela HurtKyle L Tonette Koehne;  Location: [facility];  Service: Neurosurgery;  Laterality: N/A;  right thoracotomy with thoracic eight-nine discectomy and fusion  . THORACIC DISCECTOMY  07/16/2012   Procedure: THORACIC DISCECTOMY;  Surgeon: Carmela HurtKyle L Gaytha Raybourn, MD;  Location: [facility];  Service: Neurosurgery;  Laterality: Right;  RIGHT Thoracic seven-eight  thoracic diskectomy via thoracotomy by dr Laneta Simmersbartle  . THORACIC DISCECTOMY N/A 12/15/2014   Procedure: THORACIC SEVEN TO THORACIC NINE Laminectomy ;  Surgeon: Coletta MemosKyle Doyl Bitting, MD;  Location: [facility];  Service: Neurosurgery;  Laterality: N/A;  . THORACOTOMY  07/20/2011   Procedure: THORACOTOMY OPEN FOR SPINE SURGERY;  Surgeon: Algis Downs Karle PlumberPatrick Burney, MD;  Location: [facility];  Service: Vascular;  Laterality: N/A;  . THORACOTOMY  07/16/2012   Procedure: THORACOTOMY OPEN FOR SPINE SURGERY;  Surgeon: Alleen BorneBryan K Bartle, MD;  Location: [facility];  Service: Thoracic;  Laterality: N/A;   Family History  Problem Relation Age of Onset  . Hypertension Father    Social History   Social History  . Marital status: Single    Spouse name: N/A  . Number of children: N/A  . Years of education: N/A   Occupational History  . Not on file.   Social History  Main Topics  . Smoking status: Never Smoker  . Smokeless tobacco: Never Used  . Alcohol use No  . Drug use: No  . Sexual activity: No   Other Topics Concern  . Not on file   Social History Narrative  . No narrative on file   Travis Fitzpatrick returns today with an MRI of the lumbar and thoracic spine. The biggest change is at T9-10 where he has had some very mild progression and is mildly stenotic to moderate at the T9-10 level. He has no new problems elsewhere. He took a fall sometime in late September and has an MCL injury as there was an MRI of the knee done. He does not use a cane and he does not use a walker, but he is tired of falling and just wants to walk better. He has some pain. I did give him Neurontin  today. We will see how that works.     He is alert, oriented by 4, answering all questions appropriately. Memory, language, attention span, and fund of knowledge are normal. Speech is clear and fluent. He has a spastic gait, is myelopathic and from the thoracic spine. He is going to give some time to think about what he wants to do. I will do the operation. I don't think it would change his pain. I don't think it would change his walking, but it is there, and I am not going to deny that opportunity.

## 2017-06-06 NOTE — Op Note (Signed)
06/06/2017  6:09 PM  PATIENT:  Travis Fitzpatrick  36 y.o. 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 He was positioned prone on a Wilson frame. His thoracic region was prepped and draped in a sterile manner. I opened the middle portion of the old incision. I used a 10 blade to open the skin and monopolar cautery to dissect through the scar tissue in the midline. I did expose the lamina of T7, and then was able to localize the 9/10 level. I confirmed my location with an intraoperative xray. The bone had regrown covering the spinal canal. I used the drill, and Kerrison punches to remove the bone overlying the thecal sac at T9, and T10. The sac was well decompressed. I then irrigated the wound, and closed. I approximated the thoracolumbar fascia and then the scar tissue so that the skin edges were well apposed. I applied dermabond for a sterile dressing.   PLAN OF CARE: Admit for overnight observation  PATIENT DISPOSITION:  PACU - hemodynamically stable.   Delay start of Pharmacological VTE agent (>24hrs) due to surgical blood loss or risk of bleeding:  yes

## 2017-06-07 ENCOUNTER — Encounter (HOSPITAL_COMMUNITY): Payer: Self-pay | Admitting: Neurosurgery

## 2017-06-07 DIAGNOSIS — M4804 Spinal stenosis, thoracic region: Secondary | ICD-10-CM | POA: Diagnosis not present

## 2017-06-07 NOTE — Progress Notes (Signed)
Patient ID: Travis Fitzpatrick, male   DOB: 1980-12-06, 36 y.o.   MRN: [id number] BP 118/60 (BP Location: Left Arm)   Pulse 80   Temp 98.4 F (36.9 C) (Oral)   Resp 18   Ht 5\' 9"  (1.753 m)   Wt (!) 139.3 kg (307 lb 1.6 oz)   SpO2 100%   BMI 45.35 kg/m  Alert and oriented x 4 Moving all extremities Wound is clean, dry Doing better, discharge tomorrow.

## 2017-06-07 NOTE — Progress Notes (Signed)
Pt ambulated to nursing station and back.  Became slightly dizzy while walking.  Continues to state that he has weakness on the right leg.  Presently, in chair with chair alarm set, call bell within reach.

## 2017-06-07 NOTE — Progress Notes (Signed)
Pt ambulated 140 ft a second time to nursing station and back.  No problems with dizziness on second walk.  He has been ambulating to the bathroom today, as well.

## 2017-06-08 DIAGNOSIS — M4804 Spinal stenosis, thoracic region: Secondary | ICD-10-CM | POA: Diagnosis not present

## 2017-06-08 MED ORDER — OXYCODONE-ACETAMINOPHEN 10-325 MG PO TABS: 1.0000 | ORAL_TABLET | ORAL | 0 refills | Status: DC | PRN

## 2017-06-08 MED ORDER — METHOCARBAMOL 500 MG PO TABS: 500.0000 mg | ORAL_TABLET | Freq: Four times a day (QID) | ORAL | 0 refills | Status: DC

## 2017-06-08 NOTE — Progress Notes (Signed)
No issues overnight. Appropriate back pain. Ambulating and tolerating diet.  EXAM:  BP 112/68 (BP Location: Left Arm)   Pulse 76   Temp 97.8 F (36.6 C) (Oral)   Resp 18   Ht 5\' 9"  (1.753 m)   Wt (!) 139.3 kg (307 lb 1.6 oz)   SpO2 100%   BMI 45.35 kg/m   Awake, alert, oriented  Speech fluent, appropriate  CN grossly intact  MAE well  IMPRESSION:  36 y.o. male POD# 2 thoracic lami, doing well  PLAN: - d/c home today

## 2017-06-08 NOTE — Progress Notes (Signed)
Patient ready for discharge to home; discharge instructions given and reviewed; Rx's given; patient discharged out via wheelchair accompanied by friend.

## 2017-06-08 NOTE — Discharge Summary (Signed)
  Physician Discharge Summary  Patient ID: Travis Fitzpatrick MRN: [id number] DOB/AGE: 05/02/1981 36 y.o.  Admit date: 06/06/2017 Discharge date: 06/08/2017  Admission Diagnoses:  Thoracic stenosis  Discharge Diagnoses:  Same Active Problems:   Thoracic spinal stenosis   Discharged Condition: Stable  Hospital Course:  Travis Fitzpatrick is a [age] y.o. male admitted after thoracic laminectomy for stenosis. He is ambulating well, tolerating diet, voiding normally.  Treatments: Surgery - thoracic laminectomy  Discharge Exam: Blood pressure 112/68, pulse 76, temperature 97.8 F (36.6 C), temperature source Oral, resp. rate 18, height 5\' 9"  (1.753 m), weight (!) 139.3 kg (307 lb 1.6 oz), SpO2 100 %. Awake, alert, oriented Speech fluent, appropriate CN grossly intact 5/5 BUE/BLE Wound c/d/i  Disposition: 01-Home or Self Care   Allergies as of 06/08/2017      Reactions   Aspirin Rash   Other Itching, Other (See Comments)   Apples,oranges, grapes (skin on outside)-  throat itches      Medication List    TAKE these medications   gabapentin 300 MG capsule Commonly known as:  NEURONTIN Take 300 mg by mouth 3 (three) times daily.   ibuprofen 800 MG tablet Commonly known as:  ADVIL,MOTRIN Take 800 mg by mouth every 6 (six) hours as needed for pain.   methocarbamol 500 MG tablet Commonly known as:  ROBAXIN Take 1 tablet (500 mg total) by mouth 4 (four) times daily.   oxyCODONE-acetaminophen 10-325 MG tablet Commonly known as:  PERCOCET Take 1 tablet by mouth every 4 (four) hours as needed for pain.   tiZANidine 4 MG tablet Commonly known as:  ZANAFLEX Take 1 tablet (4 mg total) by mouth every 6 (six) hours as needed for muscle spasms.      Follow-up Information    Coletta Memosabbell, Kyle, MD Follow up in 3 week(s).   Specialty:  Neurosurgery Contact information: [address] [place] [address] [phone]           Signed: Lisbeth RenshawUNDKUMAR, Ishmail Mcmanamon,  Salena SanerC 06/08/2017, 9:31 AM

## 2017-06-12 NOTE — Anesthesia Postprocedure Evaluation (Signed)
Anesthesia Post Note  Patient: Travis Fitzpatrick  Procedure(s) Performed: LAMINECTOMY THORACIC NINE-TEN (N/A )     Patient location during evaluation: PACU Anesthesia Type: General Level of consciousness: awake Pain management: pain level controlled Vital Signs Assessment: post-procedure vital signs reviewed and stable Respiratory status: spontaneous breathing Cardiovascular status: stable Anesthetic complications: no    Last Vitals:  Vitals:   06/08/17 0602 06/08/17 1000  BP: 112/68 134/61  Pulse: 76 93  Resp: 18 17  Temp: 36.6 C 37 C  SpO2: 100% 100%    Last Pain:  Vitals:   06/08/17 1000  TempSrc: Oral  PainSc:                  Unknown Flannigan

## 2018-06-02 IMAGING — MR MR KNEE*R* W/O CM
6 series · 39 of 40 positions shown · non-contrast
Comparison: None.

CLINICAL DATA: Status post fall.  Right knee pain.

EXAM:
MRI OF THE RIGHT KNEE WITHOUT CONTRAST
TECHNIQUE: Multiplanar, multisequence MR imaging of the knee was performed. No
intravenous contrast was administered.

[Series 4: T1 · coronal · 3.0mm · 0.50mm/px · 6 of 37 slices shown]
[im 1/37]
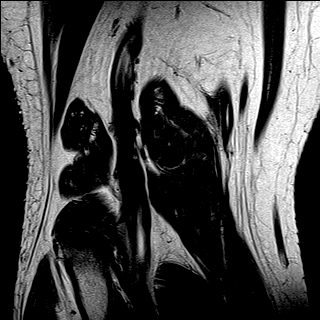
[im 7/37]
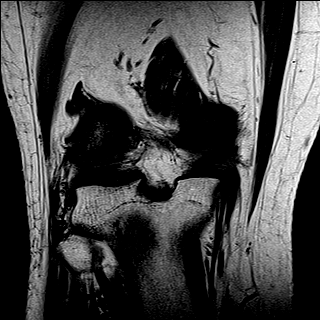
[im 13/37]
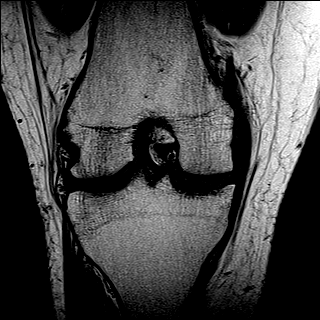
[im 19/37]
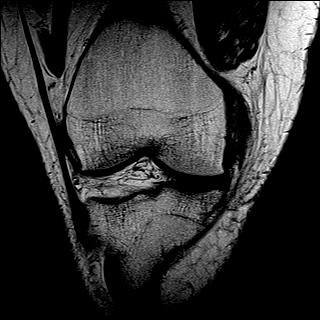
[im 25/37]
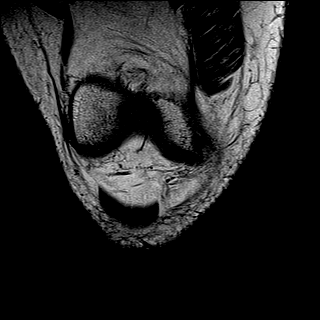
[im 31/37]
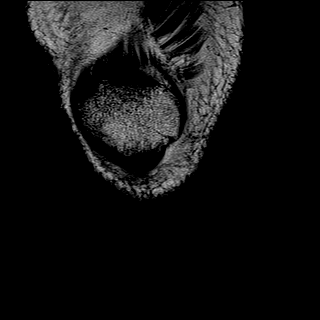

[Series 5: T2 fat-sat · coronal · 3.0mm · 0.50mm/px · 7 of 37 slices shown]
[im 1/37]
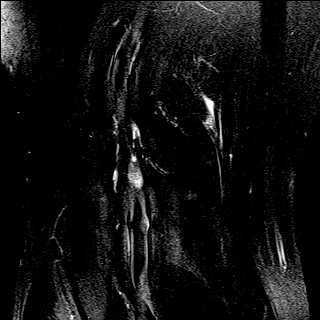
[im 7/37]
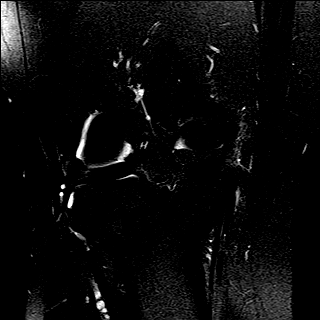
[im 13/37]
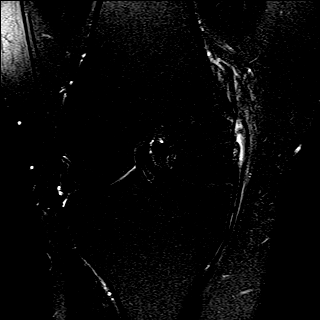
[im 19/37]
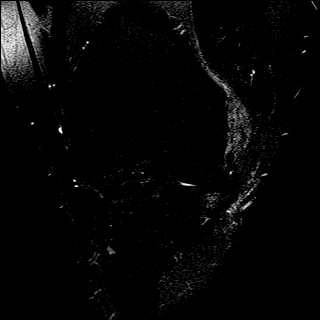
[im 25/37]
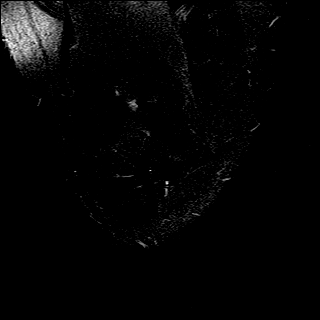
[im 31/37]
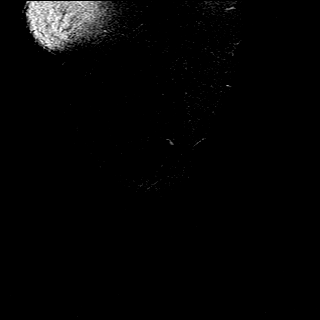
[im 37/37]
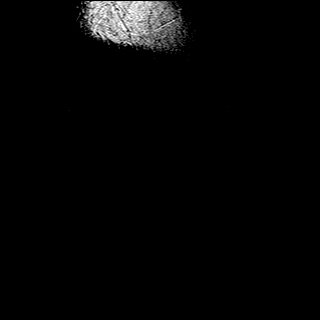

[Series 6: PD fat-sat · axial · 3.0mm · 0.35mm/px · z∈[-46,+92]mm · 8 of 43 slices shown (1 of 4)]
[im 1/43]
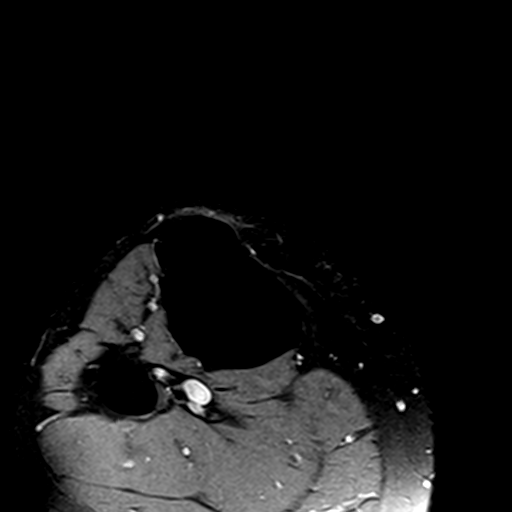
[im 7/43]
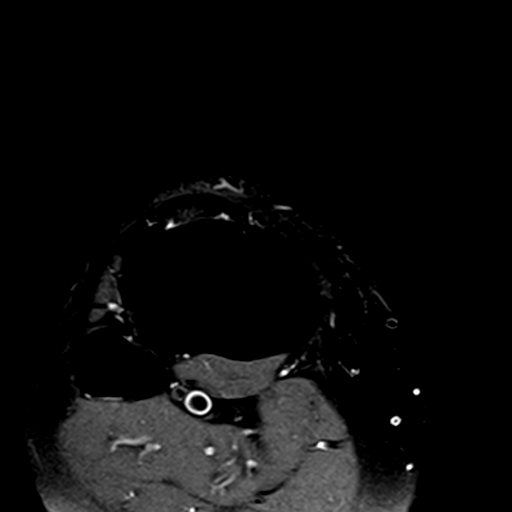
[im 13/43]
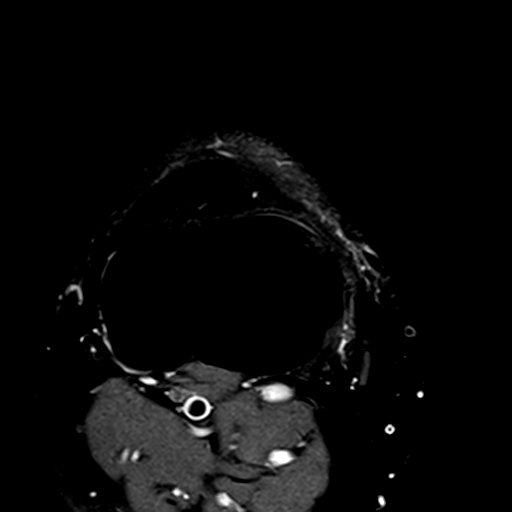
[im 19/43]
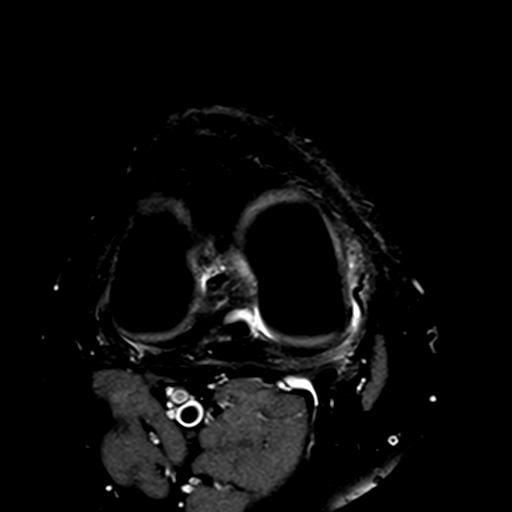
[im 25/43]
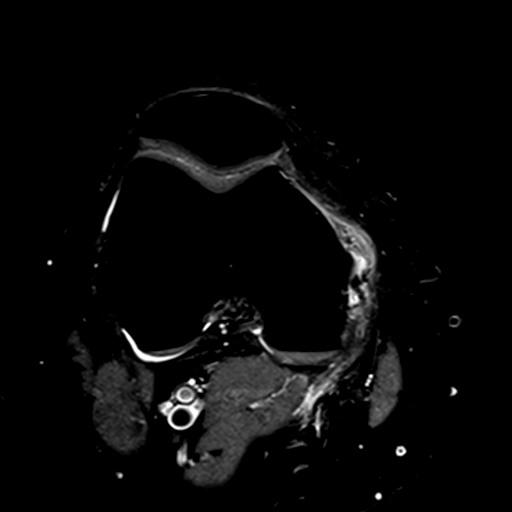
[im 31/43]
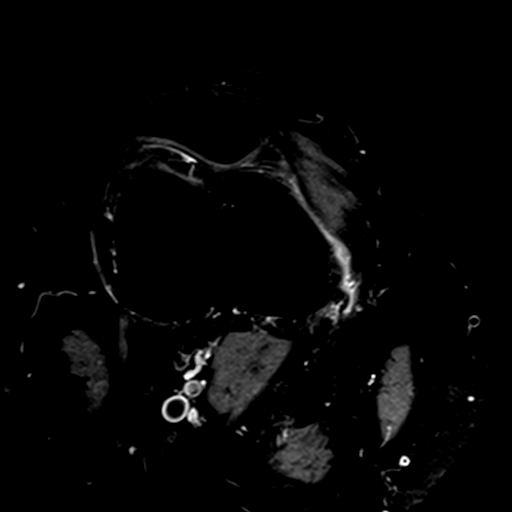
[im 37/43]
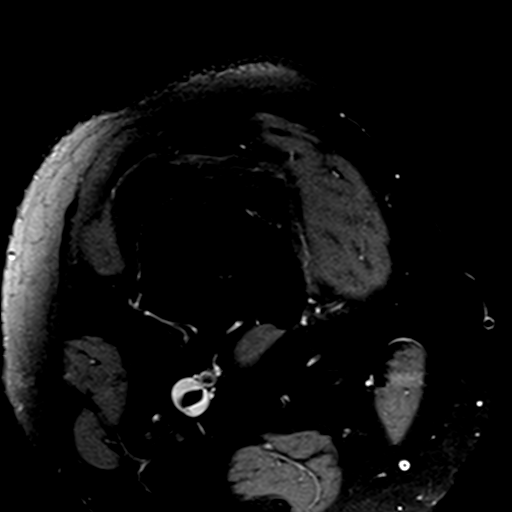
[im 43/43]
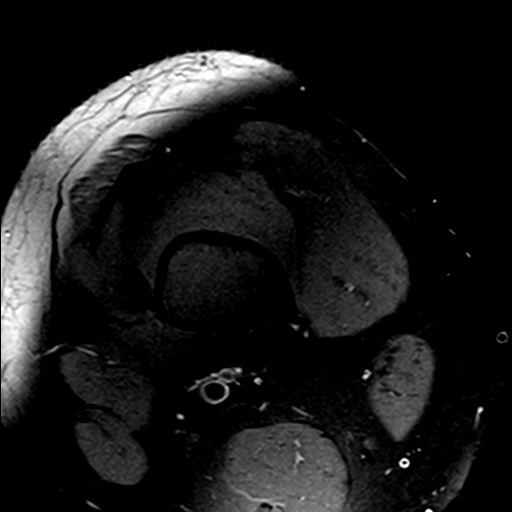

[Series 7: PD fat-sat · coronal · 3.0mm · 0.62mm/px · 7 of 37 slices shown (2 of 4)]
[im 1/37]
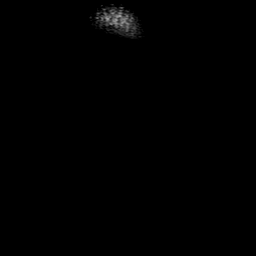
[im 7/37]
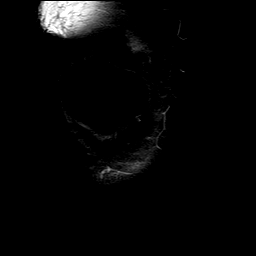
[im 13/37]
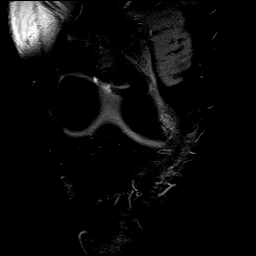
[im 19/37]
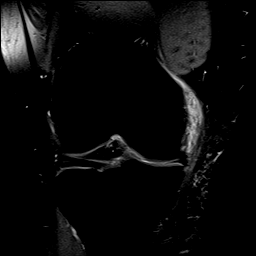
[im 25/37]
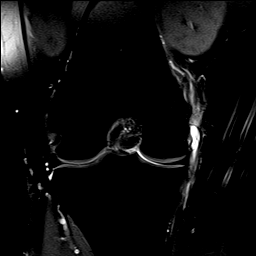
[im 31/37]
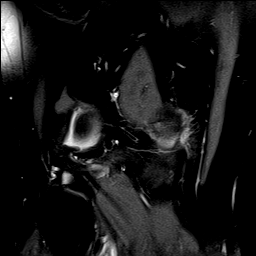
[im 37/37]
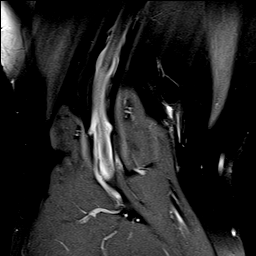

[Series 8: PD fat-sat · sagittal · 3.0mm · 0.62mm/px · 7 of 35 slices shown (3 of 4)]
[im 1/35]
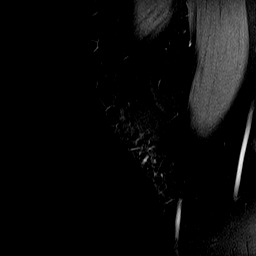
[im 6/35]
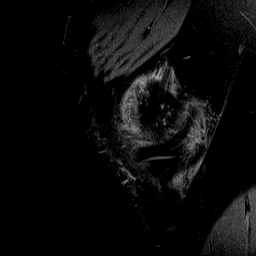
[im 12/35]
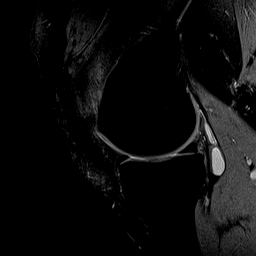
[im 18/35]
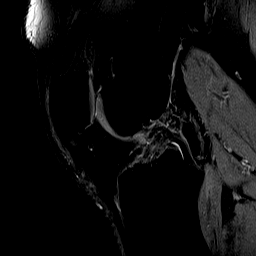
[im 23/35]
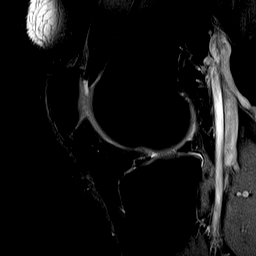
[im 29/35]
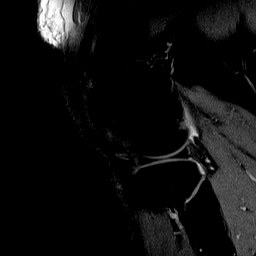
[im 35/35]
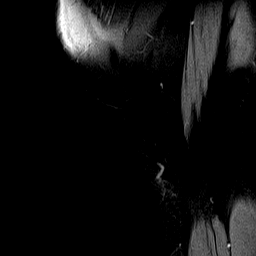

[Series 9: PD fat-sat · oblique · 2.0mm · 0.62mm/px · 4 of 19 slices shown (4 of 4)]
[im 1/19]
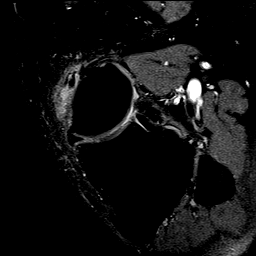
[im 7/19]
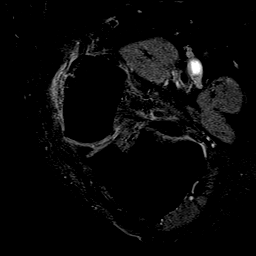
[im 13/19]
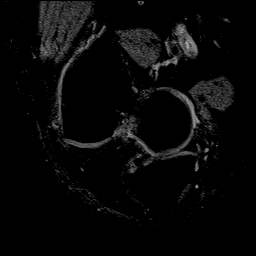
[im 19/19]
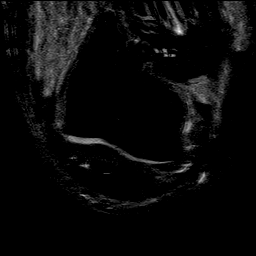

[39 of 40 positions shown; findings below may reference images not displayed]

FINDINGS: MENISCI

Medial meniscus: Attenuation of the free edge of the body of the
medial meniscus concerning for a small radial tear.

Lateral meniscus:  Intact.

LIGAMENTS

Cruciates:  Intact ACL and PCL.

Collaterals: Fluid superficial and deep to the MCL most concerning
for grade 1 MCL injury with possible MCL bursitis. Intact lateral
collateral ligament complex.

CARTILAGE

Patellofemoral:  No chondral defect.

Medial:  No chondral defect.

Lateral:  No chondral defect.

Joint:  No joint effusion. Normal Hoffa's fat. No plical thickening.

Popliteal Fossa:  Small Baker's cyst.  Intact popliteus tendon.

Extensor Mechanism: Intact quadriceps tendon and patellar tendon.
Intact medial and lateral patellar retinaculum. Intact MPFL.

Bones:  No marrow signal abnormality.  No fracture or dislocation.

Other: No fluid collection or hematoma.
IMPRESSION: 1. Attenuation of the free edge of the body of the medial meniscus
concerning for a small radial tear.
2. Fluid superficial and deep to the MCL most concerning for grade 1
MCL injury with possible MCL bursitis.

## 2020-01-12 ENCOUNTER — Emergency Department: Payer: Medicare Other

## 2020-01-12 ENCOUNTER — Encounter: Payer: Self-pay | Admitting: Emergency Medicine

## 2020-01-12 ENCOUNTER — Other Ambulatory Visit: Payer: Self-pay

## 2020-01-12 ENCOUNTER — Emergency Department
Admission: EM | Admit: 2020-01-12 | Discharge: 2020-01-12 | Disposition: A | Discharge status: Home / Self Care | Payer: Medicare Other | Attending: Emergency Medicine | Admitting: Emergency Medicine

## 2020-01-12 DIAGNOSIS — Y9389 Activity, other specified: Secondary | ICD-10-CM | POA: Diagnosis not present

## 2020-01-12 DIAGNOSIS — Y999 Unspecified external cause status: Secondary | ICD-10-CM | POA: Diagnosis not present

## 2020-01-12 DIAGNOSIS — X58XXXA Exposure to other specified factors, initial encounter: Secondary | ICD-10-CM | POA: Insufficient documentation

## 2020-01-12 DIAGNOSIS — Y929 Unspecified place or not applicable: Secondary | ICD-10-CM | POA: Insufficient documentation

## 2020-01-12 DIAGNOSIS — S93401A Sprain of unspecified ligament of right ankle, initial encounter: Secondary | ICD-10-CM | POA: Diagnosis not present

## 2020-01-12 DIAGNOSIS — S99911A Unspecified injury of right ankle, initial encounter: Secondary | ICD-10-CM | POA: Diagnosis present

## 2020-01-12 MED ORDER — IBUPROFEN 600 MG PO TABS: 600.0000 mg | ORAL_TABLET | Freq: Three times a day (TID) | ORAL | 0 refills | Status: DC | PRN

## 2020-01-12 NOTE — ED Triage Notes (Signed)
Pt reports back gave out last night and when he fell he felt a pop in his right ankle.  Cannot walk on ankle

## 2020-01-12 NOTE — ED Notes (Signed)
See triage note  Presents with right ankle pain   States twisted ankle last pm   Felt a pop  Min swelling noted  goodpulses

## 2020-01-12 NOTE — ED Provider Notes (Signed)
[facility] Emergency Department Provider Note   ____________________________________________   First MD Initiated Contact with Patient 01/12/20 [phone]     (approximate)  I have reviewed the triage vital signs and the nursing notes.   HISTORY  Chief Complaint Ankle Pain    HPI Travis Fitzpatrick is a 39 y.o. male patient presents with right ankle pain secondary to recent incident occurred last night.  Patient said he has chronic back pain.  Patient is back "gave out" causing a twisting incident to his right ankle.  Patient also has degenerative changes in his right ankle.  Patient states pain increased with ambulation.  Rates pain as 8/10.  Described the pain as "achy".  No palliative measure for complaint.         Past Medical History:  Diagnosis Date  . Bowel trouble    urgency  . Medical history non-contributory   . Urinary urgency     Patient Active Problem List   Diagnosis Date Noted  . 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 Stenosis, spinal, thoracic 12/15/2014  . 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 BACK SURGERY  2010  . CIRCUMCISION    . LUMBAR LAMINECTOMY/DECOMPRESSION MICRODISCECTOMY  07/20/2011   Procedure: LUMBAR LAMINECTOMY/DECOMPRESSION MICRODISCECTOMY;  Surgeon: Carmela Hurt;  Location: [facility];  Service: Neurosurgery;  Laterality: N/A;  right thoracotomy with thoracic eight-nine discectomy and fusion  . THORACIC DISCECTOMY  07/16/2012   Procedure: THORACIC DISCECTOMY;  Surgeon: Carmela Hurt, MD;  Location: [facility];  Service: Neurosurgery;  Laterality: Right;  RIGHT Thoracic seven-eight  thoracic diskectomy via thoracotomy by dr Laneta Simmers  . THORACIC DISCECTOMY N/A 12/15/2014   Procedure: THORACIC SEVEN TO THORACIC NINE Laminectomy ;  Surgeon: Coletta Memos, MD;  Location: [facility];  Service: Neurosurgery;  Laterality: N/A;  . THORACIC DISCECTOMY N/A 06/06/2017   Procedure: LAMINECTOMY THORACIC NINE-TEN;  Surgeon: Coletta Memos, MD;  Location: [facility];  Service: Neurosurgery;  Laterality: N/A;  LAMINECTOMY THORACIC NINE-TEN  . THORACOTOMY  07/20/2011   Procedure: THORACOTOMY OPEN FOR SPINE SURGERY;  Surgeon: Norton Blizzard, MD;  Location: [facility];  Service: Vascular;  Laterality: N/A;  . THORACOTOMY  07/16/2012   Procedure: THORACOTOMY OPEN FOR SPINE SURGERY;  Surgeon: Alleen Borne, MD;  Location: [facility];  Service: Thoracic;  Laterality: N/A;    Prior to Admission medications   Medication Sig Start Date End Date Taking? Authorizing Provider  gabapentin (NEURONTIN) 300 MG capsule Take 300 mg by mouth 3 (three) times daily.    [provider]  ibuprofen (ADVIL) 600 MG tablet Take 1 tablet (600 mg total) by mouth every 8 (eight) hours as needed. 01/12/20   Joni Reining, PA-C  ibuprofen (ADVIL,MOTRIN) 800 MG tablet Take 800 mg by mouth every 6 (six) hours as needed for pain.     [provider]    Allergies Aspirin and Other  Family History  Problem Relation Age of Onset  . Hypertension Father     Social History Social History   Tobacco Use  . Smoking status: Never Smoker  . Smokeless tobacco: Never Used  Substance Use Topics  . Alcohol use: No    Alcohol/week: 0.0 standard drinks  . Drug use: No    Review of Systems Constitutional: No fever/chills Eyes: No visual changes. ENT: No sore  throat. Cardiovascular: Denies chest pain. Respiratory: Denies shortness of breath. Gastrointestinal: No abdominal pain.  No nausea, no vomiting.  No diarrhea.  No constipation. Genitourinary: Negative for dysuria. Musculoskeletal: Positive for chronic back pain.  Right ankle pain. Skin: Negative for rash. Neurological: Negative for headaches, focal weakness or numbness. Allergic/Immunilogical:  Aspirin. ____________________________________________   PHYSICAL EXAM:  VITAL SIGNS: ED Triage Vitals  Enc Vitals Group     BP 01/12/20 0915 (!) 150/79     Pulse Rate 01/12/20 0915 100     Resp 01/12/20 0915 18     Temp 01/12/20 0915 97.8 F (36.6 C)     Temp Source 01/12/20 0915 Oral     SpO2 01/12/20 0915 98 %     Weight 01/12/20 0912 (!) 320 lb (145.2 kg)     Height 01/12/20 0912 5\' 9"  (1.753 m)     Head Circumference --      Peak Flow --      Pain Score 01/12/20 0911 8     Pain Loc --      Pain Edu? --      Excl. in GC? --     Constitutional: Alert and oriented. Well appearing and in no acute distress.  Obesity. Cardiovascular: Normal rate, regular rhythm. Grossly normal heart sounds.  Good peripheral circulation.  Elevated blood pressure. Respiratory: Normal respiratory effort.  No retractions. Lungs CTAB. Gastrointestinal: Soft and nontender. No distention. No abdominal bruits. No CVA tenderness. Musculoskeletal: No obvious deformity to the right ankle.  Moderate guarding palpation lateral malleolus .   Neurologic:  Normal speech and language. No gross focal neurologic deficits are appreciated. No gait instability. Skin:  Skin is warm, dry and intact. No rash noted. Psychiatric: Mood and affect are normal. Speech and behavior are normal.  ____________________________________________   LABS (all labs ordered are listed, but only abnormal results are displayed)  Labs Reviewed - No data to display ____________________________________________  EKG   ____________________________________________  RADIOLOGY  ED MD interpretation:    Official radiology report(s): DG Ankle Complete Right  Result Date: 01/12/2020 CLINICAL DATA:  Ankle pain, twisted ankle last evening. EXAM: RIGHT ANKLE - COMPLETE 3+ VIEW COMPARISON:  02/02/2015 FINDINGS: Degenerative changes about the ankle and visualized midfoot. No sign of acute fracture or subluxation. Minimal soft tissue swelling  over lateral malleolus. IMPRESSION: 1. No acute fracture or dislocation. 2. Degenerative changes as before. 3. Minimal soft tissue swelling over the lateral malleolus. Electronically Signed   By: 02/04/2015 M.D.   On: 01/12/2020 09:37    ____________________________________________   PROCEDURES  Procedure(s) performed (including Critical Care):  Procedures   ____________________________________________   INITIAL IMPRESSION / ASSESSMENT AND PLAN / ED COURSE  As part of my medical decision making, I reviewed the following data within the electronic MEDICAL RECORD NUMBER     Patient presents with chronic pain secondary to recent incident last night.  Discussed x-ray findings with patient.  Patient complaint physical exam consistent with ankle sprain.  Patient given discharge care instruction.  Patient advised to reapply his ankle splint and ambulate with crutches for 3 to 5 days as needed.  [facility]    Joann 03/13/2020 was evaluated in Emergency Department on 01/12/2020 for the symptoms described in the history of present illness. He was evaluated in the context of the global COVID-19 pandemic, which necessitated consideration that the patient might be at risk for infection with the SARS-CoV-2 virus that causes COVID-19. Institutional protocols and algorithms  that pertain to the evaluation of patients at risk for COVID-19 are in a state of rapid change based on information released by regulatory bodies including the CDC and federal and state organizations. These policies and algorithms were followed during the patient's care in the ED.       ____________________________________________   FINAL CLINICAL IMPRESSION(S) / ED DIAGNOSES  Final diagnoses:  Sprain of right ankle, unspecified ligament, initial encounter     ED Discharge Orders         Ordered    ibuprofen (ADVIL) 600 MG tablet  Every 8 hours PRN     01/12/20 0950           Note:  This  document was prepared using Dragon voice recognition software and may include unintentional dictation errors.    Sable Feil, PA-C 01/12/20 6811    Carrie Mew, MD 01/12/20 1313

## 2020-01-12 NOTE — Discharge Instructions (Signed)
Follow discharge care instruction.  Wear ankle support and ambulate with crutches for 3 to 5 days as needed.

## 2020-01-22 ENCOUNTER — Other Ambulatory Visit (INDEPENDENT_AMBULATORY_CARE_PROVIDER_SITE_OTHER): Payer: Self-pay | Admitting: Physician Assistant

## 2020-02-16 ENCOUNTER — Other Ambulatory Visit (INDEPENDENT_AMBULATORY_CARE_PROVIDER_SITE_OTHER): Payer: Self-pay | Admitting: Physician Assistant

## 2020-07-18 ENCOUNTER — Other Ambulatory Visit: Payer: Self-pay | Admitting: Neurosurgery

## 2020-07-18 DIAGNOSIS — M5104 Intervertebral disc disorders with myelopathy, thoracic region: Secondary | ICD-10-CM

## 2020-07-28 ENCOUNTER — Ambulatory Visit
Admission: RE | Admit: 2020-07-28 | Discharge: 2020-07-28 | Disposition: A | Discharge status: Home / Self Care | Payer: Medicare Other | Source: Ambulatory Visit | Attending: Neurosurgery | Admitting: Neurosurgery

## 2020-07-28 ENCOUNTER — Other Ambulatory Visit: Payer: Self-pay

## 2020-07-28 DIAGNOSIS — M5104 Intervertebral disc disorders with myelopathy, thoracic region: Secondary | ICD-10-CM

## 2020-07-28 MED ORDER — GADOBUTROL 1 MMOL/ML IV SOLN
10.0000 mL | Freq: Once | INTRAVENOUS | Status: AC | PRN
  Administered 2020-07-28: 10 mL via INTRAVENOUS

## 2020-12-06 ENCOUNTER — Encounter: Payer: Self-pay | Admitting: Physical Medicine & Rehabilitation

## 2021-01-11 ENCOUNTER — Encounter: Payer: Self-pay | Admitting: Physical Medicine & Rehabilitation

## 2021-01-11 ENCOUNTER — Encounter: Payer: Medicare Other | Attending: Physical Medicine & Rehabilitation | Admitting: Physical Medicine & Rehabilitation

## 2021-01-11 ENCOUNTER — Other Ambulatory Visit: Payer: Self-pay

## 2021-01-11 VITALS — BP 115/77 | HR 88 | Temp 98.8°F | Wt 332.6 lb

## 2021-01-11 DIAGNOSIS — M4804 Spinal stenosis, thoracic region: Secondary | ICD-10-CM | POA: Diagnosis not present

## 2021-01-11 DIAGNOSIS — M5104 Intervertebral disc disorders with myelopathy, thoracic region: Secondary | ICD-10-CM | POA: Diagnosis not present

## 2021-01-11 DIAGNOSIS — M47816 Spondylosis without myelopathy or radiculopathy, lumbar region: Secondary | ICD-10-CM | POA: Diagnosis not present

## 2021-01-11 DIAGNOSIS — M4714 Other spondylosis with myelopathy, thoracic region: Secondary | ICD-10-CM | POA: Diagnosis not present

## 2021-01-11 MED ORDER — DICLOFENAC SODIUM 75 MG PO TBEC: 75.0000 mg | DELAYED_RELEASE_TABLET | Freq: Two times a day (BID) | ORAL | 3 refills | Status: DC

## 2021-01-11 MED ORDER — GABAPENTIN 600 MG PO TABS: 600.0000 mg | ORAL_TABLET | Freq: Three times a day (TID) | ORAL | 3 refills | Status: DC

## 2021-01-11 NOTE — Progress Notes (Addendum)
Subjective:    Patient ID: Travis Fitzpatrick, male    DOB: [birth year], 40 y.o.   MRN: [id number]  HPI   This is an initial office visit for Travis Fitzpatrick who is a pleasant 40 yo obese male with hx of thoracic myelopathy s/p thoracic laminectomies the most recent of which was in October of 2018 where he had significant stenosis at T9-10. He's had fusion T8-9 as well.   He has been treated chronically by Dr. Coletta Memos who referred to our office for this evaluation. After the most recent surgery patient reported ongoing weakness, balance loss and pain in both legs. Pt went through a course of PT with modest results. There has also been a question of impaired bowel and bladder control.    MRI from December revealed the folowing:  L3-4: Shallow disc bulge and mild facet degenerative changes without significant spinal canal or neural foraminal stenosis.  L4-5: Disc bulge with superimposed tiny right central disc extrusion migrating superiorly and causing indentation on the thecal sac. There also moderate facet degenerative changes contributing for mild spinal canal stenosis with moderate right and mild left neural foraminal narrowing.  L5-S1: Disc bulge with associated osteophytic component and moderate facet degenerative changes resulting in severe bilateral neural foraminal narrowing, progressed from prior MRI.    EMG/NCS were performed as well without any abnormalities. The patient was seen in neurosurgical f/u this Spring and increasing lower extremity, back pain and urinary incontinence were noted.  Pt was referred here for assessment of the above.   He first has symptoms in his back in 2009 when he awoke with loss of sensation from his belly down with associated weakness. It appears that the stenosis and injury was at T7-T10.   At the end of last year he had worsening pain in his right leg, (shock-like) from his back to the toes, with similar but less severe symptoms on left.  There are associated spasms. Symptoms can happen any time but are most severe when he's up and moving around. He can walk about 2-3 minutes before his back tightens up and his legs are going to buckle with associated numbness. He was able to walk in from parking lot today. Walking up or down hills is difficult.   HE has used ibuprofen with some benefit. He stopped gabapentin as it wasn't making much of a difference at 300mg  tid   Pain Inventory Average Pain 8 Pain Right Now 7 My pain is constant, sharp, burning, stabbing and tingling  In the last 24 hours, has pain interfered with the following? General activity 7 Relation with others 0 Enjoyment of life 8 What TIME of day is your pain at its worst? varies Sleep (in general) Poor  Pain is worse with: walking, bending, sitting, inactivity, standing and some activites Pain improves with: medication Relief from Meds: 5  walk with assistance use a cane use a walker how many minutes can you walk? 2-3 mins ability to climb steps?  yes do you drive?  yes use a wheelchair transfers alone Do you have any goals in this area?  yes  disabled: date disabled 2010 Do you have any goals in this area?  yes  bladder control problems bowel control problems weakness numbness tremor tingling trouble walking spasms dizziness  Any changes since last visit?  yes New Patient  Any changes since last visit?  no New Patient    Family History  Problem Relation Age of Onset  . Hypertension Father  Social History   Socioeconomic History  . Marital status: Single    Spouse name: Not on file  . Number of children: Not on file  . Years of education: Not on file  . Highest education level: Not on file  Occupational History  . Not on file  Tobacco Use  . Smoking status: Never Smoker  . Smokeless tobacco: Never Used  Vaping Use  . Vaping Use: Never used  Substance and Sexual Activity  . Alcohol use: No    Alcohol/week: 0.0  standard drinks  . Drug use: No  . Sexual activity: Never  Other Topics Concern  . Not on file  Social History Narrative  . Not on file   Social Determinants of Health   Financial Resource Strain: Not on file  Food Insecurity: Not on file  Transportation Needs: Not on file  Physical Activity: Not on file  Stress: Not on file  Social Connections: Not on file   Past Surgical History:  Procedure Laterality Date  . BACK SURGERY  2010  . CIRCUMCISION    . LUMBAR LAMINECTOMY/DECOMPRESSION MICRODISCECTOMY  07/20/2011   Procedure: LUMBAR LAMINECTOMY/DECOMPRESSION MICRODISCECTOMY;  Surgeon: Carmela Hurt;  Location: [facility];  Service: Neurosurgery;  Laterality: N/A;  right thoracotomy with thoracic eight-nine discectomy and fusion  . THORACIC DISCECTOMY  07/16/2012   Procedure: THORACIC DISCECTOMY;  Surgeon: Carmela Hurt, MD;  Location: [facility];  Service: Neurosurgery;  Laterality: Right;  RIGHT Thoracic seven-eight  thoracic diskectomy via thoracotomy by dr Laneta Simmers  . THORACIC DISCECTOMY N/A 12/15/2014   Procedure: THORACIC SEVEN TO THORACIC NINE Laminectomy ;  Surgeon: Coletta Memos, MD;  Location: [facility];  Service: Neurosurgery;  Laterality: N/A;  . THORACIC DISCECTOMY N/A 06/06/2017   Procedure: LAMINECTOMY THORACIC NINE-TEN;  Surgeon: Coletta Memos, MD;  Location: [facility];  Service: Neurosurgery;  Laterality: N/A;  LAMINECTOMY THORACIC NINE-TEN  . THORACOTOMY  07/20/2011   Procedure: THORACOTOMY OPEN FOR SPINE SURGERY;  Surgeon: Norton Blizzard, MD;  Location: [facility];  Service: Vascular;  Laterality: N/A;  . THORACOTOMY  07/16/2012   Procedure: THORACOTOMY OPEN FOR SPINE SURGERY;  Surgeon: Alleen Borne, MD;  Location: [facility];  Service: Thoracic;  Laterality: N/A;   Past Medical History:  Diagnosis Date  . Bowel trouble    urgency  . Medical history non-contributory   . Urinary urgency    There were no vitals taken for this visit.  Opioid Risk Score:    Fall Risk Score:  `1  Depression screen PHQ 2/9  No flowsheet data found. Review of Systems  Respiratory: Positive for shortness of breath.   Gastrointestinal: Positive for constipation.  Genitourinary: Positive for urgency.       Hard to start stream  Musculoskeletal: Positive for back pain and gait problem.       Spasms  Neurological: Positive for dizziness, weakness and numbness.       Tingling  All other systems reviewed and are negative.      Objective:   Physical Exam Gen: no distress, normal appearing.  Obese HEENT: oral mucosa pink and moist, NCAT Cardio: Reg rate Chest: normal effort, normal rate of breathing Abd: soft, non-distended Ext: no edema Psych: pleasant, normal affect Skin: intact Neuro: Cranial nerve exam intact.  Cognitively appropriate.  Patient with a mid thoracic sensory level approximately T8 and location.  Sensory loss most dense in the trunk but also with sensory loss in both legs.  Strength is actually  5 out of 5 in both lower extremities with some limitations at times due to his back pain.  Reflexes are trace.  There is no resting tone.  He has full range of motion.  In standing he has some difficulties with balance and with change of directions due to his sensory deficits. Musculoskeletal: Low back is tender along the waistline.  Pain is more central than lateralized.  Pain is worse with extension as well as bilateral facet maneuvers.  He also has some discomfort with forward flexion.  Seated slump test causes pain in low back either side.        Assessment & Plan:  1. Hx of thoracic myelopathy with residual sensory deficits from trunk down.  He has a T8 sensory level on exam 2.  History of chronic low back pain.  Exam most consistent with facet arthropathy.  MRI from December reveals significant facet arthropathy at L4-L5 as well as L5-S1 with foraminal stenosis bilaterally.  I suspect the foraminal stenosis at L5-S1 is contributing to his  radicular symptoms in the legs.  He also has a small extruded disc at L4-L5 which could be contributing to his low back pain.  3.  His more recent lumbar symptoms are overlapping his pre-existing sensory/proprioceptive deficits and are worsening his gait and balance. 4. See no sign of neurogenic bowel or bladder. Pt does have back pain which he pushes to empty his bowels on toilet. 5. Morbid obesity   Plan: 1.  Resume trial of gabapentin but quickly titrated up to 600 mg 3 times daily.  Titration schedule was provided 2.  Trial of diclofenac 75 mg twice daily with food.  Patient was told to stop ibuprofen 3.  A referral was made to outpatient physical therapy at [facility] to address lumbar range of motion strengthening and spine/trunk lengthening exercises.  Also with a focus on establishing a home exercise program. 4.  He may require lumbar medial branch blocks. I reviewed his MRI's with him.  He wishes to pursue conservative avenues first and given the chronicity of his pain this is the most appropriate path to take initially. 5. Forty-five minutes of face to face patient care time were spent during this visit. All questions were encouraged and answered. Follow up with me in 2 mos.

## 2021-01-11 NOTE — Patient Instructions (Addendum)
PLEASE FEEL FREE TO CALL OUR OFFICE WITH ANY PROBLEMS OR QUESTIONS [phone])  GABAPENTIN 1/2 TAB THREE X DAY FOR 5 DAYS, THEN IF TOLERATED INCREASE TO FULL TAB.

## 2021-01-18 ENCOUNTER — Other Ambulatory Visit: Payer: Self-pay

## 2021-01-18 ENCOUNTER — Ambulatory Visit: Payer: Medicare Other | Attending: Physical Medicine & Rehabilitation

## 2021-01-18 DIAGNOSIS — R262 Difficulty in walking, not elsewhere classified: Secondary | ICD-10-CM | POA: Diagnosis present

## 2021-01-18 DIAGNOSIS — M6281 Muscle weakness (generalized): Secondary | ICD-10-CM | POA: Insufficient documentation

## 2021-01-18 DIAGNOSIS — R2681 Unsteadiness on feet: Secondary | ICD-10-CM | POA: Diagnosis present

## 2021-01-18 DIAGNOSIS — R269 Unspecified abnormalities of gait and mobility: Secondary | ICD-10-CM | POA: Diagnosis not present

## 2021-01-18 NOTE — Therapy (Signed)
[facility] [address], [place], [address] Phone: [phone]   Fax:  [phone]  Physical Therapy Evaluation  Patient Details  Name: Travis Fitzpatrick MRN: [id number] Date of Birth: 08/14/1980 Referring Provider (PT): Faith Rogue   Encounter Date: 01/18/2021   PT End of Session - 01/18/21 1138    Visit Number 1    Number of Visits 21    Date for PT Re-Evaluation 03/29/21    PT Start Time 0946    PT Stop Time 1034    PT Time Calculation (min) 48 min    Activity Tolerance Patient tolerated treatment well;Patient limited by pain    Behavior During Therapy [facility] for tasks assessed/performed           Past Medical History:  Diagnosis Date  . Bowel trouble    urgency  . Medical history non-contributory   . Urinary urgency     Past Surgical History:  Procedure Laterality Date  . BACK SURGERY  2010  . CIRCUMCISION    . LUMBAR LAMINECTOMY/DECOMPRESSION MICRODISCECTOMY  07/20/2011   Procedure: LUMBAR LAMINECTOMY/DECOMPRESSION MICRODISCECTOMY;  Surgeon: Carmela Hurt;  Location: [facility];  Service: Neurosurgery;  Laterality: N/A;  right thoracotomy with thoracic eight-nine discectomy and fusion  . THORACIC DISCECTOMY  07/16/2012   Procedure: THORACIC DISCECTOMY;  Surgeon: Carmela Hurt, MD;  Location: [facility];  Service: Neurosurgery;  Laterality: Right;  RIGHT Thoracic seven-eight  thoracic diskectomy via thoracotomy by dr Laneta Simmers  . THORACIC DISCECTOMY N/A 12/15/2014   Procedure: THORACIC SEVEN TO THORACIC NINE Laminectomy ;  Surgeon: Coletta Memos, MD;  Location: [facility];  Service: Neurosurgery;  Laterality: N/A;  . THORACIC DISCECTOMY N/A 06/06/2017   Procedure: LAMINECTOMY THORACIC NINE-TEN;  Surgeon: Coletta Memos, MD;  Location: [facility];  Service: Neurosurgery;  Laterality: N/A;  LAMINECTOMY THORACIC NINE-TEN  . THORACOTOMY  07/20/2011   Procedure: THORACOTOMY OPEN FOR SPINE SURGERY;   Surgeon: Norton Blizzard, MD;  Location: [facility];  Service: Vascular;  Laterality: N/A;  . THORACOTOMY  07/16/2012   Procedure: THORACOTOMY OPEN FOR SPINE SURGERY;  Surgeon: Alleen Borne, MD;  Location: [facility];  Service: Thoracic;  Laterality: N/A;    There were no vitals filed for this visit.    Subjective Assessment - 01/18/21 0950    Subjective Pt is a 40 y.o. male referred to O[facility]T for radicular LBP. Pt reports difficulty with balance, pain, and limited standing, walking and sitting without an increase in his LBP. RLE radicular pain > LLE radicular pain. Pt's goal is to improve mobility and pain so pt can take care, play, and hold his daughter safely.    Pertinent History Pt is a 40 y.o. male referred to PT for LBP, low back ROM, and strengthening. 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 Does report radicular pain , morbid obesity, HTN. Pt reports symptoms since 2010. LBP is described as throbbing and tight and shocks going down BLE's R>L. Goes down post LE to plantar surface of foot, and general dorsum of foot. Pain provoced with sititng prolonged time, sitting, walking. Medicine improves pain. Laying on your back makes pain feel better. reports decreased sensation in radicular pain and does report full numbness which relies on him using walker. Does report  increased LBP and BLE pain with going to have bowel movement. Denies saddle anesthesia, reports intermittent blood in stool denies tarry stools but does have bright red blood in stools when constipated. Worst pain described at 9/10 NPS, best pain reported at 5-6/10 NPS, currently pain recorded at 7/10 NPS. Central LBP. Reports needing to lean forward in community on shopping carts and has back relief with leaning forward and worsening pain with standing  upright. Big goal is to improve to feel safer to play and hold with daughter.    Limitations Sitting;House hold activities;Lifting;Standing;Walking    How long can you sit comfortably? 1 hour    How long can you stand comfortably? 40-60 min    How long can you walk comfortably? 2-3 min    Patient Stated Goals Improve pain, ability to hold daughter safely.    Currently in Pain? Yes    Pain Score 7     Pain Location Back    Pain Orientation Medial    Pain Descriptors / Indicators Aching;Sharp;Tingling;Squeezing;Dull;Numbness    Pain Type Chronic pain    Pain Radiating Towards BLE's    Pain Onset More than a month ago    Pain Frequency Intermittent    Aggravating Factors  Prolonged standing, walking, sitting, forward bending, lumbar extension.    Pain Relieving Factors Medication              [facility] PT Assessment - 01/18/21 0951      Assessment   Medical Diagnosis LBP    Referring Provider (PT) Faith RogueSwartz, Zachary    Onset Date/Surgical Date --   2010   Hand Dominance Right    Next MD Visit March 29, 2021    Prior Therapy Yes      Precautions   Precautions None      Balance Screen   Has the patient fallen in the past 6 months Yes    How many times? 4    Has the patient had a decrease in activity level because of a fear of falling?  Yes    Is the patient reluctant to leave their home because of a fear of falling?  Yes      Home Environment   Living Environment Private residence    Living Arrangements Other relatives    Type of Home Apartment    Home Access Stairs to enter    Home Layout One level    Home Equipment [place] - single point;Walker - 4 wheels      Prior Function   Level of Independence Independent    Vocation On disability    Leisure Play video games      Cognition   Overall Cognitive Status Within Functional Limits for tasks assessed         OBJECTIVE  Mental Status Patient's fund of knowledge is within normal limits for educational  level.  SENSATION: Grossly intact to light touch bilateral LEs as determined by testing dermatomes except decreased sensation to LT at L2 and L5. Proprioception and hot/cold testing deferred on this date   MUSCULOSKELETAL: Tremor: None Bulk: Normal Tone: Normal No visible step-off along spinal column  Posture Slumped, forward head posture.  Gait Step-to 3 point gait with SPC in RUE.   Palpation TTP along spinous processes T7-T9 and L1-L2   Strength (out of 5) R/L 3+/3+ Hip flexion 3+/3+ Hip abduction  5/5 Hip adduction seated 3+/3+ Hip extension 5/5 Knee extension 5/5 Knee flexion 5/5 Ankle dorsiflexion 4/5 Great toe extension 5/5 Ankle  plantarflexion  *Indicates pain   AROM (degrees) R/L (all movements include overpressure unless otherwise stated) Lumbar forward flexion (65): 50% Limited and painful Lumbar extension (30): 25% Limited and painful Lumbar lateral flexion (25): R: Limited  L: Limited and painful Thoracic and Lumbar rotation (30 degrees):  R: WNL L: WNL and painful Hip Flexion (0-125): < 90 deg before pain *Indicates pain  Special tests  Babinski NEGATIVE  Ankle Clonus NEGATIVE  Reflexes B knees 1+  Repeated Movements No centralization or peripheralization of symptoms with repeated lumbar extension or flexion.    Muscle Length Ober: Negative   Passive Accessory Intervertebral Motion (PAIVM) Deferred due to hx of multiple lumbar and thoracic operations.    SPECIAL TESTS Lumbar Radiculopathy and Discogenic: Centralization and Peripheralization (SN 92, -LR 0.12): NEGATIVE Slump (SN 83, -LR 0.32): R: POSITIVE  L: POSITIVE  Crossed SLR (SP 90): R: POSITIVE L: POSITIVE   Hip: Painful at end range flexion   Functional Tasks  Squatting: Limited depth and hip hinge. Unsteady. Pain in B knees.   Sit to stand: Requires UE support.     Balance:  Feet together eyes open: 20 sec. Noted Ant to post sway requiring PT  SBA.              Objective measurements completed on examination: See above findings.    PT Education - 01/18/21 1135    Education Details POC. HEP. Contact referring provider due to reports of bright red blood in stool intermittently.    Person(s) Educated Patient    Methods Explanation;Demonstration;Handout;Verbal cues;Tactile cues    Comprehension Verbalized understanding;Returned demonstration            PT Short Term Goals - 01/18/21 1211      PT SHORT TERM GOAL #1   Title Pt will be independent with HEP to improve functional mobility.    Baseline 6/8: Initiated    Time 5    Period Weeks    Status New    Target Date 02/22/21             PT Long Term Goals - 01/18/21 1212      PT LONG TERM GOAL #1   Title Pt will improve Berg balance by > 5 points to demonstrate decreased risk of falls.    Baseline 6/8: perform next session.    Time 10    Period Weeks    Status New    Target Date 03/29/21      PT LONG TERM GOAL #2   Title Pt will improve FOTO to target score to display improvements in functional mobility.    Baseline 6/8: 40/51    Time 10    Period Weeks    Status New    Target Date 03/29/21      PT LONG TERM GOAL #3   Title Pt will be able to complete to display improvement in household and community ambulation ADL's.    Baseline 6/8: will complete next session. Can only walk 2-3 min per subjective reports.    Time 10    Period Weeks    Status New    Target Date 03/29/21      PT LONG TERM GOAL #4   Title Pt will complete 5xSTS in 10 sec or less to demonstrate BLE strength improvements and reduced risk of falls.    Baseline 6/8: Next session.    Time 10    Period Weeks    Status New    Target  Date 03/29/21      PT LONG TERM GOAL #5   Title Pt will improve global hip MMT to 4/5 to demonstate improved LE strength for standing tasks.    Baseline 6/8: 3+/5 globally.    Time 10    Period Weeks    Status New    Target Date  03/29/21                  Plan - 01/18/21 1155    Clinical Impression Statement Pt is a 40 y.o. male referred to PT for radicular LBP with prior hx of multiple lumbar and thoracic operations. Pt presents with painful and limited lumbar AROM and hip flexion motion. Decreased sensation to light touch at L2 and L5 dermatomes. Pt also presents with global LE weakness tested via MMT and decreased balance standing feet together with AP sway with reports of history of falls.Marland Kitchen + slump and crossed SLR bilaterally and pain relief with repeated supine lumbar flexion. - babinski and clonus bilaterally but +1 reflexes appreciated at L2-L3. Pt ambulates with antalgic gait, step to pattern with SPC on R side and decreased cadence. These impairments are causing pt to experience falls, decreassed ability to perform household and community walking ADL's, and care for daughter. Pt will benefit from skilled PT services to address these impairments so pt can maximize capacity for functional mobility.    Personal Factors and Comorbidities Age;Time since onset of injury/illness/exacerbation;Comorbidity 3+;Past/Current Experience;Fitness    Comorbidities x of thoracic myelopathy with sensory deficits from trunk down with sensory T8 level, hx of chronic LBP with MRI results of L4-L5 facet arthropathy, and L5-S1 spinal stenosis, L4-L5 disc extrusion. Does report radicular pain , morbid obesity, HTN.    Examination-Activity Limitations Squat;Bed Mobility;Stairs;Locomotion Level;Stand;Caring for Others;Sit;Transfers    Examination-Participation Restrictions Shop;Community Activity    Stability/Clinical Decision Making Evolving/Moderate complexity    Clinical Decision Making Moderate    Rehab Potential Fair    PT Frequency 2x / week    PT Duration Other (comment)   10   PT Treatment/Interventions ADLs/Self Care Home Management;Cryotherapy;Moist Heat;Gait training;Functional mobility training;Electrical Stimulation;Aquatic  Therapy;Therapeutic activities;Therapeutic exercise;Balance training;Neuromuscular re-education;Patient/family education;Manual techniques;Dry needling;DME Instruction    PT Next Visit Plan Berg, 2 MWT, 5xSTS, 10 m gait speed.    PT Home Exercise Plan Access Code [email or web address]    Consulted and Agree with Plan of Care Patient           Patient will benefit from skilled therapeutic intervention in order to improve the following deficits and impairments:  Abnormal gait,Decreased knowledge of use of DME,Impaired sensation,Pain,Improper body mechanics,Decreased mobility,Postural dysfunction,Decreased activity tolerance,Decreased range of motion,Decreased strength,Decreased balance,Difficulty walking,Impaired flexibility,Obesity  Visit Diagnosis: Abnormality of gait and mobility  Unsteadiness on feet  Difficulty in walking, not elsewhere classified  Muscle weakness (generalized)     Problem List Patient Active Problem List   Diagnosis Date Noted  . Lumbar facet arthropathy 01/11/2021  . Thoracic spondylosis with myelopathy 01/11/2021  . 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 Stenosis, spinal, thoracic 12/15/2014  . 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 Fairly IV, PT, DPT Physical Therapist- [place]  [facility]  01/18/2021, 12:23 PM  [facility] SP[facility]TS [facility]ICINE [address] [place], [address] Phone: [phone]   Fax:  [phone]  Name: TADARRIUS BURCH MRN: [id number] Date of Birth: 28-May-1981

## 2021-01-23 ENCOUNTER — Ambulatory Visit: Payer: Medicare Other | Admitting: Physical Therapy

## 2021-01-23 ENCOUNTER — Other Ambulatory Visit: Payer: Self-pay

## 2021-01-23 ENCOUNTER — Encounter: Payer: Self-pay | Admitting: Physical Therapy

## 2021-01-23 DIAGNOSIS — R269 Unspecified abnormalities of gait and mobility: Secondary | ICD-10-CM

## 2021-01-23 DIAGNOSIS — R2681 Unsteadiness on feet: Secondary | ICD-10-CM

## 2021-01-23 DIAGNOSIS — R262 Difficulty in walking, not elsewhere classified: Secondary | ICD-10-CM

## 2021-01-23 DIAGNOSIS — M6281 Muscle weakness (generalized): Secondary | ICD-10-CM

## 2021-01-23 NOTE — Therapy (Signed)
[place] [facility] [address] [place], [address] Phone: [phone]   Fax:  [phone]  Physical Therapy Treatment  Patient Details  Name: Travis Fitzpatrick MRN: [id number] Date of Birth: April 30, 1981 Referring Provider (PT): Faith Rogue   Encounter Date: 01/23/2021   PT End of Session - 01/23/21 0917     Visit Number 2    Number of Visits 21    Date for PT Re-Evaluation 03/29/21    PT Start Time 0800    PT Stop Time 0845    PT Time Calculation (min) 45 min    Equipment Utilized During Treatment Gait belt    Activity Tolerance Patient tolerated treatment well;Patient limited by pain    Behavior During Therapy [facility] for tasks assessed/performed             Past Medical History:  Diagnosis Date   Bowel trouble    urgency   Medical history non-contributory    Urinary urgency     Past Surgical History:  Procedure Laterality Date   BACK SURGERY  2010   CIRCUMCISION     LUMBAR LAMINECTOMY/DECOMPRESSION MICRODISCECTOMY  07/20/2011   Procedure: LUMBAR LAMINECTOMY/DECOMPRESSION MICRODISCECTOMY;  Surgeon: Carmela Hurt;  Location: [facility];  Service: Neurosurgery;  Laterality: N/A;  right thoracotomy with thoracic eight-nine discectomy and fusion   THORACIC DISCECTOMY  07/16/2012   Procedure: THORACIC DISCECTOMY;  Surgeon: Carmela Hurt, MD;  Location: [facility];  Service: Neurosurgery;  Laterality: Right;  RIGHT Thoracic seven-eight  thoracic diskectomy via thoracotomy by dr Laneta Simmers   THORACIC DISCECTOMY N/A 12/15/2014   Procedure: THORACIC SEVEN TO THORACIC NINE Laminectomy ;  Surgeon: Coletta Memos, MD;  Location: [facility];  Service: Neurosurgery;  Laterality: N/A;   THORACIC DISCECTOMY N/A 06/06/2017   Procedure: LAMINECTOMY THORACIC NINE-TEN;  Surgeon: Coletta Memos, MD;  Location: [facility];  Service: Neurosurgery;  Laterality: N/A;  LAMINECTOMY THORACIC NINE-TEN   THORACOTOMY  07/20/2011    Procedure: THORACOTOMY OPEN FOR SPINE SURGERY;  Surgeon: Norton Blizzard, MD;  Location: [facility];  Service: Vascular;  Laterality: N/A;   THORACOTOMY  07/16/2012   Procedure: THORACOTOMY OPEN FOR SPINE SURGERY;  Surgeon: Alleen Borne, MD;  Location: [facility];  Service: Thoracic;  Laterality: N/A;    There were no vitals filed for this visit.   Subjective Assessment - 01/23/21 0805     Subjective Pt states that he has been doing exercises and taking medications and that he feels movement has been easier. He has been using rollator too and that he still feels same with pain.    Pertinent History Pt is a 40 y.o. male referred to PT for LBP, low back ROM, and strengthening. 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 Does report radicular pain , morbid obesity, HTN. Pt reports symptoms since 2010. LBP is described as throbbing and tight and shocks going down BLE's R>L. Goes down post LE to plantar surface of foot, and general dorsum of foot. Pain provoced with sititng prolonged time, sitting, walking. Medicine improves pain. Laying on your back makes pain feel better. reports decreased sensation in radicular pain and does report full numbness which relies on him using walker. Does report increased LBP and BLE pain with going to have bowel movement.  Denies saddle anesthesia, reports intermittent blood in stool denies tarry stools but does have bright red blood in stools when constipated. Worst pain described at 9/10 NPS, best pain reported at 5-6/10 NPS, currently pain recorded at 7/10 NPS. Central LBP. Reports needing to lean forward in community on shopping carts and has back relief with leaning forward and worsening pain with standing upright. Big goal is to improve to feel safer to play and hold with daughter.     Limitations Sitting;House hold activities;Lifting;Standing;Walking    How long can you sit comfortably? 1 hour    How long can you stand comfortably? 40-60 min    How long can you walk comfortably? 2-3 min    Patient Stated Goals Improve pain, ability to hold daughter safely.    Currently in Pain? Yes    Pain Score 6     Pain Location Back    Pain Orientation Medial    Pain Descriptors / Indicators Stabbing    Pain Type Acute pain    Pain Radiating Towards Right foot    Pain Onset More than a month ago    Pain Frequency Constant    Aggravating Factors  No aggravating factors, constant    Pain Relieving Factors Medicines and exercise    Multiple Pain Sites No            PHYSICAL PERFORMANCE MEASURES:  Vitals Prior:   BP: 116/72  HR: 82  SPO2: 100  distance: 864        Post Vitals:           BP: 118/73           HR:  84           SPO2: 100            5xSTS: 60 sec      [facility] PT Assessment - 01/23/21 0900       Balance   Balance Assessed Yes      Standardized Balance Assessment   Standardized Balance Assessment Berg Balance Test      Berg Balance Test   Sit to Stand Able to stand using hands after several tries    Standing Unsupported Able to stand 2 minutes with supervision    Sitting with Back Unsupported but Feet Supported on Floor or Stool Able to sit safely and securely 2 minutes    Stand to Sit Controls descent by using hands    Transfers Able to transfer with verbal cueing and /or supervision    Standing Unsupported with Eyes Closed Able to stand 10 seconds with supervision    Standing Unsupported with Feet Together Able to place feet together independently and stand for 1 minute with supervision    From Standing, Reach Forward with Outstretched Arm Can reach forward >5 cm safely (2")    From Standing Position, Pick up Object from Floor Able to pick up shoe, needs supervision    From Standing Position, Turn to Look Behind Over each Shoulder Looks  behind one side only/other side shows less weight shift    Turn 360 Degrees Able to turn 360 degrees safely one side only in 4 seconds or less    Standing Unsupported, Alternately Place Feet on Step/Stool Able to complete 4 steps without aid or supervision    Standing Unsupported, One Foot in Front Needs help to step but can hold 15 seconds    Standing on One Leg Tries to lift leg/unable to hold 3 seconds but  remains standing independently    Total Score 35    Berg comment: Difficulty with lifting left leg and tends to lose balance backwards             THEREX:  Sit to Stand with bilateral UE support 1 x 5  -Vcs to increase speed while standing and slow descent   Romberg with horizontal head turns 2 x 15  -Vcs to slow head movement   Romberg with vertical head turns 2 x 15       PT Education - 01/23/21 0916     Education Details Pt provided with verbal and tactile cues on performance of exercises and to continue to use rollator to increase safety with walking.    Person(s) Educated Patient    Methods Explanation;Verbal cues;Demonstration;Handout    Comprehension Returned demonstration;Verbalized understanding;Verbal cues required              PT Short Term Goals - 01/23/21 0926       PT SHORT TERM GOAL #1   Title Pt will be independent with HEP to improve functional mobility.    Baseline 6/8: Initiated    Time 5    Period Weeks    Status New    Target Date 02/22/21               PT Long Term Goals - 01/23/21 0926       PT LONG TERM GOAL #1   Title Pt will improve Berg balance by > 5 points to demonstrate decreased risk of falls.    Baseline 6/8: 35/56    Time 10    Period Weeks    Status New      PT LONG TERM GOAL #2   Title Pt will improve FOTO to target score to display improvements in functional mobility.    Baseline 6/8: 40/51    Time 10    Period Weeks    Status New      PT LONG TERM GOAL #3   Title Pt will be able to ambulate >=1000 ft  for 6MWT with <=2 rest breaks in order to walk community level distances to care for his daughter.    Baseline 6/13: 864 ft    Time 10    Period Weeks    Status Achieved      PT LONG TERM GOAL #4   Title Pt will complete 5xSTS in 10 sec or less to demonstrate BLE strength improvements and reduced risk of falls.    Baseline 6/13: 60 sec    Time 10    Period Weeks    Status New      PT LONG TERM GOAL #5   Title Pt will improve global hip MMT to 4/5 to demonstate improved LE strength for standing tasks.    Baseline 6/8: 3+/5 globally.    Time 10    Period Weeks    Status New                Patient will benefit from skilled therapeutic intervention in order to improve the following deficits and impairments:  Abnormal gait, Decreased knowledge of use of DME, Impaired sensation, Pain, Improper body mechanics, Decreased mobility, Postural dysfunction, Decreased activity tolerance, Decreased range of motion, Decreased strength, Decreased balance, Difficulty walking, Impaired flexibility, Obesity  Visit Diagnosis: Abnormality of gait and mobility  Muscle weakness (generalized)  Unsteadiness on feet  Difficulty in walking, not elsewhere classified     Problem List Patient Active Problem List  Diagnosis Date Noted   Lumbar facet arthropathy 01/11/2021   Thoracic spondylosis with myelopathy 01/11/2021   Thoracic spinal stenosis 06/06/2017   Stenosis, spinal, thoracic 12/15/2014   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 [place], [address] Phone: [phone]   Fax:  [phone]  Name: JEB SCHLOEMER MRN: [id number] Date of Birth: 04-01-1981

## 2021-01-25 ENCOUNTER — Ambulatory Visit: Payer: Medicare Other | Admitting: Physical Therapy

## 2021-01-25 DIAGNOSIS — M6281 Muscle weakness (generalized): Secondary | ICD-10-CM

## 2021-01-25 DIAGNOSIS — R269 Unspecified abnormalities of gait and mobility: Secondary | ICD-10-CM

## 2021-01-25 DIAGNOSIS — R262 Difficulty in walking, not elsewhere classified: Secondary | ICD-10-CM

## 2021-01-25 DIAGNOSIS — R2681 Unsteadiness on feet: Secondary | ICD-10-CM

## 2021-01-25 NOTE — Therapy (Signed)
[place] [address], [place], [address] Phone: [phone]   Fax:  [phone]  Physical Therapy Treatment  Patient Details  Name: Travis Fitzpatrick MRN: [id number] Date of Birth: 06/23/1981 Referring Provider (PT): Faith Rogue   Encounter Date: 01/25/2021   PT End of Session - 01/25/21 0853     Visit Number 3    Number of Visits 21    Date for PT Re-Evaluation 03/29/21    PT Start Time 0845    PT Stop Time 0930    PT Time Calculation (min) 45 min    Equipment Utilized During Treatment Gait belt    Activity Tolerance Patient tolerated treatment well;Patient limited by pain    Behavior During Therapy [facility] for tasks assessed/performed             Past Medical History:  Diagnosis Date   Bowel trouble    urgency   Medical history non-contributory    Urinary urgency     Past Surgical History:  Procedure Laterality Date   BACK SURGERY  2010   CIRCUMCISION     LUMBAR LAMINECTOMY/DECOMPRESSION MICRODISCECTOMY  07/20/2011   Procedure: LUMBAR LAMINECTOMY/DECOMPRESSION MICRODISCECTOMY;  Surgeon: Carmela Hurt;  Location: [facility];  Service: Neurosurgery;  Laterality: N/A;  right thoracotomy with thoracic eight-nine discectomy and fusion   THORACIC DISCECTOMY  07/16/2012   Procedure: THORACIC DISCECTOMY;  Surgeon: Carmela Hurt, MD;  Location: [facility];  Service: Neurosurgery;  Laterality: Right;  RIGHT Thoracic seven-eight  thoracic diskectomy via thoracotomy by dr Laneta Simmers   THORACIC DISCECTOMY N/A 12/15/2014   Procedure: THORACIC SEVEN TO THORACIC NINE Laminectomy ;  Surgeon: Coletta Memos, MD;  Location: [facility];  Service: Neurosurgery;  Laterality: N/A;   THORACIC DISCECTOMY N/A 06/06/2017   Procedure: LAMINECTOMY THORACIC NINE-TEN;  Surgeon: Coletta Memos, MD;  Location: [facility];  Service: Neurosurgery;  Laterality: N/A;  LAMINECTOMY THORACIC NINE-TEN   THORACOTOMY  07/20/2011    Procedure: THORACOTOMY OPEN FOR SPINE SURGERY;  Surgeon: Norton Blizzard, MD;  [facility]: [facility];  Service: Vascular;  Laterality: N/A;   THORACOTOMY  07/16/2012   Procedure: THORACOTOMY OPEN FOR SPINE SURGERY;  Surgeon: Alleen Borne, MD;  Location: [facility];  Service: Thoracic;  Laterality: N/A;    There were no vitals filed for this visit.   Subjective Assessment - 01/25/21 0846     Subjective Pt states that he is feeling sore from his home exercises and activity. He reports that exercises are going well.    Pertinent History Pt is a 40 y.o. male referred to PT for LBP, low back ROM, and strengthening. 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 Does report radicular pain , morbid obesity, HTN. Pt reports symptoms since 2010. LBP is described as throbbing and tight and shocks going down BLE's R>L. Goes down post LE to plantar surface of foot, and general dorsum of foot. Pain provoced with sititng prolonged time, sitting, walking. Medicine improves pain. Laying on your back makes pain feel better. reports decreased sensation in radicular pain and does report full numbness which relies on him using walker. Does report increased LBP and BLE pain with going to have bowel movement. Denies saddle anesthesia, reports intermittent blood in stool denies tarry stools but does  have bright red blood in stools when constipated. Worst pain described at 9/10 NPS, best pain reported at 5-6/10 NPS, currently pain recorded at 7/10 NPS. Central LBP. Reports needing to lean forward in community on shopping carts and has back relief with leaning forward and worsening pain with standing upright. Big goal is to improve to feel safer to play and hold with daughter.    Limitations Sitting;House hold activities;Lifting;Standing;Walking     How long can you sit comfortably? 1 hour    How long can you stand comfortably? 40-60 min    How long can you walk comfortably? 2-3 min    Patient Stated Goals Improve pain, ability to hold daughter safely.    Currently in Pain? Yes    Pain Score 7     Pain Location Back    Pain Orientation Lower    Pain Type Chronic pain    Pain Radiating Towards Toward legs    Pain Onset More than a month ago            MANUAL THERAPY:  Supine bilateral HS stretch  -Pt reports pain with hip flexion past 30 degrees    THEREX:   Lower Trunk Rotations 1x10 with 3 sec holds  -Vcs to only go within pain free motion   Seated HS Stretch 4 x 30 sec  -Educated on where he should feel stretch  Seated Slump Nerve Glide 1x10  - Vcs to sequence steps in exercise   Supine Straight Leg Raise 2x5 -Vcs to stop hip external rotation on RLE   Standing Marches 2x10  -Vcs to keep toes pointed towards wall   Seated External Rotation Stretch of RLE 4 x 15 sec  -Vcs to apply pressure through Knee      PT Education - 01/25/21 1202     Education Details Pt instructed on how to correctly do exercises.    Person(s) Educated Patient    Methods Explanation;Demonstration;Handout;Tactile cues;Verbal cues    Comprehension Verbalized understanding;Tactile cues required;Verbal cues required              PT Short Term Goals - 01/25/21 0859       PT SHORT TERM GOAL #1   Title Pt will be independent with HEP to improve functional mobility.    Baseline 6/8: Initiated    Time 5    Period Weeks    Status New    Target Date 02/22/21               PT Long Term Goals - 01/25/21 0859       PT LONG TERM GOAL #1   Title Pt will improve Berg balance by > 5 points to demonstrate decreased risk of falls.    Baseline 6/8: 35/56    Time 10    Period Weeks    Status New      PT LONG TERM GOAL #2   Title Pt will improve FOTO to target score to display improvements in functional mobility.     Baseline 6/8: 40/51    Time 10    Period Weeks    Status New      PT LONG TERM GOAL #3   Title Pt will be able to ambulate >=1000 ft for with <=2 rest breaks in order to walk community level distances to care for his daughter.    Baseline 6/13: 864 ft    Time 10    Period Weeks    Status Achieved  PT LONG TERM GOAL #4   Title Pt will complete 5xSTS in 10 sec or less to demonstrate BLE strength improvements and reduced risk of falls.    Baseline 6/13: 60 sec    Time 10    Period Weeks    Status New      PT LONG TERM GOAL #5   Title Pt will improve global hip MMT to 4/5 to demonstate improved LE strength for standing tasks.    Baseline 6/8: 3+/5 globally.    Time 10    Period Weeks    Status New                Plan - 01/25/21 4967     Clinical Impression Statement Pt presented to session with increased soreness and stiffness from completing home exercise plan. As a result, balance exercises were deferred to next session.  Pt has been consistent in following HEP, but he did require review of certain exercises. During session, pt demonstrated significant difficult with hip flexion on LLE with compensation with hip ER. Compensation did improve with seated hip ER stretch and with performance of hip flexion in standing rather than supine. Pt will continue to benefit from PT to progress hip strength, hip mobility, and static and dynamic balance to better be able to care for his daughter and to decrease his risk of falling.    Personal Factors and Comorbidities Age;Time since onset of injury/illness/exacerbation;Comorbidity 3+;Past/Current Experience;Fitness    Comorbidities x of thoracic myelopathy with sensory deficits from trunk down with sensory T8 level, hx of chronic LBP with MRI results of L4-L5 facet arthropathy, and L5-S1 spinal stenosis, L4-L5 disc extrusion. Does report radicular pain , morbid obesity, HTN.    Examination-Activity Limitations Squat;Bed  Mobility;Stairs;Locomotion Level;Stand;Caring for Others;Sit;Transfers    Examination-Participation Restrictions Shop;Community Activity    Stability/Clinical Decision Making Evolving/Moderate complexity    Rehab Potential Fair    PT Frequency 2x / week    PT Duration Other (comment)   10   PT Treatment/Interventions ADLs/Self Care Home Management;Cryotherapy;Moist Heat;Gait training;Functional mobility training;Electrical Stimulation;Aquatic Therapy;Therapeutic activities;Therapeutic exercise;Balance training;Neuromuscular re-education;Patient/family education;Manual techniques;Dry needling;DME Instruction    PT Next Visit Plan Progression of hip strengthening exercises, beginning of static corner balance exercises romberg eyes open and closed and modified SLS    PT Home Exercise Plan Access Code [email or web address]    Consulted and Agree with Plan of Care Patient            HEP includes following exercises:   Access Code: [email or web address] URL: [email or web address] Date: 01/25/2021 Prepared by: Ellin Goodie  Exercises Supine Lower Trunk Rotation - 1 x daily - 7 x weekly - 2 sets - 12 reps Supine Double Knee to Chest - 1 x daily - 7 x weekly - 2 sets - 15 reps Seated Slump Nerve Glide - 1 x daily - 7 x weekly - 2 sets - 10 reps Sit to Stand with Counter Support - 1 x daily - 7 x weekly - 2 sets - 5 reps Seated Hamstring Stretch - 1 x daily - 7 x weekly - 1 sets - 5 reps - 30 hold Seated Hip External Rotation Stretch - 1 x daily - 7 x weekly - 1 sets - 5 reps - 15 hold Standing March with Counter Support - 1 x daily - 7 x weekly - 2 sets - 10 reps    Patient will benefit from skilled therapeutic intervention in order to improve the following deficits and impairments:  Abnormal gait,  Decreased knowledge of use of DME, Impaired sensation, Pain, Improper body mechanics, Decreased mobility, Postural dysfunction, Decreased activity tolerance, Decreased range of motion, Decreased strength,  Decreased balance, Difficulty walking, Impaired flexibility, Obesity  Visit Diagnosis: Abnormality of gait  Muscle weakness (generalized)  Unsteadiness on feet  Difficulty in walking, not elsewhere classified     Problem List Patient Active Problem List   Diagnosis Date Noted   Lumbar facet arthropathy 01/11/2021   Thoracic spondylosis with myelopathy 01/11/2021   Thoracic spinal stenosis 06/06/2017   Stenosis, spinal, thoracic 12/15/2014   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 [place], [place], [id number] Phone: [phone]   Fax:  [phone]  Name: Travis Fitzpatrick MRN: [id number] Date of Birth: 07-Nov-1980

## 2021-02-07 ENCOUNTER — Ambulatory Visit: Payer: Medicare Other | Admitting: Physical Therapy

## 2021-02-07 DIAGNOSIS — R2681 Unsteadiness on feet: Secondary | ICD-10-CM

## 2021-02-07 DIAGNOSIS — R269 Unspecified abnormalities of gait and mobility: Secondary | ICD-10-CM | POA: Diagnosis not present

## 2021-02-07 DIAGNOSIS — R262 Difficulty in walking, not elsewhere classified: Secondary | ICD-10-CM

## 2021-02-07 DIAGNOSIS — M6281 Muscle weakness (generalized): Secondary | ICD-10-CM

## 2021-02-07 NOTE — Therapy (Signed)
[place] [facility] PHYSICAL AND SPORTS MEDICINE [address] [place], [place], [address] Phone: [phone]   Fax:  [phone]  Physical Therapy Treatment  Patient Details  Name: Travis Fitzpatrick MRN: [id number] Date of Birth: 23-Feb-1981 Referring Provider (PT): Faith RogueSwartz, Zachary   Encounter Date: 02/07/2021   PT End of Session - 02/07/21 1003     Visit Number 4    Number of Visits 21    Date for PT Re-Evaluation 03/29/21    PT Start Time 0800    PT Stop Time 0845    PT Time Calculation (min) 45 min    Equipment Utilized During Treatment Gait belt    Activity Tolerance Patient tolerated treatment well;Patient limited by pain    Behavior [facility] for tasks assessed/performed             Past Medical History:  Diagnosis Date   Bowel trouble    urgency   Medical history non-contributory    Urinary urgency     Past Surgical History:  Procedure Laterality Date   BACK SURGERY  2010   CIRCUMCISION     LUMBAR LAMINECTOMY/DECOMPRESSION MICRODISCECTOMY  07/20/2011   Procedure: LUMBAR LAMINECTOMY/DECOMPRESSION MICRODISCECTOMY;  Surgeon: Carmela HurtKyle L Cabbell;  Location: [facility];  Service: Neurosurgery;  Laterality: N/A;  right thoracotomy with thoracic eight-nine discectomy and fusion   THORACIC DISCECTOMY  07/16/2012   Procedure: THORACIC DISCECTOMY;  Surgeon: Carmela HurtKyle L Cabbell, MD;  Location: [facility];  Service: Neurosurgery;  Laterality: Right;  RIGHT Thoracic seven-eight  thoracic diskectomy via thoracotomy by dr Laneta Simmersbartle   THORACIC DISCECTOMY N/A 12/15/2014   Procedure: THORACIC SEVEN TO THORACIC NINE Laminectomy ;  Surgeon: Coletta MemosKyle Cabbell, MD;  Location: [facility];  Service: Neurosurgery;  Laterality: N/A;   THORACIC DISCECTOMY N/A 06/06/2017   Procedure: LAMINECTOMY THORACIC NINE-TEN;  Surgeon: Coletta Memosabbell, Kyle, MD;  Location: [facility];  Service: Neurosurgery;  Laterality: N/A;  LAMINECTOMY THORACIC NINE-TEN   THORACOTOMY  07/20/2011    Procedure: THORACOTOMY OPEN FOR SPINE SURGERY;  Surgeon: Norton Blizzard Patrick Burney, MD;  Location: [facility];  Service: Vascular;  Laterality: N/A;   THORACOTOMY  07/16/2012   Procedure: THORACOTOMY OPEN FOR SPINE SURGERY;  Surgeon: Alleen BorneBryan K Bartle, MD;  Location: [facility];  Service: Thoracic;  Laterality: N/A;    There were no vitals filed for this visit.   Subjective Assessment - 02/07/21 0759     Subjective Pt states that he is feeling sore from his exercises. He reports that he feels like he is getting better such as being able to walk further and bend forward. He states that he is not currently walking much especially when his back is feeling tight and he spend most of his time at home caring for his daughter.    Pertinent History Pt is a 40 y.o. male referred to PT for LBP, low back ROM, and strengthening. 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 Does report radicular pain , morbid obesity, HTN. Pt reports symptoms since 2010. LBP is described as throbbing and tight and shocks going down BLE's R>L. Goes down post LE to plantar surface of foot, and general dorsum of foot. Pain provoced with sititng prolonged time, sitting, walking. Medicine improves pain. Laying on your back makes pain feel better. reports decreased sensation in  radicular pain and does report full numbness which relies on him using walker. Does report increased LBP and BLE pain with going to have bowel movement. Denies saddle anesthesia, reports intermittent blood in stool denies tarry stools but does have bright red blood in stools when constipated. Worst pain described at 9/10 NPS, best pain reported at 5-6/10 NPS, currently pain recorded at 7/10 NPS. Central LBP. Reports needing to lean forward in community on shopping carts and has back relief with  leaning forward and worsening pain with standing upright. Big goal is to improve to feel safer to play and hold with daughter.    Limitations Sitting;House hold activities;Lifting;Standing;Walking    How long can you sit comfortably? 1 hour    How long can you stand comfortably? 40-60 min    How long can you walk comfortably? 2-3 min    Patient Stated Goals Improve pain, ability to hold daughter safely.    Currently in Pain? Yes    Pain Score 5     Pain Location Back    Pain Orientation Lower    Pain Descriptors / Indicators Stabbing    Pain Type Chronic pain    Pain Onset More than a month ago             NMR   Romberg 2 x 30 sec  Romberg with Horizontal Head Turns 1 x 10  Romberg with Vertical Head Turns 1 x 10  Romberg Eyes Closed 30 sec -Vcs to adjust self before attempting   Modified Single Leg Stand on 6 inch step  2 x 30 sec   Toe Taps on 6 inch step 2 x 5   THEREX   Sit to Stand 1 x 12  Standing Marches 2 x 5 with 1 UE support   Trailed stationary bicycle 3 min  -Pt reports pain in right ankle that makes it difficult for him to peddle    Updated HEP and educated patient on changes to exercises and addition of new exercises.  Pt instructed to begin home walking program. 2 min per day, 5 days per week.    PT Education - 02/07/21 0805     Education Details form/technique with exercise    Person(s) Educated Patient    Methods Explanation;Demonstration    Comprehension Verbalized understanding;Verbal cues required              PT Short Term Goals - 02/07/21 0814       PT SHORT TERM GOAL #1   Title Pt will be independent with HEP to improve functional mobility.    Baseline 6/8: Initiated    Time 5    Period Weeks    Status New    Target Date 02/22/21               PT Long Term Goals - 02/07/21 0814       PT LONG TERM GOAL #1   Title Pt will improve Berg balance by > 5 points to demonstrate decreased risk of falls.    Baseline 6/8:  35/56    Time 10    Period Weeks    Status New      PT LONG TERM GOAL #2   Title Pt will improve FOTO to target score to display improvements in functional mobility.    Baseline 6/8: 40/51    Time 10    Period Weeks    Status New      PT LONG TERM GOAL #3   Title  Pt will be able to ambulate >=1000 ft for with <=2 rest breaks in order to walk community level distances to care for his daughter.    Baseline 6/13: 864 ft    Time 10    Period Weeks    Status Achieved      PT LONG TERM GOAL #4   Title Pt will complete 5xSTS in 10 sec or less to demonstrate BLE strength improvements and reduced risk of falls.    Baseline 6/13: 60 sec    Time 10    Period Weeks    Status New      PT LONG TERM GOAL #5   Title Pt will improve global hip MMT to 4/5 to demonstate improved LE strength for standing tasks.    Baseline 6/8: 3+/5 globally.    Time 10    Period Weeks    Status New                   Plan - 02/07/21 2542     Clinical Impression Statement Pt presents for follow up for low back pain and balance impairments. He demonstrates progression of balance and hip strengthening exercises with ability to complete static balance exercises without increased hip sway and ability to completing standing exercises with decrease upper extremity support. Pt required several seated rest breaks during session, because of increase in back pain and tightness. However, this resolved after stopping exercise, and pt did not report an increase in his pain at the end of the session. Pt will continue to benefit from skilled PT to increase hip and core strength as well as his static and dynamic balance to decrease his low back pain and his risk of falling in order to care for his daughter.    Personal Factors and Comorbidities Age;Time since onset of injury/illness/exacerbation;Comorbidity 3+;Past/Current Experience;Fitness    Comorbidities x of thoracic myelopathy with sensory deficits from trunk  down with sensory T8 level, hx of chronic LBP with MRI results of L4-L5 facet arthropathy, and L5-S1 spinal stenosis, L4-L5 disc extrusion. Does report radicular pain , morbid obesity, HTN.    Examination-Activity Limitations Squat;Bed Mobility;Stairs;Locomotion Level;Stand;Caring for Others;Sit;Transfers    Examination-Participation Restrictions Shop;Community Activity    Stability/Clinical Decision Making Evolving/Moderate complexity    Rehab Potential Fair    PT Frequency 2x / week    PT Duration Other (comment)   10   PT Treatment/Interventions ADLs/Self Care Home Management;Cryotherapy;Moist Heat;Gait training;Functional mobility training;Electrical Stimulation;Aquatic Therapy;Therapeutic activities;Therapeutic exercise;Balance training;Neuromuscular re-education;Patient/family education;Manual techniques;Dry needling;DME Instruction    PT Next Visit Plan Begin treadmill exercise, progress hip strengthening exercises, and static corner balance exercises    PT Home Exercise Plan Access Code [email or web address]    Consulted and Agree with Plan of Care Patient             Patient will benefit from skilled therapeutic intervention in order to improve the following deficits and impairments:  Abnormal gait, Decreased knowledge of use of DME, Impaired sensation, Pain, Improper body mechanics, Decreased mobility, Postural dysfunction, Decreased activity tolerance, Decreased range of motion, Decreased strength, Decreased balance, Difficulty walking, Impaired flexibility, Obesity  Visit Diagnosis: Abnormality of gait  Muscle weakness (generalized)  Unsteadiness on feet  Difficulty in walking, not elsewhere classified     Problem List Patient Active Problem List   Diagnosis Date Noted   Lumbar facet arthropathy 01/11/2021   Thoracic spondylosis with myelopathy 01/11/2021   Thoracic spinal stenosis 06/06/2017   Stenosis, spinal, thoracic 12/15/2014   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 [place], [address] Phone: [phone]   Fax:  [phone]  Name: Travis Fitzpatrick MRN: [id number] Date of Birth: 1981-05-18

## 2021-02-09 ENCOUNTER — Other Ambulatory Visit: Payer: Self-pay

## 2021-02-09 ENCOUNTER — Ambulatory Visit: Payer: Medicare Other | Admitting: Physical Therapy

## 2021-02-09 DIAGNOSIS — M6281 Muscle weakness (generalized): Secondary | ICD-10-CM

## 2021-02-09 DIAGNOSIS — R2681 Unsteadiness on feet: Secondary | ICD-10-CM

## 2021-02-09 DIAGNOSIS — R269 Unspecified abnormalities of gait and mobility: Secondary | ICD-10-CM

## 2021-02-09 NOTE — Therapy (Signed)
[facility] MEDICINE [address], [place], [address] Phone: [phone]   Fax:  [phone]  Physical Therapy Treatment  Patient Details  Name: Travis Fitzpatrick MRN: [id number] Date of Birth: 1980/12/14 Referring Provider (PT): Faith Rogue   Encounter Date: 02/09/2021   PT End of Session - 02/09/21 2048     Visit Number 5    Number of Visits 21    Date for PT Re-Evaluation 03/29/21    PT Start Time 0800    PT Stop Time 0845    PT Time Calculation (min) 45 min    Equipment Utilized During Treatment Gait belt    Activity Tolerance Patient tolerated treatment well;Patient limited by pain    Behavior During Therapy [facility] for tasks assessed/performed             Past Medical History:  Diagnosis Date   Bowel trouble    urgency   Medical history non-contributory    Urinary urgency     Past Surgical History:  Procedure Laterality Date   BACK SURGERY  2010   CIRCUMCISION     LUMBAR LAMINECTOMY/DECOMPRESSION MICRODISCECTOMY  07/20/2011   Procedure: LUMBAR LAMINECTOMY/DECOMPRESSION MICRODISCECTOMY;  Surgeon: Carmela Hurt;  Location: [facility];  Service: Neurosurgery;  Laterality: N/A;  right thoracotomy with thoracic eight-nine discectomy and fusion   THORACIC DISCECTOMY  07/16/2012   Procedure: THORACIC DISCECTOMY;  Surgeon: Carmela Hurt, MD;  [facility]: [facility];  Service: Neurosurgery;  Laterality: Right;  RIGHT Thoracic seven-eight  thoracic diskectomy via thoracotomy by dr Laneta Simmers   THORACIC DISCECTOMY N/A 12/15/2014   Procedure: THORACIC SEVEN TO THORACIC NINE Laminectomy ;  Surgeon: Coletta Memos, MD;  Location: [facility];  Service: Neurosurgery;  Laterality: N/A;   THORACIC DISCECTOMY N/A 06/06/2017   Procedure: LAMINECTOMY THORACIC NINE-TEN;  Surgeon: Coletta Memos, MD;  Location: [facility];  Service: Neurosurgery;  Laterality: N/A;  LAMINECTOMY THORACIC NINE-TEN   THORACOTOMY  07/20/2011    Procedure: THORACOTOMY OPEN FOR SPINE SURGERY;  Surgeon: Norton Blizzard, MD;  [facility]: [facility];  Service: Vascular;  Laterality: N/A;   THORACOTOMY  07/16/2012   Procedure: THORACOTOMY OPEN FOR SPINE SURGERY;  Surgeon: Alleen Borne, MD;  Location: [facility];  Service: Thoracic;  Laterality: N/A;    There were no vitals filed for this visit.   Subjective Assessment - 02/09/21 0802     Subjective Pt states that he is tired this morning because his daughter. He is feeling pain in his right ankle from riding bike last session.    Pertinent History Pt is a 40 y.o. male referred to PT for LBP, low back ROM, and strengthening. 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 Does report radicular pain , morbid obesity, HTN. Pt reports symptoms since 2010. LBP is described as throbbing and tight and shocks going down BLE's R>L. Goes down post LE to plantar surface of foot, and general dorsum of foot. Pain provoced with sititng prolonged time, sitting, walking. Medicine improves pain. Laying on your back makes pain feel better. reports decreased sensation in radicular pain and does report full numbness which relies on him using walker. Does report increased LBP and BLE pain with going to have bowel movement. Denies saddle anesthesia, reports intermittent blood in stool denies  tarry stools but does have bright red blood in stools when constipated. Worst pain described at 9/10 NPS, best pain reported at 5-6/10 NPS, currently pain recorded at 7/10 NPS. Central LBP. Reports needing to lean forward in community on shopping carts and has back relief with leaning forward and worsening pain with standing upright. Big goal is to improve to feel safer to play and hold with daughter.    Limitations Sitting;House hold  activities;Lifting;Standing;Walking    How long can you sit comfortably? 1 hour    How long can you stand comfortably? 40-60 min    How long can you walk comfortably? 2-3 min    Patient Stated Goals Improve pain, ability to hold daughter safely.    Pain Score 5     Pain Location Ankle    Pain Orientation Right    Pain Descriptors / Indicators Aching    Pain Onset More than a month ago             NMR:  Romberg Horizontal Head Turns 1 x 10 -Vcs to maintain steady speed with head turns and not to stop in middle.   Romberg Vertical Head Turns 1 x 10   Ankle Strategies 1 x 10 -Vcs to only use ankles and not hips   Hip Strategies 1 x 10    Hip Strategies on Foam 1 x 10   THEREX  TM 1.3 mph for 2 min 50 sec  -Pt self terminated due back tightness and pain   Sit to Stand 1 x 10    Updated HEP and educated patient on changes to exercises and addition of new exercises     Educated on use of RPE scale: -Moderate Activity= cannot sing favorite song=4-6 on RPE scale  -Vigorous Activity-Talk test= cannot say more than a few words out loud= 7-8 on RPE scale      PT Education - 02/09/21 2047     Education Details form/technique with exercise    Person(s) Educated Patient    Methods Explanation;Verbal cues;Handout;Demonstration    Comprehension Verbalized understanding;Verbal cues required;Returned demonstration              PT Short Term Goals - 02/09/21 2052       PT SHORT TERM GOAL #1   Title Pt will be independent with HEP to improve functional mobility.    Baseline 6/8: Initiated    Time 5    Period Weeks    Status New    Target Date 02/22/21               PT Long Term Goals - 02/09/21 2053       PT LONG TERM GOAL #1   Title Pt will improve Berg balance by > 5 points to demonstrate decreased risk of falls.    Baseline 6/8: 35/56    Time 10    Period Weeks    Status New      PT LONG TERM GOAL #2   Title Pt will improve FOTO to target  score to display improvements in functional mobility.    Baseline 6/8: 40/51    Time 10    Period Weeks    Status New      PT LONG TERM GOAL #3   Title Pt will be able to ambulate >=1000 ft for with <=2 rest breaks in order to walk community level distances to care for his daughter.    Baseline 6/13: 864 ft    Time 10  Period Weeks    Status Achieved      PT LONG TERM GOAL #4   Title Pt will complete 5xSTS in 10 sec or less to demonstrate BLE strength improvements and reduced risk of falls.    Baseline 6/13: 60 sec    Time 10    Period Weeks    Status New      PT LONG TERM GOAL #5   Title Pt will improve global hip MMT to 4/5 to demonstate improved LE strength for standing tasks.    Baseline 6/8: 3+/5 globally.    Time 10    Period Weeks    Status New                   Plan - 02/09/21 2051     Clinical Impression Statement Pt presents for follow up for low back pain, generalized weakness, and decreased balance. He demonstrates an improvement in LE strength with increased sit to stand repititions. He exhibits increased balance with ability to complete romberg with head turns. Pt continues to be limited by low back pain that resolves after a seated rest break. Pt will continue to benefit from PT to progress his LE strength and balance in order to decrease his risk of falling in order to safely care for his daughter.    Personal Factors and Comorbidities Age;Time since onset of injury/illness/exacerbation;Comorbidity 3+;Past/Current Experience;Fitness    Comorbidities x of thoracic myelopathy with sensory deficits from trunk down with sensory T8 level, hx of chronic LBP with MRI results of L4-L5 facet arthropathy, and L5-S1 spinal stenosis, L4-L5 disc extrusion. Does report radicular pain , morbid obesity, HTN.    Examination-Activity Limitations Squat;Bed Mobility;Stairs;Locomotion Level;Stand;Caring for Others;Sit;Transfers    Examination-Participation Restrictions  Shop;Community Activity    Stability/Clinical Decision Making Evolving/Moderate complexity    Rehab Potential Fair    PT Frequency 2x / week    PT Duration Other (comment)   10   PT Treatment/Interventions ADLs/Self Care Home Management;Cryotherapy;Moist Heat;Gait training;Functional mobility training;Electrical Stimulation;Aquatic Therapy;Therapeutic activities;Therapeutic exercise;Balance training;Neuromuscular re-education;Patient/family education;Manual techniques;Dry needling;DME Instruction    PT Next Visit Plan Progress LE strengthening exercises by increase repitions and decreasing base of support for single leg exercises. Pt will also complete circuit training by walking or marching in between sets of exercises.    PT Home Exercise Plan Access Code [email or web address]    Consulted and Agree with Plan of Care Patient             Patient will benefit from skilled therapeutic intervention in order to improve the following deficits and impairments:  Abnormal gait, Decreased knowledge of use of DME, Impaired sensation, Pain, Improper body mechanics, Decreased mobility, Postural dysfunction, Decreased activity tolerance, Decreased range of motion, Decreased strength, Decreased balance, Difficulty walking, Impaired flexibility, Obesity  Visit Diagnosis: Abnormality of gait  Muscle weakness (generalized)  Unsteadiness on feet     Problem List Patient Active Problem List   Diagnosis Date Noted   Lumbar facet arthropathy 01/11/2021   Thoracic spondylosis with myelopathy 01/11/2021   Thoracic spinal stenosis 06/06/2017   Stenosis, spinal, thoracic 12/15/2014   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 09/22/2014   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/2011   Ellin Goodie PT, DPT  02/09/2021, 8:58 PM  [facility] PH[facility]TS MEDICINE [address], [place], [address] Phone: [phone]   Fax:   [phone]  Name: Travis Fitzpatrick MRN: [id number] Date of Birth: Jul 29, 1981

## 2021-02-14 ENCOUNTER — Ambulatory Visit: Payer: Medicare Other | Attending: Physical Medicine & Rehabilitation | Admitting: Physical Therapy

## 2021-02-14 ENCOUNTER — Other Ambulatory Visit: Payer: Self-pay

## 2021-02-14 DIAGNOSIS — R262 Difficulty in walking, not elsewhere classified: Secondary | ICD-10-CM | POA: Diagnosis present

## 2021-02-14 DIAGNOSIS — R2681 Unsteadiness on feet: Secondary | ICD-10-CM | POA: Diagnosis present

## 2021-02-14 DIAGNOSIS — M6281 Muscle weakness (generalized): Secondary | ICD-10-CM | POA: Diagnosis present

## 2021-02-14 DIAGNOSIS — R269 Unspecified abnormalities of gait and mobility: Secondary | ICD-10-CM | POA: Diagnosis not present

## 2021-02-14 NOTE — Therapy (Signed)
[facility] [address] [place], [address] Phone: [phone]   Fax:  [phone]  Physical Therapy Treatment  Patient Details  Name: Travis Fitzpatrick MRN: [id number] Date of Birth: [birth year] Referring Provider (PT): Alger Simons   Encounter Date: 02/14/2021    Past Medical History:  Diagnosis Date   Bowel trouble    urgency   Medical history non-contributory    Urinary urgency     Past Surgical History:  Procedure Laterality Date   BACK SURGERY  2010   CIRCUMCISION     LUMBAR LAMINECTOMY/DECOMPRESSION MICRODISCECTOMY  07/20/2011   Procedure: LUMBAR LAMINECTOMY/DECOMPRESSION MICRODISCECTOMY;  Surgeon: Winfield Cunas;  Location: [facility];  Service: Neurosurgery;  Laterality: N/A;  right thoracotomy with thoracic eight-nine discectomy and fusion   THORACIC DISCECTOMY  07/16/2012   Procedure: THORACIC DISCECTOMY;  Surgeon: Winfield Cunas, MD;  Location: [facility];  Service: Neurosurgery;  Laterality: Right;  RIGHT Thoracic seven-eight  thoracic diskectomy via thoracotomy by dr Cyndia Bent   THORACIC DISCECTOMY N/A 12/15/2014   Procedure: THORACIC SEVEN TO THORACIC NINE Laminectomy ;  Surgeon: Ashok Pall, MD;  Location: [facility];  Service: Neurosurgery;  Laterality: N/A;   THORACIC DISCECTOMY N/A 06/06/2017   Procedure: LAMINECTOMY THORACIC NINE-TEN;  Surgeon: Ashok Pall, MD;  Location: [facility];  Service: Neurosurgery;  Laterality: N/A;  LAMINECTOMY THORACIC NINE-TEN   THORACOTOMY  07/20/2011   Procedure: THORACOTOMY OPEN FOR SPINE SURGERY;  Surgeon: Pierre Bali, MD;  Location: [facility];  Service: Vascular;  Laterality: N/A;   THORACOTOMY  07/16/2012   Procedure: THORACOTOMY OPEN FOR SPINE SURGERY;  Surgeon: Gaye Pollack, MD;  Location: [facility];  Service: Thoracic;  Laterality: N/A;    There were no vitals filed for this visit.   Subjective Assessment - 02/14/21 0849     Subjective  Pt reports that his back pain has been feeling better. He notices an increased ability to sit in a car and to walk further. He still is experiencing imbalance.    Pertinent History Pt is a 40 y.o. male referred to PT for LBP, low back ROM, and strengthening. 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 Does report radicular pain , morbid obesity, HTN. Pt reports symptoms since 2010. LBP is described as throbbing and tight and shocks going down BLE's R>L. Goes down post LE to plantar surface of foot, and general dorsum of foot. Pain provoced with sititng prolonged time, sitting, walking. Medicine improves pain. Laying on your back makes pain feel better. reports decreased sensation in radicular pain and does report full numbness which relies on him using walker. Does report increased LBP and BLE pain with going to have bowel movement. Denies saddle anesthesia, reports intermittent blood in stool denies tarry stools but does have bright red blood in stools when constipated. Worst pain described at 9/10 NPS, best pain reported at 5-6/10 NPS, currently pain recorded at 7/10 NPS. Central LBP. Reports needing to lean forward in community on shopping carts and has back relief with leaning forward and worsening pain with standing upright. Big goal is to improve to feel safer to play and hold with daughter.    Limitations Sitting;House hold activities;Lifting;Standing;Walking    How long can you sit comfortably? 1 hour  How long can you stand comfortably? 40-60 min    How long can you walk comfortably? 2-3 min    Patient Stated Goals Improve pain, ability to hold daughter safely.    Currently in Pain? Yes    Pain Score 5     Pain Onset More than a month ago            EVALUATION:    1,000 feet walk task with use of cane -6 min  30 sec   5 x STS -20 sec   Berg 46/56 (See Flow Sheet)   02/14/21 0001  Berg Balance Test  Sit to Stand 4  Standing Unsupported 4  Sitting with Back Unsupported but Feet Supported on Floor or Stool 4  Stand to Sit 4  Transfers 3  Standing Unsupported with Eyes Closed 2  Standing Ubsupported with Feet Together 4  From Standing, Reach Forward with Outstretched Arm 3  From Standing Position, Pick up Object from Floor 4  From Standing Position, Turn to Look Behind Over each Shoulder 4  Turn 360 Degrees 4  Standing Unsupported, Alternately Place Feet on Step/Stool 2  Standing Unsupported, One Foot in Front 3  Standing on One Leg 1  Total Score 46     Hip MMT  Bilateral Abduction 4/5  Bilateral Adduction 3/5  Bilateral Flexion 3+/5  Bilateral Extension 3+/5       PT Short Term Goals - 02/14/21 1008       PT SHORT TERM GOAL #1   Title Pt will be independent with HEP to improve functional mobility.    Baseline 6/8: Initiated  7/5: Met    Time 5    Period Weeks    Status Achieved    Target Date 02/22/21               PT Long Term Goals - 02/14/21 1009       PT LONG TERM GOAL #1   Title Pt will improve Berg balance by > 5 points to demonstrate decreased risk of falls.    Baseline 6/8: 35/56 7/5: 46/56    Time 10    Period Weeks    Status Achieved    Target Date 03/29/21      PT LONG TERM GOAL #2   Title Pt will improve FOTO to target score to display improvements in functional mobility.    Baseline 6/8: 40/51 7/5: 50/51    Time 10    Period Weeks    Status Partially Met    Target Date 03/29/21      PT LONG TERM GOAL #3   Title Pt will be able to ambulate >=1000 ft for 6MWT with <=2 rest breaks in order to walk community level distances to care for his daughter.    Baseline 6/13: 864 ft 7/5: 1000 ft (6 min 30 sec)    Time 10    Period Weeks    Status Achieved    Target Date 03/29/21      PT LONG TERM GOAL #4   Title Pt will complete 5xSTS in 10 sec  or less to demonstrate BLE strength improvements and reduced risk of falls.    Baseline 6/13: 60 sec 7/5: 20 SEC    Time 10    Period Weeks    Status Partially Met      PT LONG TERM GOAL #5   Title Pt will improve global hip MMT to 4/5 to demonstate improved LE strength for standing tasks.  Baseline 6/8: 3+/5 globally 7/5: 4/5 bilateral abduction, 3+/5 bilateral ext, 3+/5 bilateral flexion, 3/5 bilateral adduction.    Time 10    Period Weeks    Status Partially Met    Target Date 03/29/21                 Plan - 02/14/21 1131     Clinical Impression Statement Pt presents for follow up and re-evaluation of goals for low back pain, generalized weakness, and decreased balance. He demonstrates improved balance, endurance, and LE strength that reduces his risk of falling. Pt also exhibits and increased subjective report of his functional abilities as indicated by the increase in his FOTO score. He will continue to benefit from PT to progress LE strength, aerobic endurance, and hip strength in order to improve his balance and to decrease his low back pain to safely care for daughter without increasing his pain or risk of falling.    Personal Factors and Comorbidities Age;Time since onset of injury/illness/exacerbation;Comorbidity 3+;Past/Current Experience;Fitness    Comorbidities x of thoracic myelopathy with sensory deficits from trunk down with sensory T8 level, hx of chronic LBP with MRI results of L4-L5 facet arthropathy, and L5-S1 spinal stenosis, L4-L5 disc extrusion. Does report radicular pain , morbid obesity, HTN.    Examination-Activity Limitations Squat;Bed Mobility;Stairs;Locomotion Level;Stand;Caring for Others;Sit;Transfers    Examination-Participation Restrictions Shop;Community Activity    Stability/Clinical Decision Making Evolving/Moderate complexity    Rehab Potential Fair    PT Frequency 2x / week    PT Duration Other (comment)   10   PT Treatment/Interventions  ADLs/Self Care Home Management;Cryotherapy;Moist Heat;Gait training;Functional mobility training;Electrical Stimulation;Aquatic Therapy;Therapeutic activities;Therapeutic exercise;Balance training;Neuromuscular re-education;Patient/family education;Manual techniques;Dry needling;DME Instruction    PT Next Visit Plan Progress LE strengthening exercises by increase repitions and decreasing base of support for single leg exercises. Pt will also complete circuit training by walking or marching in between sets of exercises.    PT Home Exercise Plan Access Code [email or web address]    Consulted and Agree with Plan of Care Patient             Patient will benefit from skilled therapeutic intervention in order to improve the following deficits and impairments:  Abnormal gait, Decreased knowledge of use of DME, Impaired sensation, Pain, Improper body mechanics, Decreased mobility, Postural dysfunction, Decreased activity tolerance, Decreased range of motion, Decreased strength, Decreased balance, Difficulty walking, Impaired flexibility, Obesity  Visit Diagnosis: Abnormality of gait  Muscle weakness (generalized)  Difficulty in walking, not elsewhere classified     Problem List Patient Active Problem List   Diagnosis Date Noted   Lumbar facet arthropathy 01/11/2021   Thoracic spondylosis with myelopathy 01/11/2021   Thoracic spinal stenosis 06/06/2017   Stenosis, spinal, thoracic 12/15/2014   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 [place], [address] Phone: [phone]   Fax:  [phone]  Name: ELUTERIO SEYMOUR MRN: [id number] Date of Birth: [birth year]

## 2021-02-20 ENCOUNTER — Ambulatory Visit: Payer: Medicare Other | Admitting: Physical Therapy

## 2021-02-20 DIAGNOSIS — R269 Unspecified abnormalities of gait and mobility: Secondary | ICD-10-CM | POA: Diagnosis not present

## 2021-02-20 DIAGNOSIS — R262 Difficulty in walking, not elsewhere classified: Secondary | ICD-10-CM

## 2021-02-20 DIAGNOSIS — M6281 Muscle weakness (generalized): Secondary | ICD-10-CM

## 2021-02-20 NOTE — Therapy (Signed)
[facility] [address], [place], [address] Phone: [phone]   Fax:  [phone]  Physical Therapy Treatment  Patient Details  Name: Travis Fitzpatrick MRN: [id number] Date of Birth: 1981-01-31 Referring Provider (PT): Alger Simons   Encounter Date: 02/20/2021   PT End of Session - 02/20/21 1033     Visit Number 7    Number of Visits 21    Date for PT Re-Evaluation 03/29/21    PT Start Time 0845    PT Stop Time 0930    PT Time Calculation (min) 45 min    Equipment Utilized During Treatment Gait belt    Activity Tolerance Patient tolerated treatment well;Patient limited by pain    Behavior During Therapy [facility] for tasks assessed/performed             Past Medical History:  Diagnosis Date   Bowel trouble    urgency   Medical history non-contributory    Urinary urgency     Past Surgical History:  Procedure Laterality Date   BACK SURGERY  2010   CIRCUMCISION     LUMBAR LAMINECTOMY/DECOMPRESSION MICRODISCECTOMY  07/20/2011   Procedure: LUMBAR LAMINECTOMY/DECOMPRESSION MICRODISCECTOMY;  Surgeon: Winfield Cunas;  Location: [facility];  Service: Neurosurgery;  Laterality: N/A;  right thoracotomy with thoracic eight-nine discectomy and fusion   THORACIC DISCECTOMY  07/16/2012   Procedure: THORACIC DISCECTOMY;  Surgeon: Winfield Cunas, MD;  Location: [facility];  Service: Neurosurgery;  Laterality: Right;  RIGHT Thoracic seven-eight  thoracic diskectomy via thoracotomy by dr Cyndia Bent   THORACIC DISCECTOMY N/A 12/15/2014   Procedure: THORACIC SEVEN TO THORACIC NINE Laminectomy ;  Surgeon: Ashok Pall, MD;  Location: [facility];  Service: Neurosurgery;  Laterality: N/A;   THORACIC DISCECTOMY N/A 06/06/2017   Procedure: LAMINECTOMY THORACIC NINE-TEN;  Surgeon: Ashok Pall, MD;  Location: [facility];  Service: Neurosurgery;  Laterality: N/A;  LAMINECTOMY THORACIC NINE-TEN   THORACOTOMY  07/20/2011    Procedure: THORACOTOMY OPEN FOR SPINE SURGERY;  Surgeon: Pierre Bali, MD;  Location: [facility];  Service: Vascular;  Laterality: N/A;   THORACOTOMY  07/16/2012   Procedure: THORACOTOMY OPEN FOR SPINE SURGERY;  Surgeon: Gaye Pollack, MD;  Location: [facility];  Service: Thoracic;  Laterality: N/A;    There were no vitals filed for this visit.   Subjective Assessment - 02/20/21 0850     Subjective Pt reports an improvement in his function with ability to walk longer distances and get up from surfaces. He is still experiencing low back pain and it has not changed much since beginning of PT.    Pertinent History Pt is a 40 y.o. male referred to PT for LBP, low back ROM, and strengthening. 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 Does report radicular pain , morbid obesity, HTN. Pt reports symptoms since 2010. LBP is described as throbbing and tight and shocks going down BLE's R>L. Goes down post LE to plantar surface of foot, and general dorsum of foot. Pain provoced with sititng prolonged time, sitting, walking. Medicine improves pain. Laying on your back makes pain feel better. reports decreased sensation in radicular pain and does report full numbness which relies on him using walker. Does report increased LBP and BLE pain with going to have  bowel movement. Denies saddle anesthesia, reports intermittent blood in stool denies tarry stools but does have bright red blood in stools when constipated. Worst pain described at 9/10 NPS, best pain reported at 5-6/10 NPS, currently pain recorded at 7/10 NPS. Central LBP. Reports needing to lean forward in community on shopping carts and has back relief with leaning forward and worsening pain with standing upright. Big goal is to improve to feel safer to play and hold with  daughter.    Limitations Sitting;House hold activities;Lifting;Standing;Walking    How long can you sit comfortably? 1 hour    How long can you stand comfortably? 40-60 min    How long can you walk comfortably? 2-3 min    Patient Stated Goals Improve pain, ability to hold daughter safely.    Currently in Pain? Yes    Pain Score 5     Pain Location Back    Pain Orientation Right    Pain Descriptors / Indicators Aching    Pain Type Chronic pain    Pain Onset More than a month ago            THEREX:  Lower Back [employer] 3 x 10 Middle, Right, Left  Standing Marches with BUE support 1 x 10  -Vcs to stop ER hip  Standing Marches with 1 UE to 2 UE support 2 x 15 -Vcs to prevent knee from externally rotating  -Vcs to use both UE for support   NMR:  Corner Balance Routine:  Romberg with eyes open 30 sec  Romberg Hor Head Turns 1 x 10  Romberg Vert Head Turns 1 x 10  Romberg Eyes Closed 3 x 30 sec  -Increased use of hip strategies >ankle strategies   Romberg Eyes Open Chest Press 1 x 10 #20 Romberg Eyes Open Chest Press Front, Right, and Left 1 x 10 #20  Romberg Eyes Open   Forward and Backward Leans 1 x 10 -Vcs to bend at hips    Updated HEP and educated patient on changes to exercises and addition of new exercises      PT Education - 02/20/21 0858     Education Details form/technique with exercise    Person(s) Educated Patient    Methods Explanation;Verbal cues    Comprehension Verbalized understanding;Verbal cues required;Returned demonstration              PT Short Term Goals - 02/20/21 0857       PT SHORT TERM GOAL #1   Title Pt will be independent with HEP to improve functional mobility.    Baseline 6/8: Initiated  7/5: Met    Time 5    Period Weeks    Status Achieved    Target Date 02/22/21               PT Long Term Goals - 02/20/21 0858       PT LONG TERM GOAL #1   Title Pt will improve Berg balance by > 5 points to demonstrate  decreased risk of falls.    Baseline 6/8: 35/56 7/5: 46/56    Time 10    Period Weeks    Status Achieved      PT LONG TERM GOAL #2   Title Pt will improve FOTO to target score to display improvements in functional mobility.    Baseline 6/8: 40/51 7/5: 50/51    Time 10    Period Weeks    Status Partially Met      PT  LONG TERM GOAL #3   Title Pt will be able to ambulate >=1000 ft for 6MWT with <=2 rest breaks in order to walk community level distances to care for his daughter.    Baseline 6/13: 864 ft 7/5: 1000 ft (6 min 30 sec)    Time 10    Period Weeks    Status Achieved      PT LONG TERM GOAL #4   Title Pt will complete 5xSTS in 10 sec or less to demonstrate BLE strength improvements and reduced risk of falls.    Baseline 6/13: 60 sec 7/5: 20 SEC    Time 10    Period Weeks    Status Partially Met      PT LONG TERM GOAL #5   Title Pt will improve global hip MMT to 4/5 to demonstate improved LE strength for standing tasks.    Baseline 6/8: 3+/5 globally 7/5: 4/5 bilateral abduction, 3+/5 bilateral ext, 3+/5 bilateral flexion, 3/5 bilateral adduction.    Time 10    Period Weeks    Status Partially Met               Plan - 02/20/21 5465     Clinical Impression Statement Pt presents for follow-up for LBP, generalized weakness, and imbalance. He demonstrates improvement in his static balance with ability to hold romberg stance with eyes closed with decrease hip sway and ability to hold weight object outside of his base of support. Low back pain continues to be a limiting factor during session with pt needing several rest breaks to stretch back and rest to decrease pain. He will continue to benefit from skilled PT to increase his LE strength, dynamic and static balance, and to decrease his LBP to carry out tasks to care for his baby girl such as bending and picking her up.    Personal Factors and Comorbidities Age;Time since onset of injury/illness/exacerbation;Comorbidity  3+;Past/Current Experience;Fitness    Comorbidities x of thoracic myelopathy with sensory deficits from trunk down with sensory T8 level, hx of chronic LBP with MRI results of L4-L5 facet arthropathy, and L5-S1 spinal stenosis, L4-L5 disc extrusion. Does report radicular pain , morbid obesity, HTN.    Examination-Activity Limitations Squat;Bed Mobility;Stairs;Locomotion Level;Stand;Caring for Others;Sit;Transfers    Examination-Participation Restrictions Shop;Community Activity    Stability/Clinical Decision Making Evolving/Moderate complexity    Rehab Potential Fair    PT Frequency 2x / week    PT Duration Other (comment)   10   PT Treatment/Interventions ADLs/Self Care Home Management;Cryotherapy;Moist Heat;Gait training;Functional mobility training;Electrical Stimulation;Aquatic Therapy;Therapeutic activities;Therapeutic exercise;Balance training;Neuromuscular re-education;Patient/family education;Manual techniques;Dry needling;DME Instruction    PT Next Visit Plan Circuit training: Progress standing hip strengthening, static and dynamic balance, followed by ambulating.    PT Home Exercise Plan Access Code [email or web address]    Consulted and Agree with Plan of Care Patient            HEP includes following exercises:  Access Code: [email or web address] URL: [email or web address] Date: 02/20/2021 Prepared by: Bradly Chris  Exercises Supine Lower Trunk Rotation - 1 x daily - 7 x weekly - 2 sets - 12 reps Supine Double Knee to Chest - 1 x daily - 7 x weekly - 2 sets - 15 reps Seated Slump Nerve Glide - 1 x daily - 7 x weekly - 2 sets - 10 reps Seated Hamstring Stretch - 1 x daily - 7 x weekly - 1 sets - 5 reps - 30 hold Seated Hip External Rotation Stretch - 1 x daily -  7 x weekly - 1 sets - 5 reps - 15 hold Romberg Stance with Eyes Closed - 1 x daily - 7 x weekly - 1 sets - 3 reps - 30 hold Standing Toe Taps - 1 x daily - 3 x weekly - 2 sets - 5 reps Sit to Stand - 1 x daily - 7 x weekly -  2 sets - 10 reps Standing Marching - 1 x daily - 3 x weekly - 2 sets - 10 reps    Patient will benefit from skilled therapeutic intervention in order to improve the following deficits and impairments:  Abnormal gait, Decreased knowledge of use of DME, Impaired sensation, Pain, Improper body mechanics, Decreased mobility, Postural dysfunction, Decreased activity tolerance, Decreased range of motion, Decreased strength, Decreased balance, Difficulty walking, Impaired flexibility, Obesity  Visit Diagnosis: Abnormality of gait  Muscle weakness (generalized)  Difficulty in walking, not elsewhere classified     Problem List Patient Active Problem List   Diagnosis Date Noted   Lumbar facet arthropathy 01/11/2021   Thoracic spondylosis with myelopathy 01/11/2021   Thoracic spinal stenosis 06/06/2017   Stenosis, spinal, thoracic 12/15/2014   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 [place], [address] Phone: [phone]   Fax:  [phone]  Name: Travis Fitzpatrick MRN: [id number] Date of Birth: July 19, 1981

## 2021-02-22 ENCOUNTER — Ambulatory Visit: Payer: Medicare Other | Admitting: Physical Therapy

## 2021-02-22 DIAGNOSIS — M6281 Muscle weakness (generalized): Secondary | ICD-10-CM

## 2021-02-22 DIAGNOSIS — R262 Difficulty in walking, not elsewhere classified: Secondary | ICD-10-CM

## 2021-02-22 DIAGNOSIS — R269 Unspecified abnormalities of gait and mobility: Secondary | ICD-10-CM | POA: Diagnosis not present

## 2021-02-22 NOTE — Therapy (Signed)
[facility] [address] [place], [address] Phone: [phone]   Fax:  [phone]  Physical Therapy Treatment  Patient Details  Name: Travis Fitzpatrick MRN: [id number] Date of Birth: 07-02-1981 Referring Provider (PT): Alger Simons   Encounter Date: 02/22/2021   PT End of Session - 02/22/21 0854     Visit Number 8    Number of Visits 21    Date for PT Re-Evaluation 03/29/21    PT Start Time 0845    PT Stop Time 0930    PT Time Calculation (min) 45 min    Equipment Utilized During Treatment Gait belt    Activity Tolerance Patient tolerated treatment well;Patient limited by pain    Behavior During Therapy [facility] for tasks assessed/performed             Past Medical History:  Diagnosis Date   Bowel trouble    urgency   Medical history non-contributory    Urinary urgency     Past Surgical History:  Procedure Laterality Date   BACK SURGERY  2010   CIRCUMCISION     LUMBAR LAMINECTOMY/DECOMPRESSION MICRODISCECTOMY  07/20/2011   Procedure: LUMBAR LAMINECTOMY/DECOMPRESSION MICRODISCECTOMY;  Surgeon: Winfield Cunas;  Location: [place] [facility]UR[facility]S;  Service: Neurosurgery;  Laterality: N/A;  right thoracotomy with thoracic eight-nine discectomy and fusion   THORACIC DISCECTOMY  07/16/2012   Procedure: THORACIC DISCECTOMY;  Surgeon: Winfield Cunas, MD;  Location: [place] NEUR[facility] ORS;  Service: Neurosurgery;  Laterality: Right;  RIGHT Thoracic seven-eight  thoracic diskectomy via thoracotomy by dr Cyndia Bent   THORACIC DISCECTOMY N/A 12/15/2014   Procedure: THORACIC SEVEN TO THORACIC NINE Laminectomy ;  Surgeon: Ashok Pall, MD;  Location: [place] NEURO ORS;  Service: Neurosurgery;  Laterality: N/A;   THORACIC DISCECTOMY N/A 06/06/2017   Procedure: LAMINECTOMY THORACIC NINE-TEN;  Surgeon: Ashok Pall, MD;  Location: [place];  Service: Neurosurgery;  Laterality: N/A;  LAMINECTOMY THORACIC NINE-TEN   THORACOTOMY  07/20/2011    Procedure: THORACOTOMY OPEN FOR SPINE SURGERY;  Surgeon: Pierre Bali, MD;  Location: [facility];  Service: Vascular;  Laterality: N/A;   THORACOTOMY  07/16/2012   Procedure: THORACOTOMY OPEN FOR SPINE SURGERY;  Surgeon: Gaye Pollack, MD;  Location: [facility] NEURO ORS;  Service: Thoracic;  Laterality: N/A;    There were no vitals filed for this visit.   Subjective Assessment - 02/22/21 0846     Subjective He states the he is feeling some soreness in his back from the exercises. He notices being able to stand up longer to do dishes and that he can do more now than he could before.    Pertinent History Pt is a 40 y.o. male referred to PT for LBP, low back ROM, and strengthening. 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 Does report radicular pain , morbid obesity, HTN. Pt reports symptoms since 2010. LBP is described as throbbing and tight and shocks going down BLE's R>L. Goes down post LE to plantar surface of foot, and general dorsum of foot. Pain provoced with sititng prolonged time, sitting, walking. Medicine improves pain. Laying on your back makes pain feel better. reports decreased sensation in radicular pain and does report full numbness which relies on him using walker. Does report increased LBP and BLE pain with going to  have bowel movement. Denies saddle anesthesia, reports intermittent blood in stool denies tarry stools but does have bright red blood in stools when constipated. Worst pain described at 9/10 NPS, best pain reported at 5-6/10 NPS, currently pain recorded at 7/10 NPS. Central LBP. Reports needing to lean forward in community on shopping carts and has back relief with leaning forward and worsening pain with standing upright. Big goal is to improve to feel safer to play and hold with daughter.     Limitations Sitting;House hold activities;Lifting;Standing;Walking    How long can you sit comfortably? 1 hour    How long can you stand comfortably? 40-60 min    How long can you walk comfortably? 2-3 min    Patient Stated Goals Improve pain, ability to hold daughter safely.    Currently in Pain? Yes    Pain Score 6     Pain Location Back    Pain Orientation Right    Pain Descriptors / Indicators Aching    Pain Type Chronic pain    Pain Onset More than a month ago            THEREX: Sit to Stand 1 x 10 -Vcs to sequence exercise  -Pt reports increased pinching and pain in low back pain   Forward and flexion to determine directional preference for pain management -Both direction increase pain    Lower Back Ball Rolls 3 x 10 Middle, Right, Left  Lower Trunk Rotations 1 x 10   Abdominal Crunches 1 x 10   Instruction about colonic massage given patient's interest in wanting to know how to manage constipation and associated low back pain. Sent patient following video to reference:   [email or web address]  Ambulated 600 ft - 6 laps with use of SPC, completed in 3 min 33 sec  -Decreased RLE heel strike with ER of heel  -RPE 6 out of 10  -Patient had to stop to use bathroom at 475 ft   Romberg with 90 degree shoulder flexion with 10 lb weight 30 sec x  2  -Pt demonstrates ankle strategies     PT Education - 02/22/21 0853     Education Details form/technique with exercise    Person(s) Educated Patient    Methods Explanation    Comprehension Verbalized understanding;Verbal cues required;Returned demonstration              PT Short Term Goals - 02/20/21 0857       PT SHORT TERM GOAL #1   Title Pt will be independent with HEP to improve functional mobility.    Baseline 6/8: Initiated  7/5: Met    Time 5    Period Weeks    Status Achieved    Target Date 02/22/21               PT Long Term Goals - 02/20/21 0858       PT LONG  TERM GOAL #1   Title Pt will improve Berg balance by > 5 points to demonstrate decreased risk of falls.    Baseline 6/8: 35/56 7/5: 46/56    Time 10    Period Weeks    Status Achieved      PT LONG TERM GOAL #2   Title Pt will improve FOTO to target score to display improvements in functional mobility.    Baseline 6/8: 40/51 7/5: 50/51    Time 10    Period Weeks    Status Partially Met      PT  LONG TERM GOAL #3   Title Pt will be able to ambulate >=1000 ft for 6MWT with <=2 rest breaks in order to walk community level distances to care for his daughter.    Baseline 6/13: 864 ft 7/5: 1000 ft (6 min 30 sec)    Time 10    Period Weeks    Status Achieved      PT LONG TERM GOAL #4   Title Pt will complete 5xSTS in 10 sec or less to demonstrate BLE strength improvements and reduced risk of falls.    Baseline 6/13: 60 sec 7/5: 20 SEC    Time 10    Period Weeks    Status Partially Met      PT LONG TERM GOAL #5   Title Pt will improve global hip MMT to 4/5 to demonstate improved LE strength for standing tasks.    Baseline 6/8: 3+/5 globally 7/5: 4/5 bilateral abduction, 3+/5 bilateral ext, 3+/5 bilateral flexion, 3/5 bilateral adduction.    Time 10    Period Weeks    Status Partially Met                   Plan - 02/22/21 0854     Personal Factors and Comorbidities Age;Time since onset of injury/illness/exacerbation;Comorbidity 3+;Past/Current Experience;Fitness    Comorbidities x of thoracic myelopathy with sensory deficits from trunk down with sensory T8 level, hx of chronic LBP with MRI results of L4-L5 facet arthropathy, and L5-S1 spinal stenosis, L4-L5 disc extrusion. Does report radicular pain , morbid obesity, HTN.    Examination-Activity Limitations Squat;Bed Mobility;Stairs;Locomotion Level;Stand;Caring for Others;Sit;Transfers    Examination-Participation Restrictions Shop;Community Activity    Stability/Clinical Decision Making Evolving/Moderate complexity     Rehab Potential Fair    PT Frequency 2x / week    PT Duration Other (comment)   10   PT Treatment/Interventions ADLs/Self Care Home Management;Cryotherapy;Moist Heat;Gait training;Functional mobility training;Electrical Stimulation;Aquatic Therapy;Therapeutic activities;Therapeutic exercise;Balance training;Neuromuscular re-education;Patient/family education;Manual techniques;Dry needling;DME Instruction    PT Next Visit Plan Circuit training: Progress standing hip strengthening, static and dynamic balance, followed by ambulating.    PT Home Exercise Plan Access Code [email or web address]    Consulted and Agree with Plan of Care Patient             HEP includes following:    Access Code: [email or web address] URL: [email or web address] Date: 02/22/2021 Prepared by: Bradly Chris  Exercises Supine Lower Trunk Rotation - 1 x daily - 7 x weekly - 2 sets - 12 reps Supine Double Knee to Chest - 1 x daily - 7 x weekly - 2 sets - 15 reps Seated Slump Nerve Glide - 1 x daily - 7 x weekly - 2 sets - 10 reps Seated Hamstring Stretch - 1 x daily - 7 x weekly - 1 sets - 5 reps - 30 hold Seated Hip External Rotation Stretch - 1 x daily - 7 x weekly - 1 sets - 5 reps - 15 hold Romberg Stance with Eyes Closed - 1 x daily - 7 x weekly - 1 sets - 3 reps - 30 hold Standing Toe Taps - 1 x daily - 3 x weekly - 2 sets - 5 reps Sit to Stand - 1 x daily - 7 x weekly - 2 sets - 10 reps Standing Marching - 1 x daily - 3 x weekly - 2 sets - 10 reps Curl Up with Arms Crossed - 1 x daily - 3 x weekly - 2 sets - 10 reps  Patient will benefit from skilled therapeutic intervention in order to improve the following deficits and impairments:  Abnormal gait, Decreased knowledge of use of DME, Impaired sensation, Pain, Improper body mechanics, Decreased mobility, Postural dysfunction, Decreased activity tolerance, Decreased range of motion, Decreased strength, Decreased balance, Difficulty walking, Impaired flexibility,  Obesity  Visit Diagnosis: Abnormality of gait  Muscle weakness (generalized)  Difficulty in walking, not elsewhere classified     Problem List Patient Active Problem List   Diagnosis Date Noted   Lumbar facet arthropathy 01/11/2021   Thoracic spondylosis with myelopathy 01/11/2021   Thoracic spinal stenosis 06/06/2017   Stenosis, spinal, thoracic 12/15/2014   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, [place], [address] Phone: [phone]   Fax:  [phone]  Name: Travis Fitzpatrick MRN: [id number] Date of Birth: 1981-05-06

## 2021-02-27 ENCOUNTER — Ambulatory Visit: Payer: Medicare Other | Admitting: Physical Therapy

## 2021-02-27 ENCOUNTER — Encounter: Payer: Self-pay | Admitting: Physical Therapy

## 2021-02-27 DIAGNOSIS — R262 Difficulty in walking, not elsewhere classified: Secondary | ICD-10-CM

## 2021-02-27 DIAGNOSIS — R2681 Unsteadiness on feet: Secondary | ICD-10-CM

## 2021-02-27 DIAGNOSIS — R269 Unspecified abnormalities of gait and mobility: Secondary | ICD-10-CM | POA: Diagnosis not present

## 2021-02-27 NOTE — Therapy (Signed)
[facility] [address] [place], [address] Phone: [phone]   Fax:  [phone]  Physical Therapy Treatment  Patient Details  Name: Travis Fitzpatrick MRN: [id number] Date of Birth: [birth year] Referring Provider (PT): Alger Simons   Encounter Date: 02/27/2021   PT End of Session - 02/27/21 1336     Visit Number 9    Number of Visits 21    Date for PT Re-Evaluation 03/29/21    PT Start Time 0845    PT Stop Time 0930    PT Time Calculation (min) 45 min    Equipment Utilized During Treatment Gait belt    Activity Tolerance Patient tolerated treatment well;Patient limited by pain    Behavior During Therapy [facility] for tasks assessed/performed             Past Medical History:  Diagnosis Date   Bowel trouble    urgency   Medical history non-contributory    Urinary urgency     Past Surgical History:  Procedure Laterality Date   BACK SURGERY  2010   CIRCUMCISION     LUMBAR LAMINECTOMY/DECOMPRESSION MICRODISCECTOMY  07/20/2011   Procedure: LUMBAR LAMINECTOMY/DECOMPRESSION MICRODISCECTOMY;  Surgeon: Winfield Cunas;  Location: [place] NEURO ORS;  Service: Neurosurgery;  Laterality: N/A;  right thoracotomy with thoracic eight-nine discectomy and fusion   THORACIC DISCECTOMY  07/16/2012   Procedure: THORACIC DISCECTOMY;  Surgeon: Winfield Cunas, MD;  Location: [place] NEUR[facility] ORS;  Service: Neurosurgery;  Laterality: Right;  RIGHT Thoracic seven-eight  thoracic diskectomy via thoracotomy by dr Cyndia Bent   THORACIC DISCECTOMY N/A 12/15/2014   Procedure: THORACIC SEVEN TO THORACIC NINE Laminectomy ;  Surgeon: Ashok Pall, MD;  Location: [employer] [facility] NEURO ORS;  Service: Neurosurgery;  Laterality: N/A;   THORACIC DISCECTOMY N/A 06/06/2017   Procedure: LAMINECTOMY THORACIC NINE-TEN;  Surgeon: Ashok Pall, MD;  Location: [place];  Service: Neurosurgery;  Laterality: N/A;  LAMINECTOMY THORACIC NINE-TEN   THORACOTOMY  07/20/2011    Procedure: THORACOTOMY OPEN FOR SPINE SURGERY;  Surgeon: Pierre Bali, MD;  Location: [facility];  Service: Vascular;  Laterality: N/A;   THORACOTOMY  07/16/2012   Procedure: THORACOTOMY OPEN FOR SPINE SURGERY;  Surgeon: Gaye Pollack, MD;  Location: [facility];  Service: Thoracic;  Laterality: N/A;    There were no vitals filed for this visit.   Subjective Assessment - 02/27/21 1333     Subjective Pt reports being able to all his exercises without experiencing a signficant increase in his pain. He does have some soreness from his exercises.    Pertinent History Pt is a 40 y.o. male referred to PT for LBP, low back ROM, and strengthening. 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 Does report radicular pain , morbid obesity, HTN. Pt reports symptoms since 2010. LBP is described as throbbing and tight and shocks going down BLE's R>L. Goes down post LE to plantar surface of foot, and general dorsum of foot. Pain provoced with sititng prolonged time, sitting, walking. Medicine improves pain. Laying on your back makes pain feel better. reports decreased sensation in radicular pain and does report full numbness which relies on him using walker. Does report increased LBP and BLE pain with going to have bowel movement. Denies saddle anesthesia, reports intermittent blood in stool denies  tarry stools but does have bright red blood in stools when constipated. Worst pain described at 9/10 NPS, best pain reported at 5-6/10 NPS, currently pain recorded at 7/10 NPS. Central LBP. Reports needing to lean forward in community on shopping carts and has back relief with leaning forward and worsening pain with standing upright. Big goal is to improve to feel safer to play and hold with daughter.    Limitations Sitting;House hold  activities;Lifting;Standing;Walking    How long can you sit comfortably? 1 hour    How long can you stand comfortably? 40-60 min    How long can you walk comfortably? 2-3 min    Patient Stated Goals Improve pain, ability to hold daughter safely.    Currently in Pain? Yes    Pain Score 6     Pain Location Back    Pain Orientation Right    Pain Descriptors / Indicators Aching    Pain Type Chronic pain    Pain Onset More than a month ago            THEREX  Sit to Stand 1 x [address], Left, Right 2 x 10  TM HR 129 during exercise 1.5 mph, Dist 0.14 mi, 7 min  Sit to Stand 1 x 10  Romberg Horizontal Head Turns 1 x 10  Romberg Vertical Head Turns 1 x 10  Romberg Eyes Closed 30 sec  -Needs several attempts to accomplish  Romberg with trunk rotations with #20 kettle bell 1 x 10  TM HR 131 Dist 0.14, 7 min  HEP Update: Exchanged Romberg vertical head turns with Romberg trunk rotations with #20     PT Education - 02/27/21 1335     Education Details form/technique with exercise    Person(s) Educated Patient    Methods Explanation    Comprehension Verbal cues required;Verbalized understanding;Returned demonstration              PT Short Term Goals - 02/27/21 1342       PT SHORT TERM GOAL #1   Title Pt will be independent with HEP to improve functional mobility.    Baseline 6/8: Initiated  7/5: Met    Time 5    Period Weeks    Status Achieved    Target Date 02/22/21               PT Long Term Goals - 02/27/21 1342       PT LONG TERM GOAL #1   Title Pt will improve Berg balance by > 5 points to demonstrate decreased risk of falls.    Baseline 6/8: 35/56 7/5: 46/56    Time 10    Period Weeks    Status Achieved      PT LONG TERM GOAL #2   Title Pt will improve FOTO to target score to display improvements in functional mobility.    Baseline 6/8: 40/51 7/5: 50/51    Time 10    Period Weeks    Status Partially Met      PT LONG TERM GOAL #3    Title Pt will be able to ambulate >=1000 ft for 6MWT with <=2 rest breaks in order to walk community level distances to care for his daughter.    Baseline 6/13: 864 ft 7/5: 1000 ft (6 min 30 sec)    Time 10    Period Weeks    Status Achieved      PT LONG TERM GOAL #4  Title Pt will complete 5xSTS in 10 sec or less to demonstrate BLE strength improvements and reduced risk of falls.    Baseline 6/13: 60 sec 7/5: 20 SEC    Time 10    Period Weeks    Status Partially Met      PT LONG TERM GOAL #5   Title Pt will improve global hip MMT to 4/5 to demonstate improved LE strength for standing tasks.    Baseline 6/8: 3+/5 globally 7/5: 4/5 bilateral abduction, 3+/5 bilateral ext, 3+/5 bilateral flexion, 3/5 bilateral adduction.    Time 10    Period Weeks    Status Partially Met                   Plan - 02/27/21 1340     Clinical Impression Statement Pt presents for f/u for LBP, generalized weakness and imbalance. He demonstrates progress with aerobic endurance with ability to walk at mod intensity (60-80%) on TM without needing to take a rest break and ability to perform romberg eyes closed with less episodes of imbalance (4x). He will continue to benefit from skilled PT to progress LE strength, static and dynamic balance, and aerobic endurance in ordert to safely care for his daughter without risk of falling.    Personal Factors and Comorbidities Age;Time since onset of injury/illness/exacerbation;Comorbidity 3+;Past/Current Experience;Fitness    Comorbidities x of thoracic myelopathy with sensory deficits from trunk down with sensory T8 level, hx of chronic LBP with MRI results of L4-L5 facet arthropathy, and L5-S1 spinal stenosis, L4-L5 disc extrusion. Does report radicular pain , morbid obesity, HTN.    Examination-Activity Limitations Squat;Bed Mobility;Stairs;Locomotion Level;Stand;Caring for Others;Sit;Transfers    Examination-Participation Restrictions Shop;Community Activity     Stability/Clinical Decision Making Evolving/Moderate complexity    Rehab Potential Fair    PT Frequency 2x / week    PT Duration Other (comment)   10   PT Treatment/Interventions ADLs/Self Care Home Management;Cryotherapy;Moist Heat;Gait training;Functional mobility training;Electrical Stimulation;Aquatic Therapy;Therapeutic activities;Therapeutic exercise;Balance training;Neuromuscular re-education;Patient/family education;Manual techniques;Dry needling;DME Instruction    PT Next Visit Plan Circuit training: Overground walking, toe taps for static balance and reduce UE support for standing hip exercises.    PT Home Exercise Plan Access Code [email or web address]    Consulted and Agree with Plan of Care Patient            HEP includes the following:   Access Code: [email or web address] URL: [email or web address] Date: 02/27/2021 Prepared by: Bradly Chris  Exercises Supine Lower Trunk Rotation - 1 x daily - 7 x weekly - 2 sets - 12 reps Supine Double Knee to Chest - 1 x daily - 7 x weekly - 2 sets - 15 reps Seated Slump Nerve Glide - 1 x daily - 7 x weekly - 2 sets - 10 reps Seated Hamstring Stretch - 1 x daily - 7 x weekly - 1 sets - 5 reps - 30 hold Seated Hip External Rotation Stretch - 1 x daily - 7 x weekly - 1 sets - 5 reps - 15 hold Romberg Stance with Eyes Closed - 1 x daily - 7 x weekly - 1 sets - 3 reps - 30 hold Standing Toe Taps - 1 x daily - 3 x weekly - 2 sets - 5 reps Sit to Stand - 1 x daily - 7 x weekly - 2 sets - 10 reps Standing Marching - 1 x daily - 3 x weekly - 2 sets - 10 reps Curl Up with Arms Crossed - 1 x daily -  3 x weekly - 2 sets - 10 reps     Patient will benefit from skilled therapeutic intervention in order to improve the following deficits and impairments:  Abnormal gait, Decreased knowledge of use of DME, Impaired sensation, Pain, Improper body mechanics, Decreased mobility, Postural dysfunction, Decreased activity tolerance, Decreased range of motion,  Decreased strength, Decreased balance, Difficulty walking, Impaired flexibility, Obesity  Visit Diagnosis: Abnormality of gait  Unsteadiness on feet  Difficulty in walking, not elsewhere classified     Problem List Patient Active Problem List   Diagnosis Date Noted   Lumbar facet arthropathy 01/11/2021   Thoracic spondylosis with myelopathy 01/11/2021   Thoracic spinal stenosis 06/06/2017   Stenosis, spinal, thoracic 12/15/2014   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, [place], [address] Phone: [phone]   Fax:  [phone]  Name: KENSHAWN MACIOLEK MRN: [id number] Date of Birth: 10-Jan-1981

## 2021-03-01 ENCOUNTER — Ambulatory Visit: Payer: Medicare Other | Admitting: Physical Therapy

## 2021-03-01 DIAGNOSIS — R262 Difficulty in walking, not elsewhere classified: Secondary | ICD-10-CM

## 2021-03-01 DIAGNOSIS — R269 Unspecified abnormalities of gait and mobility: Secondary | ICD-10-CM

## 2021-03-01 DIAGNOSIS — M6281 Muscle weakness (generalized): Secondary | ICD-10-CM

## 2021-03-01 DIAGNOSIS — R2681 Unsteadiness on feet: Secondary | ICD-10-CM

## 2021-03-01 NOTE — Therapy (Signed)
[facility] [address] [place], [address] Phone: [phone]   Fax:  [phone]  Physical Therapy Progress Note    Reporting Period: 01/18/21-03/01/21 Patient Details  Name: Travis Fitzpatrick MRN: [id number] Date of Birth: Jan 10, 1981 Referring Provider (PT): Alger Simons   Encounter Date: 03/01/2021   PT End of Session - 03/01/21 0851     Visit Number 10    Number of Visits 21    Date for PT Re-Evaluation 03/29/21    PT Start Time 0845    PT Stop Time 0930    PT Time Calculation (min) 45 min    Equipment Utilized During Treatment Gait belt    Activity Tolerance Patient tolerated treatment well;Patient limited by pain    Behavior During Therapy [facility] for tasks assessed/performed             Past Medical History:  Diagnosis Date   Bowel trouble    urgency   Medical history non-contributory    Urinary urgency     Past Surgical History:  Procedure Laterality Date   BACK SURGERY  2010   CIRCUMCISION     LUMBAR LAMINECTOMY/DECOMPRESSION MICRODISCECTOMY  07/20/2011   Procedure: LUMBAR LAMINECTOMY/DECOMPRESSION MICRODISCECTOMY;  Surgeon: Winfield Cunas;  Location: [place] [facility];  Service: Neurosurgery;  Laterality: N/A;  right thoracotomy with thoracic eight-nine discectomy and fusion   THORACIC DISCECTOMY  07/16/2012   Procedure: THORACIC DISCECTOMY;  Surgeon: Winfield Cunas, MD;  Location: [place] [facility];  Service: Neurosurgery;  Laterality: Right;  RIGHT Thoracic seven-eight  thoracic diskectomy via thoracotomy by dr Cyndia Bent   THORACIC DISCECTOMY N/A 12/15/2014   Procedure: THORACIC SEVEN TO THORACIC NINE Laminectomy ;  Surgeon: Ashok Pall, MD;  Location: [place] [facility];  Service: Neurosurgery;  Laterality: N/A;   THORACIC DISCECTOMY N/A 06/06/2017   Procedure: LAMINECTOMY THORACIC NINE-TEN;  Surgeon: Ashok Pall, MD;  Location: [place];  Service: Neurosurgery;  Laterality: N/A;  LAMINECTOMY THORACIC  NINE-TEN   THORACOTOMY  07/20/2011   Procedure: THORACOTOMY OPEN FOR SPINE SURGERY;  Surgeon: Pierre Bali, MD;  Location: [facility];  Service: Vascular;  Laterality: N/A;   THORACOTOMY  07/16/2012   Procedure: THORACOTOMY OPEN FOR SPINE SURGERY;  Surgeon: Gaye Pollack, MD;  Location: [facility];  Service: Thoracic;  Laterality: N/A;    There were no vitals filed for this visit.   Subjective Assessment - 03/01/21 0848     Subjective He reports that he is still experiencing back pain even after taking medicine. Pain has actually improved since starting therapy, but still hurts.    Pertinent History Pt is a 40 y.o. male referred to PT for LBP, low back ROM, and strengthening. 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 Does report radicular pain , morbid obesity, HTN. Pt reports symptoms since 2010. LBP is described as throbbing and tight and shocks going down BLE's R>L. Goes down post LE to plantar surface of foot, and general dorsum of foot. Pain provoced with sititng prolonged time, sitting, walking. Medicine improves pain. Laying on your back makes pain feel better. reports decreased sensation in radicular pain and does report full numbness which relies on him using walker. Does report increased LBP and BLE pain with going to have bowel movement. Denies saddle anesthesia, reports  intermittent blood in stool denies tarry stools but does have bright red blood in stools when constipated. Worst pain described at 9/10 NPS, best pain reported at 5-6/10 NPS, currently pain recorded at 7/10 NPS. Central LBP. Reports needing to lean forward in community on shopping carts and has back relief with leaning forward and worsening pain with standing upright. Big goal is to improve to feel safer to play and hold with daughter.     Limitations Sitting;House hold activities;Lifting;Standing;Walking    How long can you sit comfortably? 1 hour    How long can you stand comfortably? 40-60 min    How long can you walk comfortably? 2-3 min    Patient Stated Goals Improve pain, ability to hold daughter safely.    Currently in Pain? Yes    Pain Score 5     Pain Location Back    Pain Orientation Right    Pain Descriptors / Indicators Aching    Pain Type Chronic pain    Pain Onset More than a month ago             PHYSICAL PERFORMANCE   5 X STS: 26 sec   BERG Balance: 49/56 -2/4 on alternate placing foot on stool  -2/4 on unsupported heel to toe  -1/4 Stand on one leg   41-56 Low Risk of Falling (Lusardi 2004)  THEREX:  Romberg Horiz Head Turns 1 x 10  Romberg Vert Head Turns 1 x 10   Lumbar Flexion [employer], Left, Right 3  x 10   SLR 2 x 5    Updated HEP and educated patient on changes to exercises and addition of new exercises              PT Short Term Goals - 03/01/21 4742       PT SHORT TERM GOAL #1   Title Pt will be independent with HEP to improve functional mobility.    Baseline 6/8: Initiated  7/5: Met    Time 5    Period Weeks    Status Achieved    Target Date 02/22/21               PT Long Term Goals - 03/01/21 5956       PT LONG TERM GOAL #1   Title Pt will improve Berg balance by > 5 points to demonstrate decreased risk of falls.    Baseline 6/8: 35/56 7/5: 46/56  7/20: 49/56    Time 10    Period Weeks    Status Achieved      PT LONG TERM GOAL #2   Title Pt will improve FOTO to target score to display improvements in functional mobility.    Baseline 6/8: 40/51 7/5: 50/51 7/20: 50/51    Time 10    Period Weeks    Status Partially Met      PT LONG TERM GOAL #3   Title Pt will be able to ambulate >=1000 ft for 6MWT with <=2 rest breaks in order to walk community level distances to care for his daughter.    Baseline 6/13: 864 ft 7/5: 1000 ft (6  min 30 sec)    Time 10    Period Weeks    Status Achieved      PT LONG TERM GOAL #4   Title Pt will complete 5xSTS in 10 sec or less to demonstrate BLE strength improvements and reduced risk of falls.    Baseline 6/13: 60 sec 7/5: 20  [place] 7/20: 29 sec    Time 10    Period Weeks    Status Partially Met      PT LONG TERM GOAL #5   Title Pt will improve global hip MMT to 4/5 to demonstate improved LE strength for standing tasks.    Baseline 6/8: 3+/5 globally 7/20: 4/5 bilateral abduction, 4/5 bilateral ext, 3+/5 bilateral flexion, 3+/5 bilateral adduction.    Time 10    Period Weeks    Status Partially Met    Target Date 03/29/21                   Plan - 03/01/21 2992     Clinical Impression Statement Pt presents for re-evaluation of goals for progress note for LBP, generalized weakness and imbalance. He demonstrates a signficant improvement in his static and dynmaic balance decreasing his fall risk from medium to low level. He exhibits improvements in hip abduction and extension, but still shows the same hip flexion and adduction. Pt still demonstrates LE weakness and he is limited by intermittent back spasms. Pt will continue to benefit from skilled PT to progress LE strength, balance, and hip strength in order to safely hold daughter while caring for her and to reduce his risk of falling.    Personal Factors and Comorbidities Age;Time since onset of injury/illness/exacerbation;Comorbidity 3+;Past/Current Experience;Fitness    Comorbidities x of thoracic myelopathy with sensory deficits from trunk down with sensory T8 level, hx of chronic LBP with MRI results of L4-L5 facet arthropathy, and L5-S1 spinal stenosis, L4-L5 disc extrusion. Does report radicular pain , morbid obesity, HTN.    Examination-Activity Limitations Squat;Bed Mobility;Stairs;Locomotion Level;Stand;Caring for Others;Sit;Transfers    Examination-Participation Restrictions Shop;Community Activity     Stability/Clinical Decision Making Evolving/Moderate complexity    Rehab Potential Fair    PT Frequency 2x / week    PT Duration Other (comment)   10   PT Treatment/Interventions ADLs/Self Care Home Management;Cryotherapy;Moist Heat;Gait training;Functional mobility training;Electrical Stimulation;Aquatic Therapy;Therapeutic activities;Therapeutic exercise;Balance training;Neuromuscular re-education;Patient/family education;Manual techniques;Dry needling;DME Instruction    PT Next Visit Plan Standing Adduction Exericse and Straight Leg Raise and Sit to Stand. Single Leg Balance Activities    PT Home Exercise Plan Access Code [email or web address]    Consulted and Agree with Plan of Care Patient            HEP includes following exercises:  Access Code: [email or web address] URL: [email or web address] Date: 03/01/2021 Prepared by: Bradly Chris  Exercises Supine Lower Trunk Rotation - 1 x daily - 7 x weekly - 2 sets - 12 reps Supine Double Knee to Chest - 1 x daily - 7 x weekly - 2 sets - 15 reps Seated Slump Nerve Glide - 1 x daily - 7 x weekly - 2 sets - 10 reps Seated Hamstring Stretch - 1 x daily - 7 x weekly - 1 sets - 5 reps - 30 hold Seated Hip External Rotation Stretch - 1 x daily - 7 x weekly - 1 sets - 5 reps - 15 hold Romberg Stance with Eyes Closed - 1 x daily - 7 x weekly - 1 sets - 3 reps - 30 hold Standing Toe Taps - 1 x daily - 3 x weekly - 2 sets - 5 reps Sit to Stand - 1 x daily - 7 x weekly - 2 sets - 10 reps Standing Marching - 1 x daily - 3 x weekly - 2 sets - 10 reps Curl Up with Arms Crossed - 1 x daily - 3  x weekly - 2 sets - 10 reps Standing Hip Adduction with Ankle Weight - 1 x daily - 3 x weekly - 2 sets - 10 reps Supine Active Straight Leg Raise - 1 x daily - 3 x weekly - 2 sets - 5 reps     Patient will benefit from skilled therapeutic intervention in order to improve the following deficits and impairments:  Abnormal gait, Decreased knowledge of use of DME,  Impaired sensation, Pain, Improper body mechanics, Decreased mobility, Postural dysfunction, Decreased activity tolerance, Decreased range of motion, Decreased strength, Decreased balance, Difficulty walking, Impaired flexibility, Obesity  Visit Diagnosis: Abnormality of gait  Unsteadiness on feet  Difficulty in walking, not elsewhere classified  Muscle weakness (generalized)     Problem List Patient Active Problem List   Diagnosis Date Noted   Lumbar facet arthropathy 01/11/2021   Thoracic spondylosis with myelopathy 01/11/2021   Thoracic spinal stenosis 06/06/2017   Stenosis, spinal, thoracic 12/15/2014   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 [address] [place], [address] Phone: [phone]   Fax:  [phone]  Name: Travis Fitzpatrick MRN: [id number] Date of Birth: 11-22-1980

## 2021-03-06 ENCOUNTER — Ambulatory Visit: Payer: Medicare Other | Admitting: Physical Therapy

## 2021-03-06 DIAGNOSIS — R2681 Unsteadiness on feet: Secondary | ICD-10-CM

## 2021-03-06 DIAGNOSIS — R269 Unspecified abnormalities of gait and mobility: Secondary | ICD-10-CM

## 2021-03-06 DIAGNOSIS — R262 Difficulty in walking, not elsewhere classified: Secondary | ICD-10-CM

## 2021-03-06 NOTE — Therapy (Signed)
[facility] [address], [place], [address] Phone: [phone]   Fax:  [phone]  Physical Therapy Treatment  Patient Details  Name: Travis Fitzpatrick MRN: [id number] Date of Birth: [birth year] Referring Provider (PT): Alger Simons   Encounter Date: 03/06/2021    Past Medical History:  Diagnosis Date   Bowel trouble    urgency   Medical history non-contributory    Urinary urgency     Past Surgical History:  Procedure Laterality Date   BACK SURGERY  2010   CIRCUMCISION     LUMBAR LAMINECTOMY/DECOMPRESSION MICRODISCECTOMY  07/20/2011   Procedure: LUMBAR LAMINECTOMY/DECOMPRESSION MICRODISCECTOMY;  Surgeon: Winfield Cunas;  Location: [place] [facility];  Service: Neurosurgery;  Laterality: N/A;  right thoracotomy with thoracic eight-nine discectomy and fusion   THORACIC DISCECTOMY  07/16/2012   Procedure: THORACIC DISCECTOMY;  Surgeon: Winfield Cunas, MD;  Location: [place] [facility];  Service: Neurosurgery;  Laterality: Right;  RIGHT Thoracic seven-eight  thoracic diskectomy via thoracotomy by dr Cyndia Bent   THORACIC DISCECTOMY N/A 12/15/2014   Procedure: THORACIC SEVEN TO THORACIC NINE Laminectomy ;  Surgeon: Ashok Pall, MD;  Location: [place] [facility];  Service: Neurosurgery;  Laterality: N/A;   THORACIC DISCECTOMY N/A 06/06/2017   Procedure: LAMINECTOMY THORACIC NINE-TEN;  Surgeon: Ashok Pall, MD;  Location: [place];  Service: Neurosurgery;  Laterality: N/A;  LAMINECTOMY THORACIC NINE-TEN   THORACOTOMY  07/20/2011   Procedure: THORACOTOMY OPEN FOR SPINE SURGERY;  Surgeon: Pierre Bali, MD;  Location: [facility];  Service: Vascular;  Laterality: N/A;   THORACOTOMY  07/16/2012   Procedure: THORACOTOMY OPEN FOR SPINE SURGERY;  Surgeon: Gaye Pollack, MD;  Location: [facility];  Service: Thoracic;  Laterality: N/A;    There were no vitals filed for this visit.   Subjective Assessment - 03/06/21 0850      Subjective Pt reports experiencing increased back pain which he believes is due to constipation.    Pertinent History Pt is a 40 y.o. male referred to PT for LBP, low back ROM, and strengthening. 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 Does report radicular pain , morbid obesity, HTN. Pt reports symptoms since 2010. LBP is described as throbbing and tight and shocks going down BLE's R>L. Goes down post LE to plantar surface of foot, and general dorsum of foot. Pain provoced with sititng prolonged time, sitting, walking. Medicine improves pain. Laying on your back makes pain feel better. reports decreased sensation in radicular pain and does report full numbness which relies on him using walker. Does report increased LBP and BLE pain with going to have bowel movement. Denies saddle anesthesia, reports intermittent blood in stool denies tarry stools but does have bright red blood in stools when constipated. Worst pain described at 9/10 NPS, best pain reported at 5-6/10 NPS, currently pain recorded at 7/10 NPS. Central LBP. Reports needing to lean forward in community on shopping carts and has back relief with leaning forward and worsening pain with standing upright. Big goal is to improve to feel safer to play and hold with daughter.    Limitations Sitting;House hold activities;Lifting;Standing;Walking    How long can you sit comfortably? 1 hour    How long can you stand comfortably? 40-60 min    How long can you  walk comfortably? 2-3 min    Patient Stated Goals Improve pain, ability to hold daughter safely.    Currently in Pain? Yes    Pain Score 6     Pain Location Back    Pain Orientation Right    Pain Descriptors / Indicators Aching    Pain Type Chronic pain    Pain Radiating Towards Towards legs    Pain Onset More  than a month ago    Pain Frequency Constant            NMR:  Semi-Tandem with Left Back 1 x 30 sec   Modified SLS on 6 inch cone with RLE as stance  1 x 30 sec   Modified SLS on 4 inch yoga block with LLE as stance leg 1 x 30 sec   Modified SLS on 6 inch yoga block with LLE as stance leg 1 x 30 sec    Toe Taps on 4 inch yoga block  1 x 10   THEREX:  [employer], Left, Right 1 x 10 Sit to Stand 3 x 10 -Intermittent hip sway   Standing Hip Abduction with BUE support 2 x 10  -Vcs to maintain upright posture  - Pt reports increased back pain        PT Short Term Goals - 03/06/21 0911       PT SHORT TERM GOAL #1   Title Pt will be independent with HEP to improve functional mobility.    Baseline 6/8: Initiated  7/5: Met    Time 5    Period Weeks    Status Achieved    Target Date 02/22/21               PT Long Term Goals - 03/06/21 0912       PT LONG TERM GOAL #1   Title Pt will improve Berg balance by > 5 points to demonstrate decreased risk of falls.    Baseline 6/8: 35/56 7/5: 46/56  7/20: 49/56    Time 10    Period Weeks    Status Achieved      PT LONG TERM GOAL #2   Title Pt will improve FOTO to target score to display improvements in functional mobility.    Baseline 6/8: 40/51 7/5: 50/51 7/20: 50/51    Time 10    Period Weeks    Status Partially Met      PT LONG TERM GOAL #3   Title Pt will be able to ambulate >=1000 ft for 6MWT with <=2 rest breaks in order to walk community level distances to care for his daughter.    Baseline 6/13: 864 ft 7/5: 1000 ft (6 min 30 sec)    Time 10    Period Weeks    Status Achieved      PT LONG TERM GOAL #4   Title Pt will complete 5xSTS in 10 sec or less to demonstrate BLE strength improvements and reduced risk of falls.    Baseline 6/13: 60 sec 7/5: 20 SEC 7/20: 29 sec    Time 10    Period Weeks    Status Partially Met      PT LONG TERM GOAL #5   Title Pt will improve global hip MMT to 4/5 to  demonstate improved LE strength for standing tasks.    Baseline 6/8: 3+/5 globally 7/20: 4/5 bilateral abduction, 4/5 bilateral ext, 3+/5 bilateral flexion, 3+/5 bilateral adduction.    Time 10    Period  Weeks    Status Partially Met            HEP includes following:    Access Code: [email or web address] URL: [email or web address] Date: 03/06/2021 Prepared by: Bradly Chris  Exercises Supine Lower Trunk Rotation - 1 x daily - 7 x weekly - 2 sets - 12 reps Supine Double Knee to Chest - 1 x daily - 7 x weekly - 2 sets - 15 reps Seated Slump Nerve Glide - 1 x daily - 7 x weekly - 2 sets - 10 reps Seated Hamstring Stretch - 1 x daily - 7 x weekly - 1 sets - 5 reps - 30 hold Seated Hip External Rotation Stretch - 1 x daily - 7 x weekly - 1 sets - 5 reps - 15 hold Standing Toe Taps - 1 x daily - 3 x weekly - 2 sets - 5 reps Sit to Stand - 1 x daily - 3 x weekly - 3 sets - 10 reps Standing Marching - 1 x daily - 3 x weekly - 2 sets - 10 reps Curl Up with Arms Crossed - 1 x daily - 3 x weekly - 2 sets - 10 reps Standing Hip Adduction with Ankle Weight - 1 x daily - 3 x weekly - 2 sets - 10 reps Supine Active Straight Leg Raise - 1 x daily - 3 x weekly - 2 sets - 5 reps Standing Toe Taps - 1 x daily - 7 x weekly - 2 sets - 10 reps       Plan - 03/06/21 0907     Clinical Impression Statement Pt presents for f/u for LBP and unsteadiness. He demonstrates improved LE strength with ability to perform increased sit to stand reps. Pt also shows improved static balance with ability to perform single leg exercises such as standing hip abduction and modified single leg stand and toe taps. He will continue to benefit from skilled PT to decrease his risk of falling and increase his steadiness when caring for his daughter.    Personal Factors and Comorbidities Age;Time since onset of injury/illness/exacerbation;Comorbidity 3+;Past/Current Experience;Fitness    Comorbidities x of thoracic  myelopathy with sensory deficits from trunk down with sensory T8 level, hx of chronic LBP with MRI results of L4-L5 facet arthropathy, and L5-S1 spinal stenosis, L4-L5 disc extrusion. Does report radicular pain , morbid obesity, HTN.    Examination-Activity Limitations Squat;Bed Mobility;Stairs;Locomotion Level;Stand;Caring for Others;Sit;Transfers    Examination-Participation Restrictions Shop;Community Activity    Stability/Clinical Decision Making Evolving/Moderate complexity    Rehab Potential Fair    PT Frequency 2x / week    PT Duration Other (comment)   10   PT Treatment/Interventions ADLs/Self Care Home Management;Cryotherapy;Moist Heat;Gait training;Functional mobility training;Electrical Stimulation;Aquatic Therapy;Therapeutic activities;Therapeutic exercise;Balance training;Neuromuscular re-education;Patient/family education;Manual techniques;Dry needling;DME Instruction    PT Next Visit Plan Standing hip strengthening exercises. Progress single leg stance activities    PT Home Exercise Plan Access Code [email or web address]    Consulted and Agree with Plan of Care Patient             Patient will benefit from skilled therapeutic intervention in order to improve the following deficits and impairments:  Abnormal gait, Decreased knowledge of use of DME, Impaired sensation, Pain, Improper body mechanics, Decreased mobility, Postural dysfunction, Decreased activity tolerance, Decreased range of motion, Decreased strength, Decreased balance, Difficulty walking, Impaired flexibility, Obesity  Visit Diagnosis: Abnormality of gait  Unsteadiness on feet  Difficulty in walking, not elsewhere classified     Problem List  Patient Active Problem List   Diagnosis Date Noted   Lumbar facet arthropathy 01/11/2021   Thoracic spondylosis with myelopathy 01/11/2021   Thoracic spinal stenosis 06/06/2017   Stenosis, spinal, thoracic 12/15/2014   Intervertebral disc disorder of thoracic region with  myelopathy 09/22/2014   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/2011   Bradly Chris PT, DPT  03/06/2021, 1:19 PM  [facility] [address] [place], [address] Phone: [phone]   Fax:  [phone]  Name: Travis Fitzpatrick MRN: [id number] Date of Birth: 08-26-1980

## 2021-03-08 ENCOUNTER — Encounter: Payer: Self-pay | Admitting: Physical Therapy

## 2021-03-08 ENCOUNTER — Ambulatory Visit: Payer: Medicare Other | Admitting: Physical Therapy

## 2021-03-08 DIAGNOSIS — R269 Unspecified abnormalities of gait and mobility: Secondary | ICD-10-CM

## 2021-03-08 DIAGNOSIS — R2681 Unsteadiness on feet: Secondary | ICD-10-CM

## 2021-03-08 DIAGNOSIS — R262 Difficulty in walking, not elsewhere classified: Secondary | ICD-10-CM

## 2021-03-08 NOTE — Therapy (Signed)
[facility] [address] [place], [address] Phone: [phone]   Fax:  [phone]  Physical Therapy Treatment  Patient Details  Name: Travis Fitzpatrick MRN: [id number] Date of Birth: 1981/07/12 Referring Provider (PT): Alger Simons   Encounter Date: 03/08/2021    Past Medical History:  Diagnosis Date   Bowel trouble    urgency   Medical history non-contributory    Urinary urgency     Past Surgical History:  Procedure Laterality Date   BACK SURGERY  2010   CIRCUMCISION     LUMBAR LAMINECTOMY/DECOMPRESSION MICRODISCECTOMY  07/20/2011   Procedure: LUMBAR LAMINECTOMY/DECOMPRESSION MICRODISCECTOMY;  Surgeon: Winfield Cunas;  Location: [facility];  Service: Neurosurgery;  Laterality: N/A;  right thoracotomy with thoracic eight-nine discectomy and fusion   THORACIC DISCECTOMY  07/16/2012   Procedure: THORACIC DISCECTOMY;  Surgeon: Winfield Cunas, MD;  Location: [facility];  Service: Neurosurgery;  Laterality: Right;  RIGHT Thoracic seven-eight  thoracic diskectomy via thoracotomy by dr Cyndia Bent   THORACIC DISCECTOMY N/A 12/15/2014   Procedure: THORACIC SEVEN TO THORACIC NINE Laminectomy ;  Surgeon: Ashok Pall, MD;  Location: [facility];  Service: Neurosurgery;  Laterality: N/A;   THORACIC DISCECTOMY N/A 06/06/2017   Procedure: LAMINECTOMY THORACIC NINE-TEN;  Surgeon: Ashok Pall, MD;  Location: [facility];  Service: Neurosurgery;  Laterality: N/A;  LAMINECTOMY THORACIC NINE-TEN   THORACOTOMY  07/20/2011   Procedure: THORACOTOMY OPEN FOR SPINE SURGERY;  Surgeon: Pierre Bali, MD;  Location: [facility];  Service: Vascular;  Laterality: N/A;   THORACOTOMY  07/16/2012   Procedure: THORACOTOMY OPEN FOR SPINE SURGERY;  Surgeon: Gaye Pollack, MD;  Location: [facility];  Service: Thoracic;  Laterality: N/A;    There were no vitals filed for this visit.   Subjective Assessment - 03/08/21 0846      Subjective Pt reports feeling sore from workout and that his back pain feels about the same.    Pertinent History Pt is a 40 y.o. male referred to PT for LBP, low back ROM, and strengthening. 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 Does report radicular pain , morbid obesity, HTN. Pt reports symptoms since 2010. LBP is described as throbbing and tight and shocks going down BLE's R>L. Goes down post LE to plantar surface of foot, and general dorsum of foot. Pain provoced with sititng prolonged time, sitting, walking. Medicine improves pain. Laying on your back makes pain feel better. reports decreased sensation in radicular pain and does report full numbness which relies on him using walker. Does report increased LBP and BLE pain with going to have bowel movement. Denies saddle anesthesia, reports intermittent blood in stool denies tarry stools but does have bright red blood in stools when constipated. Worst pain described at 9/10 NPS, best pain reported at 5-6/10 NPS, currently pain recorded at 7/10 NPS. Central LBP. Reports needing to lean forward in community on shopping carts and has back relief with leaning forward and worsening pain with standing upright. Big goal is to improve to feel safer to play and hold with daughter.    Limitations Sitting;House hold activities;Lifting;Standing;Walking    How long can you sit comfortably? 1 hour    How long can you stand comfortably? 40-60 min    How long  can you walk comfortably? 2-3 min    Patient Stated Goals Improve pain, ability to hold daughter safely.    Currently in Pain? Yes    Pain Score 6     Pain Location Back    Pain Orientation Right    Pain Descriptors / Indicators Aching    Pain Type Chronic pain    Pain Onset More than a month ago             THEREX:  EMCOR, Left, Right 1 x 10 Wall Sits 2 x 30 sec  Sit to Stand 1 x 5 -Intermittent use of hands  TM Incline 1.0 mph 1.3 5 min -Self terminated because of back spasms and urge to void.  Pt describes this happen together quite often.  NMR:   Semi-Tandem 2 x 30 sec  Semi-Tandem with Horizontal Head Turns  2 x 10  -Min VC to cross arms for increased difficulty  -Intermittent use of hands for steadiness   Semi-Tandem with Vertical Head Turns 2 x 10   Modified SLS on 6 inch Step   2 x 30 sec   Toe Taps on 6 inch platform alternating 2 x 10            PT Education - 03/08/21 0908     Education Details form/technique with exercise    Person(s) Educated Patient    Methods Explanation    Comprehension Verbalized understanding;Returned demonstration;Verbal cues required              PT Short Term Goals - 03/08/21 0911       PT SHORT TERM GOAL #1   Title Pt will be independent with HEP to improve functional mobility.    Baseline 6/8: Initiated  7/5: Met    Time 5    Period Weeks    Status Achieved    Target Date 02/22/21               PT Long Term Goals - 03/08/21 0912       PT LONG TERM GOAL #1   Title Pt will improve Berg balance by > 5 points to demonstrate decreased risk of falls.    Baseline 6/8: 35/56 7/5: 46/56  7/20: 49/56    Time 10    Period Weeks    Status Achieved      PT LONG TERM GOAL #2   Title Pt will improve FOTO to target score to display improvements in functional mobility.    Baseline 6/8: 40/51 7/5: 50/51 7/20: 50/51    Time 10    Period Weeks    Status Partially Met      PT LONG TERM GOAL #3   Title Pt will be able to ambulate >=1000 ft for 6MWT with <=2 rest breaks in order to walk community level distances to care for his daughter.    Baseline 6/13: 864 ft 7/5: 1000 ft (6 min 30 sec)    Time 10    Period Weeks    Status Achieved      PT LONG TERM GOAL #4   Title Pt will complete 5xSTS in  10 sec or less to demonstrate BLE strength improvements and reduced risk of falls.    Baseline 6/13: 60 sec 7/5: 20 SEC 7/20: 29 sec    Time 10    Period Weeks    Status Partially Met      PT LONG TERM GOAL #5   Title Pt will improve  global hip MMT to 4/5 to demonstate improved LE strength for standing tasks.    Baseline 6/8: 3+/5 globally 7/20: 4/5 bilateral abduction, 4/5 bilateral ext, 3+/5 bilateral flexion, 3+/5 bilateral adduction.    Time 10    Period Weeks    Status Partially Met                   Plan - 03/08/21 0917     Clinical Impression Statement Pt presents for f/u for LBP and unsteadiness. He exhibits improved dynamic balance with ability to perform toe taps on 6 inch step and core strength with ability to hold wall sit. He was limited by muscle spasms during session, and he and had to take several standing rest breaks and void several times. He expressed concern about this and he wondered why this was happening. PT instructed pt that he would follow-up with his neurologist about symptoms. Pt will continue to benefit from skilled PT to improve LE strength, dynamic and static balance, and LBP to decrease his risk of falling and safely care for his daughter.    Personal Factors and Comorbidities Age;Time since onset of injury/illness/exacerbation;Comorbidity 3+;Past/Current Experience;Fitness    Comorbidities x of thoracic myelopathy with sensory deficits from trunk down with sensory T8 level, hx of chronic LBP with MRI results of L4-L5 facet arthropathy, and L5-S1 spinal stenosis, L4-L5 disc extrusion. Does report radicular pain , morbid obesity, HTN.    Examination-Activity Limitations Squat;Bed Mobility;Stairs;Locomotion Level;Stand;Caring for Others;Sit;Transfers    Examination-Participation Restrictions Shop;Community Activity    Stability/Clinical Decision Making Evolving/Moderate complexity    Rehab Potential Fair    PT Frequency 2x / week    PT Duration Other  (comment)   10   PT Treatment/Interventions ADLs/Self Care Home Management;Cryotherapy;Moist Heat;Gait training;Functional mobility training;Electrical Stimulation;Aquatic Therapy;Therapeutic activities;Therapeutic exercise;Balance training;Neuromuscular re-education;Patient/family education;Manual techniques;Dry needling;DME Instruction    PT Next Visit Plan Message Dr. Christella Noa about frequent voiding and muscle spasms. Progress standing hip exercises, static and dynamic balance exercises, and aerobic exercise on TM    PT Home Exercise Plan Access Code [email or web address]    Consulted and Agree with Plan of Care Patient            HEP includes following:  Access Code: [email or web address] URL: [email or web address] Date: 03/08/2021 Prepared by: Bradly Chris  Exercises Supine Lower Trunk Rotation - 1 x daily - 7 x weekly - 2 sets - 12 reps Supine Double Knee to Chest - 1 x daily - 7 x weekly - 2 sets - 15 reps Seated Slump Nerve Glide - 1 x daily - 7 x weekly - 2 sets - 10 reps Seated Hamstring Stretch - 1 x daily - 7 x weekly - 1 sets - 5 reps - 30 hold Seated Hip External Rotation Stretch - 1 x daily - 7 x weekly - 1 sets - 5 reps - 15 hold Standing Toe Taps - 1 x daily - 3 x weekly - 2 sets - 5 reps Sit to Stand - 1 x daily - 3 x weekly - 3 sets - 10 reps Standing Marching - 1 x daily - 3 x weekly - 2 sets - 10 reps Standing Hip Adduction with Ankle Weight - 1 x daily - 3 x weekly - 2 sets - 10 reps Supine Active Straight Leg Raise - 1 x daily - 3 x weekly - 2 sets - 5 reps Standing Toe Taps - 1 x daily - 7 x weekly - 2 sets - 10 reps Wall Squat -  1 x daily - 3 x weekly - 1 sets - 5 reps - 30 hold   Patient will benefit from skilled therapeutic intervention in order to improve the following deficits and impairments:  Abnormal gait, Decreased knowledge of use of DME, Impaired sensation, Pain, Improper body mechanics, Decreased mobility, Postural dysfunction, Decreased activity tolerance,  Decreased range of motion, Decreased strength, Decreased balance, Difficulty walking, Impaired flexibility, Obesity  Visit Diagnosis: Abnormality of gait  Unsteadiness on feet  Difficulty in walking, not elsewhere classified     Problem List Patient Active Problem List   Diagnosis Date Noted   Lumbar facet arthropathy 01/11/2021   Thoracic spondylosis with myelopathy 01/11/2021   Thoracic spinal stenosis 06/06/2017   Stenosis, spinal, thoracic 12/15/2014   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, [place], [address] Phone: [phone]   Fax:  [phone]  Name: Travis Fitzpatrick MRN: [id number] Date of Birth: 1980-10-27

## 2021-03-21 ENCOUNTER — Telehealth: Payer: Self-pay | Admitting: Physical Therapy

## 2021-03-21 ENCOUNTER — Ambulatory Visit: Payer: Medicare Other | Attending: Physical Medicine & Rehabilitation | Admitting: Physical Therapy

## 2021-03-21 NOTE — Telephone Encounter (Signed)
Called pt because of his absence from apt. Unable to reach and left VM instructing pt to call back to reschedule.

## 2021-03-23 ENCOUNTER — Ambulatory Visit: Payer: Medicare Other | Admitting: Physical Therapy

## 2021-03-28 ENCOUNTER — Encounter: Payer: Medicare Other | Admitting: Physical Therapy

## 2021-03-29 ENCOUNTER — Encounter: Payer: Self-pay | Admitting: Physical Medicine & Rehabilitation

## 2021-03-29 ENCOUNTER — Other Ambulatory Visit: Payer: Self-pay

## 2021-03-29 ENCOUNTER — Encounter: Payer: Medicare Other | Attending: Physical Medicine & Rehabilitation | Admitting: Physical Medicine & Rehabilitation

## 2021-03-29 VITALS — BP 117/76 | HR 89 | Temp 98.3°F | Ht 69.0 in | Wt 341.8 lb

## 2021-03-29 DIAGNOSIS — M4714 Other spondylosis with myelopathy, thoracic region: Secondary | ICD-10-CM | POA: Diagnosis present

## 2021-03-29 DIAGNOSIS — M47816 Spondylosis without myelopathy or radiculopathy, lumbar region: Secondary | ICD-10-CM | POA: Insufficient documentation

## 2021-03-29 NOTE — Patient Instructions (Signed)
STOOL SOFTENER AND LAXATIVE RECS:  SENOKOT-S (SENNA + COLACE):  2 TABS AT BEDTIME DAILY.  TRY THAT FOR A FEW DAYS.   IF THAT DOESN'T WORK, INCREASE TO 2 TABS TWICE DAILY.    IF THAT DOESN'T WORK YOU CAN TRY MIRALAX 17GM DAILY IN THE MORNING.    STOP DULCOLAX FOR NOW.

## 2021-03-29 NOTE — Progress Notes (Signed)
Subjective:    Patient ID: Travis Fitzpatrick, male    DOB: 1980/10/02, 40 y.o.   MRN: [id number]  HPI  Mr. Litle is here in follow up of his thoracic myelopathy. He feels that he's stronger but he's reporting pain in his lower abdomen and groin when he tries to void or have a bm. He is only emptying his bowels once per week. He has no issues emptying his bladder. He has noticed some blood in his stool.Marland Kitchen   His back and leg pain has improved. The pain is more manageable at present although it's still in the same areas. He's only taking 2 gabapentin per day because 3 makes him too sleepy. He uses his cane for balance. He hasn't had any falls. He's looking in to getting a treadmill or stationary bike at home    Pain Inventory Average Pain 6 Pain Right Now 5 My pain is sharp, stabbing, and tingling  In the last 24 hours, has pain interfered with the following? General activity 6 Relation with others 7 Enjoyment of life 9 What TIME of day is your pain at its worst? varies Sleep (in general) Poor  Pain is worse with: walking, bending, sitting, standing, and some activites Pain improves with: medication Relief from Meds: 5  Family History  Problem Relation Age of Onset   Hypertension Father    Social History   Socioeconomic History   Marital status: Single    Spouse name: Not on file   Number of children: Not on file   Years of education: Not on file   Highest education level: Not on file  Occupational History   Not on file  Tobacco Use   Smoking status: Never   Smokeless tobacco: Never  Vaping Use   Vaping Use: Never used  Substance and Sexual Activity   Alcohol use: No    Alcohol/week: 0.0 standard drinks   Drug use: No   Sexual activity: Never  Other Topics Concern   Not on file  Social History Narrative   Not on file   Social Determinants of Health   Financial Resource Strain: Not on file  Food Insecurity: Not on file  Transportation Needs: Not on file   Physical Activity: Not on file  Stress: Not on file  Social Connections: Not on file   Past Surgical History:  Procedure Laterality Date   BACK SURGERY  2010   CIRCUMCISION     LUMBAR LAMINECTOMY/DECOMPRESSION MICRODISCECTOMY  07/20/2011   Procedure: LUMBAR LAMINECTOMY/DECOMPRESSION MICRODISCECTOMY;  Surgeon: Carmela Hurt;  Location: [facility];  Service: Neurosurgery;  Laterality: N/A;  right thoracotomy with thoracic eight-nine discectomy and fusion   THORACIC DISCECTOMY  07/16/2012   Procedure: THORACIC DISCECTOMY;  Surgeon: Carmela Hurt, MD;  Location: [facility];  Service: Neurosurgery;  Laterality: Right;  RIGHT Thoracic seven-eight  thoracic diskectomy via thoracotomy by dr Laneta Simmers   THORACIC DISCECTOMY N/A 12/15/2014   Procedure: THORACIC SEVEN TO THORACIC NINE Laminectomy ;  Surgeon: Coletta Memos, MD;  Location: [facility];  Service: Neurosurgery;  Laterality: N/A;   THORACIC DISCECTOMY N/A 06/06/2017   Procedure: LAMINECTOMY THORACIC NINE-TEN;  Surgeon: Coletta Memos, MD;  Location: [facility];  Service: Neurosurgery;  Laterality: N/A;  LAMINECTOMY THORACIC NINE-TEN   THORACOTOMY  07/20/2011   Procedure: THORACOTOMY OPEN FOR SPINE SURGERY;  Surgeon: Norton Blizzard, MD;  Location: [facility];  Service: Vascular;  Laterality: N/A;   THORACOTOMY  07/16/2012   Procedure: THORACOTOMY OPEN FOR  SPINE SURGERY;  Surgeon: Alleen Borne, MD;  Location: [facility];  Service: Thoracic;  Laterality: N/A;   Past Surgical History:  Procedure Laterality Date   BACK SURGERY  2010   CIRCUMCISION     LUMBAR LAMINECTOMY/DECOMPRESSION MICRODISCECTOMY  07/20/2011   Procedure: LUMBAR LAMINECTOMY/DECOMPRESSION MICRODISCECTOMY;  Surgeon: Carmela Hurt;  Location: [facility];  Service: Neurosurgery;  Laterality: N/A;  right thoracotomy with thoracic eight-nine discectomy and fusion   THORACIC DISCECTOMY  07/16/2012   Procedure: THORACIC DISCECTOMY;  Surgeon: Carmela Hurt, MD;  Location: [facility];  Service: Neurosurgery;  Laterality: Right;  RIGHT Thoracic seven-eight  thoracic diskectomy via thoracotomy by dr Laneta Simmers   THORACIC DISCECTOMY N/A 12/15/2014   Procedure: THORACIC SEVEN TO THORACIC NINE Laminectomy ;  Surgeon: Coletta Memos, MD;  Location: [facility];  Service: Neurosurgery;  Laterality: N/A;   THORACIC DISCECTOMY N/A 06/06/2017   Procedure: LAMINECTOMY THORACIC NINE-TEN;  Surgeon: Coletta Memos, MD;  [facility]: [facility];  Service: Neurosurgery;  Laterality: N/A;  LAMINECTOMY THORACIC NINE-TEN   THORACOTOMY  07/20/2011   Procedure: THORACOTOMY OPEN FOR SPINE SURGERY;  Surgeon: Norton Blizzard, MD;  Location: [facility];  Service: Vascular;  Laterality: N/A;   THORACOTOMY  07/16/2012   Procedure: THORACOTOMY OPEN FOR SPINE SURGERY;  Surgeon: Alleen Borne, MD;  Location: [facility];  Service: Thoracic;  Laterality: N/A;   Past Medical History:  Diagnosis Date   Bowel trouble    urgency   Medical history non-contributory    Urinary urgency    BP 117/76 (BP Location: Right Arm)   Pulse 89   Temp 98.3 F (36.8 C) (Oral)   Ht 5\' 9"  (1.753 m)   Wt (!) 341 lb 12.8 oz (155 kg)   SpO2 96%   BMI 50.48 kg/m   Opioid Risk Score:   Fall Risk Score:  `1  Depression screen PHQ 2/9  Depression screen PHQ 2/9 01/11/2021  Decreased Interest 3  Down, Depressed, Hopeless 1  PHQ - 2 Score 4  Altered sleeping 3  Tired, decreased energy 3  Change in appetite 3  Feeling bad or failure about yourself  0  Trouble concentrating 2  Moving slowly or fidgety/restless 1  Suicidal thoughts 0  PHQ-9 Score 16      Review of Systems  Constitutional: Negative.   HENT: Negative.    Eyes: Negative.   Respiratory: Negative.    Cardiovascular: Negative.   Gastrointestinal: Negative.   Endocrine: Negative.   Genitourinary: Negative.   Musculoskeletal:  Positive for back pain and gait problem.  Skin: Negative.   Allergic/Immunologic: Negative.   Hematological: Negative.    Psychiatric/Behavioral: Negative.        Objective:   Physical Exam  General: No acute distress. obese HEENT: NCAT, EOMI, oral membranes moist Cards: reg rate  Chest: normal effort Abdomen: RLQ/inguinal pain Skin: dry, intact Extremities: no edema Psych: pleasant and appropriate  Skin: intact Neuro: Cranial nerve exam intact.  Cognitively appropriate.  Patient with a mid thoracic sensory level approximately T8 and location.  Sensory loss most dense in the trunk but also with sensory loss in both legs.  Strength is actually 5 out of 5 in both lower extremities with some limitations at times due to his back pain.  Reflexes are trace.  There is no resting tone.  He has full range of motion.  In standing he has some difficulties with balance and with change of directions due to his  sensory deficits. Musculoskeletal: Low back tender. Right inguinal tenderness             Assessment & Plan:  1. Hx of thoracic myelopathy with residual sensory deficits from trunk down.  He has a T8 sensory level on exam 2.  History of chronic low back pain.  Exam most consistent with facet arthropathy.  MRI from December reveals significant facet arthropathy at L4-L5 as well as L5-S1 with foraminal stenosis bilaterally.  I suspect the foraminal stenosis at L5-S1 is contributing to his radicular symptoms in the legs.  He also has a small extruded disc at L4-L5 which could be contributing to his low back pain.  3.  His more recent lumbar symptoms are overlapping his pre-existing sensory/proprioceptive deficits and are worsening his gait and balance. 4. See no sign of neurogenic bowel or bladder. Pt does have back pain which he pushes to empty his bowels on toilet. 5. Morbid obesity 6. RLQ/inguinal pain: may be due to constipation, vs hernia     Plan: 1.   gabapentin   600 mg bid.  consider adding TCA if pain is progressive. I told him that this pain can wax and wane.  2.  Trial of diclofenac 75 mg twice  daily with food.    3.  Continue with HEP. He needs to maintain activity and know his limits from a safety standpoint given his decreased proprioception. 4.  He may require lumbar medial branch blocks at some point. I have reviewed his MRI's with him and he doesn't want intervention at this point. 5. Bowel program was reviewed. Will start with senna-s to improve bowel habits. This may help RLQ/inquinal pain. If not, may need to look at u/s or CT abdomen. I'm hopeful that if we just decompress him a bit, that these sx will improve   15 min of face to face patient care time were spent during this visit. All questions were encouraged and answered. Follow up with me in 3 mos.

## 2021-03-30 ENCOUNTER — Encounter: Payer: Medicare Other | Admitting: Physical Therapy

## 2021-04-04 ENCOUNTER — Encounter: Payer: Medicare Other | Admitting: Physical Therapy

## 2021-04-06 ENCOUNTER — Encounter: Payer: Medicare Other | Admitting: Physical Therapy

## 2021-04-11 ENCOUNTER — Encounter: Payer: Medicare Other | Admitting: Physical Therapy

## 2021-04-13 ENCOUNTER — Encounter: Payer: Medicare Other | Admitting: Physical Therapy

## 2021-04-19 ENCOUNTER — Encounter: Payer: Medicare Other | Admitting: Physical Therapy

## 2021-04-25 ENCOUNTER — Encounter: Payer: Medicare Other | Admitting: Physical Therapy

## 2021-04-27 ENCOUNTER — Encounter: Payer: Medicare Other | Admitting: Physical Therapy

## 2021-05-28 ENCOUNTER — Other Ambulatory Visit: Payer: Self-pay | Admitting: Physical Medicine & Rehabilitation

## 2021-05-28 DIAGNOSIS — M47816 Spondylosis without myelopathy or radiculopathy, lumbar region: Secondary | ICD-10-CM

## 2021-05-28 DIAGNOSIS — M5104 Intervertebral disc disorders with myelopathy, thoracic region: Secondary | ICD-10-CM

## 2021-05-28 DIAGNOSIS — M4714 Other spondylosis with myelopathy, thoracic region: Secondary | ICD-10-CM

## 2021-05-28 DIAGNOSIS — M4804 Spinal stenosis, thoracic region: Secondary | ICD-10-CM

## 2021-05-30 NOTE — Telephone Encounter (Signed)
This is Dr. Aundra Millet Lovorn's patient

## 2021-06-13 ENCOUNTER — Other Ambulatory Visit: Payer: Self-pay | Admitting: Physical Medicine & Rehabilitation

## 2021-06-13 DIAGNOSIS — M5104 Intervertebral disc disorders with myelopathy, thoracic region: Secondary | ICD-10-CM

## 2021-06-13 DIAGNOSIS — M4804 Spinal stenosis, thoracic region: Secondary | ICD-10-CM

## 2021-06-13 DIAGNOSIS — M4714 Other spondylosis with myelopathy, thoracic region: Secondary | ICD-10-CM

## 2021-06-13 DIAGNOSIS — M47816 Spondylosis without myelopathy or radiculopathy, lumbar region: Secondary | ICD-10-CM

## 2021-06-28 ENCOUNTER — Other Ambulatory Visit: Payer: Self-pay

## 2021-06-28 ENCOUNTER — Encounter: Payer: Medicare Other | Attending: Physical Medicine & Rehabilitation | Admitting: Physical Medicine & Rehabilitation

## 2021-06-28 ENCOUNTER — Encounter: Payer: Self-pay | Admitting: Physical Medicine & Rehabilitation

## 2021-06-28 VITALS — HR 96 | Temp 98.3°F | Ht 69.0 in | Wt 345.0 lb

## 2021-06-28 DIAGNOSIS — M47816 Spondylosis without myelopathy or radiculopathy, lumbar region: Secondary | ICD-10-CM | POA: Diagnosis present

## 2021-06-28 DIAGNOSIS — M4714 Other spondylosis with myelopathy, thoracic region: Secondary | ICD-10-CM | POA: Diagnosis not present

## 2021-06-28 DIAGNOSIS — M5104 Intervertebral disc disorders with myelopathy, thoracic region: Secondary | ICD-10-CM | POA: Insufficient documentation

## 2021-06-28 DIAGNOSIS — M79642 Pain in left hand: Secondary | ICD-10-CM | POA: Diagnosis present

## 2021-06-28 DIAGNOSIS — M4804 Spinal stenosis, thoracic region: Secondary | ICD-10-CM | POA: Diagnosis not present

## 2021-06-28 DIAGNOSIS — M79641 Pain in right hand: Secondary | ICD-10-CM | POA: Diagnosis not present

## 2021-06-28 DIAGNOSIS — M79643 Pain in unspecified hand: Secondary | ICD-10-CM | POA: Insufficient documentation

## 2021-06-28 MED ORDER — DICLOFENAC SODIUM 75 MG PO TBEC: 75.0000 mg | DELAYED_RELEASE_TABLET | Freq: Two times a day (BID) | ORAL | 5 refills | Status: DC

## 2021-06-28 MED ORDER — GABAPENTIN 600 MG PO TABS: 600.0000 mg | ORAL_TABLET | Freq: Two times a day (BID) | ORAL | 6 refills | Status: DC

## 2021-06-28 NOTE — Patient Instructions (Addendum)
PLEASE FEEL FREE TO CALL OUR OFFICE WITH ANY PROBLEMS OR QUESTIONS ([phone])    SUPPLEMENTS USEFUL FOR OSTEOARTHRITIS: OMEGA 3 FATTY ACIDS, TURMERIC, GINGER, TART CHERRY EXTRACT, CELERY SEED, GLUCOSAMINE WITH CHONDROITIN     

## 2021-06-28 NOTE — Progress Notes (Signed)
Subjective:    Patient ID: Travis Fitzpatrick, male    DOB: Dec 05, 1980, 40 y.o.   MRN: [id number]  HPI  [name] is here in follow up of his thoracic myelopathy. I last saw him in August. His low back pain can come and go. It gets worse if he stands and sits for longer periods of time. If he walks on his treadmill, his back will stiffen up after 30 minutes.   He also is reporting "achiness" in his hands for the past couple months. He feels that he needs to "pop" them frequently. 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 He remains diclofenac and gabapentin as described which have helped.    Pain Inventory6 Average Pain  Pain Right Now 5 My pain is constant, sharp, stabbing, and aching  In the last 24 hours, has pain interfered with the following? General activity 5 Relation with others 5 Enjoyment of life 5 What TIME of day is your pain at its worst? morning  and evening Sleep (in general)  poor-fair  Pain is worse with: walking, bending, sitting, standing, and some activites Pain improves with: medication Relief from Meds:  a little      Family History  Problem Relation Age of Onset   Hypertension Father    Social History   Socioeconomic History   Marital status: Single    Spouse name: Not on file   Number of children: Not on file   Years of education: Not on file   Highest education level: Not on file  Occupational History   Not on file  Tobacco Use   Smoking status: Never   Smokeless tobacco: Never  Vaping Use   Vaping Use: Never used  Substance and Sexual Activity   Alcohol use: No    Alcohol/week: 0.0 standard drinks   Drug use: No   Sexual activity: Never  Other Topics Concern   Not on file  Social History Narrative   Not on file   Social Determinants of Health   Financial Resource Strain: Not on file  Food Insecurity: Not on file  Transportation Needs: Not on file   Physical Activity: Not on file  Stress: Not on file  Social Connections: Not on file   Past Surgical History:  Procedure Laterality Date   BACK SURGERY  2010   CIRCUMCISION     LUMBAR LAMINECTOMY/DECOMPRESSION MICRODISCECTOMY  07/20/2011   Procedure: LUMBAR LAMINECTOMY/DECOMPRESSION MICRODISCECTOMY;  Surgeon: Carmela Hurt;  Location: [facility];  Service: Neurosurgery;  Laterality: N/A;  right thoracotomy with thoracic eight-nine discectomy and fusion   THORACIC DISCECTOMY  07/16/2012   Procedure: THORACIC DISCECTOMY;  Surgeon: Carmela Hurt, MD;  Location: [facility];  Service: Neurosurgery;  Laterality: Right;  RIGHT Thoracic seven-eight  thoracic diskectomy via thoracotomy by dr Laneta Simmers   THORACIC DISCECTOMY N/A 12/15/2014   Procedure: THORACIC SEVEN TO THORACIC NINE Laminectomy ;  Surgeon: Coletta Memos, MD;  Location: [facility];  Service: Neurosurgery;  Laterality: N/A;   THORACIC DISCECTOMY N/A 06/06/2017   Procedure: LAMINECTOMY THORACIC NINE-TEN;  Surgeon: Coletta Memos, MD;  Location: [facility];  Service: Neurosurgery;  Laterality: N/A;  LAMINECTOMY THORACIC NINE-TEN   THORACOTOMY  07/20/2011   Procedure: THORACOTOMY OPEN FOR SPINE SURGERY;  Surgeon: Norton Blizzard, MD;  Location: [facility];  Service: Vascular;  Laterality: N/A;   THORACOTOMY  07/16/2012   Procedure: THORACOTOMY OPEN  FOR SPINE SURGERY;  Surgeon: Alleen Borne, MD;  Location: [facility];  Service: Thoracic;  Laterality: N/A;   Past Medical History:  Diagnosis Date   Bowel trouble    urgency   Medical history non-contributory    Urinary urgency    Pulse 96   Temp 98.3 F (36.8 C)   Ht 5\' 9"  (1.753 m)   Wt (!) 345 lb (156.5 kg)   SpO2 98%   BMI 50.95 kg/m   Opioid Risk Score:   Fall Risk Score:  `1  Depression screen PHQ 2/9  Depression screen [facility] 2/9 03/29/2021 01/11/2021  Decreased Interest 1 3  Down, Depressed, Hopeless 1 1  PHQ - 2 Score 2 4  Altered sleeping - 3  Tired, decreased energy  - 3  Change in appetite - 3  Feeling bad or failure about yourself  - 0  Trouble concentrating - 2  Moving slowly or fidgety/restless - 1  Suicidal thoughts - 0  PHQ-9 Score - 16    Review of Systems  Musculoskeletal:  Positive for back pain and gait problem.       Legs back and hands  All other systems reviewed and are negative.     Objective:   Physical Exam  General: No acute distress HEENT: NCAT, EOMI, oral membranes moist Cards: reg rate  Chest: normal effort Abdomen: Soft, NT, ND Skin: dry, intact Extremities: no edema Psych: pleasant and appropriate  Skin: intact Neuro: Cranial nerve exam intact.  Cognitively appropriate.  Patient with a mid thoracic sensory level approximately T8 and location.  Sensory loss most dense in the trunk but also with sensory loss in both legs.  Strength is actually 5 out of 5 in both lower extremities with some limitations at times due to his back pain.  Reflexes are trace.  There is no resting tone.  He has full range of motion.  In standing he has some difficulties with balance and with change of directions due to his sensory deficits. Musculoskeletal: low back TTP. Mild pain in PIP/MCP's with palpation. No swelling, warmth or deformities. Strong grip.              Assessment & Plan:  1. Hx of thoracic myelopathy with residual sensory deficits from trunk down.  He has a T8 sensory level on exam 2.  History of chronic low back pain.  Exam most consistent with facet arthropathy.  MRI from December reveals significant facet arthropathy at L4-L5 as well as L5-S1 with foraminal stenosis bilaterally.  I suspect the foraminal stenosis at L5-S1 is contributing to his radicular symptoms in the legs.  He also has a small extruded disc at L4-L5 which could be contributing to his low back pain.  3.  His more recent lumbar symptoms are overlapping his pre-existing sensory/proprioceptive deficits and are worsening his gait and balance. 4. See no sign of  neurogenic bowel or bladder. Pt does have back pain which he pushes to empty his bowels on toilet. 5. Morbid obesity 6. Hand pain. ?mild OA. Pt has fam hx of RA     Plan: 1.  Continue gabapentin  600 mg bid.   2.  RFf diclofenac 75 mg twice daily with food.   3.  observe hands. Given FHX of RA we need to be observant. Gave him a list of supplements to rx inflammation 4.  Consider MBB's 5. Bowels moving better with scheduled lax annd softnere. Less pain 5. 15 minutes of face to face  patient care time were spent during this visit. All questions were encouraged and answered. Follow up with me in 4 mos.

## 2021-07-23 ENCOUNTER — Other Ambulatory Visit: Payer: Self-pay | Admitting: Physical Medicine & Rehabilitation

## 2021-07-23 DIAGNOSIS — M4804 Spinal stenosis, thoracic region: Secondary | ICD-10-CM

## 2021-07-23 DIAGNOSIS — M5104 Intervertebral disc disorders with myelopathy, thoracic region: Secondary | ICD-10-CM

## 2021-07-23 DIAGNOSIS — M47816 Spondylosis without myelopathy or radiculopathy, lumbar region: Secondary | ICD-10-CM

## 2021-07-23 DIAGNOSIS — M4714 Other spondylosis with myelopathy, thoracic region: Secondary | ICD-10-CM

## 2021-08-20 ENCOUNTER — Encounter: Payer: Self-pay | Admitting: Physical Medicine & Rehabilitation

## 2021-09-05 ENCOUNTER — Other Ambulatory Visit: Payer: Self-pay | Admitting: Neurosurgery

## 2021-09-05 DIAGNOSIS — M4726 Other spondylosis with radiculopathy, lumbar region: Secondary | ICD-10-CM

## 2021-09-18 ENCOUNTER — Other Ambulatory Visit: Payer: Self-pay

## 2021-09-18 ENCOUNTER — Ambulatory Visit
Admission: RE | Admit: 2021-09-18 | Discharge: 2021-09-18 | Disposition: A | Discharge status: Home / Self Care | Payer: Medicare Other | Source: Ambulatory Visit | Attending: Neurosurgery | Admitting: Neurosurgery

## 2021-09-18 DIAGNOSIS — M4726 Other spondylosis with radiculopathy, lumbar region: Secondary | ICD-10-CM | POA: Diagnosis not present

## 2021-09-18 MED ORDER — GADOBUTROL 1 MMOL/ML IV SOLN
10.0000 mL | Freq: Once | INTRAVENOUS | Status: AC | PRN
  Administered 2021-09-18: 10 mL via INTRAVENOUS

## 2021-09-21 ENCOUNTER — Other Ambulatory Visit: Payer: Self-pay | Admitting: Neurosurgery

## 2021-10-02 NOTE — Progress Notes (Signed)
Surgical Instructions    Your procedure is scheduled on Friday, February 24th, 2023.   Report to [facility] Main Entrance "A" at 06:30 A.M., then check in with the Admitting office.  Call this number if you have problems the morning of surgery:  [phone]   If you have any questions prior to your surgery date call [phone]: Open Monday-Friday 8am-4pm    Remember:  Do not eat after midnight the night before your surgery  You may drink clear liquids until 05:30 the morning of your surgery.   Clear liquids allowed are: Water, Non-Citrus Juices (without pulp), Carbonated Beverages, Clear Tea, Black Coffee ONLY (NO MILK, CREAM OR POWDERED CREAMER of any kind), and Gatorade    Take these medicines the morning of surgery with A SIP OF WATER:   gabapentin (NEURONTIN) loratadine (CLARITIN)    As of today, STOP taking any Aspirin (unless otherwise instructed by your surgeon) Aleve, Naproxen, Ibuprofen, Motrin, Advil, Goody's, BC's, all herbal medications, fish oil, and all vitamins.    The day of surgery:          Do not wear jewelry  Do not wear lotions, powders, colognes, or deodorant. Men may shave face and neck. Do not bring valuables to the hospital.   [facility] is not responsible for any belongings or valuables. .   Do NOT Smoke (Tobacco/Vaping)  24 hours prior to your procedure  If you use a CPAP at night, you may bring your mask for your overnight stay.   Contacts, glasses, hearing aids, dentures or partials may not be worn into surgery, please bring cases for these belongings   For patients admitted to the hospital, discharge time will be determined by your treatment team.   Patients discharged the day of surgery will not be allowed to drive home, and someone needs to stay with them for 24 hours.  NO VISITORS WILL BE ALLOWED IN PRE-OP WHERE PATIENTS ARE PREPPED FOR SURGERY.  ONLY 1 SUPPORT PERSON MAY BE PRESENT IN THE WAITING ROOM WHILE YOU ARE IN SURGERY.  IF  YOU ARE TO BE ADMITTED, ONCE YOU ARE IN YOUR ROOM YOU WILL BE ALLOWED TWO (2) VISITORS. 1 (ONE) VISITOR MAY STAY OVERNIGHT BUT MUST ARRIVE TO THE ROOM BY 8pm.  Minor children may have two parents present. Special consideration for safety and communication needs will be reviewed on a case by case basis.  Special instructions:    Oral Hygiene is also important to reduce your risk of infection.  Remember - BRUSH YOUR TEETH THE MORNING OF SURGERY WITH YOUR REGULAR TOOTHPASTE   [facility]- Preparing For Surgery  Before surgery, you can play an important role. Because skin is not sterile, your skin needs to be as free of germs as possible. You can reduce the number of germs on your skin by washing with CHG (chlorahexidine gluconate) Soap before surgery.  CHG is an antiseptic cleaner which kills germs and bonds with the skin to continue killing germs even after washing.     Please do not use if you have an allergy to CHG or antibacterial soaps. If your skin becomes reddened/irritated stop using the CHG.  Do not shave (including legs and underarms) for at least 48 hours prior to first CHG shower. It is OK to shave your face.  Please follow these instructions carefully.     Shower the NIGHT BEFORE SURGERY and the MORNING OF SURGERY with CHG Soap.   If you chose to wash your hair, wash your  hair first as usual with your normal shampoo. After you shampoo, rinse your hair and body thoroughly to remove the shampoo.  Then [employer] and genitals (private parts) with your normal soap and rinse thoroughly to remove soap.  After that Use CHG Soap as you would any other liquid soap. You can apply CHG directly to the skin and wash gently with a scrungie or a clean washcloth.   Apply the CHG Soap to your body ONLY FROM THE NECK DOWN.  Do not use on open wounds or open sores. Avoid contact with your eyes, ears, mouth and genitals (private parts). Wash Face and genitals (private parts)  with your normal soap.    Wash thoroughly, paying special attention to the area where your surgery will be performed.  Thoroughly rinse your body with warm water from the neck down.  DO NOT shower/wash with your normal soap after using and rinsing off the CHG Soap.  Pat yourself dry with a CLEAN TOWEL.  Wear CLEAN PAJAMAS to bed the night before surgery  Place CLEAN SHEETS on your bed the night before your surgery  DO NOT SLEEP WITH PETS.   Day of Surgery:  Take a shower with CHG soap. Wear Clean/Comfortable clothing the morning of surgery Do not apply any deodorants/lotions.   Remember to brush your teeth WITH YOUR REGULAR TOOTHPASTE.    COVID testing  If you are going to stay overnight or be admitted after your procedure/surgery and require a pre-op COVID test, please follow these instructions after your COVID test   You are not required to quarantine however you are required to wear a well-fitting mask when you are out and around people not in your household.  If your mask becomes wet or soiled, replace with a new one.  Wash your hands often with soap and water for 20 seconds or clean your hands with an alcohol-based hand sanitizer that contains at least 60% alcohol.  Do not share personal items.  Notify your provider: if you are in close contact with someone who has COVID  or if you develop a fever of 100.4 or greater, sneezing, cough, sore throat, shortness of breath or body aches.    Please read over the following fact sheets that you were given.

## 2021-10-03 ENCOUNTER — Encounter (HOSPITAL_COMMUNITY)
Admission: RE | Admit: 2021-10-03 | Discharge: 2021-10-03 | Disposition: A | Discharge status: Home / Self Care | Payer: Medicare Other | Source: Ambulatory Visit | Attending: Neurosurgery | Admitting: Neurosurgery

## 2021-10-03 ENCOUNTER — Encounter (HOSPITAL_COMMUNITY): Payer: Self-pay

## 2021-10-03 ENCOUNTER — Other Ambulatory Visit: Payer: Self-pay

## 2021-10-03 VITALS — BP 140/82 | HR 91 | Temp 98.4°F | Resp 18 | Ht 69.0 in | Wt 344.8 lb

## 2021-10-03 DIAGNOSIS — Z20822 Contact with and (suspected) exposure to covid-19: Secondary | ICD-10-CM | POA: Diagnosis not present

## 2021-10-03 DIAGNOSIS — Z01818 Encounter for other preprocedural examination: Secondary | ICD-10-CM

## 2021-10-03 DIAGNOSIS — Z01812 Encounter for preprocedural laboratory examination: Secondary | ICD-10-CM | POA: Diagnosis not present

## 2021-10-03 LAB — BASIC METABOLIC PANEL
Anion gap: 7 (ref 5–15)
BUN: 14 mg/dL (ref 6–20)
CO2: 26 mmol/L (ref 22–32)
Calcium: 8.9 mg/dL (ref 8.9–10.3)
Chloride: 103 mmol/L (ref 98–111)
Creatinine, Ser: 0.94 mg/dL (ref 0.61–1.24)
GFR, Estimated: >60 mL/min (ref >60)
Glucose, Bld: 100 mg/dL — ABNORMAL HIGH (ref 70–99)
Potassium: 3.9 mmol/L (ref 3.5–5.1)
Sodium: 136 mmol/L (ref 135–145)

## 2021-10-03 LAB — CBC
HCT: 38.3 % — ABNORMAL LOW (ref 39.0–52.0)
Hemoglobin: 12.3 g/dL — ABNORMAL LOW (ref 13.0–17.0)
MCH: 31.6 pg (ref 26.0–34.0)
MCHC: 32.1 g/dL (ref 30.0–36.0)
MCV: 98.5 fL (ref 80.0–100.0)
Platelets: 233 10*3/uL (ref 150–400)
RBC: 3.89 MIL/uL — ABNORMAL LOW (ref 4.22–5.81)
RDW: 12.9 % (ref 11.5–15.5)
WBC: 6 10*3/uL (ref 4.0–10.5)
nRBC: 0 % (ref 0.0–0.2)

## 2021-10-03 LAB — SARS CORONAVIRUS 2 (TAT 6-24 HRS): SARS Coronavirus 2: NEGATIVE

## 2021-10-03 LAB — SURGICAL PCR SCREEN
MRSA, PCR: NEGATIVE
Staphylococcus aureus: NEGATIVE

## 2021-10-03 NOTE — Progress Notes (Signed)
PCP - Lanare, Omao Cardiologist - denies  PPM/ICD - denies Device Orders - n/a Rep Notified - n/a  Chest x-ray - n/a EKG - n/a Stress Test - denies ECHO - denies Cardiac Cath - denies  Sleep Study - denies CPAP - n/a  Fasting Blood Sugar - n/a  Blood Thinner Instructions: n/a  Aspirin Instructions: Patient was instructed: As of today, STOP taking any Aspirin (unless otherwise instructed by your surgeon) Aleve, Naproxen, Ibuprofen, Motrin, Advil, Goody's, BC's, all herbal medications, fish oil, and all vitamins.  ERAS Protcol - yes PRE-SURGERY Ensure or G2- n/a  COVID TEST- done in PAT on 10/03/2021   Anesthesia review: no  Patient denies shortness of breath, fever, cough and chest pain at PAT appointment   All instructions explained to the patient, with a verbal understanding of the material. Patient agrees to go over the instructions while at home for a better understanding. Patient also instructed to self quarantine after being tested for COVID-19. The opportunity to ask questions was provided.

## 2021-10-06 ENCOUNTER — Other Ambulatory Visit: Payer: Self-pay

## 2021-10-06 ENCOUNTER — Encounter (HOSPITAL_COMMUNITY)
Admission: RE | Disposition: A | Discharge status: Home / Self Care | Payer: Self-pay | Source: Home / Self Care | Attending: Neurosurgery

## 2021-10-06 ENCOUNTER — Ambulatory Visit (HOSPITAL_COMMUNITY): Payer: Medicare Other | Admitting: Anesthesiology

## 2021-10-06 ENCOUNTER — Ambulatory Visit (HOSPITAL_BASED_OUTPATIENT_CLINIC_OR_DEPARTMENT_OTHER): Payer: Medicare Other | Admitting: Anesthesiology

## 2021-10-06 ENCOUNTER — Observation Stay (HOSPITAL_COMMUNITY)
Admission: RE | Admit: 2021-10-06 | Discharge: 2021-10-07 | Disposition: A | Discharge status: Home / Self Care | Payer: Medicare Other | Attending: Neurosurgery | Admitting: Neurosurgery

## 2021-10-06 ENCOUNTER — Ambulatory Visit (HOSPITAL_COMMUNITY): Payer: Medicare Other

## 2021-10-06 ENCOUNTER — Encounter (HOSPITAL_COMMUNITY): Payer: Self-pay | Admitting: Neurosurgery

## 2021-10-06 DIAGNOSIS — Z419 Encounter for procedure for purposes other than remedying health state, unspecified: Secondary | ICD-10-CM

## 2021-10-06 DIAGNOSIS — M5126 Other intervertebral disc displacement, lumbar region: Secondary | ICD-10-CM

## 2021-10-06 DIAGNOSIS — M5104 Intervertebral disc disorders with myelopathy, thoracic region: Secondary | ICD-10-CM

## 2021-10-06 DIAGNOSIS — D649 Anemia, unspecified: Secondary | ICD-10-CM

## 2021-10-06 HISTORY — PX: LUMBAR LAMINECTOMY/DECOMPRESSION MICRODISCECTOMY: SHX5026

## 2021-10-06 SURGERY — LUMBAR LAMINECTOMY/DECOMPRESSION MICRODISCECTOMY 2 LEVELS
Anesthesia: General | Site: Spine Cervical | Laterality: Right

## 2021-10-06 MED ORDER — PROPOFOL 10 MG/ML IV BOLUS
INTRAVENOUS | Status: AC
  Filled 2021-10-06: qty 20

## 2021-10-06 MED ORDER — LIDOCAINE-EPINEPHRINE 0.5 %-1:200000 IJ SOLN
INTRAMUSCULAR | Status: AC
  Filled 2021-10-06: qty 1

## 2021-10-06 MED ORDER — BUPIVACAINE HCL (PF) 0.25 % IJ SOLN
INTRAMUSCULAR | Status: AC
  Filled 2021-10-06: qty 30

## 2021-10-06 MED ORDER — TURMERIC CURCUMIN 500 MG PO CAPS: 1.0000 | ORAL_CAPSULE | Freq: Every day | ORAL | Status: DC

## 2021-10-06 MED ORDER — SODIUM CHLORIDE 0.9% FLUSH: 3.0000 mL | INTRAVENOUS | Status: DC | PRN

## 2021-10-06 MED ORDER — HYDROMORPHONE HCL 1 MG/ML IJ SOLN
0.2500 mg | INTRAMUSCULAR | Status: DC | PRN
  Administered 2021-10-06: 0.25 mg via INTRAVENOUS

## 2021-10-06 MED ORDER — SUGAMMADEX SODIUM 200 MG/2ML IV SOLN
INTRAVENOUS | Status: DC | PRN
  Administered 2021-10-06: 400 mg via INTRAVENOUS

## 2021-10-06 MED ORDER — THROMBIN 5000 UNITS EX SOLR
CUTANEOUS | Status: AC
  Filled 2021-10-06: qty 10000

## 2021-10-06 MED ORDER — DEXAMETHASONE SODIUM PHOSPHATE 10 MG/ML IJ SOLN
INTRAMUSCULAR | Status: AC
  Filled 2021-10-06: qty 1

## 2021-10-06 MED ORDER — FENTANYL CITRATE (PF) 250 MCG/5ML IJ SOLN
INTRAMUSCULAR | Status: DC | PRN
Start: 2021-10-06 — End: 2021-10-06
  Administered 2021-10-06: 150 ug via INTRAVENOUS
  Administered 2021-10-06: 50 ug via INTRAVENOUS

## 2021-10-06 MED ORDER — OXYCODONE HCL 5 MG PO TABS
10.0000 mg | ORAL_TABLET | ORAL | Status: DC | PRN
  Administered 2021-10-07: 10 mg via ORAL
  Filled 2021-10-06 (×2): qty 2

## 2021-10-06 MED ORDER — CHLORHEXIDINE GLUCONATE CLOTH 2 % EX PADS: 6.0000 | MEDICATED_PAD | Freq: Once | CUTANEOUS | Status: DC

## 2021-10-06 MED ORDER — CEFAZOLIN IN SODIUM CHLORIDE 3-0.9 GM/100ML-% IV SOLN
3.0000 g | INTRAVENOUS | Status: AC
  Administered 2021-10-06: 3 g via INTRAVENOUS
  Filled 2021-10-06: qty 100

## 2021-10-06 MED ORDER — GABAPENTIN 600 MG PO TABS
600.0000 mg | ORAL_TABLET | Freq: Three times a day (TID) | ORAL | Status: DC
  Administered 2021-10-06 – 2021-10-07 (×3): 600 mg via ORAL
  Filled 2021-10-06 (×3): qty 1

## 2021-10-06 MED ORDER — POTASSIUM CHLORIDE IN NACL 20-0.9 MEQ/L-% IV SOLN: INTRAVENOUS | Status: DC

## 2021-10-06 MED ORDER — ONDANSETRON HCL 4 MG/2ML IJ SOLN: 4.0000 mg | Freq: Four times a day (QID) | INTRAMUSCULAR | Status: DC | PRN

## 2021-10-06 MED ORDER — ACETAMINOPHEN 650 MG RE SUPP: 650.0000 mg | RECTAL | Status: DC | PRN

## 2021-10-06 MED ORDER — ROCURONIUM BROMIDE 10 MG/ML (PF) SYRINGE
PREFILLED_SYRINGE | INTRAVENOUS | Status: AC
  Filled 2021-10-06: qty 20

## 2021-10-06 MED ORDER — CHLORHEXIDINE GLUCONATE 0.12 % MT SOLN
OROMUCOSAL | Status: AC
Start: 2021-10-06 — End: 2021-10-06
  Administered 2021-10-06: 15 mL via OROMUCOSAL
  Filled 2021-10-06: qty 15

## 2021-10-06 MED ORDER — ONDANSETRON HCL 4 MG/2ML IJ SOLN
INTRAMUSCULAR | Status: DC | PRN
  Administered 2021-10-06: 4 mg via INTRAVENOUS

## 2021-10-06 MED ORDER — HEMOSTATIC AGENTS (NO CHARGE) OPTIME
TOPICAL | Status: DC | PRN
  Administered 2021-10-06: 1 via TOPICAL

## 2021-10-06 MED ORDER — MENTHOL 3 MG MT LOZG: 1.0000 | LOZENGE | OROMUCOSAL | Status: DC | PRN

## 2021-10-06 MED ORDER — OXYCODONE HCL 5 MG PO TABS
5.0000 mg | ORAL_TABLET | ORAL | Status: DC | PRN
  Administered 2021-10-07: 5 mg via ORAL
  Filled 2021-10-06: qty 1

## 2021-10-06 MED ORDER — ONDANSETRON HCL 4 MG/2ML IJ SOLN: 4.0000 mg | Freq: Once | INTRAMUSCULAR | Status: DC | PRN

## 2021-10-06 MED ORDER — DEXAMETHASONE SODIUM PHOSPHATE 10 MG/ML IJ SOLN
INTRAMUSCULAR | Status: DC | PRN
  Administered 2021-10-06: 10 mg via INTRAVENOUS

## 2021-10-06 MED ORDER — ROCURONIUM BROMIDE 10 MG/ML (PF) SYRINGE
PREFILLED_SYRINGE | INTRAVENOUS | Status: DC | PRN
Start: 2021-10-06 — End: 2021-10-06
  Administered 2021-10-06: 60 mg via INTRAVENOUS
  Administered 2021-10-06 (×3): 20 mg via INTRAVENOUS

## 2021-10-06 MED ORDER — CEFAZOLIN IN SODIUM CHLORIDE 3-0.9 GM/100ML-% IV SOLN
INTRAVENOUS | Status: AC
  Filled 2021-10-06: qty 100

## 2021-10-06 MED ORDER — THROMBIN 5000 UNITS EX SOLR
CUTANEOUS | Status: DC | PRN
  Administered 2021-10-06 (×2): 5000 [IU] via TOPICAL

## 2021-10-06 MED ORDER — SUCCINYLCHOLINE CHLORIDE 200 MG/10ML IV SOSY
PREFILLED_SYRINGE | INTRAVENOUS | Status: DC | PRN
  Administered 2021-10-06: 100 mg via INTRAVENOUS

## 2021-10-06 MED ORDER — CHLORHEXIDINE GLUCONATE 0.12 % MT SOLN
15.0000 mL | Freq: Once | OROMUCOSAL | Status: AC
  Filled 2021-10-06: qty 15

## 2021-10-06 MED ORDER — SUCCINYLCHOLINE CHLORIDE 200 MG/10ML IV SOSY
PREFILLED_SYRINGE | INTRAVENOUS | Status: AC
  Filled 2021-10-06: qty 10

## 2021-10-06 MED ORDER — PHENOL 1.4 % MT LIQD: 1.0000 | OROMUCOSAL | Status: DC | PRN

## 2021-10-06 MED ORDER — PROPOFOL 10 MG/ML IV BOLUS
INTRAVENOUS | Status: DC | PRN
  Administered 2021-10-06: 150 mg via INTRAVENOUS
  Administered 2021-10-06: 50 mg via INTRAVENOUS

## 2021-10-06 MED ORDER — ACETAMINOPHEN 325 MG PO TABS
650.0000 mg | ORAL_TABLET | ORAL | Status: DC | PRN
  Administered 2021-10-06 – 2021-10-07 (×2): 650 mg via ORAL
  Filled 2021-10-06 (×2): qty 2

## 2021-10-06 MED ORDER — LORATADINE 10 MG PO TABS
10.0000 mg | ORAL_TABLET | Freq: Every day | ORAL | Status: DC
Start: 2021-10-06 — End: 2021-10-07
  Administered 2021-10-06 – 2021-10-07 (×2): 10 mg via ORAL
  Filled 2021-10-06 (×2): qty 1

## 2021-10-06 MED ORDER — FENTANYL CITRATE (PF) 250 MCG/5ML IJ SOLN
INTRAMUSCULAR | Status: AC
  Filled 2021-10-06: qty 5

## 2021-10-06 MED ORDER — 0.9 % SODIUM CHLORIDE (POUR BTL) OPTIME
TOPICAL | Status: DC | PRN
  Administered 2021-10-06: 1000 mL

## 2021-10-06 MED ORDER — ORAL CARE MOUTH RINSE: 15.0000 mL | Freq: Once | OROMUCOSAL | Status: AC

## 2021-10-06 MED ORDER — SODIUM CHLORIDE 0.9% FLUSH
3.0000 mL | Freq: Two times a day (BID) | INTRAVENOUS | Status: DC
  Administered 2021-10-06: 3 mL via INTRAVENOUS

## 2021-10-06 MED ORDER — DIAZEPAM 5 MG PO TABS
5.0000 mg | ORAL_TABLET | Freq: Four times a day (QID) | ORAL | Status: DC | PRN
  Administered 2021-10-06 – 2021-10-07 (×2): 5 mg via ORAL
  Filled 2021-10-06 (×2): qty 1

## 2021-10-06 MED ORDER — OXYCODONE HCL ER 10 MG PO T12A
10.0000 mg | EXTENDED_RELEASE_TABLET | Freq: Two times a day (BID) | ORAL | Status: DC
  Administered 2021-10-06 – 2021-10-07 (×3): 10 mg via ORAL
  Filled 2021-10-06 (×3): qty 1

## 2021-10-06 MED ORDER — LACTATED RINGERS IV SOLN: INTRAVENOUS | Status: DC

## 2021-10-06 MED ORDER — ACETAMINOPHEN 10 MG/ML IV SOLN: 1000.0000 mg | Freq: Once | INTRAVENOUS | Status: DC | PRN

## 2021-10-06 MED ORDER — LIDOCAINE 2% (20 MG/ML) 5 ML SYRINGE
INTRAMUSCULAR | Status: AC
  Filled 2021-10-06: qty 5

## 2021-10-06 MED ORDER — HYDROMORPHONE HCL 1 MG/ML IJ SOLN
INTRAMUSCULAR | Status: AC
  Filled 2021-10-06: qty 1

## 2021-10-06 MED ORDER — MIDAZOLAM HCL 2 MG/2ML IJ SOLN
INTRAMUSCULAR | Status: AC
  Filled 2021-10-06: qty 2

## 2021-10-06 MED ORDER — MEPERIDINE HCL 25 MG/ML IJ SOLN: 6.2500 mg | INTRAMUSCULAR | Status: DC | PRN

## 2021-10-06 MED ORDER — MORPHINE SULFATE (PF) 4 MG/ML IV SOLN: 4.0000 mg | INTRAVENOUS | Status: DC | PRN

## 2021-10-06 MED ORDER — LIDOCAINE 2% (20 MG/ML) 5 ML SYRINGE
INTRAMUSCULAR | Status: DC | PRN
  Administered 2021-10-06: 100 mg via INTRAVENOUS

## 2021-10-06 MED ORDER — ONDANSETRON HCL 4 MG PO TABS: 4.0000 mg | ORAL_TABLET | Freq: Four times a day (QID) | ORAL | Status: DC | PRN

## 2021-10-06 MED ORDER — ONDANSETRON HCL 4 MG/2ML IJ SOLN
INTRAMUSCULAR | Status: AC
  Filled 2021-10-06: qty 2

## 2021-10-06 MED ORDER — SODIUM CHLORIDE 0.9 % IV SOLN: 250.0000 mL | INTRAVENOUS | Status: DC

## 2021-10-06 MED ORDER — MIDAZOLAM HCL 2 MG/2ML IJ SOLN
INTRAMUSCULAR | Status: DC | PRN
  Administered 2021-10-06: 2 mg via INTRAVENOUS

## 2021-10-06 SURGICAL SUPPLY — 51 items
BAG COUNTER SPONGE SURGICOUNT (BAG) ×2 IMPLANT
BAND RUBBER #18 3X1/16 STRL (MISCELLANEOUS) ×4 IMPLANT
BENZOIN TINCTURE PRP APPL 2/3 (GAUZE/BANDAGES/DRESSINGS) IMPLANT
BLADE CLIPPER SURG (BLADE) IMPLANT
BUR MATCHSTICK NEURO 3.0 LAGG (BURR) ×2 IMPLANT
BUR PRECISION FLUTE 5.0 (BURR) IMPLANT
CANISTER SUCT 3000ML PPV (MISCELLANEOUS) ×2 IMPLANT
CARTRIDGE OIL MAESTRO DRILL (MISCELLANEOUS) ×1 IMPLANT
DECANTER SPIKE VIAL GLASS SM (MISCELLANEOUS) ×2 IMPLANT
DERMABOND ADVANCED (GAUZE/BANDAGES/DRESSINGS) ×1
DERMABOND ADVANCED .7 DNX12 (GAUZE/BANDAGES/DRESSINGS) ×1 IMPLANT
DIFFUSER DRILL AIR PNEUMATIC (MISCELLANEOUS) ×2 IMPLANT
DRAPE C-ARM 42X72 X-RAY (DRAPES) ×2 IMPLANT
DRAPE LAPAROTOMY 100X72X124 (DRAPES) ×2 IMPLANT
DRAPE MICROSCOPE LEICA (MISCELLANEOUS) ×2 IMPLANT
DRAPE SURG 17X23 STRL (DRAPES) ×2 IMPLANT
DRSG OPSITE POSTOP 4X6 (GAUZE/BANDAGES/DRESSINGS) ×1 IMPLANT
DURAPREP 26ML APPLICATOR (WOUND CARE) ×2 IMPLANT
ELECT BLADE 4.0 EZ CLEAN MEGAD (MISCELLANEOUS) ×2
ELECT REM PT RETURN 9FT ADLT (ELECTROSURGICAL) ×2
ELECTRODE BLDE 4.0 EZ CLN MEGD (MISCELLANEOUS) IMPLANT
ELECTRODE REM PT RTRN 9FT ADLT (ELECTROSURGICAL) ×1 IMPLANT
GAUZE 4X4 16PLY ~~LOC~~+RFID DBL (SPONGE) IMPLANT
GAUZE SPONGE 4X4 12PLY STRL (GAUZE/BANDAGES/DRESSINGS) IMPLANT
GLOVE EXAM NITRILE XL STR (GLOVE) IMPLANT
GLOVE SURG LTX SZ6.5 (GLOVE) ×2 IMPLANT
GOWN STRL REUS W/ TWL LRG LVL3 (GOWN DISPOSABLE) ×2 IMPLANT
GOWN STRL REUS W/ TWL XL LVL3 (GOWN DISPOSABLE) IMPLANT
GOWN STRL REUS W/TWL 2XL LVL3 (GOWN DISPOSABLE) IMPLANT
GOWN STRL REUS W/TWL LRG LVL3 (GOWN DISPOSABLE) ×2
GOWN STRL REUS W/TWL XL LVL3 (GOWN DISPOSABLE)
KIT BASIN OR (CUSTOM PROCEDURE TRAY) ×2 IMPLANT
KIT TURNOVER KIT B (KITS) ×2 IMPLANT
NDL HYPO 25X1 1.5 SAFETY (NEEDLE) ×1 IMPLANT
NDL SPNL 18GX3.5 QUINCKE PK (NEEDLE) IMPLANT
NEEDLE HYPO 25X1 1.5 SAFETY (NEEDLE) ×2 IMPLANT
NEEDLE SPNL 18GX3.5 QUINCKE PK (NEEDLE) IMPLANT
NS IRRIG 1000ML POUR BTL (IV SOLUTION) ×2 IMPLANT
OIL CARTRIDGE MAESTRO DRILL (MISCELLANEOUS) ×2
PACK LAMINECTOMY NEURO (CUSTOM PROCEDURE TRAY) ×2 IMPLANT
PAD ARMBOARD 7.5X6 YLW CONV (MISCELLANEOUS) ×6 IMPLANT
SPONGE SURGIFOAM ABS GEL SZ50 (HEMOSTASIS) ×2 IMPLANT
SPONGE T-LAP 4X18 ~~LOC~~+RFID (SPONGE) IMPLANT
STRIP CLOSURE SKIN 1/2X4 (GAUZE/BANDAGES/DRESSINGS) IMPLANT
SUT VIC AB 0 CT1 18XCR BRD8 (SUTURE) ×1 IMPLANT
SUT VIC AB 0 CT1 8-18 (SUTURE) ×1
SUT VIC AB 2-0 CT1 18 (SUTURE) ×2 IMPLANT
SUT VIC AB 3-0 SH 8-18 (SUTURE) ×2 IMPLANT
TOWEL GREEN STERILE (TOWEL DISPOSABLE) ×2 IMPLANT
TOWEL GREEN STERILE FF (TOWEL DISPOSABLE) ×2 IMPLANT
WATER STERILE IRR 1000ML POUR (IV SOLUTION) ×2 IMPLANT

## 2021-10-06 NOTE — H&P (Signed)
BP 122/80    Pulse 96    Temp 98.9 F (37.2 C) (Oral)    Resp 18    Ht 5\' 9"  (1.753 m)    Wt (!) 156 kg    SpO2 97%    BMI 50.80 kg/m  Travis Fitzpatrick presents with right lower extremity pain. MRi shows a small disc at L4/5, and lateral recess stenosis at L5/S1. Despite conservative treatment his situation has not improved.  Allergies  Allergen Reactions   Aspirin Rash    Other reaction(s): Hives/Skin Rash   Iodine Nausea Only   Other Itching and Other (See Comments)    Apples,oranges, grapes (skin on outside)-  throat itches   Past Medical History:  Diagnosis Date   Bowel trouble    urgency   Medical history non-contributory    Urinary urgency    Past Surgical History:  Procedure Laterality Date   BACK SURGERY  2010   CIRCUMCISION     LUMBAR LAMINECTOMY/DECOMPRESSION MICRODISCECTOMY  07/20/2011   Procedure: LUMBAR LAMINECTOMY/DECOMPRESSION MICRODISCECTOMY;  Surgeon: 14/02/2011;  Location: [facility];  Service: Neurosurgery;  Laterality: N/A;  right thoracotomy with thoracic eight-nine discectomy and fusion   THORACIC DISCECTOMY  07/16/2012   Procedure: THORACIC DISCECTOMY;  Surgeon: 14/11/2011, MD;  Location: [facility];  Service: Neurosurgery;  Laterality: Right;  RIGHT Thoracic seven-eight  thoracic diskectomy via thoracotomy by dr Carmela Hurt   THORACIC DISCECTOMY N/A 12/15/2014   Procedure: THORACIC SEVEN TO THORACIC NINE Laminectomy ;  Surgeon: 02/14/2015, MD;  Location: [facility];  Service: Neurosurgery;  Laterality: N/A;   THORACIC DISCECTOMY N/A 06/06/2017   Procedure: LAMINECTOMY THORACIC NINE-TEN;  Surgeon: 06/08/2017, MD;  Location: [facility];  Service: Neurosurgery;  Laterality: N/A;  LAMINECTOMY THORACIC NINE-TEN   THORACOTOMY  07/20/2011   Procedure: THORACOTOMY OPEN FOR SPINE SURGERY;  Surgeon: 14/02/2011, MD;  Location: [facility];  Service: Vascular;  Laterality: N/A;   THORACOTOMY  07/16/2012   Procedure: THORACOTOMY OPEN FOR SPINE SURGERY;  Surgeon:  14/11/2011, MD;  Location: [facility];  Service: Thoracic;  Laterality: N/A;   Family History  Problem Relation Age of Onset   Hypertension Father    Social History   Socioeconomic History   Marital status: Single    Spouse name: Not on file   Number of children: Not on file   Years of education: Not on file   Highest education level: Not on file  Occupational History   Not on file  Tobacco Use   Smoking status: Never   Smokeless tobacco: Never  Vaping Use   Vaping Use: Never used  Substance and Sexual Activity   Alcohol use: No    Alcohol/week: 0.0 standard drinks   Drug use: No   Sexual activity: Never  Other Topics Concern   Not on file  Social History Narrative   Not on file   Social Determinants of Health   Financial Resource Strain: Not on file  Food Insecurity: Not on file  Transportation Needs: Not on file  Physical Activity: Not on file  Stress: Not on file  Social Connections: Not on file  Intimate Partner Violence: Not on file   Prior to Admission medications   Medication Sig Start Date End Date Taking? Authorizing Provider  diclofenac (VOLTAREN) 75 MG EC tablet Take 1 tablet (75 mg total) by mouth 2 (two) times daily. 06/28/21  Yes 06/30/21, MD  gabapentin (NEURONTIN) 600 MG tablet  Take 1 tablet (600 mg total) by mouth 2 (two) times daily. Patient taking differently: Take 600 mg by mouth 3 (three) times daily. 07/24/21  Yes Ranelle Oyster, MD  loratadine (CLARITIN) 10 MG tablet Take 10 mg by mouth daily.   Yes [provider]  Turmeric Curcumin 500 MG CAPS Take 1 tablet by mouth daily. Ginger   Yes [provider]   Physical Exam Constitutional:      Appearance: Normal appearance. He is obese.  HENT:     Head: Normocephalic and atraumatic.     Right Ear: Ear canal and external ear normal.     Left Ear: Ear canal and external ear normal.     Nose: Nose normal.     Mouth/Throat:     Mouth: Mucous membranes are  moist.     Pharynx: Oropharynx is clear.  Eyes:     Extraocular Movements: Extraocular movements intact.     Conjunctiva/sclera: Conjunctivae normal.     Pupils: Pupils are equal, round, and reactive to light.  Cardiovascular:     Rate and Rhythm: Normal rate and regular rhythm.  Pulmonary:     Effort: Pulmonary effort is normal.     Breath sounds: Normal breath sounds.  Abdominal:     Palpations: Abdomen is soft.  Musculoskeletal:     Cervical back: Normal range of motion.  Neurological:     Mental Status: He is alert and oriented to person, place, and time.     Cranial Nerves: Cranial nerves 2-12 are intact.     Motor: Weakness and abnormal muscle tone present.     Comments: Thoracic myelopathy, spastic gait Poor coordination lower extremities    Admit for OR and discetomy and foraminotomy L4/5,5/1. 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 Risks and benefits including but not limited to bleeding, infection, paralysis, weakness in one or both extremities, bowel and/or bladder dysfunction, need for further surgery, no relief of pain. Travis Fitzpatrick understands and wishes to proceed.

## 2021-10-06 NOTE — Anesthesia Preprocedure Evaluation (Addendum)
Anesthesia Evaluation  Patient identified by MRN, date of birth, ID band Patient awake    Reviewed: Allergy & Precautions, NPO status , Patient's Chart, lab work & pertinent test results  Airway Mallampati: II  TM Distance: >3 FB     Dental no notable dental hx.    Pulmonary neg pulmonary ROS,    breath sounds clear to auscultation       Cardiovascular negative cardio ROS Normal cardiovascular exam Rhythm:Regular Rate:Normal     Neuro/Psych    GI/Hepatic negative GI ROS, Neg liver ROS,   Endo/Other  Morbid obesity  Renal/GU negative Renal ROS     Musculoskeletal   Abdominal (+) + obese,   Peds  Hematology  (+) Blood dyscrasia, anemia ,   Anesthesia Other Findings   Reproductive/Obstetrics                             Anesthesia Physical  Anesthesia Plan  ASA: 3  Anesthesia Plan: General   Post-op Pain Management:    Induction: Intravenous  PONV Risk Score and Plan: 2 and Ondansetron, Dexamethasone, Treatment may vary due to age or medical condition and Midazolam  Airway Management Planned: Oral ETT  Additional Equipment: None  Intra-op Plan:   Post-operative Plan: Extubation in OR  Informed Consent: I have reviewed the patients History and Physical, chart, labs and discussed the procedure including the risks, benefits and alternatives for the proposed anesthesia with the patient or authorized representative who has indicated his/her understanding and acceptance.     Dental advisory given  Plan Discussed with: CRNA  Anesthesia Plan Comments:        Anesthesia Quick Evaluation

## 2021-10-06 NOTE — Op Note (Signed)
10/06/2021  8:35 PM  PATIENT:  Travis Fitzpatrick  42 y.o. 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  BLOOD ADMINISTERED:none  CELL SAVER GIVEN:none used  COUNT:per nursing  DRAINS: none   SPECIMEN:  No Specimen  DICTATION: Mr. [name] was taken to the operating room, intubated and placed under a general anesthetic without difficulty. He was positioned prone on a Jackson tablewith all pressure points padded. His back was prepped and draped in a sterile manner. I opened the skin with a 10 blade and carried the dissection down to the thoracolumbar fascia. I used both sharp dissection and the monopolar cautery to expose the lamina of L4, and L5. I confirmed my location with an intraoperative xray.  I used the drill, Kerrison punches, and curettes to perform a semihemilaminectomy of L4. I used the punches to remove the ligamentum flavum to expose the thecal sac. I brought the microscope into the operative field and with Dr.Nundkumar's assistance we started our decompression of the spinal canal, thecal sac and L4 and 5 root(s). I cauterized epidural veins overlying the disc space then divided them sharply. I opened the disc space with a 15 blade and proceeded with the discectomy. I used pituitary rongeurs, curettes, and other instruments to remove disc material. After the discectomy was completed We inspected the 4,5 nerve roots and felt they were well decompressed. I explored rostrally, laterally, medially, and caudally and was satisfied with the decompression. I irrigated the wound, then closed in layers. I approximated the thoracolumbar fascia, subcutaneous, and  subcuticular planes with vicryl sutures. I used dermabond for a sterile dressing.   PLAN OF CARE: Admit for overnight observation  PATIENT DISPOSITION:  PACU - hemodynamically stable.   Delay start of Pharmacological VTE agent (>24hrs) due to surgical blood loss or risk of bleeding:  no

## 2021-10-06 NOTE — Transfer of Care (Signed)
Immediate Anesthesia Transfer of Care Note  Patient: Travis Fitzpatrick  Procedure(s) Performed: Right Lumbar Four-Five Microdiscectomy, Right Lumbar Five-Sacal One Foraminotomy (Right: Spine Cervical)  Patient Location: PACU  Anesthesia Type:General  Level of Consciousness: drowsy and patient cooperative  Airway & Oxygen Therapy: Patient Spontanous Breathing and Patient connected to face mask oxygen  Post-op Assessment: Report given to RN and Post -op Vital signs reviewed and stable  Post vital signs: Reviewed and stable  Last Vitals:  Vitals Value Taken Time  BP 124/80 10/06/21 1232  Temp    Pulse 95 10/06/21 1233  Resp 23 10/06/21 1233  SpO2 92 % 10/06/21 1233  Vitals shown include unvalidated device data.  Last Pain:  Vitals:   10/06/21 0747  TempSrc:   PainSc: 7       Patients Stated Pain Goal: 2 (10/06/21 0747)  Complications: No notable events documented.

## 2021-10-06 NOTE — Anesthesia Procedure Notes (Signed)
Procedure Name: Intubation Date/Time: 10/06/2021 9:15 AM Performed by: Thelma Comp, CRNA Pre-anesthesia Checklist: Patient identified, Emergency Drugs available, Suction available and Patient being monitored Patient Re-evaluated:Patient Re-evaluated prior to induction Oxygen Delivery Method: Circle System Utilized Preoxygenation: Pre-oxygenation with 100% oxygen Induction Type: IV induction Ventilation: Two handed mask ventilation required and Oral airway inserted - appropriate to patient size Laryngoscope Size: Glidescope and 4 Grade View: Grade I Tube type: Oral Tube size: 7.5 mm Number of attempts: 2 Airway Equipment and Method: Stylet and Oral airway Placement Confirmation: ETT inserted through vocal cords under direct vision, positive ETCO2 and breath sounds checked- equal and bilateral Secured at: 24 cm Tube secured with: Tape Dental Injury: Teeth and Oropharynx as per pre-operative assessment  Comments: DL with MAC 4, grade 3 view, glide used, grade 1.

## 2021-10-07 DIAGNOSIS — M5126 Other intervertebral disc displacement, lumbar region: Secondary | ICD-10-CM | POA: Diagnosis not present

## 2021-10-07 MED ORDER — DIAZEPAM 5 MG PO TABS: 5.0000 mg | ORAL_TABLET | Freq: Four times a day (QID) | ORAL | 0 refills | Status: DC | PRN

## 2021-10-07 MED ORDER — OXYCODONE HCL ER 10 MG PO T12A: 10.0000 mg | EXTENDED_RELEASE_TABLET | Freq: Two times a day (BID) | ORAL | 0 refills | Status: DC

## 2021-10-07 MED ORDER — OXYCODONE HCL 5 MG PO TABS: 5.0000 mg | ORAL_TABLET | ORAL | 0 refills | Status: DC | PRN

## 2021-10-07 NOTE — Anesthesia Postprocedure Evaluation (Signed)
Anesthesia Post Note  Patient: Travis Fitzpatrick  Procedure(s) Performed: Right Lumbar Four-Five Microdiscectomy, Right Lumbar Five-Sacal One Foraminotomy (Right: Spine Cervical)     Patient location during evaluation: PACU Anesthesia Type: General Level of consciousness: awake and sedated Pain management: pain level controlled Vital Signs Assessment: post-procedure vital signs reviewed and stable Respiratory status: spontaneous breathing Cardiovascular status: stable Postop Assessment: no apparent nausea or vomiting Anesthetic complications: no   No notable events documented.  Last Vitals:  Vitals:   10/07/21 0435 10/07/21 0900  BP: 116/66 128/74  Pulse: 87 94  Resp: 20 18  Temp: 36.7 C 36.9 C  SpO2: 97% 100%    Last Pain:  Vitals:   10/07/21 0958  TempSrc:   PainSc: 4                  John F Scharlene Corn

## 2021-10-07 NOTE — Evaluation (Addendum)
Physical Therapy Evaluation Patient Details Name: ESKEL BROTHERSON MRN: [id number] DOB: 11-14-1980 Today's Date: 10/07/2021  History of Present Illness  41 y/o male admitted on 10/06/21 following R L4-5 microdiscectomy. PMH: hx of back surgery  Clinical Impression  Patient admitted following above procedure. Patient presents with generalized weakness, impaired coordination, decreased activity tolerance, and impaired balance. Patient reporting 8 falls in past 3 months due to LE pain and weakness. Educated patient on back precautions and progressive walking program, patient verbalized understanding. Patient ambulated hallway distance with RW and min guard for safety but demos mild R knee buckling. Able to negotiate 3 stairs with minA and rails to safely access home. Patient will have girlfriend present to assist for 2 weeks. Patient will benefit from skilled PT services during acute stay to address listed deficits. Recommend OPPT to address strength and balance deficits.        Recommendations for follow up therapy are one component of a multi-disciplinary discharge planning process, led by the attending physician.  Recommendations may be updated based on patient status, additional functional criteria and insurance authorization.  Follow Up Recommendations Outpatient PT    Assistance Recommended at Discharge Frequent or constant Supervision/Assistance  Patient can return home with the following       Equipment Recommendations None recommended by PT (patient owns necessary equipment)  Recommendations for Other Services       Functional Status Assessment Patient has had a recent decline in their functional status and demonstrates the ability to make significant improvements in function in a reasonable and predictable amount of time.     Precautions / Restrictions Precautions Precautions: Back;Fall Precaution Booklet Issued: Yes (comment) Required Braces or Orthoses:  (no brace  needed) Restrictions Weight Bearing Restrictions: No      Mobility  Bed Mobility Overal bed mobility: Modified Independent             General bed mobility comments: instructed on log roll technique    Transfers Overall transfer level: Needs assistance Equipment used: Rolling Myisha Pickerel (2 wheels) Transfers: Sit to/from Stand Sit to Stand: Min assist           General transfer comment: minA to boost up into standing    Ambulation/Gait Ambulation/Gait assistance: Min guard Gait Distance (Feet): 250 Feet Assistive device: Rolling Sherlon Nied (2 wheels) Gait Pattern/deviations: Step-through pattern, Decreased stride length, Knees buckling, Trunk flexed Gait velocity: decreased     General Gait Details: mild R knee buckling with mobility but no overt LOB. Min guard for safety  Stairs Stairs: Yes Stairs assistance: Min assist Stair Management: Two rails, Step to pattern, Forwards Number of Stairs: 3 General stair comments: minA for balance. Instructed on up with good and down with bad  Wheelchair Mobility    Modified Rankin (Stroke Patients Only)       Balance Overall balance assessment: Needs assistance Sitting-balance support: No upper extremity supported, Feet supported Sitting balance-Leahy Scale: Good     Standing balance support: Bilateral upper extremity supported, Reliant on assistive device for balance Standing balance-Leahy Scale: Poor Standing balance comment: reliant on RW for support                             Pertinent Vitals/Pain Pain Assessment Pain Assessment: 0-10 Pain Score: 3  Pain Location: R LE Pain Descriptors / Indicators: Discomfort, Grimacing Pain Intervention(s): Monitored during session, Repositioned    Home Living Family/patient expects to be discharged to:: Private residence Living  Arrangements: Spouse/significant other Available Help at Discharge: Family Type of Home: House Home Access: Stairs to  enter Entrance Stairs-Rails: Left Entrance Stairs-Number of Steps: 3     Home Equipment: Conservation officer, nature (2 wheels);Rollator (4 wheels);Cane - single point      Prior Function Prior Level of Function : Independent/Modified Independent;Driving;History of Falls (last six months)             Mobility Comments: 8 falls in the past 3 months, uses rollator for mobility and cane intermittently       Hand Dominance        Extremity/Trunk Assessment   Upper Extremity Assessment Upper Extremity Assessment: Defer to OT evaluation    Lower Extremity Assessment Lower Extremity Assessment: Generalized weakness (unable to complete heel to shin due to weakness)    Cervical / Trunk Assessment Cervical / Trunk Assessment: Back Surgery  Communication   Communication: No difficulties  Cognition Arousal/Alertness: Awake/alert Behavior During Therapy: WFL for tasks assessed/performed Overall Cognitive Status: Within Functional Limits for tasks assessed                                          General Comments      Exercises     Assessment/Plan    PT Assessment Patient needs continued PT services  PT Problem List Decreased strength;Decreased activity tolerance;Decreased mobility;Decreased balance;Decreased coordination       PT Treatment Interventions DME instruction;Gait training;Stair training;Functional mobility training;Therapeutic activities;Therapeutic exercise;Balance training;Patient/family education    PT Goals (Current goals can be found in the Care Plan section)  Acute Rehab PT Goals Patient Stated Goal: to go home and be able to pick up my child PT Goal Formulation: With patient Time For Goal Achievement: 10/21/21 Potential to Achieve Goals: Good    Frequency Min 5X/week     Co-evaluation               AM-PAC PT "6 Clicks" Mobility  Outcome Measure Help needed turning from your back to your side while in a flat bed without using  bedrails?: A Little Help needed moving from lying on your back to sitting on the side of a flat bed without using bedrails?: A Little Help needed moving to and from a bed to a chair (including a wheelchair)?: A Little Help needed standing up from a chair using your arms (e.g., wheelchair or bedside chair)?: A Little Help needed to walk in hospital room?: A Little Help needed climbing 3-5 steps with a railing? : A Little 6 Click Score: 18    End of Session Equipment Utilized During Treatment: Gait belt Activity Tolerance: Patient tolerated treatment well Patient left: in bed;with call bell/phone within reach Nurse Communication: Mobility status PT Visit Diagnosis: Unsteadiness on feet (R26.81);Muscle weakness (generalized) (M62.81);History of falling (Z91.81)    Time: [id number] PT Time Calculation (min) (ACUTE ONLY): 25 min   Charges:   PT Evaluation $PT Eval Moderate Complexity: 1 Mod PT Treatments $Gait Training: 8-22 mins        Shadie Sweatman A. Gilford Rile PT, DPT Acute Rehabilitation Services Pager [phone] Office [phone]   Linna Hoff 10/07/2021, 9:41 AM

## 2021-10-07 NOTE — Care Management (Signed)
Notified by RN to schedule outpatient PT per Dr Christella Noa. Referral placed and entered on AVS. Unit staff to provide any ambulatory DME needed. No other TOC needs identified

## 2021-10-07 NOTE — Evaluation (Signed)
Occupational Therapy Evaluation Patient Details Name: Travis Fitzpatrick MRN: [id number] DOB: 01-03-1981 Today's Date: 10/07/2021   History of Present Illness 41 y/o male admitted on 10/06/21 following R L4-5 microdiscectomy. PMH: hx of back surgery   Clinical Impression   Travis Fitzpatrick was evaluated s/p the above back surgery, he is generally mod I at baseline with use of AD and AE as needed, and has had 8 falls recently. He lives with his spouse and young child with STE and 24/7 assist for the first 2 weeks after d/c. After review, pt verbalized understanding of all back precautions and demonstrated good use of AE to maintain precautions during ADLs. He requires close min guard for functional mobility with AD limited by pain and weakness in RLE. Pt has active d/c plans for home today, no OT follow up recommended.      Recommendations for follow up therapy are one component of a multi-disciplinary discharge planning process, led by the attending physician.  Recommendations may be updated based on patient status, additional functional criteria and insurance authorization.   Follow Up Recommendations  No OT follow up    Assistance Recommended at Discharge Frequent or constant Supervision/Assistance  Patient can return home with the following A little help with walking and/or transfers;A little help with bathing/dressing/bathroom;Assist for transportation;Help with stairs or ramp for entrance    Functional Status Assessment  Patient has had a recent decline in their functional status and demonstrates the ability to make significant improvements in function in a reasonable and predictable amount of time.  Equipment Recommendations  BSC/3in1       Precautions / Restrictions Precautions Precautions: Back;Fall Precaution Booklet Issued: Yes (comment) Required Braces or Orthoses:  (no brace needed) Restrictions Weight Bearing Restrictions: No      Mobility Bed Mobility Overal bed mobility: Needs  Assistance             General bed mobility comments: pt sitting EOB upon arrival verbally reviewed log roll    Transfers Overall transfer level: Needs assistance Equipment used: Rolling walker (2 wheels) Transfers: Sit to/from Stand Sit to Stand: Supervision           General transfer comment: with incr time and effort      Balance Overall balance assessment: Needs assistance Sitting-balance support: No upper extremity supported, Feet supported Sitting balance-Leahy Scale: Good     Standing balance support: Single extremity supported, During functional activity Standing balance-Leahy Scale: Fair Standing balance comment: can stand statically with 1 UE supported           ADL either performed or assessed with clinical judgement   ADL Overall ADL's : Needs assistance/impaired Eating/Feeding: Independent;Sitting   Grooming: Min guard;Standing Grooming Details (indicate cue type and reason): cues for two cups wtih oral hygiene Upper Body Bathing: Set up;Sitting   Lower Body Bathing: Minimal assistance;Bed level;Sit to/from stand;Cueing for compensatory techniques;Cueing for back precautions   Upper Body Dressing : Set up;Sitting   Lower Body Dressing: Minimal assistance;Sit to/from stand;Cueing for compensatory techniques;Cueing for back precautions;With adaptive equipment   Toilet Transfer: Min guard;Ambulation;Rolling walker (2 wheels)   Toileting- Clothing Manipulation and Hygiene: Supervision/safety;Sitting/lateral lean       Functional mobility during ADLs: Min guard;Rolling walker (2 wheels) General ADL Comments: limited by back precautions, pain and weakness. pt unable to obtain figure four position, demosntrated good understanding of AE     Vision Baseline Vision/History: 0 No visual deficits Ability to See in Adequate Light: 0 Adequate Patient Visual Report: No  change from baseline Vision Assessment?: No apparent visual deficits             Pertinent Vitals/Pain Pain Assessment Pain Assessment: Faces Pain Score: 4  Pain Location: R LE Pain Descriptors / Indicators: Discomfort, Grimacing Pain Intervention(s): Limited activity within patient's tolerance, Monitored during session        Extremity/Trunk Assessment Upper Extremity Assessment Upper Extremity Assessment: Overall WFL for tasks assessed   Lower Extremity Assessment Lower Extremity Assessment: Defer to PT evaluation   Cervical / Trunk Assessment Cervical / Trunk Assessment: Back Surgery   Communication Communication Communication: No difficulties   Cognition Arousal/Alertness: Awake/alert Behavior During Therapy: WFL for tasks assessed/performed Overall Cognitive Status: Within Functional Limits for tasks assessed                                       General Comments  VSS on RA     Home Living Family/patient expects to be discharged to:: Private residence Living Arrangements: Spouse/significant other Available Help at Discharge: Family Type of Home: House Home Access: Stairs to enter Technical brewer of Steps: 3 Entrance Stairs-Rails: Left       Bathroom Shower/Tub: Teacher, early years/pre: Standard     Home Equipment: Conservation officer, nature (2 wheels);Rollator (4 wheels);Cane - single point          Prior Functioning/Environment Prior Level of Function : Independent/Modified Independent;Driving;History of Falls (last six months)             Mobility Comments: 8 falls in the past 3 months, uses rollator for mobility and cane intermittently ADLs Comments: uses reacher as needed, otherwise mod I        OT Problem List: Decreased strength;Decreased range of motion;Decreased activity tolerance;Decreased knowledge of use of DME or AE;Decreased knowledge of precautions;Pain      OT Treatment/Interventions:      OT Goals(Current goals can be found in the care plan section) Acute Rehab OT Goals Patient  Stated Goal: home OT Goal Formulation: All assessment and education complete, DC therapy Time For Goal Achievement: 10/07/21 Potential to Achieve Goals: Good   AM-PAC OT "6 Clicks" Daily Activity     Outcome Measure Help from another person eating meals?: None Help from another person taking care of personal grooming?: A Little Help from another person toileting, which includes using toliet, bedpan, or urinal?: A Little Help from another person bathing (including washing, rinsing, drying)?: A Little Help from another person to put on and taking off regular upper body clothing?: None Help from another person to put on and taking off regular lower body clothing?: A Little 6 Click Score: 20   End of Session Equipment Utilized During Treatment: Rolling walker (2 wheels) Nurse Communication: Mobility status  Activity Tolerance: Patient tolerated treatment well Patient left: in bed  OT Visit Diagnosis: Unsteadiness on feet (R26.81);Other abnormalities of gait and mobility (R26.89);Repeated falls (R29.6);Pain                Time: [id number] OT Time Calculation (min): 22 min Charges:  OT General Charges $OT Visit: 1 Visit OT Evaluation $OT Eval Low Complexity: 1 Low   Alex Leahy A Paras Kreider 10/07/2021, 10:35 AM

## 2021-10-07 NOTE — Progress Notes (Signed)
Patient is discharged from room 3C11 at this time. Alert and in stable condition. IV site d/c'd and instructions read to patient with understanding verbalized and all questions answered. Bedside commode given to patient. Transported via wheelchair with all belongings at side.

## 2021-10-07 NOTE — Discharge Summary (Signed)
Physician Discharge Summary  Patient ID: Travis Fitzpatrick MRN: [id number] DOB/AGE: April 09, 1981 41 y.o.  Admit date: 10/06/2021 Discharge date: 10/07/2021  Admission Diagnoses: Lumbar disc herniation with radiculopathy  Discharge Diagnoses: Same Principal Problem:   HNP (herniated nucleus pulposus), lumbar   Discharged Condition: Stable  Hospital Course:  [name] is a 41 y.o. male admitted for elective laminotomy and microdiscectomy which was done without complication. On POD#1 the patient was at neurologic baseline, reporting improvement of right leg pain. Back pain was controlled with oral medication, he was ambulating without difficulty, voiding normally, and tolerating diet.  Treatments: Surgery - Right L4-5 laminotomy, microdiscectomy  Discharge Exam: Blood pressure 128/74, pulse 94, temperature 98.4 F (36.9 C), temperature source Oral, resp. rate 18, height 5\' 9"  (1.753 m), weight (!) 156 kg, SpO2 100 %. Awake, alert, oriented Speech fluent, appropriate CN grossly intact 5/5 BUE/BLE Wound c/d/i  Follow-up: Follow-up in office [facility] [phone]) in 2-3 weeks  Disposition: Discharge disposition: 01-Home or Self Care       Discharge Instructions     Call MD for:  redness, tenderness, or signs of infection (pain, swelling, redness, odor or green/yellow discharge around incision site)   Complete by: As directed    Call MD for:  temperature >100.4   Complete by: As directed    Diet - low sodium heart healthy   Complete by: As directed    Discharge instructions   Complete by: As directed    Walk at home as much as possible, at least 4 times / day   Increase activity slowly   Complete by: As directed    Lifting restrictions   Complete by: As directed    No lifting > 10 lbs   May shower / Bathe   Complete by: As directed    48 hours after surgery   May walk up steps   Complete by: As directed    Other Restrictions    Complete by: As directed    No bending/twisting at waist   Remove dressing in 24 hours   Complete by: As directed       Allergies as of 10/07/2021       Reactions   Aspirin Rash   Other reaction(s): Hives/Skin Rash   Lactose Intolerance (gi) Other (See Comments)   UPSET STOMACH   Iodine Nausea Only   Other Itching, Other (See Comments)   Apples,oranges, grapes (skin on outside)-  throat itches        Medication List     TAKE these medications    diazepam 5 MG tablet Commonly known as: VALIUM Take 1 tablet (5 mg total) by mouth every 6 (six) hours as needed for muscle spasms.   diclofenac 75 MG EC tablet Commonly known as: VOLTAREN Take 1 tablet (75 mg total) by mouth 2 (two) times daily.   gabapentin 600 MG tablet Commonly known as: NEURONTIN Take 1 tablet (600 mg total) by mouth 2 (two) times daily. What changed: when to take this   loratadine 10 MG tablet Commonly known as: CLARITIN Take 10 mg by mouth daily.   oxyCODONE 10 mg 12 hr tablet Commonly known as: OXYCONTIN Take 1 tablet (10 mg total) by mouth every 12 (twelve) hours.   oxyCODONE 5 MG immediate release tablet Commonly known as: Oxy IR/ROXICODONE Take 1 tablet (5 mg total) by mouth every 3 (three) hours as needed for moderate pain ((score 4 to 6)).   Turmeric Curcumin 500 MG  Caps Take 1 tablet by mouth daily. Ginger         Signed: Jairo Ben 10/07/2021, 10:03 AM

## 2021-10-08 ENCOUNTER — Encounter (HOSPITAL_COMMUNITY): Payer: Self-pay | Admitting: Neurosurgery

## 2021-10-25 ENCOUNTER — Encounter: Payer: Medicare Other | Admitting: Physical Medicine & Rehabilitation

## 2021-11-01 ENCOUNTER — Ambulatory Visit: Payer: Medicare Other | Attending: Neurosurgery | Admitting: Physical Therapy

## 2021-11-01 ENCOUNTER — Encounter: Payer: Self-pay | Admitting: Physical Therapy

## 2021-11-01 DIAGNOSIS — M5442 Lumbago with sciatica, left side: Secondary | ICD-10-CM | POA: Insufficient documentation

## 2021-11-01 DIAGNOSIS — G8929 Other chronic pain: Secondary | ICD-10-CM | POA: Diagnosis present

## 2021-11-01 DIAGNOSIS — R296 Repeated falls: Secondary | ICD-10-CM

## 2021-11-01 DIAGNOSIS — M5104 Intervertebral disc disorders with myelopathy, thoracic region: Secondary | ICD-10-CM | POA: Diagnosis not present

## 2021-11-01 DIAGNOSIS — M546 Pain in thoracic spine: Secondary | ICD-10-CM | POA: Diagnosis present

## 2021-11-01 DIAGNOSIS — M6281 Muscle weakness (generalized): Secondary | ICD-10-CM

## 2021-11-01 DIAGNOSIS — M5441 Lumbago with sciatica, right side: Secondary | ICD-10-CM | POA: Insufficient documentation

## 2021-11-01 DIAGNOSIS — R2689 Other abnormalities of gait and mobility: Secondary | ICD-10-CM

## 2021-11-01 DIAGNOSIS — R262 Difficulty in walking, not elsewhere classified: Secondary | ICD-10-CM

## 2021-11-01 NOTE — Therapy (Addendum)
?OUTPATIENT PHYSICAL THERAPY THORACOLUMBAR EVALUATION ? ? ?Patient Name: Travis Fitzpatrick ?MRN: [id number] ?DOB:Sep 10, 1980, 41 y.o., male ?Today's Date: 11/01/2021 ? ? PT End of Session - 11/01/21 1453   ? ? Visit Number 1   ? Number of Visits 24   ? Date for PT Re-Evaluation 01/24/22   ? Authorization Type MEDICARE PART B reporting period from 11/01/2021   ? Progress Note Due on Visit 10   ? PT Start Time 1120   ? PT Stop Time 1200   ? PT Time Calculation (min) 40 min   ? Activity Tolerance Patient tolerated treatment well   ? Behavior During Therapy [facility] for tasks assessed/performed   ? ?  ?  ? ?  ? ? ?Past Medical History:  ?Diagnosis Date  ? Bowel trouble   ? urgency  ? Medical history non-contributory   ? Urinary urgency   ? ?Past Surgical History:  ?Procedure Laterality Date  ? BACK SURGERY  2010  ? CIRCUMCISION    ? LUMBAR LAMINECTOMY/DECOMPRESSION MICRODISCECTOMY  07/20/2011  ? Procedure: LUMBAR LAMINECTOMY/DECOMPRESSION MICRODISCECTOMY;  Surgeon: Carmela Hurt;  Location: [facility];  Service: Neurosurgery;  Laterality: N/A;  right thoracotomy with thoracic eight-nine discectomy and fusion  ? LUMBAR LAMINECTOMY/DECOMPRESSION MICRODISCECTOMY Right 10/06/2021  ? Procedure: Right Lumbar Four-Five Microdiscectomy, Right Lumbar Five-Sacal One Foraminotomy;  Surgeon: Coletta Memos, MD;  Location: [facility] OR;  Service: Neurosurgery;  Laterality: Right;  3C/RM 21  ? THORACIC DISCECTOMY  07/16/2012  ? Procedure: THORACIC DISCECTOMY;  Surgeon: Carmela Hurt, MD;  Location: [facility];  Service: Neurosurgery;  Laterality: Right;  RIGHT Thoracic seven-eight  thoracic diskectomy via thoracotomy by dr Laneta Simmers  ? THORACIC DISCECTOMY N/A 12/15/2014  ? Procedure: THORACIC SEVEN TO THORACIC NINE Laminectomy ;  Surgeon: Coletta Memos, MD;  Location: [facility];  Service: Neurosurgery;  Laterality: N/A;  ? THORACIC DISCECTOMY N/A 06/06/2017  ? Procedure: LAMINECTOMY THORACIC NINE-TEN;  Surgeon: Coletta Memos, MD;  Location: [facility] OR;   Service: Neurosurgery;  Laterality: N/A;  LAMINECTOMY THORACIC NINE-TEN  ? THORACOTOMY  07/20/2011  ? Procedure: THORACOTOMY OPEN FOR SPINE SURGERY;  Surgeon: D Karle Plumber, MD;  Location: [facility];  Service: Vascular;  Laterality: N/A;  ? THORACOTOMY  07/16/2012  ? Procedure: THORACOTOMY OPEN FOR SPINE SURGERY;  Surgeon: Alleen Borne, MD;  Location: [facility];  Service: Thoracic;  Laterality: N/A;  ? ?Patient Active Problem List  ? Diagnosis Date Noted  ? HNP (herniated nucleus pulposus), lumbar 10/06/2021  ? Hand pain 06/28/2021  ? Lumbar facet arthropathy 01/11/2021  ? Thoracic spondylosis with myelopathy 01/11/2021  ? Thoracic spinal stenosis 06/06/2017  ? Stenosis, spinal, thoracic 12/15/2014  ? 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/2011  ? ? ?[facility]: [facility] [place] ? ?REFERRING PROVIDER: Coletta Memos, MD ? 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 ?THERAPY DIAG:  ?Chronic bilateral low back pain with bilateral sciatica ? ?Pain in thoracic spine ? ?Muscle weakness (generalized) ? ?Other abnormalities of gait and mobility ? ?Difficulty in walking, not elsewhere classified ? ?Repeated falls ? ?ONSET DATE: Suddenly started having weakness 10 years ago, most recent episode of worsening October 2022, s/p right L4-5 laminotomy, microdiscectomy on 10/06/2021. ? ?SUBJECTIVE:                                                                                                                                                                                          ? ?  SUBJECTIVE STATEMENT: ?Patient underwent right L4-5 laminotomy, microdiscectomy on 10/06/2021. He reports the first week out of the hospital he could not walk at all. He was told to take ibuprofen and then he could get out of the bed. Since then he has been walking with his rollator. Before the surgery he  used a SPC in his right hand. Before surgery he was falling if he twisted a certain way. His right leg would give out when walking. He was having pain shooting up and down his right leg and his right leg was weak. He had bladder problems and other problems "down there." Patient reports since surgery his pain is about a 2/10. Before the surgery he had weakness on the left LE but no pain or paresthesia. He feels like after surgery both legs have been weaker. He continues to feel his right is weaker than his left. Patient reports in his symptoms first appeared in 2010 he got up to go to work and fell. He could not feel anything  from the waist down and he ignored it and went to work and continued to fall. His doctors eventually determined he had a "slipped disc." He has had 4 surgeries in his spine including the current surgery.  The first 3 are in the thoracic spine and the most recent is in the low back. Worsening that lead to lumbar surgery started in October of last year. Before that he was using a SPC intermittently and not falling as much. He report he has pain in the right groin when he has a bowel movement. His doctor thought it was from the surgery, but it is still happening. Next follow up with doctor is December 05, 2021. Functional Limitations: difficulty with bed mobility, transfers, driving, household and community ambulation, preventing falls, floor transfers, playing with his daughter, stairs, social participation and socializing with friends.  ? ?PERTINENT HISTORY:  ?Patient is a 41 y.o. 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    Patient denies hx of cancer, stroke, seizures, lung problems, heart problems, diabetes, unexplained weight loss, and osteoporosis. Patient reports he has difficulty with making it to the bathroom on time when he gets bowel or bladder urges and has stumbling. He has history of thoracic and lumbar surgeries. ? ? ?PAIN:  ?Are you having pain? Yes: NPRS scale: 2/10 ?Pain location: lower back right side incision, intermittently down right LE to anterior lower leg (no longer goes into calf like it did before surgery).  ?Pain description: achy shooting pain ?Aggravating factors: standing, walking, sitting ?Relieving factors: medicine , getting up to walk to the room and lay down.  ?Worst: 5/10, Best: 0/10.  ? ? ?PRECAUTIONS: Other: no lifting > 10 lbs, no bending/twisting at waist  (for at least  2 more weeks).  ? ?WEIGHT BEARING RESTRICTIONS No walk as much as possible ? ?FALLS:  ?Has patient fallen in last 6 months? Yes, Number of falls: a lot before surgery, maybe 8. He would be walking and his lower back would feel like it would give out or R knee would give out.  ? ?LIVING ENVIRONMENT: ?Lives with:  girlfreind ?Lives in: Other duplex ?Stairs: Yes: External: 2 steps; on left going up ?Has following equipment at home: Single point cane, bed side commode, and rollator, tub/shower combination.  ? ?OCCUPATION: on disability due to spine problems.  ? ?LEISURE: play games, 8015 month  old daughter.  ? ?PLOF: Independent prior to initial problems starting in 2010. Worsening that lead to lumbar surgery started in October of last year. Before that he was using a SPC intermittently and not fall

## 2021-11-06 ENCOUNTER — Encounter: Payer: Medicare Other | Admitting: Physical Therapy

## 2021-11-08 ENCOUNTER — Encounter: Payer: Self-pay | Admitting: Physical Therapy

## 2021-11-08 ENCOUNTER — Ambulatory Visit: Payer: Medicare Other | Admitting: Physical Therapy

## 2021-11-08 DIAGNOSIS — M6281 Muscle weakness (generalized): Secondary | ICD-10-CM

## 2021-11-08 DIAGNOSIS — G8929 Other chronic pain: Secondary | ICD-10-CM

## 2021-11-08 DIAGNOSIS — R2689 Other abnormalities of gait and mobility: Secondary | ICD-10-CM

## 2021-11-08 DIAGNOSIS — R262 Difficulty in walking, not elsewhere classified: Secondary | ICD-10-CM

## 2021-11-08 DIAGNOSIS — M5442 Lumbago with sciatica, left side: Secondary | ICD-10-CM | POA: Diagnosis not present

## 2021-11-08 DIAGNOSIS — R296 Repeated falls: Secondary | ICD-10-CM

## 2021-11-08 DIAGNOSIS — M546 Pain in thoracic spine: Secondary | ICD-10-CM

## 2021-11-08 NOTE — Therapy (Signed)
?OUTPATIENT PHYSICAL THERAPY TREATMENT NOTE ? ? ?Patient Name: Travis Fitzpatrick ?MRN: [id number] ?DOB:Mar 26, 1981, 41 y.o., male ?Today's Date: 11/08/2021 ? ? PT End of Session - 11/08/21 2114   ? ? Visit Number 2   ? Number of Visits 24   ? Date for PT Re-Evaluation 01/24/22   ? Authorization Type MEDICARE PART B reporting period from 11/01/2021   ? Progress Note Due on Visit 10   ? PT Start Time 1823   ? PT Stop Time 1905   ? PT Time Calculation (min) 42 min   ? Activity Tolerance Patient tolerated treatment well   ? [facility] assessed/performed   ? ?  ?  ? ?  ? ? ? ?Past Medical History:  ?Diagnosis Date  ? Bowel trouble   ? urgency  ? Medical history non-contributory   ? Urinary urgency   ? ?Past Surgical History:  ?Procedure Laterality Date  ? BACK SURGERY  2010  ? CIRCUMCISION    ? LUMBAR LAMINECTOMY/DECOMPRESSION MICRODISCECTOMY  07/20/2011  ? Procedure: LUMBAR LAMINECTOMY/DECOMPRESSION MICRODISCECTOMY;  Surgeon: Carmela HurtKyle L Cabbell;  Location: [facility];  Service: Neurosurgery;  Laterality: N/A;  right thoracotomy with thoracic eight-nine discectomy and fusion  ? LUMBAR LAMINECTOMY/DECOMPRESSION MICRODISCECTOMY Right 10/06/2021  ? Procedure: Right Lumbar Four-Five Microdiscectomy, Right Lumbar Five-Sacal One Foraminotomy;  Surgeon: Coletta Memosabbell, Kyle, MD;  Location: [facility];  Service: Neurosurgery;  Laterality: Right;  3C/RM 21  ? THORACIC DISCECTOMY  07/16/2012  ? Procedure: THORACIC DISCECTOMY;  Surgeon: Carmela HurtKyle L Cabbell, MD;  Location: [facility];  Service: Neurosurgery;  Laterality: Right;  RIGHT Thoracic seven-eight  thoracic diskectomy via thoracotomy by dr Laneta Simmersbartle  ? THORACIC DISCECTOMY N/A 12/15/2014  ? Procedure: THORACIC SEVEN TO THORACIC NINE Laminectomy ;  Surgeon: Coletta MemosKyle Cabbell, MD;  Location: [facility];  Service: Neurosurgery;  Laterality: N/A;  ? THORACIC DISCECTOMY N/A 06/06/2017  ? Procedure: LAMINECTOMY THORACIC NINE-TEN;  Surgeon: Coletta Memosabbell, Kyle, MD;  [facility];  Service:  Neurosurgery;  Laterality: N/A;  LAMINECTOMY THORACIC NINE-TEN  ? THORACOTOMY  07/20/2011  ? Procedure: THORACOTOMY OPEN FOR SPINE SURGERY;  Surgeon: D Karle PlumberPatrick Burney, MD;  Location: [facility];  Service: Vascular;  Laterality: N/A;  ? THORACOTOMY  07/16/2012  ? Procedure: THORACOTOMY OPEN FOR SPINE SURGERY;  Surgeon: Alleen BorneBryan K Bartle, MD;  Location: [facility];  Service: Thoracic;  Laterality: N/A;  ? ?Patient Active Problem List  ? Diagnosis Date Noted  ? HNP (herniated nucleus pulposus), lumbar 10/06/2021  ? Hand pain 06/28/2021  ? Lumbar facet arthropathy 01/11/2021  ? Thoracic spondylosis with myelopathy 01/11/2021  ? Thoracic spinal stenosis 06/06/2017  ? Stenosis, spinal, thoracic 12/15/2014  ? 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/2011  ? ? ?[facility] [place] ? ?REFERRING PROVIDER: Coletta Memosabbell, Kyle, MD ? 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 ?THERAPY DIAG:  ?Chronic bilateral low back pain with bilateral sciatica ? ?Pain in thoracic spine ? ?Muscle weakness (generalized) ? ?Other abnormalities of gait and mobility ? ?Difficulty in walking, not elsewhere classified ? ?Repeated falls ? ?ONSET DATE: Suddenly started having weakness 10 years ago, most recent episode of worsening October 2022, s/p right L4-5 laminotomy, microdiscectomy on 10/06/2021. ? ?PERTINENT HISTORY: Patient is a 41 y.o. 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 This  patient's chief complaints consist of low back pain, R LE pain/paresthesia/weakness, L LE weakness s/p right L4-5 laminotomy, microdiscectomy on 10/06/2021 leading to the following functional deficits: difficulty with bed mobility, transfers, driving, household and community ambulation, preventing falls, floor  transfers, playing with his daughter, stairs, social participation and socializing with friends. 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    Patient denies hx of cancer, stroke, seizures, lung problems, heart problems, diabetes, unexplained weight loss, and osteoporosis. Patient reports he has difficulty with making it to the bathroom on time when he gets bowel or bladder urges and has stumbling. He has history of thoracic and lumbar surgeries. ? ?PRECAUTIONS: Other: no lifting > 10 lbs, no bending/twisting at waist (for at least 2 more weeks).  ? ?PATIENT GOALS: get better and get off the walker to walk with [facility], play with his daughter (46months), get his strength back, be able to get up and down from floor. ? ?SUBJECTIVE: Patient reports he is feeling better than he was at his last PT session. He has 1/10 pain over his incision site. He has been walking daily. He states some days he gets really stiff, some days he can barely feel the pain, other days it is pretty intense. He arrives with a rollator. He wants to be able to get to the park with his daughter and family. He needs to have more strength in his legs and for the pain to go away.  ? ?PAIN:  ?Are you having pain? 1/10 ?Location: incision site ? ? ?TODAY'S TREATMENT  ?Therapeutic exercise: to centralize symptoms and improve ROM, strength, muscular endurance, and activity tolerance required for successful completion of functional activities.  ?- NuStep level 3 using bilateral lower extremities. Seat setting 11. For improved extremity mobility, muscular endurance, and activity tolerance; and to induce  the analgesic effect of aerobic exercise, stimulate improved joint nutrition, and prepare body structures and systems for following interventions. x 6:30  minutes. Average SPM = 67. ?- single leg press on OMEGA machine, 2x10 at 20# with PT blocking knee from complete extension due to risk for uncontrolled hyperextension.  ?- B hamstring curl at [facility] machine, 2x10 at 15# ?- seated hip abdution against blue theraband loop at knees. 1x10 with knees flexed, 2x10 with knees extended and feet on sliders (more appropriate challenge).  ?- seated hip adduction with knees flexed and small ball between knees, 5 second hold, 1x5 with ball (5RM), 1x5 with pillow ?- seated bilateral dorsiflexion AROM with knees extendion, 1x10 ?- sit <> stand with B UE support (one hand on plinth, one on relator) from 18.5 inch plinth, ~ 1x3-4 with hands positioned each way.  ?- Education on HEP including handout  ? ? ?HOME EXERCISE PROGRAM: ?Access Code: [email or web address] ?URL: [email or web address] ?Date: 11/08/2021 ?Prepared by: Norton Blizzard ? ?Exercises ?- Sit to Stand  - 1 x weekly - 2-3 sets - 5-10 reps ?- Seated Isometric Hip Adduction with Ball  - 1 x daily - 2-3 sets - 5-10 reps - 5 seconds hold ?- Seated Toe Raise  - 1 x daily - 2-3 sets - 10 reps ? ?PATIENT EDUCATION:  ?Education details: Exercise purpose/form. Self management techniques. HEP ?Person educated: Patient ?  Education method: Explanation, Demonstration, Tactile cues, and Verbal cues ?Education comprehension: verbalized understanding, returned demonstration, verbal cues required, tactile cues required, and needs further education ? ? ?ASSESSMENT: ? ?CLINICAL IMPRESSION: ? ?Patient tolerated treatment well with tolerable discomfort. Patient eager to participate and voiced feeling okay with the discomfort he felt. Patient required assistance to position his legs and body when getting on and off machines and demonstrated poor motor control while completing exercises. He  reported appropriate fatigue at end of session and safely exited clinic with his rollator. Initial HEP was provided this visit and patient voiced confidence he could perform it safely at home. Plan to follow up with

## 2021-11-13 ENCOUNTER — Encounter: Payer: Self-pay | Admitting: Physical Therapy

## 2021-11-13 ENCOUNTER — Ambulatory Visit: Payer: Medicare Other | Attending: Neurosurgery | Admitting: Physical Therapy

## 2021-11-13 DIAGNOSIS — M546 Pain in thoracic spine: Secondary | ICD-10-CM

## 2021-11-13 DIAGNOSIS — G8929 Other chronic pain: Secondary | ICD-10-CM | POA: Diagnosis present

## 2021-11-13 DIAGNOSIS — R296 Repeated falls: Secondary | ICD-10-CM | POA: Diagnosis present

## 2021-11-13 DIAGNOSIS — R262 Difficulty in walking, not elsewhere classified: Secondary | ICD-10-CM | POA: Diagnosis present

## 2021-11-13 DIAGNOSIS — M5442 Lumbago with sciatica, left side: Secondary | ICD-10-CM | POA: Diagnosis present

## 2021-11-13 DIAGNOSIS — M5441 Lumbago with sciatica, right side: Secondary | ICD-10-CM | POA: Diagnosis present

## 2021-11-13 DIAGNOSIS — R2689 Other abnormalities of gait and mobility: Secondary | ICD-10-CM

## 2021-11-13 DIAGNOSIS — M6281 Muscle weakness (generalized): Secondary | ICD-10-CM | POA: Diagnosis present

## 2021-11-13 DIAGNOSIS — M5459 Other low back pain: Secondary | ICD-10-CM | POA: Insufficient documentation

## 2021-11-13 NOTE — Therapy (Signed)
?OUTPATIENT PHYSICAL THERAPY TREATMENT NOTE ? ? ?Patient Name: Travis Fitzpatrick ?MRN: [id number] ?DOB:[birth year], 41 y.o., male ?Today's Date: 11/13/2021 ? ? PT End of Session - 11/13/21 2017   ? ? Visit Number 3   ? Number of Visits 24   ? Date for PT Re-Evaluation 01/24/22   ? Authorization Type MEDICARE PART B reporting period from 11/01/2021   ? Progress Note Due on Visit 10   ? PT Start Time 1823   ? PT Stop Time 1903   ? PT Time Calculation (min) 40 min   ? Activity Tolerance Patient tolerated treatment well   ? Behavior During Therapy [facility] for tasks assessed/performed   ? ?  ?  ? ?  ? ? ? ? ?Past Medical History:  ?Diagnosis Date  ? Bowel trouble   ? urgency  ? Medical history non-contributory   ? Urinary urgency   ? ?Past Surgical History:  ?Procedure Laterality Date  ? BACK SURGERY  2010  ? CIRCUMCISION    ? LUMBAR LAMINECTOMY/DECOMPRESSION MICRODISCECTOMY  07/20/2011  ? Procedure: LUMBAR LAMINECTOMY/DECOMPRESSION MICRODISCECTOMY;  Surgeon: Carmela Hurt;  Location: [facility];  Service: Neurosurgery;  Laterality: N/A;  right thoracotomy with thoracic eight-nine discectomy and fusion  ? LUMBAR LAMINECTOMY/DECOMPRESSION MICRODISCECTOMY Right 10/06/2021  ? Procedure: Right Lumbar Four-Five Microdiscectomy, Right Lumbar Five-Sacal One Foraminotomy;  Surgeon: Coletta Memos, MD;  Location: [facility] OR;  Service: Neurosurgery;  Laterality: Right;  3C/RM 21  ? THORACIC DISCECTOMY  07/16/2012  ? Procedure: THORACIC DISCECTOMY;  Surgeon: Carmela Hurt, MD;  Location: [facility];  Service: Neurosurgery;  Laterality: Right;  RIGHT Thoracic seven-eight  thoracic diskectomy via thoracotomy by dr Laneta Simmers  ? THORACIC DISCECTOMY N/A 12/15/2014  ? Procedure: THORACIC SEVEN TO THORACIC NINE Laminectomy ;  Surgeon: Coletta Memos, MD;  [facility]: [facility];  Service: Neurosurgery;  Laterality: N/A;  ? THORACIC DISCECTOMY N/A 06/06/2017  ? Procedure: LAMINECTOMY THORACIC NINE-TEN;  Surgeon: Coletta Memos, MD;  Location: [facility] OR;  Service:  Neurosurgery;  Laterality: N/A;  LAMINECTOMY THORACIC NINE-TEN  ? THORACOTOMY  07/20/2011  ? Procedure: THORACOTOMY OPEN FOR SPINE SURGERY;  Surgeon: D Karle Plumber, MD;  Location: [facility];  Service: Vascular;  Laterality: N/A;  ? THORACOTOMY  07/16/2012  ? Procedure: THORACOTOMY OPEN FOR SPINE SURGERY;  Surgeon: Alleen Borne, MD;  Location: [facility];  Service: Thoracic;  Laterality: N/A;  ? ?Patient Active Problem List  ? Diagnosis Date Noted  ? HNP (herniated nucleus pulposus), lumbar 10/06/2021  ? Hand pain 06/28/2021  ? Lumbar facet arthropathy 01/11/2021  ? Thoracic spondylosis with myelopathy 01/11/2021  ? Thoracic spinal stenosis 06/06/2017  ? Stenosis, spinal, thoracic 12/15/2014  ? 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/2011  ? ? ?[facility]: [facility] [place] ? ?REFERRING PROVIDER: Coletta Memos, MD ? 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 ?THERAPY DIAG:  ?Chronic bilateral low back pain with bilateral sciatica ? ?Pain in thoracic spine ? ?Muscle weakness (generalized) ? ?Other abnormalities of gait and mobility ? ?Difficulty in walking, not elsewhere classified ? ?Repeated falls ? ?ONSET DATE: Suddenly started having weakness 10 years ago, most recent episode of worsening October 2022, s/p right L4-5 laminotomy, microdiscectomy on 10/06/2021. ? ?PERTINENT HISTORY: Patient is a 41 y.o. 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  This patient's chief complaints consist of low back pain, R LE pain/paresthesia/weakness, L LE weakness s/p right L4-5 laminotomy, microdiscectomy on 10/06/2021 leading to the following functional deficits: difficulty with bed mobility, transfers, driving, household and community ambulation, preventing falls, floor  transfers, playing with his daughter, stairs, social participation and socializing with friends. 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    Patient denies hx of cancer, stroke, seizures, lung problems, heart problems, diabetes, unexplained weight loss, and osteoporosis. Patient reports he has difficulty with making it to the bathroom on time when he gets bowel or bladder urges and has stumbling. He has history of thoracic and lumbar surgeries. ? ?PRECAUTIONS: Other: no lifting > 10 lbs, no bending/twisting at waist (for at least 2 more weeks).  ? ?PATIENT GOALS: get better and get off the walker to walk with [facility], play with his daughter (54months), get his strength back, be able to get up and down from floor. ? ?SUBJECTIVE: Patient reports he is feeling well and has 2/10 pain in his low back, which is pretty usual for him. He was sore after last PT session but he felt comfortable with it being normal soreness. He states the day after last PT session was a rest day and he had increased soreness in his low back. He started doing HEP daily after that day of rest and it was sore at first but then started getting better.  ? ?PAIN:  ?Are you having pain? 2/10 ?Location: surgical site ? ?OBJECTIVE ? ?FUNCTIONAL TESTS ?- 6 Min Walk Test: 800 feet with rollator. Low back pain increased to 4-5/10 and had pain down R LE to calf.  ? ?TODAY'S TREATMENT  ?Therapeutic exercise: to centralize symptoms and improve ROM, strength, muscular endurance, and activity tolerance required for successful completion of functional activities.  ?- ambulation around clinic for distance to  assess baseline and to prepare body for remainder of exercises. (See 6 min walk test above).  ?- single leg press on OMEGA machine, 3x10 at 20# with PT blocking knee from complete extension due to risk for uncontrolled hyperextension.  ?- B hamstring curl at [facility] machine, 3x10 at 20# ?- seated single arm row (back unsupported), 3x10 each side at 10#, 1x5 each side with black theraband.  ?- Education on HEP including handout  ? ? ?HOME EXERCISE PROGRAM: ?Access Code: [email or web address] ?URL: [email or web address] ?Date: 11/13/2021 ?Prepared by: Norton Blizzard ? ?Exercises ?- Sit to Stand  - 1 x weekly - 2-3 sets - 5-10 reps ?- Seated Isometric Hip Adduction with Ball  - 1 x daily - 2-3 sets - 5-10 reps - 5 seconds hold ?- Seated Toe Raise  - 1 x daily - 2-3 sets - 10 reps ?- Seated Single Arm Shoulder Row with Anchored Resistance  - 1 x daily - 3 sets - 10 repsAccess Code: [email or web address] ?URL: [email or web address] ?Date: 11/08/2021 ?Prepared by: Norton Blizzard ? ?Exercises ?- Sit to Stand  - 1 x weekly - 2-3 sets - 5-10 reps ?- Seated Isometric Hip Adduction with Ball  - 1 x daily - 2-3 sets - 5-10 reps - 5 seconds hold ?- Seated Toe Raise  - 1 x daily - 2-3 sets - 10 reps ? ?  PATIENT EDUCATION:  ?Education details: Exercise purpose/form. Self management techniques. HEP ?Person educated: Patient ?Education method: Explanation, Demonstration, Tactile cues, and Verbal cues ?Education comprehension: verbalized understanding, returned demonstration, verbal cues required, tactile cues required, and needs further education ? ? ?ASSESSMENT: ? ?CLINICAL IMPRESSION: ? ?Patient tolerated treatment well with no increase in low back pain by end of session. He was able to progress exercise volume for B LE strengthening this session and started core strengthening in sitting. Patient continues to require physical assist to position B LE for exercises and demonstrated decreased motor control, strength, endurance, and power in  his B LE. Plan to continue with similar strengthening progression and consider starting step up as tolerated next session to help patient be able to go to the movie theater and use a seat in higher in the thea

## 2021-11-14 ENCOUNTER — Encounter: Payer: Medicare Other | Admitting: Physical Therapy

## 2021-11-16 ENCOUNTER — Encounter: Payer: Self-pay | Admitting: Physical Therapy

## 2021-11-16 ENCOUNTER — Ambulatory Visit: Payer: Medicare Other | Admitting: Physical Therapy

## 2021-11-16 DIAGNOSIS — M6281 Muscle weakness (generalized): Secondary | ICD-10-CM

## 2021-11-16 DIAGNOSIS — M546 Pain in thoracic spine: Secondary | ICD-10-CM

## 2021-11-16 DIAGNOSIS — R262 Difficulty in walking, not elsewhere classified: Secondary | ICD-10-CM

## 2021-11-16 DIAGNOSIS — G8929 Other chronic pain: Secondary | ICD-10-CM

## 2021-11-16 DIAGNOSIS — R2689 Other abnormalities of gait and mobility: Secondary | ICD-10-CM

## 2021-11-16 DIAGNOSIS — R296 Repeated falls: Secondary | ICD-10-CM

## 2021-11-16 DIAGNOSIS — M5442 Lumbago with sciatica, left side: Secondary | ICD-10-CM | POA: Diagnosis not present

## 2021-11-16 NOTE — Therapy (Signed)
?OUTPATIENT PHYSICAL THERAPY TREATMENT NOTE ? ? ?Patient Name: Travis Fitzpatrick ?MRN: [id number] ?DOB:[birth year], 41 y.o., male ?Today's Date: 11/16/2021 ? ? PT End of Session - 11/16/21 1723   ? ? Visit Number 4   ? Number of Visits 24   ? Date for PT Re-Evaluation 01/24/22   ? Authorization Type MEDICARE PART B reporting period from 11/01/2021   ? Progress Note Due on Visit 10   ? PT Start Time 1650   ? PT Stop Time 1728   ? PT Time Calculation (min) 38 min   ? Activity Tolerance Patient tolerated treatment well   ? [facility] for tasks assessed/performed   ? ?  ?  ? ?  ? ? ? ? ? ?Past Medical History:  ?Diagnosis Date  ? Bowel trouble   ? urgency  ? Medical history non-contributory   ? Urinary urgency   ? ?Past Surgical History:  ?Procedure Laterality Date  ? BACK SURGERY  2010  ? CIRCUMCISION    ? LUMBAR LAMINECTOMY/DECOMPRESSION MICRODISCECTOMY  07/20/2011  ? Procedure: LUMBAR LAMINECTOMY/DECOMPRESSION MICRODISCECTOMY;  Surgeon: Carmela HurtKyle L Cabbell;  [facility]: [facility];  Service: Neurosurgery;  Laterality: N/A;  right thoracotomy with thoracic eight-nine discectomy and fusion  ? LUMBAR LAMINECTOMY/DECOMPRESSION MICRODISCECTOMY Right 10/06/2021  ? Procedure: Right Lumbar Four-Five Microdiscectomy, Right Lumbar Five-Sacal One Foraminotomy;  Surgeon: Coletta Memosabbell, Kyle, MD;  Location: [facility];  Service: Neurosurgery;  Laterality: Right;  3C/RM 21  ? THORACIC DISCECTOMY  07/16/2012  ? Procedure: THORACIC DISCECTOMY;  Surgeon: Carmela HurtKyle L Cabbell, MD;  [facility]: [facility];  Service: Neurosurgery;  Laterality: Right;  RIGHT Thoracic seven-eight  thoracic diskectomy via thoracotomy by dr Laneta Simmersbartle  ? THORACIC DISCECTOMY N/A 12/15/2014  ? Procedure: THORACIC SEVEN TO THORACIC NINE Laminectomy ;  Surgeon: Coletta MemosKyle Cabbell, MD;  [facility]: [facility];  Service: Neurosurgery;  Laterality: N/A;  ? THORACIC DISCECTOMY N/A 06/06/2017  ? Procedure: LAMINECTOMY THORACIC NINE-TEN;  Surgeon: Coletta Memosabbell, Kyle, MD;  [facility];  Service:  Neurosurgery;  Laterality: N/A;  LAMINECTOMY THORACIC NINE-TEN  ? THORACOTOMY  07/20/2011  ? Procedure: THORACOTOMY OPEN FOR SPINE SURGERY;  Surgeon: D Karle PlumberPatrick Burney, MD;  [facility]: [facility];  Service: Vascular;  Laterality: N/A;  ? THORACOTOMY  07/16/2012  ? Procedure: THORACOTOMY OPEN FOR SPINE SURGERY;  Surgeon: Alleen BorneBryan K Bartle, MD;  Location: [facility];  Service: Thoracic;  Laterality: N/A;  ? ?Patient Active Problem List  ? Diagnosis Date Noted  ? HNP (herniated nucleus pulposus), lumbar 10/06/2021  ? Hand pain 06/28/2021  ? Lumbar facet arthropathy 01/11/2021  ? Thoracic spondylosis with myelopathy 01/11/2021  ? Thoracic spinal stenosis 06/06/2017  ? Stenosis, spinal, thoracic 12/15/2014  ? 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/2011  ? ? ?[facility] [place] ? ?REFERRING PROVIDER: Coletta Memosabbell, Kyle, MD ? 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 ?THERAPY DIAG:  ?Chronic bilateral low back pain with bilateral sciatica ? ?Pain in thoracic spine ? ?Muscle weakness (generalized) ? ?Other abnormalities of gait and mobility ? ?Difficulty in walking, not elsewhere classified ? ?Repeated falls ? ?ONSET DATE: Suddenly started having weakness 10 years ago, most recent episode of worsening October 2022, s/p right L4-5 laminotomy, microdiscectomy on 10/06/2021. ? ?PERTINENT HISTORY: Patient is a 41 y.o. male who presents to outpatient physical therapy with a referral for medical diagnosis  intervertebral disc disorder of thoracic region with  myelopathy. This patient's chief complaints consist of low back pain, R LE pain/paresthesia/weakness, L LE weakness s/p right L4-5 laminotomy, microdiscectomy on 10/06/2021 leading to the following functional deficits: difficulty with bed mobility, transfers, driving, household and community ambulation, preventing falls, floor  transfers, playing with his daughter, stairs, social participation and socializing with friends. 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    Patient denies hx of cancer, stroke, seizures, lung problems, heart problems, diabetes, unexplained weight loss, and osteoporosis. Patient reports he has difficulty with making it to the bathroom on time when he gets bowel or bladder urges and has stumbling. He has history of thoracic and lumbar surgeries. ? ?PRECAUTIONS: Other: no lifting > 10 lbs, no bending/twisting at waist (for at least 2 more weeks).  ? ?PATIENT GOALS: get better and get off the walker to walk with [facility], play with his daughter (73months), get his strength back, be able to get up and down from floor. ? ?SUBJECTIVE: Patient reports he is feeling sore and feels like he has "been hit by a truck." States pain is mostly in his lower back but he was sore everywhere. He was still able to get his walk in yesterday despite being sore.  ? ?PAIN:  ?Are you having pain? 3/10 ?Location: surgical site at low back ? ?TODAY'S TREATMENT  ?Therapeutic exercise: to centralize symptoms and improve ROM, strength, muscular endurance, and activity tolerance required for successful completion of functional activities.  ?- NuStep level 3 using bilateral lower extremities. Seat setting 11. For improved extremity mobility, muscular endurance, and activity tolerance; and to induce the analgesic effect of aerobic exercise, stimulate improved joint nutrition, and prepare body structures and systems for following interventions. x 6  minutes. Average SPM = 71. ?- ambulation 100  feet with SPC in R LE and CGA. Cautious steps.  ?- B knee extension at [facility] machine, 3x10 at 20# (gets very tired) ?- B hamstring curl at [facility] machine, 3x10 at 20# ?- seated single arm row (back unsupported), 3x10 each side at 10#.  ?- seated pallof press with 10# cable, 2x10 each side ? ?HOME EXERCISE PROGRAM: ? Access Code: [email or web address] ?URL: [email or web address] ?Date: 11/13/2021 ?Prepared by: Norton Blizzard ? ?Exercises ?- Sit to Stand  - 1 x weekly - 2-3 sets - 5-10 reps ?- Seated Isometric Hip Adduction with Ball  - 1 x daily - 2-3 sets - 5-10 reps - 5 seconds hold ?- Seated Toe Raise  - 1 x daily - 2-3 sets - 10 reps ?- Seated Single Arm Shoulder Row with Anchored Resistance  - 1 x daily - 3 sets - 10 reps ? ?PATIENT EDUCATION:  ?Education details: Exercise purpose/form. Self management techniques. HEP ?Person educated: Patient ?Education method: Explanation, Demonstration, Tactile cues, and Verbal cues ?Education comprehension: verbalized understanding, returned demonstration, verbal cues required, tactile cues required, and needs further education ? ? ?ASSESSMENT: ? ?CLINICAL IMPRESSION: ? ?Patient tolerated treatment with some difficulty due to back pain, especially with end range hamstring curl. Moved knee extension exercise from leg press to knee extension machine to decrease force at the lumbar spine. Patient was able to ambulate with [facility] today with CGA in the clinic but continues to have instability  in the LE that increases risk of knees buckling and PT advises continuing to use rollator until his legs get stronger. Patient appropriately fatigued by end of session. Plan to continue with functional, LE, and core strengthening as appropriate next session. Patient would benefit from continued management of limiting condition by skilled physical therapist to address remaining impairments and functional limitations to work towards stated goals and return to PLOF or maximal functional independence.   ? ?Patient is a 41 y.o. male referred to outpatient physical therapy with a medical diagnosis of intervertebral disc disorder of thoracic region with myelopathy who presents with signs and symptoms consistent

## 2021-11-20 ENCOUNTER — Ambulatory Visit: Payer: Medicare Other | Admitting: Physical Therapy

## 2021-11-20 ENCOUNTER — Encounter: Payer: Self-pay | Admitting: Physical Therapy

## 2021-11-20 DIAGNOSIS — R296 Repeated falls: Secondary | ICD-10-CM

## 2021-11-20 DIAGNOSIS — R262 Difficulty in walking, not elsewhere classified: Secondary | ICD-10-CM

## 2021-11-20 DIAGNOSIS — M6281 Muscle weakness (generalized): Secondary | ICD-10-CM

## 2021-11-20 DIAGNOSIS — M5442 Lumbago with sciatica, left side: Secondary | ICD-10-CM | POA: Diagnosis not present

## 2021-11-20 DIAGNOSIS — M546 Pain in thoracic spine: Secondary | ICD-10-CM

## 2021-11-20 DIAGNOSIS — G8929 Other chronic pain: Secondary | ICD-10-CM

## 2021-11-20 DIAGNOSIS — R2689 Other abnormalities of gait and mobility: Secondary | ICD-10-CM

## 2021-11-20 NOTE — Therapy (Signed)
?OUTPATIENT PHYSICAL THERAPY TREATMENT NOTE ? ? ?Patient Name: Travis Fitzpatrick ?MRN: [id number] ?DOB:1981-04-04, 41 y.o., male ?Today's Date: 11/20/2021 ? ? PT End of Session - 11/20/21 1826   ? ? Visit Number 5   ? Number of Visits 24   ? Date for PT Re-Evaluation 01/24/22   ? Authorization Type MEDICARE PART B reporting period from 11/01/2021   ? Progress Note Due on Visit 10   ? PT Start Time 1821   ? PT Stop Time 1902   ? PT Time Calculation (min) 41 min   ? Activity Tolerance Patient tolerated treatment well   ? [facility]/performed   ? ?  ?  ? ?  ? ? ? ? ? ? ?Past Medical History:  ?Diagnosis Date  ? Bowel trouble   ? urgency  ? Medical history non-contributory   ? Urinary urgency   ? ?Past Surgical History:  ?Procedure Laterality Date  ? BACK SURGERY  2010  ? CIRCUMCISION    ? LUMBAR LAMINECTOMY/DECOMPRESSION MICRODISCECTOMY  07/20/2011  ? Procedure: LUMBAR LAMINECTOMY/DECOMPRESSION MICRODISCECTOMY;  Surgeon: Carmela HurtKyle L Cabbell;  Location: [facility];  Service: Neurosurgery;  Laterality: N/A;  right thoracotomy with thoracic eight-nine discectomy and fusion  ? LUMBAR LAMINECTOMY/DECOMPRESSION MICRODISCECTOMY Right 10/06/2021  ? Procedure: Right Lumbar Four-Five Microdiscectomy, Right Lumbar Five-Sacal One Foraminotomy;  Surgeon: Coletta Memosabbell, Kyle, MD;  Location: [facility];  Service: Neurosurgery;  Laterality: Right;  3C/RM 21  ? THORACIC DISCECTOMY  07/16/2012  ? Procedure: THORACIC DISCECTOMY;  Surgeon: Carmela HurtKyle L Cabbell, MD;  Location: [facility];  Service: Neurosurgery;  Laterality: Right;  RIGHT Thoracic seven-eight  thoracic diskectomy via thoracotomy by dr Laneta Simmersbartle  ? THORACIC DISCECTOMY N/A 12/15/2014  ? Procedure: THORACIC SEVEN TO THORACIC NINE Laminectomy ;  Surgeon: Coletta MemosKyle Cabbell, MD;  Location: [facility];  Service: Neurosurgery;  Laterality: N/A;  ? THORACIC DISCECTOMY N/A 06/06/2017  ? Procedure: LAMINECTOMY THORACIC NINE-TEN;  Surgeon: Coletta Memosabbell, Kyle, MD;  [facility];   Service: Neurosurgery;  Laterality: N/A;  LAMINECTOMY THORACIC NINE-TEN  ? THORACOTOMY  07/20/2011  ? Procedure: THORACOTOMY OPEN FOR SPINE SURGERY;  Surgeon: D Karle PlumberPatrick Burney, MD;  Location: [facility];  Service: Vascular;  Laterality: N/A;  ? THORACOTOMY  07/16/2012  ? Procedure: THORACOTOMY OPEN FOR SPINE SURGERY;  Surgeon: Alleen BorneBryan K Bartle, MD;  Location: [facility];  Service: Thoracic;  Laterality: N/A;  ? ?Patient Active Problem List  ? Diagnosis Date Noted  ? HNP (herniated nucleus pulposus), lumbar 10/06/2021  ? Hand pain 06/28/2021  ? Lumbar facet arthropathy 01/11/2021  ? Thoracic spondylosis with myelopathy 01/11/2021  ? Thoracic spinal stenosis 06/06/2017  ? Stenosis, spinal, thoracic 12/15/2014  ? 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/2011  ? ? ?PCP: [facility] [place] ? ?REFERRING PROVIDER: Coletta Memosabbell, Kyle, MD ? 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 ?THERAPY DIAG:  ?Chronic bilateral low back pain with bilateral sciatica ? ?Pain in thoracic spine ? ?Muscle weakness (generalized) ? ?Other abnormalities of gait and mobility ? ?Difficulty in walking, not elsewhere classified ? ?Repeated falls ? ?ONSET DATE: Suddenly started having weakness 10 years ago, most recent episode of worsening October 2022, s/p right L4-5 laminotomy, microdiscectomy on 10/06/2021. ? ?PERTINENT HISTORY: Patient is a 41 y.o. male who presents to outpatient physical therapy with a referral for medical diagnosis  intervertebral disc disorder of thoracic region  with myelopathy. This patient's chief complaints consist of low back pain, R LE pain/paresthesia/weakness, L LE weakness s/p right L4-5 laminotomy, microdiscectomy on 10/06/2021 leading to the following functional deficits: difficulty with bed mobility, transfers, driving, household and community ambulation, preventing falls, floor  transfers, playing with his daughter, stairs, social participation and socializing with friends. 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    Patient denies hx of cancer, stroke, seizures, lung problems, heart problems, diabetes, unexplained weight loss, and osteoporosis. Patient reports he has difficulty with making it to the bathroom on time when he gets bowel or bladder urges and has stumbling. He has history of thoracic and lumbar surgeries. ? ?PRECAUTIONS: Other: no lifting > 10 lbs, no bending/twisting at waist (for at least 2 more weeks).  ? ?PATIENT GOALS: get better and get off the walker to walk with [facility], play with his daughter (76months), get his strength back, be able to get up and down from floor. ? ?SUBJECTIVE: Patient reports he is feeling well today and states his pain went up when starting PT but now it is coming down afterwards. He feels like he is getting stronger. He has continued to use the walker.  ? ?PAIN:  ?Are you having pain? 2/10 ?Location: surgical site at low back ? ?TODAY'S TREATMENT  ?Therapeutic exercise: to centralize symptoms and improve ROM, strength, muscular endurance, and activity tolerance required for successful completion of functional activities.  ?- NuStep level 4 using bilateral lower extremities. Seat setting 11. For improved extremity mobility, muscular endurance, and activity tolerance; and to induce the analgesic effect of aerobic exercise, stimulate improved joint nutrition, and prepare body structures and systems for following interventions. x 6:08  minutes. Average SPM = 73. ?- ambulation 150 feet with  rollator with supervision.  ?- ambulation 75+200 feet with SPC in R LE and CGA. Cautious steps but more confident than last time.  ?- B knee extension at [facility] machine, 3x10 at 25# (1 min rests) ?- B hamstring curl at [facility] machine, 3x10 at 20# (1 min rests) ?- standing foot taps on 8.5 inch edge of TM with B UE support, 3x10 progressing to eyes up. (Attempted with back to wall with marching but too unsteady) ?- seated pallof press with BlueTB loop, 3x10 each side ? ?HOME EXERCISE PROGRAM: ? Access Code: [email or web address] ?URL: [email or web address] ?Date: 11/13/2021 ?Prepared by: Norton Blizzard ? ?Exercises ?- Sit to Stand  - 1 x weekly - 2-3 sets - 5-10 reps ?- Seated Isometric Hip Adduction with Ball  - 1 x daily - 2-3 sets - 5-10 reps - 5 seconds hold ?- Seated Toe Raise  - 1 x daily - 2-3 sets - 10 reps ?- Seated Single Arm Shoulder Row with Anchored Resistance  - 1 x daily - 3 sets - 10 reps ? ?PATIENT EDUCATION:  ?Education details: Exercise purpose/form. Self management techniques. HEP ?Person educated: Patient ?Education method: Explanation, Demonstration, Tactile cues, and Verbal cues ?Education comprehension: verbalized understanding, returned demonstration, verbal cues required, tactile cues required, and needs further education ? ? ?ASSESSMENT: ? ?CLINICAL IMPRESSION: ? ?Patient tolerated treatment well and was able to walk farther with [facility] with less instability in his B LE and was able  to increase weight during knee extension exercise. Patient continues to have weakness in B LE and pain in low back that limits his functional mobility. Plan to continue with similar strengthening as appropriate next session. Patient would benefit from continued management of limiting condition by skilled physical therapist to address remaining impairments and functional limitations to work towards stated goals and return to PLOF or maximal functional independence.  ? ? ?Patient is a 41 y.o. male referred to outpatient  physical therapy with a medical diagnosis of intervertebral disc disorder of thoracic region with myelopathy who presents with signs and symptoms consistent with low back pain with right radiculopathy and B LE  w

## 2021-11-22 ENCOUNTER — Encounter: Payer: Self-pay | Admitting: Physical Therapy

## 2021-11-22 ENCOUNTER — Ambulatory Visit: Payer: Medicare Other | Admitting: Physical Therapy

## 2021-11-22 DIAGNOSIS — R296 Repeated falls: Secondary | ICD-10-CM

## 2021-11-22 DIAGNOSIS — M6281 Muscle weakness (generalized): Secondary | ICD-10-CM

## 2021-11-22 DIAGNOSIS — M546 Pain in thoracic spine: Secondary | ICD-10-CM

## 2021-11-22 DIAGNOSIS — M5459 Other low back pain: Secondary | ICD-10-CM

## 2021-11-22 DIAGNOSIS — M5442 Lumbago with sciatica, left side: Secondary | ICD-10-CM | POA: Diagnosis not present

## 2021-11-22 DIAGNOSIS — R262 Difficulty in walking, not elsewhere classified: Secondary | ICD-10-CM

## 2021-11-22 DIAGNOSIS — R2689 Other abnormalities of gait and mobility: Secondary | ICD-10-CM

## 2021-11-22 NOTE — Therapy (Signed)
?OUTPATIENT PHYSICAL THERAPY TREATMENT NOTE ? ? ?Patient Name: Travis Fitzpatrick ?MRN: [id number] ?DOB:1981-07-05, 41 y.o., male ?Today's Date: 11/22/2021 ? ? PT End of Session - 11/22/21 2012   ? ? Visit Number 6   ? Number of Visits 24   ? Date for PT Re-Evaluation 01/24/22   ? Authorization Type MEDICARE PART B reporting period from 11/01/2021   ? Progress Note Due on Visit 10   ? PT Start Time 1822   ? PT Stop Time 1904   ? PT Time Calculation (min) 42 min   ? Equipment Utilized During Treatment Gait belt   ? Activity Tolerance Patient tolerated treatment well   ? Behavior During Therapy [facility] for tasks assessed/performed   ? ?  ?  ? ?  ? ? ? ? ? ? ? ?Past Medical History:  ?Diagnosis Date  ? Bowel trouble   ? urgency  ? Medical history non-contributory   ? Urinary urgency   ? ?Past Surgical History:  ?Procedure Laterality Date  ? BACK SURGERY  2010  ? CIRCUMCISION    ? LUMBAR LAMINECTOMY/DECOMPRESSION MICRODISCECTOMY  07/20/2011  ? Procedure: LUMBAR LAMINECTOMY/DECOMPRESSION MICRODISCECTOMY;  Surgeon: Carmela Hurt;  Location: [facility];  Service: Neurosurgery;  Laterality: N/A;  right thoracotomy with thoracic eight-nine discectomy and fusion  ? LUMBAR LAMINECTOMY/DECOMPRESSION MICRODISCECTOMY Right 10/06/2021  ? Procedure: Right Lumbar Four-Five Microdiscectomy, Right Lumbar Five-Sacal One Foraminotomy;  Surgeon: Coletta Memos, MD;  Location: [facility];  Service: Neurosurgery;  Laterality: Right;  3C/RM 21  ? THORACIC DISCECTOMY  07/16/2012  ? Procedure: THORACIC DISCECTOMY;  Surgeon: Carmela Hurt, MD;  Location: [facility];  Service: Neurosurgery;  Laterality: Right;  RIGHT Thoracic seven-eight  thoracic diskectomy via thoracotomy by dr Laneta Simmers  ? THORACIC DISCECTOMY N/A 12/15/2014  ? Procedure: THORACIC SEVEN TO THORACIC NINE Laminectomy ;  Surgeon: Coletta Memos, MD;  Location: [facility];  Service: Neurosurgery;  Laterality: N/A;  ? THORACIC DISCECTOMY N/A 06/06/2017  ? Procedure: LAMINECTOMY THORACIC NINE-TEN;   Surgeon: Coletta Memos, MD;  Location: [facility];  Service: Neurosurgery;  Laterality: N/A;  LAMINECTOMY THORACIC NINE-TEN  ? THORACOTOMY  07/20/2011  ? Procedure: THORACOTOMY OPEN FOR SPINE SURGERY;  Surgeon: D Karle Plumber, MD;  Location: [facility];  Service: Vascular;  Laterality: N/A;  ? THORACOTOMY  07/16/2012  ? Procedure: THORACOTOMY OPEN FOR SPINE SURGERY;  Surgeon: Alleen Borne, MD;  Location: [facility];  Service: Thoracic;  Laterality: N/A;  ? ?Patient Active Problem List  ? Diagnosis Date Noted  ? HNP (herniated nucleus pulposus), lumbar 10/06/2021  ? Hand pain 06/28/2021  ? Lumbar facet arthropathy 01/11/2021  ? Thoracic spondylosis with myelopathy 01/11/2021  ? Thoracic spinal stenosis 06/06/2017  ? Stenosis, spinal, thoracic 12/15/2014  ? 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/2011  ? ? ?[facility] [place] ? ?REFERRING PROVIDER: Coletta Memos, MD ? 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 ?THERAPY DIAG:  ?Other low back pain ? ?Pain in thoracic spine ? ?Muscle weakness (generalized) ? ?Other abnormalities of gait and mobility ? ?Difficulty in walking, not elsewhere classified ? ?Repeated falls ? ?ONSET DATE: Suddenly started having weakness 10 years ago, most recent episode of worsening October 2022, s/p right L4-5 laminotomy, microdiscectomy on 10/06/2021. ? ?PERTINENT HISTORY: Patient is a 41 y.o. 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 This patient's chief complaints consist of low back pain, R LE pain/paresthesia/weakness, L LE weakness s/p right L4-5 laminotomy, microdiscectomy on 10/06/2021 leading to the following functional deficits: difficulty with bed mobility, transfers, driving, household and community ambulation, preventing  falls, floor transfers, playing with his daughter, stairs, social participation and socializing with friends. 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    Patient denies hx of cancer, stroke, seizures, lung problems, heart problems, diabetes, unexplained weight loss, and osteoporosis. Patient reports he has difficulty with making it to the bathroom on time when he gets bowel or bladder urges and has stumbling. He has history of thoracic and lumbar surgeries. ? ?PRECAUTIONS: Other: no lifting > 10 lbs, no bending/twisting at waist (for at least 2 more weeks).  ? ?PATIENT GOALS: get better and [facility], play with his daughter (15months), get his strength back, be able to get up and down from floor. ? ?SUBJECTIVE: Patient reports he is feeling well today and his back pain has been better. He states it is not going up as much after each PT visit and he can feel his legs getting stronger. He has been participating well in HEP. He rates current pain 2/10 in central low back near surgical site.  ? ?PAIN:  ?Are you having pain? 2/10 ?Location: surgical site at low back ? ?TODAY'S TREATMENT  ?Therapeutic exercise: to centralize symptoms and improve ROM, strength, muscular endurance, and activity tolerance required for successful completion of functional activities.  ?- NuStep level 5 using bilateral lower extremities. Seat setting 11. For improved extremity mobility, muscular endurance, and activity tolerance; and to induce the analgesic effect of aerobic exercise, stimulate improved joint nutrition, and prepare body structures and systems  for following interventions. x 5  minutes. Average SPM = 74. ?- ambulation 500 feet with SPC in R LE and SBA. ?- B knee extension at [facility]ME[facility] machine, 3x15 at 25# (1 min rests) ?- B hamstring curl [facility], 3x10 at 25# (1 min rests) ?- standing foot taps on 8.5 inch edge of TM with B UE support, 3x10 progressing to eyes up and two fingers per hand. (Attempted with back to wall with marching but too unsteady) ?- standing B hip IR/ER on rotation disks with B UE support, 3x10/15/15. Difficult to complete IR ?- seated pallof press with BlueTB loop, 3x10 each side ? ?HOME EXERCISE PROGRAM: ? Access Code: [email or web address] ?URL: [email or web address] ?Date: 11/13/2021 ?Prepared by: Norton BlizzardSara Roland Lipke ? ?Exercises ?- Sit to Stand  - 1 x weekly - 2-3 sets - 5-10 reps ?- Seated Isometric Hip Adduction with Ball  - 1 x daily - 2-3 sets - 5-10 reps - 5 seconds hold ?- Seated Toe Raise  - 1 x daily - 2-3 sets - 10 reps ?- Seated Single Arm Shoulder Row with Anchored Resistance  - 1 x daily - 3 sets - 10 reps ? ?PATIENT EDUCATION:  ?Education details: Exercise purpose/form. Self management techniques. HEP ?Person educated: Patient ?Education method: Explanation, Demonstration, Tactile cues, and Verbal cues ?Education comprehension: verbalized understanding, returned demonstration, verbal cues required, tactile cues required, and needs further education ? ? ?  ASSESSMENT: ? ?CLINICAL IMPRESSION: ?Patient tolerated treatment well overall with mild increase in pain from 2/10 to 3/10 by end of session. Patient was able increase exercise volume in knee extension and hamstring curls and walked 500 feet with SPC with SBA. He also was able to use SPC to ambulate around clinic during today's session. Patient noted to have increased R LE toe out that appears to be related to hip IR weakness so exercises added to help address this. He continues to be limited in functional mobility and normal function by back pain and LE weakness. Plan to  continue with progressive LE, functional, and core strengthening next session as appropriate. Patient would benefit from continued management of limiting condition by skilled physical therapist to address

## 2021-11-27 ENCOUNTER — Ambulatory Visit: Payer: Medicare Other | Admitting: Physical Therapy

## 2021-11-27 ENCOUNTER — Encounter: Payer: Self-pay | Admitting: Physical Therapy

## 2021-11-27 DIAGNOSIS — M6281 Muscle weakness (generalized): Secondary | ICD-10-CM

## 2021-11-27 DIAGNOSIS — M546 Pain in thoracic spine: Secondary | ICD-10-CM

## 2021-11-27 DIAGNOSIS — R296 Repeated falls: Secondary | ICD-10-CM

## 2021-11-27 DIAGNOSIS — R262 Difficulty in walking, not elsewhere classified: Secondary | ICD-10-CM

## 2021-11-27 DIAGNOSIS — R2689 Other abnormalities of gait and mobility: Secondary | ICD-10-CM

## 2021-11-27 DIAGNOSIS — M5442 Lumbago with sciatica, left side: Secondary | ICD-10-CM | POA: Diagnosis not present

## 2021-11-27 DIAGNOSIS — M5459 Other low back pain: Secondary | ICD-10-CM

## 2021-11-27 NOTE — Therapy (Signed)
?OUTPATIENT PHYSICAL THERAPY TREATMENT NOTE ? ? ?Patient Name: Travis Fitzpatrick ?MRN: [id number] ?DOB:1981-01-13, 41 y.o., male ?Today's Date: 11/27/2021 ? ? PT End of Session - 11/27/21 1813   ? ? Visit Number 7   ? Number of Visits 24   ? Date for PT Re-Evaluation 01/24/22   ? Authorization Type MEDICARE PART B reporting period from 11/01/2021   ? Progress Note Due on Visit 10   ? PT Start Time 1815   ? PT Stop Time 1855   ? PT Time Calculation (min) 40 min   ? Equipment Utilized During Treatment Gait belt   ? Activity Tolerance Patient tolerated treatment well   ? Behavior During Therapy [facility] for tasks assessed/performed   ? ?  ?  ? ?  ? ? ? ? ? ? ? ? ?Past Medical History:  ?Diagnosis Date  ? Bowel trouble   ? urgency  ? Medical history non-contributory   ? Urinary urgency   ? ?Past Surgical History:  ?Procedure Laterality Date  ? BACK SURGERY  2010  ? CIRCUMCISION    ? LUMBAR LAMINECTOMY/DECOMPRESSION MICRODISCECTOMY  07/20/2011  ? Procedure: LUMBAR LAMINECTOMY/DECOMPRESSION MICRODISCECTOMY;  Surgeon: Carmela Hurt;  Location: [facility];  Service: Neurosurgery;  Laterality: N/A;  right thoracotomy with thoracic eight-nine discectomy and fusion  ? LUMBAR LAMINECTOMY/DECOMPRESSION MICRODISCECTOMY Right 10/06/2021  ? Procedure: Right Lumbar Four-Five Microdiscectomy, Right Lumbar Five-Sacal One Foraminotomy;  Surgeon: Coletta Memos, MD;  Location: [facility] OR;  Service: Neurosurgery;  Laterality: Right;  3C/RM 21  ? THORACIC DISCECTOMY  07/16/2012  ? Procedure: THORACIC DISCECTOMY;  Surgeon: Carmela Hurt, MD;  Location: [facility];  Service: Neurosurgery;  Laterality: Right;  RIGHT Thoracic seven-eight  thoracic diskectomy via thoracotomy by dr Laneta Simmers  ? THORACIC DISCECTOMY N/A 12/15/2014  ? Procedure: THORACIC SEVEN TO THORACIC NINE Laminectomy ;  Surgeon: Coletta Memos, MD;  Location: [facility];  Service: Neurosurgery;  Laterality: N/A;  ? THORACIC DISCECTOMY N/A 06/06/2017  ? Procedure: LAMINECTOMY THORACIC NINE-TEN;   Surgeon: Coletta Memos, MD;  Location: [facility] OR;  Service: Neurosurgery;  Laterality: N/A;  LAMINECTOMY THORACIC NINE-TEN  ? THORACOTOMY  07/20/2011  ? Procedure: THORACOTOMY OPEN FOR SPINE SURGERY;  Surgeon: D Karle Plumber, MD;  Location: [facility];  Service: Vascular;  Laterality: N/A;  ? THORACOTOMY  07/16/2012  ? Procedure: THORACOTOMY OPEN FOR SPINE SURGERY;  Surgeon: Alleen Borne, MD;  Location: [facility];  Service: Thoracic;  Laterality: N/A;  ? ?Patient Active Problem List  ? Diagnosis Date Noted  ? HNP (herniated nucleus pulposus), lumbar 10/06/2021  ? Hand pain 06/28/2021  ? Lumbar facet arthropathy 01/11/2021  ? Thoracic spondylosis with myelopathy 01/11/2021  ? Thoracic spinal stenosis 06/06/2017  ? Stenosis, spinal, thoracic 12/15/2014  ? 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/2011  ? ? ?PCP: [facility] [place] ? ?REFERRING PROVIDER: Coletta Memos, MD ? 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 ?THERAPY DIAG:  ?Other low back pain ? ?Pain in thoracic spine ? ?Muscle weakness (generalized) ? ?Other abnormalities of gait and mobility ? ?Difficulty in walking, not elsewhere classified ? ?Repeated falls ? ?ONSET DATE: Suddenly started having weakness 10 years ago, most recent episode of worsening October 2022, s/p right L4-5 laminotomy, microdiscectomy on 10/06/2021. ? ?PERTINENT HISTORY: Patient is a 41 y.o. 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 This patient's chief complaints consist of low back pain, R LE pain/paresthesia/weakness, L LE weakness s/p right L4-5 laminotomy, microdiscectomy on 10/06/2021 leading to the following functional deficits: difficulty with bed mobility, transfers, driving, household and community ambulation, preventing  falls, floor transfers, playing with his daughter, stairs, social participation and socializing with friends. 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    Patient denies hx of cancer, stroke, seizures, lung problems, heart problems, diabetes, unexplained weight loss, and osteoporosis. Patient reports he has difficulty with making it to the bathroom on time when he gets bowel or bladder urges and has stumbling. He has history of thoracic and lumbar surgeries. ? ?PRECAUTIONS: Other: no lifting > 10 lbs, no bending/twisting at waist (for at least 2 more weeks).  ? ?PATIENT GOALS: get better and get off the walker to walk with [facility], play with his daughter (32months), get his strength back, be able to get up and down from floor. ? ?SUBJECTIVE: Patient reports he is doing okay. States his posterior thighs hurt and his legs feel heavy and weak. He states he felt good after his last PT session. He did his HEP the day after. He did HEP every day but did not walk today. Yesterday he had a cookout and he cooked on the grill. He thinks standing a long time causes his back to hurt and that's when his legs started feeling heavy. He thinks he stood almost an hour.  ? ?PAIN:  ?Are you having pain? 2/10 ?Location: surgical site at low back and B posterior thighs ? ?TODAY'S TREATMENT  ?Therapeutic exercise: to centralize symptoms and improve ROM, strength, muscular endurance, and activity tolerance required for successful completion of functional activities.  ?- NuStep level 5 using bilateral lower extremities. Seat setting 11. For improved extremity mobility, muscular  endurance, and activity tolerance; and to induce the analgesic effect of aerobic exercise, stimulate improved joint nutrition, and prepare body structures and systems for following interventions. x 5  minutes. Average SPM = 78. ?- ambulation 660 feet with SPC in R LE and SBA. ?- B knee extension at [facility] machine, 3x15 at 25#  ?- B hamstring curl at [facility] machine, 3x15 at 25# (1 min rests). ?- standing foot taps on 8.5 inch edge of TM with B UE support, 3x10 progressing to eyes up and two fingers per hand.  ?- seated pallof press with BlueTB, 3x15 each side ? ?HOME EXERCISE PROGRAM: ? Access Code: [email or web address] ?URL: [email or web address] ?Date: 11/13/2021 ?Prepared by: Rosita Kea ? ?Exercises ?- Sit to Stand  - 1 x weekly - 2-3 sets - 5-10 reps ?- Seated Isometric Hip Adduction with Ball  - 1 x daily - 2-3 sets - 5-10 reps - 5 seconds hold ?- Seated Toe Raise  - 1 x daily - 2-3 sets - 10 reps ?- Seated Single Arm Shoulder Row with Anchored Resistance  - 1 x daily - 3 sets - 10 reps ? ?PATIENT EDUCATION:  ?Education details: Exercise purpose/form. Self management techniques. HEP ?Person educated: Patient ?Education method: Explanation, Demonstration, Tactile cues, and Verbal cues ?Education comprehension: verbalized understanding, returned demonstration, verbal cues required, tactile cues required, and needs further  education ? ? ?ASSESSMENT: ? ?CLINICAL IMPRESSION: ?Patient tolerated treatment with some difficulty due to low back and posterior leg pain and tightness down to the calves. Patient reported no increase in pain by end of session. Plan to continue with interventions to improve functional and core strength next session. Plan to work on step up next session as appropriate. Patient would benefit from continued management of limiting condition by skilled physical therapist to address remaining impairments and functional limitations to work towards stated goals and return to PLOF or maximal functional  independence.  ? ?Patient is a 41 y.o. male referred to outpatient physical therapy with a medical diagnosis of intervertebral disc disorder of thoracic region with myelopathy who presents with signs and symp

## 2021-11-30 ENCOUNTER — Ambulatory Visit: Payer: Medicare Other | Admitting: Physical Therapy

## 2021-11-30 ENCOUNTER — Encounter: Payer: Self-pay | Admitting: Physical Therapy

## 2021-11-30 DIAGNOSIS — R2689 Other abnormalities of gait and mobility: Secondary | ICD-10-CM

## 2021-11-30 DIAGNOSIS — M546 Pain in thoracic spine: Secondary | ICD-10-CM

## 2021-11-30 DIAGNOSIS — M5459 Other low back pain: Secondary | ICD-10-CM

## 2021-11-30 DIAGNOSIS — R296 Repeated falls: Secondary | ICD-10-CM

## 2021-11-30 DIAGNOSIS — R262 Difficulty in walking, not elsewhere classified: Secondary | ICD-10-CM

## 2021-11-30 DIAGNOSIS — M6281 Muscle weakness (generalized): Secondary | ICD-10-CM

## 2021-11-30 DIAGNOSIS — M5442 Lumbago with sciatica, left side: Secondary | ICD-10-CM | POA: Diagnosis not present

## 2021-11-30 NOTE — Therapy (Signed)
?OUTPATIENT PHYSICAL THERAPY TREATMENT NOTE ? ? ?Patient Name: Travis Fitzpatrick ?MRN: [id number] ?DOB:[birth year], 41 y.o., male ?Today's Date: 11/30/2021 ? ? PT End of Session - 11/30/21 1723   ? ? Visit Number 8   ? Number of Visits 24   ? Date for PT Re-Evaluation 01/24/22   ? Authorization Type MEDICARE PART B reporting period from 11/01/2021   ? Progress Note Due on Visit 10   ? PT Start Time [id number]   ? PT Stop Time [id number]   ? PT Time Calculation (min) 40 min   ? Equipment Utilized During Treatment Gait belt   ? Activity Tolerance Patient tolerated treatment well   ? Behavior During Therapy [facility] for tasks assessed/performed   ? ?  ?  ? ?  ? ? ? ? ? ? ? ? ? ?Past Medical History:  ?Diagnosis Date  ? Bowel trouble   ? urgency  ? Medical history non-contributory   ? Urinary urgency   ? ?Past Surgical History:  ?Procedure Laterality Date  ? BACK SURGERY  2010  ? CIRCUMCISION    ? LUMBAR LAMINECTOMY/DECOMPRESSION MICRODISCECTOMY  07/20/2011  ? Procedure: LUMBAR LAMINECTOMY/DECOMPRESSION MICRODISCECTOMY;  Surgeon: Winfield Cunas;  Location: [place] NEUR[facility] ORS;  Service: Neurosurgery;  Laterality: N/A;  right thoracotomy with thoracic eight-nine discectomy and fusion  ? LUMBAR LAMINECTOMY/DECOMPRESSION MICRODISCECTOMY Right 10/06/2021  ? Procedure: Right Lumbar Four-Five Microdiscectomy, Right Lumbar Five-Sacal One Foraminotomy;  Surgeon: Ashok Pall, MD;  Location: [place];  Service: Neurosurgery;  Laterality: Right;  3C/RM 21  ? THORACIC DISCECTOMY  07/16/2012  ? Procedure: THORACIC DISCECTOMY;  Surgeon: Winfield Cunas, MD;  Location: [place] NEURO ORS;  Service: Neurosurgery;  Laterality: Right;  RIGHT Thoracic seven-eight  thoracic diskectomy via thoracotomy by dr Cyndia Bent  ? THORACIC DISCECTOMY N/A 12/15/2014  ? Procedure: THORACIC SEVEN TO THORACIC NINE Laminectomy ;  Surgeon: Ashok Pall, MD;  Location: [place] NEUR[facility] ORS;  Service: Neurosurgery;  Laterality: N/A;  ? THORACIC DISCECTOMY N/A 06/06/2017  ? Procedure: LAMINECTOMY THORACIC  NINE-TEN;  Surgeon: Ashok Pall, MD;  Location: [place];  Service: Neurosurgery;  Laterality: N/A;  LAMINECTOMY THORACIC NINE-TEN  ? THORACOTOMY  07/20/2011  ? Procedure: THORACOTOMY OPEN FOR SPINE SURGERY;  Surgeon: D Marlyn Corporal, MD;  Location: [facility];  Service: Vascular;  Laterality: N/A;  ? THORACOTOMY  07/16/2012  ? Procedure: THORACOTOMY OPEN FOR SPINE SURGERY;  Surgeon: Gaye Pollack, MD;  Location: [place] NEURO ORS;  Service: Thoracic;  Laterality: N/A;  ? ?Patient Active Problem List  ? Diagnosis Date Noted  ? HNP (herniated nucleus pulposus), lumbar 10/06/2021  ? Hand pain 06/28/2021  ? Lumbar facet arthropathy 01/11/2021  ? Thoracic spondylosis with myelopathy 01/11/2021  ? Thoracic spinal stenosis 06/06/2017  ? Stenosis, spinal, thoracic 12/15/2014  ? 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/2011  ? ? ?PCP: [facility] [place] ? ?REFERRING PROVIDER: Ashok Pall, MD ? 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 ?THERAPY DIAG:  ?Other low back pain ? ?Pain in thoracic spine ? ?Muscle weakness (generalized) ? ?Other abnormalities of gait and mobility ? ?Difficulty in walking, not elsewhere classified ? ?Repeated falls ? ?ONSET DATE: Suddenly started having weakness 10 years ago, most recent episode of worsening October 2022, s/p right L4-5 laminotomy, microdiscectomy on 10/06/2021. ? ?PERTINENT HISTORY: Patient is a 41 y.o. male who presents to outpatient physical therapy with a referral for medical  diagnosis  intervertebral disc disorder of thoracic region with myelopathy. This patient's chief complaints consist of low back pain, R LE pain/paresthesia/weakness, L LE weakness s/p right L4-5 laminotomy, microdiscectomy on 10/06/2021 leading to the following functional deficits: difficulty with bed mobility, transfers, driving, household and community ambulation,  preventing falls, floor transfers, playing with his daughter, stairs, social participation and socializing with friends. 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    Patient denies hx of cancer, stroke, seizures, lung problems, heart problems, diabetes, unexplained weight loss, and osteoporosis. Patient reports he has difficulty with making it to the bathroom on time when he gets bowel or bladder urges and has stumbling. He has history of thoracic and lumbar surgeries. ? ?PRECAUTIONS: Other: no lifting > 10 lbs, no bending/twisting at waist (for at least 2 more weeks).  ? ?PATIENT GOALS: get better and get off the walker to walk with [facility], play with his daughter (42months), get his strength back, be able to get up and down from floor. ? ?SUBJECTIVE: Patient reports he is feeling okay. He states he still is going through phases of feeling like his leg are weaker but he thinks it is related to when he feels he needs to have a BM. He states this was present prior to surgery but it is not as bad now. He states he has had no falls but is using his rollator. He reports no increased pain after last PT session. His HEP is going pretty good. He does them every day. He states they are getting easier.  ? ?PAIN:  ?Are you having pain? 2/10 ?Location: surgical site at low back and B posterior thighs ? ?TODAY'S TREATMENT  ?Therapeutic exercise: to centralize symptoms and improve ROM, strength, muscular endurance, and activity tolerance required for successful completion of functional activities.  ?- NuStep level 5 using bilateral lower extremities. Seat setting 11. For  improved extremity mobility, muscular endurance, and activity tolerance; and to induce the analgesic effect of aerobic exercise, stimulate improved joint nutrition, and prepare body structures and systems for following interventions. x 5  minutes. Average SPM = 75. ?- ambulation 850 feet with SPC in R LE and SBA. (break about half way through to use the restroom) ? ?Superset: ?- B knee extension at [place], 3x15/15/13 at 35# (hitting failure last 1-2 reps).  ?- B hamstring curl at [place], 3x10/15/19 at 30/25/25#  ? ?- step up to 6 inch step with B UE support and CGA, 1x10 each side (increasing back pain, L stronger than R).  ?- seated pallof press with BlueTB, 3x15 each side ? ?HOME EXERCISE PROGRAM: ? Access Code: [email or web address] ?URL: [email or web address] ?Date: 11/13/2021 ?Prepared by: Rosita Kea ? ?Exercises ?- Sit to Stand  - 1 x weekly - 2-3 sets - 5-10 reps ?- Seated Isometric Hip Adduction with Ball  - 1 x daily - 2-3 sets - 5-10 reps - 5 seconds hold ?- Seated Toe Raise  - 1 x daily - 2-3 sets - 10 reps ?- Seated Single Arm Shoulder Row with Anchored Resistance  - 1 x daily - 3 sets - 10 reps ? ?PATIENT EDUCATION:  ?Education details: Exercise purpose/form. Self management techniques. HEP ?Person educated: Patient ?  Education method: Explanation, Demonstration, Tactile cues, and Verbal cues ?Education comprehension: verbalized understanding, returned demonstration, verbal cues required, tactile cues required, and needs further education ? ? ?ASSESSMENT: ? ?CLINICAL IMPRESSION: ?Patient tolerated treatment well with increase in back pain to 4/10 by end of session. Patient associates this with increased constipation and also reports feelings of leg weakness associated with urinary and bowel urgency. Patient able to increase volume for LE strengthening and walk further with [facility] today than before. Started step up to improve ability to climb stairs at the movie theater. Plan to continue with  LE and core strengthening as tolerated next session. Patient would benefit from continued management of limiting condition by skilled physical therapist to address remaining impairments and functional limitations

## 2021-12-04 ENCOUNTER — Ambulatory Visit: Payer: Medicare Other | Admitting: Physical Therapy

## 2021-12-04 ENCOUNTER — Encounter: Payer: Self-pay | Admitting: Physical Therapy

## 2021-12-04 DIAGNOSIS — M5442 Lumbago with sciatica, left side: Secondary | ICD-10-CM | POA: Diagnosis not present

## 2021-12-04 DIAGNOSIS — R262 Difficulty in walking, not elsewhere classified: Secondary | ICD-10-CM

## 2021-12-04 DIAGNOSIS — M5459 Other low back pain: Secondary | ICD-10-CM

## 2021-12-04 DIAGNOSIS — R296 Repeated falls: Secondary | ICD-10-CM

## 2021-12-04 DIAGNOSIS — M6281 Muscle weakness (generalized): Secondary | ICD-10-CM

## 2021-12-04 DIAGNOSIS — M546 Pain in thoracic spine: Secondary | ICD-10-CM

## 2021-12-04 DIAGNOSIS — R2689 Other abnormalities of gait and mobility: Secondary | ICD-10-CM

## 2021-12-04 NOTE — Therapy (Signed)
?OUTPATIENT PHYSICAL THERAPY TREATMENT NOTE ? ? ?Patient Name: Travis Fitzpatrick ?MRN: [id number] ?DOB:August 05, 1981, 41 y.o., male ?Today's Date: 12/04/2021 ? ? PT End of Session - 12/04/21 1519   ? ? Visit Number 9   ? Number of Visits 24   ? Date for PT Re-Evaluation 01/24/22   ? Authorization Type MEDICARE PART B reporting period from 11/01/2021   ? Progress Note Due on Visit 10   ? PT Start Time 1519   ? PT Stop Time 1600   ? PT Time Calculation (min) 41 min   ? Equipment Utilized During Treatment Gait belt   ? Activity Tolerance Patient tolerated treatment well   ? Behavior During Therapy [facility] for tasks assessed/performed   ? ?  ?  ? ?  ? ? ? ? ? ? ? ? ? ? ?Past Medical History:  ?Diagnosis Date  ? Bowel trouble   ? urgency  ? Medical history non-contributory   ? Urinary urgency   ? ?Past Surgical History:  ?Procedure Laterality Date  ? BACK SURGERY  2010  ? CIRCUMCISION    ? LUMBAR LAMINECTOMY/DECOMPRESSION MICRODISCECTOMY  07/20/2011  ? Procedure: LUMBAR LAMINECTOMY/DECOMPRESSION MICRODISCECTOMY;  Surgeon: Winfield Cunas;  Location: [place] [facility];  Service: Neurosurgery;  Laterality: N/A;  right thoracotomy with thoracic eight-nine discectomy and fusion  ? LUMBAR LAMINECTOMY/DECOMPRESSION MICRODISCECTOMY Right 10/06/2021  ? Procedure: Right Lumbar Four-Five Microdiscectomy, Right Lumbar Five-Sacal One Foraminotomy;  Surgeon: Ashok Pall, MD;  Location: [place];  Service: Neurosurgery;  Laterality: Right;  3C/RM 21  ? THORACIC DISCECTOMY  07/16/2012  ? Procedure: THORACIC DISCECTOMY;  Surgeon: Winfield Cunas, MD;  Location: [place] [facility];  Service: Neurosurgery;  Laterality: Right;  RIGHT Thoracic seven-eight  thoracic diskectomy via thoracotomy by dr Cyndia Bent  ? THORACIC DISCECTOMY N/A 12/15/2014  ? Procedure: THORACIC SEVEN TO THORACIC NINE Laminectomy ;  Surgeon: Ashok Pall, MD;  Location: [place] [facility];  Service: Neurosurgery;  Laterality: N/A;  ? THORACIC DISCECTOMY N/A 06/06/2017  ? Procedure: LAMINECTOMY THORACIC  NINE-TEN;  Surgeon: Ashok Pall, MD;  Location: [place];  Service: Neurosurgery;  Laterality: N/A;  LAMINECTOMY THORACIC NINE-TEN  ? THORACOTOMY  07/20/2011  ? Procedure: THORACOTOMY OPEN FOR SPINE SURGERY;  Surgeon: D Marlyn Corporal, MD;  Location: [facility];  Service: Vascular;  Laterality: N/A;  ? THORACOTOMY  07/16/2012  ? Procedure: THORACOTOMY OPEN FOR SPINE SURGERY;  Surgeon: Gaye Pollack, MD;  Location: [place] [facility];  Service: Thoracic;  Laterality: N/A;  ? ?Patient Active Problem List  ? Diagnosis Date Noted  ? HNP (herniated nucleus pulposus), lumbar 10/06/2021  ? Hand pain 06/28/2021  ? Lumbar facet arthropathy 01/11/2021  ? Thoracic spondylosis with myelopathy 01/11/2021  ? Thoracic spinal stenosis 06/06/2017  ? Stenosis, spinal, thoracic 12/15/2014  ? 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/2011  ? ? ?PCP: [facility] [place] ? ?REFERRING PROVIDER: Ashok Pall, MD ? 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 ?THERAPY DIAG:  ?Other low back pain ? ?Pain in thoracic spine ? ?Muscle weakness (generalized) ? ?Other abnormalities of gait and mobility ? ?Difficulty in walking, not elsewhere classified ? ?Repeated falls ? ?ONSET DATE: Suddenly started having weakness 10 years ago, most recent episode of worsening October 2022, s/p right L4-5 laminotomy, microdiscectomy on 10/06/2021. ? ?PERTINENT HISTORY: Patient is a 41 y.o. 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 This patient's chief complaints consist of low back pain, R LE pain/paresthesia/weakness, L LE weakness s/p right L4-5 laminotomy, microdiscectomy on 10/06/2021 leading to the following functional deficits: difficulty with bed mobility, transfers, driving, household and community ambulation,  preventing falls, floor transfers, playing with his daughter, stairs, social participation and socializing with friends. 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    Patient denies hx of cancer, stroke, seizures, lung problems, heart problems, diabetes, unexplained weight loss, and osteoporosis. Patient reports he has difficulty with making it to the bathroom on time when he gets bowel or bladder urges and has stumbling. He has history of thoracic and lumbar surgeries. ? ?PRECAUTIONS: Other: no lifting > 10 lbs, no bending/twisting at waist (for at least 2 more weeks).  ? ?PATIENT GOALS: get better and get off the walker to walk with [facility], play with his daughter (58months), get his strength back, be able to get up and down from floor. ? ?SUBJECTIVE: Patient reports he is feeling okay. He states his back and leg pain was worse after last PT session, but he took his usual medication and laid down. He felt better later after having a BM. He noticed increased leg weakness and pain in the backs of his thighs and calves after last PT session. He states he did not do much this weekend except his HEP. He currently has 2/10 pain in his backs. B LE burning.  ? ?PAIN:  ?Are you having pain? 2/10 ?Location: surgical site at low back, legs feel heavy and burning.  ? ?TODAY'S TREATMENT  ?Therapeutic exercise: to centralize symptoms and improve ROM, strength, muscular endurance, and activity tolerance required for successful completion of functional activities.  ?- NuStep level 5 using bilateral lower extremities. Seat setting 11. For improved extremity mobility, muscular  endurance, and activity tolerance; and to induce the analgesic effect of aerobic exercise, stimulate improved joint nutrition, and prepare body structures and systems for following interventions. x 5  minutes. Average SPM = 75. ?- ambulation 700 feet with SPC in R LE and SBA. (Weakness/pain in R LE from hip to toes limited him) ? ?Superset: ?- B knee extension at [facility] machine, 3x10 at 35# ?- B hamstring curl at [facility] machine, 3x10 at 25#  ? ?- standing hip abduction with B UE support, 3x10 each side. (Cuing not to go into genurecurvatum R > L).  ?- standing hip flexion with B UE support in front, 2x10 each side, concentrating on not hyperextending support knee.  ? ?HOME EXERCISE PROGRAM: ? Access Code: [email or web address] ?URL: [email or web address] ?Date: 11/13/2021 ?Prepared by: Rosita Kea ? ?Exercises ?- Sit to Stand  - 1 x weekly - 2-3 sets - 5-10 reps ?- Seated Isometric Hip Adduction with Ball  - 1 x daily - 2-3 sets - 5-10 reps - 5 seconds hold ?- Seated Toe Raise  - 1 x daily - 2-3 sets - 10 reps ?- Seated Single Arm Shoulder Row with Anchored Resistance  - 1 x daily - 3 sets - 10 reps ? ?PATIENT EDUCATION:  ?Education details: Exercise purpose/form. Self management techniques. HEP ?Person educated: Patient ?Education method: Explanation, Demonstration, Tactile cues,  and Verbal cues ?Education comprehension: verbalized understanding, returned demonstration, verbal cues required, tactile cues required, and needs further education ? ? ?ASSESSMENT: ? ?CLINICAL IMPRESSION: ?Patient tolerated treatment with some discomfort and intermittent shooting pain in R LE. Continued with LE, functional, and core strengthening this session. Plan to complete progress note at next session. Patient would benefit from continued management of limiting condition by skilled physical therapist to address remaining impairments and functional limitations to work towards stated goals and return to PLOF or maximal functional  independence.  ? ? ?Patient is a 41 y.o. male referred to outpatient physical therapy with a medical diagnosis of intervertebral disc disorder of thoracic region with myelopathy who presents with signs and symptoms c

## 2021-12-06 ENCOUNTER — Encounter: Payer: Self-pay | Admitting: Physical Therapy

## 2021-12-06 ENCOUNTER — Ambulatory Visit: Payer: Medicare Other | Admitting: Physical Therapy

## 2021-12-06 DIAGNOSIS — R2689 Other abnormalities of gait and mobility: Secondary | ICD-10-CM

## 2021-12-06 DIAGNOSIS — M546 Pain in thoracic spine: Secondary | ICD-10-CM

## 2021-12-06 DIAGNOSIS — M5442 Lumbago with sciatica, left side: Secondary | ICD-10-CM | POA: Diagnosis not present

## 2021-12-06 DIAGNOSIS — R262 Difficulty in walking, not elsewhere classified: Secondary | ICD-10-CM

## 2021-12-06 DIAGNOSIS — M6281 Muscle weakness (generalized): Secondary | ICD-10-CM

## 2021-12-06 DIAGNOSIS — M5459 Other low back pain: Secondary | ICD-10-CM

## 2021-12-06 DIAGNOSIS — R296 Repeated falls: Secondary | ICD-10-CM

## 2021-12-06 NOTE — Therapy (Signed)
?OUTPATIENT PHYSICAL THERAPY TREATMENT / PROGRESS NOTE ?Dates of reporting: 11/01/2021 to 01/05/2022 ? ? ?Patient Name: Travis Fitzpatrick ?MRN: [id number] ?DOB:[birth year], 41 y.o., male ?Today's Date: 12/06/2021 ? ? PT End of Session - 12/06/21 1718   ? ? Visit Number 10   ? Number of Visits 24   ? Date for PT Re-Evaluation 01/24/22   ? Authorization Type MEDICARE PART B reporting period from 11/01/2021   ? Progress Note Due on Visit 10   ? PT Start Time [id number]   ? PT Stop Time 1558   ? PT Time Calculation (min) 40 min   ? Equipment Utilized During Treatment Gait belt   ? Activity Tolerance Patient tolerated treatment well   ? Behavior During Therapy [facility] for tasks assessed/performed   ? ?  ?  ? ?  ? ? ? ? ? ? ? ? ? ? ? ?Past Medical History:  ?Diagnosis Date  ? Bowel trouble   ? urgency  ? Medical history non-contributory   ? Urinary urgency   ? ?Past Surgical History:  ?Procedure Laterality Date  ? BACK SURGERY  2010  ? CIRCUMCISION    ? LUMBAR LAMINECTOMY/DECOMPRESSION MICRODISCECTOMY  07/20/2011  ? Procedure: LUMBAR LAMINECTOMY/DECOMPRESSION MICRODISCECTOMY;  Surgeon: Winfield Cunas;  Location: [place] [facility];  Service: Neurosurgery;  Laterality: N/A;  right thoracotomy with thoracic eight-nine discectomy and fusion  ? LUMBAR LAMINECTOMY/DECOMPRESSION MICRODISCECTOMY Right 10/06/2021  ? Procedure: Right Lumbar Four-Five Microdiscectomy, Right Lumbar Five-Sacal One Foraminotomy;  Surgeon: Ashok Pall, MD;  Location: [place];  Service: Neurosurgery;  Laterality: Right;  3C/RM 21  ? THORACIC DISCECTOMY  07/16/2012  ? Procedure: THORACIC DISCECTOMY;  Surgeon: Winfield Cunas, MD;  Location: [place] [facility];  Service: Neurosurgery;  Laterality: Right;  RIGHT Thoracic seven-eight  thoracic diskectomy via thoracotomy by dr Cyndia Bent  ? THORACIC DISCECTOMY N/A 12/15/2014  ? Procedure: THORACIC SEVEN TO THORACIC NINE Laminectomy ;  Surgeon: Ashok Pall, MD;  Location: [place] [facility];  Service: Neurosurgery;  Laterality: N/A;  ? THORACIC  DISCECTOMY N/A 06/06/2017  ? Procedure: LAMINECTOMY THORACIC NINE-TEN;  Surgeon: Ashok Pall, MD;  Location: [place];  Service: Neurosurgery;  Laterality: N/A;  LAMINECTOMY THORACIC NINE-TEN  ? THORACOTOMY  07/20/2011  ? Procedure: THORACOTOMY OPEN FOR SPINE SURGERY;  Surgeon: D Marlyn Corporal, MD;  Location: [facility];  Service: Vascular;  Laterality: N/A;  ? THORACOTOMY  07/16/2012  ? Procedure: THORACOTOMY OPEN FOR SPINE SURGERY;  Surgeon: Gaye Pollack, MD;  Location: [place] [facility];  Service: Thoracic;  Laterality: N/A;  ? ?Patient Active Problem List  ? Diagnosis Date Noted  ? HNP (herniated nucleus pulposus), lumbar 10/06/2021  ? Hand pain 06/28/2021  ? Lumbar facet arthropathy 01/11/2021  ? Thoracic spondylosis with myelopathy 01/11/2021  ? Thoracic spinal stenosis 06/06/2017  ? Stenosis, spinal, thoracic 12/15/2014  ? 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/2011  ? ? ?PCP: [facility] [place] ? ?REFERRING PROVIDER: Ashok Pall, MD ? 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 ?THERAPY DIAG:  ?Other low back pain ? ?Pain in thoracic spine ? ?Muscle weakness (generalized) ? ?Other abnormalities of gait and mobility ? ?Difficulty in walking, not elsewhere classified ? ?Repeated falls ? ?ONSET DATE: Suddenly started having weakness 10 years ago, most recent episode of worsening October 2022, s/p right L4-5 laminotomy, microdiscectomy on 10/06/2021. ? ?PERTINENT HISTORY: Patient is a 41 y.o. male who  presents to outpatient physical therapy with a referral for medical diagnosis  intervertebral disc disorder of thoracic region with myelopathy. This patient's chief complaints consist of low back pain, R LE pain/paresthesia/weakness, L LE weakness s/p right L4-5 laminotomy, microdiscectomy on 10/06/2021 leading to the following functional deficits: difficulty with bed  mobility, transfers, driving, household and community ambulation, preventing falls, floor transfers, playing with his daughter, stairs, social participation and socializing with friends. 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    Patient denies hx of cancer, stroke, seizures, lung problems, heart problems, diabetes, unexplained weight loss, and osteoporosis. Patient reports he has difficulty with making it to the bathroom on time when he gets bowel or bladder urges and has stumbling. He has history of thoracic and lumbar surgeries. ? ?PRECAUTIONS: Other: no lifting > 10 lbs, no bending/twisting at waist (for at least 2 more weeks).  ? ?PATIENT GOALS: get better and get off the walker to walk with [facility], play with his daughter (46months), get his strength back, be able to get up and down from floor. ? ?SUBJECTIVE: Patient states he is feeling better today with 2/10 pain in his low back only upon arrival. He states he got better sleep last night because his mother in law kept his daughter for the night. He states was sore after last PT session but not as sore as before. He feels like he is getting stronger and PT is helping. He continues to feel like his bowels and need to have a BM affects the strength in his legs where they feel really weak when he has the urge to go. He also gets pain in his right groin before having a BM.  ? ?PAIN:  ?Are you having pain? 2/10 ?Location: surgical site at low back ?OBJECTIVE ? ?SELF-REPORTED FUNCTION ?FOTO score: 45/100 (lumbar spine questionnaire) ? ?FUNCTIONAL TESTS:  ?5 times sit to stand: 21 second from 18.5 inch plinth  with B UE support on plinth. Walker positioned in front of him for safety but did not need it.  ? ?6 Minute Walk Test: 943 feet with SPC and SBA (two little standing breaks to stretch back due to tightness at low back, increased sharper pain in back and B legs).  ? ?TODAY'S TREATMENT  ?Therapeutic exercise: to centralize symptoms and improve ROM, strength, muscular endurance, and activity tolerance required for successful completion of functional activities.  ?- ambulation 943 feet with SPC in R LE and SBA. ?- sit <> stand 1x5 for speed with B UE support from edge of 18.5 inch plinth.  ? ?Superset: ?- B knee extension at [facility] machine, 3x10 at 35# ?- B hamstring curl at [facility] machine, 3x10 at 25#  ? ?HOME EXERCISE PROGRAM: ? Access Code: [email or web address] ?URL: [email or web address] ?Date: 11/13/2021 ?Prepared by: Rosita Kea ? ?Exercises ?- Sit to Stand  - 1 x weekly - 2-3 sets - 5-10 reps ?- Seated Isometric Hip Adduction with Ball  - 1 x daily - 2-3 sets - 5-10 reps - 5 seconds hold ?- Seated Toe Raise  - 1 x daily - 2-3 sets - 10 reps ?- Seated Single Arm Shoulder Row with Anchored Resistance  - 1 x daily - 3 sets -  10 reps ? ?PATIENT EDUCATION:  ?Education details: Exercise purpose/form. Self management techniques. HEP ?Person educated: Patient ?Education method: Explanation, Demonstration, Tactile cues, and Verbal cues ?Education comprehension: verbalized understanding, returned demonstration, verbal cues required, tactile cues required, and needs further education ? ? ?ASSESSMENT: ? ?CLINICAL IMPRESSION: ?Patient has attended 10 physical therapy sessions since starting  this episode of care on 11/01/2021. Patient is making good progress towards his goals and has transitioned from using rollator to [facility] for 6 Minute Walk Test and is now able to perform 5 Time Sit To Stand Test faster and without support from his rollator. Patient continues to have profound weakness, decreased endurance, motor control, and  balance deficits and are severely affecting his mobility, ADLS, IADLs and social participation. Patient is also having difficulty managing his bowel and bladder function to be able to participate better in social activ

## 2021-12-11 ENCOUNTER — Encounter: Payer: Self-pay | Admitting: Physical Therapy

## 2021-12-11 ENCOUNTER — Ambulatory Visit: Payer: Medicare Other | Attending: Neurosurgery | Admitting: Physical Therapy

## 2021-12-11 DIAGNOSIS — M6281 Muscle weakness (generalized): Secondary | ICD-10-CM | POA: Diagnosis present

## 2021-12-11 DIAGNOSIS — R296 Repeated falls: Secondary | ICD-10-CM | POA: Insufficient documentation

## 2021-12-11 DIAGNOSIS — M5459 Other low back pain: Secondary | ICD-10-CM | POA: Insufficient documentation

## 2021-12-11 DIAGNOSIS — M546 Pain in thoracic spine: Secondary | ICD-10-CM | POA: Insufficient documentation

## 2021-12-11 DIAGNOSIS — R2689 Other abnormalities of gait and mobility: Secondary | ICD-10-CM | POA: Insufficient documentation

## 2021-12-11 DIAGNOSIS — R262 Difficulty in walking, not elsewhere classified: Secondary | ICD-10-CM | POA: Insufficient documentation

## 2021-12-11 NOTE — Therapy (Signed)
?OUTPATIENT PHYSICAL THERAPY TREATMENT NOTE ? ? ?Patient Name: Travis Fitzpatrick ?MRN: [id number] ?DOB:November 20, 1980, 41 y.o., male ?Today's Date: 12/11/2021 ? ? PT End of Session - 12/11/21 2013   ? ? Visit Number 11   ? Number of Visits 24   ? Date for PT Re-Evaluation 01/24/22   ? Authorization Type MEDICARE PART B reporting period from 11/01/2021   ? Progress Note Due on Visit 10   ? PT Start Time 1905   ? PT Stop Time 1945   ? PT Time Calculation (min) 40 min   ? Equipment Utilized During Treatment Gait belt   ? Activity Tolerance Patient tolerated treatment well   ? Behavior During Therapy [facility] for tasks assessed/performed   ? ?  ?  ? ?  ? ? ? ? ? ? ? ? ? ? ? ? ?Past Medical History:  ?Diagnosis Date  ? Bowel trouble   ? urgency  ? Medical history non-contributory   ? Urinary urgency   ? ?Past Surgical History:  ?Procedure Laterality Date  ? BACK SURGERY  2010  ? CIRCUMCISION    ? LUMBAR LAMINECTOMY/DECOMPRESSION MICRODISCECTOMY  07/20/2011  ? Procedure: LUMBAR LAMINECTOMY/DECOMPRESSION MICRODISCECTOMY;  Surgeon: Carmela Hurt;  Location: [facility];  Service: Neurosurgery;  Laterality: N/A;  right thoracotomy with thoracic eight-nine discectomy and fusion  ? LUMBAR LAMINECTOMY/DECOMPRESSION MICRODISCECTOMY Right 10/06/2021  ? Procedure: Right Lumbar Four-Five Microdiscectomy, Right Lumbar Five-Sacal One Foraminotomy;  Surgeon: Coletta Memos, MD;  Location: [facility] OR;  Service: Neurosurgery;  Laterality: Right;  3C/RM 21  ? THORACIC DISCECTOMY  07/16/2012  ? Procedure: THORACIC DISCECTOMY;  Surgeon: Carmela Hurt, MD;  Location: [facility];  Service: Neurosurgery;  Laterality: Right;  RIGHT Thoracic seven-eight  thoracic diskectomy via thoracotomy by dr Laneta Simmers  ? THORACIC DISCECTOMY N/A 12/15/2014  ? Procedure: THORACIC SEVEN TO THORACIC NINE Laminectomy ;  Surgeon: Coletta Memos, MD;  Location: [facility];  Service: Neurosurgery;  Laterality: N/A;  ? THORACIC DISCECTOMY N/A 06/06/2017  ? Procedure: LAMINECTOMY THORACIC  NINE-TEN;  Surgeon: Coletta Memos, MD;  Location: [facility] OR;  Service: Neurosurgery;  Laterality: N/A;  LAMINECTOMY THORACIC NINE-TEN  ? THORACOTOMY  07/20/2011  ? Procedure: THORACOTOMY OPEN FOR SPINE SURGERY;  Surgeon: D Karle Plumber, MD;  Location: [facility];  Service: Vascular;  Laterality: N/A;  ? THORACOTOMY  07/16/2012  ? Procedure: THORACOTOMY OPEN FOR SPINE SURGERY;  Surgeon: Alleen Borne, MD;  Location: [facility];  Service: Thoracic;  Laterality: N/A;  ? ?Patient Active Problem List  ? Diagnosis Date Noted  ? HNP (herniated nucleus pulposus), lumbar 10/06/2021  ? Hand pain 06/28/2021  ? Lumbar facet arthropathy 01/11/2021  ? Thoracic spondylosis with myelopathy 01/11/2021  ? Thoracic spinal stenosis 06/06/2017  ? Stenosis, spinal, thoracic 12/15/2014  ? 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/2011  ? ? ?[facility]: [facility] [place] ? ?REFERRING PROVIDER: Coletta Memos, MD ? 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 ?THERAPY DIAG:  ?Other low back pain ? ?Pain in thoracic spine ? ?Muscle weakness (generalized) ? ?Other abnormalities of gait and mobility ? ?Difficulty in walking, not elsewhere classified ? ?Repeated falls ? ?ONSET DATE: Suddenly started having weakness 10 years ago, most recent episode of worsening October 2022, s/p right L4-5 laminotomy, microdiscectomy on 10/06/2021. ? ?PERTINENT HISTORY: Patient is a 41 y.o. male who presents to outpatient physical therapy with a  referral for medical diagnosis  intervertebral disc disorder of thoracic region with myelopathy. This patient's chief complaints consist of low back pain, R LE pain/paresthesia/weakness, L LE weakness s/p right L4-5 laminotomy, microdiscectomy on 10/06/2021 leading to the following functional deficits: difficulty with bed mobility, transfers, driving, household and community ambulation,  preventing falls, floor transfers, playing with his daughter, stairs, social participation and socializing with friends. 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    Patient denies hx of cancer, stroke, seizures, lung problems, heart problems, diabetes, unexplained weight loss, and osteoporosis. Patient reports he has difficulty with making it to the bathroom on time when he gets bowel or bladder urges and has stumbling. He has history of thoracic and lumbar surgeries. ? ?PRECAUTIONS: Other: no lifting > 10 lbs, no bending/twisting at waist (for at least 2 more weeks).  ? ?PATIENT GOALS: get better and get off the walker to walk with [facility], play with his daughter (51months), get his strength back, be able to get up and down from floor. ? ?SUBJECTIVE: Patient reports that he tried to pick up his daughter on Friday and she dropped her weight. He heard something pop in his back when this happened and now he hears it when he walks (pop on right side glute region as he steps on R LE). He has no continued worsened low back or leg weakness. It hurt in his back when it happened, and he did have intermittent throbbing on the right medial ankle and it has improved but not gone. He reports he felt good after last PT session. Pain did not go up after last PT session.  ? ?PAIN:  ?Are you having pain? 2/10 ?Location: surgical site at low back ?OBJECTIVE ? ? ?TODAY'S TREATMENT  ?Therapeutic exercise: to centralize symptoms and improve ROM, strength, muscular endurance, and activity tolerance required for successful completion of functional activities.  ?- ambulation 836 feet  in 6 minutes with SPC in R LE and SBA. ? ?Superset: ?- B knee extension at [facility] machine, 3x10 at 35# ?- B hamstring curl at [facility] machine, 3x10 at 25#  ? ?Neuromuscular Re-education: to improve, balance, postural strength, muscle activation patterns, and stabilization strength required for functional activities: ?- standing forward sways on airex pad with UE support as needed. 1 min with SBA.  ?- alternating toe taps on 8.5 inch surface with 1-2 finger support with SBA. 2x10 each side.  ?- standing balance with eyes closed on airex pad with B UE support as needed, 2x1 min. CGA-SBA.  ?- standing balance with eyes closed on firm surface, 1x1 min. CGA.  ?- static balance in corner: varying stance, head turns, eyes closed and educating how to perform safely at home.  ? ?HOME EXERCISE PROGRAM: ? Access Code: [email or web address] ?URL: [email or web address] ?Date: 11/13/2021 ?Prepared by: Norton Blizzard ? ?Exercises ?- Sit to Stand  - 1 x weekly - 2-3 sets - 5-10 reps ?- Seated Isometric Hip Adduction with Ball  - 1 x daily - 2-3 sets - 5-10 reps - 5 seconds hold ?- Seated Toe Raise  - 1 x daily - 2-3 sets - 10 reps ?- Seated Single Arm Shoulder Row with Anchored Resistance  - 1 x daily -  3 sets - 10 reps ? ?PATIENT EDUCATION:  ?Education details: Exercise purpose/form. Self management techniques. HEP ?Person educated: Patient ?Education method: Explanation, Demonstration, Tactile cues, and Verbal cues ?Education comprehension: verbalized understanding, returned demonstration, verbal cues required, tactile cues required, and needs further education ? ? ?ASSESSMENT: ? ?CLINICAL IMPRESSION: ?Patient tolerated treatment well with on increase in pain no obvious decline in function since last PT session. He continued to work on static and dynamic balance, LE strength, and mobility. Advised patient to speak with his doctor about the popping and R foot throbbing at his follow up appointment tomorrow. Plan to continue working on  balance, mobility, and functional strength as appropriate next session.  ? ?Patient is a 41 y.o. male referred to outpatient physical therapy with a medical diagnosis of intervertebral disc disorder of th

## 2021-12-13 ENCOUNTER — Encounter: Payer: Self-pay | Admitting: Physical Therapy

## 2021-12-13 ENCOUNTER — Encounter: Payer: Medicare Other | Admitting: Physical Therapy

## 2021-12-13 ENCOUNTER — Ambulatory Visit: Payer: Medicare Other | Admitting: Physical Therapy

## 2021-12-13 DIAGNOSIS — R262 Difficulty in walking, not elsewhere classified: Secondary | ICD-10-CM

## 2021-12-13 DIAGNOSIS — M546 Pain in thoracic spine: Secondary | ICD-10-CM

## 2021-12-13 DIAGNOSIS — R296 Repeated falls: Secondary | ICD-10-CM

## 2021-12-13 DIAGNOSIS — R2689 Other abnormalities of gait and mobility: Secondary | ICD-10-CM

## 2021-12-13 DIAGNOSIS — M6281 Muscle weakness (generalized): Secondary | ICD-10-CM

## 2021-12-13 DIAGNOSIS — M5459 Other low back pain: Secondary | ICD-10-CM | POA: Diagnosis not present

## 2021-12-13 NOTE — Therapy (Signed)
?OUTPATIENT PHYSICAL THERAPY TREATMENT NOTE ? ? ?Patient Name: Travis Fitzpatrick ?MRN: [id number] ?DOB:January 23, 1981, 41 y.o., male ?Today's Date: 12/13/2021 ? ? PT End of Session - 12/13/21 1952   ? ? Visit Number 12   ? Number of Visits 24   ? Date for PT Re-Evaluation 01/24/22   ? Authorization Type MEDICARE PART B reporting period from 11/01/2021   ? Progress Note Due on Visit 10   ? PT Start Time 1815   ? PT Stop Time 1900   ? PT Time Calculation (min) 45 min   ? Equipment Utilized During Treatment Gait belt   ? Activity Tolerance Patient limited by pain   ? [facility]/performed   ? ?  ?  ? ?  ? ? ? ? ? ? ? ? ? ? ? ? ? ?Past Medical History:  ?Diagnosis Date  ? Bowel trouble   ? urgency  ? Medical history non-contributory   ? Urinary urgency   ? ?Past Surgical History:  ?Procedure Laterality Date  ? BACK SURGERY  2010  ? CIRCUMCISION    ? LUMBAR LAMINECTOMY/DECOMPRESSION MICRODISCECTOMY  07/20/2011  ? Procedure: LUMBAR LAMINECTOMY/DECOMPRESSION MICRODISCECTOMY;  Surgeon: Carmela HurtKyle L Cabbell;  Location: [facility];  Service: Neurosurgery;  Laterality: N/A;  right thoracotomy with thoracic eight-nine discectomy and fusion  ? LUMBAR LAMINECTOMY/DECOMPRESSION MICRODISCECTOMY Right 10/06/2021  ? Procedure: Right Lumbar Four-Five Microdiscectomy, Right Lumbar Five-Sacal One Foraminotomy;  Surgeon: Coletta Memosabbell, Kyle, MD;  Location: [facility] OR;  Service: Neurosurgery;  Laterality: Right;  3C/RM 21  ? THORACIC DISCECTOMY  07/16/2012  ? Procedure: THORACIC DISCECTOMY;  Surgeon: Carmela HurtKyle L Cabbell, MD;  Location: [facility];  Service: Neurosurgery;  Laterality: Right;  RIGHT Thoracic seven-eight  thoracic diskectomy via thoracotomy by dr Laneta Simmersbartle  ? THORACIC DISCECTOMY N/A 12/15/2014  ? Procedure: THORACIC SEVEN TO THORACIC NINE Laminectomy ;  Surgeon: Coletta MemosKyle Cabbell, MD;  Location: [facility];  Service: Neurosurgery;  Laterality: N/A;  ? THORACIC DISCECTOMY N/A 06/06/2017  ? Procedure: LAMINECTOMY THORACIC  NINE-TEN;  Surgeon: Coletta Memosabbell, Kyle, MD;  [facility];  Service: Neurosurgery;  Laterality: N/A;  LAMINECTOMY THORACIC NINE-TEN  ? THORACOTOMY  07/20/2011  ? Procedure: THORACOTOMY OPEN FOR SPINE SURGERY;  Surgeon: D Karle PlumberPatrick Burney, MD;  Location: [facility];  Service: Vascular;  Laterality: N/A;  ? THORACOTOMY  07/16/2012  ? Procedure: THORACOTOMY OPEN FOR SPINE SURGERY;  Surgeon: Alleen BorneBryan K Bartle, MD;  Location: [facility];  Service: Thoracic;  Laterality: N/A;  ? ?Patient Active Problem List  ? Diagnosis Date Noted  ? HNP (herniated nucleus pulposus), lumbar 10/06/2021  ? Hand pain 06/28/2021  ? Lumbar facet arthropathy 01/11/2021  ? Thoracic spondylosis with myelopathy 01/11/2021  ? Thoracic spinal stenosis 06/06/2017  ? Stenosis, spinal, thoracic 12/15/2014  ? 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/2011  ? ? ?[facility] [place] ? ?REFERRING PROVIDER: Coletta Memosabbell, Kyle, MD ? 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 ?THERAPY DIAG:  ?Other low back pain ? ?Pain in thoracic spine ? ?Muscle weakness (generalized) ? ?Other abnormalities of gait and mobility ? ?Difficulty in walking, not elsewhere classified ? ?Repeated falls ? ?ONSET DATE: Suddenly started having weakness 10 years ago, most recent episode of worsening October 2022, s/p right L4-5 laminotomy, microdiscectomy on 10/06/2021. ? ?PERTINENT HISTORY: Patient is a 41 y.o. male who presents to outpatient physical therapy with  a referral for medical diagnosis  intervertebral disc disorder of thoracic region with myelopathy. This patient's chief complaints consist of low back pain, R LE pain/paresthesia/weakness, L LE weakness s/p right L4-5 laminotomy, microdiscectomy on 10/06/2021 leading to the following functional deficits: difficulty with bed mobility, transfers, driving, household and community ambulation,  preventing falls, floor transfers, playing with his daughter, stairs, social participation and socializing with friends. 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    Patient denies hx of cancer, stroke, seizures, lung problems, heart problems, diabetes, unexplained weight loss, and osteoporosis. Patient reports he has difficulty with making it to the bathroom on time when he gets bowel or bladder urges and has stumbling. He has history of thoracic and lumbar surgeries. ? ?PRECAUTIONS: Other: no lifting > 10 lbs, no bending/twisting at waist (at least until 01/13/2022).  ? ?PATIENT GOALS: get better and get off the walker to walk with [facility], play with his daughter (28months), get his strength back, be able to get up and down from floor. ? ?SUBJECTIVE: Patient reports he is feeling well today and he has the usual pain at his low back of 2/10 and no leg pain upon arrival. He saw his surgeon yesterday who did some tests and was not concerned about the popping he is experiencing each time he steps onto the right LE. He states he still has some throbbing pain at the right foot after prolonged standing. He states his surgeon extended his precautions for another 4 weeks.  ? ?PAIN:  ?Are you having pain? 2/10 ?Location: surgical site at low back ?OBJECTIVE ? ? ?TODAY'S TREATMENT  ?Therapeutic exercise: to centralize symptoms and improve ROM, strength, muscular endurance, and activity tolerance required for successful completion of functional activities.  ?- ambulation 952 feet in 6 minutes with SPC in R LE and SBA (break/pause for bathroom break). ?- standing pallof press  with blue theraband, 1x10 each side. CGA with chair in front.  ?- standing mini rotation (mostly from hips) with arms in pallof position with blue theraband, 2x10 each side. CGA with chair in front.  ?(Patient reported numbness in left toes at end of last set with anchor on left).  ?- seated rest - numbness slightly better ? - sit <> stand from large plinth set to 20.5 inches high, 1x10 hands free.  ?- seated sciatic nerve glide (slider technique) 1x15 each side (numbness improving).  ? - sit <> stand from large plinth set to 20.5/21.5 inches high, 2x10 while holding 4.4 lb med ball SBA. Walker in front. ?- seated moving 6.6 med ball side to side across body, 2x20 each direction.  ? - sit <> stand from large plinth set to 21.5 inches high, 1x10 while holding 6.6 lb med ball and moving it closer to body, SBA. Walker in front. (Patient requested to complete another set).  ?- ambulation ~ 100 feet from clinic to vehicle with rollator and SBA for safety and to assess symptom response.  ? ?Pt required multimodal cuing for proper technique and to facilitate improved neuromuscular control, strength, range of motion, and functional ability resulting in improved performance and form. ? ?HOME EXERCISE PROGRAM: ? Access Code: [email or web address] ?URL: [email or web address] ?Date: 11/13/2021 ?Prepared by: Huntley Dec  Mattelyn Imhoff ? ?Exercises ?- Sit to Stand  - 1 x weekly - 2-3 sets - 5-10 reps ?- Seated Isometric Hip Adduction with Ball  - 1 x daily - 2-3 sets - 5-10 reps - 5 seconds hold ?- Seated Toe Raise  - 1 x daily - 2-3 sets - 10 reps ?- Seated Single Arm Shoulder Row with Anchored Resistance  - 1 x daily - 3 sets - 10 reps ? ?PATIENT EDUCATION:  ?Education details: Exercise purpose/form. Self management techniques. HEP ?Person educated: Patient ?Education method: Explanation, Demonstration, Tactile cues, and Verbal cues ?Education comprehension: verbalized understanding, returned demonstration, verbal cues required, tactile cues  required, and needs further education ? ? ?ASSESSMENT: ? ?CLINICAL IMPRESSION: ?Patient tolerated treatment with some difficulty due to sudden onset of numbness in the left toes after standing pallof press and

## 2021-12-18 ENCOUNTER — Encounter: Payer: Medicare Other | Admitting: Physical Therapy

## 2021-12-19 ENCOUNTER — Encounter: Payer: Medicare Other | Admitting: Physical Therapy

## 2021-12-20 ENCOUNTER — Encounter: Payer: Medicare Other | Admitting: Physical Therapy

## 2021-12-20 NOTE — Therapy (Signed)
?OUTPATIENT PHYSICAL THERAPY TREATMENT NOTE ? ? ?Patient Name: Travis Fitzpatrick ?MRN: [id number] ?DOB:04-Feb-1981, 41 y.o., male ?Today's Date: 12/21/2021 ? ? PT End of Session - 12/21/21 1937   ? ? Visit Number 13   ? Number of Visits 24   ? Date for PT Re-Evaluation 01/24/22   ? Authorization Type MEDICARE PART B reporting period from 11/01/2021   ? Progress Note Due on Visit 10   ? PT Start Time 1820   ? PT Stop Time 1903   ? PT Time Calculation (min) 43 min   ? Equipment Utilized During Treatment Gait belt   ? Activity Tolerance Patient limited by pain;Patient tolerated treatment well   ? [facility] tasks assessed/performed   ? ?  ?  ? ?  ? ? ? ? ? ? ? ? ? ? ? ? ? ? ?Past Medical History:  ?Diagnosis Date  ? Bowel trouble   ? urgency  ? Medical history non-contributory   ? Urinary urgency   ? ?Past Surgical History:  ?Procedure Laterality Date  ? BACK SURGERY  2010  ? CIRCUMCISION    ? LUMBAR LAMINECTOMY/DECOMPRESSION MICRODISCECTOMY  07/20/2011  ? Procedure: LUMBAR LAMINECTOMY/DECOMPRESSION MICRODISCECTOMY;  Surgeon: Carmela HurtKyle L Cabbell;  Location: [facility];  Service: Neurosurgery;  Laterality: N/A;  right thoracotomy with thoracic eight-nine discectomy and fusion  ? LUMBAR LAMINECTOMY/DECOMPRESSION MICRODISCECTOMY Right 10/06/2021  ? Procedure: Right Lumbar Four-Five Microdiscectomy, Right Lumbar Five-Sacal One Foraminotomy;  Surgeon: Coletta Memosabbell, Kyle, MD;  Location: [facility] OR;  Service: Neurosurgery;  Laterality: Right;  3C/RM 21  ? THORACIC DISCECTOMY  07/16/2012  ? Procedure: THORACIC DISCECTOMY;  Surgeon: Carmela HurtKyle L Cabbell, MD;  Location: [facility];  Service: Neurosurgery;  Laterality: Right;  RIGHT Thoracic seven-eight  thoracic diskectomy via thoracotomy by dr Laneta Simmersbartle  ? THORACIC DISCECTOMY N/A 12/15/2014  ? Procedure: THORACIC SEVEN TO THORACIC NINE Laminectomy ;  Surgeon: Coletta MemosKyle Cabbell, MD;  Location: [facility];  Service: Neurosurgery;  Laterality: N/A;  ? THORACIC DISCECTOMY N/A 06/06/2017  ?  Procedure: LAMINECTOMY THORACIC NINE-TEN;  Surgeon: Coletta Memosabbell, Kyle, MD;  [facility];  Service: Neurosurgery;  Laterality: N/A;  LAMINECTOMY THORACIC NINE-TEN  ? THORACOTOMY  07/20/2011  ? Procedure: THORACOTOMY OPEN FOR SPINE SURGERY;  Surgeon: D Karle PlumberPatrick Burney, MD;  Location: [facility];  Service: Vascular;  Laterality: N/A;  ? THORACOTOMY  07/16/2012  ? Procedure: THORACOTOMY OPEN FOR SPINE SURGERY;  Surgeon: Alleen BorneBryan K Bartle, MD;  Location: [facility];  Service: Thoracic;  Laterality: N/A;  ? ?Patient Active Problem List  ? Diagnosis Date Noted  ? HNP (herniated nucleus pulposus), lumbar 10/06/2021  ? Hand pain 06/28/2021  ? Lumbar facet arthropathy 01/11/2021  ? Thoracic spondylosis with myelopathy 01/11/2021  ? Thoracic spinal stenosis 06/06/2017  ? Stenosis, spinal, thoracic 12/15/2014  ? 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/2011  ? ? ?PCP: [facility] [place] ? ?REFERRING PROVIDER: Coletta Memosabbell, Kyle, MD ? 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 ?THERAPY DIAG:  ?Other low back pain ? ?Pain in thoracic spine ? ?Muscle weakness (generalized) ? ?Other abnormalities of gait and mobility ? ?Difficulty in walking, not elsewhere classified ? ?Repeated falls ? ?ONSET DATE: Suddenly started having weakness 10 years ago, most recent episode of worsening October 2022, s/p right L4-5 laminotomy, microdiscectomy on 10/06/2021. ? ?PERTINENT HISTORY: Patient is a 41 y.o. male who presents to  outpatient physical therapy with a referral for medical diagnosis  intervertebral disc disorder of thoracic region with myelopathy. This patient's chief complaints consist of low back pain, R LE pain/paresthesia/weakness, L LE weakness s/p right L4-5 laminotomy, microdiscectomy on 10/06/2021 leading to the following functional deficits: difficulty with bed mobility, transfers, driving,  household and community ambulation, preventing falls, floor transfers, playing with his daughter, stairs, social participation and socializing with friends. 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    Patient denies hx of cancer, stroke, seizures, lung problems, heart problems, diabetes, unexplained weight loss, and osteoporosis. Patient reports he has difficulty with making it to the bathroom on time when he gets bowel or bladder urges and has stumbling. He has history of thoracic and lumbar surgeries. ? ?PRECAUTIONS: Other: no lifting > 10 lbs, no bending/twisting at waist (at least until 01/13/2022).  ? ?PATIENT GOALS: get better and get off the walker to walk with [facility], play with his daughter (80months), get his strength back, be able to get up and down from floor. ? ?SUBJECTIVE: Patient reports he had shooting pain in his left foot and leg when he left PT last session. It progressively gotten better and has not had shooting pain since once on Saturday. He states his left leg has been swollen but is going down. He has not spoken to his doctor about it. He noticed the swelling the day he left PT. Patient reports he has 2/10 low back pain currently. HEP has been going well and he has been doing them every day. He has not walked as much as he did before due to the pain in his leg but he got up to his prior walking level yesterday. He states there has been no change in his bowel or bladder problems. He thinks the popping in the right side of his low back is getting better but he is not sure if he doesn't remember it happening all the time because he has tuned it  out or if it is happening less.  ? ?PAIN:  ?Are you having pain? 2/10 ?Location: surgical site at low back ?OBJECTIVE ? ? ?TODAY'S TREATMENT  ?Therapeutic exercise: to centralize symptoms and improve ROM, strength, muscular endurance, and activity tolerance required for successful completion of functional activities.  ?- NuStep level 5 using bilateral lower extremities. Seat setting 11. For improved extremity mobility, muscular endurance, and activity tolerance; and to induce the analgesic effect of aerobic exercise, stimulate improved joint nutrition, and prepare body structures and systems for following interventions. x 5  minutes. Average SPM = 75. ?- ambulation 900 feet in 6 minutes with SPC in R LE and SBA (break/pause for bathroom break).  ? - sit <> stand from large plinth set to 20.5 inches high, 1x10 hands free. Rollator in front.  ? ?SEATED ON LARGE DYNADISK ok plinth with feet on 4 inch step:  ?- seated alternating ball placed side to side of trunk  with  3kg med ball, 1x20 each way side to side, 2x10 each side chops/scoops.  ?- ball toss at various angles: lightweight green ball 1 bout, 1kg med ball x2 bouts, (attempted 3kg med ball but discontinued due to pain from catching). Lost his  balance backwards twice with use of B UE to catch himself.  ?- pallof press with blue theraband anchored on right, 1x5 (discontinued due to being easy) ? ?- attempted sit <> stand form 19.5 inch surface (unable).  ? - sit <> stand from large plinth set to 20.5 inches high, 1x10 hands free. Rollator in front.  ? ? ?Pt required multimodal cuing for proper technique and to facilitate improved neuromuscular control, strength, range of motion, and functional ability resulting in improved performance and form. ? ?HOME EXERCISE PROGRAM: ? Access Code: [email or web address] ?URL: [email or web address] ?Date: 11/13/2021 ?Prepared by: Norton Blizzard ? ?Exercises ?- Sit to Stand  - 1 x weekly - 2-3 sets - 5-10 reps ?- Seated Isometric  Hip Adduction with Ball  - 1 x daily - 2-3 sets - 5-10 reps - 5 seconds hold ?- Seated Toe Raise  - 1 x daily - 2-3 sets - 10 reps ?- Seated Single Arm Shoulder Row with Anchored Resistance  - 1 x daily - 3 se

## 2021-12-21 ENCOUNTER — Ambulatory Visit: Payer: Medicare Other | Admitting: Physical Therapy

## 2021-12-21 ENCOUNTER — Encounter: Payer: Self-pay | Admitting: Physical Therapy

## 2021-12-21 DIAGNOSIS — R262 Difficulty in walking, not elsewhere classified: Secondary | ICD-10-CM

## 2021-12-21 DIAGNOSIS — M6281 Muscle weakness (generalized): Secondary | ICD-10-CM

## 2021-12-21 DIAGNOSIS — M5459 Other low back pain: Secondary | ICD-10-CM | POA: Diagnosis not present

## 2021-12-21 DIAGNOSIS — M546 Pain in thoracic spine: Secondary | ICD-10-CM

## 2021-12-21 DIAGNOSIS — R2689 Other abnormalities of gait and mobility: Secondary | ICD-10-CM

## 2021-12-21 DIAGNOSIS — R296 Repeated falls: Secondary | ICD-10-CM

## 2021-12-21 NOTE — Therapy (Signed)
?OUTPATIENT PHYSICAL THERAPY TREATMENT NOTE ? ? ?Patient Name: Travis Fitzpatrick ?MRN: [id number] ?DOB:March 03, 1981, 41 y.o., male ?Today's Date: 12/25/2021 ? ? PT End of Session - 12/25/21 2002   ? ? Visit Number 14   ? Number of Visits 24   ? Date for PT Re-Evaluation 01/24/22   ? Authorization Type MEDICARE PART B reporting period from 11/01/2021   ? Progress Note Due on Visit 10   ? PT Start Time 1820   ? PT Stop Time 1900   ? PT Time Calculation (min) 40 min   ? Equipment Utilized During Treatment Gait belt   ? Activity Tolerance Patient tolerated treatment well   ? Behavior During Therapy [facility] for tasks assessed/performed   ? ?  ?  ? ?  ? ? ? ? ? ? ? ? ? ? ? ? ? ? ? ?Past Medical History:  ?Diagnosis Date  ? Bowel trouble   ? urgency  ? Medical history non-contributory   ? Urinary urgency   ? ?Past Surgical History:  ?Procedure Laterality Date  ? BACK SURGERY  2010  ? CIRCUMCISION    ? LUMBAR LAMINECTOMY/DECOMPRESSION MICRODISCECTOMY  07/20/2011  ? Procedure: LUMBAR LAMINECTOMY/DECOMPRESSION MICRODISCECTOMY;  Surgeon: Carmela Hurt;  [facility]: [facility];  Service: Neurosurgery;  Laterality: N/A;  right thoracotomy with thoracic eight-nine discectomy and fusion  ? LUMBAR LAMINECTOMY/DECOMPRESSION MICRODISCECTOMY Right 10/06/2021  ? Procedure: Right Lumbar Four-Five Microdiscectomy, Right Lumbar Five-Sacal One Foraminotomy;  Surgeon: Coletta Memos, MD;  Location: [facility];  Service: Neurosurgery;  Laterality: Right;  3C/RM 21  ? THORACIC DISCECTOMY  07/16/2012  ? Procedure: THORACIC DISCECTOMY;  Surgeon: Carmela Hurt, MD;  Location: [facility];  Service: Neurosurgery;  Laterality: Right;  RIGHT Thoracic seven-eight  thoracic diskectomy via thoracotomy by dr Laneta Simmers  ? THORACIC DISCECTOMY N/A 12/15/2014  ? Procedure: THORACIC SEVEN TO THORACIC NINE Laminectomy ;  Surgeon: Coletta Memos, MD;  Location: [facility];  Service: Neurosurgery;  Laterality: N/A;  ? THORACIC DISCECTOMY N/A 06/06/2017  ? Procedure: LAMINECTOMY  THORACIC NINE-TEN;  Surgeon: Coletta Memos, MD;  Location: [facility] OR;  Service: Neurosurgery;  Laterality: N/A;  LAMINECTOMY THORACIC NINE-TEN  ? THORACOTOMY  07/20/2011  ? Procedure: THORACOTOMY OPEN FOR SPINE SURGERY;  Surgeon: D Karle Plumber, MD;  Location: [facility];  Service: Vascular;  Laterality: N/A;  ? THORACOTOMY  07/16/2012  ? Procedure: THORACOTOMY OPEN FOR SPINE SURGERY;  Surgeon: Alleen Borne, MD;  Location: [facility];  Service: Thoracic;  Laterality: N/A;  ? ?Patient Active Problem List  ? Diagnosis Date Noted  ? HNP (herniated nucleus pulposus), lumbar 10/06/2021  ? Hand pain 06/28/2021  ? Lumbar facet arthropathy 01/11/2021  ? Thoracic spondylosis with myelopathy 01/11/2021  ? Thoracic spinal stenosis 06/06/2017  ? Stenosis, spinal, thoracic 12/15/2014  ? 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/2011  ? ? ?[facility] [place] ? ?REFERRING PROVIDER: Coletta Memos, MD ? 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 ?THERAPY DIAG:  ?Other low back pain ? ?Pain in thoracic spine ? ?Muscle weakness (generalized) ? ?Other abnormalities of gait and mobility ? ?Difficulty in walking, not elsewhere classified ? ?Repeated falls ? ?ONSET DATE: Suddenly started having weakness 10 years ago, most recent episode of worsening October 2022, s/p right L4-5 laminotomy, microdiscectomy on 10/06/2021. ? ?PERTINENT HISTORY: Patient is a 41 y.o. male who presents to outpatient physical  therapy with a referral for medical diagnosis  intervertebral disc disorder of thoracic region with myelopathy. This patient's chief complaints consist of low back pain, R LE pain/paresthesia/weakness, L LE weakness s/p right L4-5 laminotomy, microdiscectomy on 10/06/2021 leading to the following functional deficits: difficulty with bed mobility, transfers, driving, household and community  ambulation, preventing falls, floor transfers, playing with his daughter, stairs, social participation and socializing with friends. 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    Patient denies hx of cancer, stroke, seizures, lung problems, heart problems, diabetes, unexplained weight loss, and osteoporosis. Patient reports he has difficulty with making it to the bathroom on time when he gets bowel or bladder urges and has stumbling. He has history of thoracic and lumbar surgeries. ? ?PRECAUTIONS: Other: no lifting > 10 lbs, no bending/twisting at waist (at least until 01/13/2022).  ? ?PATIENT GOALS: get better and get off the [facility]er [facility], play with his daughter (15months), get his strength back, be able to get up and down from floor. ? ?SUBJECTIVE: Patient reports he is feeling well and felt better after last PT session. He is using a SPC to ambulate around the home but uses his rollator in the community.  ? ?PAIN:  ?Are you having pain? 2/10 ?Location: surgical site at low back ?OBJECTIVE ? ? ?TODAY'S TREATMENT  ?Therapeutic exercise: to centralize symptoms and improve ROM, strength, muscular endurance, and activity tolerance required for successful completion of functional activities.  ?- ambulation 940 feet in 6 minutes with SPC in R LE and SBA-CGA. Two stumbles catching R foot on floor in last 3 min of task.  ? - sit <> stand from large plinth set to 20.5 inches high, 1x10 hands free. Rollator in front.  ? ?SEATED ON LARGE DYNADISK on plinth with feet on 4 inch step:  ?- ball toss at various angles: 1kg med ball 1x20 with purposeful tossing  outside of BOS (occasional need for use of hands to prevent falling over).  ?- seated alternating ball placed side to side of trunk  with  3kg med ball, 2x10 each side chops/scoops.  ?- kettlebell halo, 1x10 each side (pain in shoulders), 10#KB ?- kettlebell pass around, 1x10 each side, 10#KB ?- ball toss at various angles: 2kg med ball 2x20 with purposeful tossing towards.  ?- seated marching, 1x10 each side (needed UE support), 1x5 each side.  ? ?- sit <> stand form 19.5 inch surface, 1x1 after several tries.  ? - sit <> stand from large plinth set to 20.5 inches high, 2x10 hands free. Rollator in front.  ? ?Pt required multimodal cuing for proper technique and to facilitate improved neuromuscular control, strength, range of motion, and functional ability resulting in improved performance and form. ? ?HOME EXERCISE PROGRAM: ? Access Code: [email or web address] ?URL: [email or web address] ?Date: 11/13/2021 ?Prepared by: Norton BlizzardSara Pellegrino Kennard ? ?Exercises ?- Sit to Stand  - 1 x weekly - 2-3 sets - 5-10 reps ?- Seated Isometric Hip Adduction with Ball  - 1 x daily - 2-3 sets - 5-10 reps - 5 seconds hold ?- Seated Toe Raise  - 1 x daily - 2-3 sets - 10 reps ?- Seated Single Arm Shoulder Row with Anchored Resistance  -  1 x daily - 3 sets - 10 reps ? ?PATIENT EDUCATION:  ?Education details: Exercise purpose/form. Self management techniques. HEP ?Person educated: Patient ?Education method: Explanation, Demonstration, Tactile cues, and Verbal cues ?Education comprehension: verbalized understanding, returned demonstration, verbal cues required, tactile cues required, and needs further education ? ? ?ASSESSMENT: ? ?CLINICAL IMPRESSION: ?Patient tolerated treatment well overall and reported no increase in pain by end of session. He continued working on functional mobility, balance, core strength and functional strength this session and continues to demonstrate improving ability to perform more difficult tasks. Plan to continue with  interventions directed and strengthening functional mobility and balance as appropriate next session. Patient would benefit from continued management of limiting condition by skilled physical therapist to address r

## 2021-12-25 ENCOUNTER — Encounter: Payer: Self-pay | Admitting: Physical Therapy

## 2021-12-25 ENCOUNTER — Encounter: Payer: Medicare Other | Admitting: Physical Therapy

## 2021-12-25 ENCOUNTER — Ambulatory Visit: Payer: Medicare Other | Admitting: Physical Therapy

## 2021-12-25 DIAGNOSIS — R2689 Other abnormalities of gait and mobility: Secondary | ICD-10-CM

## 2021-12-25 DIAGNOSIS — M546 Pain in thoracic spine: Secondary | ICD-10-CM

## 2021-12-25 DIAGNOSIS — M5459 Other low back pain: Secondary | ICD-10-CM

## 2021-12-25 DIAGNOSIS — R296 Repeated falls: Secondary | ICD-10-CM

## 2021-12-25 DIAGNOSIS — R262 Difficulty in walking, not elsewhere classified: Secondary | ICD-10-CM

## 2021-12-25 DIAGNOSIS — M6281 Muscle weakness (generalized): Secondary | ICD-10-CM

## 2021-12-27 ENCOUNTER — Encounter: Payer: Medicare Other | Admitting: Physical Therapy

## 2021-12-28 ENCOUNTER — Ambulatory Visit: Payer: Medicare Other | Admitting: Physical Therapy

## 2021-12-28 ENCOUNTER — Encounter: Payer: Self-pay | Admitting: Physical Therapy

## 2021-12-28 DIAGNOSIS — R296 Repeated falls: Secondary | ICD-10-CM

## 2021-12-28 DIAGNOSIS — M6281 Muscle weakness (generalized): Secondary | ICD-10-CM

## 2021-12-28 DIAGNOSIS — R2689 Other abnormalities of gait and mobility: Secondary | ICD-10-CM

## 2021-12-28 DIAGNOSIS — R262 Difficulty in walking, not elsewhere classified: Secondary | ICD-10-CM

## 2021-12-28 DIAGNOSIS — M546 Pain in thoracic spine: Secondary | ICD-10-CM

## 2021-12-28 DIAGNOSIS — M5459 Other low back pain: Secondary | ICD-10-CM | POA: Diagnosis not present

## 2021-12-28 NOTE — Therapy (Signed)
OUTPATIENT PHYSICAL THERAPY TREATMENT NOTE   Patient Name: Travis Fitzpatrick MRN: [id number] DOB:[birth year], 41 y.o., male Today's Date: 12/28/2021   PT End of Session - 12/28/21 1827     Visit Number 15    Number of Visits 24    Date for PT Re-Evaluation 01/24/22    Authorization Type MEDICARE PART B reporting period from 11/01/2021    Progress Note Due on Visit 10    PT Start Time 1825    PT Stop Time 1905    PT Time Calculation (min) 40 min    Equipment Utilized During Treatment Gait belt    Activity Tolerance Patient tolerated treatment well    Behavior During Therapy WFL for tasks assessed/performed                           Past Medical History:  Diagnosis Date   Bowel trouble    urgency   Medical history non-contributory    Urinary urgency    Past Surgical History:  Procedure Laterality Date   BACK SURGERY  2010   CIRCUMCISION     LUMBAR LAMINECTOMY/DECOMPRESSION MICRODISCECTOMY  07/20/2011   Procedure: LUMBAR LAMINECTOMY/DECOMPRESSION MICRODISCECTOMY;  Surgeon: Winfield Cunas;  Location: [place] [facility];  Service: Neurosurgery;  Laterality: N/A;  right thoracotomy with thoracic eight-nine discectomy and fusion   LUMBAR LAMINECTOMY/DECOMPRESSION MICRODISCECTOMY Right 10/06/2021   Procedure: Right Lumbar Four-Five Microdiscectomy, Right Lumbar Five-Sacal One Foraminotomy;  Surgeon: Ashok Pall, MD;  Location: [place];  Service: Neurosurgery;  Laterality: Right;  3C/RM 21   THORACIC DISCECTOMY  07/16/2012   Procedure: THORACIC DISCECTOMY;  Surgeon: Winfield Cunas, MD;  Location: [place] [facility];  Service: Neurosurgery;  Laterality: Right;  RIGHT Thoracic seven-eight  thoracic diskectomy via thoracotomy by dr Cyndia Bent   THORACIC DISCECTOMY N/A 12/15/2014   Procedure: THORACIC SEVEN TO THORACIC NINE Laminectomy ;  Surgeon: Ashok Pall, MD;  Location: [place] [facility];  Service: Neurosurgery;  Laterality: N/A;   THORACIC DISCECTOMY N/A 06/06/2017   Procedure: LAMINECTOMY  THORACIC NINE-TEN;  Surgeon: Ashok Pall, MD;  Location: [place];  Service: Neurosurgery;  Laterality: N/A;  LAMINECTOMY THORACIC NINE-TEN   THORACOTOMY  07/20/2011   Procedure: THORACOTOMY OPEN FOR SPINE SURGERY;  Surgeon: Pierre Bali, MD;  Location: [facility];  Service: Vascular;  Laterality: N/A;   THORACOTOMY  07/16/2012   Procedure: THORACOTOMY OPEN FOR SPINE SURGERY;  Surgeon: Gaye Pollack, MD;  Location: [facility];  Service: Thoracic;  Laterality: N/A;   Patient Active Problem List   Diagnosis Date Noted   HNP (herniated nucleus pulposus), lumbar 10/06/2021   Hand pain 06/28/2021   Lumbar facet arthropathy 01/11/2021   Thoracic spondylosis with myelopathy 01/11/2021   Thoracic spinal stenosis 06/06/2017   Stenosis, spinal, thoracic 12/15/2014   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 09/22/2014   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/2011    PCP: [facility] [place]  REFERRING PROVIDER: Ashok Pall, MD  REFERRING DIAG: 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  PERTINENT HISTORY: Patient is a  41 y.o. 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 This patient's chief complaints consist of low back pain, R LE pain/paresthesia/weakness, L LE weakness s/p right L4-5 laminotomy, microdiscectomy on 10/06/2021 leading to the following functional deficits: difficulty with bed  mobility, transfers, driving, household and community ambulation, preventing falls, floor transfers, playing with his daughter, stairs, social participation and socializing with friends. 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    Patient denies hx of cancer, stroke, seizures, lung problems, heart problems, diabetes, unexplained weight loss, and osteoporosis. Patient reports he has difficulty with making it to the bathroom on time when he gets bowel or bladder urges and has stumbling. He has history of thoracic and lumbar surgeries.  PRECAUTIONS: Other: no lifting > 10 lbs, no bending/twisting at waist (at least until 01/13/2022).   PATIENT GOALS: get better and get off the walker to walk with [facility], play with his daughter (43month), get his strength back, be able to get up and down from floor.  SUBJECTIVE: Patient arrives with rollator. He states he is pretty tired today after not sleeping well due to his young daughter. He states he continues to have about the same amount of back pain.   PAIN:  Are you having pain? 2/10 Location: surgical site at low back OBJECTIVE   TODAY'S TREATMENT  Therapeutic exercise: to centralize symptoms and improve ROM, strength, muscular endurance, and activity tolerance required for successful completion of functional activities.  - NuStep level 5 using bilateral lower extremities. Seat setting 11. For improved extremity mobility, muscular endurance, and activity tolerance; and to induce the analgesic effect of aerobic exercise, stimulate improved joint nutrition, and prepare body structures and systems for  following interventions. x 5  minutes. Average SPM = 75. - step up to 6 inch step with B UE support, 2x10 each side.  - modified mountain climbers (marching while leaning forwards with B UE on TM bar), 1x10 each side.   Neuromuscular Re-education: to improve, balance, postural strength, muscle activation patterns, and stabilization strength required for functional activities: - standing forward to neutral sway on airex pad, 2x10, SBA - static standing with eyes closed on airex pad, 3x1 min, self selected stance, occasional UE support on TM bar and eye opening. SBA - "Clock Yourself" game with R and L half of clock next to TM bar for occasional UE support. 1x1 min each side, 1x2 min each side. CGA. 392SPM.   Pt required multimodal cuing for proper technique and to facilitate improved neuromuscular control, strength, range of motion, and functional ability resulting in improved performance and form.  HOME EXERCISE PROGRAM:  Access Code: [email or web address] URL: [email or web address] Date: 11/13/2021 Prepared by: SRosita Kea Exercises - Sit to Stand  - 1 x weekly - 2-3 sets - 5-10 reps - Seated Isometric Hip Adduction with Ball  - 1 x daily - 2-3 sets - 5-10 reps - 5 seconds hold - Seated Toe Raise  - 1 x daily - 2-3 sets - 10 reps - Seated Single Arm Shoulder Row with Anchored Resistance  - 1 x daily - 3 sets - 10 reps  PATIENT EDUCATION:  Education details: Exercise purpose/form. Self management techniques. HEP Person educated: Patient Education method: Explanation, Demonstration, Tactile cues, and Verbal cues Education comprehension: verbalized understanding, returned demonstration, verbal cues required, tactile cues required, and needs further education   ASSESSMENT:  CLINICAL IMPRESSION: Patient tolerated treatment well overall with some increased feeling of tension in bilateral calves and anterior thighs by end of session. Patient complete all exercises without seated breaks  except initial seated activity on nustep. Session focused on LE and core strength as well as functional balance. Plan to continue working on progressive functional/core/LE strength and balance as appropriate. Patient would benefit from continued management of limiting condition by skilled physical therapist to address remaining impairments and functional limitations to work towards stated goals and return to PLOF or maximal functional independence.   Patient is a 41 y.o. 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   Patient presents with significant pain, ROM, joint stiffness, sensation, balance, gait, muscle performance (strength, power, endurance), activity tolerance impairments that are limiting ability to complete his usual activities and basic mobility such as bed mobility, transfers, driving, household and community ambulation, preventing falls, floor transfers, playing with his daughter, stairs, social participation and socializing with friends without difficulty. This condition decreases patients quality of life and increases his risk of injury from falls. Patient will benefit from skilled physical therapy intervention to address current body structure impairments and activity limitations to improve function and work towards goals set in current POC in order to return to prior level of function or maximal functional improvement.    OBJECTIVE IMPAIRMENTS Abnormal gait, decreased activity tolerance, decreased balance, decreased coordination, decreased endurance, decreased knowledge of  use of DME, decreased mobility, difficulty walking, decreased ROM, decreased strength, impaired perceived functional ability, impaired flexibility, impaired sensation, impaired tone, impaired UE functional use, postural dysfunction, obesity, and pain.   ACTIVITY LIMITATIONS cleaning, community activity, driving, occupation, laundry, shopping, and basic mobility such as bed mobility, transfers, driving, household and community ambulation, preventing falls, floor transfers, playing with his daughter, stairs, social participation and socializing with friends .   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    REHAB POTENTIAL: Good  CLINICAL DECISION MAKING: Evolving/moderate complexity  EVALUATION COMPLEXITY: Moderate   GOALS: Goals reviewed with patient? No  SHORT TERM GOALS: Target date: 11/15/2021  Be independent with initial home exercise program for self-management of symptoms. Baseline: initial HEP to be provided at visit 2 as appropriate (11/01/2021);  Goal status: MET  LONG TERM GOALS: Target date: 01/24/2022  Be independent with a long-term home exercise program for self-management of symptoms.  Baseline: Initial HEP to be provided at visit 2 as appropriate (11/01/2021); patient currently participating well (12/06/2021);  Goal status:  IN PROGRESS  2.  Demonstrate improved FOTO to equal or greater than 42 by visit #14 to demonstrate improvement in overall condition and self-reported  functional ability.  Baseline: 26 (11/01/2021); 45 at visit #10 (12/06/2021);  Goal status: ACHIEVED 12/06/2021  3.  Patient will complete 5 Time Sit To Stand Test in equal or less than 15 seconds for 18.5 inch surface with no UE support to demonstrate improved ability to complete transfers and decrease fall risk.  Baseline: 45 second from 18.5 inch plinth with R UE support on plinth and L UE support on locked rollator and B UE support on rollator in standing. Multiple attempts needed on some reps (11/01/2021); 21 second from 18.5 inch plinth with B UE support on plinth. Walker positioned in front of him for safety but did not need it. (12/06/2021); Marland Kitchen  Goal status: IN PROGRESS  4.  Patient will ambulate equal or greater than 1200 feet on 6 Minute Walk Test with [facility]  to demonstrate improved household and community mobility as well as community and social participation.  Baseline: to be tested visit 2 as appropriate (11/01/2021); 800 feet with rollator. Low back pain increased to 4-5/10 and had pain down R LE to calf (11/13/2021); 943 feet with SPC and SBA (two little standing breaks to stretch back due to tightness at low back, increased sharper pain in back and B legs, 12/06/2021);  Goal status: IN PROGRESS  5.  Complete community, work and/or recreational activities without limitation due to current condition.  Baseline: difficulty with bed mobility, transfers, driving, household and community ambulation, preventing falls, floor transfers, playing with his daughter, stairs, social participation and socializing with friends (11/01/2021); walking is getting easier, transfers are easier, doesn't have to use his rollator as much, stairs are still troublesome, has not tried floor transfer, still restricted socially and continues to have significant limitations with functional activities and mobility (12/06/2021);  Goal status: IN PROGRESS     PLAN: PT FREQUENCY: 2x/week  PT DURATION: 12 weeks  PLANNED  INTERVENTIONS: Therapeutic exercises, Therapeutic activity, Neuromuscular re-education, Balance training, Gait training, Patient/Family education, Joint mobilization, Stair training, DME instructions, Dry Needling, and Electrical stimulation.  PLAN FOR NEXT SESSION: Plan to continue with similar strengthening progression and consider starting step up as tolerated next session to help patient be able to go to the movie theater and use a seat in higher in the theater.   Everlean Alstrom. Graylon Good, PT, DPT 12/28/21, 7:31 PM  [facility] [address] [place], [place] [address] P: [phone] I F: [phone]

## 2022-01-01 ENCOUNTER — Ambulatory Visit: Payer: Medicare Other | Admitting: Physical Therapy

## 2022-01-01 ENCOUNTER — Encounter: Payer: Medicare Other | Admitting: Physical Therapy

## 2022-01-01 ENCOUNTER — Encounter: Payer: Self-pay | Admitting: Physical Therapy

## 2022-01-01 DIAGNOSIS — M5459 Other low back pain: Secondary | ICD-10-CM | POA: Diagnosis not present

## 2022-01-01 DIAGNOSIS — M546 Pain in thoracic spine: Secondary | ICD-10-CM

## 2022-01-01 DIAGNOSIS — R296 Repeated falls: Secondary | ICD-10-CM

## 2022-01-01 DIAGNOSIS — M6281 Muscle weakness (generalized): Secondary | ICD-10-CM

## 2022-01-01 DIAGNOSIS — R262 Difficulty in walking, not elsewhere classified: Secondary | ICD-10-CM

## 2022-01-01 DIAGNOSIS — R2689 Other abnormalities of gait and mobility: Secondary | ICD-10-CM

## 2022-01-01 NOTE — Therapy (Signed)
OUTPATIENT PHYSICAL THERAPY TREATMENT NOTE   Patient Name: Travis Fitzpatrick MRN: [id number] DOB:05/28/1981, 41 y.o., male Today's Date: 01/01/2022   PT End of Session - 01/01/22 2011     Visit Number 16    Number of Visits 24    Date for PT Re-Evaluation 01/24/22    Authorization Type MEDICARE PART B reporting period from 11/01/2021    Progress Note Due on Visit 10    PT Start Time 1820    PT Stop Time 1900    PT Time Calculation (min) 40 min    Equipment Utilized During Treatment Gait belt    Activity Tolerance Patient limited by pain    Behavior During Therapy [facility] for tasks assessed/performed               Past Medical History:  Diagnosis Date   Bowel trouble    urgency   Medical history non-contributory    Urinary urgency    Past Surgical History:  Procedure Laterality Date   BACK SURGERY  2010   CIRCUMCISION     LUMBAR LAMINECTOMY/DECOMPRESSION MICRODISCECTOMY  07/20/2011   Procedure: LUMBAR LAMINECTOMY/DECOMPRESSION MICRODISCECTOMY;  Surgeon: Winfield Cunas;  Location: [place] [facility];  Service: Neurosurgery;  Laterality: N/A;  right thoracotomy with thoracic eight-nine discectomy and fusion   LUMBAR LAMINECTOMY/DECOMPRESSION MICRODISCECTOMY Right 10/06/2021   Procedure: Right Lumbar Four-Five Microdiscectomy, Right Lumbar Five-Sacal One Foraminotomy;  Surgeon: Ashok Pall, MD;  Location: [place];  Service: Neurosurgery;  Laterality: Right;  3C/RM 21   THORACIC DISCECTOMY  07/16/2012   Procedure: THORACIC DISCECTOMY;  Surgeon: Winfield Cunas, MD;  Location: [place] [facility];  Service: Neurosurgery;  Laterality: Right;  RIGHT Thoracic seven-eight  thoracic diskectomy via thoracotomy by dr Cyndia Bent   THORACIC DISCECTOMY N/A 12/15/2014   Procedure: THORACIC SEVEN TO THORACIC NINE Laminectomy ;  Surgeon: Ashok Pall, MD;  Location: [place] [facility];  Service: Neurosurgery;  Laterality: N/A;   THORACIC DISCECTOMY N/A 06/06/2017   Procedure: LAMINECTOMY THORACIC NINE-TEN;  Surgeon:  Ashok Pall, MD;  Location: [place];  Service: Neurosurgery;  Laterality: N/A;  LAMINECTOMY THORACIC NINE-TEN   THORACOTOMY  07/20/2011   Procedure: THORACOTOMY OPEN FOR SPINE SURGERY;  Surgeon: Pierre Bali, MD;  Location: [facility];  Service: Vascular;  Laterality: N/A;   THORACOTOMY  07/16/2012   Procedure: THORACOTOMY OPEN FOR SPINE SURGERY;  Surgeon: Gaye Pollack, MD;  Location: [place] [facility];  Service: Thoracic;  Laterality: N/A;   Patient Active Problem List   Diagnosis Date Noted   HNP (herniated nucleus pulposus), lumbar 10/06/2021   Hand pain 06/28/2021   Lumbar facet arthropathy 01/11/2021   Thoracic spondylosis with myelopathy 01/11/2021   Thoracic spinal stenosis 06/06/2017   Stenosis, spinal, thoracic 12/15/2014   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 09/22/2014   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/2011    PCP: [facility] [place]  REFERRING PROVIDER: Ashok Pall, MD  REFERRING DIAG: 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  PERTINENT HISTORY: Patient is a 41 y.o. male who presents to outpatient physical therapy with a referral  for medical diagnosis  intervertebral disc disorder of thoracic region with myelopathy. This patient's chief complaints consist of low back pain, R LE pain/paresthesia/weakness, L LE weakness s/p right L4-5 laminotomy, microdiscectomy on 10/06/2021 leading to the following functional deficits: difficulty with bed mobility, transfers, driving,  household and community ambulation, preventing falls, floor transfers, playing with his daughter, stairs, social participation and socializing with friends. 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    Patient denies hx of cancer, stroke, seizures, lung problems, heart problems, diabetes, unexplained weight loss, and osteoporosis. Patient reports he has difficulty with making it to the bathroom on time when he gets bowel or bladder urges and has stumbling. He has history of thoracic and lumbar surgeries.  PRECAUTIONS: Other: no lifting > 10 lbs, no bending/twisting at waist (at least until 01/13/2022). Don't lay on stomach.    PATIENT GOALS: get better and get off the walker to walk with [facility], play with his daughter (52month), get his strength back, be able to get up and down from floor.  SUBJECTIVE: Patient reports his low back pain is elevated today to 4/10. He went for a walk at the park yesterday and his back started hurting more after he stood up following post walk rest. His pain has been about the same since yesterday.  He reports no leg pain upon arrival but continues to have loss of sensation in B LE.   PAIN:  Are you having pain? 4/10 Location: surgical site at low back OBJECTIVE   TODAY'S TREATMENT  Therapeutic exercise: to centralize symptoms and improve ROM, strength, muscular endurance, and activity tolerance required for successful completion of functional activities.  - Treadmill 0.8 mph at 0-2% grade with B UE support. For improved lower extremity mobility, muscular endurance, and weightbearing activity tolerance; and to induce  the analgesic effect of aerobic exercise, stimulate improved joint nutrition, and prepare body structures and systems for following interventions. x 4:16 minutes. Required assistance to operate machine.  - step up to 6 inch step with B UE support, 2x10 each side.   Manual therapy: to reduce pain and tissue tension, improve range of motion, neuromodulation, in order to promote improved ability to complete functional activities. SEATED leaning on high plinth (17 inch chair).  - STM with and without foam roller to posterior trunk muscles, focusing on thoracic and lumbar paraspinals.  - Scar massage to lower lumbar incision (patient reports pain going into legs and numbness coming on as he is there, discontinued due to paresthesia/numbness).   Neuromuscular Re-education: to improve, balance, postural strength, muscle activation patterns, and stabilization strength required for functional activities: - standing forward to neutral sway on airex pad, 1x10, SBA (discontinued due to pain in R toes).  - static standing with eyes closed on airex pad, 1x1 min, self selected stance, occasional UE support on TM bar and eye opening. SBA (discontinued due to pain in toe).   SEATED ON LARGE DYNADISK on plinth with feet on 4 inch step:  - seated alternating ball placed side to side of trunk  with  2kg med ball, 2x10 each side chops/scoops.  - seated sciatic nerve glides, slider technique, 1x15 each side.  - seated marching, 1x10 each  side (needed UE support), - pallof press, 2x10 each side with blue theraband.   Pt required multimodal cuing for proper technique and to facilitate improved neuromuscular control, strength, range of motion, and functional ability resulting in improved performance and form.  HOME EXERCISE PROGRAM:  Access Code: [email or web address] URL: [email or web address] Date: 11/13/2021 Prepared by: Rosita Kea  Exercises - Sit to Stand  - 1 x weekly - 2-3 sets - 5-10 reps - Seated  Isometric Hip Adduction with Ball  - 1 x daily - 2-3 sets - 5-10 reps - 5 seconds hold - Seated Toe Raise  - 1 x daily - 2-3 sets - 10 reps - Seated Single Arm Shoulder Row with Anchored Resistance  - 1 x daily - 3 sets - 10 reps  PATIENT EDUCATION:  Education details: Exercise purpose/form. Self management techniques. HEP Person educated: Patient Education method: Explanation, Demonstration, Tactile cues, and Verbal cues Education comprehension: verbalized understanding, returned demonstration, verbal cues required, tactile cues required, and needs further education   ASSESSMENT:  CLINICAL IMPRESSION: Patient tolerated treatment with some difficulty with increased low back pain from 4/10 to 6/10 and increased pain and paresthesia in R LE that started while seated for manual therapy. Patient also demonstrated decreased balance and comfort in standing and had shooting pain in the right toes with weight shift forwards. Exercises were modified today to accommodate tolerance and improve safety. Patient initially reported soft tissue mobilization to lumbar and thoracic paraspinals decreased pain but reported increased pain following. Plan to continue with core/LE/functional strengthening and balance as tolerated next session as appropriate. Will continue to monitor for changes in function and contact MD when appropriate. Patient would benefit from continued management of limiting condition by skilled physical therapist to address remaining impairments and functional limitations to work towards stated goals and return to PLOF or maximal functional independence.   Patient is a 41 y.o. 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   Patient presents with significant pain, ROM, joint stiffness, sensation, balance, gait, muscle performance (strength, power, endurance), activity tolerance impairments that are limiting ability to complete his usual activities and basic mobility such as bed mobility, transfers, driving, household and community ambulation, preventing falls, floor transfers, playing with his daughter, stairs, social participation and socializing with friends without difficulty. This condition decreases patients quality of life and increases his risk of injury from falls. Patient will benefit from skilled physical therapy intervention to address current body structure impairments and activity limitations to improve function and work towards goals set in current POC in order to return to prior level of function or maximal functional improvement.    OBJECTIVE IMPAIRMENTS Abnormal gait, decreased activity tolerance, decreased balance, decreased coordination, decreased endurance, decreased knowledge of use of DME, decreased mobility, difficulty walking, decreased ROM, decreased strength, impaired perceived functional ability, impaired flexibility, impaired sensation, impaired tone, impaired UE functional use, postural dysfunction, obesity, and pain.   ACTIVITY LIMITATIONS cleaning, community activity, driving, occupation, laundry, shopping, and basic mobility such as bed mobility, transfers, driving, household and community ambulation, preventing falls, floor transfers, playing with his daughter, stairs, social participation and  socializing with friends .   PERSONAL FACTORS Fitness, Past/current experiences, Profession, Social background, Time since onset of injury/illness/exacerbation, and 3+ comorbidities: 3 thoracic spine surgeries, thoracic MRI notes "Myelomalacia with severe cord atrophy  from T7 through T9-10 and mild decreased volume the remainder of the thoracic cord, stable" s/p right L4-5 laminotomy, microdiscectomy on 10/06/2021, history of pressure to cauda equina, thoracic disc disease with myelopathy, hand pain, urinary and bowel urgency  are also affecting patient's functional outcome.    REHAB POTENTIAL: Good  CLINICAL DECISION MAKING: Evolving/moderate complexity  EVALUATION COMPLEXITY: Moderate   GOALS: Goals reviewed with patient? No  SHORT TERM GOALS: Target date: 11/15/2021  Be independent with initial home exercise program for self-management of symptoms. Baseline: initial HEP to be provided at visit 2 as appropriate (11/01/2021);  Goal status: MET  LONG TERM GOALS: Target date: 01/24/2022  Be independent with a long-term home exercise program for self-management of symptoms.  Baseline: Initial HEP to be provided at visit 2 as appropriate (11/01/2021); patient currently participating well (12/06/2021);  Goal status: IN PROGRESS  2.  Demonstrate improved FOTO to equal or greater than 42 by visit #14 to demonstrate improvement in overall condition and self-reported functional ability.  Baseline: 26 (11/01/2021); 45 at visit #10 (12/06/2021);  Goal status: ACHIEVED 12/06/2021  3.  Patient will complete 5 Time Sit To Stand Test in equal or less than 15 seconds for 18.5 inch surface with no UE support to demonstrate improved ability to complete transfers and decrease fall risk.  Baseline: 45 second from 18.5 inch plinth with R UE support on plinth and L UE support on locked rollator and B UE support on rollator in standing. Multiple attempts needed on some reps (11/01/2021); 21 second from 18.5 inch plinth with B UE support on plinth. Walker positioned in front of him for safety but did not need it. (12/06/2021); Marland Kitchen  Goal status: IN PROGRESS  4.  Patient will ambulate equal or greater than 1200 feet on 6 Minute Walk Test with [facility]  to demonstrate improved household and  community mobility as well as community and social participation.  Baseline: to be tested visit 2 as appropriate (11/01/2021); 800 feet with rollator. Low back pain increased to 4-5/10 and had pain down R LE to calf (11/13/2021); 943 feet with SPC and SBA (two little standing breaks to stretch back due to tightness at low back, increased sharper pain in back and B legs, 12/06/2021);  Goal status: IN PROGRESS  5.  Complete community, work and/or recreational activities without limitation due to current condition.  Baseline: difficulty with bed mobility, transfers, driving, household and community ambulation, preventing falls, floor transfers, playing with his daughter, stairs, social participation and socializing with friends (11/01/2021); walking is getting easier, transfers are easier, doesn't have to use his rollator as much, stairs are still troublesome, has not tried floor transfer, still restricted socially and continues to have significant limitations with functional activities and mobility (12/06/2021);  Goal status: IN PROGRESS     PLAN: PT FREQUENCY: 2x/week  PT DURATION: 12 weeks  PLANNED INTERVENTIONS: Therapeutic exercises, Therapeutic activity, Neuromuscular re-education, Balance training, Gait training, Patient/Family education, Joint mobilization, Stair training, DME instructions, Dry Needling, and Electrical stimulation.  PLAN FOR NEXT SESSION: Plan to continue with similar strengthening progression and consider starting step up as tolerated next session to help patient be able to go to the movie theater and use a seat in higher in the theater.   Everlean Alstrom. Graylon Good, PT, DPT 01/01/22,  8:18 PM  [facility] [address] [place], [place] [address] P: [phone] I F: [phone]

## 2022-01-02 NOTE — Therapy (Unsigned)
OUTPATIENT PHYSICAL THERAPY TREATMENT NOTE   Patient Name: Travis Fitzpatrick MRN: [id number] DOB:July 08, 1981, 41 y.o., male Today's Date: 01/03/2022   PT End of Session - 01/03/22 1900     Visit Number 17    Number of Visits 24    Date for PT Re-Evaluation 01/24/22    Authorization Type MEDICARE PART B reporting period from 11/01/2021    Progress Note Due on Visit 10    PT Start Time 1820    PT Stop Time 1900    PT Time Calculation (min) 40 min    Equipment Utilized During Treatment Gait belt    Activity Tolerance Patient limited by pain;Patient tolerated treatment well    Behavior During Therapy [facility] for tasks assessed/performed                Past Medical History:  Diagnosis Date   Bowel trouble    urgency   Medical history non-contributory    Urinary urgency    Past Surgical History:  Procedure Laterality Date   BACK SURGERY  2010   CIRCUMCISION     LUMBAR LAMINECTOMY/DECOMPRESSION MICRODISCECTOMY  07/20/2011   Procedure: LUMBAR LAMINECTOMY/DECOMPRESSION MICRODISCECTOMY;  Surgeon: Winfield Cunas;  Location: [place] [facility];  Service: Neurosurgery;  Laterality: N/A;  right thoracotomy with thoracic eight-nine discectomy and fusion   LUMBAR LAMINECTOMY/DECOMPRESSION MICRODISCECTOMY Right 10/06/2021   Procedure: Right Lumbar Four-Five Microdiscectomy, Right Lumbar Five-Sacal One Foraminotomy;  Surgeon: Ashok Pall, MD;  Location: [place];  Service: Neurosurgery;  Laterality: Right;  3C/RM 21   THORACIC DISCECTOMY  07/16/2012   Procedure: THORACIC DISCECTOMY;  Surgeon: Winfield Cunas, MD;  Location: [place] [facility];  Service: Neurosurgery;  Laterality: Right;  RIGHT Thoracic seven-eight  thoracic diskectomy via thoracotomy by dr Cyndia Bent   THORACIC DISCECTOMY N/A 12/15/2014   Procedure: THORACIC SEVEN TO THORACIC NINE Laminectomy ;  Surgeon: Ashok Pall, MD;  Location: [place] [facility];  Service: Neurosurgery;  Laterality: N/A;   THORACIC DISCECTOMY N/A 06/06/2017   Procedure: LAMINECTOMY  THORACIC NINE-TEN;  Surgeon: Ashok Pall, MD;  Location: [place];  Service: Neurosurgery;  Laterality: N/A;  LAMINECTOMY THORACIC NINE-TEN   THORACOTOMY  07/20/2011   Procedure: THORACOTOMY OPEN FOR SPINE SURGERY;  Surgeon: Pierre Bali, MD;  Location: [facility];  Service: Vascular;  Laterality: N/A;   THORACOTOMY  07/16/2012   Procedure: THORACOTOMY OPEN FOR SPINE SURGERY;  Surgeon: Gaye Pollack, MD;  Location: [place] [facility];  Service: Thoracic;  Laterality: N/A;   Patient Active Problem List   Diagnosis Date Noted   HNP (herniated nucleus pulposus), lumbar 10/06/2021   Hand pain 06/28/2021   Lumbar facet arthropathy 01/11/2021   Thoracic spondylosis with myelopathy 01/11/2021   Thoracic spinal stenosis 06/06/2017   Stenosis, spinal, thoracic 12/15/2014   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 09/22/2014   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/2011    PCP: [facility] [place]  REFERRING PROVIDER: Ashok Pall, MD  REFERRING DIAG: 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  PERTINENT HISTORY: Patient is a 41 y.o. male who presents to outpatient physical  therapy with a referral for medical diagnosis  intervertebral disc disorder of thoracic region with myelopathy. This patient's chief complaints consist of low back pain, R LE pain/paresthesia/weakness, L LE weakness s/p right L4-5 laminotomy, microdiscectomy on 10/06/2021 leading to the following functional deficits: difficulty with bed  mobility, transfers, driving, household and community ambulation, preventing falls, floor transfers, playing with his daughter, stairs, social participation and socializing with friends. 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    Patient denies hx of cancer, stroke, seizures, lung problems, heart problems, diabetes, unexplained weight loss, and osteoporosis. Patient reports he has difficulty with making it to the bathroom on time when he gets bowel or bladder urges and has stumbling. He has history of thoracic and lumbar surgeries.  PRECAUTIONS: Other: no lifting > 10 lbs, no bending/twisting at waist (at least until 01/13/2022). Don't lay on stomach.    PATIENT GOALS: get better and get off the walker to walk with [facility], play with his daughter (29month), get his strength back, be able to get up and down from floor.  SUBJECTIVE: Patient reports he is doing okay today. He still has elevated back pain and pain towards his right hip. He states he laid down after last PT session and the next day and his right leg pain improved. He continues to feel his bowels and bladder affect his pain so that it is worse when he feels the need to go to the bathroom. He arrives with his rollator.    PAIN:  Are you having pain? 3/10 Location: surgical site at low back and towards R hip OBJECTIVE   TODAY'S TREATMENT  Therapeutic exercise: to centralize symptoms and improve ROM, strength, muscular endurance, and activity tolerance required for successful completion of functional activities.  - NuStep level 6 using bilateral upper and lower extremities. Seat setting  11. For improved extremity mobility, muscular endurance, and activity tolerance; and to induce the analgesic effect of aerobic exercise, stimulate improved joint nutrition, and prepare body structures and systems for following interventions. x 6  minutes. - sit <> stand from 20.5 inch plinth with no UE support, rollator placed in front of body for safety, 3x10. - step up to 6 inch step with B UE support, 2x10 each side. (Seated break for other seated exercises between set).  - reverse deadmill 3x1 min plus additional time for practice. SBA.   Neuromuscular Re-education: to improve, balance, postural strength, muscle activation patterns, and stabilization strength required for functional activities:   SEATED ON LARGE DYNADISK on plinth with feet on 4 inch step:  - seated ball toss, 1kg ball 1x1 min, 2kg ball 1x1 min.  - seated marching, 2x10 each side (needed UE support), - seated alternating ball placed side to side of trunk  with  2kg med ball, 2x10 each side chops/scoops.   Pt required multimodal cuing for proper technique and to facilitate improved neuromuscular control, strength, range of motion, and functional ability resulting in improved performance and form.  HOME EXERCISE PROGRAM:  Access Code: [email or web address] URL: [email or web address] Date: 11/13/2021 Prepared by: SRosita Kea Exercises - Sit to Stand  - 1 x weekly - 2-3 sets - 5-10 reps - Seated Isometric Hip Adduction with BDiona Foley -  1 x daily - 2-3 sets - 5-10 reps - 5 seconds hold - Seated Toe Raise  - 1 x daily - 2-3 sets - 10 reps - Seated Single Arm Shoulder Row with Anchored Resistance  - 1 x daily - 3 sets - 10 reps  PATIENT EDUCATION:  Education details: Exercise purpose/form. Self management techniques. HEP Person educated: Patient Education method: Explanation, Demonstration, Tactile cues, and Verbal cues Education comprehension: verbalized understanding, returned demonstration, verbal cues required, tactile  cues required, and needs further education   ASSESSMENT:  CLINICAL IMPRESSION: Patient tolerated treatment well overall with no increase in pain by end of session. Alternated between standing and seated exercises to improve tolerance for standing exercises. Patient continued to have right sided pain that stayed mostly at the top of his pelvis. Patient demonstrated improved strength with sit <> stand exercise but continues to have profound weakness and loss of motor control in B LE. Plan to continue working on functional, LE, and core strength and balance as appropriate next session. Patient would benefit from continued management of limiting condition by skilled physical therapist to address remaining impairments and functional limitations to work towards stated goals and return to PLOF or maximal functional independence.   Patient is a 41 y.o. 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   Patient presents with significant pain, ROM, joint stiffness, sensation, balance, gait, muscle performance (strength, power, endurance), activity tolerance impairments that are limiting ability to complete his usual activities and basic mobility such as bed mobility, transfers, driving, household and community ambulation, preventing falls, floor transfers, playing with his daughter, stairs, social participation and socializing with friends without difficulty. This condition decreases patients quality of life and increases his risk of injury from falls. Patient will benefit from skilled physical therapy  intervention to address current body structure impairments and activity limitations to improve function and work towards goals set in current POC in order to return to prior level of function or maximal functional improvement.    OBJECTIVE IMPAIRMENTS Abnormal gait, decreased activity tolerance, decreased balance, decreased coordination, decreased endurance, decreased knowledge of use of DME, decreased mobility, difficulty walking, decreased ROM, decreased strength, impaired perceived functional ability, impaired flexibility, impaired sensation, impaired tone, impaired UE functional use, postural dysfunction, obesity, and pain.   ACTIVITY LIMITATIONS cleaning, community activity, driving, occupation, laundry, shopping, and basic mobility such as bed mobility, transfers, driving, household and community ambulation, preventing falls, floor transfers, playing with his daughter, stairs, social participation and socializing with friends .   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    REHAB POTENTIAL: Good  CLINICAL DECISION MAKING: Evolving/moderate complexity  EVALUATION COMPLEXITY: Moderate   GOALS: Goals reviewed with patient? No  SHORT TERM GOALS:  Target date: 11/15/2021  Be independent with initial home exercise program for self-management of symptoms. Baseline: initial HEP to be provided at visit 2 as appropriate (11/01/2021);  Goal status: MET  LONG TERM GOALS: Target date: 01/24/2022  Be  independent with a long-term home exercise program for self-management of symptoms.  Baseline: Initial HEP to be provided at visit 2 as appropriate (11/01/2021); patient currently participating well (12/06/2021);  Goal status: IN PROGRESS  2.  Demonstrate improved FOTO to equal or greater than 42 by visit #14 to demonstrate improvement in overall condition and self-reported functional ability.  Baseline: 26 (11/01/2021); 45 at visit #10 (12/06/2021);  Goal status: ACHIEVED 12/06/2021  3.  Patient will complete 5 Time Sit To Stand Test in equal or less than 15 seconds for 18.5 inch surface with no UE support to demonstrate improved ability to complete transfers and decrease fall risk.  Baseline: 45 second from 18.5 inch plinth with R UE support on plinth and L UE support on locked rollator and B UE support on rollator in standing. Multiple attempts needed on some reps (11/01/2021); 21 second from 18.5 inch plinth with B UE support on plinth. Walker positioned in front of him for safety but did not need it. (12/06/2021); Marland Kitchen  Goal status: IN PROGRESS  4.  Patient will ambulate equal or greater than 1200 feet on 6 Minute Walk Test with [facility]  to demonstrate improved household and community mobility as well as community and social participation.  Baseline: to be tested visit 2 as appropriate (11/01/2021); 800 feet with rollator. Low back pain increased to 4-5/10 and had pain down R LE to calf (11/13/2021); 943 feet with SPC and SBA (two little standing breaks to stretch back due to tightness at low back, increased sharper pain in back and B legs, 12/06/2021);  Goal status: IN PROGRESS  5.  Complete community, work and/or recreational activities without limitation due to current condition.  Baseline: difficulty with bed mobility, transfers, driving, household and community ambulation, preventing falls, floor transfers, playing with his daughter, stairs, social participation and socializing with friends (11/01/2021);  walking is getting easier, transfers are easier, doesn't have to use his rollator as much, stairs are still troublesome, has not tried floor transfer, still restricted socially and continues to have significant limitations with functional activities and mobility (12/06/2021);  Goal status: IN PROGRESS     PLAN: PT FREQUENCY: 2x/week  PT DURATION: 12 weeks  PLANNED INTERVENTIONS: Therapeutic exercises, Therapeutic activity, Neuromuscular re-education, Balance training, Gait training, Patient/Family education, Joint mobilization, Stair training, DME instructions, Dry Needling, and Electrical stimulation.  PLAN FOR NEXT SESSION: Plan to continue with similar strengthening progression and consider starting step up as tolerated next session to help patient be able to go to the movie theater and use a seat in higher in the theater.   Everlean Alstrom. Graylon Good, PT, DPT 01/03/22, 7:38 PM  [facility] [address] [place], [place] [address] P: [phone] I F: [phone]

## 2022-01-03 ENCOUNTER — Encounter: Payer: Medicare Other | Admitting: Physical Therapy

## 2022-01-03 ENCOUNTER — Encounter: Payer: Self-pay | Admitting: Physical Therapy

## 2022-01-03 ENCOUNTER — Ambulatory Visit: Payer: Medicare Other | Admitting: Physical Therapy

## 2022-01-03 DIAGNOSIS — R262 Difficulty in walking, not elsewhere classified: Secondary | ICD-10-CM

## 2022-01-03 DIAGNOSIS — R2689 Other abnormalities of gait and mobility: Secondary | ICD-10-CM

## 2022-01-03 DIAGNOSIS — R296 Repeated falls: Secondary | ICD-10-CM

## 2022-01-03 DIAGNOSIS — M6281 Muscle weakness (generalized): Secondary | ICD-10-CM

## 2022-01-03 DIAGNOSIS — M5459 Other low back pain: Secondary | ICD-10-CM | POA: Diagnosis not present

## 2022-01-03 DIAGNOSIS — M546 Pain in thoracic spine: Secondary | ICD-10-CM

## 2022-01-04 NOTE — Therapy (Signed)
OUTPATIENT PHYSICAL THERAPY TREATMENT NOTE   Patient Name: Travis Fitzpatrick MRN: [id number] DOB:[birth year], 41 y.o., male Today's Date: 01/09/2022   PT End of Session - 01/09/22 1917     Visit Number 18    Number of Visits 24    Date for PT Re-Evaluation 01/24/22    Authorization Type MEDICARE PART B reporting period from 11/01/2021    Progress Note Due on Visit 10    PT Start Time 1816    PT Stop Time 1903    PT Time Calculation (min) 47 min    Equipment Utilized During Treatment Gait belt    Activity Tolerance Patient tolerated treatment well;No increased pain    Behavior During Therapy [facility] for tasks assessed/performed                 Past Medical History:  Diagnosis Date   Bowel trouble    urgency   Medical history non-contributory    Urinary urgency    Past Surgical History:  Procedure Laterality Date   BACK SURGERY  2010   CIRCUMCISION     LUMBAR LAMINECTOMY/DECOMPRESSION MICRODISCECTOMY  07/20/2011   Procedure: LUMBAR LAMINECTOMY/DECOMPRESSION MICRODISCECTOMY;  Surgeon: Winfield Cunas;  Location: [place] [facility];  Service: Neurosurgery;  Laterality: N/A;  right thoracotomy with thoracic eight-nine discectomy and fusion   LUMBAR LAMINECTOMY/DECOMPRESSION MICRODISCECTOMY Right 10/06/2021   Procedure: Right Lumbar Four-Five Microdiscectomy, Right Lumbar Five-Sacal One Foraminotomy;  Surgeon: Ashok Pall, MD;  Location: [place];  Service: Neurosurgery;  Laterality: Right;  3C/RM 21   THORACIC DISCECTOMY  07/16/2012   Procedure: THORACIC DISCECTOMY;  Surgeon: Winfield Cunas, MD;  Location: [place] [facility];  Service: Neurosurgery;  Laterality: Right;  RIGHT Thoracic seven-eight  thoracic diskectomy via thoracotomy by dr Cyndia Bent   THORACIC DISCECTOMY N/A 12/15/2014   Procedure: THORACIC SEVEN TO THORACIC NINE Laminectomy ;  Surgeon: Ashok Pall, MD;  Location: [place] [facility];  Service: Neurosurgery;  Laterality: N/A;   THORACIC DISCECTOMY N/A 06/06/2017   Procedure: LAMINECTOMY  THORACIC NINE-TEN;  Surgeon: Ashok Pall, MD;  Location: [place];  Service: Neurosurgery;  Laterality: N/A;  LAMINECTOMY THORACIC NINE-TEN   THORACOTOMY  07/20/2011   Procedure: THORACOTOMY OPEN FOR SPINE SURGERY;  Surgeon: Pierre Bali, MD;  Location: [facility];  Service: Vascular;  Laterality: N/A;   THORACOTOMY  07/16/2012   Procedure: THORACOTOMY OPEN FOR SPINE SURGERY;  Surgeon: Gaye Pollack, MD;  Location: [place] [facility];  Service: Thoracic;  Laterality: N/A;   Patient Active Problem List   Diagnosis Date Noted   HNP (herniated nucleus pulposus), lumbar 10/06/2021   Hand pain 06/28/2021   Lumbar facet arthropathy 01/11/2021   Thoracic spondylosis with myelopathy 01/11/2021   Thoracic spinal stenosis 06/06/2017   Stenosis, spinal, thoracic 12/15/2014   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 09/22/2014   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/2011    PCP: [facility] [place]  REFERRING PROVIDER: Ashok Pall, MD  REFERRING DIAG: 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  PERTINENT HISTORY: Patient is a 41 y.o. male who presents to outpatient physical  therapy with a referral for medical diagnosis  intervertebral disc disorder of thoracic region with myelopathy. This patient's chief complaints consist of low back pain, R LE pain/paresthesia/weakness, L LE weakness s/p right L4-5 laminotomy, microdiscectomy on 10/06/2021 leading to the following functional deficits: difficulty with bed  mobility, transfers, driving, household and community ambulation, preventing falls, floor transfers, playing with his daughter, stairs, social participation and socializing with friends. 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    Patient denies hx of cancer, stroke, seizures, lung problems, heart problems, diabetes, unexplained weight loss, and osteoporosis. Patient reports he has difficulty with making it to the bathroom on time when he gets bowel or bladder urges and has stumbling. He has history of thoracic and lumbar surgeries.  PRECAUTIONS: Other: no lifting > 10 lbs, no bending/twisting at waist (at least until 01/13/2022). Don't lay on stomach.    PATIENT GOALS: get better and get off the walker to walk with [facility], play with his daughter (60month), get his strength back, be able to get up and down from floor.  SUBJECTIVE: Patient reports he is having a pretty good day. He did not do much this weekend. They had a cookout and he did not stand long. He felt okay after last PT session.  PAIN:  Are you having pain? 2/10 Location: surgical site at low back and he cannot tell he has any pain in his legs.  OBJECTIVE  FUNCTIONAL/BALANCE TESTS 6MWT: 948 feet with SPC  TODAY'S TREATMENT  Therapeutic exercise: to centralize symptoms and improve ROM, strength, muscular endurance, and activity tolerance required for successful completion of functional activities.  - ambulation around the clinic for distance in 6 minutes using SPC and SBA. One breif rest but covered 948 feet.  - sit <> stand from 20.5 inch plinth with no UE support, rollator placed in  front of body for safety, 3x10 (with other exercises in between sets). Need several tries for some reps.  - seated ball to knee drops with small march while dropping small green ball knee to bounce it off, 1x5 each side for LE coordination and speed of movement.  - seated reactive kicks with soccer ball on floor and small green ball in air, kicking ball back and fort to/from PT for improved LE coordination and speed.   Neuromuscular Re-education: to improve, balance, postural strength, muscle activation patterns, and stabilization strength required for functional activities:  - standing static balance on airex pad with self selected stance and eyes closed. SBA and intermittent UE support for safety. 3x1 min.   BIODEX LIMITS OF STABILITY EXERCISE  Feet positioned with heels at B4 and B16 toes at 40 degrees.   Easy difficulty: Trial 1: 1:06 min with 39% (lost balance once) Trial 2: 0:44 min  with 55% Medium difficulty: Trial 3: 1:02 min with 52% (lost balance once)  Pt required multimodal cuing for proper technique and to facilitate improved neuromuscular control, strength, range of motion, and functional ability resulting in improved performance and form.  HOME EXERCISE PROGRAM:  Access Code: [email or web address] URL: [email or web address] Date: 11/13/2021 Prepared by: SRosita Kea Exercises - Sit to Stand  - 1 x weekly - 2-3 sets - 5-10 reps - Seated  Isometric Hip Adduction with Ball  - 1 x daily - 2-3 sets - 5-10 reps - 5 seconds hold - Seated Toe Raise  - 1 x daily - 2-3 sets - 10 reps - Seated Single Arm Shoulder Row with Anchored Resistance  - 1 x daily - 3 sets - 10 reps  PATIENT EDUCATION:  Education details: Exercise purpose/form. Self management techniques. HEP Person educated: Patient Education method: Explanation, Demonstration, Tactile cues, and Verbal cues Education comprehension: verbalized understanding, returned demonstration, verbal cues required, tactile cues  required, and needs further education   ASSESSMENT:  CLINICAL IMPRESSION: Patient tolerated treatment well with no increase in pain by end of session. Patient walked farther than he has before with less rests and pain using SPC during 6MWT. He was also able to progress to sit <> stand from lower surface and successfully coordinated his knees and lower legs with kicking and kneeing a light ball. Patient continues to have profound weakness and limitations in motor control and power that limits his functional mobility. Plan to continue working on LE/core/functional strength, power, coordination, and balance as tolerated next session. Patient would benefit from continued management of limiting condition by skilled physical therapist to address remaining impairments and functional limitations to work towards stated goals and return to PLOF or maximal functional independence.   Patient is a 41 y.o. 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   Patient presents with significant pain, ROM, joint stiffness, sensation, balance, gait, muscle performance (strength, power, endurance), activity tolerance impairments that are limiting ability to complete his usual activities and basic mobility such as bed mobility, transfers, driving, household and community ambulation, preventing falls, floor transfers, playing with his daughter, stairs, social participation and socializing with friends without difficulty. This condition decreases patients quality of life and increases his risk of injury from falls.  Patient will benefit from skilled physical therapy intervention to address current body structure impairments and activity limitations to improve function and work towards goals set in current POC in order to return to prior level of function or maximal functional improvement.    OBJECTIVE IMPAIRMENTS Abnormal gait, decreased activity tolerance, decreased balance, decreased coordination, decreased endurance, decreased knowledge of use of DME, decreased mobility, difficulty walking, decreased ROM, decreased strength, impaired perceived functional ability, impaired flexibility, impaired sensation, impaired tone, impaired UE functional use, postural dysfunction, obesity, and pain.   ACTIVITY LIMITATIONS cleaning, community activity, driving, occupation, laundry, shopping, and basic mobility such as bed mobility, transfers, driving, household and community ambulation, preventing falls, floor transfers, playing with his daughter, stairs, social participation and socializing with friends .   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    REHAB POTENTIAL: Good  CLINICAL DECISION MAKING: Evolving/moderate complexity  EVALUATION COMPLEXITY: Moderate   GOALS: Goals reviewed with patient? No  SHORT TERM GOALS: Target date: 11/15/2021  Be independent with initial home exercise program for self-management of symptoms. Baseline: initial HEP to be provided at visit 2 as appropriate (11/01/2021);  Goal status:  MET  LONG TERM GOALS: Target date: 01/24/2022  Be independent with a long-term home exercise program for self-management of symptoms.  Baseline: Initial HEP to be provided at visit 2 as appropriate (11/01/2021); patient currently participating well (12/06/2021);  Goal status: IN PROGRESS  2.  Demonstrate improved FOTO to equal or greater than 42 by visit #14 to demonstrate improvement in overall condition and self-reported functional ability.  Baseline: 26 (11/01/2021); 45 at visit #10 (12/06/2021);  Goal status: ACHIEVED 12/06/2021  3.  Patient will complete 5 Time Sit To Stand Test in equal or less than 15 seconds for 18.5 inch surface with no UE support to demonstrate improved ability to complete transfers and decrease fall risk.  Baseline: 45 second from 18.5 inch plinth with R UE support on plinth and L UE support on locked rollator and B UE support on rollator in standing. Multiple attempts needed on some reps (11/01/2021); 21 second from 18.5 inch plinth with B UE support on plinth. Walker positioned in front of him for safety but did not need it. (12/06/2021); Marland Kitchen  Goal status: IN PROGRESS  4.  Patient will ambulate equal or greater than 1200 feet on 6 Minute Walk Test with [facility]  to demonstrate improved household and community mobility as well as community and social participation.  Baseline: to be tested visit 2 as appropriate (11/01/2021); 800 feet with rollator. Low back pain increased to 4-5/10 and had pain down R LE to calf (11/13/2021); 943 feet with SPC and SBA (two little standing breaks to stretch back due to tightness at low back, increased sharper pain in back and B legs, 12/06/2021);  948 feet with SPC (01/09/2022);  Goal status: IN PROGRESS  5.  Complete community, work and/or recreational activities without limitation due to current condition.  Baseline: difficulty with bed mobility, transfers, driving, household and community ambulation, preventing falls, floor transfers, playing with his  daughter, stairs, social participation and socializing with friends (11/01/2021); walking is getting easier, transfers are easier, doesn't have to use his rollator as much, stairs are still troublesome, has not tried floor transfer, still restricted socially and continues to have significant limitations with functional activities and mobility (12/06/2021);  Goal status: IN PROGRESS     PLAN: PT FREQUENCY: 2x/week  PT DURATION: 12 weeks  PLANNED INTERVENTIONS: Therapeutic exercises, Therapeutic activity, Neuromuscular re-education, Balance training, Gait training, Patient/Family education, Joint mobilization, Stair training, DME instructions, Dry Needling, and Electrical stimulation.  PLAN FOR NEXT SESSION: Plan to continue with similar strengthening progression and consider starting step up as tolerated next session to help patient be able to go to the movie theater and use a seat in higher in the theater.   Everlean Alstrom. Graylon Good, PT, DPT 01/09/22, 7:18 PM  [facility] [address] [place], [place] [address] P: [phone] I F: [phone]

## 2022-01-09 ENCOUNTER — Encounter: Payer: Self-pay | Admitting: Physical Therapy

## 2022-01-09 ENCOUNTER — Ambulatory Visit: Payer: Medicare Other | Admitting: Physical Therapy

## 2022-01-09 DIAGNOSIS — M5459 Other low back pain: Secondary | ICD-10-CM

## 2022-01-09 DIAGNOSIS — R296 Repeated falls: Secondary | ICD-10-CM

## 2022-01-09 DIAGNOSIS — R2689 Other abnormalities of gait and mobility: Secondary | ICD-10-CM

## 2022-01-09 DIAGNOSIS — R262 Difficulty in walking, not elsewhere classified: Secondary | ICD-10-CM

## 2022-01-09 DIAGNOSIS — M546 Pain in thoracic spine: Secondary | ICD-10-CM

## 2022-01-09 DIAGNOSIS — M6281 Muscle weakness (generalized): Secondary | ICD-10-CM

## 2022-01-10 ENCOUNTER — Encounter: Payer: Medicare Other | Admitting: Physical Therapy

## 2022-01-11 ENCOUNTER — Encounter: Payer: Self-pay | Admitting: Physical Therapy

## 2022-01-11 ENCOUNTER — Ambulatory Visit: Payer: Medicare Other | Attending: Neurosurgery | Admitting: Physical Therapy

## 2022-01-11 DIAGNOSIS — M5459 Other low back pain: Secondary | ICD-10-CM | POA: Insufficient documentation

## 2022-01-11 DIAGNOSIS — M6281 Muscle weakness (generalized): Secondary | ICD-10-CM | POA: Diagnosis present

## 2022-01-11 DIAGNOSIS — R262 Difficulty in walking, not elsewhere classified: Secondary | ICD-10-CM | POA: Diagnosis present

## 2022-01-11 DIAGNOSIS — R296 Repeated falls: Secondary | ICD-10-CM | POA: Diagnosis present

## 2022-01-11 DIAGNOSIS — M546 Pain in thoracic spine: Secondary | ICD-10-CM | POA: Diagnosis present

## 2022-01-11 DIAGNOSIS — R2689 Other abnormalities of gait and mobility: Secondary | ICD-10-CM | POA: Insufficient documentation

## 2022-01-11 NOTE — Therapy (Signed)
OUTPATIENT PHYSICAL THERAPY TREATMENT NOTE   Patient Name: Travis Fitzpatrick MRN: [id number] DOB:Feb 10, 1981, 41 y.o., male Today's Date: 01/11/2022   PT End of Session - 01/11/22 1919     Visit Number 19    Number of Visits 24    Date for PT Re-Evaluation 01/24/22    Authorization Type MEDICARE PART B reporting period from 11/01/2021    Progress Note Due on Visit 10    PT Start Time 1823    PT Stop Time 1906    PT Time Calculation (min) 43 min    Equipment Utilized During Treatment Gait belt    Activity Tolerance Patient tolerated treatment well;No increased pain    Behavior During Therapy [facility] for tasks assessed/performed                  Past Medical History:  Diagnosis Date   Bowel trouble    urgency   Medical history non-contributory    Urinary urgency    Past Surgical History:  Procedure Laterality Date   BACK SURGERY  2010   CIRCUMCISION     LUMBAR LAMINECTOMY/DECOMPRESSION MICRODISCECTOMY  07/20/2011   Procedure: LUMBAR LAMINECTOMY/DECOMPRESSION MICRODISCECTOMY;  Surgeon: Winfield Cunas;  Location: [place] [facility];  Service: Neurosurgery;  Laterality: N/A;  right thoracotomy with thoracic eight-nine discectomy and fusion   LUMBAR LAMINECTOMY/DECOMPRESSION MICRODISCECTOMY Right 10/06/2021   Procedure: Right Lumbar Four-Five Microdiscectomy, Right Lumbar Five-Sacal One Foraminotomy;  Surgeon: Ashok Pall, MD;  Location: [place];  Service: Neurosurgery;  Laterality: Right;  3C/RM 21   THORACIC DISCECTOMY  07/16/2012   Procedure: THORACIC DISCECTOMY;  Surgeon: Winfield Cunas, MD;  Location: [place] [facility]URO ORS;  Service: Neurosurgery;  Laterality: Right;  RIGHT Thoracic seven-eight  thoracic diskectomy via thoracotomy by dr Cyndia Bent   THORACIC DISCECTOMY N/A 12/15/2014   Procedure: THORACIC SEVEN TO THORACIC NINE Laminectomy ;  Surgeon: Ashok Pall, MD;  Location: [place] [facility]URO ORS;  Service: Neurosurgery;  Laterality: N/A;   THORACIC DISCECTOMY N/A 06/06/2017   Procedure: LAMINECTOMY  THORACIC NINE-TEN;  Surgeon: Ashok Pall, MD;  Location: [place];  Service: Neurosurgery;  Laterality: N/A;  LAMINECTOMY THORACIC NINE-TEN   THORACOTOMY  07/20/2011   Procedure: THORACOTOMY OPEN FOR SPINE SURGERY;  Surgeon: Pierre Bali, MD;  Location: [facility];  Service: Vascular;  Laterality: N/A;   THORACOTOMY  07/16/2012   Procedure: THORACOTOMY OPEN FOR SPINE SURGERY;  Surgeon: Gaye Pollack, MD;  Location: [place] NEUR[facility]S;  Service: Thoracic;  Laterality: N/A;   Patient Active Problem List   Diagnosis Date Noted   HNP (herniated nucleus pulposus), lumbar 10/06/2021   Hand pain 06/28/2021   Lumbar facet arthropathy 01/11/2021   Thoracic spondylosis with myelopathy 01/11/2021   Thoracic spinal stenosis 06/06/2017   Stenosis, spinal, thoracic 12/15/2014   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 09/22/2014   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/2011    PCP: [facility] [place]  REFERRING PROVIDER: Ashok Pall, MD  REFERRING DIAG: 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  PERTINENT HISTORY: Patient is a 41 y.o. male who presents to outpatient  physical therapy with a referral for medical diagnosis  intervertebral disc disorder of thoracic region with myelopathy. This patient's chief complaints consist of low back pain, R LE pain/paresthesia/weakness, L LE weakness s/p right L4-5 laminotomy, microdiscectomy on 10/06/2021 leading to the following functional deficits: difficulty with bed  mobility, transfers, driving, household and community ambulation, preventing falls, floor transfers, playing with his daughter, stairs, social participation and socializing with friends. 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    Patient denies hx of cancer, stroke, seizures, lung problems, heart problems, diabetes, unexplained weight loss, and osteoporosis. Patient reports he has difficulty with making it to the bathroom on time when he gets bowel or bladder urges and has stumbling. He has history of thoracic and lumbar surgeries.  PRECAUTIONS: Other: no lifting > 10 lbs, no bending/twisting at waist (at least until 01/13/2022). Don't lay on stomach.    PATIENT GOALS: get better and get off the walker to [facility], play with his daughter (15 months), get his strength back, be able to get up and down from floor.  SUBJECTIVE: Patient reports he is feeling well and has pain in his low back and left posterior thigh upon arrival. He is on his rollator. He states he was sore in his lower back and his thighs after last PT session until the next day. HEP is going well.    PAIN:  Are you having pain? 2/10 Location: surgical site at low back and he cannot tell he has any pain in his legs.  OBJECTIVE  TODAY'S TREATMENT  Therapeutic exercise: to centralize symptoms and improve ROM, strength, muscular endurance, and activity tolerance required for successful completion of functional activities.  - NuStep level 6-7 using bilateral upper and lower extremities. Seat setting 11. For improved extremity mobility, muscular endurance, and activity tolerance;  and to induce the analgesic effect of aerobic exercise, stimulate improved joint nutrition, and prepare body structures and systems for following interventions. x 6 minutes. Average SPM = 73.   - sit <> stand from 20.5 inch plinth with no UE support, rollator placed in front of body for safety, 3x10 (with other exercises in between sets). Need several tries for some reps.    Neuromuscular Re-education: to improve, balance, postural strength, muscle activation patterns, and stabilization strength required for functional activities:   BIODEX LIMITS OF STABILITY EXERCISE  Feet positioned with heels at B4 and B16 toes at 40 degrees.   Medium difficulty: Trial 1: 49 seconds with 59% (lost balance once) Trial 2:  54 seconds with 62% (lost balance once) Hard difficulty: Trial 3: 2:59 min with 14% (could not get to forwards or right lateral and feet moved a bit, suggest trying C4 and C16 for heel placement next time)  - standing ball kicks with CGA and SPC using small green lightweight ball kicked back and forth to rehab tech. ~ 5 min. (Back tired and needed rest).  - standing kicking over cone and righting it using the foot, SPC in ipsilateral UE and TM bar available on contralateral side with CGA for safety. 1x10 each side (needed rest before completing L side due to pain/spasm/fatigue in back muscles).   Pt required multimodal cuing for proper  technique and to facilitate improved neuromuscular control, strength, range of motion, and functional ability resulting in improved performance and form.  HOME EXERCISE PROGRAM:  Access Code: [email or web address] URL: [email or web address] Date: 11/13/2021 Prepared by: Rosita Kea  Exercises - Sit to Stand  - 1 x weekly - 2-3 sets - 5-10 reps - Seated Isometric Hip Adduction with Ball  - 1 x daily - 2-3 sets - 5-10 reps - 5 seconds hold - Seated Toe Raise  - 1 x daily - 2-3 sets - 10 reps - Seated Single Arm Shoulder Row with Anchored Resistance  - 1  x daily - 3 sets - 10 reps  PATIENT EDUCATION:  Education details: Exercise purpose/form. Self management techniques. HEP Person educated: Patient Education method: Explanation, Demonstration, Tactile cues, and Verbal cues Education comprehension: verbalized understanding, returned demonstration, verbal cues required, tactile cues required, and needs further education   ASSESSMENT:  CLINICAL IMPRESSION: Patient tolerated treatment well and had no thigh pain by end of session and back pain was no worse. Patient continues to report burning fatigue at quads with sit <> stand and has difficulty completing each set due to fatigue. Continued working on standing balance and patient was able to complete activities that rely more on single leg stance and changing directions while walking. Patient continues to have great difficulty flexing hips, has significant LE weakness and incoordination, and is limited by low back pain and spasms. Patient would benefit from continued management of limiting condition by skilled physical therapist to address remaining impairments and functional limitations to work towards stated goals and return to PLOF or maximal functional independence.   Patient is a 41 y.o. 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   Patient presents with significant pain, ROM, joint stiffness, sensation, balance, gait, muscle performance (strength, power, endurance), activity tolerance impairments that are limiting ability to complete his usual activities and basic mobility such as  bed mobility, transfers, driving, household and community ambulation, preventing falls, floor transfers, playing with his daughter, stairs, social participation and socializing with friends without difficulty. This condition decreases patients quality of life and increases his risk of injury from falls. Patient will benefit from skilled physical therapy intervention to address current body structure impairments and activity limitations to improve function and work towards goals set in current POC in order to return to prior level of function or maximal functional improvement.    OBJECTIVE IMPAIRMENTS Abnormal gait, decreased activity tolerance, decreased balance, decreased coordination, decreased endurance, decreased knowledge of use of DME, decreased mobility, difficulty walking, decreased ROM, decreased strength, impaired perceived functional ability, impaired flexibility, impaired sensation, impaired tone, impaired UE functional use, postural dysfunction, obesity, and pain.   ACTIVITY LIMITATIONS cleaning, community activity, driving, occupation, laundry, shopping, and basic mobility such as bed mobility, transfers, driving, household and community ambulation, preventing falls, floor transfers, playing with his daughter, stairs, social participation and socializing with friends .   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    REHAB POTENTIAL: Good  CLINICAL DECISION MAKING: Evolving/moderate  complexity  EVALUATION COMPLEXITY: Moderate   GOALS: Goals reviewed with patient? No  SHORT TERM GOALS: Target date: 11/15/2021  Be independent with initial home exercise program for self-management of symptoms. Baseline: initial HEP to be provided at visit 2 as appropriate (11/01/2021);  Goal status: MET  LONG TERM GOALS: Target date: 01/24/2022  Be independent with a long-term home exercise program for self-management of symptoms.  Baseline: Initial HEP to be provided at visit 2 as appropriate (11/01/2021); patient currently participating well (12/06/2021);  Goal status: IN PROGRESS  2.  Demonstrate improved FOTO to equal or greater than 42 by visit #14 to demonstrate improvement in overall condition and self-reported functional ability.  Baseline: 26 (11/01/2021); 45 at visit #10 (12/06/2021);  Goal status: ACHIEVED 12/06/2021  3.  Patient will complete 5 Time Sit To Stand Test in equal or less than 15 seconds for 18.5 inch surface with no UE support to demonstrate improved ability to complete transfers and decrease fall risk.  Baseline: 45 second from 18.5 inch plinth with R UE support on plinth and L UE support on locked rollator and B UE support on rollator in standing. Multiple attempts needed on some reps (11/01/2021); 21 second from 18.5 inch plinth with B UE support on plinth. Walker positioned in front of him for safety but did not need it. (12/06/2021); Marland Kitchen  Goal status: IN PROGRESS  4.  Patient will ambulate equal or greater than 1200 feet on 6 Minute Walk Test with [facility]  to demonstrate improved household and community mobility as well as community and social participation.  Baseline: to be tested visit 2 as appropriate (11/01/2021); 800 feet with rollator. Low back pain increased to 4-5/10 and had pain down R LE to calf (11/13/2021); 943 feet with SPC and SBA (two little standing breaks to stretch back due to tightness at low back, increased sharper pain in back and B legs, 12/06/2021);  948  feet with SPC (01/09/2022);  Goal status: IN PROGRESS  5.  Complete community, work and/or recreational activities without limitation due to current condition.  Baseline: difficulty with bed mobility, transfers, driving, household and community ambulation, preventing falls, floor transfers, playing with his daughter, stairs, social participation and socializing with friends (11/01/2021); walking is getting easier, transfers are easier, doesn't have to use his rollator as much, stairs are still troublesome, has not tried floor transfer, still restricted socially and continues to have significant limitations with functional activities and mobility (12/06/2021);  Goal status: IN PROGRESS     PLAN: PT FREQUENCY: 2x/week  PT DURATION: 12 weeks  PLANNED INTERVENTIONS: Therapeutic exercises, Therapeutic activity, Neuromuscular re-education, Balance training, Gait training, Patient/Family education, Joint mobilization, Stair training, DME instructions, Dry Needling, and Electrical stimulation.  PLAN FOR NEXT SESSION: Plan to continue with similar strengthening progression and consider starting step up as tolerated next session to help patient be able to go to the movie theater and use a seat in higher in the theater.   Everlean Alstrom. Graylon Good, PT, DPT 01/11/22, 7:20 PM  [place] [address], [place] [address] P: [phone] I F: [phone]      OUTPATIENT PHYSICAL THERAPY TREATMENT NOTE  Patient Name: Travis Fitzpatrick MRN: [id number] DOB:[birth year], 41 y.o., male Today's Date: 01/11/2022   PT End of Session - 01/11/22 1919     Visit Number 19    Number of Visits 24    Date for PT Re-Evaluation 01/24/22    Authorization Type MEDICARE PART B reporting period from 11/01/2021    Progress Note Due on Visit 10    PT Start Time 1823    PT Stop Time 1906    PT Time Calculation (min) 43 min    Equipment Utilized During Treatment Gait belt    Activity  Tolerance Patient tolerated treatment well;No increased pain    Behavior During Therapy [facility] for tasks assessed/performed                  Past Medical History:  Diagnosis Date   Bowel trouble    urgency   Medical history non-contributory    Urinary urgency    Past Surgical History:  Procedure Laterality Date   BACK SURGERY  2010   CIRCUMCISION     LUMBAR LAMINECTOMY/DECOMPRESSION MICRODISCECTOMY  07/20/2011   Procedure: LUMBAR LAMINECTOMY/DECOMPRESSION MICRODISCECTOMY;  Surgeon: Winfield Cunas;  Location: [place] [facility];  Service: Neurosurgery;  Laterality: N/A;  right thoracotomy with thoracic eight-nine discectomy and fusion   LUMBAR LAMINECTOMY/DECOMPRESSION MICRODISCECTOMY Right 10/06/2021   Procedure: Right Lumbar Four-Five Microdiscectomy, Right Lumbar Five-Sacal One Foraminotomy;  Surgeon: Ashok Pall, MD;  Location: [place];  Service: Neurosurgery;  Laterality: Right;  3C/RM 21   THORACIC DISCECTOMY  07/16/2012   Procedure: THORACIC DISCECTOMY;  Surgeon: Winfield Cunas, MD;  Location: [place] NEUR[facility] ORS;  Service: Neurosurgery;  Laterality: Right;  RIGHT Thoracic seven-eight  thoracic diskectomy via thoracotomy by dr Cyndia Bent   THORACIC DISCECTOMY N/A 12/15/2014   Procedure: THORACIC SEVEN TO THORACIC NINE Laminectomy ;  Surgeon: Ashok Pall, MD;  Location: [place] NEUR[facility] ORS;  Service: Neurosurgery;  Laterality: N/A;   THORACIC DISCECTOMY N/A 06/06/2017   Procedure: LAMINECTOMY THORACIC NINE-TEN;  Surgeon: Ashok Pall, MD;  Location: [place];  Service: Neurosurgery;  Laterality: N/A;  LAMINECTOMY THORACIC NINE-TEN   THORACOTOMY  07/20/2011   Procedure: THORACOTOMY OPEN FOR SPINE SURGERY;  Surgeon: Pierre Bali, MD;  Location: [facility];  Service: Vascular;  Laterality: N/A;   THORACOTOMY  07/16/2012   Procedure: THORACOTOMY OPEN FOR SPINE SURGERY;  Surgeon: Gaye Pollack, MD;  Location: [place] NE[facility];  Service: Thoracic;  Laterality: N/A;   Patient Active Problem List   Diagnosis  Date Noted   HNP (herniated nucleus pulposus), lumbar 10/06/2021   Hand pain 06/28/2021   Lumbar facet arthropathy 01/11/2021   Thoracic spondylosis with myelopathy 01/11/2021   Thoracic spinal stenosis 06/06/2017   Stenosis, spinal, thoracic 12/15/2014   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 09/22/2014   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/2011    [facility]P: [facility] [place]  REFERRING PROVIDER: Ashok Pall, MD  REFERRING DIAG: 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  PERTINENT HISTORY: Patient is a 41 y.o. 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 This patient's chief complaints consist of low back pain, R LE pain/paresthesia/weakness, L LE weakness s/p right L4-5 laminotomy, microdiscectomy on 10/06/2021 leading to the following functional deficits: difficulty with bed mobility, transfers, driving, household and community ambulation, preventing falls, floor transfers, playing with his daughter, stairs, social participation and socializing with friends. 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    Patient denies hx of cancer, stroke, seizures, lung problems, heart problems, diabetes, unexplained weight loss, and osteoporosis. Patient reports he has difficulty with making it to the bathroom on time when he gets bowel or bladder urges and has stumbling. He has history of thoracic and lumbar surgeries.  PRECAUTIONS: Other: no lifting > 10 lbs, no bending/twisting at waist (at least until 01/13/2022). Don't lay on stomach.    PATIENT GOALS: get better and get off the walker to walk with [facility], play with his daughter (39month), get his strength back, be able to get up and down from floor.  SUBJECTIVE: Patient reports he is having a pretty good day. He did not do much this weekend. They had a cookout and he did not stand long. He felt okay after last PT session.  PAIN:  Are you having pain? 2/10 Location: surgical site at low back and he cannot tell he has any pain in his legs.  OBJECTIVE  FUNCTIONAL/BALANCE TESTS 6MWT: 948 feet with SPC  TODAY'S TREATMENT  Therapeutic exercise: to centralize symptoms and improve ROM, strength, muscular endurance, and activity tolerance required for successful completion of functional activities.  - ambulation around the clinic for distance in 6 minutes using SPC and SBA. One breif rest but covered 948 feet.  - sit <> stand from 20.5 inch plinth with no UE support, rollator placed in front of body for safety, 3x10 (with other exercises in between sets). Need several tries for some reps.  - seated ball to knee drops with small march while dropping small green ball knee to bounce it off, 1x5 each side for LE coordination and speed of movement.  - seated reactive kicks with soccer ball on floor and small green ball in air, kicking ball back and fort to/from PT for improved LE coordination and speed.   Neuromuscular Re-education: to improve, balance, postural strength, muscle activation patterns,  and stabilization strength required for functional activities:  - standing static balance on airex pad with self selected stance and eyes closed. SBA and intermittent UE support for safety. 3x1 min.   BIODEX LIMITS OF STABILITY EXERCISE  Feet positioned with heels at B4 and B16 toes at 40 degrees.   Easy difficulty: Trial 1: 1:06 min with 39% (lost balance once) Trial 2: 0:44 min  with 55% Medium difficulty: Trial 3: 1:02 min with 52% (lost balance once)  Pt required multimodal cuing for proper technique and to facilitate improved neuromuscular control, strength, range of motion, and functional ability resulting in improved performance and form.  HOME EXERCISE PROGRAM:  Access Code: [email or web address] URL: [email or web address] Date: 11/13/2021 Prepared by: SRosita Kea Exercises - Sit to Stand  - 1 x weekly - 2-3 sets - 5-10 reps - Seated Isometric Hip Adduction with BDiona Foley -  1 x daily - 2-3 sets - 5-10 reps - 5 seconds hold - Seated Toe Raise  - 1 x daily - 2-3 sets - 10 reps - Seated Single Arm Shoulder Row with Anchored Resistance  - 1 x daily - 3 sets - 10 reps  PATIENT EDUCATION:  Education details: Exercise purpose/form. Self management techniques. HEP Person educated: Patient Education method: Explanation, Demonstration, Tactile cues, and Verbal cues Education comprehension: verbalized understanding, returned demonstration, verbal cues required, tactile cues required, and needs further education   ASSESSMENT:  CLINICAL IMPRESSION: Patient tolerated treatment well with no increase in pain by end of session. Patient walked farther than he has before with less rests and pain using SPC during 6MWT. He was also able to progress to sit <> stand from lower surface and successfully coordinated his knees and lower legs with kicking and kneeing a light ball. Patient continues to have profound weakness and limitations in motor control and power that limits his functional mobility.  Plan to continue working on LE/core/functional strength, power, coordination, and balance as tolerated next session. Patient would benefit from continued management of limiting condition by skilled physical therapist to address remaining impairments and functional limitations to work towards stated goals and return to PLOF or maximal functional independence.   Patient is a 41 y.o. 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   Patient presents with significant pain, ROM, joint stiffness, sensation, balance, gait, muscle performance (strength, power, endurance), activity tolerance impairments that are limiting ability to complete his usual activities and basic mobility such as bed mobility, transfers, driving, household and community ambulation, preventing falls, floor transfers, playing with his daughter, stairs, social participation and socializing with friends without difficulty. This condition decreases patients quality of life and increases his risk of injury from falls. Patient will benefit from skilled physical therapy intervention to address current body structure impairments and activity limitations to improve function and work towards goals set in current POC in order to return to prior level of function or maximal functional improvement.    OBJECTIVE IMPAIRMENTS Abnormal gait, decreased activity tolerance, decreased balance, decreased coordination, decreased endurance, decreased knowledge of use of DME, decreased mobility, difficulty walking, decreased ROM, decreased strength,  impaired perceived functional ability, impaired flexibility, impaired sensation, impaired tone, impaired UE functional use, postural dysfunction, obesity, and pain.   ACTIVITY LIMITATIONS cleaning, community activity, driving, occupation, laundry, shopping, and basic mobility such as bed mobility, transfers, driving, household and community ambulation, preventing falls, floor transfers, playing with his daughter, stairs, social participation and socializing with friends .   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    REHAB POTENTIAL: Good  CLINICAL DECISION MAKING: Evolving/moderate complexity  EVALUATION COMPLEXITY: Moderate   GOALS:  Goals reviewed with patient? No  SHORT TERM GOALS: Target date: 11/15/2021  Be independent with initial home exercise program for self-management of symptoms. Baseline: initial HEP to be provided at visit 2 as appropriate (11/01/2021);  Goal status: MET  LONG TERM GOALS: Target date: 01/24/2022  Be independent with a long-term home exercise program for self-management of symptoms.  Baseline: Initial HEP to be provided at visit 2 as appropriate (11/01/2021); patient currently participating well (12/06/2021);  Goal status: IN PROGRESS  2.  Demonstrate improved FOTO to equal or greater than 42 by visit #14 to demonstrate improvement in overall condition and self-reported functional ability.  Baseline: 26 (11/01/2021); 45 at visit #10 (12/06/2021);  Goal status:  ACHIEVED 12/06/2021  3.  Patient will complete 5 Time Sit To Stand Test in equal or less than 15 seconds for 18.5 inch surface with no UE support to demonstrate improved ability to complete transfers and decrease fall risk.  Baseline: 45 second from 18.5 inch plinth with R UE support on plinth and L UE support on locked rollator and B UE support on rollator in standing. Multiple attempts needed on some reps (11/01/2021); 21 second from 18.5 inch plinth with B UE support on plinth. Walker positioned in front of him for safety but did not need it. (12/06/2021); Marland Kitchen  Goal status: IN PROGRESS  4.  Patient will ambulate equal or greater than 1200 feet on 6 Minute Walk Test with [facility]  to demonstrate improved household and community mobility as well as community and social participation.  Baseline: to be tested visit 2 as appropriate (11/01/2021); 800 feet with rollator. Low back pain increased to 4-5/10 and had pain down R LE to calf (11/13/2021); 943 feet with SPC and SBA (two little standing breaks to stretch back due to tightness at low back, increased sharper pain in back and B legs, 12/06/2021);  948 feet with SPC (01/09/2022);  Goal status: IN PROGRESS  5.  Complete community, work and/or recreational activities without limitation due to current condition.  Baseline: difficulty with bed mobility, transfers, driving, household and community ambulation, preventing falls, floor transfers, playing with his daughter, stairs, social participation and socializing with friends (11/01/2021); walking is getting easier, transfers are easier, doesn't have to use his rollator as much, stairs are still troublesome, has not tried floor transfer, still restricted socially and continues to have significant limitations with functional activities and mobility (12/06/2021);  Goal status: IN PROGRESS     PLAN: PT FREQUENCY: 2x/week  PT DURATION: 12 weeks  PLANNED INTERVENTIONS: Therapeutic exercises, Therapeutic activity,  Neuromuscular re-education, Balance training, Gait training, Patient/Family education, Joint mobilization, Stair training, DME instructions, Dry Needling, and Electrical stimulation.  PLAN FOR NEXT SESSION: Plan to continue with similar strengthening progression and consider starting step up as tolerated next session to help patient be able to go to the movie theater and use a seat in higher in the theater.   Everlean Alstrom. Graylon Good, PT, DPT 01/11/22, 7:20 PM  [facility] [address] [place], [place] [address] P: [phone] I F: [phone]

## 2022-01-15 ENCOUNTER — Ambulatory Visit: Payer: Medicare Other | Admitting: Physical Therapy

## 2022-01-15 ENCOUNTER — Encounter: Payer: Medicare Other | Admitting: Physical Therapy

## 2022-01-15 ENCOUNTER — Encounter: Payer: Self-pay | Admitting: Physical Therapy

## 2022-01-15 DIAGNOSIS — M5459 Other low back pain: Secondary | ICD-10-CM | POA: Diagnosis not present

## 2022-01-15 DIAGNOSIS — R262 Difficulty in walking, not elsewhere classified: Secondary | ICD-10-CM

## 2022-01-15 DIAGNOSIS — M546 Pain in thoracic spine: Secondary | ICD-10-CM

## 2022-01-15 DIAGNOSIS — R2689 Other abnormalities of gait and mobility: Secondary | ICD-10-CM

## 2022-01-15 DIAGNOSIS — M6281 Muscle weakness (generalized): Secondary | ICD-10-CM

## 2022-01-15 DIAGNOSIS — R296 Repeated falls: Secondary | ICD-10-CM

## 2022-01-15 NOTE — Therapy (Addendum)
OUTPATIENT PHYSICAL THERAPY TREATMENT / PROGRESS NOTE / RE-CERTIFICATION Dates of reporting from 12/06/2021 to 01/15/2022   Patient Name: Travis Fitzpatrick MRN: [id number] DOB:10-Feb-1981, 41 y.o., male Today's Date: 01/15/2022   PT End of Session - 01/15/22 1841     Visit Number 20    Number of Visits 24    Date for PT Re-Evaluation 04/09/22    Authorization Type MEDICARE PART B reporting period from 12/06/2021    Progress Note Due on Visit 20    PT Start Time 1823    PT Stop Time 1903    PT Time Calculation (min) 40 min    Equipment Utilized During Treatment Gait belt    Activity Tolerance Patient tolerated treatment well;No increased pain    Behavior During Therapy [facility] for tasks assessed/performed              Past Medical History:  Diagnosis Date   Bowel trouble    urgency   Medical history non-contributory    Urinary urgency    Past Surgical History:  Procedure Laterality Date   BACK SURGERY  2010   CIRCUMCISION     LUMBAR LAMINECTOMY/DECOMPRESSION MICRODISCECTOMY  07/20/2011   Procedure: LUMBAR LAMINECTOMY/DECOMPRESSION MICRODISCECTOMY;  Surgeon: Winfield Cunas;  Location: [place] [facility];  Service: Neurosurgery;  Laterality: N/A;  right thoracotomy with thoracic eight-nine discectomy and fusion   LUMBAR LAMINECTOMY/DECOMPRESSION MICRODISCECTOMY Right 10/06/2021   Procedure: Right Lumbar Four-Five Microdiscectomy, Right Lumbar Five-Sacal One Foraminotomy;  Surgeon: Ashok Pall, MD;  Location: [place];  Service: Neurosurgery;  Laterality: Right;  3C/RM 21   THORACIC DISCECTOMY  07/16/2012   Procedure: THORACIC DISCECTOMY;  Surgeon: Winfield Cunas, MD;  Location: [place] [facility];  Service: Neurosurgery;  Laterality: Right;  RIGHT Thoracic seven-eight  thoracic diskectomy via thoracotomy by dr Cyndia Bent   THORACIC DISCECTOMY N/A 12/15/2014   Procedure: THORACIC SEVEN TO THORACIC NINE Laminectomy ;  Surgeon: Ashok Pall, MD;  Location: [place] [facility];  Service: Neurosurgery;  Laterality:  N/A;   THORACIC DISCECTOMY N/A 06/06/2017   Procedure: LAMINECTOMY THORACIC NINE-TEN;  Surgeon: Ashok Pall, MD;  Location: [place];  Service: Neurosurgery;  Laterality: N/A;  LAMINECTOMY THORACIC NINE-TEN   THORACOTOMY  07/20/2011   Procedure: THORACOTOMY OPEN FOR SPINE SURGERY;  Surgeon: Pierre Bali, MD;  Location: [facility];  Service: Vascular;  Laterality: N/A;   THORACOTOMY  07/16/2012   Procedure: THORACOTOMY OPEN FOR SPINE SURGERY;  Surgeon: Gaye Pollack, MD;  Location: [place] [facility];  Service: Thoracic;  Laterality: N/A;   Patient Active Problem List   Diagnosis Date Noted   HNP (herniated nucleus pulposus), lumbar 10/06/2021   Hand pain 06/28/2021   Lumbar facet arthropathy 01/11/2021   Thoracic spondylosis with myelopathy 01/11/2021   Thoracic spinal stenosis 06/06/2017   Stenosis, spinal, thoracic 12/15/2014   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 09/22/2014   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/2011    PCP: [facility] [place]  REFERRING PROVIDER: Ashok Pall, MD  REFERRING DIAG: 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  PERTINENT HISTORY: Patient is a  41 y.o. 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 This patient's chief complaints consist of low back pain, R LE pain/paresthesia/weakness, L LE weakness s/p right L4-5 laminotomy, microdiscectomy on  10/06/2021 leading to the following functional deficits: difficulty with bed mobility, transfers, driving, household and community ambulation, preventing falls, floor transfers, playing with his daughter, stairs, social participation and socializing with friends. 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    Patient denies hx of cancer, stroke, seizures, lung problems, heart problems, diabetes, unexplained weight loss, and osteoporosis. Patient reports he has difficulty with making it to the bathroom on time when he gets bowel or bladder urges and has stumbling. He has history of thoracic and lumbar surgeries.  PRECAUTIONS: Other: no lifting > 10 lbs, no bending/twisting at waist (at least until 01/13/2022). Don't lay on stomach.    PATIENT GOALS: get better and get off the walker to walk with [facility], play with his daughter (15 months), get his strength back, be able to get up and down from floor.  SUBJECTIVE: Patient reports he is feeling well and has pain in his low back and left posterior thigh upon arrival. He is on his rollator. He states he was sore in his lower back and his anterior thighs after last PT session until the next day. HEP is going well. Patient feels he is improving with PT and has noticed it helps him ambulate longer and with less need for assistive device (he is able to use SPC at home instead of rollator now). He also states he has improved in his ability to complete transfers. He is also able to hold his urine better than before. He is still very limited in functional mobility and usual activities such as caring for his daughter,  lifting, bending, balance, athletic activities, control of bowel and bladder.    PAIN:  Are you having pain? 2/10 Location: surgical site at low back    OBJECTIVE   SELF-REPORTED FUNCTION FOTO score: 53/100 (lumbar spine questionnaire)   FUNCTIONAL TESTS:  5 times sit to stand: 21 second from 18.5 inch plinth with no UE support on plinth. Walker positioned in front of him for safety but did not need it.    6 Minute Walk Test: 1045 feet with SPC and SBA, occasionally caught toe on floor.   TODAY'S TREATMENT  Therapeutic exercise: to centralize symptoms and improve ROM, strength, muscular endurance, and activity tolerance required for successful completion of functional activities.  - ambulation 1025 feet with SPC in R LE and SBA. - sit <> stand 1x5 for speed with B UE support from edge of 18.5 inch plinth.    Neuromuscular Re-education: to improve, balance, postural strength, muscle activation patterns, and stabilization strength required for functional activities:  - standing kicking over cone and righting it using the foot, SPC in ipsilateral UE and TM bar available on contralateral side with CGA for safety. 1x10 each side  - standing ball kicks with CGA and SPC using small green lightweight ball kicked against wall. ~ 5 min - standing ball tosses and dribbling with side stepping, CGA.   Pt required  multimodal cuing for proper technique and to facilitate improved neuromuscular control, strength, range of motion, and functional ability resulting in improved performance and form.  HOME EXERCISE PROGRAM:  Access Code: [email or web address] URL: [email or web address] Date: 11/13/2021 Prepared by: Rosita Kea  Exercises - Sit to Stand  - 1 x weekly - 2-3 sets - 5-10 reps - Seated Isometric Hip Adduction with Ball  - 1 x daily - 2-3 sets - 5-10 reps - 5 seconds hold - Seated Toe Raise  - 1 x daily - 2-3 sets - 10 reps - Seated Single Arm Shoulder Row with Anchored Resistance  - 1 x  daily - 3 sets - 10 reps  PATIENT EDUCATION:  Education details: Exercise purpose/form. Self management techniques. Progress, POC.  Person educated: Patient Education method: Explanation, Demonstration, Tactile cues, and Verbal cues Education comprehension: verbalized understanding, returned demonstration, verbal cues required, tactile cues required, and needs further education   ASSESSMENT:  CLINICAL IMPRESSION: Patient has attended 20 physical therapy sessions since starting this episode of care on 11/01/2021. His LE strength, balance, and activity tolerance has gradually improving, although he has been limited by setbacks of increased leg pain and weakness. He demonstrates improved 5TSTS test and 6MWT but has not yet reached his goals. He continues to have significant LE weakness, incoordination, and imbalance. He is especially weak in bilateral hip flexion. He continues to have difficulty with bowel and bladder control and is unable to ambulate safely without an assistive device. Plan to continue PT to keep working on regaining LE strength, core strength, functional activity tolerance, and balance for improved quality of life and return to prior level of function. Patient would benefit from continued management of limiting condition by skilled physical therapist to address remaining impairments and functional limitations to work towards stated goals and return to PLOF or maximal functional independence.   Patient is a 41 y.o. 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   Patient  presents with significant pain, ROM, joint stiffness, sensation, balance, gait, muscle performance (strength, power, endurance), activity tolerance impairments that are limiting ability to complete his usual activities and basic mobility such as bed mobility, transfers, driving, household and community ambulation, preventing falls, floor transfers, playing with his daughter, stairs, social participation and socializing with friends without difficulty. This condition decreases patients quality of life and increases his risk of injury from falls. Patient will benefit from skilled physical therapy intervention to address current body structure impairments and activity limitations to improve function and work towards goals set in current POC in order to return to prior level of function or maximal functional improvement.    OBJECTIVE IMPAIRMENTS Abnormal gait, decreased activity tolerance, decreased balance, decreased coordination, decreased endurance, decreased knowledge of use of DME, decreased mobility, difficulty walking, decreased ROM, decreased strength, impaired perceived functional ability, impaired flexibility, impaired sensation, impaired tone, impaired UE functional use, postural dysfunction, obesity, and pain.   ACTIVITY LIMITATIONS cleaning, community activity, driving, occupation, laundry, shopping, and basic mobility such as bed mobility, transfers, driving, household and community ambulation, preventing falls, floor transfers, playing with his daughter, stairs, social participation and socializing  with friends .   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    REHAB POTENTIAL: Good  CLINICAL DECISION MAKING: Evolving/moderate complexity  EVALUATION COMPLEXITY: Moderate   GOALS: Goals reviewed with patient? No  SHORT TERM GOALS: Target date: 11/15/2021  Be independent with initial home exercise program for self-management of symptoms. Baseline: initial HEP to be provided at visit 2 as appropriate (11/01/2021);  Goal status: MET  LONG TERM GOALS: Target date: 01/24/2022; Updated to 04/09/2022 on 01/15/2022.  Be independent with a long-term home exercise program for self-management of symptoms.  Baseline: Initial HEP to be provided at visit 2 as appropriate (11/01/2021); patient currently participating well (12/06/2021; 01/15/2022);  Goal status: IN PROGRESS  2.  Demonstrate improved FOTO to equal or greater than 42 by visit #14 to demonstrate improvement in overall condition and self-reported functional ability.  Baseline: 26 (11/01/2021); 45 at visit #10 (12/06/2021); 53 at visit# 20 (01/15/2022);  Goal status: ACHIEVED 12/06/2021  3.  Patient will complete 5 Time Sit To Stand Test in equal or less than 15 seconds for 18.5 inch surface with no UE support to demonstrate improved ability to complete transfers and decrease fall risk.  Baseline: 45 second from 18.5 inch plinth with R UE support on plinth and L UE support on locked rollator and B UE support on rollator in standing. Multiple attempts needed on some reps (11/01/2021); 21 second from 18.5 inch plinth with B UE support on plinth. Walker positioned in front of him for safety but did not need it. (12/06/2021); 21 second from 18.5 inch plinth with no UE support. Walker positioned in front of him for safety but did not need it (01/15/2022);  Goal status: IN PROGRESS  4.  Patient will ambulate equal or greater than 1200 feet on 6 Minute Walk  Test with [facility]  to demonstrate improved household and community mobility as well as community and social participation.  Baseline: to be tested visit 2 as appropriate (11/01/2021); 800 feet with rollator. Low back pain increased to 4-5/10 and had pain down R LE to calf (11/13/2021); 943 feet with SPC and SBA (two little standing breaks to stretch back due to tightness at low back, increased sharper pain in back and B legs, 12/06/2021);  948 feet with SPC (01/09/2022); 1045 feet with SPC no rests  but catches toes on floor at times (01/15/2022);  Goal status: IN PROGRESS  5.  Complete community, work and/or recreational activities without limitation due to current condition.  Baseline: difficulty with bed mobility, transfers, driving, household and community ambulation, preventing falls, floor transfers, playing with his daughter, stairs, social participation and socializing with friends (11/01/2021); walking is getting easier, transfers are easier, doesn't have to use his rollator as much, stairs are still troublesome, has not tried floor transfer, still restricted socially and continues to have significant limitations with functional activities and mobility (12/06/2021); walking and transfers are easier and is using SPC at home instead of rollator, continues to be restricted socially and have significant limitations with functional activities, basic mobility, and balance.  Goal status: IN PROGRESS  PLAN: PT FREQUENCY: 2x/week  PT DURATION: 12 weeks  PLANNED INTERVENTIONS: Therapeutic exercises, Therapeutic activity, Neuromuscular re-education, Balance training, Gait training, Patient/Family education, Joint mobilization, Stair training, DME instructions, Dry Needling, and Electrical stimulation.  PLAN FOR NEXT SESSION: Plan to continue with similar strengthening progression and consider starting step up as tolerated next session to help patient be able to go to the movie theater and use a seat in higher  in the theater.   Everlean Alstrom. Graylon Good, PT, DPT 01/16/22, 12:03 PM  [facility] [address] [place], [place] [address] P: [phone] I F: [phone]

## 2022-01-16 NOTE — Therapy (Addendum)
OUTPATIENT PHYSICAL THERAPY TREATMENT NOTE   Patient Name: Travis Fitzpatrick MRN: [id number] DOB:1980-09-30, 41 y.o., male Today's Date: 01/17/22    PT End of Session - 01/18/22 1627     Visit Number 21    Number of Visits 24    Date for PT Re-Evaluation 04/09/22    Authorization Type MEDICARE PART B reporting period from 01/15/2022    Progress Note Due on Visit 20    PT Start Time 1820    PT Stop Time 1900    PT Time Calculation (min) 40 min    Equipment Utilized During Treatment Gait belt    Activity Tolerance Patient tolerated treatment well;No increased pain    Behavior During Therapy [facility] for tasks assessed/performed               Past Medical History:  Diagnosis Date   Bowel trouble    urgency   Medical history non-contributory    Urinary urgency    Past Surgical History:  Procedure Laterality Date   BACK SURGERY  2010   CIRCUMCISION     LUMBAR LAMINECTOMY/DECOMPRESSION MICRODISCECTOMY  07/20/2011   Procedure: LUMBAR LAMINECTOMY/DECOMPRESSION MICRODISCECTOMY;  Surgeon: Winfield Cunas;  Location: [place] [facility];  Service: Neurosurgery;  Laterality: N/A;  right thoracotomy with thoracic eight-nine discectomy and fusion   LUMBAR LAMINECTOMY/DECOMPRESSION MICRODISCECTOMY Right 10/06/2021   Procedure: Right Lumbar Four-Five Microdiscectomy, Right Lumbar Five-Sacal One Foraminotomy;  Surgeon: Ashok Pall, MD;  Location: [place];  Service: Neurosurgery;  Laterality: Right;  3C/RM 21   THORACIC DISCECTOMY  07/16/2012   Procedure: THORACIC DISCECTOMY;  Surgeon: Winfield Cunas, MD;  Location: [place] [facility];  Service: Neurosurgery;  Laterality: Right;  RIGHT Thoracic seven-eight  thoracic diskectomy via thoracotomy by dr Cyndia Bent   THORACIC DISCECTOMY N/A 12/15/2014   Procedure: THORACIC SEVEN TO THORACIC NINE Laminectomy ;  Surgeon: Ashok Pall, MD;  Location: [place] [facility];  Service: Neurosurgery;  Laterality: N/A;   THORACIC DISCECTOMY N/A 06/06/2017   Procedure: LAMINECTOMY  THORACIC NINE-TEN;  Surgeon: Ashok Pall, MD;  Location: [place];  Service: Neurosurgery;  Laterality: N/A;  LAMINECTOMY THORACIC NINE-TEN   THORACOTOMY  07/20/2011   Procedure: THORACOTOMY OPEN FOR SPINE SURGERY;  Surgeon: Pierre Bali, MD;  Location: [facility];  Service: Vascular;  Laterality: N/A;   THORACOTOMY  07/16/2012   Procedure: THORACOTOMY OPEN FOR SPINE SURGERY;  Surgeon: Gaye Pollack, MD;  Location: [place] [facility];  Service: Thoracic;  Laterality: N/A;   Patient Active Problem List   Diagnosis Date Noted   HNP (herniated nucleus pulposus), lumbar 10/06/2021   Hand pain 06/28/2021   Lumbar facet arthropathy 01/11/2021   Thoracic spondylosis with myelopathy 01/11/2021   Thoracic spinal stenosis 06/06/2017   Stenosis, spinal, thoracic 12/15/2014   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 09/22/2014   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/2011    PCP: [facility] [place]  REFERRING PROVIDER: Ashok Pall, MD  REFERRING DIAG: 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  PERTINENT HISTORY: Patient is a 41 y.o. male who presents to outpatient physical therapy  with a referral for medical diagnosis  intervertebral disc disorder of thoracic region with myelopathy. This patient's chief complaints consist of low back pain, R LE pain/paresthesia/weakness, L LE weakness s/p right L4-5 laminotomy, microdiscectomy on 10/06/2021 leading to the following functional deficits: difficulty with bed  mobility, transfers, driving, household and community ambulation, preventing falls, floor transfers, playing with his daughter, stairs, social participation and socializing with friends. 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    Patient denies hx of cancer, stroke, seizures, lung problems, heart problems, diabetes, unexplained weight loss, and osteoporosis. Patient reports he has difficulty with making it to the bathroom on time when he gets bowel or bladder urges and has stumbling. He has history of thoracic and lumbar surgeries.  PRECAUTIONS: Other: no lifting > 10 lbs, no bending/twisting at waist (at least until 01/13/2022). Don't lay on stomach.    PATIENT GOALS: get better and get off the walker to walk with [facility], play with his daughter (15 months), get his strength back, be able to get up and down from floor.  SUBJECTIVE: Patient arrives on his rollator and reports he is feeling well. He states he has the same pain of about 2/10 in his low back and posterior left thigh.     PAIN:  Are you having pain? 2/10 Location: surgical site at low back and left posterior thigh   OBJECTIVE   TODAY'S TREATMENT  Therapeutic exercise: to centralize symptoms and improve ROM, strength, muscular endurance, and activity tolerance required for successful completion of functional activities.  - ambulation 975 feet while holding SPC off the ground for most of the way (break for bathroom about halfway through).  - seated lat pull, 3x10 with 25# cable - seated row with lower trunk braced at [place], 3x10 with 15#  Neuromuscular Re-education: to  improve, balance, postural strength, muscle activation patterns, and stabilization strength required for functional activities:  - standing ball kicks with CGA and SPC using small green lightweight ball kicked against wall. ~ 5 min - stepping laterally over 6 inch hurdle, 2x10 each side, UE support as needed, CGA.  - stepping forwards and backwards over 6 inch hurdle, 1x10 with each foot leading. Able to step forwards with no UE support but needs U UE support to step backwards safely. CGA.   Pt required multimodal cuing for proper technique and to facilitate improved neuromuscular control, strength, range of motion, and functional ability resulting in improved performance and form.  HOME EXERCISE PROGRAM:  Access Code: [email or web address] URL: [email or web address] Date: 11/13/2021 Prepared by: Rosita Kea  Exercises - Sit to Stand  - 1 x weekly - 2-3 sets - 5-10 reps - Seated Isometric Hip Adduction with Ball  - 1 x daily - 2-3 sets - 5-10 reps - 5 seconds hold - Seated Toe Raise  - 1 x daily - 2-3 sets - 10 reps - Seated Single Arm Shoulder Row with Anchored Resistance  - 1 x daily - 3 sets - 10 reps  PATIENT EDUCATION:  Education details: Exercise purpose/form. Self management techniques. Progress, POC.  Person educated: Patient Education method: Explanation, Demonstration, Tactile cues, and Verbal cues Education comprehension: verbalized understanding, returned demonstration, verbal cues required, tactile cues  required, and needs further education   ASSESSMENT:  CLINICAL IMPRESSION: Patient tolerated treatment well with no increase in back pain. He tolerated more time standing this session and was able to start stepping over 6 inch hurdle exercise. Plan to continue with balance, core, LE, and functional strength exercises next session as appropriate. Patient would benefit from continued management of limiting condition by skilled physical therapist to address remaining impairments  and functional limitations to work towards stated goals and return to PLOF or maximal functional independence.    Patient is a 41 y.o. 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   Patient presents with significant pain, ROM, joint stiffness, sensation, balance, gait, muscle performance (strength, power, endurance), activity tolerance impairments that are limiting ability to complete his usual activities and basic mobility such as bed mobility, transfers, driving, household and community ambulation, preventing falls, floor transfers, playing with his daughter, stairs, social participation and socializing with friends without difficulty. This condition decreases patients quality of life and increases his risk of injury from falls. Patient will benefit from skilled physical therapy intervention to address current body structure impairments and activity limitations to improve function and work towards goals set in current POC in order to return to prior level of function or maximal functional improvement.    OBJECTIVE IMPAIRMENTS Abnormal gait, decreased activity tolerance, decreased balance, decreased coordination, decreased endurance, decreased knowledge of use of DME, decreased mobility, difficulty walking, decreased ROM, decreased strength, impaired perceived functional ability, impaired flexibility, impaired sensation, impaired tone, impaired UE functional use, postural dysfunction, obesity, and pain.   ACTIVITY LIMITATIONS cleaning, community activity, driving, occupation, laundry,  shopping, and basic mobility such as bed mobility, transfers, driving, household and community ambulation, preventing falls, floor transfers, playing with his daughter, stairs, social participation and socializing with friends .   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    REHAB POTENTIAL: Good  CLINICAL DECISION MAKING: Evolving/moderate complexity  EVALUATION COMPLEXITY: Moderate   GOALS: Goals reviewed with patient? No  SHORT TERM GOALS: Target date: 11/15/2021  Be independent with initial home exercise program for self-management of symptoms. Baseline: initial HEP to be provided at visit 2 as appropriate (11/01/2021);  Goal status: MET  LONG TERM GOALS: Target date: 01/24/2022; Updated to 04/09/2022 on 01/15/2022.  Be independent with a long-term home exercise program for self-management of symptoms.  Baseline: Initial HEP to be provided at visit 2 as appropriate (11/01/2021); patient currently participating well (12/06/2021; 01/15/2022);  Goal status: IN PROGRESS  2.  Demonstrate improved FOTO to equal or greater than 42 by visit #14 to demonstrate improvement in overall condition and self-reported functional ability.  Baseline: 26 (11/01/2021); 45 at visit #10 (12/06/2021); 53 at visit# 20 (01/15/2022);  Goal status: ACHIEVED 12/06/2021  3.  Patient will complete 5 Time Sit To Stand Test in equal or less than 15 seconds for 18.5 inch surface with no UE support to demonstrate improved ability  to complete transfers and decrease fall risk.  Baseline: 45 second from 18.5 inch plinth with R UE support on plinth and L UE support on locked rollator and B UE support on rollator in standing. Multiple attempts needed on some reps (11/01/2021); 21 second from 18.5 inch plinth with B UE support on plinth. Walker positioned in front of him for safety but did not need it. (12/06/2021); 21 second from 18.5 inch plinth with no UE support. Walker positioned in front of him for safety but did not need it (01/15/2022);  Goal status: IN PROGRESS  4.  Patient will ambulate equal or greater than 1200 feet on 6 Minute Walk Test with [facility]  to demonstrate improved household and community mobility as well as community and social participation.  Baseline: to be tested visit 2 as appropriate (11/01/2021); 800 feet with rollator. Low back pain increased to 4-5/10 and had pain down R LE to calf (11/13/2021); 943 feet with SPC and SBA (two little standing breaks to stretch back due to tightness at low back, increased sharper pain in back and B legs, 12/06/2021);  948 feet with SPC (01/09/2022); 1045 feet with SPC no rests  but catches toes on floor at times (01/15/2022);  Goal status: IN PROGRESS  5.  Complete community, work and/or recreational activities without limitation due to current condition.  Baseline: difficulty with bed mobility, transfers, driving, household and community ambulation, preventing falls, floor transfers, playing with his daughter, stairs, social participation and socializing with friends (11/01/2021); walking is getting easier, transfers are easier, doesn't have to use his rollator as much, stairs are still troublesome, has not tried floor transfer, still restricted socially and continues to have significant limitations with functional activities and mobility (12/06/2021); walking and transfers are easier and is using SPC at home instead of rollator, continues to be restricted socially and have significant  limitations with functional activities, basic mobility, and balance.  Goal status: IN PROGRESS     PLAN: PT FREQUENCY: 2x/week  PT DURATION: 12 weeks  PLANNED INTERVENTIONS: Therapeutic exercises, Therapeutic activity, Neuromuscular re-education, Balance training, Gait training, Patient/Family education, Joint mobilization, Stair training, DME instructions, Dry Needling, and Electrical stimulation.  PLAN FOR NEXT SESSION: Plan to continue with similar strengthening progression and consider starting step up as tolerated next session to help patient be able to go to the movie theater and use a seat in higher in the theater.   Everlean Alstrom. Graylon Good, PT, DPT 01/18/22, 7:25 PM  [facility] [address] [place], [place] [address] P: [phone] I F: [phone]

## 2022-01-17 ENCOUNTER — Ambulatory Visit: Payer: Medicare Other | Admitting: Physical Therapy

## 2022-01-17 DIAGNOSIS — M5459 Other low back pain: Secondary | ICD-10-CM | POA: Diagnosis not present

## 2022-01-17 DIAGNOSIS — M546 Pain in thoracic spine: Secondary | ICD-10-CM

## 2022-01-17 DIAGNOSIS — R262 Difficulty in walking, not elsewhere classified: Secondary | ICD-10-CM

## 2022-01-17 DIAGNOSIS — R2689 Other abnormalities of gait and mobility: Secondary | ICD-10-CM

## 2022-01-17 DIAGNOSIS — R296 Repeated falls: Secondary | ICD-10-CM

## 2022-01-17 DIAGNOSIS — M6281 Muscle weakness (generalized): Secondary | ICD-10-CM

## 2022-01-18 ENCOUNTER — Encounter: Payer: Self-pay | Admitting: Physical Therapy

## 2022-01-18 NOTE — Therapy (Signed)
OUTPATIENT PHYSICAL THERAPY TREATMENT NOTE   Patient Name: Travis Fitzpatrick MRN: [id number] DOB:[birth year], 41 y.o., male Today's Date: 01/22/22     PT End of Session - 01/22/22 1830     Visit Number 22    Number of Visits 24    Date for PT Re-Evaluation 04/09/22    Authorization Type MEDICARE PART B reporting period from 01/15/2022    Progress Note Due on Visit 20    PT Start Time 1820    PT Stop Time 1900    PT Time Calculation (min) 40 min    Equipment Utilized During Treatment Gait belt    Activity Tolerance Patient tolerated treatment well    Behavior During Therapy WFL for tasks assessed/performed                Past Medical History:  Diagnosis Date   Bowel trouble    urgency   Medical history non-contributory    Urinary urgency    Past Surgical History:  Procedure Laterality Date   BACK SURGERY  2010   CIRCUMCISION     LUMBAR LAMINECTOMY/DECOMPRESSION MICRODISCECTOMY  07/20/2011   Procedure: LUMBAR LAMINECTOMY/DECOMPRESSION MICRODISCECTOMY;  Surgeon: Winfield Cunas;  Location: [place] [facility];  Service: Neurosurgery;  Laterality: N/A;  right thoracotomy with thoracic eight-nine discectomy and fusion   LUMBAR LAMINECTOMY/DECOMPRESSION MICRODISCECTOMY Right 10/06/2021   Procedure: Right Lumbar Four-Five Microdiscectomy, Right Lumbar Five-Sacal One Foraminotomy;  Surgeon: Ashok Pall, MD;  Location: [facility];  Service: Neurosurgery;  Laterality: Right;  3C/RM 21   THORACIC DISCECTOMY  07/16/2012   Procedure: THORACIC DISCECTOMY;  Surgeon: Winfield Cunas, MD;  Location: [place] [facility];  Service: Neurosurgery;  Laterality: Right;  RIGHT Thoracic seven-eight  thoracic diskectomy via thoracotomy by dr Cyndia Bent   THORACIC DISCECTOMY N/A 12/15/2014   Procedure: THORACIC SEVEN TO THORACIC NINE Laminectomy ;  Surgeon: Ashok Pall, MD;  Location: [place] [facility];  Service: Neurosurgery;  Laterality: N/A;   THORACIC DISCECTOMY N/A 06/06/2017   Procedure: LAMINECTOMY THORACIC NINE-TEN;   Surgeon: Ashok Pall, MD;  Location: [place];  Service: Neurosurgery;  Laterality: N/A;  LAMINECTOMY THORACIC NINE-TEN   THORACOTOMY  07/20/2011   Procedure: THORACOTOMY OPEN FOR SPINE SURGERY;  Surgeon: Pierre Bali, MD;  Location: [facility];  Service: Vascular;  Laterality: N/A;   THORACOTOMY  07/16/2012   Procedure: THORACOTOMY OPEN FOR SPINE SURGERY;  Surgeon: Gaye Pollack, MD;  Location: [place] [facility];  Service: Thoracic;  Laterality: N/A;   Patient Active Problem List   Diagnosis Date Noted   HNP (herniated nucleus pulposus), lumbar 10/06/2021   Hand pain 06/28/2021   Lumbar facet arthropathy 01/11/2021   Thoracic spondylosis with myelopathy 01/11/2021   Thoracic spinal stenosis 06/06/2017   Stenosis, spinal, thoracic 12/15/2014   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 09/22/2014   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/2011    PCP: [facility] [place]  REFERRING PROVIDER: Ashok Pall, MD  REFERRING DIAG: 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  PERTINENT HISTORY: Patient is a 41 y.o. male who presents to outpatient physical therapy  with a referral for medical diagnosis  intervertebral disc disorder of thoracic region with myelopathy. This patient's chief complaints consist of low back pain, R LE pain/paresthesia/weakness, L LE weakness s/p right L4-5 laminotomy, microdiscectomy on 10/06/2021 leading to the following functional deficits: difficulty with bed mobility, transfers,  driving, household and community ambulation, preventing falls, floor transfers, playing with his daughter, stairs, social participation and socializing with friends. 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    Patient denies hx of cancer, stroke, seizures, lung problems, heart problems, diabetes, unexplained weight loss, and osteoporosis. Patient reports he has difficulty with making it to the bathroom on time when he gets bowel or bladder urges and has stumbling. He has history of thoracic and lumbar surgeries.  PRECAUTIONS: Other: no lifting > 10 lbs, no bending/twisting at waist (at least until 01/13/2022). Don't lay on stomach.    PATIENT GOALS: get better and get off the walker to walk with [facility], play with his daughter (15 months), get his strength back, be able to get up and down from floor.  SUBJECTIVE: Patient reports he is doing "not good" today. He states his back and legs have been bothering him a lot and feeling weak since Saturday. He went out looking at rings and got some clothes for the baby and felt okay until he got home and laid down for a while. When he got up from resting he had increased pain and weakness in his legs. He has also been having increased trouble with his bowel and bladder and wonders if this contributed to his pain and weakness.    PAIN:  Are you having pain? 5/10 Location: surgical site at low back and bilateral posterior thighs to knees.    OBJECTIVE   TODAY'S TREATMENT  Therapeutic exercise: to centralize symptoms and improve ROM, strength, muscular endurance, and activity tolerance required for successful  completion of functional activities.  - NuStep level 6 (last min level 4) using bilateral upper and lower extremities. Seat setting 11. For improved extremity mobility, muscular endurance, and activity tolerance; and to induce the analgesic effect of aerobic exercise, stimulate improved joint nutrition, and prepare body structures and systems for following interventions. x 6  minutes.Average SPM = 71 - sit <> stand from 20.5 inch surface, 2x10 with rollator in front for balance as needed. (Increased sway on feet, 1-2 LOB backwards that needed min A to prevent descent to table).  - seated R sciatic nerve glide, slider technique, 1x11. (Discontinued due to increased anterior knee and low back pain).  - seated ankle inversion/eversion/DF while scooting towel over slick surface while keeping heel on floor, 1x3 each direction each side the length of the towel.  - seated heel raises, 1x20.  - seated chops/scoops with 2KG med ball, 1x20 each direction.   Pt required multimodal cuing for proper technique and to facilitate improved neuromuscular control, strength, range of motion, and functional ability resulting in improved performance and form.  HOME EXERCISE PROGRAM:  Access Code: [email or web address] URL: [email or web address] Date: 11/13/2021 Prepared by: Rosita Kea  Exercises - Sit to Stand  - 1 x weekly - 2-3 sets - 5-10 reps - Seated Isometric Hip Adduction with Ball  - 1 x  daily - 2-3 sets - 5-10 reps - 5 seconds hold - Seated Toe Raise  - 1 x daily - 2-3 sets - 10 reps - Seated Single Arm Shoulder Row with Anchored Resistance  - 1 x daily - 3 sets - 10 reps  PATIENT EDUCATION:  Education details: Exercise purpose/form. Self management techniques. Progress, POC.  Person educated: Patient Education method: Explanation, Demonstration, Tactile cues, and Verbal cues Education comprehension: verbalized understanding, returned demonstration, verbal cues required, tactile cues required, and  needs further education   ASSESSMENT:  CLINICAL IMPRESSION: Patient presents with elevated pain and decreased functional activity tolerance and balance this session. Exercises modified to accommodate tolerance resulting in regressed sit <> stand and core work as well as lower leg and ankle exercises. Patient plans to contact his surgeon to let him know of his ongoing difficulties. Plan to continue with PT for core, LE, and functional strength and balance as tolerated. Patient would benefit from continued management of limiting condition by skilled physical therapist to address remaining impairments and functional limitations to work towards stated goals and return to PLOF or maximal functional independence.   Patient is a 41 y.o. 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   Patient presents with significant pain, ROM, joint stiffness, sensation, balance, gait, muscle performance (strength, power, endurance), activity tolerance impairments that are limiting ability to complete his usual activities and basic mobility such as bed mobility, transfers, driving, household and community ambulation, preventing falls, floor transfers, playing with his daughter, stairs, social participation and socializing with friends without difficulty. This condition decreases patients quality of life and increases his risk of injury from falls. Patient will benefit from skilled physical therapy intervention to address current body structure impairments and activity limitations to improve function and work  towards goals set in current POC in order to return to prior level of function or maximal functional improvement.    OBJECTIVE IMPAIRMENTS Abnormal gait, decreased activity tolerance, decreased balance, decreased coordination, decreased endurance, decreased knowledge of use of DME, decreased mobility, difficulty walking, decreased ROM, decreased strength, impaired perceived functional ability, impaired flexibility, impaired sensation, impaired tone, impaired UE functional use, postural dysfunction, obesity, and pain.   ACTIVITY LIMITATIONS cleaning, community activity, driving, occupation, laundry, shopping, and basic mobility such as bed mobility, transfers, driving, household and community ambulation, preventing falls, floor transfers, playing with his daughter, stairs, social participation and socializing with friends .   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    REHAB POTENTIAL: Good  CLINICAL DECISION MAKING: Evolving/moderate complexity  EVALUATION COMPLEXITY: Moderate   GOALS: Goals reviewed with patient? No  SHORT TERM GOALS: Target date: 11/15/2021  Be independent with initial home exercise program for self-management of symptoms. Baseline:  initial HEP to be provided at visit 2 as appropriate (11/01/2021);  Goal status: MET  LONG TERM GOALS: Target date: 01/24/2022; Updated to 04/09/2022 on 01/15/2022.  Be independent with a long-term home exercise program for self-management of  symptoms.  Baseline: Initial HEP to be provided at visit 2 as appropriate (11/01/2021); patient currently participating well (12/06/2021; 01/15/2022);  Goal status: IN PROGRESS  2.  Demonstrate improved FOTO to equal or greater than 42 by visit #14 to demonstrate improvement in overall condition and self-reported functional ability.  Baseline: 26 (11/01/2021); 45 at visit #10 (12/06/2021); 53 at visit# 20 (01/15/2022);  Goal status: ACHIEVED 12/06/2021  3.  Patient will complete 5 Time Sit To Stand Test in equal or less than 15 seconds for 18.5 inch surface with no UE support to demonstrate improved ability to complete transfers and decrease fall risk.  Baseline: 45 second from 18.5 inch plinth with R UE support on plinth and L UE support on locked rollator and B UE support on rollator in standing. Multiple attempts needed on some reps (11/01/2021); 21 second from 18.5 inch plinth with B UE support on plinth. Walker positioned in front of him for safety but did not need it. (12/06/2021); 21 second from 18.5 inch plinth with no UE support. Walker positioned in front of him for safety but did not need it (01/15/2022);  Goal status: IN PROGRESS  4.  Patient will ambulate equal or greater than 1200 feet on 6 Minute Walk Test with [facility]  to demonstrate improved household and community mobility as well as community and social participation.  Baseline: to be tested visit 2 as appropriate (11/01/2021); 800 feet with rollator. Low back pain increased to 4-5/10 and had pain down R LE to calf (11/13/2021); 943 feet with SPC and SBA (two little standing breaks to stretch back due to tightness at low back, increased sharper pain in back and B legs, 12/06/2021);  948 feet with SPC (01/09/2022); 1045 feet with SPC no rests  but catches toes on floor at times (01/15/2022);  Goal status: IN PROGRESS  5.  Complete community, work and/or recreational activities without limitation due to current condition.  Baseline: difficulty with bed  mobility, transfers, driving, household and community ambulation, preventing falls, floor transfers, playing with his daughter, stairs, social participation and socializing with friends (11/01/2021); walking is getting easier, transfers are easier, doesn't have to use his rollator as much, stairs are still troublesome, has not tried floor transfer, still restricted socially and continues to have significant limitations with functional activities and mobility (12/06/2021); walking and transfers are easier and is using SPC at home instead of rollator, continues to be restricted socially and have significant limitations with functional activities, basic mobility, and balance.  Goal status: IN PROGRESS     PLAN: PT FREQUENCY: 2x/week  PT DURATION: 12 weeks  PLANNED INTERVENTIONS: Therapeutic exercises, Therapeutic activity, Neuromuscular re-education, Balance training, Gait training, Patient/Family education, Joint mobilization, Stair training, DME instructions, Dry Needling, and Electrical stimulation.  PLAN FOR NEXT SESSION: Plan to continue with similar strengthening progression and consider starting step up as tolerated next session to help patient be able to go to the movie theater and use a seat in higher in the theater.   Everlean Alstrom. Graylon Good, PT, DPT 01/22/22, 8:09 PM  [facility] [address] [place], [place] [address] P: [phone] I F: [phone]

## 2022-01-21 ENCOUNTER — Other Ambulatory Visit: Payer: Self-pay | Admitting: Physical Medicine & Rehabilitation

## 2022-01-21 DIAGNOSIS — M4804 Spinal stenosis, thoracic region: Secondary | ICD-10-CM

## 2022-01-21 DIAGNOSIS — M4714 Other spondylosis with myelopathy, thoracic region: Secondary | ICD-10-CM

## 2022-01-21 DIAGNOSIS — M5104 Intervertebral disc disorders with myelopathy, thoracic region: Secondary | ICD-10-CM

## 2022-01-21 DIAGNOSIS — M47816 Spondylosis without myelopathy or radiculopathy, lumbar region: Secondary | ICD-10-CM

## 2022-01-22 ENCOUNTER — Encounter: Payer: Self-pay | Admitting: Physical Therapy

## 2022-01-22 ENCOUNTER — Ambulatory Visit: Payer: Medicare Other | Admitting: Physical Therapy

## 2022-01-22 DIAGNOSIS — R262 Difficulty in walking, not elsewhere classified: Secondary | ICD-10-CM

## 2022-01-22 DIAGNOSIS — M546 Pain in thoracic spine: Secondary | ICD-10-CM

## 2022-01-22 DIAGNOSIS — M6281 Muscle weakness (generalized): Secondary | ICD-10-CM

## 2022-01-22 DIAGNOSIS — R2689 Other abnormalities of gait and mobility: Secondary | ICD-10-CM

## 2022-01-22 DIAGNOSIS — R296 Repeated falls: Secondary | ICD-10-CM

## 2022-01-22 DIAGNOSIS — M5459 Other low back pain: Secondary | ICD-10-CM

## 2022-01-24 ENCOUNTER — Ambulatory Visit: Payer: Medicare Other | Admitting: Physical Therapy

## 2022-01-24 ENCOUNTER — Encounter: Payer: Self-pay | Admitting: Physical Therapy

## 2022-01-24 DIAGNOSIS — M5459 Other low back pain: Secondary | ICD-10-CM

## 2022-01-24 DIAGNOSIS — M546 Pain in thoracic spine: Secondary | ICD-10-CM

## 2022-01-24 DIAGNOSIS — R296 Repeated falls: Secondary | ICD-10-CM

## 2022-01-24 DIAGNOSIS — R262 Difficulty in walking, not elsewhere classified: Secondary | ICD-10-CM

## 2022-01-24 DIAGNOSIS — M6281 Muscle weakness (generalized): Secondary | ICD-10-CM

## 2022-01-24 DIAGNOSIS — R2689 Other abnormalities of gait and mobility: Secondary | ICD-10-CM

## 2022-01-24 NOTE — Therapy (Signed)
OUTPATIENT PHYSICAL THERAPY TREATMENT NOTE   Patient Name: Travis Fitzpatrick MRN: [id number] DOB:08/01/1981, 41 y.o., male Today's Date: 01/24/22     PT End of Session - 01/24/22 1830     Visit Number 23    Number of Visits 24    Date for PT Re-Evaluation 04/09/22    Authorization Type MEDICARE PART B reporting period from 01/15/2022    Progress Note Due on Visit 20    PT Start Time 1825    PT Stop Time 1903    PT Time Calculation (min) 38 min    Equipment Utilized During Treatment Gait belt    Activity Tolerance Patient tolerated treatment well    Behavior During Therapy WFL for tasks assessed/performed                 Past Medical History:  Diagnosis Date   Bowel trouble    urgency   Medical history non-contributory    Urinary urgency    Past Surgical History:  Procedure Laterality Date   BACK SURGERY  2010   CIRCUMCISION     LUMBAR LAMINECTOMY/DECOMPRESSION MICRODISCECTOMY  07/20/2011   Procedure: LUMBAR LAMINECTOMY/DECOMPRESSION MICRODISCECTOMY;  Surgeon: Winfield Cunas;  Location: [place] [facility];  Service: Neurosurgery;  Laterality: N/A;  right thoracotomy with thoracic eight-nine discectomy and fusion   LUMBAR LAMINECTOMY/DECOMPRESSION MICRODISCECTOMY Right 10/06/2021   Procedure: Right Lumbar Four-Five Microdiscectomy, Right Lumbar Five-Sacal One Foraminotomy;  Surgeon: Ashok Pall, MD;  Location: [place];  Service: Neurosurgery;  Laterality: Right;  3C/RM 21   THORACIC DISCECTOMY  07/16/2012   Procedure: THORACIC DISCECTOMY;  Surgeon: Winfield Cunas, MD;  Location: [place] [facility];  Service: Neurosurgery;  Laterality: Right;  RIGHT Thoracic seven-eight  thoracic diskectomy via thoracotomy by dr Cyndia Bent   THORACIC DISCECTOMY N/A 12/15/2014   Procedure: THORACIC SEVEN TO THORACIC NINE Laminectomy ;  Surgeon: Ashok Pall, MD;  Location: [place] [facility];  Service: Neurosurgery;  Laterality: N/A;   THORACIC DISCECTOMY N/A 06/06/2017   Procedure: LAMINECTOMY THORACIC NINE-TEN;   Surgeon: Ashok Pall, MD;  Location: [place];  Service: Neurosurgery;  Laterality: N/A;  LAMINECTOMY THORACIC NINE-TEN   THORACOTOMY  07/20/2011   Procedure: THORACOTOMY OPEN FOR SPINE SURGERY;  Surgeon: Pierre Bali, MD;  Location: [facility];  Service: Vascular;  Laterality: N/A;   THORACOTOMY  07/16/2012   Procedure: THORACOTOMY OPEN FOR SPINE SURGERY;  Surgeon: Gaye Pollack, MD;  Location: [place] [facility];  Service: Thoracic;  Laterality: N/A;   Patient Active Problem List   Diagnosis Date Noted   HNP (herniated nucleus pulposus), lumbar 10/06/2021   Hand pain 06/28/2021   Lumbar facet arthropathy 01/11/2021   Thoracic spondylosis with myelopathy 01/11/2021   Thoracic spinal stenosis 06/06/2017   Stenosis, spinal, thoracic 12/15/2014   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 09/22/2014   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/2011    PCP: [facility] [place]  REFERRING PROVIDER: Ashok Pall, MD  REFERRING DIAG: 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  PERTINENT HISTORY: Patient is a 41 y.o. male who presents to outpatient physical  therapy with a referral for medical diagnosis  intervertebral disc disorder of thoracic region with myelopathy. This patient's chief complaints consist of low back pain, R LE pain/paresthesia/weakness, L LE weakness s/p right L4-5 laminotomy, microdiscectomy on 10/06/2021 leading to the following functional deficits: difficulty with bed mobility, transfers,  driving, household and community ambulation, preventing falls, floor transfers, playing with his daughter, stairs, social participation and socializing with friends. 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    Patient denies hx of cancer, stroke, seizures, lung problems, heart problems, diabetes, unexplained weight loss, and osteoporosis. Patient reports he has difficulty with making it to the bathroom on time when he gets bowel or bladder urges and has stumbling. He has history of thoracic and lumbar surgeries.  PRECAUTIONS: Other: no lifting > 10 lbs, no bending/twisting at waist (at least until 01/13/2022). Don't lay on stomach.    PATIENT GOALS: get better and get off the walker to walk with [facility], play with his daughter (15 months), get his strength back, be able to get up and down from floor.  SUBJECTIVE: Patient reports he was sore after last PT session and started feeling better today after resting most of yesterday. He contacted his doctor who scheduled him for a follow up in august, which was the soonest available and advised him to increased gabapentin. He increased gabapentin which is making him feel more tired but he is also feeling a bit better. He had swelling in his left LE yesterday but it is better today.    PAIN:  Are you having pain? 4/10 Location: surgical site at low back and right posterior thigh and anterior knee, with occasional lateral left thigh pain.    OBJECTIVE   TODAY'S TREATMENT  Therapeutic exercise: to centralize symptoms and improve ROM, strength, muscular endurance, and activity tolerance required for successful  completion of functional activities.  - NuStep level 5 using bilateral upper and lower extremities. Seat setting 11. For improved extremity mobility, muscular endurance, and activity tolerance; and to induce the analgesic effect of aerobic exercise, stimulate improved joint nutrition, and prepare body structures and systems for following interventions. x 6  minutes.Average SPM = 69. - seated knee extension on OMEGA machine, 3x10 at 35#. - seated hamstring curl at [facility] machine, 3x10 at 20#.  - standing "mountain climbers" leaning forward on TM bar, 1x10 each side. (Uncomfortable) - standing modified bird dog with hands on high plinth, 3x10 each side (feels good unless he holds purposefully, then painful).  - standing static balance with eyes closed on airex pad, 2x1 min with SBA and occasional touch down support as needed on TM bar.  - standing cone tip/right 1x10 each side with ipsilateral UE support on TM and CGA.  - standing heel raises, 1x10 B UE support (pt states he practices this at home, discontinued).  - seated toe raises with 2# AW over top of each foot, 3x10. (Challenging).   Pt required multimodal cuing for proper technique and to facilitate improved neuromuscular control, strength, range of motion, and functional ability resulting in improved performance and form.  HOME EXERCISE PROGRAM:  Access Code: [email or web address] URL: [email or web address] Date: 11/13/2021 Prepared by: Rosita Kea  Exercises - Sit to Stand  -  1 x weekly - 2-3 sets - 5-10 reps - Seated Isometric Hip Adduction with Ball  - 1 x daily - 2-3 sets - 5-10 reps - 5 seconds hold - Seated Toe Raise  - 1 x daily - 2-3 sets - 10 reps - Seated Single Arm Shoulder Row with Anchored Resistance  - 1 x daily - 3 sets - 10 reps  PATIENT EDUCATION:  Education details: Exercise purpose/form. Self management techniques. Progress, POC.  Person educated: Patient Education method: Explanation, Demonstration, Tactile cues,  and Verbal cues Education comprehension: verbalized understanding, returned demonstration, verbal cues required, tactile cues required, and needs further education   ASSESSMENT:  CLINICAL IMPRESSION: Patient again presents with more elevated pain than recently but less than last session. He was able to tolerate more standing exercises and reported reduced pain by end of session. Carefully avoided activities that had a jolting effect on his body (losing balance in sit <> stand).  Patient continues to have profound LE weakness, difficulty with coordination, and unsteadiness that severely limits his mobility and quality of life. Patient would benefit from continued management of limiting condition by skilled physical therapist to address remaining impairments and functional limitations to work towards stated goals and return to PLOF or maximal functional independence.   Patient is a 41 y.o. 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   Patient presents with significant pain, ROM, joint stiffness, sensation, balance, gait, muscle performance (strength, power, endurance), activity tolerance impairments that are limiting ability to complete his usual activities and basic mobility such as bed mobility, transfers, driving, household and community ambulation, preventing falls, floor transfers, playing with his daughter, stairs, social participation and socializing with friends without difficulty. This condition decreases patients quality of life and increases his risk of injury from falls. Patient  will benefit from skilled physical therapy intervention to address current body structure impairments and activity limitations to improve function and work towards goals set in current POC in order to return to prior level of function or maximal functional improvement.    OBJECTIVE IMPAIRMENTS Abnormal gait, decreased activity tolerance, decreased balance, decreased coordination, decreased endurance, decreased knowledge of use of DME, decreased mobility, difficulty walking, decreased ROM, decreased strength, impaired perceived functional ability, impaired flexibility, impaired sensation, impaired tone, impaired UE functional use, postural dysfunction, obesity, and pain.   ACTIVITY LIMITATIONS cleaning, community activity, driving, occupation, laundry, shopping, and basic mobility such as bed mobility, transfers, driving, household and community ambulation, preventing falls, floor transfers, playing with his daughter, stairs, social participation and socializing with friends .   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    REHAB POTENTIAL: Good  CLINICAL DECISION MAKING: Evolving/moderate complexity  EVALUATION COMPLEXITY: Moderate   GOALS: Goals  reviewed with patient? No  SHORT TERM GOALS: Target date: 11/15/2021  Be independent with initial home exercise program for self-management of symptoms. Baseline: initial HEP to be provided at visit 2 as appropriate (11/01/2021);  Goal status:  MET  LONG TERM GOALS: Target date: 01/24/2022; Updated to 04/09/2022 on 01/15/2022.  Be independent with a long-term home exercise program for self-management of symptoms.  Baseline: Initial HEP to be provided at visit 2 as appropriate (11/01/2021); patient currently participating well (12/06/2021; 01/15/2022);  Goal status: IN PROGRESS  2.  Demonstrate improved FOTO to equal or greater than 42 by visit #14 to demonstrate improvement in overall condition and self-reported functional ability.  Baseline: 26 (11/01/2021); 45 at visit #10 (12/06/2021); 53 at visit# 20 (01/15/2022);  Goal status: ACHIEVED 12/06/2021  3.  Patient will complete 5 Time Sit To Stand Test in equal or less than 15 seconds for 18.5 inch surface with no UE support to demonstrate improved ability to complete transfers and decrease fall risk.  Baseline: 45 second from 18.5 inch plinth with R UE support on plinth and L UE support on locked rollator and B UE support on rollator in standing. Multiple attempts needed on some reps (11/01/2021); 21 second from 18.5 inch plinth with B UE support on plinth. Walker positioned in front of him for safety but did not need it. (12/06/2021); 21 second from 18.5 inch plinth with no UE support. Walker positioned in front of him for safety but did not need it (01/15/2022);  Goal status: IN PROGRESS  4.  Patient will ambulate equal or greater than 1200 feet on 6 Minute Walk Test with [facility]  to demonstrate improved household and community mobility as well as community and social participation.  Baseline: to be tested visit 2 as appropriate (11/01/2021); 800 feet with rollator. Low back pain increased to 4-5/10 and had pain down R LE to calf (11/13/2021); 943 feet with SPC and SBA (two little standing breaks to stretch back due to tightness at low back, increased sharper pain in back and B legs, 12/06/2021);  948 feet with SPC (01/09/2022); 1045 feet with SPC no rests  but catches toes on floor at times (01/15/2022);  Goal  status: IN PROGRESS  5.  Complete community, work and/or recreational activities without limitation due to current condition.  Baseline: difficulty with bed mobility, transfers, driving, household and community ambulation, preventing falls, floor transfers, playing with his daughter, stairs, social participation and socializing with friends (11/01/2021); walking is getting easier, transfers are easier, doesn't have to use his rollator as much, stairs are still troublesome, has not tried floor transfer, still restricted socially and continues to have significant limitations with functional activities and mobility (12/06/2021); walking and transfers are easier and is using SPC at home instead of rollator, continues to be restricted socially and have significant limitations with functional activities, basic mobility, and balance.  Goal status: IN PROGRESS     PLAN: PT FREQUENCY: 2x/week  PT DURATION: 12 weeks  PLANNED INTERVENTIONS: Therapeutic exercises, Therapeutic activity, Neuromuscular re-education, Balance training, Gait training, Patient/Family education, Joint mobilization, Stair training, DME instructions, Dry Needling, and Electrical stimulation.  PLAN FOR NEXT SESSION: Plan to continue with similar strengthening progression and consider starting step up as tolerated next session to help patient be able to go to the movie theater and use a seat in higher in the theater.   Everlean Alstrom. Graylon Good, PT, DPT 01/24/22, 8:03 PM  [place] [address],  [place] [address] P: [phone] I F: [phone]

## 2022-01-29 ENCOUNTER — Ambulatory Visit: Payer: Medicare Other | Admitting: Physical Therapy

## 2022-01-31 ENCOUNTER — Encounter: Payer: Self-pay | Admitting: Physical Therapy

## 2022-01-31 ENCOUNTER — Ambulatory Visit: Payer: Medicare Other | Admitting: Physical Therapy

## 2022-01-31 DIAGNOSIS — M5459 Other low back pain: Secondary | ICD-10-CM | POA: Diagnosis not present

## 2022-01-31 DIAGNOSIS — M546 Pain in thoracic spine: Secondary | ICD-10-CM

## 2022-01-31 DIAGNOSIS — R296 Repeated falls: Secondary | ICD-10-CM

## 2022-01-31 DIAGNOSIS — R262 Difficulty in walking, not elsewhere classified: Secondary | ICD-10-CM

## 2022-01-31 DIAGNOSIS — M6281 Muscle weakness (generalized): Secondary | ICD-10-CM

## 2022-01-31 DIAGNOSIS — R2689 Other abnormalities of gait and mobility: Secondary | ICD-10-CM

## 2022-01-31 NOTE — Therapy (Addendum)
OUTPATIENT PHYSICAL THERAPY TREATMENT NOTE   Patient Name: Travis Fitzpatrick MRN: [id number] DOB:02-24-1981, 41 y.o., male Today's Date: 01/31/22     PT End of Session - 01/31/22 1831     Visit Number 24    Number of Visits 43    Date for PT Re-Evaluation 04/09/22    Authorization Type MEDICARE PART B reporting period from 01/15/2022    Progress Note Due on Visit 20    PT Start Time 1825    PT Stop Time 1905    PT Time Calculation (min) 40 min    Equipment Utilized During Treatment Gait belt    Activity Tolerance Patient limited by pain    Behavior During Therapy WFL for tasks assessed/performed             Past Medical History:  Diagnosis Date   Bowel trouble    urgency   Medical history non-contributory    Urinary urgency    Past Surgical History:  Procedure Laterality Date   BACK SURGERY  2010   CIRCUMCISION     LUMBAR LAMINECTOMY/DECOMPRESSION MICRODISCECTOMY  07/20/2011   Procedure: LUMBAR LAMINECTOMY/DECOMPRESSION MICRODISCECTOMY;  Surgeon: Winfield Cunas;  Location: [facility];  Service: Neurosurgery;  Laterality: N/A;  right thoracotomy with thoracic eight-nine discectomy and fusion   LUMBAR LAMINECTOMY/DECOMPRESSION MICRODISCECTOMY Right 10/06/2021   Procedure: Right Lumbar Four-Five Microdiscectomy, Right Lumbar Five-Sacal One Foraminotomy;  Surgeon: Ashok Pall, MD;  Location: [facility];  Service: Neurosurgery;  Laterality: Right;  3C/RM 21   THORACIC DISCECTOMY  07/16/2012   Procedure: THORACIC DISCECTOMY;  Surgeon: Winfield Cunas, MD;  Location: [facility];  Service: Neurosurgery;  Laterality: Right;  RIGHT Thoracic seven-eight  thoracic diskectomy via thoracotomy by dr Cyndia Bent   THORACIC DISCECTOMY N/A 12/15/2014   Procedure: THORACIC SEVEN TO THORACIC NINE Laminectomy ;  Surgeon: Ashok Pall, MD;  Location: [facility];  Service: Neurosurgery;  Laterality: N/A;   THORACIC DISCECTOMY N/A 06/06/2017   Procedure: LAMINECTOMY THORACIC NINE-TEN;  Surgeon:  Ashok Pall, MD;  Location: [facility];  Service: Neurosurgery;  Laterality: N/A;  LAMINECTOMY THORACIC NINE-TEN   THORACOTOMY  07/20/2011   Procedure: THORACOTOMY OPEN FOR SPINE SURGERY;  Surgeon: Pierre Bali, MD;  Location: [facility];  Service: Vascular;  Laterality: N/A;   THORACOTOMY  07/16/2012   Procedure: THORACOTOMY OPEN FOR SPINE SURGERY;  Surgeon: Gaye Pollack, MD;  Location: [facility];  Service: Thoracic;  Laterality: N/A;   Patient Active Problem List   Diagnosis Date Noted   HNP (herniated nucleus pulposus), lumbar 10/06/2021   Hand pain 06/28/2021   Lumbar facet arthropathy 01/11/2021   Thoracic spondylosis with myelopathy 01/11/2021   Thoracic spinal stenosis 06/06/2017   Stenosis, spinal, thoracic 12/15/2014   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 09/22/2014   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/2011    PCP: [facility]  REFERRING PROVIDER: Ashok Pall, MD  REFERRING DIAG: 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  PERTINENT HISTORY: Patient is a 41 y.o. male who presents to outpatient physical therapy with a referral  for medical diagnosis  intervertebral disc disorder of thoracic region with myelopathy. This patient's chief complaints consist of low back pain, R LE pain/paresthesia/weakness, L LE weakness s/p right L4-5 laminotomy, microdiscectomy on 10/06/2021 leading to the following functional deficits: difficulty with bed mobility, transfers, driving,  household and community ambulation, preventing falls, floor transfers, playing with his daughter, stairs, social participation and socializing with friends. 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    Patient denies hx of cancer, stroke, seizures, lung problems, heart problems, diabetes, unexplained weight loss, and osteoporosis. Patient reports he has difficulty with making it to the bathroom on time when he gets bowel or bladder urges and has stumbling. He has history of thoracic and lumbar surgeries.  PRECAUTIONS: Other: no lifting > 10 lbs, no bending/twisting at waist (at least until 01/13/2022). Don't lay on stomach.    PATIENT GOALS: get better and get off the walker to walk with [facility], play with his daughter (15 months), get his strength back, be able to get up and down from floor.  SUBJECTIVE: Patient states his back is hurting upon arrival. He has 5-6/10 pain in the low back and right hip. The right hip pain started Saturday. He thinks the pain is coming from the pain he gets in the groin and lower right abdomen when he needs to poop or pee. He has been stopped up for 3 days and he thinks this may be causing the pain he is getting in the hip. He got married in [place] this weekend and didn't make it back in time for his Monday PT appointment due to downpours of rain holding up traffic.    PAIN:  Are you having pain? 4/10 Location: surgical site at low back and lower abdomen/hip region.    OBJECTIVE   TODAY'S TREATMENT  Therapeutic exercise: to centralize symptoms and improve ROM, strength, muscular endurance, and activity tolerance  required for successful completion of functional activities.  - NuStep level 5-3 using bilateral upper and lower extremities. Seat setting 11. For improved extremity mobility, muscular endurance, and activity tolerance; and to induce the analgesic effect of aerobic exercise, stimulate improved joint nutrition, and prepare body structures and systems for following interventions. x 3  minutes (discontinued due to increasing R hip pain with each R LE extension).  - ambulation around the clinic with SPC and CGA, 600 feet (paused during ambulation for bathroom break).  - ambulation while dribbling basketball, ~ 100 feet forwards walking, 30 feet each way lateral stepping. CGA (unsteady at times).  - forwards step up/down to 4 inch step with TM bar in front for support if needed and CGA, 1x10 with each foot leading (unsteady, pain in right hip with left leading step down).  - standing lateral tosses to/from PT with basketball while leaning on edge of high plinth for balance, 1x10 each side (too easy).  - standing palloff press with BlueTB, 1x10, 1x15, 1x20 each side. Went well until suddenly L LE started feeling weak during first side of last set and patient needed to sit prior to completing exercise - then able to complete exercise with difficulty using periodic standing breaks).  - hooklying low trunk rotation ~4 min (improved pain/numbness in  legs).   Pt required multimodal cuing for proper technique and to facilitate improved neuromuscular control, strength, range of motion, and functional ability resulting in improved performance and form.  HOME EXERCISE PROGRAM:  Access Code: [email or web address] URL: [email or web address] Date: 11/13/2021 Prepared by: Rosita Kea  Exercises - Sit to Stand  - 1 x weekly - 2-3 sets - 5-10 reps - Seated Isometric Hip Adduction with Ball  - 1 x daily - 2-3 sets - 5-10 reps - 5 seconds hold - Seated Toe Raise  - 1 x daily - 2-3 sets - 10 reps - Seated Single Arm  Shoulder Row with Anchored Resistance  - 1 x daily - 3 sets - 10 reps  PATIENT EDUCATION:  Education details: Exercise purpose/form. Self management techniques.   Person educated: Patient Education method: Explanation, Demonstration, Tactile cues, and Verbal cues Education comprehension: verbalized understanding, returned demonstration, verbal cues required, tactile cues required, and needs further education   ASSESSMENT:  CLINICAL IMPRESSION: Patient presents with elevated pain and his session was hindered by intermittent pain at the left hip/lower abdominal region that felt better in standing. He was provided with a handout with instructions for external manual facilitation over the lower abdomen of peristalsis in the colon to help assist him in having a BM. He was able to complete a lot of his exercise in standing today but again had sudden weakness followed by pain in his L LE during last set of pallof press. Recommend completing less pallof press volume next session. Pain/paresthesia was improved with hooklying LTR and patient was able to get to his care without assistance. Plan to continue working on core, LE, and functional strength and balance as tolerated next session. Patient would benefit from continued management of limiting condition by skilled physical therapist to address remaining impairments and functional limitations to work towards stated goals and return to PLOF or maximal functional independence.    Patient is a 41 y.o. 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    Patient presents with significant pain, ROM, joint stiffness, sensation, balance, gait, muscle performance (strength, power, endurance), activity tolerance impairments that are limiting ability to complete his usual activities and basic mobility such as bed mobility, transfers, driving, household and community ambulation, preventing falls, floor transfers, playing with his daughter, stairs, social participation and socializing with friends without difficulty. This condition decreases patients quality of life and increases his risk of injury from falls. Patient will benefit from skilled physical therapy intervention to address current body structure impairments and activity limitations to improve function and work towards goals set in current POC in order to return to prior level of function or maximal functional improvement.    OBJECTIVE IMPAIRMENTS Abnormal gait, decreased activity tolerance, decreased balance, decreased coordination, decreased endurance, decreased knowledge of use of DME, decreased mobility, difficulty walking, decreased ROM, decreased strength, impaired perceived functional ability, impaired flexibility, impaired sensation, impaired tone, impaired UE functional use, postural dysfunction, obesity, and pain.   ACTIVITY LIMITATIONS cleaning, community activity, driving, occupation, laundry, shopping, and basic mobility such as bed mobility, transfers, driving, household and community ambulation, preventing falls, floor  transfers, playing with his daughter, stairs, social participation and socializing with friends .   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    REHAB POTENTIAL: Good  CLINICAL DECISION MAKING: Evolving/moderate complexity  EVALUATION COMPLEXITY: Moderate   GOALS: Goals reviewed with patient? No  SHORT TERM GOALS: Target date: 11/15/2021  Be independent with initial home exercise program for self-management of symptoms. Baseline: initial HEP to be provided at visit 2 as appropriate (11/01/2021);  Goal status: MET  LONG TERM GOALS: Target date: 01/24/2022; Updated to 04/09/2022 on 01/15/2022.  Be independent with a long-term home exercise program for self-management of symptoms.  Baseline: Initial HEP to be provided at visit 2 as appropriate (11/01/2021); patient currently participating well (12/06/2021; 01/15/2022);  Goal status: IN PROGRESS  2.  Demonstrate improved FOTO to equal or greater than 42 by visit #14 to demonstrate improvement in overall condition and self-reported functional ability.  Baseline: 26 (11/01/2021); 45 at visit #10 (12/06/2021); 53 at visit# 20 (01/15/2022);  Goal status: ACHIEVED 12/06/2021  3.  Patient will complete 5 Time Sit To Stand Test in equal or less than 15 seconds for 18.5 inch surface with no UE support to demonstrate improved ability to complete transfers and decrease fall risk.  Baseline: 45 second from 18.5 inch plinth with R UE support on plinth and L UE support on locked rollator and B UE support on rollator in standing. Multiple attempts needed on some reps (11/01/2021); 21 second from 18.5 inch plinth with B UE support on plinth. Walker positioned in front of him for safety but did not need it. (12/06/2021); 21 second from 18.5 inch plinth with no UE support. Walker positioned in front of him for safety but did not need it (01/15/2022);  Goal status: IN PROGRESS  4.  Patient will ambulate equal or greater than 1200 feet on 6  Minute Walk Test with [facility]  to demonstrate improved household and community mobility as well as community and social participation.  Baseline: to be tested visit 2 as appropriate (11/01/2021); 800 feet with rollator. Low back pain increased to 4-5/10 and had pain down R LE to calf (11/13/2021); 943 feet with SPC and SBA (two little standing breaks to stretch back due to tightness at low back, increased sharper pain in back and B legs, 12/06/2021);  948 feet with SPC (01/09/2022); 1045 feet with SPC no rests  but catches toes on floor at times (01/15/2022);  Goal status: IN PROGRESS  5.  Complete community, work and/or recreational activities without limitation due to current condition.  Baseline: difficulty with bed mobility, transfers, driving, household and community ambulation, preventing falls, floor transfers, playing with his daughter, stairs, social participation and socializing with friends (11/01/2021); walking is getting easier, transfers are easier, doesn't have to use his rollator as much, stairs are still troublesome, has not tried floor transfer, still restricted socially and continues to have significant limitations with functional activities and mobility (12/06/2021); walking and transfers are easier and is using [place] at home instead of rollator, continues to be restricted socially and have significant limitations with functional activities, basic mobility, and  balance.  Goal status: IN PROGRESS     PLAN: PT FREQUENCY: 2x/week  PT DURATION: 12 weeks  PLANNED INTERVENTIONS: Therapeutic exercises, Therapeutic activity, Neuromuscular re-education, Balance training, Gait training, Patient/Family education, Joint mobilization, Stair training, DME instructions, Dry Needling, and Electrical stimulation.  PLAN FOR NEXT SESSION: Plan to continue with similar strengthening progression and consider starting step up as tolerated next session to help patient be able to go to the movie theater and use a  seat in higher in the theater.   Everlean Alstrom. Graylon Good, PT, DPT 01/31/22, 8:15 PM  [facility] [address] [place], [place] [address] P: [phone] I F: [phone]

## 2022-02-05 ENCOUNTER — Encounter: Payer: Self-pay | Admitting: Physical Therapy

## 2022-02-05 ENCOUNTER — Ambulatory Visit: Payer: Medicare Other

## 2022-02-05 DIAGNOSIS — M6281 Muscle weakness (generalized): Secondary | ICD-10-CM

## 2022-02-05 DIAGNOSIS — R2689 Other abnormalities of gait and mobility: Secondary | ICD-10-CM

## 2022-02-05 DIAGNOSIS — R262 Difficulty in walking, not elsewhere classified: Secondary | ICD-10-CM

## 2022-02-05 DIAGNOSIS — M5459 Other low back pain: Secondary | ICD-10-CM | POA: Diagnosis not present

## 2022-02-05 DIAGNOSIS — M546 Pain in thoracic spine: Secondary | ICD-10-CM

## 2022-02-07 ENCOUNTER — Ambulatory Visit: Payer: Medicare Other

## 2022-02-07 ENCOUNTER — Encounter: Payer: Self-pay | Admitting: Physical Therapy

## 2022-02-07 DIAGNOSIS — M5459 Other low back pain: Secondary | ICD-10-CM | POA: Diagnosis not present

## 2022-02-07 DIAGNOSIS — M6281 Muscle weakness (generalized): Secondary | ICD-10-CM

## 2022-02-07 DIAGNOSIS — R262 Difficulty in walking, not elsewhere classified: Secondary | ICD-10-CM

## 2022-02-07 DIAGNOSIS — M546 Pain in thoracic spine: Secondary | ICD-10-CM

## 2022-02-07 DIAGNOSIS — R2689 Other abnormalities of gait and mobility: Secondary | ICD-10-CM

## 2022-02-07 NOTE — Therapy (Signed)
OUTPATIENT PHYSICAL THERAPY TREATMENT NOTE   Patient Name: Travis Fitzpatrick MRN: [id number] DOB:[birth year], 41 y.o., male Today's Date: 02/07/22     PT End of Session - 02/07/22 1815     Visit Number 26    Number of Visits 43    Date for PT Re-Evaluation 04/09/22    Authorization Type MEDICARE PART B reporting period from 01/15/2022    Progress Note Due on Visit 20    PT Start Time 1814    PT Stop Time 1859    PT Time Calculation (min) 45 min    Equipment Utilized During Treatment Gait belt    Activity Tolerance Patient tolerated treatment well    Behavior During Therapy WFL for tasks assessed/performed             Past Medical History:  Diagnosis Date   Bowel trouble    urgency   Medical history non-contributory    Urinary urgency    Past Surgical History:  Procedure Laterality Date   BACK SURGERY  2010   CIRCUMCISION     LUMBAR LAMINECTOMY/DECOMPRESSION MICRODISCECTOMY  07/20/2011   Procedure: LUMBAR LAMINECTOMY/DECOMPRESSION MICRODISCECTOMY;  Surgeon: Winfield Cunas;  Location: [facility];  Service: Neurosurgery;  Laterality: N/A;  right thoracotomy with thoracic eight-nine discectomy and fusion   LUMBAR LAMINECTOMY/DECOMPRESSION MICRODISCECTOMY Right 10/06/2021   Procedure: Right Lumbar Four-Five Microdiscectomy, Right Lumbar Five-Sacal One Foraminotomy;  Surgeon: Ashok Pall, MD;  Location: [facility];  Service: Neurosurgery;  Laterality: Right;  3C/RM 21   THORACIC DISCECTOMY  07/16/2012   Procedure: THORACIC DISCECTOMY;  Surgeon: Winfield Cunas, MD;  Location: [facility];  Service: Neurosurgery;  Laterality: Right;  RIGHT Thoracic seven-eight  thoracic diskectomy via thoracotomy by dr Cyndia Bent   THORACIC DISCECTOMY N/A 12/15/2014   Procedure: THORACIC SEVEN TO THORACIC NINE Laminectomy ;  Surgeon: Ashok Pall, MD;  Location: [facility] [facility];  Service: Neurosurgery;  Laterality: N/A;   THORACIC DISCECTOMY N/A 06/06/2017   Procedure: LAMINECTOMY THORACIC NINE-TEN;   Surgeon: Ashok Pall, MD;  Location: [facility];  Service: Neurosurgery;  Laterality: N/A;  LAMINECTOMY THORACIC NINE-TEN   THORACOTOMY  07/20/2011   Procedure: THORACOTOMY OPEN FOR SPINE SURGERY;  Surgeon: Pierre Bali, MD;  Location: [facility];  Service: Vascular;  Laterality: N/A;   THORACOTOMY  07/16/2012   Procedure: THORACOTOMY OPEN FOR SPINE SURGERY;  Surgeon: Gaye Pollack, MD;  Location: [facility];  Service: Thoracic;  Laterality: N/A;   Patient Active Problem List   Diagnosis Date Noted   HNP (herniated nucleus pulposus), lumbar 10/06/2021   Hand pain 06/28/2021   Lumbar facet arthropathy 01/11/2021   Thoracic spondylosis with myelopathy 01/11/2021   Thoracic spinal stenosis 06/06/2017   Stenosis, spinal, thoracic 12/15/2014   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 09/22/2014   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/2011    PCP: [facility]  REFERRING PROVIDER: Ashok Pall, MD  REFERRING DIAG: 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  PERTINENT HISTORY: Patient is a 41 y.o. 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 This patient's chief complaints consist of low back pain, R LE pain/paresthesia/weakness, L LE weakness s/p right L4-5 laminotomy, microdiscectomy on 10/06/2021 leading to the following functional deficits: difficulty with bed mobility, transfers, driving, household  and community ambulation, preventing falls, floor transfers, playing with his daughter, stairs, social participation and socializing with friends. 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    Patient denies hx of cancer, stroke, seizures, lung problems, heart problems, diabetes, unexplained weight loss, and osteoporosis. Patient reports he has difficulty with making it to the bathroom on time when he gets bowel or bladder urges and has stumbling. He has history of thoracic and lumbar surgeries.  PRECAUTIONS: Other: no lifting > 10 lbs, no bending/twisting at waist (at least until 01/13/2022). Don't lay on stomach.    PATIENT GOALS: get better and get off the walker to walk with [facility], play with his daughter (15 months), get his strength back, be able to get up and down from floor.  SUBJECTIVE: Pt reports 3/10 NPS in low back. Has soreness from last session.    PAIN:  Are you having pain? 3/10 Location: surgical site at low back and lower abdomen/hip region.    OBJECTIVE   TODAY'S TREATMENT  02/07/22:  There.ex:   Nu-Step L5 for 5 minutes with BUE use for gentle lumbar mobility and LE endurance. Seat 11.               Ambulating 200' with 2# AW's with SPC. CGA. Discontinued due to increased pain.    Ambulating 100' with SPC CGA. Attempting to improve muscle cramping in RLE in hamstrings and gastroc.    Seated R hamstring and gastroc stretch with belt: 2x30 sec    Seated RLE sciatic nerve floss technique coordinating cervical extension with plantarflexion with knee extended.     Alternating 6" step ups with BUE support on rails.  3x10/LE.             Standing palloff press with BlueTB, 3x10/side standing on airex pad, CGA              Side steps with (3 yellow) hurdles and BUE support: 1x4/direction step to pattern. Reports hard. CGA throughout.   Forward step overs with step to with 3 yellow hurdles with SUE support. 1x4/direction          HOME EXERCISE PROGRAM:  Access Code: [email or web address] URL: [email or web address] Date: 11/13/2021 Prepared by: Rosita Kea  Exercises - Sit to Stand  - 1 x weekly - 2-3 sets - 5-10 reps - Seated Isometric Hip Adduction with Ball  - 1 x daily - 2-3 sets - 5-10 reps - 5 seconds hold - Seated Toe Raise  - 1 x daily - 2-3 sets - 10 reps - Seated Single Arm Shoulder Row with Anchored Resistance  - 1 x daily - 3 sets - 10 reps  PATIENT EDUCATION:  Education details: Form/technique with exercise.  Person educated: Patient Education method: Explanation, Demonstration, Tactile cues, and Verbal cues Education comprehension: verbalized understanding, returned demonstration, verbal cues required, tactile cues required, and needs further education   ASSESSMENT:  CLINICAL IMPRESSION: Continuing PT POC with focus on BLE  strength, gait and balance. Session adjusted accordingly to address RLE muscle cramping and increased LBP with progression of resisted ambulation. Pt remains highly motivated performing standing balance and strengthening exercises despite increased LBP. Due to progression pt reporting pain up to 4/10 NPS which is tolerable to pt. Will continue PT POC with progression of gait, balance, and strength to reduce risk of falls and overall optimize independence in mobility and ADL completion.    OBJECTIVE IMPAIRMENTS Abnormal gait, decreased activity tolerance, decreased balance, decreased coordination, decreased endurance, decreased knowledge of use of DME, decreased mobility, difficulty walking, decreased ROM, decreased strength, impaired perceived functional ability,  impaired flexibility, impaired sensation, impaired tone, impaired UE functional use, postural dysfunction, obesity, and pain.   ACTIVITY LIMITATIONS cleaning, community activity, driving, occupation, laundry, shopping, and basic mobility such as bed mobility, transfers, driving, household and community ambulation, preventing falls, floor transfers, playing with his daughter, stairs, social participation and socializing with friends .   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    REHAB POTENTIAL: Good  CLINICAL DECISION MAKING: Evolving/moderate complexity  EVALUATION COMPLEXITY: Moderate   GOALS: Goals reviewed with patient? No  SHORT TERM GOALS: Target date: 11/15/2021  Be independent with initial home exercise program for self-management of symptoms. Baseline: initial HEP to be provided at visit 2 as appropriate (11/01/2021);  Goal status: MET  LONG TERM GOALS: Target date: 01/24/2022; Updated to 04/09/2022 on 01/15/2022.  Be independent with a long-term home exercise program for self-management of symptoms.  Baseline: Initial HEP to be provided at visit 2 as appropriate (11/01/2021); patient currently participating well (12/06/2021; 01/15/2022);  Goal status: IN PROGRESS  2.  Demonstrate improved FOTO to equal or greater than 42 by visit #14 to demonstrate improvement in overall condition and self-reported functional ability.  Baseline: 26 (11/01/2021); 45 at visit #10 (12/06/2021); 53 at visit#  20 (01/15/2022);  Goal status: ACHIEVED 12/06/2021  3.  Patient will complete 5 Time Sit To Stand Test in equal or less than 15 seconds for 18.5 inch surface with no UE support to demonstrate improved ability to complete transfers and decrease fall risk.  Baseline: 45 second from 18.5 inch plinth with R UE support on plinth and L UE support on locked rollator and B UE support on rollator in standing. Multiple attempts needed on some reps (11/01/2021); 21 second from 18.5 inch plinth with B UE support on plinth. Walker positioned in front of him for safety but did not need it. (12/06/2021); 21 second from 18.5 inch plinth with no UE support. Walker positioned in front of him for safety but did not need it (01/15/2022);  Goal status: IN PROGRESS  4.  Patient will ambulate equal or greater than 1200 feet on 6 Minute Walk Test with [facility]  to demonstrate improved household and community mobility as well as community and social participation.  Baseline: to be tested visit 2 as appropriate (11/01/2021); 800 feet with rollator. Low back pain increased to 4-5/10 and had pain down R LE to calf (11/13/2021); 943 feet with SPC and SBA (two little standing breaks to stretch back due to tightness  at low back, increased sharper pain in back and B legs, 12/06/2021);  948 feet with SPC (01/09/2022); 1045 feet with SPC no rests  but catches toes on floor at times (01/15/2022);  Goal status: IN PROGRESS  5.  Complete community, work and/or recreational activities without limitation due to current condition.  Baseline: difficulty with bed mobility, transfers, driving, household and community ambulation, preventing falls, floor transfers, playing with his daughter, stairs, social participation and socializing with friends (11/01/2021); walking is getting easier, transfers are easier, doesn't have to use his rollator as much, stairs are still troublesome, has not tried floor transfer, still restricted socially and continues to have  significant limitations with functional activities and mobility (12/06/2021); walking and transfers are easier and is using SPC at home instead of rollator, continues to be restricted socially and have significant limitations with functional activities, basic mobility, and balance.  Goal status: IN PROGRESS     PLAN: PT FREQUENCY: 2x/week  PT DURATION: 12 weeks  PLANNED INTERVENTIONS: Therapeutic exercises, Therapeutic activity, Neuromuscular re-education, Balance training, Gait training, Patient/Family education, Joint mobilization, Stair training, DME instructions, Dry Needling, and Electrical stimulation.  PLAN FOR NEXT SESSION: See how pt tolerated progression of therex.   Salem Caster. Fairly IV, PT, DPT Physical Therapist- [facility]  02/07/22, 9:46 PM  [facility] [address] [place], [place] [address] P: [phone] I F: [phone]

## 2022-02-14 ENCOUNTER — Encounter: Payer: Self-pay | Admitting: Physical Therapy

## 2022-02-14 ENCOUNTER — Ambulatory Visit: Payer: Medicare Other | Attending: Neurosurgery | Admitting: Physical Therapy

## 2022-02-14 DIAGNOSIS — R296 Repeated falls: Secondary | ICD-10-CM | POA: Insufficient documentation

## 2022-02-14 DIAGNOSIS — M6281 Muscle weakness (generalized): Secondary | ICD-10-CM

## 2022-02-14 DIAGNOSIS — M5459 Other low back pain: Secondary | ICD-10-CM

## 2022-02-14 DIAGNOSIS — R262 Difficulty in walking, not elsewhere classified: Secondary | ICD-10-CM

## 2022-02-14 DIAGNOSIS — M546 Pain in thoracic spine: Secondary | ICD-10-CM | POA: Diagnosis present

## 2022-02-14 DIAGNOSIS — R2689 Other abnormalities of gait and mobility: Secondary | ICD-10-CM | POA: Diagnosis present

## 2022-02-14 NOTE — Therapy (Signed)
OUTPATIENT PHYSICAL THERAPY TREATMENT NOTE   Patient Name: Travis Fitzpatrick MRN: [id number] DOB:1981-02-20, 41 y.o., male Today's Date: 02/14/22     PT End of Session - 02/14/22 1916     Visit Number 27    Number of Visits 43    Date for PT Re-Evaluation 04/09/22    Authorization Type MEDICARE PART B reporting period from 01/15/2022    Progress Note Due on Visit 20    PT Start Time 1905    PT Stop Time 1945    PT Time Calculation (min) 40 min    Equipment Utilized During Treatment Gait belt    Activity Tolerance Patient tolerated treatment well    Behavior During Therapy WFL for tasks assessed/performed              Past Medical History:  Diagnosis Date   Bowel trouble    urgency   Medical history non-contributory    Urinary urgency    Past Surgical History:  Procedure Laterality Date   BACK SURGERY  2010   CIRCUMCISION     LUMBAR LAMINECTOMY/DECOMPRESSION MICRODISCECTOMY  07/20/2011   Procedure: LUMBAR LAMINECTOMY/DECOMPRESSION MICRODISCECTOMY;  Surgeon: Winfield Cunas;  Location: [place] [facility];  Service: Neurosurgery;  Laterality: N/A;  right thoracotomy with thoracic eight-nine discectomy and fusion   LUMBAR LAMINECTOMY/DECOMPRESSION MICRODISCECTOMY Right 10/06/2021   Procedure: Right Lumbar Four-Five Microdiscectomy, Right Lumbar Five-Sacal One Foraminotomy;  Surgeon: Ashok Pall, MD;  Location: [place];  Service: Neurosurgery;  Laterality: Right;  3C/RM 21   THORACIC DISCECTOMY  07/16/2012   Procedure: THORACIC DISCECTOMY;  Surgeon: Winfield Cunas, MD;  Location: [place] [facility];  Service: Neurosurgery;  Laterality: Right;  RIGHT Thoracic seven-eight  thoracic diskectomy via thoracotomy by dr Cyndia Bent   THORACIC DISCECTOMY N/A 12/15/2014   Procedure: THORACIC SEVEN TO THORACIC NINE Laminectomy ;  Surgeon: Ashok Pall, MD;  Location: [place] [facility];  Service: Neurosurgery;  Laterality: N/A;   THORACIC DISCECTOMY N/A 06/06/2017   Procedure: LAMINECTOMY THORACIC NINE-TEN;   Surgeon: Ashok Pall, MD;  Location: [place];  Service: Neurosurgery;  Laterality: N/A;  LAMINECTOMY THORACIC NINE-TEN   THORACOTOMY  07/20/2011   Procedure: THORACOTOMY OPEN FOR SPINE SURGERY;  Surgeon: Pierre Bali, MD;  Location: [facility];  Service: Vascular;  Laterality: N/A;   THORACOTOMY  07/16/2012   Procedure: THORACOTOMY OPEN FOR SPINE SURGERY;  Surgeon: Gaye Pollack, MD;  Location: [place] [facility];  Service: Thoracic;  Laterality: N/A;   Patient Active Problem List   Diagnosis Date Noted   HNP (herniated nucleus pulposus), lumbar 10/06/2021   Hand pain 06/28/2021   Lumbar facet arthropathy 01/11/2021   Thoracic spondylosis with myelopathy 01/11/2021   Thoracic spinal stenosis 06/06/2017   Stenosis, spinal, thoracic 12/15/2014   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 09/22/2014   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/2011    PCP: [facility] [place]  REFERRING PROVIDER: Ashok Pall, MD  REFERRING DIAG: 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  PERTINENT HISTORY: Patient is a 41 y.o. male who presents to outpatient physical therapy with a referral for medical  diagnosis  intervertebral disc disorder of thoracic region with myelopathy. This patient's chief complaints consist of low back pain, R LE pain/paresthesia/weakness, L LE weakness s/p right L4-5 laminotomy, microdiscectomy on 10/06/2021 leading to the following functional deficits: difficulty with bed mobility, transfers, driving, household  and community ambulation, preventing falls, floor transfers, playing with his daughter, stairs, social participation and socializing with friends. 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    Patient denies hx of cancer, stroke, seizures, lung problems, heart problems, diabetes, unexplained weight loss, and osteoporosis. Patient reports he has difficulty with making it to the bathroom on time when he gets bowel or bladder urges and has stumbling. He has history of thoracic and lumbar surgeries.  PRECAUTIONS: Other: no lifting > 10 lbs, no bending/twisting at waist (at least until 01/13/2022). Don't lay on stomach.    PATIENT GOALS: get better and get off the walker to walk with [facility], play with his daughter (15 months), get his strength back, be able to get up and down from floor.  SUBJECTIVE: Pt reports 3/10 NPS in low back. Had soreness after last session.    PAIN:  Are you having pain? 3/10 Location: surgical site at low back and tightness in thighs.    OBJECTIVE   TODAY'S TREATMENT  Therapeutic exercise: to centralize symptoms and improve ROM, strength, muscular endurance, and activity tolerance required for successful completion of functional activities.  - NuStep level 6 using bilateral upper and lower extremities. Seat setting 11. For improved extremity mobility, muscular endurance, and activity tolerance; and to induce the analgesic effect of aerobic exercise, stimulate improved joint nutrition, and prepare body structures and systems for following interventions. Average SPM = 79. - Alternating 6" step ups with BUE support on rails.  3x10/LE. - Standing palloff press with BlueTB, 3x10/side standing on airex pad, CGA  - Side steps with (3 yellow) hurdles and BUE support: 1x4/direction step to pattern. Reports hard. SBA throughout.  - Forward step overs with step to with 3 yellow hurdles with SUE support. 1x4/direction/foot  - ambulation over mat with half spike balls under it to simulate uneven ground, SUE support and CGA. 8 times across 6 feet.  Pt required multimodal cuing for proper technique and to facilitate improved neuromuscular control, strength, range of motion, and functional ability resulting in improved performance and form.   HOME EXERCISE PROGRAM:  Access Code: [email or web address] URL: [email or web address] Date: 11/13/2021 Prepared by: Rosita Kea  Exercises - Sit to Stand  - 1 x weekly - 2-3 sets - 5-10 reps - Seated Isometric Hip Adduction with Ball  - 1 x daily - 2-3 sets - 5-10 reps - 5 seconds hold - Seated Toe Raise  - 1 x daily - 2-3 sets - 10 reps - Seated Single Arm Shoulder Row with Anchored Resistance  - 1 x daily - 3 sets - 10 reps  PATIENT EDUCATION:  Education details: Form/technique with exercise.  Person educated: Patient Education method: Explanation, Demonstration, Tactile cues, and Verbal cues Education comprehension: verbalized understanding, returned demonstration, verbal cues required, tactile cues required, and needs further education   ASSESSMENT:  CLINICAL IMPRESSION: Patient was able to continue working on functional strength and balance in standing position  today with occasional seated breaks. Patient also needed occasional standing breaks due to "tightening" felt in his low back. He demonstrated good tolerance to progressions from last PT session as well as progressions introduced today. Patient continues to be limited by significant strength, motor control, and balance deficits that are hindering his function and quality of life. Patient would benefit from continued  management of limiting condition by skilled physical therapist to address remaining impairments and functional limitations to work towards stated goals and return to PLOF or maximal functional independence.   OBJECTIVE IMPAIRMENTS Abnormal gait, decreased activity tolerance, decreased balance, decreased coordination, decreased endurance, decreased knowledge of use of DME, decreased mobility, difficulty walking, decreased ROM, decreased strength, impaired perceived functional ability, impaired flexibility, impaired sensation, impaired tone, impaired UE functional use, postural dysfunction, obesity, and pain.   ACTIVITY LIMITATIONS cleaning, community activity, driving, occupation, laundry, shopping, and basic mobility such as bed mobility, transfers, driving, household and community ambulation, preventing falls, floor transfers, playing with his daughter, stairs, social participation and socializing with friends .   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    REHAB POTENTIAL: Good  CLINICAL DECISION MAKING: Evolving/moderate complexity  EVALUATION COMPLEXITY: Moderate   GOALS: Goals reviewed with patient? No  SHORT TERM GOALS: Target date: 11/15/2021  Be independent with initial home exercise program for self-management of symptoms. Baseline: initial HEP to be provided at visit 2 as appropriate (11/01/2021);  Goal status: MET  LONG TERM GOALS: Target date: 01/24/2022; Updated to 04/09/2022 on  01/15/2022.  Be independent with a long-term home exercise program for self-management of symptoms.  Baseline: Initial HEP to be provided at visit 2 as appropriate (11/01/2021); patient currently participating well (12/06/2021; 01/15/2022);  Goal status: IN PROGRESS  2.  Demonstrate improved FOTO to equal or greater than 42 by visit #14 to demonstrate improvement in overall condition and self-reported functional ability.  Baseline: 26 (11/01/2021); 45 at visit #10 (12/06/2021); 53 at visit# 20 (01/15/2022);  Goal status: ACHIEVED 12/06/2021  3.  Patient will complete 5 Time Sit To Stand Test in equal or less than 15 seconds for 18.5 inch surface with no UE support to demonstrate improved ability to complete transfers and decrease fall risk.  Baseline: 45 second from 18.5 inch plinth with R UE support on plinth and L UE support on locked rollator and B UE support on rollator in standing. Multiple attempts needed on some reps (11/01/2021); 21 second from 18.5 inch plinth with B UE support on plinth. Walker positioned in front of him for safety but did not need it. (12/06/2021); 21 second from 18.5 inch plinth with no UE support. Walker positioned in front of him for safety but did not need it (01/15/2022);  Goal status: IN PROGRESS  4.  Patient will ambulate equal or greater than 1200 feet on 6 Minute Walk Test with [facility]  to demonstrate improved household and community mobility as well as community and social participation.  Baseline: to be tested visit 2 as appropriate (11/01/2021); 800 feet with rollator. Low back pain increased to 4-5/10 and had pain down R LE to calf (11/13/2021);  943 feet with SPC and SBA (two little standing breaks to stretch back due to tightness at low back, increased sharper pain in back and B legs, 12/06/2021);  948 feet with SPC (01/09/2022); 1045 feet with SPC no rests  but catches toes on floor at times (01/15/2022);  Goal status: IN PROGRESS  5.  Complete community, work and/or recreational  activities without limitation due to current condition.  Baseline: difficulty with bed mobility, transfers, driving, household and community ambulation, preventing falls, floor transfers, playing with his daughter, stairs, social participation and socializing with friends (11/01/2021); walking is getting easier, transfers are easier, doesn't have to use his rollator as much, stairs are still troublesome, has not tried floor transfer, still restricted socially and continues to have significant limitations with functional activities and mobility (12/06/2021); walking and transfers are easier and is using SPC at home instead of rollator, continues to be restricted socially and have significant limitations with functional activities, basic mobility, and balance.  Goal status: IN PROGRESS     PLAN: PT FREQUENCY: 2x/week  PT DURATION: 12 weeks  PLANNED INTERVENTIONS: Therapeutic exercises, Therapeutic activity, Neuromuscular re-education, Balance training, Gait training, Patient/Family education, Joint mobilization, Stair training, DME instructions, Dry Needling, and Electrical stimulation.  PLAN FOR NEXT SESSION: Update HEP as tolerated. Progressive LE, core, functional strengthening and balance exercises as tolerated and appropriate.    Everlean Alstrom. Graylon Good, PT, DPT 02/14/22, 8:00 PM  [facility] [address] [place], [place] [address] P: [phone] I F: [phone]

## 2022-02-15 ENCOUNTER — Other Ambulatory Visit: Payer: Self-pay | Admitting: Physical Medicine & Rehabilitation

## 2022-02-15 DIAGNOSIS — M47816 Spondylosis without myelopathy or radiculopathy, lumbar region: Secondary | ICD-10-CM

## 2022-02-15 DIAGNOSIS — M5104 Intervertebral disc disorders with myelopathy, thoracic region: Secondary | ICD-10-CM

## 2022-02-15 DIAGNOSIS — M4714 Other spondylosis with myelopathy, thoracic region: Secondary | ICD-10-CM

## 2022-02-15 DIAGNOSIS — M4804 Spinal stenosis, thoracic region: Secondary | ICD-10-CM

## 2022-02-19 ENCOUNTER — Encounter: Payer: Medicare Other | Admitting: Physical Therapy

## 2022-02-20 ENCOUNTER — Encounter: Payer: Self-pay | Admitting: Physical Therapy

## 2022-02-20 ENCOUNTER — Ambulatory Visit: Payer: Medicare Other | Admitting: Physical Therapy

## 2022-02-20 DIAGNOSIS — M5459 Other low back pain: Secondary | ICD-10-CM

## 2022-02-20 DIAGNOSIS — R2689 Other abnormalities of gait and mobility: Secondary | ICD-10-CM

## 2022-02-20 DIAGNOSIS — M6281 Muscle weakness (generalized): Secondary | ICD-10-CM

## 2022-02-20 DIAGNOSIS — R262 Difficulty in walking, not elsewhere classified: Secondary | ICD-10-CM

## 2022-02-20 DIAGNOSIS — R296 Repeated falls: Secondary | ICD-10-CM

## 2022-02-20 DIAGNOSIS — M546 Pain in thoracic spine: Secondary | ICD-10-CM

## 2022-02-20 NOTE — Therapy (Signed)
OUTPATIENT PHYSICAL THERAPY TREATMENT NOTE   Patient Name: Travis Fitzpatrick MRN: [id number] DOB:11/20/1980, 41 y.o., male Today's Date: 02/20/22     PT End of Session - 02/20/22 1823     Visit Number 28    Number of Visits 43    Date for PT Re-Evaluation 04/09/22    Authorization Type MEDICARE PART B reporting period from 01/15/2022    Progress Note Due on Visit 30    PT Start Time 1820    PT Stop Time 1900    PT Time Calculation (min) 40 min    Equipment Utilized During Treatment Gait belt    Activity Tolerance Patient tolerated treatment well    Behavior During Therapy WFL for tasks assessed/performed               Past Medical History:  Diagnosis Date   Bowel trouble    urgency   Medical history non-contributory    Urinary urgency    Past Surgical History:  Procedure Laterality Date   BACK SURGERY  2010   CIRCUMCISION     LUMBAR LAMINECTOMY/DECOMPRESSION MICRODISCECTOMY  07/20/2011   Procedure: LUMBAR LAMINECTOMY/DECOMPRESSION MICRODISCECTOMY;  Surgeon: Winfield Cunas;  Location: [facility];  Service: Neurosurgery;  Laterality: N/A;  right thoracotomy with thoracic eight-nine discectomy and fusion   LUMBAR LAMINECTOMY/DECOMPRESSION MICRODISCECTOMY Right 10/06/2021   Procedure: Right Lumbar Four-Five Microdiscectomy, Right Lumbar Five-Sacal One Foraminotomy;  Surgeon: Ashok Pall, MD;  Location: [facility];  Service: Neurosurgery;  Laterality: Right;  3C/RM 21   THORACIC DISCECTOMY  07/16/2012   Procedure: THORACIC DISCECTOMY;  Surgeon: Winfield Cunas, MD;  Location: [facility];  Service: Neurosurgery;  Laterality: Right;  RIGHT Thoracic seven-eight  thoracic diskectomy via thoracotomy by dr Cyndia Bent   THORACIC DISCECTOMY N/A 12/15/2014   Procedure: THORACIC SEVEN TO THORACIC NINE Laminectomy ;  Surgeon: Ashok Pall, MD;  Location: [facility];  Service: Neurosurgery;  Laterality: N/A;   THORACIC DISCECTOMY N/A 06/06/2017   Procedure: LAMINECTOMY THORACIC NINE-TEN;   Surgeon: Ashok Pall, MD;  Location: [facility];  Service: Neurosurgery;  Laterality: N/A;  LAMINECTOMY THORACIC NINE-TEN   THORACOTOMY  07/20/2011   Procedure: THORACOTOMY OPEN FOR SPINE SURGERY;  Surgeon: Pierre Bali, MD;  Location: [facility];  Service: Vascular;  Laterality: N/A;   THORACOTOMY  07/16/2012   Procedure: THORACOTOMY OPEN FOR SPINE SURGERY;  Surgeon: Gaye Pollack, MD;  Location: [facility];  Service: Thoracic;  Laterality: N/A;   Patient Active Problem List   Diagnosis Date Noted   HNP (herniated nucleus pulposus), lumbar 10/06/2021   Hand pain 06/28/2021   Lumbar facet arthropathy 01/11/2021   Thoracic spondylosis with myelopathy 01/11/2021   Thoracic spinal stenosis 06/06/2017   Stenosis, spinal, thoracic 12/15/2014   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 09/22/2014   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/2011    PCP: [facility]  REFERRING PROVIDER: Ashok Pall, MD  REFERRING DIAG: 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  PERTINENT HISTORY: Patient is a 41 y.o. male who presents to outpatient physical therapy with  a referral for medical diagnosis  intervertebral disc disorder of thoracic region with myelopathy. This patient's chief complaints consist of low back pain, R LE pain/paresthesia/weakness, L LE weakness s/p right L4-5 laminotomy, microdiscectomy on 10/06/2021 leading to the following functional deficits: difficulty with bed mobility, transfers,  driving, household and community ambulation, preventing falls, floor transfers, playing with his daughter, stairs, social participation and socializing with friends. 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    Patient denies hx of cancer, stroke, seizures, lung problems, heart problems, diabetes, unexplained weight loss, and osteoporosis. Patient reports he has difficulty with making it to the bathroom on time when he gets bowel or bladder urges and has stumbling. He has history of thoracic and lumbar surgeries.  PRECAUTIONS: Other: no lifting > 10 lbs, no bending/twisting at waist (at least until 01/13/2022). Don't lay on stomach.    PATIENT GOALS: get better and get off the walker to walk with [facility], play with his daughter (15 months), get his strength back, be able to get up and down from floor.  SUBJECTIVE: Pt reports 3/10 NPS in low back and pain in right posterior thigh. He had one day where his right leg felt like it was going to fall off. Had soreness after last session in thighs, calves, and lower back. He arrives on rollator.    PAIN:  Are you having pain? 3/10 Location: surgical site at low back right posterior thigh.    OBJECTIVE   TODAY'S TREATMENT  Therapeutic exercise: to centralize symptoms and improve ROM, strength, muscular endurance, and activity tolerance required for successful completion of functional activities.  - NuStep level 6 using bilateral upper and lower extremities. Seat setting 11. For improved extremity mobility, muscular endurance, and activity tolerance; and to induce the analgesic effect of aerobic exercise,  stimulate improved joint nutrition, and prepare body structures and systems for following interventions. 5 min. Average SPM = 78. - Alternating 6" step ups with BUE support on rails. 3x10/LE.  Neuromuscular Re-education: to improve, balance, postural strength, muscle activation patterns, and stabilization strength required for functional activities: - standing ball toss: forwards 1x20 with light pink theraball, forwards ball toss with 2kg med ball 1x10, forwards with 3kg ball toss 1x10, 2kg med ball lateral toss 3x10 each side. SBA  - Side steps with (3 yellow) hurdles and S UE support: 1x5/direction step to pattern. Reports easy-medium. SBA throughout. SBA. - Forward step overs with step to with 3 yellow hurdles with SUE support. 1x5, 1x1/direction/foot. SBA - Standing palloff press with BlueTB, 3x10/side standing on airex pad, CGA. - patient used SPC to ambulate around clinic between exercises.   Pt required multimodal cuing for proper technique and to facilitate improved neuromuscular control, strength, range of motion, and functional ability resulting in improved performance and form.   HOME EXERCISE PROGRAM:  Access Code: [email or web address] URL: [email or web address] Date: 11/13/2021 Prepared by: Rosita Kea  Exercises - Sit to Stand  - 1 x weekly - 2-3 sets - 5-10 reps - Seated Isometric Hip Adduction with Ball  - 1 x daily - 2-3 sets - 5-10 reps - 5 seconds hold - Seated Toe Raise  - 1 x daily - 2-3 sets - 10 reps -  Seated Single Arm Shoulder Row with Anchored Resistance  - 1 x daily - 3 sets - 10 reps  PATIENT EDUCATION:  Education details: Form/technique with exercise.  Person educated: Patient Education method: Explanation, Demonstration, Tactile cues, and Verbal cues Education comprehension: verbalized understanding, returned demonstration, verbal cues required, tactile cues required, and needs further education   ASSESSMENT:  CLINICAL IMPRESSION: Patient tolerated  treatment well and continued working on standing functional exercises and balance. He was able to progress to more ballistic lumbar and balance stabilization with ball toss exercise and increase the amount of hurdles he was able to step over. He did continue to need periodic standing rests or a seated rest to help relax his back when it started to feel tight or when his R LE suddenly started to feel weak and have worse motor control, but this improved with rest. Patient continues to have difficulty with functional mobility and balance that is limited by impairments including imbalance, poor motor control of B LE, pain, decreased muscular endurance and coordination, etc. Patient would benefit from continued management of limiting condition by skilled physical therapist to address remaining impairments and functional limitations to work towards stated goals and return to PLOF or maximal functional independence.  OBJECTIVE IMPAIRMENTS Abnormal gait, decreased activity tolerance, decreased balance, decreased coordination, decreased endurance, decreased knowledge of use of DME, decreased mobility, difficulty walking, decreased ROM, decreased strength, impaired perceived functional ability, impaired flexibility, impaired sensation, impaired tone, impaired UE functional use, postural dysfunction, obesity, and pain.   ACTIVITY LIMITATIONS cleaning, community activity, driving, occupation, laundry, shopping, and basic mobility such as bed mobility, transfers, driving, household and community ambulation, preventing falls, floor transfers, playing with his daughter, stairs, social participation and socializing with friends .   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    REHAB POTENTIAL: Good  CLINICAL DECISION MAKING: Evolving/moderate complexity  EVALUATION COMPLEXITY: Moderate   GOALS: Goals reviewed with patient? No  SHORT TERM GOALS: Target date: 11/15/2021  Be independent with initial home exercise program for self-management of symptoms. Baseline: initial HEP to be provided at visit 2 as appropriate (11/01/2021);  Goal status: MET  LONG TERM GOALS: Target date: 01/24/2022; Updated to 04/09/2022 on 01/15/2022.  Be independent with a long-term home exercise program for self-management of symptoms.  Baseline: Initial HEP to be provided at visit 2 as appropriate (11/01/2021); patient currently participating well (12/06/2021; 01/15/2022);  Goal status: IN PROGRESS  2.  Demonstrate improved FOTO to equal or greater than 42 by visit #14 to demonstrate improvement in overall condition and self-reported functional ability.  Baseline: 26 (11/01/2021); 45 at visit #10 (12/06/2021); 53 at visit# 20 (01/15/2022);  Goal status: ACHIEVED 12/06/2021  3.  Patient will complete 5 Time Sit To Stand Test in equal or less than 15 seconds for 18.5 inch surface with no UE support to demonstrate improved ability to complete transfers and decrease fall risk.  Baseline: 45 second from 18.5 inch plinth with R UE support on plinth and L UE support on locked rollator and B UE support on rollator in standing. Multiple attempts needed on some reps (  11/01/2021); 21 second from 18.5 inch plinth with B UE support on plinth. Walker positioned in front of him for safety but did not need it. (12/06/2021); 21 second from 18.5 inch plinth with no UE support. Walker positioned in front of him for safety but did not need it (01/15/2022);  Goal  status: IN PROGRESS  4.  Patient will ambulate equal or greater than 1200 feet on 6 Minute Walk Test with [facility]  to demonstrate improved household and community mobility as well as community and social participation.  Baseline: to be tested visit 2 as appropriate (11/01/2021); 800 feet with rollator. Low back pain increased to 4-5/10 and had pain down R LE to calf (11/13/2021); 943 feet with SPC and SBA (two little standing breaks to stretch back due to tightness at low back, increased sharper pain in back and B legs, 12/06/2021);  948 feet with SPC (01/09/2022); 1045 feet with SPC no rests  but catches toes on floor at times (01/15/2022);  Goal status: IN PROGRESS  5.  Complete community, work and/or recreational activities without limitation due to current condition.  Baseline: difficulty with bed mobility, transfers, driving, household and community ambulation, preventing falls, floor transfers, playing with his daughter, stairs, social participation and socializing with friends (11/01/2021); walking is getting easier, transfers are easier, doesn't have to use his rollator as much, stairs are still troublesome, has not tried floor transfer, still restricted socially and continues to have significant limitations with functional activities and mobility (12/06/2021); walking and transfers are easier and is using SPC at home instead of rollator, continues to be restricted socially and have significant limitations with functional activities, basic mobility, and balance.  Goal status: IN PROGRESS     PLAN: PT FREQUENCY: 2x/week  PT DURATION: 12 weeks  PLANNED INTERVENTIONS: Therapeutic exercises, Therapeutic activity, Neuromuscular re-education, Balance training, Gait training, Patient/Family education, Joint mobilization, Stair training, DME instructions, Dry Needling, and Electrical stimulation.  PLAN FOR NEXT SESSION: Update HEP as tolerated. Progressive LE, core, functional strengthening and balance  exercises as tolerated and appropriate.    Everlean Alstrom. Graylon Good, PT, DPT 02/20/22, 7:19 PM  [facility] [address] [place], [place] [address] P: [phone] I F: [phone]

## 2022-02-22 ENCOUNTER — Encounter: Payer: Self-pay | Admitting: Physical Therapy

## 2022-02-22 ENCOUNTER — Ambulatory Visit: Payer: Medicare Other | Admitting: Physical Therapy

## 2022-02-22 DIAGNOSIS — M5459 Other low back pain: Secondary | ICD-10-CM | POA: Diagnosis not present

## 2022-02-22 DIAGNOSIS — M546 Pain in thoracic spine: Secondary | ICD-10-CM

## 2022-02-22 DIAGNOSIS — R296 Repeated falls: Secondary | ICD-10-CM

## 2022-02-22 DIAGNOSIS — R262 Difficulty in walking, not elsewhere classified: Secondary | ICD-10-CM

## 2022-02-22 DIAGNOSIS — M6281 Muscle weakness (generalized): Secondary | ICD-10-CM

## 2022-02-22 DIAGNOSIS — R2689 Other abnormalities of gait and mobility: Secondary | ICD-10-CM

## 2022-02-22 NOTE — Therapy (Signed)
OUTPATIENT PHYSICAL THERAPY TREATMENT NOTE   Patient Name: Travis Fitzpatrick MRN: [id number] DOB:03-26-1981, 41 y.o., male Today's Date: 02/22/22     PT End of Session - 02/22/22 1819     Visit Number 29    Number of Visits 43    Date for PT Re-Evaluation 04/09/22    Authorization Type MEDICARE PART B reporting period from 01/15/2022    Progress Note Due on Visit 30    PT Start Time 1817    PT Stop Time 1857    PT Time Calculation (min) 40 min    Equipment Utilized During Treatment Gait belt    Activity Tolerance Patient tolerated treatment well    Behavior During Therapy WFL for tasks assessed/performed                Past Medical History:  Diagnosis Date   Bowel trouble    urgency   Medical history non-contributory    Urinary urgency    Past Surgical History:  Procedure Laterality Date   BACK SURGERY  2010   CIRCUMCISION     LUMBAR LAMINECTOMY/DECOMPRESSION MICRODISCECTOMY  07/20/2011   Procedure: LUMBAR LAMINECTOMY/DECOMPRESSION MICRODISCECTOMY;  Surgeon: Winfield Cunas;  Location: [facility];  Service: Neurosurgery;  Laterality: N/A;  right thoracotomy with thoracic eight-nine discectomy and fusion   LUMBAR LAMINECTOMY/DECOMPRESSION MICRODISCECTOMY Right 10/06/2021   Procedure: Right Lumbar Four-Five Microdiscectomy, Right Lumbar Five-Sacal One Foraminotomy;  Surgeon: Ashok Pall, MD;  Location: [facility];  Service: Neurosurgery;  Laterality: Right;  3C/RM 21   THORACIC DISCECTOMY  07/16/2012   Procedure: THORACIC DISCECTOMY;  Surgeon: Winfield Cunas, MD;  Location: [facility];  Service: Neurosurgery;  Laterality: Right;  RIGHT Thoracic seven-eight  thoracic diskectomy via thoracotomy by dr Cyndia Bent   THORACIC DISCECTOMY N/A 12/15/2014   Procedure: THORACIC SEVEN TO THORACIC NINE Laminectomy ;  Surgeon: Ashok Pall, MD;  Location: [facility];  Service: Neurosurgery;  Laterality: N/A;   THORACIC DISCECTOMY N/A 06/06/2017   Procedure: LAMINECTOMY THORACIC NINE-TEN;   Surgeon: Ashok Pall, MD;  Location: [facility];  Service: Neurosurgery;  Laterality: N/A;  LAMINECTOMY THORACIC NINE-TEN   THORACOTOMY  07/20/2011   Procedure: THORACOTOMY OPEN FOR SPINE SURGERY;  Surgeon: Pierre Bali, MD;  Location: [facility];  Service: Vascular;  Laterality: N/A;   THORACOTOMY  07/16/2012   Procedure: THORACOTOMY OPEN FOR SPINE SURGERY;  Surgeon: Gaye Pollack, MD;  Location: [facility];  Service: Thoracic;  Laterality: N/A;   Patient Active Problem List   Diagnosis Date Noted   HNP (herniated nucleus pulposus), lumbar 10/06/2021   Hand pain 06/28/2021   Lumbar facet arthropathy 01/11/2021   Thoracic spondylosis with myelopathy 01/11/2021   Thoracic spinal stenosis 06/06/2017   Stenosis, spinal, thoracic 12/15/2014   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 09/22/2014   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/2011    PCP: [facility]  REFERRING PROVIDER: Ashok Pall, MD  REFERRING DIAG: 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  PERTINENT HISTORY: Patient is a 41 y.o. male who presents to outpatient physical therapy  with a referral for medical diagnosis  intervertebral disc disorder of thoracic region with myelopathy. This patient's chief complaints consist of low back pain, R LE pain/paresthesia/weakness, L LE weakness s/p right L4-5 laminotomy, microdiscectomy on 10/06/2021 leading to the following functional deficits: difficulty with bed mobility, transfers,  driving, household and community ambulation, preventing falls, floor transfers, playing with his daughter, stairs, social participation and socializing with friends. 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    Patient denies hx of cancer, stroke, seizures, lung problems, heart problems, diabetes, unexplained weight loss, and osteoporosis. Patient reports he has difficulty with making it to the bathroom on time when he gets bowel or bladder urges and has stumbling. He has history of thoracic and lumbar surgeries.  PRECAUTIONS: Other: no lifting > 10 lbs, no bending/twisting at waist (at least until 01/13/2022). Don't lay on stomach.    PATIENT GOALS: get better and get off the walker to walk with [facility], play with his daughter (15 months), get his strength back, be able to get up and down from floor.  SUBJECTIVE: Patient reports he is feeling well and was sore in his abs, back, and legs after last PT session. He currently has pain low back and right posterior hip and thigh above the knee rated 2/10. Patient arrives on rollator. States he continues to have right groin pain at times.    PAIN:  Are you having pain? 2/10 Location: surgical site at low back right posterior thigh.    OBJECTIVE   TODAY'S TREATMENT  Therapeutic exercise: to centralize symptoms and improve ROM, strength, muscular endurance, and activity tolerance required for successful completion of functional activities.  - NuStep level 6 using bilateral upper and lower extremities. Seat setting 11. For improved extremity mobility, muscular endurance, and activity tolerance; and to induce  the analgesic effect of aerobic exercise, stimulate improved joint nutrition, and prepare body structures and systems for following interventions. 5 min. Average SPM = 78. - 8" step ups with ipsilateral S UE support on TM rail. 3x10/LE. - sit <> stand with TRX from 17 inch chair. 1x10, needed minA to prevent shift out of chair towards forwards and right.   Neuromuscular Re-education: to improve, balance, postural strength, muscle activation patterns, and stabilization strength required for functional activities: - standing ball toss: forwards 1x20 with 3kg med balll, lateral ball toss with 3kg med ball 1x10 each side, lateral toss with 3kg med ball 1x10 and 1x20 while standing on airex pad, CGA with chair in front and behind.   - Side steps with (3 yellow) hurdles and S UE support: 1x2/direction step to pattern. Reports easy-medium. SBA throughout. SBA. - Forward step overs with step to with 3 yellow hurdles with SUE support. 1x2/direction/foot. SBA - patient used no AD to ambulate around clinic between exercises with no stumbles but some unsteadiness.   Pt required multimodal cuing for proper technique and to facilitate improved neuromuscular control, strength, range of motion, and functional ability resulting in improved performance and form.   HOME EXERCISE PROGRAM:  Access Code: [email or web address] URL: [email or web address] Date: 11/13/2021 Prepared by: Rosita Kea  Exercises - Sit to Stand  - 1 x weekly - 2-3 sets - 5-10 reps -  Seated Isometric Hip Adduction with Ball  - 1 x daily - 2-3 sets - 5-10 reps - 5 seconds hold - Seated Toe Raise  - 1 x daily - 2-3 sets - 10 reps - Seated Single Arm Shoulder Row with Anchored Resistance  - 1 x daily - 3 sets - 10 reps  PATIENT EDUCATION:  Education details: Form/technique with exercise.  Person educated: Patient Education method: Explanation, Demonstration, Tactile cues, and Verbal cues Education comprehension: verbalized understanding,  returned demonstration, verbal cues required, tactile cues required, and needs further education   ASSESSMENT:  CLINICAL IMPRESSION: Patient tolerated treatment well with no increase in pain but did have increased fatigue in legs and needed breaks to recover motor control in B LE (R > L). Patient was able to advance to step up on higher step with less UE support and increased weight and less stable surface with med ball toss. Patient found TRX STS very challenging and needed min A to prevent slide forwards and right off chair when R leg became too fatigued to continue. Patient continues to have limited motor control, LE strength and muscular endurance which limits his ability to ambulate and complete functional activities. Patient would benefit from continued management of limiting condition by skilled physical therapist to address remaining impairments and functional limitations to work towards stated goals and return to PLOF or maximal functional independence.   OBJECTIVE IMPAIRMENTS Abnormal gait, decreased activity tolerance, decreased balance, decreased coordination, decreased endurance, decreased knowledge of use of DME, decreased mobility, difficulty walking, decreased ROM, decreased strength, impaired perceived functional ability, impaired flexibility, impaired sensation, impaired tone, impaired UE functional use, postural dysfunction, obesity, and pain.   ACTIVITY LIMITATIONS cleaning, community activity, driving, occupation, laundry, shopping, and basic mobility such as bed mobility, transfers, driving, household and community ambulation, preventing falls, floor transfers, playing with his daughter, stairs, social participation and socializing with friends .   PERSONAL FACTORS Fitness, Past/current experiences, Profession, Social background, Time since onset of injury/illness/exacerbation, and 3+ comorbidities: 3 thoracic spine surgeries, thoracic MRI notes "Myelomalacia with severe cord  atrophy from T7 through T9-10 and mild decreased volume the remainder of the thoracic cord, stable" s/p right L4-5 laminotomy, microdiscectomy on 10/06/2021, history of pressure to cauda equina, thoracic disc disease with myelopathy, hand pain, urinary and bowel urgency  are also affecting patient's functional outcome.    REHAB POTENTIAL: Good  CLINICAL DECISION MAKING: Evolving/moderate complexity  EVALUATION COMPLEXITY: Moderate   GOALS: Goals reviewed with patient? No  SHORT TERM GOALS: Target date: 11/15/2021  Be independent with initial home exercise program for self-management of symptoms. Baseline: initial HEP to be provided at visit 2 as appropriate (11/01/2021);  Goal status: MET  LONG TERM GOALS: Target date: 01/24/2022; Updated to 04/09/2022 on 01/15/2022.  Be independent with a long-term home exercise program for self-management of symptoms.  Baseline: Initial HEP to be provided at visit 2 as appropriate (11/01/2021); patient currently participating well (12/06/2021; 01/15/2022);  Goal status: IN PROGRESS  2.  Demonstrate improved FOTO to equal or greater than 42 by visit #14 to demonstrate improvement in overall condition and self-reported functional ability.  Baseline: 26 (11/01/2021); 45 at visit #10 (12/06/2021); 53 at visit# 20 (01/15/2022);  Goal status: ACHIEVED 12/06/2021  3.  Patient will complete 5 Time Sit To Stand Test in equal or less than 15 seconds for 18.5 inch surface with no UE support to demonstrate improved ability to complete transfers and decrease fall risk.  Baseline: 45 second from 18.5 inch plinth  with R UE support on plinth and L UE support on locked rollator and B UE support on rollator in standing. Multiple attempts needed on some reps (11/01/2021); 21 second from 18.5 inch plinth with B UE support on plinth. Walker positioned in front of him for safety but did not need it. (12/06/2021); 21 second from 18.5 inch plinth with no UE support. Walker positioned in front  of him for safety but did not need it (01/15/2022);  Goal status: IN PROGRESS  4.  Patient will ambulate equal or greater than 1200 feet on 6 Minute Walk Test with [facility]  to demonstrate improved household and community mobility as well as community and social participation.  Baseline: to be tested visit 2 as appropriate (11/01/2021); 800 feet with rollator. Low back pain increased to 4-5/10 and had pain down R LE to calf (11/13/2021); 943 feet with SPC and SBA (two little standing breaks to stretch back due to tightness at low back, increased sharper pain in back and B legs, 12/06/2021);  948 feet with SPC (01/09/2022); 1045 feet with SPC no rests  but catches toes on floor at times (01/15/2022);  Goal status: IN PROGRESS  5.  Complete community, work and/or recreational activities without limitation due to current condition.  Baseline: difficulty with bed mobility, transfers, driving, household and community ambulation, preventing falls, floor transfers, playing with his daughter, stairs, social participation and socializing with friends (11/01/2021); walking is getting easier, transfers are easier, doesn't have to use his rollator as much, stairs are still troublesome, has not tried floor transfer, still restricted socially and continues to have significant limitations with functional activities and mobility (12/06/2021); walking and transfers are easier and is using SPC at home instead of rollator, continues to be restricted socially and have significant limitations with functional activities, basic mobility, and balance.  Goal status: IN PROGRESS     PLAN: PT FREQUENCY: 2x/week  PT DURATION: 12 weeks  PLANNED INTERVENTIONS: Therapeutic exercises, Therapeutic activity, Neuromuscular re-education, Balance training, Gait training, Patient/Family education, Joint mobilization, Stair training, DME instructions, Dry Needling, and Electrical stimulation.  PLAN FOR NEXT SESSION: Update HEP as tolerated.  Progressive LE, core, functional strengthening and balance exercises as tolerated and appropriate.    Everlean Alstrom. Graylon Good, PT, DPT 02/22/22, 7:13 PM  [facility] [address] [place], [place] [address] P: [phone] I F: [phone]

## 2022-02-26 ENCOUNTER — Encounter: Payer: Self-pay | Admitting: Physical Therapy

## 2022-02-26 ENCOUNTER — Ambulatory Visit: Payer: Medicare Other | Admitting: Physical Therapy

## 2022-02-26 DIAGNOSIS — M5459 Other low back pain: Secondary | ICD-10-CM | POA: Diagnosis not present

## 2022-02-26 DIAGNOSIS — R2689 Other abnormalities of gait and mobility: Secondary | ICD-10-CM

## 2022-02-26 DIAGNOSIS — M546 Pain in thoracic spine: Secondary | ICD-10-CM

## 2022-02-26 DIAGNOSIS — M6281 Muscle weakness (generalized): Secondary | ICD-10-CM

## 2022-02-26 DIAGNOSIS — R296 Repeated falls: Secondary | ICD-10-CM

## 2022-02-26 DIAGNOSIS — R262 Difficulty in walking, not elsewhere classified: Secondary | ICD-10-CM

## 2022-02-26 NOTE — Therapy (Signed)
OUTPATIENT PHYSICAL THERAPY TREATMENT / PROGRESS NOTE Dates of reporting from 01/15/2022 to 02/26/2022   Patient Name: Travis Fitzpatrick MRN: [id number] DOB:03-26-1981, 41 y.o., male Today's Date: 02/26/22     PT End of Session - 02/26/22 1907     Visit Number 30    Number of Visits 43    Date for PT Re-Evaluation 04/09/22    Authorization Type MEDICARE PART B reporting period from 01/15/2022    Progress Note Due on Visit 37    PT Start Time 1907    PT Stop Time 1945    PT Time Calculation (min) 38 min    Equipment Utilized During Treatment Gait belt    Activity Tolerance Patient tolerated treatment well    Behavior During Therapy WFL for tasks assessed/performed                Past Medical History:  Diagnosis Date   Bowel trouble    urgency   Medical history non-contributory    Urinary urgency    Past Surgical History:  Procedure Laterality Date   BACK SURGERY  2010   CIRCUMCISION     LUMBAR LAMINECTOMY/DECOMPRESSION MICRODISCECTOMY  07/20/2011   Procedure: LUMBAR LAMINECTOMY/DECOMPRESSION MICRODISCECTOMY;  Surgeon: Winfield Cunas;  Location: [place] [facility];  Service: Neurosurgery;  Laterality: N/A;  right thoracotomy with thoracic eight-nine discectomy and fusion   LUMBAR LAMINECTOMY/DECOMPRESSION MICRODISCECTOMY Right 10/06/2021   Procedure: Right Lumbar Four-Five Microdiscectomy, Right Lumbar Five-Sacal One Foraminotomy;  Surgeon: Ashok Pall, MD;  Location: [place];  Service: Neurosurgery;  Laterality: Right;  3C/RM 21   THORACIC DISCECTOMY  07/16/2012   Procedure: THORACIC DISCECTOMY;  Surgeon: Winfield Cunas, MD;  Location: [place] [facility];  Service: Neurosurgery;  Laterality: Right;  RIGHT Thoracic seven-eight  thoracic diskectomy via thoracotomy by dr Cyndia Bent   THORACIC DISCECTOMY N/A 12/15/2014   Procedure: THORACIC SEVEN TO THORACIC NINE Laminectomy ;  Surgeon: Ashok Pall, MD;  Location: [place] [facility];  Service: Neurosurgery;  Laterality: N/A;   THORACIC DISCECTOMY N/A  06/06/2017   Procedure: LAMINECTOMY THORACIC NINE-TEN;  Surgeon: Ashok Pall, MD;  Location: [place];  Service: Neurosurgery;  Laterality: N/A;  LAMINECTOMY THORACIC NINE-TEN   THORACOTOMY  07/20/2011   Procedure: THORACOTOMY OPEN FOR SPINE SURGERY;  Surgeon: Pierre Bali, MD;  Location: [facility];  Service: Vascular;  Laterality: N/A;   THORACOTOMY  07/16/2012   Procedure: THORACOTOMY OPEN FOR SPINE SURGERY;  Surgeon: Gaye Pollack, MD;  Location: [place] [facility];  Service: Thoracic;  Laterality: N/A;   Patient Active Problem List   Diagnosis Date Noted   HNP (herniated nucleus pulposus), lumbar 10/06/2021   Hand pain 06/28/2021   Lumbar facet arthropathy 01/11/2021   Thoracic spondylosis with myelopathy 01/11/2021   Thoracic spinal stenosis 06/06/2017   Stenosis, spinal, thoracic 12/15/2014   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 09/22/2014   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/2011    PCP: [facility] [place]  REFERRING PROVIDER: Ashok Pall, MD  REFERRING DIAG: 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  PERTINENT HISTORY: Patient is a  41 y.o. 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 This patient's chief complaints consist of low back pain, R LE pain/paresthesia/weakness, L LE weakness s/p right L4-5 laminotomy, microdiscectomy on 10/06/2021 leading to the following  functional deficits: difficulty with bed mobility, transfers, driving, household and community ambulation, preventing falls, floor transfers, playing with his daughter, stairs, social participation and socializing with friends. 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    Patient denies hx of cancer, stroke, seizures, lung problems, heart problems, diabetes, unexplained weight loss, and osteoporosis. Patient reports he has difficulty with making it to the bathroom on time when he gets bowel or bladder urges and has stumbling. He has history of thoracic and lumbar surgeries.  PRECAUTIONS: Other: no lifting > 10 lbs, no bending/twisting at waist (at least until 01/13/2022). Don't lay on stomach.    PATIENT GOALS: get better and get off the walker to walk with [facility], play with his daughter (15 months), get his strength back, be able to get up and down from floor.  SUBJECTIVE: Patient arrives on rollator. He states he has 2/10 pain in his low back and right posterior thigh. He continues to have R lower abdominal pain that he associates with needing to have BM. He states today is  "not a good day" because he was having more pain in his back and leg. He continues to feel he is benefiting from PT.     PAIN:  Are you having pain? 2/10 Location: surgical site at low back right posterior thigh.    OBJECTIVE   SELF-REPORTED FUNCTION FOTO score: 50/100 (lumbar spine questionnaire)   FUNCTIONAL TESTS:  5 times sit to stand: 15 second from 18.5 inch plinth with no UE support on plinth. Walker positioned in front of him for safety but did not need it.   6  Minute Walk Test: 936 feet with SBA and mostly carrying cane for first 4 min of test. Scuffing feet on floor slightly. No stumbles. Sharp pain developed around 4 min in right glute/hip region worse on initial contact that remained after seated rest from R glute to anterior thigh to knee and in toes.      TODAY'S TREATMENT  Therapeutic exercise: to centralize symptoms and improve ROM, strength, muscular endurance, and activity tolerance required for successful completion of functional activities.  - ambulation around clinic for distance in 6 min using SPC and SBA with patient carrying SPC most of the first 4 min (see 6MWT above).  - seated sciatic nerve glide 1x15 each side (no improvement).  - sit <> stand 3x5 for speed from 19.5/18.5/18.5 inch plinth (see 5TSTS test above).  Neuromuscular Re-education: to improve, balance, postural strength, muscle activation patterns, and stabilization strength required for functional activities: - standing ball toss: forwards 3x10 with 2kg med balll, lateral ball toss with 2kg med ball 3x10 each side, CGA with one LOB backwards needing MinA to recover.    Pt required multimodal cuing for proper technique and to facilitate improved neuromuscular control, strength, range of motion, and functional ability resulting in improved performance and form.   HOME EXERCISE PROGRAM:  Access  Code: [email or web address] URL: [email or web address] Date: 11/13/2021 Prepared by: Rosita Kea  Exercises - Sit to Stand  - 1 x weekly - 2-3 sets - 5-10 reps - Seated Isometric Hip Adduction with Ball  - 1 x daily - 2-3 sets - 5-10 reps - 5 seconds hold - Seated Toe Raise  - 1 x daily - 2-3 sets - 10 reps - Seated Single Arm Shoulder Row with Anchored Resistance  - 1 x daily - 3 sets - 10 reps  PATIENT EDUCATION:  Education details: Form/technique with exercise.  Person educated: Patient Education method: Explanation, Demonstration, Tactile cues, and Verbal cues Education  comprehension: verbalized understanding, returned demonstration, verbal cues required, tactile cues required, and needs further education   ASSESSMENT:  CLINICAL IMPRESSION: Patient has attended 30 physical therapy sessions since starting this episode of care on 11/01/2021. Patient has made gradual progress towards goals with difficulty progressing at times due to irritability of symptoms in B LE (R > L ) including weakness, decreased motor control and pain as well as back pain. Today, patient demonstrates improved standing balance during ambulation and was able to complete approximately 2/3 of 6 Minute Walk Test without using SPC on floor (he was carrying cane) and improved his 5 Times Sit To Stand test to 15 seconds on 18.5 inch surface with no UE support. Patient did have increased R leg pain and paresthesia with onset about 4 min into 6MWT that lingered over the rest of the session and needed multiple attempts to complete 5TSTS test due to limitations in balance, motor control, pain, LE strength and endurance. Patient continues to have limited quality of life and activity tolerance due to his impairments and functional limitations. He also continues to report bowel and bladder dysfunction related to his back pain and right lower abdominal pain that is worse when his back pain and function are worse. He continues to show progress with PT and would benefit from continuing his current plan of care. Patient would benefit from continued management of limiting condition by skilled physical therapist to address remaining impairments and functional limitations to work towards stated goals and return to PLOF or maximal functional independence.   OBJECTIVE IMPAIRMENTS Abnormal gait, decreased activity tolerance, decreased balance, decreased coordination, decreased endurance, decreased knowledge of use of DME, decreased mobility, difficulty walking, decreased ROM, decreased strength, impaired perceived functional  ability, impaired flexibility, impaired sensation, impaired tone, impaired UE functional use, postural dysfunction, obesity, and pain.   ACTIVITY LIMITATIONS cleaning, community activity, driving, occupation, laundry, shopping, and basic mobility such as bed mobility, transfers, driving, household and community ambulation, preventing falls, floor transfers, playing with his daughter, stairs, social participation and socializing with friends .   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    REHAB POTENTIAL: Good  CLINICAL DECISION MAKING: Evolving/moderate complexity  EVALUATION COMPLEXITY: Moderate   GOALS: Goals reviewed with patient? No  SHORT TERM GOALS: Target date: 11/15/2021  Be independent with initial home exercise program for self-management of symptoms. Baseline: initial HEP to be provided at visit 2 as appropriate (11/01/2021);  Goal status: MET  LONG TERM GOALS: Target  date: 01/24/2022; Updated to 04/09/2022 on 01/15/2022.  Be independent with a long-term home exercise program for self-management of symptoms.  Baseline: Initial HEP to be provided at visit 2 as appropriate (11/01/2021); patient currently participating well (12/06/2021; 01/15/2022; 02/26/2022);  Goal status: IN PROGRESS  2.  Demonstrate improved FOTO to equal or greater than 42 by visit #14 to demonstrate improvement in overall condition and self-reported functional ability.  Baseline: 26 (11/01/2021); 45 at visit #10  (12/06/2021); 53 at visit# 20 (01/15/2022); 50 at visit #30 (02/26/2022);  Goal status: ACHIEVED 12/06/2021  3.  Patient will complete 5 Time Sit To Stand Test in equal or less than 15 seconds for 18.5 inch surface with no UE support to demonstrate improved ability to complete transfers and decrease fall risk.  Baseline: 45 second from 18.5 inch plinth with R UE support on plinth and L UE support on locked rollator and B UE support on rollator in standing. Multiple attempts needed on some reps (11/01/2021); 21 second from 18.5 inch plinth with B UE support on plinth. Walker positioned in front of him for safety but did not need it. (12/06/2021); 21 second from 18.5 inch plinth with no UE support. Walker positioned in front of him for safety but did not need it (01/15/2022); 15 seconds from 18.5 inch plinth with no UE support. Walker positioned in front of him for safety but did not need it (02/26/2022); Goal status: IN PROGRESS  4.  Patient will ambulate equal or greater than 1200 feet on 6 Minute Walk Test with [facility]  to demonstrate improved household and community mobility as well as community and social participation.  Baseline: to be tested visit 2 as appropriate (11/01/2021); 800 feet with rollator. Low back pain increased to 4-5/10 and had pain down R LE to calf (11/13/2021); 943 feet with SPC and SBA (two little standing breaks to stretch back due to tightness at low back, increased sharper pain in back and B legs, 12/06/2021);  948 feet with SPC (01/09/2022); 1045 feet with SPC no rests  but catches toes on floor at times (01/15/2022); 936 feet with SBA and mostly carrying cane for first 4 min of test. Scuffing feet on floor slightly. No stumbles. Sharp pain developed around 4 min in right glute/hip region worse on initial contact that remained after seated rest from R glute to anterior thigh to knee and in toes (02/26/2022);  Goal status: IN PROGRESS  5.  Complete community, work and/or recreational activities  without limitation due to current condition.  Baseline: difficulty with bed mobility, transfers, driving, household and community ambulation, preventing falls, floor transfers, playing with his daughter, stairs, social participation and socializing with friends (11/01/2021); walking is getting easier, transfers are easier, doesn't have to use his rollator as much, stairs are still troublesome, has not tried floor transfer, still restricted socially and continues to have significant limitations with functional activities and mobility (12/06/2021); walking and transfers are easier and is using [place] at home instead of rollator, continues to be restricted socially and have significant limitations with functional activities, basic mobility, and balance (01/15/2022); driving is better and he is able to move his foot instead of his whole leg, still has not been able to get to the floor and back up, it is getting a little easier to do things around the house, still hard to lift heavy boxes and lift his daughter, he can stand a little longer to do the dishes, vacuuming the floor is less difficult than before, he  continues to have limitations in socialization and community mobility, still is concerned about falling, continues to have urgency with bowel and bladder (02/26/2022);  Goal status: IN PROGRESS     PLAN: PT FREQUENCY: 2x/week  PT DURATION: 12 weeks  PLANNED INTERVENTIONS: Therapeutic exercises, Therapeutic activity, Neuromuscular re-education, Balance training, Gait training, Patient/Family education, Joint mobilization, Stair training, DME instructions, Dry Needling, and Electrical stimulation.  PLAN FOR NEXT SESSION: Update HEP as tolerated. Progressive LE, core, functional strengthening and balance exercises as tolerated and appropriate.    Everlean Alstrom. Graylon Good, PT, DPT 02/26/22, 8:02 PM  [facility] [address] [place], [place] [address] P: [phone] I F:  [phone]

## 2022-02-28 ENCOUNTER — Ambulatory Visit: Payer: Medicare Other | Admitting: Physical Therapy

## 2022-02-28 ENCOUNTER — Encounter: Payer: Self-pay | Admitting: Physical Therapy

## 2022-02-28 DIAGNOSIS — M6281 Muscle weakness (generalized): Secondary | ICD-10-CM

## 2022-02-28 DIAGNOSIS — M5459 Other low back pain: Secondary | ICD-10-CM

## 2022-02-28 DIAGNOSIS — M546 Pain in thoracic spine: Secondary | ICD-10-CM

## 2022-02-28 DIAGNOSIS — R296 Repeated falls: Secondary | ICD-10-CM

## 2022-02-28 DIAGNOSIS — R2689 Other abnormalities of gait and mobility: Secondary | ICD-10-CM

## 2022-02-28 DIAGNOSIS — R262 Difficulty in walking, not elsewhere classified: Secondary | ICD-10-CM

## 2022-02-28 NOTE — Therapy (Signed)
OUTPATIENT PHYSICAL THERAPY TREATMENT NOTE   Patient Name: Travis Fitzpatrick MRN: [id number] DOB:11/20/1980, 41 y.o., male Today's Date: 02/28/22     PT End of Session - 02/28/22 1912     Visit Number 31    Number of Visits 43    Date for PT Re-Evaluation 04/09/22    Authorization Type MEDICARE PART B reporting period from 01/15/2022    Progress Note Due on Visit 35    PT Start Time 1905    PT Stop Time 1945    PT Time Calculation (min) 40 min    Equipment Utilized During Treatment Gait belt    Activity Tolerance Patient tolerated treatment well    Behavior During Therapy WFL for tasks assessed/performed                 Past Medical History:  Diagnosis Date   Bowel trouble    urgency   Medical history non-contributory    Urinary urgency    Past Surgical History:  Procedure Laterality Date   BACK SURGERY  2010   CIRCUMCISION     LUMBAR LAMINECTOMY/DECOMPRESSION MICRODISCECTOMY  07/20/2011   Procedure: LUMBAR LAMINECTOMY/DECOMPRESSION MICRODISCECTOMY;  Surgeon: Winfield Cunas;  Location: [facility];  Service: Neurosurgery;  Laterality: N/A;  right thoracotomy with thoracic eight-nine discectomy and fusion   LUMBAR LAMINECTOMY/DECOMPRESSION MICRODISCECTOMY Right 10/06/2021   Procedure: Right Lumbar Four-Five Microdiscectomy, Right Lumbar Five-Sacal One Foraminotomy;  Surgeon: Ashok Pall, MD;  Location: [facility];  Service: Neurosurgery;  Laterality: Right;  3C/RM 21   THORACIC DISCECTOMY  07/16/2012   Procedure: THORACIC DISCECTOMY;  Surgeon: Winfield Cunas, MD;  Location: [place] [facility];  Service: Neurosurgery;  Laterality: Right;  RIGHT Thoracic seven-eight  thoracic diskectomy via thoracotomy by dr Cyndia Bent   THORACIC DISCECTOMY N/A 12/15/2014   Procedure: THORACIC SEVEN TO THORACIC NINE Laminectomy ;  Surgeon: Ashok Pall, MD;  Location: [place] [facility];  Service: Neurosurgery;  Laterality: N/A;   THORACIC DISCECTOMY N/A 06/06/2017   Procedure: LAMINECTOMY THORACIC NINE-TEN;   Surgeon: Ashok Pall, MD;  Location: [place];  Service: Neurosurgery;  Laterality: N/A;  LAMINECTOMY THORACIC NINE-TEN   THORACOTOMY  07/20/2011   Procedure: THORACOTOMY OPEN FOR SPINE SURGERY;  Surgeon: Pierre Bali, MD;  Location: [facility];  Service: Vascular;  Laterality: N/A;   THORACOTOMY  07/16/2012   Procedure: THORACOTOMY OPEN FOR SPINE SURGERY;  Surgeon: Gaye Pollack, MD;  Location: [facility];  Service: Thoracic;  Laterality: N/A;   Patient Active Problem List   Diagnosis Date Noted   HNP (herniated nucleus pulposus), lumbar 10/06/2021   Hand pain 06/28/2021   Lumbar facet arthropathy 01/11/2021   Thoracic spondylosis with myelopathy 01/11/2021   Thoracic spinal stenosis 06/06/2017   Stenosis, spinal, thoracic 12/15/2014   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 09/22/2014   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/2011    PCP: [facility] [place]  REFERRING PROVIDER: Ashok Pall, MD  REFERRING DIAG: 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  PERTINENT HISTORY: Patient is a 41 y.o. male who presents to outpatient physical  therapy with a referral for medical diagnosis  intervertebral disc disorder of thoracic region with myelopathy. This patient's chief complaints consist of low back pain, R LE pain/paresthesia/weakness, L LE weakness s/p right L4-5 laminotomy, microdiscectomy on 10/06/2021 leading to the following functional deficits: difficulty with bed mobility, transfers,  driving, household and community ambulation, preventing falls, floor transfers, playing with his daughter, stairs, social participation and socializing with friends. 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    Patient denies hx of cancer, stroke, seizures, lung problems, heart problems, diabetes, unexplained weight loss, and osteoporosis. Patient reports he has difficulty with making it to the bathroom on time when he gets bowel or bladder urges and has stumbling. He has history of thoracic and lumbar surgeries.  PRECAUTIONS: Other: no lifting > 10 lbs, no bending/twisting at waist (at least until 01/13/2022). Don't lay on stomach.    PATIENT GOALS: get better and get off the walker to walk with [facility], play with his daughter (15 months), get his strength back, be able to get up and down from floor.  SUBJECTIVE: Patient arrives on rollator. He states he has 2/10 pain in his low back and right posterior thigh. He continues to have R lower abdominal pain that he associates with needing to have BM. He states today is  "not a good day" because he was having more pain in his back and leg. He continues to feel he is benefiting from PT.     PAIN:  Are you having pain? 3/10 Location: surgical site at low back and left posterior thigh to the front of the knee and it comes and goes and right glute and upper posterior thigh.    OBJECTIVE       TODAY'S TREATMENT  Therapeutic exercise: to centralize symptoms and improve ROM, strength, muscular endurance, and activity tolerance required for successful completion of functional activities.  - NuStep level 6  using bilateral upper and lower extremities. Seat setting 11. For improved extremity mobility, muscular endurance, and activity tolerance; and to induce the analgesic effect of aerobic exercise, stimulate improved joint nutrition, and prepare body structures and systems for following interventions. 6 min. Average SPM = 84. - 8" step ups with ipsilateral S UE support on TM rail. 3x10/LE. - sit <> stand with TRX from 17 inch chair. 2x10 (completed another exercise between sets).  - standing leg swings with and without furniture slider under moving leg, 1x20 each side with slider, 1x20 each side with no slider. B UE support.   Neuromuscular Re-education: to improve, balance, postural strength, muscle activation patterns, and stabilization strength required for functional activities: - lateral stepping on 5 foot aeromat with B UE support on TM bar as needed, 5x each direction with CGA.  - standing ball toss: forwards , lateral ball toss with 3kg med ball while standing on ariex pad 1x20 each side with CGA, 1x30 with 3kg med ball on firm surface with SBA. - standing forwards ball slams with 15# slam ball (PT picks it up from floor and hands back to patient). Chair behind for safety.    Pt required multimodal cuing for proper technique and to facilitate improved neuromuscular control, strength, range of motion, and functional  ability resulting in improved performance and form.   HOME EXERCISE PROGRAM:  Access Code: [email or web address] URL: [email or web address] Date: 11/13/2021 Prepared by: Rosita Kea  Exercises - Sit to Stand  - 1 x weekly - 2-3 sets - 5-10 reps - Seated Isometric Hip Adduction with Ball  - 1 x daily - 2-3 sets - 5-10 reps - 5 seconds hold - Seated Toe Raise  - 1 x daily - 2-3 sets - 10 reps - Seated Single Arm Shoulder Row with Anchored Resistance  - 1 x daily - 3 sets - 10 reps  PATIENT EDUCATION:  Education details: Form/technique with exercise.  Person educated:  Patient Education method: Explanation, Demonstration, Tactile cues, and Verbal cues Education comprehension: verbalized understanding, returned demonstration, verbal cues required, tactile cues required, and needs further education   ASSESSMENT:  CLINICAL IMPRESSION: Patient tolerated treatment well overall with mild increase in low back pain and muscle fatigue felt in bilateral quads and in abdominal muscles. Patient was able to complete more sit to stands with TRX support with less assistance than at last attempt and continues to work on core strength and balance. Patient continues to be limited by back and leg pain, weakness, and motor control deficits and at increased risk of falls with significantly limited functional mobility. Patient would benefit from continued management of limiting condition by skilled physical therapist to address remaining impairments and functional limitations to work towards stated goals and return to PLOF or maximal functional independence.   OBJECTIVE IMPAIRMENTS Abnormal gait, decreased activity tolerance, decreased balance, decreased coordination, decreased endurance, decreased knowledge of use of DME, decreased mobility, difficulty walking, decreased ROM, decreased strength, impaired perceived functional ability, impaired flexibility, impaired sensation, impaired tone, impaired UE functional use, postural dysfunction, obesity, and pain.   ACTIVITY LIMITATIONS cleaning, community activity, driving, occupation, laundry, shopping, and basic mobility such as bed mobility, transfers, driving, household and community ambulation, preventing falls, floor transfers, playing with his daughter, stairs, social participation and socializing with friends .   PERSONAL FACTORS Fitness, Past/current experiences, Profession, Social background, Time since onset of injury/illness/exacerbation, and 3+ comorbidities: 3 thoracic spine surgeries, thoracic MRI notes "Myelomalacia with severe  cord atrophy from T7 through T9-10 and mild decreased volume the remainder of the thoracic cord, stable" s/p right L4-5 laminotomy, microdiscectomy on 10/06/2021, history of pressure to cauda equina, thoracic disc disease with myelopathy, hand pain, urinary and bowel urgency  are also affecting patient's functional outcome.    REHAB POTENTIAL: Good  CLINICAL DECISION MAKING: Evolving/moderate complexity  EVALUATION COMPLEXITY: Moderate   GOALS: Goals reviewed with patient? No  SHORT TERM GOALS: Target date: 11/15/2021  Be independent with initial home exercise program for self-management of symptoms. Baseline: initial HEP to be provided at visit 2 as appropriate (11/01/2021);  Goal status: MET  LONG TERM GOALS: Target date: 01/24/2022; Updated to 04/09/2022 on 01/15/2022.  Be independent with a long-term home exercise program for self-management of symptoms.  Baseline: Initial HEP to be provided at visit 2 as appropriate (11/01/2021); patient currently participating well (12/06/2021; 01/15/2022; 02/26/2022);  Goal status: IN PROGRESS  2.  Demonstrate improved FOTO to equal or greater than 42 by visit #14 to demonstrate improvement in overall condition and self-reported functional ability.  Baseline: 26 (11/01/2021); 45 at visit #10 (12/06/2021); 53 at visit# 20 (01/15/2022); 50 at visit #30 (02/26/2022);  Goal status: ACHIEVED 12/06/2021  3.  Patient will complete 5 Time Sit To Stand Test in equal or less than 15 seconds for 18.5 inch surface with  no UE support to demonstrate improved ability to complete transfers and decrease fall risk.  Baseline: 45 second from 18.5 inch plinth with R UE support on plinth and L UE support on locked rollator and B UE support on rollator in standing. Multiple attempts needed on some reps (11/01/2021); 21 second from 18.5 inch plinth with B UE support on plinth. Walker positioned in front of him for safety but did not need it. (12/06/2021); 21 second from 18.5 inch plinth  with no UE support. Walker positioned in front of him for safety but did not need it (01/15/2022); 15 seconds from 18.5 inch plinth with no UE support. Walker positioned in front of him for safety but did not need it (02/26/2022); Goal status: IN PROGRESS  4.  Patient will ambulate equal or greater than 1200 feet on 6 Minute Walk Test with [facility]  to demonstrate improved household and community mobility as well as community and social participation.  Baseline: to be tested visit 2 as appropriate (11/01/2021); 800 feet with rollator. Low back pain increased to 4-5/10 and had pain down R LE to calf (11/13/2021); 943 feet with SPC and SBA (two little standing breaks to stretch back due to tightness at low back, increased sharper pain in back and B legs, 12/06/2021);  948 feet with SPC (01/09/2022); 1045 feet with SPC no rests  but catches toes on floor at times (01/15/2022); 936 feet with SBA and mostly carrying cane for first 4 min of test. Scuffing feet on floor slightly. No stumbles. Sharp pain developed around 4 min in right glute/hip region worse on initial contact that remained after seated rest from R glute to anterior thigh to knee and in toes (02/26/2022);  Goal status: IN PROGRESS  5.  Complete community, work and/or recreational activities without limitation due to current condition.  Baseline: difficulty with bed mobility, transfers, driving, household and community ambulation, preventing falls, floor transfers, playing with his daughter, stairs, social participation and socializing with friends (11/01/2021); walking is getting easier, transfers are easier, doesn't have to use his rollator as much, stairs are still troublesome, has not tried floor transfer, still restricted socially and continues to have significant limitations with functional activities and mobility (12/06/2021); walking and transfers are easier and is using [place] at home instead of rollator, continues to be restricted socially and have significant  limitations with functional activities, basic mobility, and balance (01/15/2022); driving is better and he is able to move his foot instead of his whole leg, still has not been able to get to the floor and back up, it is getting a little easier to do things around the house, still hard to lift heavy boxes and lift his daughter, he can stand a little longer to do the dishes, vacuuming the floor is less difficult than before, he continues to have limitations in socialization and community mobility, still is concerned about falling, continues to have urgency with bowel and bladder (02/26/2022);  Goal status: IN PROGRESS     PLAN: PT FREQUENCY: 2x/week  PT DURATION: 12 weeks  PLANNED INTERVENTIONS: Therapeutic exercises, Therapeutic activity, Neuromuscular re-education, Balance training, Gait training, Patient/Family education, Joint mobilization, Stair training, DME instructions, Dry Needling, and Electrical stimulation.  PLAN FOR NEXT SESSION: Update HEP as tolerated. Progressive LE, core, functional strengthening and balance exercises as tolerated and appropriate.    Everlean Alstrom. Graylon Good, PT, DPT 02/28/22, 7:57 PM  [facility] [address] [place], [place] [id number] P: [phone] I F:  [phone]

## 2022-03-06 ENCOUNTER — Encounter: Payer: Self-pay | Admitting: Physical Therapy

## 2022-03-06 ENCOUNTER — Ambulatory Visit: Payer: Medicare Other | Admitting: Physical Therapy

## 2022-03-06 DIAGNOSIS — M6281 Muscle weakness (generalized): Secondary | ICD-10-CM

## 2022-03-06 DIAGNOSIS — M546 Pain in thoracic spine: Secondary | ICD-10-CM

## 2022-03-06 DIAGNOSIS — R296 Repeated falls: Secondary | ICD-10-CM

## 2022-03-06 DIAGNOSIS — M5459 Other low back pain: Secondary | ICD-10-CM

## 2022-03-06 DIAGNOSIS — R262 Difficulty in walking, not elsewhere classified: Secondary | ICD-10-CM

## 2022-03-06 DIAGNOSIS — R2689 Other abnormalities of gait and mobility: Secondary | ICD-10-CM

## 2022-03-06 NOTE — Therapy (Signed)
OUTPATIENT PHYSICAL THERAPY TREATMENT NOTE   Patient Name: Travis Fitzpatrick MRN: [id number] DOB:05/24/1981, 41 y.o., male Today's Date: 03/06/22     PT End of Session - 03/06/22 1821     Visit Number 32    Number of Visits 43    Date for PT Re-Evaluation 04/09/22    Authorization Type MEDICARE PART B reporting period from 01/15/2022    Progress Note Due on Visit 40    PT Start Time 1817    PT Stop Time 1855    PT Time Calculation (min) 38 min    Equipment Utilized During Treatment Gait belt    Activity Tolerance Patient tolerated treatment well    Behavior During Therapy WFL for tasks assessed/performed               Past Medical History:  Diagnosis Date   Bowel trouble    urgency   Medical history non-contributory    Urinary urgency    Past Surgical History:  Procedure Laterality Date   BACK SURGERY  2010   CIRCUMCISION     LUMBAR LAMINECTOMY/DECOMPRESSION MICRODISCECTOMY  07/20/2011   Procedure: LUMBAR LAMINECTOMY/DECOMPRESSION MICRODISCECTOMY;  Surgeon: Kyle L Cabbell;  Location: [facility];  Service: Neurosurgery;  Laterality: N/A;  right thoracotomy with thoracic eight-nine discectomy and fusion   LUMBAR LAMINECTOMY/DECOMPRESSION MICRODISCECTOMY Right 10/06/2021   Procedure: Right Lumbar Four-Five Microdiscectomy, Right Lumbar Five-Sacal One Foraminotomy;  Surgeon: Cabbell, Kyle, MD;  Location: [facility];  Service: Neurosurgery;  Laterality: Right;  3C/RM 21   THORACIC DISCECTOMY  07/16/2012   Procedure: THORACIC DISCECTOMY;  Surgeon: Kyle L Cabbell, MD;  Location: [facility];  Service: Neurosurgery;  Laterality: Right;  RIGHT Thoracic seven-eight  thoracic diskectomy via thoracotomy by dr bartle   THORACIC DISCECTOMY N/A 12/15/2014   Procedure: THORACIC SEVEN TO THORACIC NINE Laminectomy ;  Surgeon: Kyle Cabbell, MD;  Location: [facility];  Service: Neurosurgery;  Laterality: N/A;   THORACIC DISCECTOMY N/A 06/06/2017   Procedure: LAMINECTOMY THORACIC NINE-TEN;   Surgeon: Cabbell, Kyle, MD;  Location: [facility];  Service: Neurosurgery;  Laterality: N/A;  LAMINECTOMY THORACIC NINE-TEN   THORACOTOMY  07/20/2011   Procedure: THORACOTOMY OPEN FOR SPINE SURGERY;  Surgeon: D Patrick Burney, MD;  Location: [facility];  Service: Vascular;  Laterality: N/A;   THORACOTOMY  07/16/2012   Procedure: THORACOTOMY OPEN FOR SPINE SURGERY;  Surgeon: Bryan K Bartle, MD;  Location: [facility];  Service: Thoracic;  Laterality: N/A;   Patient Active Problem List   Diagnosis Date Noted   HNP (herniated nucleus pulposus), lumbar 10/06/2021   Hand pain 06/28/2021   Lumbar facet arthropathy 01/11/2021   Thoracic spondylosis with myelopathy 01/11/2021   Thoracic spinal stenosis 06/06/2017   Stenosis, spinal, thoracic 12/15/2014   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 09/22/2014   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/2011    [facility]P: [facility] [place]  REFERRING PROVIDER: Cabbell, Kyle, MD  REFERRING DIAG: 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  PERTINENT HISTORY: Patient is a 41 y.o. male who presents to outpatient physical therapy with   a referral for medical diagnosis  intervertebral disc disorder of thoracic region with myelopathy. This patient's chief complaints consist of low back pain, R LE pain/paresthesia/weakness, L LE weakness s/p right L4-5 laminotomy, microdiscectomy on 10/06/2021 leading to the following functional deficits: difficulty with bed mobility, transfers,  driving, household and community ambulation, preventing falls, floor transfers, playing with his daughter, stairs, social participation and socializing with friends. 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    Patient denies hx of cancer, stroke, seizures, lung problems, heart problems, diabetes, unexplained weight loss, and osteoporosis. Patient reports he has difficulty with making it to the bathroom on time when he gets bowel or bladder urges and has stumbling. He has history of thoracic and lumbar surgeries.  PRECAUTIONS: Other: no lifting > 10 lbs, no bending/twisting at waist (at least until 01/13/2022). Don't lay on stomach.    PATIENT GOALS: get better and get off the walker to walk with [facility], play with his daughter (15 months), get his strength back, be able to get up and down from floor.  SUBJECTIVE: Patient arrives on rollator. He states his pain is 4/10 in his low back and down both posterior thighs and in his anterior knees when he puts weight on them. He states his pain has been elevated since last PT session. He reports his legs felt weak and hard to walk like a nerve is pinched on Sunday.He took it easy over the weekend trying to get the pain to come down.     PAIN:  Are you having pain? 4/10 Location: surgical site at low back and B posterior thighs to the front of the knees.   OBJECTIVE    TODAY'S TREATMENT  Therapeutic exercise: to centralize symptoms and improve ROM, strength, muscular endurance, and activity tolerance required for successful completion of functional activities.  - NuStep level 5 using bilateral upper and  lower extremities. Seat setting 11. For improved extremity mobility, muscular endurance, and activity tolerance; and to induce the analgesic effect of aerobic exercise, stimulate improved joint nutrition, and prepare body structures and systems for following interventions. 6 min. Average SPM = 72. - 6" step ups with B UE support, 1x10/LE. (Causing pain at knees).  - hooklying LTR, 2 min with self selected holds.  - hooklying AAROM double knees to chest with green theraball under heels, 2x10 - hooklying AAROM progressing to AROM low trunk rotation, 1x10 each side.  - quadruped alternating LE extension (L LE stays on mat due to pain in R hip). 2x5-10 each side.  - quadruped with abdomen supported on green theraball: alternating hip extension, 2x10 each side - modified child's pose stretch between quadruped exercises, self selected holds.  - quadruped alternating shoulder flexion, 2x10 each side.  - prone B hip IR/ER with knees flexed as close to 90 degrees as able, AAROM progressing to AROM, ~ 10 each way .  Manual therapy: to reduce pain and tissue tension, improve range of motion, neuromodulation, in order to promote improved ability to complete functional activities. PRONE - STM to lumbar and lower thoracic musculature and most recent scar to decreased pain and muscle tension.   Pt required multimodal cuing for proper technique and to facilitate improved  neuromuscular control, strength, range of motion, and functional ability resulting in improved performance and form.   HOME EXERCISE PROGRAM:  Access Code: [email or web address] URL: [email or web address] Date: 11/13/2021 Prepared by: Rosita Kea  Exercises - Sit to Stand  - 1 x weekly - 2-3 sets - 5-10 reps - Seated Isometric Hip Adduction with Ball  - 1 x daily - 2-3 sets - 5-10 reps - 5 seconds hold - Seated Toe Raise  - 1 x daily - 2-3 sets - 10 reps - Seated Single Arm Shoulder Row with Anchored Resistance  - 1 x daily - 3 sets - 10  reps  PATIENT EDUCATION:  Education details: Form/technique with exercise.  Person educated: Patient Education method: Explanation, Demonstration, Tactile cues, and Verbal cues Education comprehension: verbalized understanding, returned demonstration, verbal cues required, tactile cues required, and needs further education   ASSESSMENT:  CLINICAL IMPRESSION: Patient arrives with elevated and peripheralized pain this session that was limiting his tolerance for standing activities, patient completed mat exercises for core and hip strength and motor control with good results. He was challenged with exercises for hip, core, and low back strength and motor control without increasing his pain. He reported decreased pain and improved function following mat exercises and manual therapy. He rated his pain had improved from 4/10 to 2/10. His bed mobility and ability to balance in quadruped and tall kneeling improved significantly since last attempt at mat exercises was made. Patient would benefit from continued exercise on the mat in addition to his functional exercises. Plan to continue with both next session as appropriate and use manual therapy for pain control. Patient would benefit from continued management of limiting condition by skilled physical therapist to address remaining impairments and functional limitations to work towards stated goals and return to PLOF or maximal functional independence.   OBJECTIVE IMPAIRMENTS Abnormal gait, decreased activity tolerance, decreased balance, decreased coordination, decreased endurance, decreased knowledge of use of DME, decreased mobility, difficulty walking, decreased ROM, decreased strength, impaired perceived functional ability, impaired flexibility, impaired sensation, impaired tone, impaired UE functional use, postural dysfunction, obesity, and pain.   ACTIVITY LIMITATIONS cleaning, community activity, driving, occupation, laundry, shopping, and basic  mobility such as bed mobility, transfers, driving, household and community ambulation, preventing falls, floor transfers, playing with his daughter, stairs, social participation and socializing with friends .   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    REHAB POTENTIAL: Good  CLINICAL DECISION MAKING: Evolving/moderate complexity  EVALUATION COMPLEXITY: Moderate   GOALS: Goals reviewed with patient? No  SHORT TERM GOALS: Target date: 11/15/2021  Be independent with initial home exercise program for self-management of symptoms. Baseline: initial HEP to be provided at visit 2 as appropriate (11/01/2021);  Goal status: MET  LONG TERM GOALS: Target date: 01/24/2022; Updated to 04/09/2022 on 01/15/2022.  Be independent with a long-term home exercise program for self-management of symptoms.  Baseline: Initial HEP to be provided at visit 2 as appropriate (11/01/2021); patient currently participating well (12/06/2021; 01/15/2022; 02/26/2022);  Goal status: IN PROGRESS  2.  Demonstrate improved FOTO to equal or greater than 42 by visit #14  to demonstrate improvement in overall condition and self-reported functional ability.  Baseline: 26 (11/01/2021); 45 at visit #10 (12/06/2021); 53 at visit# 20 (01/15/2022); 50 at visit #30 (02/26/2022);  Goal status: ACHIEVED 12/06/2021  3.  Patient will complete 5 Time Sit To Stand Test in equal or less than 15 seconds for 18.5 inch surface with no UE support to  demonstrate improved ability to complete transfers and decrease fall risk.  Baseline: 45 second from 18.5 inch plinth with R UE support on plinth and L UE support on locked rollator and B UE support on rollator in standing. Multiple attempts needed on some reps (11/01/2021); 21 second from 18.5 inch plinth with B UE support on plinth. Walker positioned in front of him for safety but did not need it. (12/06/2021); 21 second from 18.5 inch plinth with no UE support. Walker positioned in front of him for safety but did not need it (01/15/2022); 15 seconds from 18.5 inch plinth with no UE support. Walker positioned in front of him for safety but did not need it (02/26/2022); Goal status: IN PROGRESS  4.  Patient will ambulate equal or greater than 1200 feet on 6 Minute Walk Test with SPC  to demonstrate improved household and community mobility as well as community and social participation.  Baseline: to be tested visit 2 as appropriate (11/01/2021); 800 feet with rollator. Low back pain increased to 4-5/10 and had pain down R LE to calf (11/13/2021); 943 feet with SPC and SBA (two little standing breaks to stretch back due to tightness at low back, increased sharper pain in back and B legs, 12/06/2021);  948 feet with SPC (01/09/2022); 1045 feet with SPC no rests  but catches toes on floor at times (01/15/2022); 936 feet with SBA and mostly carrying cane for first 4 min of test. Scuffing feet on floor slightly. No stumbles. Sharp pain developed around 4 min in right glute/hip region worse on initial contact that remained after seated rest from R glute to anterior thigh to knee and in toes (02/26/2022);  Goal status: IN PROGRESS  5.  Complete community, work and/or recreational activities without limitation due to current condition.  Baseline: difficulty with bed mobility, transfers, driving, household and community ambulation, preventing falls, floor transfers, playing with his daughter, stairs, social participation and  socializing with friends (11/01/2021); walking is getting easier, transfers are easier, doesn't have to use his rollator as much, stairs are still troublesome, has not tried floor transfer, still restricted socially and continues to have significant limitations with functional activities and mobility (12/06/2021); walking and transfers are easier and is using SPC at home instead of rollator, continues to be restricted socially and have significant limitations with functional activities, basic mobility, and balance (01/15/2022); driving is better and he is able to move his foot instead of his whole leg, still has not been able to get to the floor and back up, it is getting a little easier to do things around the house, still hard to lift heavy boxes and lift his daughter, he can stand a little longer to do the dishes, vacuuming the floor is less difficult than before, he continues to have limitations in socialization and community mobility, still is concerned about falling, continues to have urgency with bowel and bladder (02/26/2022);  Goal status: IN PROGRESS     PLAN: PT FREQUENCY: 2x/week  PT DURATION: 12 weeks  PLANNED INTERVENTIONS: Therapeutic exercises, Therapeutic activity, Neuromuscular re-education, Balance training, Gait training, Patient/Family education, Joint mobilization, Stair training,   DME instructions, Dry Needling, and Electrical stimulation.  PLAN FOR NEXT SESSION: Update HEP as tolerated. Progressive LE, core, functional strengthening and balance exercises as tolerated and appropriate.    Sara R. Snyder, PT, DPT 03/06/22, 7:17 PM  [facility] [address] [place], [place] [address] P: [phone] I F: [phone] 

## 2022-03-08 ENCOUNTER — Encounter: Payer: Self-pay | Admitting: Physical Therapy

## 2022-03-08 ENCOUNTER — Ambulatory Visit: Payer: Medicare Other | Admitting: Physical Therapy

## 2022-03-08 DIAGNOSIS — M5459 Other low back pain: Secondary | ICD-10-CM

## 2022-03-08 DIAGNOSIS — M546 Pain in thoracic spine: Secondary | ICD-10-CM

## 2022-03-08 DIAGNOSIS — R2689 Other abnormalities of gait and mobility: Secondary | ICD-10-CM

## 2022-03-08 DIAGNOSIS — R262 Difficulty in walking, not elsewhere classified: Secondary | ICD-10-CM

## 2022-03-08 DIAGNOSIS — R296 Repeated falls: Secondary | ICD-10-CM

## 2022-03-08 DIAGNOSIS — M6281 Muscle weakness (generalized): Secondary | ICD-10-CM

## 2022-03-08 NOTE — Therapy (Signed)
OUTPATIENT PHYSICAL THERAPY TREATMENT NOTE   Patient Name: Travis Fitzpatrick MRN: [id number] DOB:Sep 20, 1980, 41 y.o., male Today's Date: 03/08/22     PT End of Session - 03/08/22 1731     Visit Number 33    Number of Visits 43    Date for PT Re-Evaluation 04/09/22    Authorization Type MEDICARE PART B reporting period from 01/15/2022    Progress Note Due on Visit 82    PT Start Time 1730    PT Stop Time 1810    PT Time Calculation (min) 40 min    Equipment Utilized During Treatment Gait belt    Activity Tolerance Patient tolerated treatment well    Behavior During Therapy WFL for tasks assessed/performed                Past Medical History:  Diagnosis Date   Bowel trouble    urgency   Medical history non-contributory    Urinary urgency    Past Surgical History:  Procedure Laterality Date   BACK SURGERY  2010   CIRCUMCISION     LUMBAR LAMINECTOMY/DECOMPRESSION MICRODISCECTOMY  07/20/2011   Procedure: LUMBAR LAMINECTOMY/DECOMPRESSION MICRODISCECTOMY;  Surgeon: Winfield Cunas;  Location: [place];  Service: Neurosurgery;  Laterality: N/A;  right thoracotomy with thoracic eight-nine discectomy and fusion   LUMBAR LAMINECTOMY/DECOMPRESSION MICRODISCECTOMY Right 10/06/2021   Procedure: Right Lumbar Four-Five Microdiscectomy, Right Lumbar Five-Sacal One Foraminotomy;  Surgeon: Ashok Pall, MD;  Location: [place];  Service: Neurosurgery;  Laterality: Right;  3C/RM 21   THORACIC DISCECTOMY  07/16/2012   Procedure: THORACIC DISCECTOMY;  Surgeon: Winfield Cunas, MD;  Location: [place] [facility];  Service: Neurosurgery;  Laterality: Right;  RIGHT Thoracic seven-eight  thoracic diskectomy via thoracotomy by dr Cyndia Bent   THORACIC DISCECTOMY N/A 12/15/2014   Procedure: THORACIC SEVEN TO THORACIC NINE Laminectomy ;  Surgeon: Ashok Pall, MD;  Location: [place] [facility];  Service: Neurosurgery;  Laterality: N/A;   THORACIC DISCECTOMY N/A 06/06/2017   Procedure: LAMINECTOMY THORACIC NINE-TEN;   Surgeon: Ashok Pall, MD;  Location: [place];  Service: Neurosurgery;  Laterality: N/A;  LAMINECTOMY THORACIC NINE-TEN   THORACOTOMY  07/20/2011   Procedure: THORACOTOMY OPEN FOR SPINE SURGERY;  Surgeon: Pierre Bali, MD;  Location: [facility];  Service: Vascular;  Laterality: N/A;   THORACOTOMY  07/16/2012   Procedure: THORACOTOMY OPEN FOR SPINE SURGERY;  Surgeon: Gaye Pollack, MD;  Location: [place];  Service: Thoracic;  Laterality: N/A;   Patient Active Problem List   Diagnosis Date Noted   HNP (herniated nucleus pulposus), lumbar 10/06/2021   Hand pain 06/28/2021   Lumbar facet arthropathy 01/11/2021   Thoracic spondylosis with myelopathy 01/11/2021   Thoracic spinal stenosis 06/06/2017   Stenosis, spinal, thoracic 12/15/2014   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 09/22/2014   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/2011    PCP: [facility] [place]  REFERRING PROVIDER: Ashok Pall, MD  REFERRING DIAG: 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  PERTINENT HISTORY: Patient is a 41 y.o. male who presents to outpatient physical therapy  with a referral for medical diagnosis  intervertebral disc disorder of thoracic region with myelopathy. This patient's chief complaints consist of low back pain, R LE pain/paresthesia/weakness, L LE weakness s/p right L4-5 laminotomy, microdiscectomy on 10/06/2021 leading to the following functional deficits: difficulty with bed mobility, transfers,  driving, household and community ambulation, preventing falls, floor transfers, playing with his daughter, stairs, social participation and socializing with friends. 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    Patient denies hx of cancer, stroke, seizures, lung problems, heart problems, diabetes, unexplained weight loss, and osteoporosis. Patient reports he has difficulty with making it to the bathroom on time when he gets bowel or bladder urges and has stumbling. He has history of thoracic and lumbar surgeries.  PRECAUTIONS: Other: no lifting > 10 lbs, no bending/twisting at waist (at least until 01/13/2022). Don't lay on stomach.    PATIENT GOALS: get better and get off the walker to walk with [facility], play with his daughter (15 months), get his strength back, be able to get up and down from floor.  SUBJECTIVE: Patient arrives with rollator and states he is feeling better than last PT session. He states he continued to feel better after last PT session. He has been doing some of the new exercises at home. He would really like to work on balance today. He has 2x10 pain at lower back and back of right hip and thigh. He states sometimes when he gets up he will stumble.    PAIN:  Are you having pain? 2/10 Location: surgical site at low back and B posterior thighs.   OBJECTIVE    TODAY'S TREATMENT  Therapeutic exercise: to centralize symptoms and improve ROM, strength, muscular endurance, and activity tolerance required for successful completion of functional activities.  - NuStep level 6 using bilateral upper and lower extremities. Seat setting 11.  For improved extremity mobility, muscular endurance, and activity tolerance; and to induce the analgesic effect of aerobic exercise, stimulate improved joint nutrition, and prepare body structures and systems for following interventions. 6 min. Average SPM = 83.  Neuromuscular Re-education: to improve, balance, postural strength, muscle activation patterns, and stabilization strength required for functional activities:  - Lateral steps on aeormat/airex bar x5 each direction with touchdown UE support as needed along TM bar with CGA.  - forward step up to airex, 2kg ball toss/catch at rebounder while standing on airex, step back off airex, 1x10 each side leading with CGA. Occasional UE support on back of nustep at left or back of chair placed in front of patient.  - forward step up to horizontal aeromat, 3kg ball toss/catch at rebounder while standing on aeromat, step back off aeromat, 1x10 each side leading with CGA. Occasional UE support on back of nustep at left or back of chair placed in front of patient.   Kicking activities at end of hallway with doors closed so ball bounces back to patient (CGA-min A):  - kicking small pink theraball repeatedly while repositioning body to kick it again as it bounces back attempting to use no UE support on hall walls, 2x~ 5-7  min. Self selected foot use.  - kicking small green childrens' ball against end of hallway with  attempt to stop ball by placing foot on top of it at it's return to stabilize it before kicking it again. Attempted for approx 4 min but lacks coordination and strength to get foot on top of the ball, stablilize it, and remove foot from ball without knocking it another direction.   - standing single leg volley ball tap: ball is on floor and patient taps the top of the ball with his foot attempting to not disturb ball position. 2x10 each side with U UE support on contralateral side. SBA.   Pt required multimodal cuing for proper technique and to  facilitate improved neuromuscular control, strength, range of motion, and functional ability resulting in improved performance and form.   HOME EXERCISE PROGRAM:  Access Code: [email or web address] URL: [email or web address] Date: 11/13/2021 Prepared by: Rosita Kea  Exercises - Sit to Stand  - 1 x weekly - 2-3 sets - 5-10 reps - Seated Isometric Hip Adduction with Ball  - 1 x daily - 2-3 sets - 5-10 reps - 5 seconds hold - Seated Toe Raise  - 1 x daily - 2-3 sets - 10 reps - Seated Single Arm Shoulder Row with Anchored Resistance  - 1 x daily - 3 sets - 10 reps  PATIENT EDUCATION:  Education details: Form/technique with exercise.  Person educated: Patient Education method: Explanation, Demonstration, Tactile cues, and Verbal cues Education comprehension: verbalized understanding, returned demonstration, verbal cues required, tactile cues required, and needs further education   ASSESSMENT:  CLINICAL IMPRESSION: Patient arrives with improved pain and appears to have experienced significant benefit with good carry over between sessions from manual therapy and mat exercises performed last session. Today's visit focused on balance with some exercise for coordination and hip flexion strength to improve foot clearance, quick stepping to improve ability to use step strategy to decrease falls, and improve standing tolerance. Patient tolerated treatment well with report of slightly centralized pain by end of session. He occasionally required a seated rest but overall demonstrated improving activity tolerance. Overall patient demonstrating improvements in functional mobility and activity tolerance but still lacks sufficient motor control, balance, strength, and activity tolerance to ambulate safely without AD. Patient would benefit from continued management of limiting condition by skilled physical therapist to address remaining impairments and functional limitations to work towards stated goals and  return to PLOF or maximal functional independence.  OBJECTIVE IMPAIRMENTS Abnormal gait, decreased activity tolerance, decreased balance, decreased coordination, decreased endurance, decreased knowledge of use of DME, decreased mobility, difficulty walking, decreased ROM, decreased strength, impaired perceived functional ability, impaired flexibility, impaired sensation, impaired tone, impaired UE functional use, postural dysfunction, obesity, and pain.   ACTIVITY LIMITATIONS cleaning, community activity, driving, occupation, laundry, shopping, and basic mobility such as bed mobility, transfers, driving, household and community ambulation, preventing falls, floor transfers, playing with his daughter, stairs, social participation and socializing with friends .   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    REHAB POTENTIAL: Good  CLINICAL DECISION MAKING: Evolving/moderate complexity  EVALUATION COMPLEXITY: Moderate   GOALS: Goals reviewed with patient? No  SHORT TERM GOALS: Target date: 11/15/2021  Be independent  with initial home exercise program for self-management of symptoms. Baseline: initial HEP to be provided at visit 2 as appropriate (11/01/2021);  Goal status: MET  LONG TERM GOALS: Target date: 01/24/2022; Updated to 04/09/2022 on 01/15/2022.  Be independent with a long-term home exercise program for self-management of symptoms.  Baseline: Initial HEP to be provided at visit 2 as  appropriate (11/01/2021); patient currently participating well (12/06/2021; 01/15/2022; 02/26/2022);  Goal status: IN PROGRESS  2.  Demonstrate improved FOTO to equal or greater than 42 by visit #14 to demonstrate improvement in overall condition and self-reported functional ability.  Baseline: 26 (11/01/2021); 45 at visit #10 (12/06/2021); 53 at visit# 20 (01/15/2022); 50 at visit #30 (02/26/2022);  Goal status: ACHIEVED 12/06/2021  3.  Patient will complete 5 Time Sit To Stand Test in equal or less than 15 seconds for 18.5 inch surface with no UE support to demonstrate improved ability to complete transfers and decrease fall risk.  Baseline: 45 second from 18.5 inch plinth with R UE support on plinth and L UE support on locked rollator and B UE support on rollator in standing. Multiple attempts needed on some reps (11/01/2021); 21 second from 18.5 inch plinth with B UE support on plinth. Walker positioned in front of him for safety but did not need it. (12/06/2021); 21 second from 18.5 inch plinth with no UE support. Walker positioned in front of him for safety but did not need it (01/15/2022); 15 seconds from 18.5 inch plinth with no UE support. Walker positioned in front of him for safety but did not need it (02/26/2022); Goal status: IN PROGRESS  4.  Patient will ambulate equal or greater than 1200 feet on 6 Minute Walk Test with [facility]  to demonstrate improved household and community mobility as well as community and social participation.  Baseline: to be tested visit 2 as appropriate (11/01/2021); 800 feet with rollator. Low back pain increased to 4-5/10 and had pain down R LE to calf (11/13/2021); 943 feet with SPC and SBA (two little standing breaks to stretch back due to tightness at low back, increased sharper pain in back and B legs, 12/06/2021);  948 feet with SPC (01/09/2022); 1045 feet with SPC no rests  but catches toes on floor at times (01/15/2022); 936 feet with SBA and mostly carrying cane for first 4 min of  test. Scuffing feet on floor slightly. No stumbles. Sharp pain developed around 4 min in right glute/hip region worse on initial contact that remained after seated rest from R glute to anterior thigh to knee and in toes (02/26/2022);  Goal status: IN PROGRESS  5.  Complete community, work and/or recreational activities without limitation due to current condition.  Baseline: difficulty with bed mobility, transfers, driving, household and community ambulation, preventing falls, floor transfers, playing with his daughter, stairs, social participation and socializing with friends (11/01/2021); walking is getting easier, transfers are easier, doesn't have to use his rollator as much, stairs are still troublesome, has not tried floor transfer, still restricted socially and continues to have significant limitations with functional activities and mobility (12/06/2021); walking and transfers are easier and is using [place] at home instead of rollator, continues to be restricted socially and have significant limitations with functional activities, basic mobility, and balance (01/15/2022); driving is better and he is able to move his foot instead of his whole leg, still has not been able to get to the floor and back up, it is getting a little easier to do things around the  house, still hard to lift heavy boxes and lift his daughter, he can stand a little longer to do the dishes, vacuuming the floor is less difficult than before, he continues to have limitations in socialization and community mobility, still is concerned about falling, continues to have urgency with bowel and bladder (02/26/2022);  Goal status: IN PROGRESS   PLAN: PT FREQUENCY: 2x/week  PT DURATION: 12 weeks  PLANNED INTERVENTIONS: Therapeutic exercises, Therapeutic activity, Neuromuscular re-education, Balance training, Gait training, Patient/Family education, Joint mobilization, Stair training, DME instructions, Dry Needling, and Electrical  stimulation.  PLAN FOR NEXT SESSION: Update HEP as tolerated. Progressive LE, core, functional strengthening and balance exercises as tolerated and appropriate.    Everlean Alstrom. Graylon Good, PT, DPT 03/08/22, 6:35 PM  [facility] [address] [place], [place] [address] P: [phone] I F: [phone]

## 2022-03-12 ENCOUNTER — Encounter: Payer: Self-pay | Admitting: Physical Therapy

## 2022-03-12 ENCOUNTER — Ambulatory Visit: Payer: Medicare Other

## 2022-03-12 DIAGNOSIS — M5459 Other low back pain: Secondary | ICD-10-CM

## 2022-03-12 DIAGNOSIS — M6281 Muscle weakness (generalized): Secondary | ICD-10-CM

## 2022-03-12 DIAGNOSIS — R2689 Other abnormalities of gait and mobility: Secondary | ICD-10-CM

## 2022-03-12 DIAGNOSIS — R262 Difficulty in walking, not elsewhere classified: Secondary | ICD-10-CM

## 2022-03-12 DIAGNOSIS — M546 Pain in thoracic spine: Secondary | ICD-10-CM

## 2022-03-12 NOTE — Therapy (Signed)
OUTPATIENT PHYSICAL THERAPY TREATMENT NOTE   Patient Name: Travis Fitzpatrick MRN: [id number] DOB:26-May-1981, 41 y.o., male Today's Date: 03/12/22     PT End of Session - 03/12/22 1818     Visit Number 34    Number of Visits 43    Date for PT Re-Evaluation 04/09/22    Authorization Type MEDICARE PART B reporting period from 01/15/2022    Progress Note Due on Visit 40    PT Start Time 1815    PT Stop Time 1900    PT Time Calculation (min) 45 min    Equipment Utilized During Treatment Gait belt    Activity Tolerance Patient tolerated treatment well    Behavior During Therapy WFL for tasks assessed/performed                Past Medical History:  Diagnosis Date   Bowel trouble    urgency   Medical history non-contributory    Urinary urgency    Past Surgical History:  Procedure Laterality Date   BACK SURGERY  2010   CIRCUMCISION     LUMBAR LAMINECTOMY/DECOMPRESSION MICRODISCECTOMY  07/20/2011   Procedure: LUMBAR LAMINECTOMY/DECOMPRESSION MICRODISCECTOMY;  Surgeon: Winfield Cunas;  Location: [facility];  Service: Neurosurgery;  Laterality: N/A;  right thoracotomy with thoracic eight-nine discectomy and fusion   LUMBAR LAMINECTOMY/DECOMPRESSION MICRODISCECTOMY Right 10/06/2021   Procedure: Right Lumbar Four-Five Microdiscectomy, Right Lumbar Five-Sacal One Foraminotomy;  Surgeon: Ashok Pall, MD;  Location: [place];  Service: Neurosurgery;  Laterality: Right;  3C/RM 21   THORACIC DISCECTOMY  07/16/2012   Procedure: THORACIC DISCECTOMY;  Surgeon: Winfield Cunas, MD;  Location: [place] [facility];  Service: Neurosurgery;  Laterality: Right;  RIGHT Thoracic seven-eight  thoracic diskectomy via thoracotomy by dr Cyndia Bent   THORACIC DISCECTOMY N/A 12/15/2014   Procedure: THORACIC SEVEN TO THORACIC NINE Laminectomy ;  Surgeon: Ashok Pall, MD;  Location: [place] [facility];  Service: Neurosurgery;  Laterality: N/A;   THORACIC DISCECTOMY N/A 06/06/2017   Procedure: LAMINECTOMY THORACIC NINE-TEN;   Surgeon: Ashok Pall, MD;  Location: [place];  Service: Neurosurgery;  Laterality: N/A;  LAMINECTOMY THORACIC NINE-TEN   THORACOTOMY  07/20/2011   Procedure: THORACOTOMY OPEN FOR SPINE SURGERY;  Surgeon: Pierre Bali, MD;  Location: [facility];  Service: Vascular;  Laterality: N/A;   THORACOTOMY  07/16/2012   Procedure: THORACOTOMY OPEN FOR SPINE SURGERY;  Surgeon: Gaye Pollack, MD;  Location: [place] [facility];  Service: Thoracic;  Laterality: N/A;   Patient Active Problem List   Diagnosis Date Noted   HNP (herniated nucleus pulposus), lumbar 10/06/2021   Hand pain 06/28/2021   Lumbar facet arthropathy 01/11/2021   Thoracic spondylosis with myelopathy 01/11/2021   Thoracic spinal stenosis 06/06/2017   Stenosis, spinal, thoracic 12/15/2014   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 09/22/2014   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/2011    PCP: [facility] [place]  REFERRING PROVIDER: Ashok Pall, MD  REFERRING DIAG: 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  PERTINENT HISTORY: Patient is a 41 y.o. male who presents to outpatient physical therapy with a referral  for medical diagnosis  intervertebral disc disorder of thoracic region with myelopathy. This patient's chief complaints consist of low back pain, R LE pain/paresthesia/weakness, L LE weakness s/p right L4-5 laminotomy, microdiscectomy on 10/06/2021 leading to the following functional deficits: difficulty with bed mobility, transfers, driving, household  and community ambulation, preventing falls, floor transfers, playing with his daughter, stairs, social participation and socializing with friends. 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    Patient denies hx of cancer, stroke, seizures, lung problems, heart problems, diabetes, unexplained weight loss, and osteoporosis. Patient reports he has difficulty with making it to the bathroom on time when he gets bowel or bladder urges and has stumbling. He has history of thoracic and lumbar surgeries.  PRECAUTIONS: Other: no lifting > 10 lbs, no bending/twisting at waist (at least until 01/13/2022). Don't lay on stomach.    PATIENT GOALS: get better and get off the walker to walk with [facility], play with his daughter (15 months), get his strength back, be able to get up and down from floor.  SUBJECTIVE: Patient arrives with rollator and states he is feeling better than last PT session. He states he continued to feel better after last PT session. He has been doing some of the new exercises at home. He would really like to work on balance today. He has 2x10 pain at lower back and back of right hip and thigh. He states sometimes when he gets up he will stumble.    PAIN:  Are you having pain? 2/10 Location: surgical site at low back and B posterior thighs.   OBJECTIVE    TODAY'S TREATMENT  03/12/22:  There.ex:  NuStep level 6 using bilateral upper and lower extremities. Seat setting 11. 6 minutes total   Neuro Re-Ed:  Lateral steps on aeormat/airex bar x5 each direction with touchdown UE support as needed along TM bar with CGA.    Forward step up to  horizontal aeromat, 2kg ball toss/catch at rebounder while standing on aeromat, step back off aeromat, 1x10 each side leading with CGA. Occasional UE support on back of nustep at left or back of chair placed in front of patient.   Tandem gait on airex beam: x10 laps (5 down and back). CGA. Intermittent light UE support  Side airex pad 3 Kg ball tosses trunk rotation: 2x5/side. CGA. Heavy hip/ankle strategy needed to correct  Seated trunk rotation with 3KG ball for thoracolumbar stretch and oblique strengthening. 2x20/side   Kicking activities at end of hallway with doors closed so ball bounces back to patient (CGA-min A): ~5-7 minutes. Pink physioball. Use of both feet.  Pt required multimodal cuing for proper technique and to facilitate improved neuromuscular control, strength, range of motion, and functional ability resulting in improved performance and form.   HOME EXERCISE PROGRAM:  Access Code: [email or web address] URL: [email or web address] Date: 11/13/2021 Prepared by: Rosita Kea  Exercises - Sit to Stand  - 1 x weekly - 2-3 sets - 5-10 reps - Seated Isometric Hip Adduction with Ball  - 1 x daily - 2-3 sets - 5-10 reps - 5 seconds hold - Seated Toe Raise  - 1 x daily - 2-3 sets - 10 reps - Seated Single Arm Shoulder  Row with Anchored Resistance  - 1 x daily - 3 sets - 10 reps  PATIENT EDUCATION:  Education details: Form/technique with exercise.  Person educated: Patient Education method: Explanation, Demonstration, Tactile cues, and Verbal cues Education comprehension: verbalized understanding, returned demonstration, verbal cues required, tactile cues required, and needs further education   ASSESSMENT:  CLINICAL IMPRESSION: Patient arrives with improved pain and appears to have experienced significant benefit with good carry over between sessions from manual therapy and mat exercises performed last session. Today's visit focused on balance with some exercise for  coordination and hip flexion strength to improve foot clearance, quick stepping to improve ability to use step strategy to decrease falls, and improve standing tolerance. Patient tolerated treatment well with report of slightly centralized pain by end of session. He occasionally required a seated rest but overall demonstrated improving activity tolerance. Overall patient demonstrating improvements in functional mobility and activity tolerance but still lacks sufficient motor control, balance, strength, and activity tolerance to ambulate safely without AD. Patient would benefit from continued management of limiting condition by skilled physical therapist to address remaining impairments and functional limitations to work towards stated goals and return to PLOF or maximal functional independence.  OBJECTIVE IMPAIRMENTS Abnormal gait, decreased activity tolerance, decreased balance, decreased coordination, decreased endurance, decreased knowledge of use of DME, decreased mobility, difficulty walking, decreased ROM, decreased strength, impaired perceived functional ability, impaired flexibility, impaired sensation, impaired tone, impaired UE functional use, postural dysfunction, obesity, and pain.   ACTIVITY LIMITATIONS cleaning, community activity, driving, occupation, laundry, shopping, and basic mobility such as bed mobility, transfers, driving, household and community ambulation, preventing falls, floor transfers, playing with his daughter, stairs, social participation and socializing with friends .   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    REHAB POTENTIAL: Good  CLINICAL DECISION MAKING: Evolving/moderate complexity  EVALUATION COMPLEXITY: Moderate   GOALS: Goals reviewed with patient? No  SHORT TERM GOALS: Target date: 11/15/2021  Be independent with initial home exercise program for self-management of symptoms. Baseline: initial HEP to be provided at visit 2 as appropriate (11/01/2021);  Goal status: MET  LONG TERM GOALS: Target date: 01/24/2022; Updated to 04/09/2022 on 01/15/2022.  Be independent with a long-term home exercise program for self-management of symptoms.  Baseline: Initial HEP to be provided at visit 2 as appropriate (11/01/2021); patient currently participating well (12/06/2021; 01/15/2022; 02/26/2022);  Goal status: IN PROGRESS  2.  Demonstrate improved FOTO to equal or greater than 42 by visit #14 to demonstrate improvement in overall condition and self-reported functional ability.  Baseline: 26 (11/01/2021); 45 at visit #10 (12/06/2021); 53 at visit# 20 (01/15/2022); 50 at visit #30 (02/26/2022);  Goal status: ACHIEVED 12/06/2021  3.  Patient will complete 5 Time Sit To Stand Test in equal or less than 15 seconds for 18.5 inch surface with no UE support to demonstrate improved ability to complete transfers and decrease fall risk.  Baseline: 45 second from 18.5 inch plinth with R UE support on plinth and L UE support on locked rollator and B UE support on rollator in standing. Multiple attempts needed on some  reps (11/01/2021); 21 second from 18.5 inch plinth with B UE support on plinth. Walker positioned in front of him for safety but did not need it. (12/06/2021); 21 second from 18.5 inch plinth with no UE support. Walker positioned in front of him for safety but did not need it (01/15/2022); 15 seconds from 18.5 inch plinth with no UE support. Walker positioned in front of him for  safety but did not need it (02/26/2022); Goal status: IN PROGRESS  4.  Patient will ambulate equal or greater than 1200 feet on 6 Minute Walk Test with [facility]  to demonstrate improved household and community mobility as well as community and social participation.  Baseline: to be tested visit 2 as appropriate (11/01/2021); 800 feet with rollator. Low back pain increased to 4-5/10 and had pain down R LE to calf (11/13/2021); 943 feet with SPC and SBA (two little standing breaks to stretch back due to tightness at low back, increased sharper pain in back and B legs, 12/06/2021);  948 feet with SPC (01/09/2022); 1045 feet with SPC no rests  but catches toes on floor at times (01/15/2022); 936 feet with SBA and mostly carrying cane for first 4 min of test. Scuffing feet on floor slightly. No stumbles. Sharp pain developed around 4 min in right glute/hip region worse on initial contact that remained after seated rest from R glute to anterior thigh to knee and in toes (02/26/2022);  Goal status: IN PROGRESS  5.  Complete community, work and/or recreational activities without limitation due to current condition.  Baseline: difficulty with bed mobility, transfers, driving, household and community ambulation, preventing falls, floor transfers, playing with his daughter, stairs, social participation and socializing with friends (11/01/2021); walking is getting easier, transfers are easier, doesn't have to use his rollator as much, stairs are still troublesome, has not tried floor transfer, still restricted socially and continues to have significant limitations with functional activities and mobility (12/06/2021); walking and transfers are easier and is using [employer] at home instead of rollator, continues to be restricted socially and have significant limitations with functional activities, basic mobility, and balance (01/15/2022); driving is better and he is able to move his foot instead of his whole leg, still has not been able to get  to the floor and back up, it is getting a little easier to do things around the house, still hard to lift heavy boxes and lift his daughter, he can stand a little longer to do the dishes, vacuuming the floor is less difficult than before, he continues to have limitations in socialization and community mobility, still is concerned about falling, continues to have urgency with bowel and bladder (02/26/2022);  Goal status: IN PROGRESS   PLAN: PT FREQUENCY: 2x/week  PT DURATION: 12 weeks  PLANNED INTERVENTIONS: Therapeutic exercises, Therapeutic activity, Neuromuscular re-education, Balance training, Gait training, Patient/Family education, Joint mobilization, Stair training, DME instructions, Dry Needling, and Electrical stimulation.  PLAN FOR NEXT SESSION: Update HEP as tolerated. Progressive LE, core, functional strengthening and balance exercises as tolerated and appropriate.    Salem Caster. Fairly IV, PT, DPT Physical Therapist- [facility]  03/12/22, 7:02 PM  [place] [address] [place], [place] [address] P: [phone] I F: [phone]

## 2022-03-13 ENCOUNTER — Other Ambulatory Visit: Payer: Self-pay | Admitting: Physical Medicine & Rehabilitation

## 2022-03-13 DIAGNOSIS — M4714 Other spondylosis with myelopathy, thoracic region: Secondary | ICD-10-CM

## 2022-03-13 DIAGNOSIS — M47816 Spondylosis without myelopathy or radiculopathy, lumbar region: Secondary | ICD-10-CM

## 2022-03-13 DIAGNOSIS — M4804 Spinal stenosis, thoracic region: Secondary | ICD-10-CM

## 2022-03-13 DIAGNOSIS — M5104 Intervertebral disc disorders with myelopathy, thoracic region: Secondary | ICD-10-CM

## 2022-03-14 ENCOUNTER — Encounter: Payer: Self-pay | Admitting: Physical Therapy

## 2022-03-14 ENCOUNTER — Ambulatory Visit: Payer: Medicare Other | Attending: Neurosurgery | Admitting: Physical Therapy

## 2022-03-14 DIAGNOSIS — R262 Difficulty in walking, not elsewhere classified: Secondary | ICD-10-CM | POA: Insufficient documentation

## 2022-03-14 DIAGNOSIS — M6281 Muscle weakness (generalized): Secondary | ICD-10-CM | POA: Diagnosis present

## 2022-03-14 DIAGNOSIS — R2689 Other abnormalities of gait and mobility: Secondary | ICD-10-CM | POA: Insufficient documentation

## 2022-03-14 DIAGNOSIS — R296 Repeated falls: Secondary | ICD-10-CM | POA: Insufficient documentation

## 2022-03-14 DIAGNOSIS — M546 Pain in thoracic spine: Secondary | ICD-10-CM | POA: Insufficient documentation

## 2022-03-14 DIAGNOSIS — M5459 Other low back pain: Secondary | ICD-10-CM | POA: Insufficient documentation

## 2022-03-14 NOTE — Therapy (Signed)
OUTPATIENT PHYSICAL THERAPY TREATMENT NOTE   Patient Name: Travis Fitzpatrick MRN: [id number] DOB:21-Jul-1981, 41 y.o., male Today's Date: 03/14/22     PT End of Session - 03/14/22 1927     Visit Number 35    Number of Visits 43    Date for PT Re-Evaluation 04/09/22    Authorization Type MEDICARE PART B reporting period from 01/15/2022    Progress Note Due on Visit 72    PT Start Time 1820    PT Stop Time 1900    PT Time Calculation (min) 40 min    Activity Tolerance Patient tolerated treatment well;Patient limited by pain    Behavior During Therapy [facility] for tasks assessed/performed                 Past Medical History:  Diagnosis Date   Bowel trouble    urgency   Medical history non-contributory    Urinary urgency    Past Surgical History:  Procedure Laterality Date   BACK SURGERY  2010   CIRCUMCISION     LUMBAR LAMINECTOMY/DECOMPRESSION MICRODISCECTOMY  07/20/2011   Procedure: LUMBAR LAMINECTOMY/DECOMPRESSION MICRODISCECTOMY;  Surgeon: Winfield Cunas;  Location: [place] [facility];  Service: Neurosurgery;  Laterality: N/A;  right thoracotomy with thoracic eight-nine discectomy and fusion   LUMBAR LAMINECTOMY/DECOMPRESSION MICRODISCECTOMY Right 10/06/2021   Procedure: Right Lumbar Four-Five Microdiscectomy, Right Lumbar Five-Sacal One Foraminotomy;  Surgeon: Ashok Pall, MD;  Location: [place];  Service: Neurosurgery;  Laterality: Right;  3C/RM 21   THORACIC DISCECTOMY  07/16/2012   Procedure: THORACIC DISCECTOMY;  Surgeon: Winfield Cunas, MD;  Location: [place] [facility];  Service: Neurosurgery;  Laterality: Right;  RIGHT Thoracic seven-eight  thoracic diskectomy via thoracotomy by dr Cyndia Bent   THORACIC DISCECTOMY N/A 12/15/2014   Procedure: THORACIC SEVEN TO THORACIC NINE Laminectomy ;  Surgeon: Ashok Pall, MD;  Location: [place] [facility];  Service: Neurosurgery;  Laterality: N/A;   THORACIC DISCECTOMY N/A 06/06/2017   Procedure: LAMINECTOMY THORACIC NINE-TEN;  Surgeon: Ashok Pall,  MD;  Location: [place];  Service: Neurosurgery;  Laterality: N/A;  LAMINECTOMY THORACIC NINE-TEN   THORACOTOMY  07/20/2011   Procedure: THORACOTOMY OPEN FOR SPINE SURGERY;  Surgeon: Pierre Bali, MD;  Location: [facility];  Service: Vascular;  Laterality: N/A;   THORACOTOMY  07/16/2012   Procedure: THORACOTOMY OPEN FOR SPINE SURGERY;  Surgeon: Gaye Pollack, MD;  Location: [place] [facility];  Service: Thoracic;  Laterality: N/A;   Patient Active Problem List   Diagnosis Date Noted   HNP (herniated nucleus pulposus), lumbar 10/06/2021   Hand pain 06/28/2021   Lumbar facet arthropathy 01/11/2021   Thoracic spondylosis with myelopathy 01/11/2021   Thoracic spinal stenosis 06/06/2017   Stenosis, spinal, thoracic 12/15/2014   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 09/22/2014   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/2011    PCP: [facility] [place]  REFERRING PROVIDER: Ashok Pall, MD  REFERRING DIAG: 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  PERTINENT HISTORY: Patient is a 41 y.o. male who presents to outpatient physical therapy with a referral for medical  diagnosis  intervertebral disc disorder of thoracic region with myelopathy. This patient's chief complaints consist of low back pain, R LE pain/paresthesia/weakness, L LE weakness s/p right L4-5 laminotomy, microdiscectomy on 10/06/2021 leading to the following functional deficits: difficulty with bed mobility, transfers, driving, household and  community ambulation, preventing falls, floor transfers, playing with his daughter, stairs, social participation and socializing with friends. 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    Patient denies hx of cancer, stroke, seizures, lung problems, heart problems, diabetes, unexplained weight loss, and osteoporosis. Patient reports he has difficulty with making it to the bathroom on time when he gets bowel or bladder urges and has stumbling. He has history of thoracic and lumbar surgeries.  PRECAUTIONS: falls. No post-op restrictions after 4 months from surgery.   PATIENT GOALS: get better and get off the walker to walk with [facility], play with his daughter (15 months), get his strength back, be able to get up and down from floor.  SUBJECTIVE: Patient arrives with rollator. States he continues to work on mat exercises at home and one added at last PT session where he completes weighted trunk rotations while seated. He states his pain has stayed about the same.    PAIN:  Are you having pain? 3/10 Location: surgical site at low back and R posterior thigh.   OBJECTIVE    TODAY'S TREATMENT  Therapeutic exercise: to centralize symptoms and improve ROM, strength, muscular endurance, and activity tolerance required for successful completion of functional activities.  - NuStep level 6 using bilateral upper and lower extremities. Seat setting 11. For improved extremity mobility, muscular endurance, and activity tolerance; and to induce the analgesic effect of aerobic exercise, stimulate improved joint nutrition, and prepare body structures and systems for  following interventions. 6 min. Average SPM = 79. - standing lumbar extension, 2x5 with table behind buttocks for support, end range pain into R groin, R leg feels odd after, hip started hurting after, worse.  Seated trunk rotation with 3KG ball for thoracolumbar stretch and oblique strengthening. 4x10/side - hooklying low trunk rotation, 1x20 each side.    Manual therapy: to reduce pain and tissue tension, improve range of motion, neuromodulation, in order to promote improved ability to complete functional activities. PRONE with two pillows under abdomen and one under chest.  - STM to bilateral lower thoracic and lumbar musculature, upper glutes, QL (R > L) to decrease pain and tension.  HOOKLYING - STM to right iliacus and lower abdominal muscles and soft tissue to decrease pain and tension in front of abdomen and groin.    Pt required multimodal cuing for proper technique and to facilitate improved neuromuscular control, strength, range of motion, and functional ability resulting in improved performance and form.  HOME EXERCISE PROGRAM:  Access Code: [email or web address] URL: [email or web address] Date: 11/13/2021 Prepared by: Rosita Kea  Exercises - Sit to Stand  - 1 x weekly - 2-3 sets - 5-10 reps - Seated Isometric Hip Adduction with Ball  - 1 x daily - 2-3 sets - 5-10 reps - 5 seconds hold - Seated Toe Raise  - 1 x daily - 2-3 sets - 10 reps - Seated Single Arm Shoulder Row with Anchored Resistance  -  1 x daily - 3 sets - 10 reps  PATIENT EDUCATION:  Education details: Form/technique with exercise.  Person educated: Patient Education method: Explanation, Demonstration, Tactile cues, and Verbal cues Education comprehension: verbalized understanding, returned demonstration, verbal cues required, tactile cues required, and needs further education   ASSESSMENT:  CLINICAL IMPRESSION: Patient arrive with similar pain to last PT session. Short trial of gentle lumbar extension in  standing. Initially felt pulling in R groin only with extension with no worsening otherwise, then reported R leg feeling "odd" progressing to increased hip pain, then urinary urgency with first trip to bathroom unsuccessful. Patient underwent manual therapy to decrease pain with weight bearing on R LE after extension exercise and decrease muscle tension and pain in low back and right hip region. Patinet very tender to palpation over right base of spine, along top of R iliac crest to lower abdomen and front of groin. Pressure to the R iliacus region reproduced sharp pain towards low back and groin and was very tight to palpation. Tension improved with pressure and STM and patient reported improved pain in the right lower abdomen, groin, and thigh after manual therapy. He reported continued pain in the low back and towards the right hip by end of session. He also reported R toe tingling instead of usual numbness by end of session. He had a few moments where he felt he needed his rollator instead of [facility] for ambulation today in the clinic (initially after prone manual) but later felt more comfortable with cane. Prior to lumbar extension he walked with no AD and no stumbles close to wall or objects where he could recover from stumble if it were to occur. Patient's back pain appears to be affecting right hip and pelvic muscles and R LE, is made better by manual therapy and exacerbated by lumbar extension. Plan to continue with functional strengthening and balance exercises as tolerated and use manual therapy as needed for pain control next session. Patient would benefit from continued management of limiting condition by skilled physical therapist to address remaining impairments and functional limitations to work towards stated goals and return to PLOF or maximal functional independence.   OBJECTIVE IMPAIRMENTS Abnormal gait, decreased activity tolerance, decreased balance, decreased coordination, decreased endurance,  decreased knowledge of use of DME, decreased mobility, difficulty walking, decreased ROM, decreased strength, impaired perceived functional ability, impaired flexibility, impaired sensation, impaired tone, impaired UE functional use, postural dysfunction, obesity, and pain.   ACTIVITY LIMITATIONS cleaning, community activity, driving, occupation, laundry, shopping, and basic mobility such as bed mobility, transfers, driving, household and community ambulation, preventing falls, floor transfers, playing with his daughter, stairs, social participation and socializing with friends .   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    REHAB POTENTIAL: Good  CLINICAL DECISION MAKING: Evolving/moderate complexity  EVALUATION COMPLEXITY: Moderate   GOALS: Goals reviewed with patient? No  SHORT TERM GOALS: Target date: 11/15/2021  Be independent with initial home exercise program for self-management of symptoms. Baseline: initial HEP to be provided at visit 2 as appropriate (11/01/2021);  Goal status: MET  LONG TERM GOALS: Target date:  01/24/2022; Updated to 04/09/2022 on 01/15/2022.  Be independent with a long-term home exercise program for self-management of symptoms.  Baseline: Initial HEP to be provided at visit 2 as appropriate (11/01/2021); patient currently participating well (12/06/2021; 01/15/2022; 02/26/2022);  Goal status: IN PROGRESS  2.  Demonstrate improved FOTO to equal or greater than 42 by visit  #14 to demonstrate improvement in overall condition and self-reported functional ability.  Baseline: 26 (11/01/2021); 45 at visit #10 (12/06/2021); 53 at visit# 20 (01/15/2022); 50 at visit #30 (02/26/2022);  Goal status: ACHIEVED 12/06/2021  3.  Patient will complete 5 Time Sit To Stand Test in equal or less than 15 seconds for 18.5 inch surface with no UE support to demonstrate improved ability to complete transfers and decrease fall risk.  Baseline: 45 second from 18.5 inch plinth with R UE support on plinth and L UE support on locked rollator and B UE support on rollator in standing. Multiple attempts needed on some reps (11/01/2021); 21 second from 18.5 inch plinth with B UE support on plinth. Walker positioned in front of him for safety but did not need it. (12/06/2021); 21 second from 18.5 inch plinth with no UE support. Walker positioned in front of him for safety but did not need it (01/15/2022); 15 seconds from 18.5 inch plinth with no UE support. Walker positioned in front of him for safety but did not need it (02/26/2022); Goal status: IN PROGRESS  4.  Patient will ambulate equal or greater than 1200 feet on 6 Minute Walk Test with [facility]  to demonstrate improved household and community mobility as well as community and social participation.  Baseline: to be tested visit 2 as appropriate (11/01/2021); 800 feet with rollator. Low back pain increased to 4-5/10 and had pain down R LE to calf (11/13/2021); 943 feet with SPC and SBA (two little standing breaks to stretch back due to tightness at low back, increased sharper pain in back and B legs, 12/06/2021);  948 feet with SPC (01/09/2022); 1045 feet with SPC no rests  but catches toes on floor at times (01/15/2022); 936 feet with SBA and mostly carrying cane for first 4 min of test. Scuffing feet on floor slightly. No stumbles. Sharp pain developed around 4 min in right glute/hip region worse on initial contact that remained after seated rest from R glute to anterior  thigh to knee and in toes (02/26/2022);  Goal status: IN PROGRESS  5.  Complete community, work and/or recreational activities without limitation due to current condition.  Baseline: difficulty with bed mobility, transfers, driving, household and community ambulation, preventing falls, floor transfers, playing with his daughter, stairs, social participation and socializing with friends (11/01/2021); walking is getting easier, transfers are easier, doesn't have to use his rollator as much, stairs are still troublesome, has not tried floor transfer, still restricted socially and continues to have significant limitations with functional activities and mobility (12/06/2021); walking and transfers are easier and is using [employer] at home instead of rollator, continues to be restricted socially and have significant limitations with functional activities, basic mobility, and balance (01/15/2022); driving is better and he is able to move his foot instead of his whole leg, still has not been able to get to the floor and back up, it is getting a little easier to do things around the house, still hard to lift heavy boxes and lift his daughter, he can stand a little longer to do the dishes, vacuuming the floor is less difficult than before, he continues  to have limitations in socialization and community mobility, still is concerned about falling, continues to have urgency with bowel and bladder (02/26/2022);  Goal status: IN PROGRESS   PLAN: PT FREQUENCY: 2x/week  PT DURATION: 12 weeks  PLANNED INTERVENTIONS: Therapeutic exercises, Therapeutic activity, Neuromuscular re-education, Balance training, Gait training, Patient/Family education, Joint mobilization, Stair training, DME instructions, Dry Needling, and Electrical stimulation.  PLAN FOR NEXT SESSION: Update HEP as tolerated. Progressive LE, core, functional strengthening and balance exercises as tolerated and appropriate.    Everlean Alstrom. Graylon Good, PT, DPT 03/14/22, 7:39  PM  [facility] [address] [place], [place] [address] P: [phone] I F: [phone]

## 2022-03-19 ENCOUNTER — Encounter: Payer: Self-pay | Admitting: Physical Therapy

## 2022-03-19 ENCOUNTER — Ambulatory Visit: Payer: Medicare Other | Admitting: Physical Therapy

## 2022-03-19 DIAGNOSIS — M5459 Other low back pain: Secondary | ICD-10-CM

## 2022-03-19 DIAGNOSIS — R296 Repeated falls: Secondary | ICD-10-CM

## 2022-03-19 DIAGNOSIS — M6281 Muscle weakness (generalized): Secondary | ICD-10-CM

## 2022-03-19 DIAGNOSIS — M546 Pain in thoracic spine: Secondary | ICD-10-CM

## 2022-03-19 DIAGNOSIS — R2689 Other abnormalities of gait and mobility: Secondary | ICD-10-CM

## 2022-03-19 DIAGNOSIS — R262 Difficulty in walking, not elsewhere classified: Secondary | ICD-10-CM

## 2022-03-19 NOTE — Therapy (Signed)
OUTPATIENT PHYSICAL THERAPY TREATMENT NOTE   Patient Name: Travis Fitzpatrick MRN: [id number] DOB:1981/07/06, 41 y.o., male Today's Date: 03/19/22     PT End of Session - 03/19/22 [place]     Visit Number 36    Number of Visits 43    Date for PT Re-Evaluation 04/09/22    Authorization Type MEDICARE PART B reporting period from 01/15/2022    Progress Note Due on Visit 31    PT Start Time 1735    PT Stop Time 1815    PT Time Calculation (min) 40 min    Equipment Utilized During Treatment Gait belt    Activity Tolerance Patient tolerated treatment well    Behavior During Therapy WFL for tasks assessed/performed             Past Medical History:  Diagnosis Date   Bowel trouble    urgency   Medical history non-contributory    Urinary urgency    Past Surgical History:  Procedure Laterality Date   BACK SURGERY  2010   CIRCUMCISION     LUMBAR LAMINECTOMY/DECOMPRESSION MICRODISCECTOMY  07/20/2011   Procedure: LUMBAR LAMINECTOMY/DECOMPRESSION MICRODISCECTOMY;  Surgeon: Winfield Cunas;  Location: [facility];  Service: Neurosurgery;  Laterality: N/A;  right thoracotomy with thoracic eight-nine discectomy and fusion   LUMBAR LAMINECTOMY/DECOMPRESSION MICRODISCECTOMY Right 10/06/2021   Procedure: Right Lumbar Four-Five Microdiscectomy, Right Lumbar Five-Sacal One Foraminotomy;  Surgeon: Ashok Pall, MD;  Location: [facility];  Service: Neurosurgery;  Laterality: Right;  3C/RM 21   THORACIC DISCECTOMY  07/16/2012   Procedure: THORACIC DISCECTOMY;  Surgeon: Winfield Cunas, MD;  Location: [facility];  Service: Neurosurgery;  Laterality: Right;  RIGHT Thoracic seven-eight  thoracic diskectomy via thoracotomy by dr Cyndia Bent   THORACIC DISCECTOMY N/A 12/15/2014   Procedure: THORACIC SEVEN TO THORACIC NINE Laminectomy ;  Surgeon: Ashok Pall, MD;  Location: [facility];  Service: Neurosurgery;  Laterality: N/A;   THORACIC DISCECTOMY N/A 06/06/2017   Procedure: LAMINECTOMY THORACIC NINE-TEN;   Surgeon: Ashok Pall, MD;  Location: [facility];  Service: Neurosurgery;  Laterality: N/A;  LAMINECTOMY THORACIC NINE-TEN   THORACOTOMY  07/20/2011   Procedure: THORACOTOMY OPEN FOR SPINE SURGERY;  Surgeon: Pierre Bali, MD;  Location: [facility];  Service: Vascular;  Laterality: N/A;   THORACOTOMY  07/16/2012   Procedure: THORACOTOMY OPEN FOR SPINE SURGERY;  Surgeon: Gaye Pollack, MD;  Location: [facility];  Service: Thoracic;  Laterality: N/A;   Patient Active Problem List   Diagnosis Date Noted   HNP (herniated nucleus pulposus), lumbar 10/06/2021   Hand pain 06/28/2021   Lumbar facet arthropathy 01/11/2021   Thoracic spondylosis with myelopathy 01/11/2021   Thoracic spinal stenosis 06/06/2017   Stenosis, spinal, thoracic 12/15/2014   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 09/22/2014   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/2011    PCP: [place] [facility]  REFERRING PROVIDER: Ashok Pall, MD  REFERRING DIAG: 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  PERTINENT HISTORY: Patient is a 41 y.o. male who presents to outpatient physical therapy with a referral  for medical diagnosis  intervertebral disc disorder of thoracic region with myelopathy. This patient's chief complaints consist of low back pain, R LE pain/paresthesia/weakness, L LE weakness s/p right L4-5 laminotomy, microdiscectomy on 10/06/2021 leading to the following functional deficits: difficulty with bed mobility, transfers,  driving, household and community ambulation, preventing falls, floor transfers, playing with his daughter, stairs, social participation and socializing with friends. 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    Patient denies hx of cancer, stroke, seizures, lung problems, heart problems, diabetes, unexplained weight loss, and osteoporosis. Patient reports he has difficulty with making it to the bathroom on time when he gets bowel or bladder urges and has stumbling. He has history of thoracic and lumbar surgeries.  PRECAUTIONS: falls. No post-op restrictions after 4 months from surgery.   PATIENT GOALS: get better and get off the walker to walk with [facility], play with his daughter (15 months), get his strength back, be able to get up and down from floor.  SUBJECTIVE: Patient arrives with rollator. States he still has pain in his low back and down the right posterior thigh to the knee. He also continues to have pain in the right hip and groin region. He went shopping on Friday and it went pretty well. He felt better after last PT session and he felt the manual therapy helped him feel better. HEP is going well.    PAIN:  Are you having pain? 2/10 Location: surgical site at low back to right groin and R posterior thigh.   OBJECTIVE    TODAY'S TREATMENT  Therapeutic exercise: to centralize symptoms and improve ROM, strength, muscular endurance, and activity tolerance required for successful completion of functional activities.  - NuStep level 6 using bilateral upper and lower extremities. Seat setting 11. For improved extremity mobility, muscular  endurance, and activity tolerance; and to induce the analgesic effect of aerobic exercise, stimulate improved joint nutrition, and prepare body structures and systems for following interventions. 6 min. Average SPM = 79.  Neuromuscular Re-education: to improve, balance, postural strength, muscle activation patterns, and stabilization strength required for functional activities: - lateral stepping on airex beam: 1x5 each direction with B UE support as needed. CGA-SBA - Tandem ambulation forwards/backwards on airex beam: x5 each direction. CGA-SBA. Intermittent light UE support - standing lateral ball toss with trunk rotation while standing on airex pad and using 3 Kg ball tosses. 3x20 each directions. SBA-CGA.  hip/ankle strategy with additional BUE support needed to correct LOB (chair placed in front and back for safety).    Manual therapy: to reduce pain and tissue tension, improve range of motion, neuromodulation, in order to promote improved ability to complete functional activities. PRONE with two pillows under abdomen and one under chest.  - STM to bilateral lower thoracic and lumbar musculature, upper glutes, QL (R > L) to decrease pain and tension.    Pt required multimodal cuing for proper technique and to facilitate improved neuromuscular control, strength, range of motion, and functional ability resulting in improved performance and form.  HOME EXERCISE PROGRAM:  Access Code: [email or web address] URL: [email or web address] Date: 11/13/2021 Prepared by: Rosita Kea  Exercises - Sit to Stand  - 1 x weekly - 2-3 sets -  5-10 reps - Seated Isometric Hip Adduction with Ball  - 1 x daily - 2-3 sets - 5-10 reps - 5 seconds hold - Seated Toe Raise  - 1 x daily - 2-3 sets - 10 reps - Seated Single Arm Shoulder Row with Anchored Resistance  - 1 x daily - 3 sets - 10 reps  PATIENT EDUCATION:  Education details: Form/technique with exercise.  Person educated: Patient Education method:  Explanation, Demonstration, Tactile cues, and Verbal cues Education comprehension: verbalized understanding, returned demonstration, verbal cues required, tactile cues required, and needs further education   ASSESSMENT:  CLINICAL IMPRESSION: Patient arrives with lower levels of pain in same pattern as has been usual recently. Needed restroom break after first set of side stepping on airex beam and afterwards had improved LE coordination and balance. Complained of tightness in the right calf with beam exercises. Very tender to palpation and tight at right glute region. Pain in right glute referred towards front of pelvis and would benefit from further soft tissue work to this region at next session along with continued balance and functional activities. Patient demonstrated improved balance and confidence with balance activities today but is still highly challenged with balance and functional mobility. Patient would benefit from continued management of limiting condition by skilled physical therapist to address remaining impairments and functional limitations to work towards stated goals and return to PLOF or maximal functional independence.   OBJECTIVE IMPAIRMENTS Abnormal gait, decreased activity tolerance, decreased balance, decreased coordination, decreased endurance, decreased knowledge of use of DME, decreased mobility, difficulty walking, decreased ROM, decreased strength, impaired perceived functional ability, impaired flexibility, impaired sensation, impaired tone, impaired UE functional use, postural dysfunction, obesity, and pain.   ACTIVITY LIMITATIONS cleaning, community activity, driving, occupation, laundry, shopping, and basic mobility such as bed mobility, transfers, driving, household and community ambulation, preventing falls, floor transfers, playing with his daughter, stairs, social participation and socializing with friends .   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    REHAB POTENTIAL: Good  CLINICAL DECISION MAKING: Evolving/moderate complexity  EVALUATION COMPLEXITY: Moderate   GOALS: Goals reviewed with patient? No  SHORT TERM GOALS: Target date: 11/15/2021  Be independent with initial home exercise program for self-management of symptoms. Baseline: initial HEP to be provided at visit 2 as appropriate (11/01/2021);  Goal status: MET  LONG TERM GOALS: Target date: 01/24/2022; Updated to 04/09/2022 on 01/15/2022.  Be independent with a long-term home exercise program for self-management of symptoms.  Baseline: Initial HEP to be provided at visit 2 as appropriate (11/01/2021); patient currently participating well (12/06/2021; 01/15/2022; 02/26/2022);  Goal status: IN PROGRESS  2.  Demonstrate improved FOTO to equal or greater than 42 by visit #14 to demonstrate improvement in overall condition and self-reported functional ability.  Baseline: 26 (11/01/2021); 45 at visit #10 (12/06/2021); 53 at visit# 20 (01/15/2022); 50 at visit #30 (02/26/2022);  Goal status: ACHIEVED 12/06/2021  3.  Patient will complete 5 Time Sit To Stand Test in equal or less than 15 seconds for 18.5 inch surface with no UE support to demonstrate improved ability to complete transfers and  decrease fall risk.  Baseline: 45 second from 18.5 inch plinth with R UE support on plinth and L UE support on locked rollator and B UE support on rollator in standing. Multiple attempts needed on some reps (11/01/2021);  21 second from 18.5 inch plinth with B UE support on plinth. Walker positioned in front of him for safety but did not need it. (12/06/2021); 21 second from 18.5 inch plinth with no UE support. Walker positioned in front of him for safety but did not need it (01/15/2022); 15 seconds from 18.5 inch plinth with no UE support. Walker positioned in front of him for safety but did not need it (02/26/2022); Goal status: IN PROGRESS  4.  Patient will ambulate equal or greater than 1200 feet on 6 Minute Walk Test with [facility]  to demonstrate improved household and community mobility as well as community and social participation.  Baseline: to be tested visit 2 as appropriate (11/01/2021); 800 feet with rollator. Low back pain increased to 4-5/10 and had pain down R LE to calf (11/13/2021); 943 feet with SPC and SBA (two little standing breaks to stretch back due to tightness at low back, increased sharper pain in back and B legs, 12/06/2021);  948 feet with SPC (01/09/2022); 1045 feet with SPC no rests  but catches toes on floor at times (01/15/2022); 936 feet with SBA and mostly carrying cane for first 4 min of test. Scuffing feet on floor slightly. No stumbles. Sharp pain developed around 4 min in right glute/hip region worse on initial contact that remained after seated rest from R glute to anterior thigh to knee and in toes (02/26/2022);  Goal status: IN PROGRESS  5.  Complete community, work and/or recreational activities without limitation due to current condition.  Baseline: difficulty with bed mobility, transfers, driving, household and community ambulation, preventing falls, floor transfers, playing with his daughter, stairs, social participation and socializing with friends (11/01/2021); walking is getting easier, transfers are easier, doesn't have to use his rollator as much, stairs are still troublesome, has not tried floor transfer, still restricted socially and continues to have significant limitations with functional  activities and mobility (12/06/2021); walking and transfers are easier and is using [place] at home instead of rollator, continues to be restricted socially and have significant limitations with functional activities, basic mobility, and balance (01/15/2022); driving is better and he is able to move his foot instead of his whole leg, still has not been able to get to the floor and back up, it is getting a little easier to do things around the house, still hard to lift heavy boxes and lift his daughter, he can stand a little longer to do the dishes, vacuuming the floor is less difficult than before, he continues to have limitations in socialization and community mobility, still is concerned about falling, continues to have urgency with bowel and bladder (02/26/2022);  Goal status: IN PROGRESS   PLAN: PT FREQUENCY: 2x/week  PT DURATION: 12 weeks  PLANNED INTERVENTIONS: Therapeutic exercises, Therapeutic activity, Neuromuscular re-education, Balance training, Gait training, Patient/Family education, Joint mobilization, Stair training, DME instructions, Dry Needling, and Electrical stimulation.  PLAN FOR NEXT SESSION: Update HEP as tolerated. Progressive LE, core, functional strengthening and balance exercises as tolerated and appropriate.    Everlean Alstrom. Graylon Good, PT, DPT 03/19/22, 6:37 PM  [facility] [address] [place], [place] [address] P: [phone] I F: [phone]

## 2022-03-21 ENCOUNTER — Ambulatory Visit: Payer: Medicare Other | Admitting: Physical Therapy

## 2022-03-21 ENCOUNTER — Encounter: Payer: Self-pay | Admitting: Physical Therapy

## 2022-03-21 DIAGNOSIS — R296 Repeated falls: Secondary | ICD-10-CM

## 2022-03-21 DIAGNOSIS — R2689 Other abnormalities of gait and mobility: Secondary | ICD-10-CM

## 2022-03-21 DIAGNOSIS — R262 Difficulty in walking, not elsewhere classified: Secondary | ICD-10-CM

## 2022-03-21 DIAGNOSIS — M5459 Other low back pain: Secondary | ICD-10-CM

## 2022-03-21 DIAGNOSIS — M546 Pain in thoracic spine: Secondary | ICD-10-CM

## 2022-03-21 DIAGNOSIS — M6281 Muscle weakness (generalized): Secondary | ICD-10-CM

## 2022-03-21 NOTE — Therapy (Signed)
OUTPATIENT PHYSICAL THERAPY TREATMENT NOTE   Patient Name: Travis Fitzpatrick MRN: [id number] DOB:1980-09-06, 41 y.o., male Today's Date: 03/21/22     PT End of Session - 03/21/22 1820     Visit Number 37    Number of Visits 43    Date for PT Re-Evaluation 04/09/22    Authorization Type MEDICARE PART B reporting period from 01/15/2022    Progress Note Due on Visit 40    PT Start Time 1820    PT Stop Time 1900    PT Time Calculation (min) 40 min    Equipment Utilized During Treatment Gait belt    Activity Tolerance Patient tolerated treatment well    Behavior During Therapy WFL for tasks assessed/performed              Past Medical History:  Diagnosis Date   Bowel trouble    urgency   Medical history non-contributory    Urinary urgency    Past Surgical History:  Procedure Laterality Date   BACK SURGERY  2010   CIRCUMCISION     LUMBAR LAMINECTOMY/DECOMPRESSION MICRODISCECTOMY  07/20/2011   Procedure: LUMBAR LAMINECTOMY/DECOMPRESSION MICRODISCECTOMY;  Surgeon: Winfield Cunas;  Location: [place];  Service: Neurosurgery;  Laterality: N/A;  right thoracotomy with thoracic eight-nine discectomy and fusion   LUMBAR LAMINECTOMY/DECOMPRESSION MICRODISCECTOMY Right 10/06/2021   Procedure: Right Lumbar Four-Five Microdiscectomy, Right Lumbar Five-Sacal One Foraminotomy;  Surgeon: Ashok Pall, MD;  Location: [facility];  Service: Neurosurgery;  Laterality: Right;  3C/RM 21   THORACIC DISCECTOMY  07/16/2012   Procedure: THORACIC DISCECTOMY;  Surgeon: Winfield Cunas, MD;  Location: [place] [facility];  Service: Neurosurgery;  Laterality: Right;  RIGHT Thoracic seven-eight  thoracic diskectomy via thoracotomy by dr Cyndia Bent   THORACIC DISCECTOMY N/A 12/15/2014   Procedure: THORACIC SEVEN TO THORACIC NINE Laminectomy ;  Surgeon: Ashok Pall, MD;  Location: [facility];  Service: Neurosurgery;  Laterality: N/A;   THORACIC DISCECTOMY N/A 06/06/2017   Procedure: LAMINECTOMY THORACIC NINE-TEN;   Surgeon: Ashok Pall, MD;  Location: [facility];  Service: Neurosurgery;  Laterality: N/A;  LAMINECTOMY THORACIC NINE-TEN   THORACOTOMY  07/20/2011   Procedure: THORACOTOMY OPEN FOR SPINE SURGERY;  Surgeon: Pierre Bali, MD;  Location: [facility];  Service: Vascular;  Laterality: N/A;   THORACOTOMY  07/16/2012   Procedure: THORACOTOMY OPEN FOR SPINE SURGERY;  Surgeon: Gaye Pollack, MD;  Location: [facility];  Service: Thoracic;  Laterality: N/A;   Patient Active Problem List   Diagnosis Date Noted   HNP (herniated nucleus pulposus), lumbar 10/06/2021   Hand pain 06/28/2021   Lumbar facet arthropathy 01/11/2021   Thoracic spondylosis with myelopathy 01/11/2021   Thoracic spinal stenosis 06/06/2017   Stenosis, spinal, thoracic 12/15/2014   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 09/22/2014   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/2011    PCP: [place] [facility]  REFERRING PROVIDER: Ashok Pall, MD  REFERRING DIAG: 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  PERTINENT HISTORY: Patient is a 41 y.o. male who presents to outpatient physical therapy with a  referral for medical diagnosis  intervertebral disc disorder of thoracic region with myelopathy. This patient's chief complaints consist of low back pain, R LE pain/paresthesia/weakness, L LE weakness s/p right L4-5 laminotomy, microdiscectomy on 10/06/2021 leading to the following functional deficits: difficulty with bed mobility, transfers,  driving, household and community ambulation, preventing falls, floor transfers, playing with his daughter, stairs, social participation and socializing with friends. 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    Patient denies hx of cancer, stroke, seizures, lung problems, heart problems, diabetes, unexplained weight loss, and osteoporosis. Patient reports he has difficulty with making it to the bathroom on time when he gets bowel or bladder urges and has stumbling. He has history of thoracic and lumbar surgeries.  PRECAUTIONS: falls. No post-op restrictions after 4 months from surgery.   PATIENT GOALS: get better and get off the walker to walk with [facility], play with his daughter (15 months), get his strength back, be able to get up and down from floor.  SUBJECTIVE: Patient arrives with rollator. States he still has pain in his low back and down the right posterior thigh to the knee. He also continues to have pain in the right hip and groin region. He went shopping on Friday and it went pretty well. He felt better after last PT session and he felt the manual therapy helped him feel better. HEP is going well.    PAIN:  Are you having pain? 2/10 Location: surgical site at low back to right groin and R posterior thigh.   OBJECTIVE    TODAY'S TREATMENT  Therapeutic exercise: to centralize symptoms and improve ROM, strength, muscular endurance, and activity tolerance required for successful completion of functional activities.  - NuStep level 6 using bilateral upper and lower extremities. Seat setting 11. For improved extremity mobility, muscular  endurance, and activity tolerance; and to induce the analgesic effect of aerobic exercise, stimulate improved joint nutrition, and prepare body structures and systems for following interventions. 6 min. Average SPM = 81. - sit <> stand from 21 inch plinth while holding 15kg slam ball at chest. 3x10. Occasional failed reps on 2nd and 3rd sets.   Neuromuscular Re-education: to improve, balance, postural strength, muscle activation patterns, and stabilization strength required for functional activities: - forwards/backwards step over 6 inch hurdle with U UE support on SPC and occasional UE support on TM bar after LOB. 1x10 alternating leading foot.  - lateral step over 6 inch hurdle with touchdown UE support on TM bar with LOB, 1x10 each direction. CGA. Double steps most reps.  - forwards/backwards step over 6 inch hurdle with U UE support on SPC and occasional U UE support on TM bar, 1x10 leading with each foot each direction.   Pt required multimodal cuing for proper technique and to facilitate improved neuromuscular control, strength, range of motion, and functional ability resulting in improved performance and form.  HOME EXERCISE PROGRAM:  Access Code: [email or web address] URL: [email or web address] Date: 11/13/2021 Prepared by: Rosita Kea  Exercises - Sit to Stand  - 1 x weekly - 2-3 sets - 5-10 reps - Seated Isometric Hip Adduction with Ball  - 1 x daily - 2-3 sets - 5-10 reps - 5 seconds hold -  Seated Toe Raise  - 1 x daily - 2-3 sets - 10 reps - Seated Single Arm Shoulder Row with Anchored Resistance  - 1 x daily - 3 sets - 10 reps  PATIENT EDUCATION:  Education details: Form/technique with exercise.  Person educated: Patient Education method: Explanation, Demonstration, Tactile cues, and Verbal cues Education comprehension: verbalized understanding, returned demonstration, verbal cues required, tactile cues required, and needs further education   ASSESSMENT:  CLINICAL  IMPRESSION: Patient tolerated treatment well with no increase in pain by end of session. Initiating exercise provoked need to use restroom again this session. Session focused on improving standing tolerance and functional balance and strength for stepping activities and sit<> stand. Patient struggles to clear 6 inch hurdle with L > R foot due to hip flexor weakness. He was able to perform sit <> stands form 21 inch surface while holding 15lb at his chest but was not strong enough to stand with that weight from lower surface. Patient continues to demonstrate improving functional ability with decreased pain but is still very limited. Pain and weakness in R LE is more limiting that back pain or left LE weakness. Inability to feel either foot also limits somatic feedback for balance and mobility. Patient would benefit from continued management of limiting condition by skilled physical therapist to address remaining impairments and functional limitations to work towards stated goals and return to PLOF or maximal functional independence.   OBJECTIVE IMPAIRMENTS Abnormal gait, decreased activity tolerance, decreased balance, decreased coordination, decreased endurance, decreased knowledge of use of DME, decreased mobility, difficulty walking, decreased ROM, decreased strength, impaired perceived functional ability, impaired flexibility, impaired sensation, impaired tone, impaired UE functional use, postural dysfunction, obesity, and pain.   ACTIVITY LIMITATIONS cleaning, community activity, driving, occupation, laundry, shopping, and basic mobility such as bed mobility, transfers, driving, household and community ambulation, preventing falls, floor transfers, playing with his daughter, stairs, social participation and socializing with friends .   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    REHAB POTENTIAL: Good  CLINICAL DECISION MAKING: Evolving/moderate complexity  EVALUATION COMPLEXITY: Moderate   GOALS: Goals reviewed with patient? No  SHORT TERM GOALS: Target date: 11/15/2021  Be independent with initial home exercise program for self-management of symptoms. Baseline: initial HEP to be provided at visit 2 as appropriate (11/01/2021);  Goal status: MET  LONG TERM GOALS: Target date: 01/24/2022; Updated to 04/09/2022 on 01/15/2022.  Be independent with a long-term home exercise program for self-management of symptoms.  Baseline: Initial HEP to be provided at visit 2 as appropriate (11/01/2021); patient currently participating well (12/06/2021; 01/15/2022; 02/26/2022);  Goal status: IN PROGRESS  2.  Demonstrate improved FOTO to equal or greater than 42 by visit #14 to demonstrate improvement in overall condition and self-reported functional ability.  Baseline: 26 (11/01/2021); 45 at visit #10 (12/06/2021); 53 at visit# 20 (01/15/2022); 50 at visit #30 (02/26/2022);  Goal status: ACHIEVED 12/06/2021  3.  Patient will complete 5 Time Sit To Stand Test in equal or less than 15 seconds for 18.5 inch surface with no UE support to demonstrate improved ability to complete  transfers and decrease fall risk.  Baseline: 45 second from 18.5 inch plinth with R UE support on plinth and L UE support on locked rollator and B UE support on rollator in standing. Multiple attempts needed on some reps (11/01/2021); 21 second from 18.5 inch plinth with B UE support on plinth. Walker positioned in front of him for safety but did not  need it. (12/06/2021); 21 second from 18.5 inch plinth with no UE support. Walker positioned in front of him for safety but did not need it (01/15/2022); 15 seconds from 18.5 inch plinth with no UE support. Walker positioned in front of him for safety but did not need it (02/26/2022); Goal status: IN PROGRESS  4.  Patient will ambulate equal or greater than 1200 feet on 6 Minute Walk Test with [facility]  to demonstrate improved household and community mobility as well as community and social participation.  Baseline: to be tested visit 2 as appropriate (11/01/2021); 800 feet with rollator. Low back pain increased to 4-5/10 and had pain down R LE to calf (11/13/2021); 943 feet with SPC and SBA (two little standing breaks to stretch back due to tightness at low back, increased sharper pain in back and B legs, 12/06/2021);  948 feet with SPC (01/09/2022); 1045 feet with SPC no rests  but catches toes on floor at times (01/15/2022); 936 feet with SBA and mostly carrying cane for first 4 min of test. Scuffing feet on floor slightly. No stumbles. Sharp pain developed around 4 min in right glute/hip region worse on initial contact that remained after seated rest from R glute to anterior thigh to knee and in toes (02/26/2022);  Goal status: IN PROGRESS  5.  Complete community, work and/or recreational activities without limitation due to current condition.  Baseline: difficulty with bed mobility, transfers, driving, household and community ambulation, preventing falls, floor transfers, playing with his daughter, stairs, social participation and socializing with friends (11/01/2021); walking is getting easier, transfers are easier, doesn't have to use his rollator as much, stairs are still troublesome, has not tried floor transfer, still restricted socially and continues to have significant limitations with functional activities and mobility (12/06/2021); walking and transfers are easier and is using [place] at home instead of rollator,  continues to be restricted socially and have significant limitations with functional activities, basic mobility, and balance (01/15/2022); driving is better and he is able to move his foot instead of his whole leg, still has not been able to get to the floor and back up, it is getting a little easier to do things around the house, still hard to lift heavy boxes and lift his daughter, he can stand a little longer to do the dishes, vacuuming the floor is less difficult than before, he continues to have limitations in socialization and community mobility, still is concerned about falling, continues to have urgency with bowel and bladder (02/26/2022);  Goal status: IN PROGRESS   PLAN: PT FREQUENCY: 2x/week  PT DURATION: 12 weeks  PLANNED INTERVENTIONS: Therapeutic exercises, Therapeutic activity, Neuromuscular re-education, Balance training, Gait training, Patient/Family education, Joint mobilization, Stair training, DME instructions, Dry Needling, and Electrical stimulation.  PLAN FOR NEXT SESSION: Update HEP as tolerated. Progressive LE, core, functional strengthening and balance exercises as tolerated and appropriate.    Everlean Alstrom. Graylon Good, PT, DPT 03/21/22, 7:19 PM  [facility] [address] [place], [place] [address] P: [phone] I F: [phone]

## 2022-03-26 ENCOUNTER — Ambulatory Visit: Payer: Medicare Other | Admitting: Physical Therapy

## 2022-03-26 ENCOUNTER — Encounter: Payer: Self-pay | Admitting: Physical Therapy

## 2022-03-26 DIAGNOSIS — R296 Repeated falls: Secondary | ICD-10-CM

## 2022-03-26 DIAGNOSIS — M5459 Other low back pain: Secondary | ICD-10-CM | POA: Diagnosis not present

## 2022-03-26 DIAGNOSIS — M546 Pain in thoracic spine: Secondary | ICD-10-CM

## 2022-03-26 DIAGNOSIS — M6281 Muscle weakness (generalized): Secondary | ICD-10-CM

## 2022-03-26 DIAGNOSIS — R2689 Other abnormalities of gait and mobility: Secondary | ICD-10-CM

## 2022-03-26 DIAGNOSIS — R262 Difficulty in walking, not elsewhere classified: Secondary | ICD-10-CM

## 2022-03-26 NOTE — Therapy (Signed)
OUTPATIENT PHYSICAL THERAPY TREATMENT NOTE   Patient Name: Travis Fitzpatrick MRN: [id number] DOB:[birth year], 41 y.o., male Today's Date: 03/26/22     PT End of Session - 03/26/22 1825     Visit Number 38    Number of Visits 43    Date for PT Re-Evaluation 04/09/22    Authorization Type MEDICARE PART B reporting period from 01/15/2022    Progress Note Due on Visit 40    PT Start Time 1823    PT Stop Time 1901    PT Time Calculation (min) 38 min    Equipment Utilized During Treatment Gait belt    Activity Tolerance Patient tolerated treatment well    Behavior During Therapy WFL for tasks assessed/performed               Past Medical History:  Diagnosis Date   Bowel trouble    urgency   Medical history non-contributory    Urinary urgency    Past Surgical History:  Procedure Laterality Date   BACK SURGERY  2010   CIRCUMCISION     LUMBAR LAMINECTOMY/DECOMPRESSION MICRODISCECTOMY  07/20/2011   Procedure: LUMBAR LAMINECTOMY/DECOMPRESSION MICRODISCECTOMY;  Surgeon: Winfield Cunas;  Location: [facility];  Service: Neurosurgery;  Laterality: N/A;  right thoracotomy with thoracic eight-nine discectomy and fusion   LUMBAR LAMINECTOMY/DECOMPRESSION MICRODISCECTOMY Right 10/06/2021   Procedure: Right Lumbar Four-Five Microdiscectomy, Right Lumbar Five-Sacal One Foraminotomy;  Surgeon: Ashok Pall, MD;  Location: [facility];  Service: Neurosurgery;  Laterality: Right;  3C/RM 21   THORACIC DISCECTOMY  07/16/2012   Procedure: THORACIC DISCECTOMY;  Surgeon: Winfield Cunas, MD;  Location: [facility];  Service: Neurosurgery;  Laterality: Right;  RIGHT Thoracic seven-eight  thoracic diskectomy via thoracotomy by dr Cyndia Bent   THORACIC DISCECTOMY N/A 12/15/2014   Procedure: THORACIC SEVEN TO THORACIC NINE Laminectomy ;  Surgeon: Ashok Pall, MD;  Location: [facility];  Service: Neurosurgery;  Laterality: N/A;   THORACIC DISCECTOMY N/A 06/06/2017   Procedure: LAMINECTOMY THORACIC NINE-TEN;   Surgeon: Ashok Pall, MD;  Location: [facility];  Service: Neurosurgery;  Laterality: N/A;  LAMINECTOMY THORACIC NINE-TEN   THORACOTOMY  07/20/2011   Procedure: THORACOTOMY OPEN FOR SPINE SURGERY;  Surgeon: Pierre Bali, MD;  Location: [facility];  Service: Vascular;  Laterality: N/A;   THORACOTOMY  07/16/2012   Procedure: THORACOTOMY OPEN FOR SPINE SURGERY;  Surgeon: Gaye Pollack, MD;  Location: [facility];  Service: Thoracic;  Laterality: N/A;   Patient Active Problem List   Diagnosis Date Noted   HNP (herniated nucleus pulposus), lumbar 10/06/2021   Hand pain 06/28/2021   Lumbar facet arthropathy 01/11/2021   Thoracic spondylosis with myelopathy 01/11/2021   Thoracic spinal stenosis 06/06/2017   Stenosis, spinal, thoracic 12/15/2014   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 09/22/2014   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/2011    PCP: [place] @ [facility]  REFERRING PROVIDER: Ashok Pall, MD  REFERRING DIAG: 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  PERTINENT HISTORY: Patient is a 41 y.o. male who presents to outpatient physical therapy with  a referral for medical diagnosis  intervertebral disc disorder of thoracic region with myelopathy. This patient's chief complaints consist of low back pain, R LE pain/paresthesia/weakness, L LE weakness s/p right L4-5 laminotomy, microdiscectomy on 10/06/2021 leading to the following functional deficits: difficulty with bed mobility, transfers,  driving, household and community ambulation, preventing falls, floor transfers, playing with his daughter, stairs, social participation and socializing with friends. 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    Patient denies hx of cancer, stroke, seizures, lung problems, heart problems, diabetes, unexplained weight loss, and osteoporosis. Patient reports he has difficulty with making it to the bathroom on time when he gets bowel or bladder urges and has stumbling. He has history of thoracic and lumbar surgeries.  PRECAUTIONS: falls. No post-op restrictions after 4 months from surgery.   PATIENT GOALS: get better and get off the walker to walk with [facility], play with his daughter (DOB 07/04/2020), get his strength back, be able to get up and down from floor.  SUBJECTIVE: Patient arrives with rollator. States he continues to have pain in the usual places. He states he was sore and hurting more after last PT session. It went up to 4/10 for a day and a half. Balance has been getting better. Every once in a while when he has to use the bathroom his balance is wobbly.    PAIN:  Are you having pain? 2/10 Location: surgical site at low back to right groin and R posterior thigh.   OBJECTIVE    TODAY'S TREATMENT  Therapeutic exercise: to centralize symptoms and improve ROM, strength, muscular endurance, and activity tolerance required for successful completion of functional activities.  - NuStep level 6 using bilateral upper and lower extremities. Seat setting 11. For improved extremity mobility, muscular endurance, and activity tolerance; and to induce the  analgesic effect of aerobic exercise, stimulate improved joint nutrition, and prepare body structures and systems for following interventions. 6 min. Average SPM = 81. - sit <> stand from 21 inch plinth while holding 10lb slam DB at chest. 3x10.   Neuromuscular Re-education: to improve, balance, postural strength, muscle activation patterns, and stabilization strength required for functional activities:  Limits of stability exercise, feet set with heels at C5 and C15 Trial 1: medium difficulty, 50% in 1 min.  Trial 2: medium difficulty, 53% in 49 seconds Trial 3: medium difficulty, 40 in 1:03 min.   Balance task using "Clock Yourself" app set to 60 steps per minute, full watch face, CGA and near objects that could be used for UE support in case of LOB with stumble.  - 30 seconds with with occasional LOB that he was able to recover from using ankle/hip/step strategy. Occasionally falls behind tempo. - 2 min with occasional LOB that he was able to recover from using ankle/hip/step strategy. Occasionally falls behind tempo.  - 16 seconds then suddenly one or both legs buckled and he fell forwards to quadruped position with fall slowed by support at gait belt by PT, resulting in no hard impact.   Patient's R ankle examined for pain and instability after patient said he felt right ankle gave out. Negative for pain or structural instability.   Floor to stand transfer  from long sitting to standing moving through quadruped and using chair to pull up on with B UE. SBA for safety.   lateral step over 6 inch hurdle with B UE support attempting to decrease UE support TM bar with LOB, 1x5 each direction. CGA. More difficulty than last session.   - ambulation ~ 100 feet with rollator from clinic to vehicle with SBA  and supervision during loading of rollator and walking to diver's seat for safety.   Pt required multimodal cuing for proper technique and to facilitate improved neuromuscular control, strength,  range of motion, and functional ability resulting in improved performance and form.  HOME EXERCISE PROGRAM:  Access Code: [email or web address] URL: [email or web address] Date: 11/13/2021 Prepared by: Rosita Kea  Exercises - Sit to Stand  - 1 x weekly - 2-3 sets - 5-10 reps - Seated Isometric Hip Adduction with Ball  - 1 x daily - 2-3 sets - 5-10 reps - 5 seconds hold - Seated Toe Raise  - 1 x daily - 2-3 sets - 10 reps - Seated Single Arm Shoulder Row with Anchored Resistance  - 1 x daily - 3 sets - 10 reps  PATIENT EDUCATION:  Education details: Form/technique with exercise. Pelvic floor PT Person educated: Patient Education method: Explanation, Demonstration, Tactile cues, and Verbal cues Education comprehension: verbalized understanding, returned demonstration, verbal cues required, tactile cues required, and needs further education   ASSESSMENT:  CLINICAL IMPRESSION: Patient arrives reporting usual pain pattern and weakness. PT filled out and faxed PT order for pelvic floor PT to referring physician to sign if he agreed due to continued reports of pelvic pain, incontinence and urgency issues, and association with urgency and LE weakness that would likely benefit from pelvic floor specialized PT in addition current PT POC. Initial exercises provoked visit to the bathroom as usual and session continued to focus on various aspects of balance and functional mobility. Patient sustained a fall with descent to the ground dampened by PT's support at gait belt and his own effort, so that he did not have hard impact on the floor. One or both of his legs suddenly collapsed without warning, and he fell forwards to quadruped position.  Patient felt twisting his right ankle started the fall, but PT thought his left knee buckled. Patient reports this is a good example of what happens to him at home when he is having bowel/bladder urgency that affects his R LE. Patient's R ankle was checked for pain  or new instability by PT and was found to be not painful and at baseline. Patient got up with supervision from PT using quadruped to chair to pull up technique. After the fall he had no change in pain and did not appear to have any injury from fall. He continued balance exercises during the remainder of PT session but with B UE support since after the fall he reported he had been experiencing increased LE instability due to bowel/bladder urgency today. This incident illustrates why patient may benefit from pelvic floor PT assessment. Plan to continue working on interventions for balance and functional mobility with increased communication about patient's current state with feeling of urgency and weakness in BLE. Patient would benefit from continued management of limiting condition by skilled physical therapist to address remaining impairments and functional limitations to work towards stated goals and return to PLOF or maximal functional independence.   OBJECTIVE IMPAIRMENTS Abnormal gait, decreased activity tolerance, decreased balance, decreased coordination, decreased endurance, decreased knowledge of use of DME, decreased mobility,  difficulty walking, decreased ROM, decreased strength, impaired perceived functional ability, impaired flexibility, impaired sensation, impaired tone, impaired UE functional use, postural dysfunction, obesity, and pain.   ACTIVITY LIMITATIONS cleaning, community activity, driving, occupation, laundry, shopping, and basic mobility such as bed mobility, transfers, driving, household and community ambulation, preventing falls, floor transfers, playing with his daughter, stairs, social participation and socializing with friends .   PERSONAL FACTORS Fitness, Past/current experiences, Profession, Social background, Time since onset of injury/illness/exacerbation, and 3+ comorbidities: 3 thoracic spine surgeries, thoracic MRI notes "Myelomalacia with severe cord atrophy from T7 through  T9-10 and mild decreased volume the remainder of the thoracic cord, stable" s/p right L4-5 laminotomy, microdiscectomy on 10/06/2021, history of pressure to cauda equina, thoracic disc disease with myelopathy, hand pain, urinary and bowel urgency  are also affecting patient's functional outcome.    REHAB POTENTIAL: Good  CLINICAL DECISION MAKING: Evolving/moderate complexity  EVALUATION COMPLEXITY: Moderate   GOALS: Goals reviewed with patient? No  SHORT TERM GOALS: Target date: 11/15/2021  Be independent with initial home exercise program for self-management of symptoms. Baseline: initial HEP to be provided at visit 2 as appropriate (11/01/2021);  Goal status: MET  LONG TERM GOALS: Target date: 01/24/2022; Updated to 04/09/2022 on 01/15/2022.  Be independent with a long-term home exercise program for self-management of symptoms.  Baseline: Initial HEP to be provided at visit 2 as appropriate (11/01/2021); patient currently participating well (12/06/2021; 01/15/2022; 02/26/2022);  Goal status: IN PROGRESS  2.  Demonstrate improved FOTO to equal or greater than 42 by visit #14 to demonstrate improvement in overall condition and self-reported functional ability.  Baseline: 26 (11/01/2021); 45 at visit #10 (12/06/2021); 53 at visit# 20 (01/15/2022); 50 at visit #30 (02/26/2022);  Goal status: ACHIEVED 12/06/2021  3.  Patient will complete 5 Time Sit To Stand Test in equal or less than 15 seconds for 18.5 inch surface with no UE support to demonstrate improved ability to complete transfers and decrease fall risk.  Baseline: 45 second from 18.5 inch plinth with R UE support on plinth and L UE support on locked rollator and B UE support on rollator in standing. Multiple attempts needed on some reps (11/01/2021); 21 second from 18.5 inch plinth with B UE support on plinth. Walker positioned in front of him for safety but did not need it. (12/06/2021); 21 second from 18.5 inch plinth with no UE support. Walker  positioned in front of him for safety but did not need it (01/15/2022); 15 seconds from 18.5 inch plinth with no UE support. Walker positioned in front of him for safety but did not need it (02/26/2022); Goal status: IN PROGRESS  4.  Patient will ambulate equal or greater than 1200 feet on 6 Minute Walk Test with [facility]  to demonstrate improved household and community mobility as well as community and social participation.  Baseline: to be tested visit 2 as appropriate (11/01/2021); 800 feet with rollator. Low back pain increased to 4-5/10 and had pain down R LE to calf (11/13/2021); 943 feet with SPC and SBA (two little standing breaks to stretch back due to tightness at low back, increased sharper pain in back and B legs, 12/06/2021);  948 feet with SPC (01/09/2022); 1045 feet with SPC no rests  but catches toes on floor at times (01/15/2022); 936 feet with SBA and mostly carrying cane for first 4 min of test. Scuffing feet on floor slightly. No stumbles. Sharp pain developed around 4 min in right glute/hip region worse on initial contact  that remained after seated rest from R glute to anterior thigh to knee and in toes (02/26/2022);  Goal status: IN PROGRESS  5.  Complete community, work and/or recreational activities without limitation due to current condition.  Baseline: difficulty with bed mobility, transfers, driving, household and community ambulation, preventing falls, floor transfers, playing with his daughter, stairs, social participation and socializing with friends (11/01/2021); walking is getting easier, transfers are easier, doesn't have to use his rollator as much, stairs are still troublesome, has not tried floor transfer, still restricted socially and continues to have significant limitations with functional activities and mobility (12/06/2021); walking and transfers are easier and is using [place] at home instead of rollator, continues to be restricted socially and have significant limitations with functional  activities, basic mobility, and balance (01/15/2022); driving is better and he is able to move his foot instead of his whole leg, still has not been able to get to the floor and back up, it is getting a little easier to do things around the house, still hard to lift heavy boxes and lift his daughter, he can stand a little longer to do the dishes, vacuuming the floor is less difficult than before, he continues to have limitations in socialization and community mobility, still is concerned about falling, continues to have urgency with bowel and bladder (02/26/2022);  Goal status: IN PROGRESS   PLAN: PT FREQUENCY: 2x/week  PT DURATION: 12 weeks  PLANNED INTERVENTIONS: Therapeutic exercises, Therapeutic activity, Neuromuscular re-education, Balance training, Gait training, Patient/Family education, Joint mobilization, Stair training, DME instructions, Dry Needling, and Electrical stimulation.  PLAN FOR NEXT SESSION: Update HEP as tolerated. Progressive LE, core, functional strengthening and balance exercises as tolerated and appropriate.    Everlean Alstrom. Graylon Good, PT, DPT 03/26/22, 8:15 PM  [facility] [address] [place], [place] [address] P: [phone] I F: [phone]

## 2022-03-28 ENCOUNTER — Ambulatory Visit: Payer: Medicare Other | Admitting: Physical Therapy

## 2022-03-28 ENCOUNTER — Encounter: Payer: Self-pay | Admitting: Physical Therapy

## 2022-03-28 DIAGNOSIS — M6281 Muscle weakness (generalized): Secondary | ICD-10-CM

## 2022-03-28 DIAGNOSIS — R296 Repeated falls: Secondary | ICD-10-CM

## 2022-03-28 DIAGNOSIS — R262 Difficulty in walking, not elsewhere classified: Secondary | ICD-10-CM

## 2022-03-28 DIAGNOSIS — R2689 Other abnormalities of gait and mobility: Secondary | ICD-10-CM

## 2022-03-28 DIAGNOSIS — M5459 Other low back pain: Secondary | ICD-10-CM | POA: Diagnosis not present

## 2022-03-28 DIAGNOSIS — M546 Pain in thoracic spine: Secondary | ICD-10-CM

## 2022-03-28 NOTE — Therapy (Signed)
OUTPATIENT PHYSICAL THERAPY TREATMENT NOTE   Patient Name: Travis Fitzpatrick MRN: [id number] DOB:Nov 06, 1980, 41 y.o., male Today's Date: 03/28/22     PT End of Session - 03/28/22 1822     Visit Number 39    Number of Visits 43    Date for PT Re-Evaluation 04/09/22    Authorization Type MEDICARE PART B reporting period from 01/15/2022    Progress Note Due on Visit 40    PT Start Time 1820    PT Stop Time 1900    PT Time Calculation (min) 40 min    Equipment Utilized During Treatment Gait belt    Activity Tolerance Patient tolerated treatment well    Behavior During Therapy WFL for tasks assessed/performed                Past Medical History:  Diagnosis Date   Bowel trouble    urgency   Medical history non-contributory    Urinary urgency    Past Surgical History:  Procedure Laterality Date   BACK SURGERY  2010   CIRCUMCISION     LUMBAR LAMINECTOMY/DECOMPRESSION MICRODISCECTOMY  07/20/2011   Procedure: LUMBAR LAMINECTOMY/DECOMPRESSION MICRODISCECTOMY;  Surgeon: Winfield Cunas;  Location: [facility] [facility];  Service: Neurosurgery;  Laterality: N/A;  right thoracotomy with thoracic eight-nine discectomy and fusion   LUMBAR LAMINECTOMY/DECOMPRESSION MICRODISCECTOMY Right 10/06/2021   Procedure: Right Lumbar Four-Five Microdiscectomy, Right Lumbar Five-Sacal One Foraminotomy;  Surgeon: Ashok Pall, MD;  Location: [facility];  Service: Neurosurgery;  Laterality: Right;  3C/RM 21   THORACIC DISCECTOMY  07/16/2012   Procedure: THORACIC DISCECTOMY;  Surgeon: Winfield Cunas, MD;  Location: [place];  Service: Neurosurgery;  Laterality: Right;  RIGHT Thoracic seven-eight  thoracic diskectomy via thoracotomy by dr Cyndia Bent   THORACIC DISCECTOMY N/A 12/15/2014   Procedure: THORACIC SEVEN TO THORACIC NINE Laminectomy ;  Surgeon: Ashok Pall, MD;  Location: [place];  Service: Neurosurgery;  Laterality: N/A;   THORACIC DISCECTOMY N/A 06/06/2017   Procedure: LAMINECTOMY THORACIC NINE-TEN;   Surgeon: Ashok Pall, MD;  Location: [employer];  Service: Neurosurgery;  Laterality: N/A;  LAMINECTOMY THORACIC NINE-TEN   THORACOTOMY  07/20/2011   Procedure: THORACOTOMY OPEN FOR SPINE SURGERY;  Surgeon: Pierre Bali, MD;  Location: [facility];  Service: Vascular;  Laterality: N/A;   THORACOTOMY  07/16/2012   Procedure: THORACOTOMY OPEN FOR SPINE SURGERY;  Surgeon: Gaye Pollack, MD;  Location: [facility];  Service: Thoracic;  Laterality: N/A;   Patient Active Problem List   Diagnosis Date Noted   HNP (herniated nucleus pulposus), lumbar 10/06/2021   Hand pain 06/28/2021   Lumbar facet arthropathy 01/11/2021   Thoracic spondylosis with myelopathy 01/11/2021   Thoracic spinal stenosis 06/06/2017   Stenosis, spinal, thoracic 12/15/2014   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 09/22/2014   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/2011    PCP: [place] [facility]  REFERRING PROVIDER: Ashok Pall, MD  REFERRING DIAG: 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  PERTINENT HISTORY: Patient is a 41 y.o. male who presents to outpatient physical therapy  with a referral for medical diagnosis  intervertebral disc disorder of thoracic region with myelopathy. This patient's chief complaints consist of low back pain, R LE pain/paresthesia/weakness, L LE weakness s/p right L4-5 laminotomy, microdiscectomy on 10/06/2021 leading to the following functional deficits: difficulty with bed mobility, transfers,  driving, household and community ambulation, preventing falls, floor transfers, playing with his daughter, stairs, social participation and socializing with friends. 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    Patient denies hx of cancer, stroke, seizures, lung problems, heart problems, diabetes, unexplained weight loss, and osteoporosis. Patient reports he has difficulty with making it to the bathroom on time when he gets bowel or bladder urges and has stumbling. He has history of thoracic and lumbar surgeries.  PRECAUTIONS: falls. No post-op restrictions after 4 months from surgery.   PATIENT GOALS: get better and get off the walker to walk with [facility], play with his daughter (DOB 07/04/2020), get his strength back, be able to get up and down from floor.  SUBJECTIVE: Patient reports he was sore in his back and right ankle after his fall during last PT session. He reports he is back to his usual baseline pain of 2/10 in the low back, right posterior thigh, and right groin. He states he is feeling a bit better than last visit as far as his stability.    PAIN:  Are you having pain? 2/10 Location: surgical site at low back to right groin and R posterior thigh.   OBJECTIVE    TODAY'S TREATMENT  Therapeutic exercise: to centralize symptoms and improve ROM, strength, muscular endurance, and activity tolerance required for successful completion of functional activities.  - NuStep level 6 using bilateral upper and lower extremities. Seat setting 11. For improved extremity mobility, muscular endurance, and activity tolerance; and to induce the analgesic  effect of aerobic exercise, stimulate improved joint nutrition, and prepare body structures and systems for following interventions. 6 min. Average SPM = 81. - standing R QL and back stretch with left lumbar rotation and let sidebending in door frame, 3x30 seconds.  - hooklying left lumbar rotation and left sidebending by letting knees fall towards right while bending laterally left with arms overhead, 3x30 seconds to open up right sided foramen.   Neuromuscular Re-education: to improve, balance, postural strength, muscle activation patterns, and stabilization strength required for functional activities: - lateral step over 6 inch hurdle with touchdown UE support on TM bar with LOB, 1x10 each direction. CGA.  - forwards/backwards step over 6 inch hurdle with U UE support on TM bar attempting to let go as much as possible, 1x10 leading with each foot each direction, CGA.   Limits of stability exercise, feet set with heels at C5 and C15 Trial 1: medium difficulty, 39% in 1:08 min.  Trial 2: medium difficulty, 64% in 48 seconds Trial 3: medium difficulty, 67% in 43 seconds  Manual therapy: to reduce pain and tissue tension, improve range of motion, neuromodulation, in order to promote improved ability to complete functional activities. PRONE with 2 pillows under abdomen and one under chest.  - CPA and B UPA grade II-III from mid thoracic to L5 to decrease tension. Reproduction of right groin, R hip/leg  pain and R anterior leg paresthesia with CPA and R UPA at upper lumbar spine, no better with repetition.  LEFT SIDELYING - R QL and lateral lumbar gapping in neutral rotation (decreased R hip/groin pain) 1x10 with 10 second hold. R lumbar rotation increased right hip/groin pain).   Pt required multimodal cuing for proper technique and to facilitate improved neuromuscular control, strength, range of motion, and functional ability resulting in improved performance and form.  HOME EXERCISE PROGRAM:   Access Code: [email or web address] URL: [email or web address] Date: 11/13/2021 Prepared by: Rosita Kea  Exercises - Sit to Stand  - 1 x weekly - 2-3 sets - 5-10 reps - Seated Isometric Hip Adduction with Ball  - 1 x daily - 2-3 sets - 5-10 reps - 5 seconds hold - Seated Toe Raise  - 1 x daily - 2-3 sets - 10 reps - Seated Single Arm Shoulder Row with Anchored Resistance  - 1 x daily - 3 sets - 10 reps  PATIENT EDUCATION:  Education details: Form/technique with exercise. Rational for interventions.  Person educated: Patient Education method: Explanation, Demonstration, Tactile cues, and Verbal cues Education comprehension: verbalized understanding, returned demonstration, verbal cues required, tactile cues required, and needs further education   ASSESSMENT:  CLINICAL IMPRESSION: Patient arrives at recent baseline but continues to have difficulty with weight bearing on R > L single leg stance while completing hurdle exercise with report of feeling like his R LE will give way. Continues to have radicular symptoms on that side including weakness and groin pain. Attempted stretching in the lumbar region and gentle manual interventions to further assess and attempt to relieve pain in the groin and R LE region. Patient has reproduced R groin, hip and leg pain to calf and anterior thigh paresthesia with CPA and R UPA at upper lumbar spine. He had temporarily decreased pain with left lateral bending and left lumbar rotation. However, he had increased pulling in the low back and right hip and pain below his right knee following manual therapy and stretches suggesting worsening of radicular symptoms with at least some of those intervention. Plan to consider doing relieving stretches without provocative joint mobilizations next session as appropriate. Patient is also due for a progress note next session. Patient would benefit from continued management of limiting condition by skilled physical therapist to  address remaining impairments and functional limitations to work towards stated goals and return to PLOF or maximal functional independence.    OBJECTIVE IMPAIRMENTS Abnormal gait, decreased activity tolerance, decreased balance, decreased coordination, decreased endurance, decreased knowledge of use of DME, decreased mobility, difficulty walking, decreased ROM, decreased strength, impaired perceived functional ability, impaired flexibility, impaired sensation, impaired tone, impaired UE functional use, postural dysfunction, obesity, and pain.   ACTIVITY LIMITATIONS cleaning, community activity, driving, occupation, laundry, shopping, and basic mobility such as bed mobility, transfers, driving, household and community ambulation, preventing falls, floor transfers, playing with his daughter, stairs, social participation and socializing with friends .   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    REHAB POTENTIAL: Good  CLINICAL DECISION MAKING: Evolving/moderate complexity  EVALUATION COMPLEXITY: Moderate  GOALS: Goals reviewed with patient? No  SHORT TERM GOALS: Target date: 11/15/2021  Be independent with initial home exercise program for self-management of symptoms. Baseline: initial HEP to be provided at visit 2 as appropriate (11/01/2021);  Goal status: MET  LONG TERM GOALS: Target date: 01/24/2022; Updated to 04/09/2022 on 01/15/2022.  Be independent with a long-term home exercise program  for self-management of symptoms.  Baseline: Initial HEP to be provided at visit 2 as appropriate (11/01/2021); patient currently participating well (12/06/2021; 01/15/2022; 02/26/2022);  Goal status: IN PROGRESS  2.  Demonstrate improved FOTO to equal or greater than 42 by visit #14 to demonstrate improvement in overall condition and self-reported functional ability.  Baseline: 26 (11/01/2021); 45 at visit #10 (12/06/2021); 53 at visit# 20 (01/15/2022); 50 at visit #30 (02/26/2022);  Goal status: ACHIEVED 12/06/2021  3.  Patient will complete 5 Time Sit To Stand Test in equal or less than 15 seconds for 18.5 inch surface with no UE support to demonstrate improved ability to complete transfers and decrease fall risk.  Baseline: 45 second from 18.5 inch plinth with R UE support on plinth and L UE support on locked rollator and B UE support on rollator in standing. Multiple attempts needed on some reps (11/01/2021); 21 second from 18.5 inch plinth with B UE support on plinth. Walker positioned in front of him for safety but did not need it. (12/06/2021); 21 second from 18.5 inch plinth with no UE support. Walker positioned in front of him for safety but did not need it (01/15/2022); 15 seconds from 18.5 inch plinth with no UE support. Walker positioned in front of him for safety but did not need it (02/26/2022); Goal status: IN PROGRESS  4.  Patient will ambulate equal or greater than 1200 feet on 6 Minute Walk Test with [facility]  to demonstrate improved household and community mobility as well as community and social participation.  Baseline: to be tested visit 2 as appropriate (11/01/2021); 800 feet with rollator. Low back pain increased to 4-5/10 and had pain down R LE to calf (11/13/2021); 943 feet with SPC and SBA (two little standing breaks to stretch back due to tightness at low back, increased sharper pain in back and B legs, 12/06/2021);  948 feet with SPC (01/09/2022); 1045 feet with SPC no rests  but catches toes on  floor at times (01/15/2022); 936 feet with SBA and mostly carrying cane for first 4 min of test. Scuffing feet on floor slightly. No stumbles. Sharp pain developed around 4 min in right glute/hip region worse on initial contact that remained after seated rest from R glute to anterior thigh to knee and in toes (02/26/2022);  Goal status: IN PROGRESS  5.  Complete community, work and/or recreational activities without limitation due to current condition.  Baseline: difficulty with bed mobility, transfers, driving, household and community ambulation, preventing falls, floor transfers, playing with his daughter, stairs, social participation and socializing with friends (11/01/2021); walking is getting easier, transfers are easier, doesn't have to use his rollator as much, stairs are still troublesome, has not tried floor transfer, still restricted socially and continues to have significant limitations with functional activities and mobility (12/06/2021); walking and transfers are easier and is using [place] at home instead of rollator, continues to be restricted socially and have significant limitations with functional activities, basic mobility, and balance (01/15/2022); driving is better and he is able to move his foot instead of his whole leg, still has not been able to get to  the floor and back up, it is getting a little easier to do things around the house, still hard to lift heavy boxes and lift his daughter, he can stand a little longer to do the dishes, vacuuming the floor is less difficult than before, he continues to have limitations in socialization and community mobility, still is concerned about falling, continues to have urgency with bowel and bladder (02/26/2022);  Goal status: IN PROGRESS   PLAN: PT FREQUENCY: 2x/week  PT DURATION: 12 weeks  PLANNED INTERVENTIONS: Therapeutic exercises, Therapeutic activity, Neuromuscular re-education, Balance training, Gait training, Patient/Family education, Joint  mobilization, Stair training, DME instructions, Dry Needling, and Electrical stimulation.  PLAN FOR NEXT SESSION: Update HEP as tolerated. Progressive LE, core, functional strengthening and balance exercises as tolerated and appropriate.    Everlean Alstrom. Graylon Good, PT, DPT 03/28/22, 8:38 PM  [facility] [address] [place], [place] [address] P: [phone] I F: [phone]

## 2022-04-02 ENCOUNTER — Encounter: Payer: Self-pay | Admitting: Physical Therapy

## 2022-04-02 ENCOUNTER — Ambulatory Visit: Payer: Medicare Other | Admitting: Physical Therapy

## 2022-04-02 DIAGNOSIS — M5459 Other low back pain: Secondary | ICD-10-CM | POA: Diagnosis not present

## 2022-04-02 DIAGNOSIS — M6281 Muscle weakness (generalized): Secondary | ICD-10-CM

## 2022-04-02 DIAGNOSIS — R2689 Other abnormalities of gait and mobility: Secondary | ICD-10-CM

## 2022-04-02 DIAGNOSIS — M546 Pain in thoracic spine: Secondary | ICD-10-CM

## 2022-04-02 NOTE — Therapy (Signed)
OUTPATIENT PHYSICAL THERAPY TREATMENT NOTE / PROGRESS NOTE / RE-CERTIFICATION Dates of reporting from 02/26/2022 to 04/02/2022   Patient Name: Travis Fitzpatrick MRN: [id number] DOB:[birth year], 41 y.o., male Today's Date: 04/02/22     PT End of Session - 04/02/22 1814     Visit Number 40    Number of Visits 18    Date for PT Re-Evaluation 06/25/22    Authorization Type MEDICARE PART B reporting period from 01/15/2022    Progress Note Due on Visit 12    PT Start Time 1815    PT Stop Time 1855    PT Time Calculation (min) 40 min    Equipment Utilized During Treatment Gait belt    Activity Tolerance Patient tolerated treatment well    Behavior During Therapy WFL for tasks assessed/performed                 Past Medical History:  Diagnosis Date   Bowel trouble    urgency   Medical history non-contributory    Urinary urgency    Past Surgical History:  Procedure Laterality Date   BACK SURGERY  2010   CIRCUMCISION     LUMBAR LAMINECTOMY/DECOMPRESSION MICRODISCECTOMY  07/20/2011   Procedure: LUMBAR LAMINECTOMY/DECOMPRESSION MICRODISCECTOMY;  Surgeon: Winfield Cunas;  Location: [place] [facility];  Service: Neurosurgery;  Laterality: N/A;  right thoracotomy with thoracic eight-nine discectomy and fusion   LUMBAR LAMINECTOMY/DECOMPRESSION MICRODISCECTOMY Right 10/06/2021   Procedure: Right Lumbar Four-Five Microdiscectomy, Right Lumbar Five-Sacal One Foraminotomy;  Surgeon: Ashok Pall, MD;  Location: [place];  Service: Neurosurgery;  Laterality: Right;  3C/RM 21   THORACIC DISCECTOMY  07/16/2012   Procedure: THORACIC DISCECTOMY;  Surgeon: Winfield Cunas, MD;  Location: [place] [facility];  Service: Neurosurgery;  Laterality: Right;  RIGHT Thoracic seven-eight  thoracic diskectomy via thoracotomy by dr Cyndia Bent   THORACIC DISCECTOMY N/A 12/15/2014   Procedure: THORACIC SEVEN TO THORACIC NINE Laminectomy ;  Surgeon: Ashok Pall, MD;  Location: [place] [facility];  Service: Neurosurgery;  Laterality: N/A;    THORACIC DISCECTOMY N/A 06/06/2017   Procedure: LAMINECTOMY THORACIC NINE-TEN;  Surgeon: Ashok Pall, MD;  Location: [place];  Service: Neurosurgery;  Laterality: N/A;  LAMINECTOMY THORACIC NINE-TEN   THORACOTOMY  07/20/2011   Procedure: THORACOTOMY OPEN FOR SPINE SURGERY;  Surgeon: Pierre Bali, MD;  Location: [facility];  Service: Vascular;  Laterality: N/A;   THORACOTOMY  07/16/2012   Procedure: THORACOTOMY OPEN FOR SPINE SURGERY;  Surgeon: Gaye Pollack, MD;  Location: [place] [facility];  Service: Thoracic;  Laterality: N/A;   Patient Active Problem List   Diagnosis Date Noted   HNP (herniated nucleus pulposus), lumbar 10/06/2021   Hand pain 06/28/2021   Lumbar facet arthropathy 01/11/2021   Thoracic spondylosis with myelopathy 01/11/2021   Thoracic spinal stenosis 06/06/2017   Stenosis, spinal, thoracic 12/15/2014   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  PERTINENT HISTORY: Patient is a 41 y.o. male who presents to  outpatient physical therapy with a referral for medical diagnosis  intervertebral disc disorder of thoracic region with myelopathy. This patient's chief complaints consist of low back pain, R LE pain/paresthesia/weakness, L LE weakness s/p right L4-5 laminotomy, microdiscectomy on 10/06/2021 leading to the following functional deficits: difficulty with bed  mobility, transfers, driving, household and community ambulation, preventing falls, floor transfers, playing with his daughter, stairs, social participation and socializing with friends. 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 Patient denies hx of cancer, stroke, seizures, lung problems, heart problems, diabetes, unexplained weight loss, and osteoporosis. Patient reports he has difficulty with making it to the bathroom on time when he gets bowel or bladder urges and has stumbling. He has history of thoracic and lumbar surgeries.  PRECAUTIONS: falls. No post-op restrictions after 4 months from surgery.   PATIENT GOALS: get better and get off the walker to walk with [facility], play with his daughter (DOB 07/04/2020), get his strength back, be able to get up and down from floor.  SUBJECTIVE: Patient arrives on his rollator. He states he is having more pain since last PT session. He felt his pain was worse after last session and he has had trouble with weight bearing on R LE since then. He has continued to do his HEP as able and move around to try to keep things loose at home. He states his pain is in his right glute, groin, anterior thigh, and posterior calf. It is worst in his right groin with weight bearing and his R LE tends to suddenly "give out" on him. He did notice he could feel a misquito bite on his right lateral lower leg and has not been able to feel that before. He feels he has been worsening over the last two weeks, bu the wonders if it is because his feeling is coming back in his R leg and it is pain. He continues to have sudden urinary and bowel  urgency with incontinence if there is a delay. These bowel and bladder symptoms are correlated with him feeling weaker and less coordinated in R LE.    PAIN:  Are you having pain? 4/10 Location: surgical site at low back (2/10), right glute, to right groin and R anterior thigh, R posterior calf.  OBJECTIVE  SELF-REPORTED FUNCTION FOTO score: 53/100 (lumbar spine questionnaire)  FUNCTIONAL TESTS:  5 times sit to stand: 23 second from 18.5 inch plinth with R UE support on plinth. Walker positioned in front of him for safety. Unable to perform without UE support. Reports his R LE feels weak when he puts weight/pressure on it and it feels like a pinched nerve in his groin and hip.    6 Minute Walk Test: 773 feet with SBA with rollator. R LE gave way once during test and patient felt he could barely make it to the end. He recovered balance with use of B UE support on rollator. Reports sudden feeling that right LE will give way.   SPECIAL TESTS LOWER LIMB NEURODYNAMIC TESTS Straight Leg Raise (Sciatic nerve)  R  = positive for concordant calf pain.       TODAY'S TREATMENT  Therapeutic exercise: to centralize symptoms and improve ROM, strength, muscular endurance,  and activity tolerance required for successful completion of functional activities.  - functional testing to assess progress (see above).  - supine B double legs to chest with feet on green theraball, AAROM, 1x10 - supine LTR with B LE on green theraball, AAROM, 1x10 each direction.  - supine <> sit with min A - NuStep level 5 using bilateral upper and lower extremities. Seat setting 11. For improved extremity mobility, muscular endurance, and activity tolerance; and to induce the analgesic effect of aerobic exercise, stimulate improved joint nutrition, and prepare body structures and systems for following interventions. 9:30 min. Average SPM = 76.  Manual therapy: to reduce pain and tissue tension, improve range of motion,  neuromodulation, in order to promote improved ability to complete functional activities. SUPINE/HOOKLYING - LAD through right hip, 1x20 seconds (discontinued due to increased symptoms in R groin and LE.  - STM to right iliacus (painful).  - R PROM sciatic nerve glide (slider), 1x10  Pt required multimodal cuing for proper technique and to facilitate improved neuromuscular control, strength, range of motion, and functional ability resulting in improved performance and form.  HOME EXERCISE PROGRAM:  Access Code: [email or web address] URL: [email or web address] Date: 11/13/2021 Prepared by: Rosita Kea  Exercises - Sit to Stand  - 1 x weekly - 2-3 sets - 5-10 reps - Seated Isometric Hip Adduction with Ball  - 1 x daily - 2-3 sets - 5-10 reps - 5 seconds hold - Seated Toe Raise  - 1 x daily - 2-3 sets - 10 reps - Seated Single Arm Shoulder Row with Anchored Resistance  - 1 x daily - 3 sets - 10 reps  PATIENT EDUCATION:  Education details: Form/technique with exercise. Rational for interventions. POC. Progress  Person educated: Patient Education method: Explanation, Demonstration, Tactile cues, and Verbal cues Education comprehension: verbalized understanding, returned demonstration, verbal cues required, tactile cues required, and needs further education   ASSESSMENT:  CLINICAL IMPRESSION: Patient has attended 40 physical therapy sessions since starting current episode of care on 11/01/2021. Patient's attendance, effort, and participation have been excellent. Patient has been progressing steadily towards goals until about 2 weeks ago he started having increased R LE symptoms that seem to be worsening and have negatively effected his 6 Minute Walk Test and 5 Time Sit To Stand Test. He also fell in the clinic last week when his R LE suddenly gave way which had previously been rarely happening and usually had more warning. Patient has also reported an increase in bowel and bladder symptoms  including sudden urgency and incontinence that correlates with LE motor control, strength, and activity tolerance. PT recommends consulting with referring physician about worsening symptoms while continuing physical therapy as tolerated. Patient would also benefit from pelvic floor PT evaluation and consideration for treatment related to his difficulties with pain in the groin and bowel and bladder difficulties. Plan to continue working on functional mobility, balance, and strength as tolerated and within any guidelines physician may provide. Patient would benefit from continued management of limiting condition by skilled physical therapist to address remaining impairments and functional limitations to work towards stated goals and return to PLOF or maximal functional independence.   OBJECTIVE IMPAIRMENTS Abnormal gait, decreased activity tolerance, decreased balance, decreased coordination, decreased endurance, decreased knowledge of use of DME, decreased mobility, difficulty walking, decreased ROM, decreased strength, impaired perceived functional ability, impaired flexibility, impaired sensation, impaired tone, impaired UE functional use, postural dysfunction, obesity, and pain.   ACTIVITY LIMITATIONS cleaning, community activity, driving, occupation, laundry, shopping, and  basic mobility such as bed mobility, transfers, driving, household and community ambulation, preventing falls, floor transfers, playing with his daughter, stairs, social participation and socializing with friends .   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    REHAB POTENTIAL: Good  CLINICAL DECISION MAKING: Evolving/moderate complexity  EVALUATION COMPLEXITY: Moderate   GOALS: Goals reviewed with patient? No  SHORT TERM GOALS: Target date: 11/15/2021  Be independent with initial home exercise program for self-management of symptoms. Baseline: initial HEP to be provided at visit 2 as appropriate (11/01/2021);  Goal status: MET  LONG TERM GOALS: Target date: 01/24/2022; Updated to 04/09/2022 on 01/15/2022. UPDATED to 06/25/2022 for all unmet goals on 04/02/2022  Be independent with a long-term home exercise program for self-management of symptoms.  Baseline: Initial HEP to be provided at visit 2 as appropriate (11/01/2021); patient currently participating well (12/06/2021; 01/15/2022; 02/26/2022); participating as able (04/02/2022);  Goal status: IN PROGRESS  2.  Demonstrate improved FOTO to equal or greater than 42 by visit #14 to demonstrate improvement in overall condition and self-reported functional ability.  Baseline: 26 (11/01/2021); 45 at visit #10 (12/06/2021); 53 at visit# 20 (01/15/2022); 50 at visit #30 (02/26/2022); 53 at visit #40 (04/02/2022);  Goal status: ACHIEVED 12/06/2021  3.  Patient will complete 5 Time Sit To Stand Test in equal or less than 15 seconds for 18.5 inch surface with no UE support to demonstrate improved ability to complete transfers and decrease fall risk.  Baseline: 45 second from 18.5 inch plinth with R UE support on plinth and L UE support on locked rollator and B UE support on rollator in standing. Multiple attempts needed on some reps (11/01/2021); 21 second from 18.5 inch plinth with B UE support on plinth. Walker positioned in front of him for safety but did not need it. (12/06/2021); 21 second from 18.5 inch plinth with no UE support. Walker positioned in front of him for safety but did not need  it (01/15/2022); 15 seconds from 18.5 inch plinth with no UE support. Walker positioned in front of him for safety but did not need it (02/26/2022); 23 second from 18.5 inch plinth with R UE support on plinth. Walker positioned in front of him for safety. Unable to perform without UE support. Reports his R LE feels weak when he puts weight/pressure on it and it feels like a pinched nerve in his groin and hip (04/02/2022);  Goal status: IN PROGRESS  4.  Patient will ambulate equal or greater than 1200 feet on 6 Minute Walk Test with [facility]  to demonstrate improved household and community mobility as well as community and social participation.  Baseline: to be tested visit 2 as appropriate (11/01/2021); 800 feet with rollator. Low back pain increased to 4-5/10 and had pain down R LE to calf (11/13/2021); 943 feet with SPC and SBA (two little standing breaks to stretch back due to tightness at low back, increased sharper pain in back and B legs, 12/06/2021);  948 feet with SPC (01/09/2022); 1045 feet with SPC no rests  but catches toes on floor at times (01/15/2022); 936 feet  with SBA and mostly carrying cane for first 4 min of test. Scuffing feet on floor slightly. No stumbles. Sharp pain developed around 4 min in right glute/hip region worse on initial contact that remained after seated rest from R glute to anterior thigh to knee and in toes (02/26/2022); 773 feet with SBA with rollator. R LE gave way once during test and patient felt he could barely make it to the end. He recovered balance with use of B UE support on rollator. Reports sudden feeling that right LE will give way (04/02/2022);  Goal status: IN PROGRESS  5.  Complete community, work and/or recreational activities without limitation due to current condition.  Baseline: difficulty with bed mobility, transfers, driving, household and community ambulation, preventing falls, floor transfers, playing with his daughter, stairs, social participation and socializing  with friends (11/01/2021); walking is getting easier, transfers are easier, doesn't have to use his rollator as much, stairs are still troublesome, has not tried floor transfer, still restricted socially and continues to have significant limitations with functional activities and mobility (12/06/2021); walking and transfers are easier and is using [place] at home instead of rollator, continues to be restricted socially and have significant limitations with functional activities, basic mobility, and balance (01/15/2022); driving is better and he is able to move his foot instead of his whole leg, still has not been able to get to the floor and back up, it is getting a little easier to do things around the house, still hard to lift heavy boxes and lift his daughter, he can stand a little longer to do the dishes, vacuuming the floor is less difficult than before, he continues to have limitations in socialization and community mobility, still is concerned about falling, continues to have urgency with bowel and bladder (02/26/2022); everything has gotten a little harder around the house over the last couple of weeks due to right LE weakness, and groin, hip, low back pain (04/02/2022);  Goal status: IN PROGRESS   PLAN: PT FREQUENCY: 2x/week  PT DURATION: 12 weeks  PLANNED INTERVENTIONS: Therapeutic exercises, Therapeutic activity, Neuromuscular re-education, Balance training, Gait training, Patient/Family education, Joint mobilization, Stair training, DME instructions, Dry Needling, and Electrical stimulation.  PLAN FOR NEXT SESSION: Update HEP as tolerated. Progressive LE, core, functional strengthening and balance exercises as tolerated and appropriate.    Everlean Alstrom. Graylon Good, PT, DPT 04/02/22, 8:36 PM  [facility] [address] [place], [place] [address] P: [phone] I F: [phone]

## 2022-04-03 ENCOUNTER — Telehealth: Payer: Self-pay | Admitting: Physical Therapy

## 2022-04-03 NOTE — Telephone Encounter (Signed)
Called patient's back surgeon Coletta Memos at [facility] [phone]. Spoke with Aram Beecham to let her know patient's right leg weakness and B/B symptoms have gotten worse and I am concerned about it. She said she had recent PT note and would get him the message so he can schedule patient.   Luretha Murphy. Ilsa Iha, PT, DPT 04/03/22, 11:06 AM  [facility] [address] [place], [place] [address] P: [phone] I F: [phone]

## 2022-04-04 ENCOUNTER — Ambulatory Visit: Payer: Medicare Other | Admitting: Physical Therapy

## 2022-04-04 ENCOUNTER — Encounter: Payer: Self-pay | Admitting: Physical Therapy

## 2022-04-04 DIAGNOSIS — M5459 Other low back pain: Secondary | ICD-10-CM | POA: Diagnosis not present

## 2022-04-04 DIAGNOSIS — M6281 Muscle weakness (generalized): Secondary | ICD-10-CM

## 2022-04-04 DIAGNOSIS — R2689 Other abnormalities of gait and mobility: Secondary | ICD-10-CM

## 2022-04-04 DIAGNOSIS — M546 Pain in thoracic spine: Secondary | ICD-10-CM

## 2022-04-04 DIAGNOSIS — R296 Repeated falls: Secondary | ICD-10-CM

## 2022-04-04 DIAGNOSIS — R262 Difficulty in walking, not elsewhere classified: Secondary | ICD-10-CM

## 2022-04-04 NOTE — Therapy (Signed)
OUTPATIENT PHYSICAL THERAPY TREATMENT NOTE   Patient Name: Travis Fitzpatrick MRN: [id number] DOB:06/24/1981, 41 y.o., male Today's Date: 04/04/22     PT End of Session - 04/04/22 1934     Visit Number 14    Number of Visits 76    Date for PT Re-Evaluation 06/25/22    Authorization Type MEDICARE PART B reporting period from 04/02/2022    Progress Note Due on Visit 28    PT Start Time 1817    PT Stop Time 1900    PT Time Calculation (min) 43 min    Activity Tolerance Patient tolerated treatment well    Behavior During Therapy [facility] for tasks assessed/performed              Past Medical History:  Diagnosis Date   Bowel trouble    urgency   Medical history non-contributory    Urinary urgency    Past Surgical History:  Procedure Laterality Date   BACK SURGERY  2010   CIRCUMCISION     LUMBAR LAMINECTOMY/DECOMPRESSION MICRODISCECTOMY  07/20/2011   Procedure: LUMBAR LAMINECTOMY/DECOMPRESSION MICRODISCECTOMY;  Surgeon: Winfield Cunas;  Location: [place] [facility];  Service: Neurosurgery;  Laterality: N/A;  right thoracotomy with thoracic eight-nine discectomy and fusion   LUMBAR LAMINECTOMY/DECOMPRESSION MICRODISCECTOMY Right 10/06/2021   Procedure: Right Lumbar Four-Five Microdiscectomy, Right Lumbar Five-Sacal One Foraminotomy;  Surgeon: Ashok Pall, MD;  Location: [place];  Service: Neurosurgery;  Laterality: Right;  3C/RM 21   THORACIC DISCECTOMY  07/16/2012   Procedure: THORACIC DISCECTOMY;  Surgeon: Winfield Cunas, MD;  Location: [place] [facility];  Service: Neurosurgery;  Laterality: Right;  RIGHT Thoracic seven-eight  thoracic diskectomy via thoracotomy by dr Cyndia Bent   THORACIC DISCECTOMY N/A 12/15/2014   Procedure: THORACIC SEVEN TO THORACIC NINE Laminectomy ;  Surgeon: Ashok Pall, MD;  Location: [place] [facility];  Service: Neurosurgery;  Laterality: N/A;   THORACIC DISCECTOMY N/A 06/06/2017   Procedure: LAMINECTOMY THORACIC NINE-TEN;  Surgeon: Ashok Pall, MD;  Location: [place];   Service: Neurosurgery;  Laterality: N/A;  LAMINECTOMY THORACIC NINE-TEN   THORACOTOMY  07/20/2011   Procedure: THORACOTOMY OPEN FOR SPINE SURGERY;  Surgeon: Pierre Bali, MD;  Location: [facility];  Service: Vascular;  Laterality: N/A;   THORACOTOMY  07/16/2012   Procedure: THORACOTOMY OPEN FOR SPINE SURGERY;  Surgeon: Gaye Pollack, MD;  Location: [place] [facility];  Service: Thoracic;  Laterality: N/A;   Patient Active Problem List   Diagnosis Date Noted   HNP (herniated nucleus pulposus), lumbar 10/06/2021   Hand pain 06/28/2021   Lumbar facet arthropathy 01/11/2021   Thoracic spondylosis with myelopathy 01/11/2021   Thoracic spinal stenosis 06/06/2017   Stenosis, spinal, thoracic 12/15/2014   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 09/22/2014   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/2011    PCP: [place] @ [facility]  REFERRING PROVIDER: Ashok Pall, MD  REFERRING DIAG: 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  PERTINENT HISTORY: Patient is a 41 y.o. male who presents to outpatient physical therapy with a referral for medical diagnosis  intervertebral disc disorder of  thoracic region with myelopathy. This patient's chief complaints consist of low back pain, R LE pain/paresthesia/weakness, L LE weakness s/p right L4-5 laminotomy, microdiscectomy on 10/06/2021 leading to the following functional deficits: difficulty with bed mobility, transfers, driving, household and community ambulation,  preventing falls, floor transfers, playing with his daughter, stairs, social participation and socializing with friends. 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 Patient denies hx of cancer, stroke, seizures, lung problems, heart problems, diabetes, unexplained weight loss, and osteoporosis. Patient reports he has difficulty with making it to the bathroom on time when he gets bowel or bladder urges and has stumbling. He has history of thoracic and lumbar surgeries.  PRECAUTIONS: falls. No post-op restrictions after 4 months from surgery.   PATIENT GOALS: get better and get off the walker to walk with [facility], play with his daughter (DOB 07/04/2020), get his strength back, be able to get up and down from floor.  SUBJECTIVE: Patient arrives on his rollator. Patient reports he is feeling a bit better today. He feels a bit ginger still when he put weight on his right LE, and he feels it all the way down to the right calf. After leaving last PT session his symptoms got worse and he spent half the next day laying in bed which helped improve it to 2/10 pain upon arrival today.     PAIN:  Are you having pain? 2/10 Location: low back to right glute, groin, back of thigh and posterior and medial calf.   OBJECTIVE     TODAY'S TREATMENT  Therapeutic exercise: to centralize symptoms and improve ROM, strength, muscular endurance, and activity tolerance required for successful completion of functional activities.  - NuStep level 6 using bilateral upper and lower extremities. Seat/handle setting 11/11. For improved extremity mobility, muscular endurance, and  activity tolerance; and to induce the analgesic effect of aerobic exercise, stimulate improved joint nutrition, and prepare body structures and systems for following interventions. 6:28 min. Average SPM = 81. - quadruped bird dog with LE only, discontinued after 1-2 reps each side due to R LE pain worsening with R sided weight bearing.  - prone multifidus kick with two pillows under abdomen, 1x10 each side. Further sets not completed due to increasing pain in lower back and right leg especially with pressure on mat to R leg when lifting left leg.  - prone abdominal brace with alternating scaption lifts holding 2#DB in each hand. 1x10, 1x5 (discontinued due to increasing R groin pain).  - hooklying LTR, 2 min self selected pace and tolerated ROM. (No worse).  - seated AAROM lumbar flexion with theraball, 5 second holds, 2 min forwards, 2 min alternating diagonals. Cuing to allow neck to flex. Discontinued diagonals due to painful popping pain in right groin when moving towards the right.  - seated pallof press, 1x10, 1x20 with anchor on right, 1x20 with anchor on left. (More painful and hard with anchor on right).  - education about imaging findings, cauda equina syndrome, neurogenic bladder, need to follow up with physician.   Pt required multimodal cuing for proper technique and to facilitate improved neuromuscular control, strength, range of motion, and functional ability resulting in improved performance and form.  HOME EXERCISE  PROGRAM:  Access Code: [email or web address] URL: [email or web address] Date: 11/13/2021 Prepared by: Rosita Kea  Exercises - Sit to Stand  - 1 x weekly - 2-3 sets - 5-10 reps - Seated Isometric Hip Adduction with Ball  - 1 x daily - 2-3 sets - 5-10 reps - 5 seconds hold - Seated Toe Raise  - 1 x daily - 2-3 sets - 10 reps - Seated Single Arm Shoulder Row with Anchored Resistance  - 1 x daily - 3 sets - 10 reps  PATIENT EDUCATION:  Education details:  Form/technique with exercise. Rational for interventions. POC. Progress  Person educated: Patient Education method: Explanation, Demonstration, Tactile cues, and Verbal cues Education comprehension: verbalized understanding, returned demonstration, verbal cues required, tactile cues required, and needs further education   ASSESSMENT:  CLINICAL IMPRESSION: Patient feeling a little better upon arrival compared to last session but continues to have poor tolerance for activities that require weight bearing through right LE. Modified exercises to accommodate pain and attempt to avoid exacerbation of symptoms as after last PT session. Patient continued to have pain that was most bothersome at the right groin. Plan to continue with exercises as tolerated but recommend patient follow up with referring physician about worsening of symptoms. Patient also noted that his worsening in symptoms reminds him of when he had a buildup of calcium in his spine after his earlier thoracic surgery which resulted in surgeon going back in to clean it out. Patient would benefit from continued management of limiting condition by skilled physical therapist to address remaining impairments and functional limitations to work towards stated goals and return to PLOF or maximal functional independence.    OBJECTIVE IMPAIRMENTS Abnormal gait, decreased activity tolerance, decreased balance, decreased coordination, decreased endurance, decreased knowledge of use of DME, decreased mobility, difficulty walking, decreased ROM, decreased strength, impaired perceived functional ability, impaired flexibility, impaired sensation, impaired tone, impaired UE functional use, postural dysfunction, obesity, and pain.   ACTIVITY LIMITATIONS cleaning, community activity, driving, occupation, laundry, shopping, and basic mobility such as bed mobility, transfers, driving, household and community ambulation, preventing falls, floor transfers, playing  with his daughter, stairs, social participation and socializing with friends .   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    REHAB POTENTIAL: Good  CLINICAL DECISION MAKING: Evolving/moderate complexity  EVALUATION COMPLEXITY: Moderate   GOALS: Goals reviewed with patient? No  SHORT TERM GOALS: Target date: 11/15/2021  Be independent with initial home exercise program for self-management of symptoms. Baseline: initial HEP to be provided at visit 2 as appropriate (11/01/2021);  Goal status: MET  LONG TERM GOALS: Target date: 01/24/2022; Updated to 04/09/2022 on 01/15/2022. UPDATED to 06/25/2022 for all unmet goals on 04/02/2022  Be independent with a long-term home exercise program for self-management of symptoms.  Baseline: Initial HEP to be provided at visit 2 as appropriate (11/01/2021); patient currently participating well (12/06/2021; 01/15/2022; 02/26/2022); participating as able (04/02/2022);  Goal status: IN PROGRESS  2.  Demonstrate improved FOTO to equal or greater than 42 by visit #14 to demonstrate improvement in overall condition and self-reported functional ability.  Baseline: 26 (11/01/2021); 45  at visit #10 (12/06/2021); 53 at visit# 20 (01/15/2022); 50 at visit #30 (02/26/2022); 53 at visit #40 (04/02/2022);  Goal status: ACHIEVED 12/06/2021  3.  Patient will complete 5 Time Sit To Stand Test in equal or less than 15 seconds for 18.5 inch surface with no UE support to demonstrate improved  ability to complete transfers and decrease fall risk.  Baseline: 45 second from 18.5 inch plinth with R UE support on plinth and L UE support on locked rollator and B UE support on rollator in standing. Multiple attempts needed on some reps (11/01/2021); 21 second from 18.5 inch plinth with B UE support on plinth. Walker positioned in front of him for safety but did not need it. (12/06/2021); 21 second from 18.5 inch plinth with no UE support. Walker positioned in front of him for safety but did not need it (01/15/2022); 15 seconds from 18.5 inch plinth with no UE support. Walker positioned in front of him for safety but did not need it (02/26/2022); 23 second from 18.5 inch plinth with R UE support on plinth. Walker positioned in front of him for safety. Unable to perform without UE support. Reports his R LE feels weak when he puts weight/pressure on it and it feels like a pinched nerve in his groin and hip (04/02/2022);  Goal status: IN PROGRESS  4.  Patient will ambulate equal or greater than 1200 feet on 6 Minute Walk Test with [facility]  to demonstrate improved household and community mobility as well as community and social participation.  Baseline: to be tested visit 2 as appropriate (11/01/2021); 800 feet with rollator. Low back pain increased to 4-5/10 and had pain down R LE to calf (11/13/2021); 943 feet with SPC and SBA (two little standing breaks to stretch back due to tightness at low back, increased sharper pain in back and B legs, 12/06/2021);  948 feet with SPC (01/09/2022); 1045 feet with SPC no rests  but catches toes on floor at times (01/15/2022); 936 feet with SBA and mostly carrying cane for first 4 min of test. Scuffing feet on floor slightly. No stumbles. Sharp pain developed around 4 min in right glute/hip region worse on initial contact that remained after seated rest from R glute to anterior thigh to knee and in toes (02/26/2022); 773 feet with SBA with rollator. R LE gave way once during test and  patient felt he could barely make it to the end. He recovered balance with use of B UE support on rollator. Reports sudden feeling that right LE will give way (04/02/2022);  Goal status: IN PROGRESS  5.  Complete community, work and/or recreational activities without limitation due to current condition.  Baseline: difficulty with bed mobility, transfers, driving, household and community ambulation, preventing falls, floor transfers, playing with his daughter, stairs, social participation and socializing with friends (11/01/2021); walking is getting easier, transfers are easier, doesn't have to use his rollator as much, stairs are still troublesome, has not tried floor transfer, still restricted socially and continues to have significant limitations with functional activities and mobility (12/06/2021); walking and transfers are easier and is using [place] at home instead of rollator, continues to be restricted socially and have significant limitations with functional activities, basic mobility, and balance (01/15/2022); driving is better and he is able to move his foot instead of his whole leg, still has not been able to get to the floor and back up, it is getting a little easier to do things around the house, still hard  to lift heavy boxes and lift his daughter, he can stand a little longer to do the dishes, vacuuming the floor is less difficult than before, he continues to have limitations in socialization and community mobility, still is concerned about falling, continues to have urgency with bowel and bladder (02/26/2022); everything has gotten a little harder around the house over the last couple of weeks due to right LE weakness, and groin, hip, low back pain (04/02/2022);  Goal status: IN PROGRESS   PLAN: PT FREQUENCY: 2x/week  PT DURATION: 12 weeks  PLANNED INTERVENTIONS: Therapeutic exercises, Therapeutic activity, Neuromuscular re-education, Balance training, Gait training, Patient/Family education, Joint  mobilization, Stair training, DME instructions, Dry Needling, and Electrical stimulation.  PLAN FOR NEXT SESSION: Update HEP as tolerated. Progressive LE, core, functional strengthening and balance exercises as tolerated and appropriate.    Everlean Alstrom. Graylon Good, PT, DPT 04/04/22, 8:00 PM  [facility] [address] [place], [place] [address] P: [phone] I F: [phone]

## 2022-04-09 ENCOUNTER — Ambulatory Visit: Payer: Medicare Other | Admitting: Physical Therapy

## 2022-04-11 ENCOUNTER — Telehealth: Payer: Self-pay | Admitting: Physical Therapy

## 2022-04-11 ENCOUNTER — Ambulatory Visit: Payer: Medicare Other | Admitting: Physical Therapy

## 2022-04-11 ENCOUNTER — Encounter: Payer: Self-pay | Admitting: Physical Therapy

## 2022-04-11 DIAGNOSIS — R2689 Other abnormalities of gait and mobility: Secondary | ICD-10-CM

## 2022-04-11 DIAGNOSIS — R296 Repeated falls: Secondary | ICD-10-CM

## 2022-04-11 DIAGNOSIS — M6281 Muscle weakness (generalized): Secondary | ICD-10-CM

## 2022-04-11 DIAGNOSIS — M5459 Other low back pain: Secondary | ICD-10-CM | POA: Diagnosis not present

## 2022-04-11 DIAGNOSIS — M546 Pain in thoracic spine: Secondary | ICD-10-CM

## 2022-04-11 DIAGNOSIS — R262 Difficulty in walking, not elsewhere classified: Secondary | ICD-10-CM

## 2022-04-11 NOTE — Telephone Encounter (Signed)
Called Coletta Memos at [facility] [phone]. Was directed to nurse where left VM thanking them for the pelvic floor PT referral but letting them know patient continues to have worsening R leg and B/B symptoms that I am concerned about that happened to coincide with the request for pelvic floor referral and that I think he may need further follow up from MD about the worsening symptoms.   Luretha Murphy. Ilsa Iha, PT, DPT 04/11/22, 11:48 AM  [facility] [address] [place], [place] [address] P: [phone] I F: [phone]

## 2022-04-11 NOTE — Therapy (Signed)
OUTPATIENT PHYSICAL THERAPY TREATMENT NOTE   Patient Name: Travis Fitzpatrick MRN: [id number] DOB:[birth year], 41 y.o., male Today's Date: 04/11/22     PT End of Session - 04/11/22 1815     Visit Number 15    Number of Visits 72    Date for PT Re-Evaluation 06/25/22    Authorization Type MEDICARE PART B reporting period from 04/02/2022    Progress Note Due on Visit 71    PT Start Time 1815    PT Stop Time 1900    PT Time Calculation (min) 45 min    Activity Tolerance Patient limited by pain    Behavior During Therapy [facility] for tasks assessed/performed              Past Medical History:  Diagnosis Date   Bowel trouble    urgency   Medical history non-contributory    Urinary urgency    Past Surgical History:  Procedure Laterality Date   BACK SURGERY  2010   CIRCUMCISION     LUMBAR LAMINECTOMY/DECOMPRESSION MICRODISCECTOMY  07/20/2011   Procedure: LUMBAR LAMINECTOMY/DECOMPRESSION MICRODISCECTOMY;  Surgeon: Winfield Cunas;  Location: [place] [facility];  Service: Neurosurgery;  Laterality: N/A;  right thoracotomy with thoracic eight-nine discectomy and fusion   LUMBAR LAMINECTOMY/DECOMPRESSION MICRODISCECTOMY Right 10/06/2021   Procedure: Right Lumbar Four-Five Microdiscectomy, Right Lumbar Five-Sacal One Foraminotomy;  Surgeon: Ashok Pall, MD;  Location: [place];  Service: Neurosurgery;  Laterality: Right;  3C/RM 21   THORACIC DISCECTOMY  07/16/2012   Procedure: THORACIC DISCECTOMY;  Surgeon: Winfield Cunas, MD;  Location: [place] [facility];  Service: Neurosurgery;  Laterality: Right;  RIGHT Thoracic seven-eight  thoracic diskectomy via thoracotomy by dr Cyndia Bent   THORACIC DISCECTOMY N/A 12/15/2014   Procedure: THORACIC SEVEN TO THORACIC NINE Laminectomy ;  Surgeon: Ashok Pall, MD;  Location: [place] [facility];  Service: Neurosurgery;  Laterality: N/A;   THORACIC DISCECTOMY N/A 06/06/2017   Procedure: LAMINECTOMY THORACIC NINE-TEN;  Surgeon: Ashok Pall, MD;  Location: [place];  Service:  Neurosurgery;  Laterality: N/A;  LAMINECTOMY THORACIC NINE-TEN   THORACOTOMY  07/20/2011   Procedure: THORACOTOMY OPEN FOR SPINE SURGERY;  Surgeon: Pierre Bali, MD;  Location: [facility];  Service: Vascular;  Laterality: N/A;   THORACOTOMY  07/16/2012   Procedure: THORACOTOMY OPEN FOR SPINE SURGERY;  Surgeon: Gaye Pollack, MD;  Location: [place] [facility];  Service: Thoracic;  Laterality: N/A;   Patient Active Problem List   Diagnosis Date Noted   HNP (herniated nucleus pulposus), lumbar 10/06/2021   Hand pain 06/28/2021   Lumbar facet arthropathy 01/11/2021   Thoracic spondylosis with myelopathy 01/11/2021   Thoracic spinal stenosis 06/06/2017   Stenosis, spinal, thoracic 12/15/2014   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 09/22/2014   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/2011    PCP: [place] [facility]  REFERRING PROVIDER: Ashok Pall, MD  REFERRING DIAG: 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  PERTINENT HISTORY: Patient is a 41 y.o. male who presents to outpatient physical therapy with a referral for medical diagnosis  intervertebral disc disorder of  thoracic region with myelopathy. This patient's chief complaints consist of low back pain, R LE pain/paresthesia/weakness, L LE weakness s/p right L4-5 laminotomy, microdiscectomy on 10/06/2021 leading to the following functional deficits: difficulty with bed mobility, transfers, driving, household and community ambulation, preventing  falls, floor transfers, playing with his daughter, stairs, social participation and socializing with friends. 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 Patient denies hx of cancer, stroke, seizures, lung problems, heart problems, diabetes, unexplained weight loss, and osteoporosis. Patient reports he has difficulty with making it to the bathroom on time when he gets bowel or bladder urges and has stumbling. He has history of thoracic and lumbar surgeries.  PRECAUTIONS: falls. No post-op restrictions after 4 months from surgery.   PATIENT GOALS: get better and get off the walker to walk with [facility], play with his daughter (DOB 07/04/2020), get his strength back, be able to get up and down from floor.  SUBJECTIVE: Patient arrives on his rollator. He states he felt much worse after last PT session and his condition was the worst on Sunday when he could not walk on his right LE at all. On Monday he was able to put some pressure on his right LE but it still felt like he was going to fall so he canceled his PT appointment that day. He states his right leg is painful, numb and weak and is worst when he tried to put pressure on the R LE. He has been trying to do his supine exercises and supine marching. He states his surgeon's office got back to him and said he has an appointment on 04/30/2022.    PAIN:  Are you having pain? 3/10 Location: low back and right groin, hip and down back of leg to calf  OBJECTIVE     TODAY'S TREATMENT  Therapeutic exercise: to centralize symptoms and improve ROM, strength, muscular endurance, and activity tolerance required for  successful completion of functional activities.  - NuStep level 1 using bilateral upper and lower extremities. Seat/handle setting 11/11. For improved extremity mobility, muscular endurance, and activity tolerance; and to induce the analgesic effect of aerobic exercise, stimulate improved joint nutrition, and prepare body structures and systems for following interventions. 5 min. Average SPM = 77. (Increasing R LE pain, discontinued). - single leg seated quad extension at [employer]ME[employer] machine L LE 3x10 at 15# (rated hard, no pain).  R LE 1x10 partial ROM 60-90 degrees least painful ROM (discontinued due to increasing pain).  - single leg hamstring curl at [facility] machine: L LE 3x10/10 at 15/25# (rated easy/hard, no pain) R LE 1x10 at 15# (increased pain and needed help for L LE at times).  - seated single arm row with 25# cable, 3x15 each side. R foot propped out to decrease pressure through R LE. (Does endorse some pain in R LE worse when pulling on R, tolerable).  - seated single arm overhead press, 3x10 each side  with 10#DB (mild back pain, tolerable). No back support, R foot propped to decrease pressure on R LE.  - seated single arm scaption, 2x10 each side with 5#DB, no back support, R foot propped to decrease pressure on R LE. (Discontinued due to increasing back pain). -  seated single arm shoulder extension from overhead flexion to approx 30 degrees flexion with abdominal brace, 3x10 each side with 5# cable. no back support, R foot propped to decrease pressure on R LE.    Pt required multimodal cuing for proper technique and to facilitate improved neuromuscular control, strength, range of motion, and functional ability resulting in improved performance and form.  HOME EXERCISE PROGRAM:  Access Code: [email or web address] URL: [email or web address] Date: 11/13/2021 Prepared by: Rosita Kea  Exercises - Sit to Stand  - 1 x weekly - 2-3 sets - 5-10 reps - Seated Isometric Hip Adduction with Ball   - 1 x daily - 2-3 sets - 5-10 reps - 5 seconds hold - Seated Toe Raise  - 1 x daily - 2-3 sets - 10 reps - Seated Single Arm Shoulder Row with Anchored Resistance  - 1 x daily - 3 sets - 10 reps  PATIENT EDUCATION:  Education details: Form/technique with exercise. Rational for interventions. POC. Progress  Person educated: Patient Education method: Explanation, Demonstration, Tactile cues, and Verbal cues Education comprehension: verbalized understanding, returned demonstration, verbal cues required, tactile cues required, and needs further education   ASSESSMENT:  CLINICAL IMPRESSION: Patient much worse over weekend and missed last PT session due to not being able to sufficiently tolerate weight bearing on R LE. Patient's doctor has been notified of worsening and patient is waiting to see him on 04/30/2022. Patient's function and ability to tolerate PT was limited limited by back and R LE pain, weakness, and paresthesia. Interventions today were modified to focus on working around the pain and doing exercises for core and L LE in seated position. He reported some discomfort in his back and/or R LE during theses activities he felt was tolerable. However, he had a lot of difficulty weight bearing through R LE upon standing after session and required increased B UE weight bearing to ambulate. Patient reported he felt comfortable going out to vehicle and driving home but wanted to sit in the vehicle until he felt ready. Plan to follow up with patient about tolerance for PT, but at this point may need to pause PT until he is able to return to his doctor to assess and address his worsening symptoms due to lack of tolerance for PT.  Patient would benefit from continued management of limiting condition by skilled physical therapist to address remaining impairments and functional limitations to work towards stated goals and return to PLOF or maximal functional independence.   OBJECTIVE IMPAIRMENTS Abnormal  gait, decreased activity tolerance, decreased balance, decreased coordination, decreased endurance, decreased knowledge of use of DME, decreased mobility, difficulty walking, decreased ROM, decreased strength, impaired perceived functional ability, impaired flexibility, impaired sensation, impaired tone, impaired UE functional use, postural dysfunction, obesity, and pain.   ACTIVITY LIMITATIONS cleaning, community activity, driving, occupation, laundry, shopping, and basic mobility such as bed mobility, transfers, driving, household and community ambulation, preventing falls, floor transfers, playing with his daughter, stairs, social participation and socializing with friends .   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    REHAB POTENTIAL: Good  CLINICAL DECISION MAKING: Evolving/moderate  complexity  EVALUATION COMPLEXITY: Moderate   GOALS: Goals reviewed with patient? No  SHORT TERM GOALS: Target date: 11/15/2021  Be independent with initial home exercise program for self-management of symptoms. Baseline: initial HEP to be provided at visit 2 as appropriate (11/01/2021);  Goal status: MET  LONG TERM GOALS: Target date: 01/24/2022; Updated to 04/09/2022 on 01/15/2022. UPDATED to 06/25/2022 for all unmet goals on 04/02/2022  Be independent with a long-term home exercise program for self-management of symptoms.  Baseline: Initial HEP to be provided at visit 2 as appropriate (11/01/2021); patient  currently participating well (12/06/2021; 01/15/2022; 02/26/2022); participating as able (04/02/2022);  Goal status: IN PROGRESS  2.  Demonstrate improved FOTO to equal or greater than 42 by visit #14 to demonstrate improvement in overall condition and self-reported functional ability.  Baseline: 26 (11/01/2021); 45 at visit #10 (12/06/2021); 53 at visit# 20 (01/15/2022); 50 at visit #30 (02/26/2022); 53 at visit #40 (04/02/2022);  Goal status: ACHIEVED 12/06/2021  3.  Patient will complete 5 Time Sit To Stand Test in equal or less than 15 seconds for 18.5 inch surface with no UE support to demonstrate improved ability to complete transfers and decrease fall risk.  Baseline: 45 second from 18.5 inch plinth with R UE support on plinth and L UE support on locked rollator and B UE support on rollator in standing. Multiple attempts needed on some reps (11/01/2021); 21 second from 18.5 inch plinth with B UE support on plinth. Walker positioned in front of him for safety but did not need it. (12/06/2021); 21 second from 18.5 inch plinth with no UE support. Walker positioned in front of him for safety but did not need it (01/15/2022); 15 seconds from 18.5 inch plinth with no UE support. Walker positioned in front of him for safety but did not need it (02/26/2022); 23 second from 18.5 inch plinth with R UE support on plinth. Walker positioned in front of him for safety. Unable to perform without UE support. Reports his R LE feels weak when he puts weight/pressure on it and it feels like a pinched nerve in his groin and hip (04/02/2022);  Goal status: IN PROGRESS  4.  Patient will ambulate equal or greater than 1200 feet on 6 Minute Walk Test with [facility]  to demonstrate improved household and community mobility as well as community and social participation.  Baseline: to be tested visit 2 as appropriate (11/01/2021); 800 feet with rollator. Low back pain increased to 4-5/10 and had pain down R LE to calf (11/13/2021); 943 feet with SPC  and SBA (two little standing breaks to stretch back due to tightness at low back, increased sharper pain in back and B legs, 12/06/2021);  948 feet with SPC (01/09/2022); 1045 feet with SPC no rests  but catches toes on floor at times (01/15/2022); 936 feet with SBA and mostly carrying cane for first 4 min of test. Scuffing feet on floor slightly. No stumbles. Sharp pain developed around 4 min in right glute/hip region worse on initial contact that remained after seated rest from R glute to anterior thigh to knee and in toes (02/26/2022); 773 feet with SBA with rollator. R LE gave way once during test and patient felt he could barely make it to the end. He recovered balance with use of B UE support on rollator. Reports sudden feeling that right LE will give way (04/02/2022);  Goal status: IN PROGRESS  5.  Complete community, work and/or recreational activities without limitation due to current condition.  Baseline: difficulty with bed mobility, transfers, driving, household and community ambulation, preventing falls, floor transfers, playing with his daughter, stairs, social participation and socializing with friends (11/01/2021); walking is getting easier, transfers are easier, doesn't have to use his rollator as much, stairs are still troublesome, has not tried floor transfer, still restricted socially and continues to have significant limitations with functional activities and mobility (12/06/2021); walking and transfers are easier and is using [place] at home instead of rollator, continues to be restricted socially and have significant limitations with functional activities, basic mobility, and balance (01/15/2022); driving is better and he is able to move his foot instead of his whole leg, still has not been able to get to the floor and back up, it is getting a little easier to do things around the house, still hard to lift heavy boxes and lift his daughter, he can stand a little longer to do the dishes, vacuuming the  floor is less difficult than before, he continues to have limitations in socialization and community mobility, still is concerned about falling, continues to have urgency with bowel and bladder (02/26/2022); everything has gotten a little harder around the house over the last couple of weeks due to right LE weakness, and groin, hip, low back pain (04/02/2022);  Goal status: IN PROGRESS   PLAN: PT FREQUENCY: 2x/week  PT DURATION: 12 weeks  PLANNED INTERVENTIONS: Therapeutic exercises, Therapeutic activity, Neuromuscular re-education, Balance training, Gait training, Patient/Family education, Joint mobilization, Stair training, DME instructions, Dry Needling, and Electrical stimulation.  PLAN FOR NEXT SESSION: Update HEP as tolerated. Progressive LE, core, functional strengthening and balance exercises as tolerated and appropriate.    Everlean Alstrom. Graylon Good, PT, DPT 04/11/22, 8:20 PM  [facility] [address] [place], [place] [address] P: [phone] I F: [phone]

## 2022-04-18 ENCOUNTER — Ambulatory Visit: Payer: Medicare Other | Attending: Neurosurgery | Admitting: Physical Therapy

## 2022-04-18 ENCOUNTER — Encounter: Payer: Self-pay | Admitting: Physical Therapy

## 2022-04-18 DIAGNOSIS — M6281 Muscle weakness (generalized): Secondary | ICD-10-CM

## 2022-04-18 DIAGNOSIS — R296 Repeated falls: Secondary | ICD-10-CM | POA: Diagnosis present

## 2022-04-18 DIAGNOSIS — R262 Difficulty in walking, not elsewhere classified: Secondary | ICD-10-CM | POA: Diagnosis present

## 2022-04-18 DIAGNOSIS — M5459 Other low back pain: Secondary | ICD-10-CM | POA: Diagnosis present

## 2022-04-18 DIAGNOSIS — R2689 Other abnormalities of gait and mobility: Secondary | ICD-10-CM

## 2022-04-18 DIAGNOSIS — M546 Pain in thoracic spine: Secondary | ICD-10-CM

## 2022-04-18 NOTE — Therapy (Signed)
OUTPATIENT PHYSICAL THERAPY TREATMENT NOTE   Patient Name: Travis Fitzpatrick MRN: [id number] DOB:[birth year], 41 y.o., male Today's Date: 04/18/22     PT End of Session - 04/18/22 1827     Visit Number 16    Number of Visits 34    Date for PT Re-Evaluation 06/25/22    Authorization Type MEDICARE PART B reporting period from 04/02/2022    Progress Note Due on Visit 29    PT Start Time 1825    PT Stop Time 1903    PT Time Calculation (min) 38 min    Activity Tolerance Patient limited by pain    Behavior During Therapy [facility] for tasks assessed/performed               Past Medical History:  Diagnosis Date   Bowel trouble    urgency   Medical history non-contributory    Urinary urgency    Past Surgical History:  Procedure Laterality Date   BACK SURGERY  2010   CIRCUMCISION     LUMBAR LAMINECTOMY/DECOMPRESSION MICRODISCECTOMY  07/20/2011   Procedure: LUMBAR LAMINECTOMY/DECOMPRESSION MICRODISCECTOMY;  Surgeon: Winfield Cunas;  Location: [place] [facility];  Service: Neurosurgery;  Laterality: N/A;  right thoracotomy with thoracic eight-nine discectomy and fusion   LUMBAR LAMINECTOMY/DECOMPRESSION MICRODISCECTOMY Right 10/06/2021   Procedure: Right Lumbar Four-Five Microdiscectomy, Right Lumbar Five-Sacal One Foraminotomy;  Surgeon: Ashok Pall, MD;  Location: [place];  Service: Neurosurgery;  Laterality: Right;  3C/RM 21   THORACIC DISCECTOMY  07/16/2012   Procedure: THORACIC DISCECTOMY;  Surgeon: Winfield Cunas, MD;  Location: [place] [facility];  Service: Neurosurgery;  Laterality: Right;  RIGHT Thoracic seven-eight  thoracic diskectomy via thoracotomy by dr Cyndia Bent   THORACIC DISCECTOMY N/A 12/15/2014   Procedure: THORACIC SEVEN TO THORACIC NINE Laminectomy ;  Surgeon: Ashok Pall, MD;  Location: [place] [facility];  Service: Neurosurgery;  Laterality: N/A;   THORACIC DISCECTOMY N/A 06/06/2017   Procedure: LAMINECTOMY THORACIC NINE-TEN;  Surgeon: Ashok Pall, MD;  Location: [place];  Service:  Neurosurgery;  Laterality: N/A;  LAMINECTOMY THORACIC NINE-TEN   THORACOTOMY  07/20/2011   Procedure: THORACOTOMY OPEN FOR SPINE SURGERY;  Surgeon: Pierre Bali, MD;  Location: [facility];  Service: Vascular;  Laterality: N/A;   THORACOTOMY  07/16/2012   Procedure: THORACOTOMY OPEN FOR SPINE SURGERY;  Surgeon: Gaye Pollack, MD;  Location: [place] [facility];  Service: Thoracic;  Laterality: N/A;   Patient Active Problem List   Diagnosis Date Noted   HNP (herniated nucleus pulposus), lumbar 10/06/2021   Hand pain 06/28/2021   Lumbar facet arthropathy 01/11/2021   Thoracic spondylosis with myelopathy 01/11/2021   Thoracic spinal stenosis 06/06/2017   Stenosis, spinal, thoracic 12/15/2014   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 09/22/2014   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/2011    PCP: [place] @ [facility]  REFERRING PROVIDER: Ashok Pall, MD  REFERRING DIAG: 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  PERTINENT HISTORY: Patient is a 41 y.o. male who presents to outpatient physical therapy with a referral for medical diagnosis  intervertebral disc disorder  of thoracic region with myelopathy. This patient's chief complaints consist of low back pain, R LE pain/paresthesia/weakness, L LE weakness s/p right L4-5 laminotomy, microdiscectomy on 10/06/2021 leading to the following functional deficits: difficulty with bed mobility, transfers, driving, household and community ambulation, preventing  falls, floor transfers, playing with his daughter, stairs, social participation and socializing with friends. 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 Patient denies hx of cancer, stroke, seizures, lung problems, heart problems, diabetes, unexplained weight loss, and osteoporosis. Patient reports he has difficulty with making it to the bathroom on time when he gets bowel or bladder urges and has stumbling. He has history of thoracic and lumbar surgeries.  PRECAUTIONS: falls. No post-op restrictions after 4 months from surgery.   PATIENT GOALS: get better and get off the walker to walk with [facility], play with his daughter (DOB 07/04/2020), get his strength back, be able to get up and down from floor.  SUBJECTIVE: Patient arrives with his rollator. He states he has been taking it easy and his right sided pain has been getting better but seems to get worse again the  more he puts pressure on his R LE. He states he had some increased back pain after last PT session but it wasn't too bad. He rates his pain today at 3/10 in the low back, right groin, posterior hip, and posterior thigh.    PAIN:  Are you having pain? 3/10 Location: low back and right groin, hip and down back of leg to calf  OBJECTIVE     TODAY'S TREATMENT  Therapeutic exercise: to centralize symptoms and improve ROM, strength, muscular endurance, and activity tolerance required for successful completion of functional activities.  - NuStep level 5  using bilateral upper and lower extremities. Seat/handle setting 11/11. For improved extremity mobility, muscular endurance, and activity  tolerance; and to induce the analgesic effect of aerobic exercise, stimulate improved joint nutrition, and prepare body structures and systems for following interventions. 6. Average SPM = 66. (Increasing R lower leg pain).  - single leg seated quad extension at OMEGA machine L LE 3x10 at 20/25/30# (last two reps poor quality). R LE 3x10 at 5#  - single leg hamstring curl at [facility] machine: L LE 3x10 at 20# (difficulty with last ROM) R LE 3x10 at 15# (weight of L LE helping).   - seated single arm row with 25/30/35# cable, 3x10, no back support. (Does endorse some pain in R LE worse when pulling on R, tolerable).  - seated single arm shoulder extension from overhead flexion to approx 30 degrees flexion with abdominal brace, 3x10 each side with 5# cable. no back support, R foot propped to decrease pressure on R LE.  - seated single arm scaption, 3x10 each side with 5#DB, no back support, R foot propped to decrease pressure on R LE. (Pain on R side).  - seated single arm overhead press, discontinued after 4 reps on R with 10# DB due to increasing R groin pain.   Pt required multimodal cuing for proper technique and to facilitate improved neuromuscular control, strength, range of motion, and functional ability resulting in improved performance and form.  HOME EXERCISE PROGRAM:  Access Code: [email or web address] URL: [email or web address] Date:  11/13/2021 Prepared by: Rosita Kea  Exercises - Sit to Stand  - 1 x weekly - 2-3 sets - 5-10 reps - Seated Isometric Hip Adduction with Ball  - 1 x daily - 2-3 sets - 5-10 reps - 5 seconds hold - Seated Toe Raise  - 1 x daily - 2-3 sets - 10 reps - Seated Single Arm Shoulder Row with Anchored Resistance  - 1 x daily - 3 sets - 10 reps  PATIENT EDUCATION:  Education details: Form/technique with exercise. Rational for interventions.  Person educated: Patient Education method: Explanation, Demonstration, Tactile cues, and Verbal cues Education  comprehension: verbalized understanding, returned demonstration, verbal cues required, tactile cues required, and needs further education   ASSESSMENT:  CLINICAL IMPRESSION: Patient arrives with improved pain and tolerance to exercise today but continues to be limited by intolerance for exercises with weight bearing through R LE. Continued with similar exercises for core and lower extremities with progressions as tolerated. Patient is waiting for follow up with surgeon to address worsening R LE function and pain. Plan to continue with exercises to support his recover and maximize function within tolerance. Patient would benefit from continued management of limiting condition by skilled physical therapist to address remaining impairments and functional limitations to work towards stated goals and return to PLOF or maximal functional independence.    OBJECTIVE IMPAIRMENTS Abnormal gait, decreased activity tolerance, decreased balance, decreased coordination, decreased endurance, decreased knowledge of use of DME, decreased mobility, difficulty walking, decreased ROM, decreased strength, impaired perceived functional ability, impaired flexibility, impaired sensation, impaired tone, impaired UE functional use, postural dysfunction, obesity, and pain.   ACTIVITY LIMITATIONS cleaning, community activity, driving, occupation, laundry, shopping, and basic mobility such as bed mobility, transfers, driving, household and community ambulation, preventing falls, floor transfers, playing with his daughter, stairs, social participation and socializing with friends .   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    REHAB POTENTIAL: Good  CLINICAL DECISION MAKING: Evolving/moderate complexity  EVALUATION COMPLEXITY: Moderate   GOALS: Goals reviewed with patient? No  SHORT TERM GOALS: Target date: 11/15/2021  Be independent with initial home exercise program for self-management of symptoms. Baseline: initial HEP to be provided at visit 2 as appropriate (11/01/2021);  Goal status: MET  LONG TERM GOALS: Target date: 01/24/2022; Updated to 04/09/2022 on 01/15/2022. UPDATED to 06/25/2022 for all unmet goals on 04/02/2022  Be independent with a long-term home exercise program for self-management of symptoms.  Baseline: Initial HEP to be provided at visit 2 as appropriate (11/01/2021); patient currently participating well (12/06/2021; 01/15/2022; 02/26/2022); participating as able (04/02/2022);  Goal status: IN PROGRESS  2.  Demonstrate improved FOTO to equal or greater than 42 by visit #14 to demonstrate improvement in overall condition and self-reported functional ability.  Baseline: 26 (11/01/2021); 45 at visit #10 (12/06/2021); 53 at visit# 20 (01/15/2022); 50 at visit #30 (02/26/2022); 53 at visit #40 (04/02/2022);  Goal status: ACHIEVED 12/06/2021  3.  Patient will complete 5 Time Sit To Stand Test in equal or less than 15 seconds for 18.5 inch surface with no UE support to demonstrate improved  ability to complete transfers and decrease fall risk.  Baseline: 45 second from 18.5 inch plinth with R UE support on plinth and L UE support on locked rollator and B UE support on rollator in standing. Multiple attempts needed on some reps (11/01/2021); 21 second from 18.5 inch plinth with B UE support on plinth. Walker positioned in front of him for safety but did not need it. (12/06/2021); 21 second from 18.5 inch plinth  with no UE support. Walker positioned in front of him for safety but did not need it (01/15/2022); 15 seconds from 18.5 inch plinth with no UE support. Walker positioned in front of him for safety but did not need it (02/26/2022); 23 second from 18.5 inch plinth with R UE support on plinth. Walker positioned in front of him for safety. Unable to perform without UE support. Reports his R LE feels weak when he puts weight/pressure on it and it feels like a pinched nerve in his groin and hip (04/02/2022);  Goal status: IN PROGRESS  4.  Patient will ambulate equal or greater than 1200 feet on 6 Minute Walk Test with [facility]  to demonstrate improved household and community mobility as well as community and social participation.  Baseline: to be tested visit 2 as appropriate (11/01/2021); 800 feet with rollator. Low back pain increased to 4-5/10 and had pain down R LE to calf (11/13/2021); 943 feet with SPC and SBA (two little standing breaks to stretch back due to tightness at low back, increased sharper pain in back and B legs, 12/06/2021);  948 feet with SPC (01/09/2022); 1045 feet with SPC no rests  but catches toes on floor at times (01/15/2022); 936 feet with SBA and mostly carrying cane for first 4 min of test. Scuffing feet on floor slightly. No stumbles. Sharp pain developed around 4 min in right glute/hip region worse on initial contact that remained after seated rest from R glute to anterior thigh to knee and in toes (02/26/2022); 773 feet with SBA with rollator. R LE gave way once during test and patient felt he could barely make it to the end. He recovered balance with use of B UE support on rollator. Reports sudden feeling that right LE will give way (04/02/2022);  Goal status: IN PROGRESS  5.  Complete community, work and/or recreational activities without limitation due to current condition.  Baseline: difficulty with bed mobility, transfers, driving, household and community ambulation, preventing falls, floor  transfers, playing with his daughter, stairs, social participation and socializing with friends (11/01/2021); walking is getting easier, transfers are easier, doesn't have to use his rollator as much, stairs are still troublesome, has not tried floor transfer, still restricted socially and continues to have significant limitations with functional activities and mobility (12/06/2021); walking and transfers are easier and is using [place] at home instead of rollator, continues to be restricted socially and have significant limitations with functional activities, basic mobility, and balance (01/15/2022); driving is better and he is able to move his foot instead of his whole leg, still has not been able to get to the floor and back up, it is getting a little easier to do things around the house, still hard to lift heavy boxes and lift his daughter, he can stand a little longer to do the dishes, vacuuming the floor is less difficult than before, he continues to have limitations in socialization and community mobility, still is concerned about falling, continues to have urgency with bowel and bladder (02/26/2022); everything has gotten a  little harder around the house over the last couple of weeks due to right LE weakness, and groin, hip, low back pain (04/02/2022);  Goal status: IN PROGRESS   PLAN: PT FREQUENCY: 2x/week  PT DURATION: 12 weeks  PLANNED INTERVENTIONS: Therapeutic exercises, Therapeutic activity, Neuromuscular re-education, Balance training, Gait training, Patient/Family education, Joint mobilization, Stair training, DME instructions, Dry Needling, and Electrical stimulation.  PLAN FOR NEXT SESSION: Update HEP as tolerated. Progressive LE, core, functional strengthening and balance exercises as tolerated and appropriate.    Everlean Alstrom. Graylon Good, PT, DPT 04/18/22, 8:08 PM  [facility] [address] [place], [place] [address] P: [phone] I F: [phone]

## 2022-04-19 ENCOUNTER — Telehealth: Payer: Self-pay | Admitting: Physical Therapy

## 2022-04-19 NOTE — Telephone Encounter (Signed)
Took call from Dr. Franky Macho who said this is the first he had heard of patient having trouble and wanted a description of the worsening problems the patient is having, especially if it is just pain or pain and strength. I let him know I had documented calls to his office at least 2 times and the patient had told me he also tried to contact their office. He said he had no record of it and trusts his staff. I went over the dates and outcomes of the documented calls with him and that the patient had told me he had an appointment on 9/18 as the result of his calls. I described the worsening symptoms including the fall in the clinic and let him know it is painful but also weak with giving way. I let him know patient's symptoms fluctuate but this time have continued to get and stay worse so he cannot tolerate much weight bearing on R LE so we have been working around the problem in seated exercises. He said the patient needed a scan and approved continuing PT as able at this time.   Luretha Murphy. Ilsa Iha, PT, DPT 04/19/22, 6:47 PM  [facility] [address] [place], [place] [address] P: [phone] I F: [phone]  Called patient to let him know I had spoken with Dr. Franky Macho and he wanted the patient to get a scan and continue PT in the mean time. Patient wanted to know how to get the scan and I said I expected Dr. Sueanne Margarita office to call him to set it up or if he doesn't hear anything to call them.   Luretha Murphy. Ilsa Iha, PT, DPT 04/19/22, 6:48 PM  [facility] [address] [place], [place] [address] P: [phone] I F: [phone]

## 2022-04-20 ENCOUNTER — Other Ambulatory Visit: Payer: Self-pay | Admitting: Neurosurgery

## 2022-04-20 ENCOUNTER — Other Ambulatory Visit: Payer: Self-pay | Admitting: Physical Medicine & Rehabilitation

## 2022-04-20 DIAGNOSIS — M4714 Other spondylosis with myelopathy, thoracic region: Secondary | ICD-10-CM

## 2022-04-20 DIAGNOSIS — M4804 Spinal stenosis, thoracic region: Secondary | ICD-10-CM

## 2022-04-20 DIAGNOSIS — M5104 Intervertebral disc disorders with myelopathy, thoracic region: Secondary | ICD-10-CM

## 2022-04-20 DIAGNOSIS — M47816 Spondylosis without myelopathy or radiculopathy, lumbar region: Secondary | ICD-10-CM

## 2022-04-20 DIAGNOSIS — M5126 Other intervertebral disc displacement, lumbar region: Secondary | ICD-10-CM

## 2022-04-23 ENCOUNTER — Ambulatory Visit: Payer: Medicare Other | Admitting: Physical Therapy

## 2022-04-23 ENCOUNTER — Encounter: Payer: Self-pay | Admitting: Physical Therapy

## 2022-04-23 DIAGNOSIS — M546 Pain in thoracic spine: Secondary | ICD-10-CM

## 2022-04-23 DIAGNOSIS — R262 Difficulty in walking, not elsewhere classified: Secondary | ICD-10-CM

## 2022-04-23 DIAGNOSIS — M5459 Other low back pain: Secondary | ICD-10-CM | POA: Diagnosis not present

## 2022-04-23 DIAGNOSIS — M6281 Muscle weakness (generalized): Secondary | ICD-10-CM

## 2022-04-23 DIAGNOSIS — R296 Repeated falls: Secondary | ICD-10-CM

## 2022-04-23 DIAGNOSIS — R2689 Other abnormalities of gait and mobility: Secondary | ICD-10-CM

## 2022-04-23 NOTE — Therapy (Signed)
OUTPATIENT PHYSICAL THERAPY TREATMENT NOTE   Patient Name: Travis Fitzpatrick MRN: [id number] DOB:1980/10/12, 41 y.o., male Today's Date: 04/23/22     PT End of Session - 04/23/22 1849     Visit Number 17    Number of Visits 31    Date for PT Re-Evaluation 06/25/22    Authorization Type MEDICARE PART B reporting period from 04/02/2022    Progress Note Due on Visit 40    PT Start Time 1815    PT Stop Time 1855    PT Time Calculation (min) 40 min    Activity Tolerance Patient limited by pain;Patient tolerated treatment well    Behavior During Therapy [facility] for tasks assessed/performed             Past Medical History:  Diagnosis Date   Bowel trouble    urgency   Medical history non-contributory    Urinary urgency    Past Surgical History:  Procedure Laterality Date   BACK SURGERY  2010   CIRCUMCISION     LUMBAR LAMINECTOMY/DECOMPRESSION MICRODISCECTOMY  07/20/2011   Procedure: LUMBAR LAMINECTOMY/DECOMPRESSION MICRODISCECTOMY;  Surgeon: Winfield Cunas;  Location: [place] [facility];  Service: Neurosurgery;  Laterality: N/A;  right thoracotomy with thoracic eight-nine discectomy and fusion   LUMBAR LAMINECTOMY/DECOMPRESSION MICRODISCECTOMY Right 10/06/2021   Procedure: Right Lumbar Four-Five Microdiscectomy, Right Lumbar Five-Sacal One Foraminotomy;  Surgeon: Ashok Pall, MD;  Location: [place];  Service: Neurosurgery;  Laterality: Right;  3C/RM 21   THORACIC DISCECTOMY  07/16/2012   Procedure: THORACIC DISCECTOMY;  Surgeon: Winfield Cunas, MD;  Location: [place] [facility];  Service: Neurosurgery;  Laterality: Right;  RIGHT Thoracic seven-eight  thoracic diskectomy via thoracotomy by dr Cyndia Bent   THORACIC DISCECTOMY N/A 12/15/2014   Procedure: THORACIC SEVEN TO THORACIC NINE Laminectomy ;  Surgeon: Ashok Pall, MD;  Location: [place] [facility];  Service: Neurosurgery;  Laterality: N/A;   THORACIC DISCECTOMY N/A 06/06/2017   Procedure: LAMINECTOMY THORACIC NINE-TEN;  Surgeon: Ashok Pall, MD;   Location: [place];  Service: Neurosurgery;  Laterality: N/A;  LAMINECTOMY THORACIC NINE-TEN   THORACOTOMY  07/20/2011   Procedure: THORACOTOMY OPEN FOR SPINE SURGERY;  Surgeon: Pierre Bali, MD;  Location: [facility];  Service: Vascular;  Laterality: N/A;   THORACOTOMY  07/16/2012   Procedure: THORACOTOMY OPEN FOR SPINE SURGERY;  Surgeon: Gaye Pollack, MD;  Location: [place] [facility];  Service: Thoracic;  Laterality: N/A;   Patient Active Problem List   Diagnosis Date Noted   HNP (herniated nucleus pulposus), lumbar 10/06/2021   Hand pain 06/28/2021   Lumbar facet arthropathy 01/11/2021   Thoracic spondylosis with myelopathy 01/11/2021   Thoracic spinal stenosis 06/06/2017   Stenosis, spinal, thoracic 12/15/2014   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  PERTINENT HISTORY: Patient is a 41 y.o. male who presents to outpatient physical therapy with a referral for medical diagnosis  intervertebral disc  disorder of thoracic region with myelopathy. This patient's chief complaints consist of low back pain, R LE pain/paresthesia/weakness, L LE weakness s/p right L4-5 laminotomy, microdiscectomy on 10/06/2021 leading to the following functional deficits: difficulty with bed mobility, transfers, driving, household and  community ambulation, preventing falls, floor transfers, playing with his daughter, stairs, social participation and socializing with friends. 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 Patient denies hx of cancer, stroke, seizures, lung problems, heart problems, diabetes, unexplained weight loss, and osteoporosis. Patient reports he has difficulty with making it to the bathroom on time when he gets bowel or bladder urges and has stumbling. He has history of thoracic and lumbar surgeries.  PRECAUTIONS: falls. No post-op restrictions after 4 months from surgery.   PATIENT GOALS: get better and get off the walker to walk with [facility], play with his daughter (DOB 07/04/2020), get his strength back, be able to get up and down from floor.  SUBJECTIVE: Patient arrives with his rollator. He states after his last PT session he went home and did some mat exercises and felt okay. His R LE is about the same and he continues to have increased pain and debility correlated to how much weightbearing he does. Dr. Christella Noa called and spoke with PT since last PT visit and said patient needed a scan. The hospital called patient and scheduled an MRI on 04/26/2022. Dr. Christella Noa also wanted patient to continue PT working around his right LE pain as able.    PAIN:  Are you having pain? 2-3/10 Location: low back and right groin, hip and down back of leg to calf  OBJECTIVE     TODAY'S TREATMENT  Therapeutic exercise: to centralize symptoms and improve ROM, strength, muscular endurance, and activity tolerance required for successful completion of functional activities.  -  NuStep level 4-5 using bilateral upper and lower extremities. Seat/handle setting 11/11. For improved extremity mobility, muscular endurance, and activity tolerance; and to induce the analgesic effect of aerobic exercise, stimulate improved joint nutrition, and prepare body structures and systems for following interventions. 6. Average SPM = 69. (Increasing R lower leg pain).  - single leg seated quad extension at [place] 3x10/8/10 at 25/30/30# (last two reps poor quality). R LE 3x10 at 5# (painful and weak, some reps incomplete).   - single leg hamstring curl at [facility] machine: L LE 1x10 at 15# (difficulty with last ROM) B LE 3x10 at 20# (25# too heavy).   - seated single arm row with 35# cable, 3x10, o back support, R foot propped to decrease pressure on R LE (Does endorse some pain in R LE worse when pulling on R, tolerable). - seated single arm overhead press, 3x10 each side with 5# DB, no back support, R foot propped to decrease pressure on R LE. (Pain in right hip/groin, tolerable).  - seated single arm shoulder extension from overhead flexion to approx 30 degrees flexion with abdominal brace, 3x10 each side with 5# cable. no back support, R foot propped to decrease pressure on R LE. (Painful but tolerable, R > L).  - seated single arm scaption, 3x10 each side with 5#DB, no back support, R foot propped to decrease pressure on R LE. (  Pain on R side).  - seated overhead row, B UE, 3x10 at 20#. (Increased back and R groin pain, tolerable).   Pt required multimodal cuing for proper technique and to facilitate improved neuromuscular control, strength, range of motion, and functional ability resulting in improved performance and form.  HOME EXERCISE PROGRAM:  Access Code: [email or web address] URL: [email or web address] Date: 11/13/2021 Prepared by: Rosita Kea  Exercises - Sit to Stand  - 1 x weekly - 2-3 sets - 5-10 reps - Seated Isometric Hip Adduction with Ball  - 1 x daily -  2-3 sets - 5-10 reps - 5 seconds hold - Seated Toe Raise  - 1 x daily - 2-3 sets - 10 reps - Seated Single Arm Shoulder Row with Anchored Resistance  - 1 x daily - 3 sets - 10 reps  PATIENT EDUCATION:  Education details: Form/technique with exercise. Rational for interventions.  Person educated: Patient Education method: Explanation, Demonstration, Tactile cues, and Verbal cues Education comprehension: verbalized understanding, returned demonstration, verbal cues required, tactile cues required, and needs further education   ASSESSMENT:  CLINICAL IMPRESSION: Patient arrives with minimal change in condition since last PT session. Continued with seated core, LE, and functional strengthening exercises to continue improving condition without irritating R LE pain beyond tolerable level. Patient continues to have R LE pain and weakness with weight bearing and pressure to that side that severely limits his mobility. Patient had tolerable increase in pain in his R LE as well as back by end of session, but felt he could "deal with it." Patient would benefit from continued management of limiting condition by skilled physical therapist to address remaining impairments and functional limitations to work towards stated goals and return to PLOF or maximal functional independence.  OBJECTIVE IMPAIRMENTS Abnormal gait, decreased activity tolerance, decreased balance, decreased coordination, decreased endurance, decreased knowledge of use of DME, decreased mobility, difficulty walking, decreased ROM, decreased strength, impaired perceived functional ability, impaired flexibility, impaired sensation, impaired tone, impaired UE functional use, postural dysfunction, obesity, and pain.   ACTIVITY LIMITATIONS cleaning, community activity, driving, occupation, laundry, shopping, and basic mobility such as bed mobility, transfers, driving, household and community ambulation, preventing falls, floor transfers, playing with  his daughter, stairs, social participation and socializing with friends .   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    REHAB POTENTIAL: Good  CLINICAL DECISION MAKING: Evolving/moderate complexity  EVALUATION COMPLEXITY: Moderate   GOALS: Goals reviewed with patient? No  SHORT TERM GOALS: Target date: 11/15/2021  Be independent with initial home exercise program for self-management of symptoms. Baseline: initial HEP to be provided at visit 2 as appropriate (11/01/2021);  Goal status: MET  LONG TERM GOALS: Target date: 01/24/2022; Updated to 04/09/2022 on 01/15/2022. UPDATED to 06/25/2022 for all unmet goals on 04/02/2022  Be independent with a long-term home exercise program for self-management of symptoms.  Baseline: Initial HEP to be provided at visit 2 as appropriate (11/01/2021); patient currently participating well (12/06/2021; 01/15/2022; 02/26/2022); participating as able (04/02/2022);  Goal status: IN PROGRESS  2.  Demonstrate improved FOTO to equal or greater than 42 by visit #14  to demonstrate improvement in overall condition and self-reported functional ability.  Baseline: 26 (11/01/2021); 45 at visit #10 (12/06/2021); 53 at visit# 20 (01/15/2022); 50 at visit #30 (02/26/2022); 53 at visit #40 (04/02/2022);  Goal status: ACHIEVED 12/06/2021  3.  Patient will complete 5 Time Sit To Stand Test in equal or less than 15 seconds for 18.5 inch surface with no UE support to demonstrate improved  ability to complete transfers and decrease fall risk.  Baseline: 45 second from 18.5 inch plinth with R UE support on plinth and L UE support on locked rollator and B UE support on rollator in standing. Multiple attempts needed on some reps (11/01/2021); 21 second from 18.5 inch plinth with B UE support on plinth. Walker positioned in front of him for safety but did not need it. (12/06/2021); 21 second from 18.5 inch plinth with no UE support. Walker positioned in front of him for safety but did not need it (01/15/2022); 15 seconds from 18.5 inch plinth with no UE support. Walker positioned in front of him for safety but did not need it (02/26/2022); 23 second from 18.5 inch plinth with R UE support on plinth. Walker positioned in front of him for safety. Unable to perform without UE support. Reports his R LE feels weak when he puts weight/pressure on it and it feels like a pinched nerve in his groin and hip (04/02/2022);  Goal status: IN PROGRESS  4.  Patient will ambulate equal or greater than 1200 feet on 6 Minute Walk Test with [facility]  to demonstrate improved household and community mobility as well as community and social participation.  Baseline: to be tested visit 2 as appropriate (11/01/2021); 800 feet with rollator. Low back pain increased to 4-5/10 and had pain down R LE to calf (11/13/2021); 943 feet with SPC and SBA (two little standing breaks to stretch back due to tightness at low back, increased sharper pain in back and B legs, 12/06/2021);  948 feet with SPC (01/09/2022); 1045 feet with SPC no rests  but catches toes on floor at times (01/15/2022); 936 feet with SBA and mostly carrying cane for first 4 min of test. Scuffing feet on floor slightly. No stumbles. Sharp pain developed around 4 min in right glute/hip region worse on initial contact that remained after seated rest from R glute to anterior thigh to knee and in toes (02/26/2022); 773 feet with SBA with rollator. R LE gave way once during test and  patient felt he could barely make it to the end. He recovered balance with use of B UE support on rollator. Reports sudden feeling that right LE will give way (04/02/2022);  Goal status: IN PROGRESS  5.  Complete community, work and/or recreational activities without limitation due to current condition.  Baseline: difficulty with bed mobility, transfers, driving, household and community ambulation, preventing falls, floor transfers, playing with his daughter, stairs, social participation and socializing with friends (11/01/2021); walking is getting easier, transfers are easier, doesn't have to use his rollator as much, stairs are still troublesome, has not tried floor transfer, still restricted socially and continues to have significant limitations with functional activities and mobility (12/06/2021); walking and transfers are easier and is using [place] at home instead of rollator, continues to be restricted socially and have significant limitations with functional activities, basic mobility, and balance (01/15/2022); driving is better and he is able to move his foot instead of his whole leg, still has not been able to get to the floor and back  up, it is getting a little easier to do things around the house, still hard to lift heavy boxes and lift his daughter, he can stand a little longer to do the dishes, vacuuming the floor is less difficult than before, he continues to have limitations in socialization and community mobility, still is concerned about falling, continues to have urgency with bowel and bladder (02/26/2022); everything has gotten a little harder around the house over the last couple of weeks due to right LE weakness, and groin, hip, low back pain (04/02/2022);  Goal status: IN PROGRESS   PLAN: PT FREQUENCY: 2x/week  PT DURATION: 12 weeks  PLANNED INTERVENTIONS: Therapeutic exercises, Therapeutic activity, Neuromuscular re-education, Balance training, Gait training, Patient/Family education, Joint  mobilization, Stair training, DME instructions, Dry Needling, and Electrical stimulation.  PLAN FOR NEXT SESSION: Update HEP as tolerated. Progressive LE, core, functional strengthening and balance exercises as tolerated and appropriate.    Everlean Alstrom. Graylon Good, PT, DPT 04/23/22, 6:58 PM  [facility] [address] [place], [place] [address] P: [phone] I F: [phone]

## 2022-04-25 ENCOUNTER — Ambulatory Visit: Payer: Medicare Other | Admitting: Physical Therapy

## 2022-04-25 NOTE — Therapy (Signed)
[place] [facility] [address] [place], [address] Phone: [phone]   Fax:  [phone]  Patient Details  Name: Travis Fitzpatrick MRN: [id number] Date of Birth: [birth year] Referring Provider:  Coletta Memos, MD  Encounter Date: 04/25/2022  Patient arrived on his rollator for his PT appointment scheduled at 6:15pm today. Patient reports his back and right LE have been very flared up since last PT session and it is gradually improving towards his recent baseline but is still really hurting him. He barely slept the last two nights and he is having a lot of trouble weight bearing on his R LE due to pain and weakness. He has an MRI scheduled for tomorrow and a follow up with Dr. Franky Macho next Tuesday. After discussion with PT, patient elected not to stay for PT session due to increased pain and lack of activity tolerance. PT has been unsuccessful at pain relief attempts in the past with similar exacerbations of pain and risk of making it worse outweighs potential to feel better from today's session in this PT's opinion. Patient advised to continue resting to allow pain to improve. Patient rates back, R glute, sacrum, and R LE pain at 4/10. Patient was not seen for PT visit today, and his visit was canceled.   Luretha Murphy. Ilsa Iha, PT, DPT 04/25/22, 6:28 PM  [facility] [address] [place], [place] [address] P: [phone] I F: [phone]

## 2022-04-26 ENCOUNTER — Ambulatory Visit
Admission: RE | Admit: 2022-04-26 | Discharge: 2022-04-26 | Disposition: A | Discharge status: Home / Self Care | Payer: Medicare Other | Source: Ambulatory Visit | Attending: Neurosurgery | Admitting: Neurosurgery

## 2022-04-26 DIAGNOSIS — M5126 Other intervertebral disc displacement, lumbar region: Secondary | ICD-10-CM | POA: Diagnosis present

## 2022-04-26 MED ORDER — GADOBUTROL 1 MMOL/ML IV SOLN
10.0000 mL | Freq: Once | INTRAVENOUS | Status: AC | PRN
Start: 2022-04-26 — End: 2022-04-26
  Administered 2022-04-26: 10 mL via INTRAVENOUS

## 2022-04-30 ENCOUNTER — Encounter: Payer: Self-pay | Admitting: Physical Therapy

## 2022-04-30 ENCOUNTER — Ambulatory Visit: Payer: Medicare Other | Admitting: Physical Therapy

## 2022-04-30 DIAGNOSIS — M5459 Other low back pain: Secondary | ICD-10-CM

## 2022-04-30 DIAGNOSIS — M6281 Muscle weakness (generalized): Secondary | ICD-10-CM

## 2022-04-30 DIAGNOSIS — R2689 Other abnormalities of gait and mobility: Secondary | ICD-10-CM

## 2022-04-30 DIAGNOSIS — R262 Difficulty in walking, not elsewhere classified: Secondary | ICD-10-CM

## 2022-04-30 DIAGNOSIS — M546 Pain in thoracic spine: Secondary | ICD-10-CM

## 2022-04-30 DIAGNOSIS — R296 Repeated falls: Secondary | ICD-10-CM

## 2022-04-30 NOTE — Therapy (Signed)
OUTPATIENT PHYSICAL THERAPY TREATMENT NOTE   Patient Name: Travis Fitzpatrick MRN: [id number] DOB:12-07-1980, 41 y.o., male Today's Date: 04/30/22     PT End of Session - 04/30/22 1834     Visit Number 18    Number of Visits 20    Date for PT Re-Evaluation 06/25/22    Authorization Type MEDICARE PART B reporting period from 04/02/2022    Progress Note Due on Visit 40    PT Start Time 1820    PT Stop Time 1900    PT Time Calculation (min) 40 min    Activity Tolerance Patient limited by pain;Patient tolerated treatment well    Behavior During [facility] for tasks assessed/performed              Past Medical History:  Diagnosis Date   Bowel trouble    urgency   Medical history non-contributory    Urinary urgency    Past Surgical History:  Procedure Laterality Date   BACK SURGERY  2010   CIRCUMCISION     LUMBAR LAMINECTOMY/DECOMPRESSION MICRODISCECTOMY  07/20/2011   Procedure: LUMBAR LAMINECTOMY/DECOMPRESSION MICRODISCECTOMY;  Surgeon: Winfield Cunas;  Location: [facility];  Service: Neurosurgery;  Laterality: N/A;  right thoracotomy with thoracic eight-nine discectomy and fusion   LUMBAR LAMINECTOMY/DECOMPRESSION MICRODISCECTOMY Right 10/06/2021   Procedure: Right Lumbar Four-Five Microdiscectomy, Right Lumbar Five-Sacal One Foraminotomy;  Surgeon: Ashok Pall, MD;  Location: [place];  Service: Neurosurgery;  Laterality: Right;  3C/RM 21   THORACIC DISCECTOMY  07/16/2012   Procedure: THORACIC DISCECTOMY;  Surgeon: Winfield Cunas, MD;  Location: [place];  Service: Neurosurgery;  Laterality: Right;  RIGHT Thoracic seven-eight  thoracic diskectomy via thoracotomy by dr Cyndia Bent   THORACIC DISCECTOMY N/A 12/15/2014   Procedure: THORACIC SEVEN TO THORACIC NINE Laminectomy ;  Surgeon: Ashok Pall, MD;  Location: [place] [facility];  Service: Neurosurgery;  Laterality: N/A;   THORACIC DISCECTOMY N/A 06/06/2017   Procedure: LAMINECTOMY THORACIC NINE-TEN;  Surgeon: Ashok Pall, MD;   Location: [place];  Service: Neurosurgery;  Laterality: N/A;  LAMINECTOMY THORACIC NINE-TEN   THORACOTOMY  07/20/2011   Procedure: THORACOTOMY OPEN FOR SPINE SURGERY;  Surgeon: Pierre Bali, MD;  Location: [facility];  Service: Vascular;  Laterality: N/A;   THORACOTOMY  07/16/2012   Procedure: THORACOTOMY OPEN FOR SPINE SURGERY;  Surgeon: Gaye Pollack, MD;  Location: [facility];  Service: Thoracic;  Laterality: N/A;   Patient Active Problem List   Diagnosis Date Noted   HNP (herniated nucleus pulposus), lumbar 10/06/2021   Hand pain 06/28/2021   Lumbar facet arthropathy 01/11/2021   Thoracic spondylosis with myelopathy 01/11/2021   Thoracic spinal stenosis 06/06/2017   Stenosis, spinal, thoracic 12/15/2014   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 09/22/2014   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/2011    PCP: [place] [facility]  REFERRING PROVIDER: Ashok Pall, MD  REFERRING DIAG: 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  PERTINENT HISTORY: Patient is a 41 y.o. male who presents to outpatient physical therapy with a referral for medical diagnosis  intervertebral  disc disorder of thoracic region with myelopathy. This patient's chief complaints consist of low back pain, R LE pain/paresthesia/weakness, L LE weakness s/p right L4-5 laminotomy, microdiscectomy on 10/06/2021 leading to the following functional deficits: difficulty with bed mobility, transfers, driving, household and  community ambulation, preventing falls, floor transfers, playing with his daughter, stairs, social participation and socializing with friends. 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 Patient denies hx of cancer, stroke, seizures, lung problems, heart problems, diabetes, unexplained weight loss, and osteoporosis. Patient reports he has difficulty with making it to the bathroom on time when he gets bowel or bladder urges and has stumbling. He has history of thoracic and lumbar surgeries.  PRECAUTIONS: falls. No post-op restrictions after 4 months from surgery.   PATIENT GOALS: get better and get off the walker to walk with [facility], play with his daughter (DOB 07/04/2020), get his strength back, be able to get up and down from floor.  SUBJECTIVE: Patient arrives with his rollator. He states his pain is a bit better since last week when he did not stay for his PT visit due to his pain being too high. Patient states he has been tolerating supine exercise okay but any walking or weight bearing increases his pain and weakness the more he weight bears. He has been having some trouble on the left LE occasionally as well. He had his MRI on Thursday and has a follow up with Dr. Christella Noa tomorrow.    PAIN:  Are you having pain? 3/10 Location: low back and right groin, hip and down back of R leg to knee.  OBJECTIVE     IMAGING Lumbar MRI report from 04/26/2022 EXAM: MRI LUMBAR SPINE WITHOUT AND WITH CONTRAST   TECHNIQUE: Multiplanar and multiecho pulse sequences of the lumbar spine were obtained without and with intravenous contrast.   CONTRAST:  41m  GADAVIST GADOBUTROL 1 MMOL/ML IV SOLN   COMPARISON:  MRI lumbar spine 09/18/2021   FINDINGS: Segmentation:  Standard.   Alignment:  Mild retrolisthesis of L5 on S1.   Vertebrae: Postsurgical changes from L4 right hemilaminectomy (series 9, image 27).   Conus medullaris and cauda equina: Conus extends to the L2 level. Conus and cauda equina appear normal.   Paraspinal and other soft tissues: There is linear contrast enhancing soft tissue in the posterior paraspinal soft tissues at the L3-L5 vertebral body levels (series 5, image 9). Contrast enhancing tissues extends anteriorly to involve the bilateral facet joints at this level (series 11, image 26) and the bilateral neural foramina.   Disc levels:   T11-T12: Only imaged in the sagittal plane. No disc bulge, spinal canal stenosis, or neural foraminal stenosis.   T12-L1: No disc bulge. No significant spinal canal or neural foraminal stenosis. Mild bilateral facet degenerative change.   L1-L2: No disc bulge. No significant spinal canal or neural foraminal stenosis. Mild bilateral facet degenerative change.   L2-L3: No disc bulge. No significant spinal canal or neural foraminal stenosis. Mild bilateral facet degenerative change.   L3-L4: Moderate bilateral facet degenerative change. Ligamentum flavum hypertrophy. Minimal disc bulge. Mild spinal canal stenosis. Mild bilateral neural foraminal narrowing.   L4-L5: Postsurgical changes from right hemilaminectomy and L4-L5 microdiscectomy. On the  right lateral aspect of the spinal canal at this level there is a 0.8 x 0.6 cm rim enhancing fluid collection (series 8, image 26/series 11, image 26). There is severe right neural foraminal stenosis and right lateral recess narrowing at this level (series 8, image 26). Severe left neural foraminal stenosis. There is also moderate to severe spinal canal stenosis. There is fluid within the bilateral facet joints   L5-S1: There are  moderate bilateral facet degenerative changes. Circumferential disc bulge. Persistent moderate to severe spinal canal stenosis. Severe bilateral neural foraminal stenosis, unchanged from prior.   IMPRESSION: 1. Postsurgical changes from interval right L4 hemilaminectomy and L4-L5 microdiscectomy. Compared to the prior preoperative exam there is a 0.8 x 0 6 cm rim enhancing intraspinal fluid collection along the right lateral aspect of the spinal canal at the L4 vertebral body level. Although this may be a post surgical inflammatory process, given rim enhancement and surrounding inflammatory change, correlation with inflammatory markers and for leukocytosis is recommended to exclude the possibility of infection. 2. There is persistent severe spinal canal stenosis at the L4-L5 level secondary to the presence of the fluid collection mentioned in impression #1. Moderate to severe spinal canal stenosis is also present at L5-S1, unchanged from prior. 3. Severe bilateral neural foraminal stenosis is present at L4-L5 and L5-S1, not significantly changed compared to preoperative exam.     Electronically Signed   By: Marin Roberts M.D.   On: 04/27/2022 15:48  TODAY'S TREATMENT  Therapeutic exercise: to centralize symptoms and improve ROM, strength, muscular endurance, and activity tolerance required for successful completion of functional activities.  - NuStep level 1 using bilateral upper and lower extremities. Seat/handle setting 11/11. For improved extremity mobility, muscular endurance, and activity tolerance; and to induce the analgesic effect of aerobic exercise, stimulate improved joint nutrition, and prepare body structures and systems for following interventions. 5. Average SPM = 69. (Painful but not worsening). - hooklying low trunk rotation, 1x2 min  - hooklying marching, 3x10 each side.  - hooklying isometric hip adduction against small pink theraball, 4 second holds, 2 min -  hooklying SAQ, 3x10 each side, L 10# AW, R 5# AW.  - hooklying isometric hamstring curl into pink theraball, AAROM. 2x10 each side, 4 second holds.   Pt required multimodal cuing for proper technique and to facilitate improved neuromuscular control, strength, range of motion, and functional ability resulting in improved performance and form.  HOME EXERCISE PROGRAM:  Access Code: [email or web address] URL: [email or web address] Date: 11/13/2021 Prepared by: Rosita Kea  Exercises - Sit to Stand  - 1 x weekly - 2-3 sets - 5-10 reps - Seated Isometric Hip Adduction with Ball  - 1 x daily - 2-3 sets - 5-10 reps - 5 seconds hold - Seated Toe Raise  - 1 x daily - 2-3 sets - 10 reps - Seated Single Arm Shoulder Row with Anchored Resistance  - 1 x daily - 3 sets - 10 reps  PATIENT EDUCATION:  Education details: Form/technique with exercise. Rational for interventions.  Person educated: Patient Education method: Explanation, Demonstration, Tactile cues, and Verbal cues Education comprehension: verbalized understanding, returned demonstration, verbal cues required, tactile cues required, and needs further education   ASSESSMENT:  CLINICAL IMPRESSION: Patient arrives with poor response to last PT session and continued worsening of weight bearing and activity tolerance. Interventions modified to those that appeared sufficiently tolerated, mostly in hooklying. Patient reported pain had spread further down R LE and into L LE by end of session  but was still rated 3/10. Patient to see surgeon tomorrow and plan to proceed with PT depending on MD assessment and plan. Patient would benefit from continued management of limiting condition by skilled physical therapist to address remaining impairments and functional limitations to work towards stated goals and return to PLOF or maximal functional independence.   OBJECTIVE IMPAIRMENTS Abnormal gait, decreased activity tolerance, decreased balance, decreased  coordination, decreased endurance, decreased knowledge of use of DME, decreased mobility, difficulty walking, decreased ROM, decreased strength, impaired perceived functional ability, impaired flexibility, impaired sensation, impaired tone, impaired UE functional use, postural dysfunction, obesity, and pain.   ACTIVITY LIMITATIONS cleaning, community activity, driving, occupation, laundry, shopping, and basic mobility such as bed mobility, transfers, driving, household and community ambulation, preventing falls, floor transfers, playing with his daughter, stairs, social participation and socializing with friends .   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    REHAB POTENTIAL: Good  CLINICAL DECISION MAKING: Evolving/moderate complexity  EVALUATION COMPLEXITY: Moderate   GOALS: Goals reviewed with patient? No  SHORT TERM GOALS: Target date: 11/15/2021  Be independent with initial home exercise program for self-management of symptoms. Baseline: initial HEP to be provided at visit 2 as appropriate (11/01/2021);  Goal status: MET  LONG TERM GOALS: Target date: 01/24/2022; Updated to 04/09/2022 on 01/15/2022. UPDATED to 06/25/2022 for all unmet goals on 04/02/2022  Be independent with a long-term home exercise program for self-management of symptoms.  Baseline: Initial HEP to be provided at visit 2 as appropriate (11/01/2021); patient currently participating well (12/06/2021; 01/15/2022; 02/26/2022);  participating as able (04/02/2022);  Goal status: IN PROGRESS  2.  Demonstrate improved FOTO to equal or greater than 42 by visit #14 to demonstrate improvement in overall condition and self-reported functional ability.  Baseline: 26 (11/01/2021); 45 at visit #10 (12/06/2021); 53 at visit# 20 (01/15/2022); 50 at visit #30 (02/26/2022); 53 at visit #40 (04/02/2022);  Goal status: ACHIEVED 12/06/2021  3.  Patient will complete 5 Time Sit To Stand Test in equal or less than 15 seconds for 18.5 inch surface with no UE support to demonstrate improved ability to complete transfers and decrease fall risk.  Baseline: 45 second from 18.5 inch plinth with R UE support on plinth and L UE support on locked rollator and B UE support on rollator in standing. Multiple attempts needed on some reps (11/01/2021); 21 second from 18.5 inch plinth with B UE support on plinth. Walker positioned in front of him for safety but did not need it. (12/06/2021); 21 second from 18.5 inch plinth with no UE support. Walker positioned in front of him for safety but did not need it (01/15/2022); 15 seconds from 18.5 inch plinth with no UE support. Walker positioned in front of him for safety but did not need it (02/26/2022); 23 second from 18.5 inch plinth with R UE support on plinth. Walker positioned in front of him for safety. Unable to perform without UE support. Reports his R LE feels weak when he puts weight/pressure on it and it feels like a pinched nerve in his groin and hip (04/02/2022);  Goal status: IN  PROGRESS  4.  Patient will ambulate equal or greater than 1200 feet on 6 Minute Walk Test with [facility]  to demonstrate improved household and community mobility as well as community and social participation.  Baseline: to be tested visit 2 as appropriate (11/01/2021); 800 feet with rollator. Low back pain increased to 4-5/10 and had pain down R LE to calf (11/13/2021); 943 feet with SPC and SBA (two little standing breaks to stretch back due to  tightness at low back, increased sharper pain in back and B legs, 12/06/2021);  948 feet with SPC (01/09/2022); 1045 feet with SPC no rests  but catches toes on floor at times (01/15/2022); 936 feet with SBA and mostly carrying cane for first 4 min of test. Scuffing feet on floor slightly. No stumbles. Sharp pain developed around 4 min in right glute/hip region worse on initial contact that remained after seated rest from R glute to anterior thigh to knee and in toes (02/26/2022); 773 feet with SBA with rollator. R LE gave way once during test and patient felt he could barely make it to the end. He recovered balance with use of B UE support on rollator. Reports sudden feeling that right LE will give way (04/02/2022);  Goal status: IN PROGRESS  5.  Complete community, work and/or recreational activities without limitation due to current condition.  Baseline: difficulty with bed mobility, transfers, driving, household and community ambulation, preventing falls, floor transfers, playing with his daughter, stairs, social participation and socializing with friends (11/01/2021); walking is getting easier, transfers are easier, doesn't have to use his rollator as much, stairs are still troublesome, has not tried floor transfer, still restricted socially and continues to have significant limitations with functional activities and mobility (12/06/2021); walking and transfers are easier and is using [place] at home instead of rollator, continues to be restricted socially and have significant limitations with functional activities, basic mobility, and balance (01/15/2022); driving is better and he is able to move his foot instead of his whole leg, still has not been able to get to the floor and back up, it is getting a little easier to do things around the house, still hard to lift heavy boxes and lift his daughter, he can stand a little longer to do the dishes, vacuuming the floor is less difficult than before, he continues to have  limitations in socialization and community mobility, still is concerned about falling, continues to have urgency with bowel and bladder (02/26/2022); everything has gotten a little harder around the house over the last couple of weeks due to right LE weakness, and groin, hip, low back pain (04/02/2022);  Goal status: IN PROGRESS   PLAN: PT FREQUENCY: 2x/week  PT DURATION: 12 weeks  PLANNED INTERVENTIONS: Therapeutic exercises, Therapeutic activity, Neuromuscular re-education, Balance training, Gait training, Patient/Family education, Joint mobilization, Stair training, DME instructions, Dry Needling, and Electrical stimulation.  PLAN FOR NEXT SESSION: Update HEP as tolerated. Progressive LE, core, functional strengthening and balance exercises as tolerated and appropriate.    Everlean Alstrom. Graylon Good, PT, DPT 04/30/22, 8:00 PM  [facility] [address] [place], [place] [address] P: [phone] I F: [phone]

## 2022-05-02 ENCOUNTER — Encounter: Payer: Self-pay | Admitting: Physical Therapy

## 2022-05-02 ENCOUNTER — Ambulatory Visit: Payer: Medicare Other | Admitting: Physical Therapy

## 2022-05-02 DIAGNOSIS — M5459 Other low back pain: Secondary | ICD-10-CM | POA: Diagnosis not present

## 2022-05-02 DIAGNOSIS — R296 Repeated falls: Secondary | ICD-10-CM

## 2022-05-02 DIAGNOSIS — R262 Difficulty in walking, not elsewhere classified: Secondary | ICD-10-CM

## 2022-05-02 DIAGNOSIS — M546 Pain in thoracic spine: Secondary | ICD-10-CM

## 2022-05-02 DIAGNOSIS — R2689 Other abnormalities of gait and mobility: Secondary | ICD-10-CM

## 2022-05-02 DIAGNOSIS — M6281 Muscle weakness (generalized): Secondary | ICD-10-CM

## 2022-05-02 NOTE — Therapy (Signed)
OUTPATIENT PHYSICAL THERAPY TREATMENT NOTE   Patient Name: Travis Fitzpatrick MRN: [id number] DOB:09-18-1980, 41 y.o., male Today's Date: 05/02/22     PT End of Session - 05/02/22 1831     Visit Number 19    Number of Visits 36    Date for PT Re-Evaluation 06/25/22    Authorization Type MEDICARE PART B reporting period from 04/02/2022    Progress Note Due on Visit 40    PT Start Time 1825    PT Stop Time 1903    PT Time Calculation (min) 38 min    Activity Tolerance Patient limited by pain;Patient tolerated treatment well    Behavior During Therapy [facility] for tasks assessed/performed               Past Medical History:  Diagnosis Date   Bowel trouble    urgency   Medical history non-contributory    Urinary urgency    Past Surgical History:  Procedure Laterality Date   BACK SURGERY  2010   CIRCUMCISION     LUMBAR LAMINECTOMY/DECOMPRESSION MICRODISCECTOMY  07/20/2011   Procedure: LUMBAR LAMINECTOMY/DECOMPRESSION MICRODISCECTOMY;  Surgeon: Winfield Cunas;  Location: [place] [facility];  Service: Neurosurgery;  Laterality: N/A;  right thoracotomy with thoracic eight-nine discectomy and fusion   LUMBAR LAMINECTOMY/DECOMPRESSION MICRODISCECTOMY Right 10/06/2021   Procedure: Right Lumbar Four-Five Microdiscectomy, Right Lumbar Five-Sacal One Foraminotomy;  Surgeon: Ashok Pall, MD;  Location: [place];  Service: Neurosurgery;  Laterality: Right;  3C/RM 21   THORACIC DISCECTOMY  07/16/2012   Procedure: THORACIC DISCECTOMY;  Surgeon: Winfield Cunas, MD;  Location: [place] [facility];  Service: Neurosurgery;  Laterality: Right;  RIGHT Thoracic seven-eight  thoracic diskectomy via thoracotomy by dr Cyndia Bent   THORACIC DISCECTOMY N/A 12/15/2014   Procedure: THORACIC SEVEN TO THORACIC NINE Laminectomy ;  Surgeon: Ashok Pall, MD;  Location: [place] [facility];  Service: Neurosurgery;  Laterality: N/A;   THORACIC DISCECTOMY N/A 06/06/2017   Procedure: LAMINECTOMY THORACIC NINE-TEN;  Surgeon: Ashok Pall, MD;   Location: [place];  Service: Neurosurgery;  Laterality: N/A;  LAMINECTOMY THORACIC NINE-TEN   THORACOTOMY  07/20/2011   Procedure: THORACOTOMY OPEN FOR SPINE SURGERY;  Surgeon: Pierre Bali, MD;  Location: [facility];  Service: Vascular;  Laterality: N/A;   THORACOTOMY  07/16/2012   Procedure: THORACOTOMY OPEN FOR SPINE SURGERY;  Surgeon: Gaye Pollack, MD;  Location: [place] [facility];  Service: Thoracic;  Laterality: N/A;   Patient Active Problem List   Diagnosis Date Noted   HNP (herniated nucleus pulposus), lumbar 10/06/2021   Hand pain 06/28/2021   Lumbar facet arthropathy 01/11/2021   Thoracic spondylosis with myelopathy 01/11/2021   Thoracic spinal stenosis 06/06/2017   Stenosis, spinal, thoracic 12/15/2014   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 09/22/2014   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/2011    PCP: [place] [facility]  REFERRING PROVIDER: Ashok Pall, MD  REFERRING DIAG: 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  PERTINENT HISTORY: Patient is a 41 y.o. 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 This patient's chief complaints consist of low back pain, R LE pain/paresthesia/weakness, L LE weakness s/p right L4-5 laminotomy, microdiscectomy on 10/06/2021 leading to the following functional deficits: difficulty with bed mobility, transfers, driving, household and  community ambulation, preventing falls, floor transfers, playing with his daughter, stairs, social participation and socializing with friends. 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 Patient denies hx of cancer, stroke, seizures, lung problems, heart problems, diabetes, unexplained weight loss, and osteoporosis. Patient reports he has difficulty with making it to the bathroom on time when he gets bowel or bladder urges and has stumbling. He has history of thoracic and lumbar surgeries.  PRECAUTIONS: falls. No post-op restrictions after 4 months from surgery.   PATIENT GOALS: get better and get off the walker to walk with [facility], play with his daughter (DOB 07/04/2020), get his strength back, be able to get up and down from floor.  SUBJECTIVE: Patient arrives with his rollator. He states he felt no worse after last PT session and his pain is 3/10 today. He saw Dr. Christella Noa who plans to perform a lumbar fusion at L4-L5. Patient expects this in the next two weeks but has not yet received the call to schedule. Dr. Christella Noa recommended patient continue PT as tolerated if he was able to benefit from it prior to surgery. Patient does not recall Dr. Christella Noa saying anything about PT after surgery.    PAIN:  Are you having pain? 3/10 Location: low back and right groin, hip and down back of R leg to knee.  OBJECTIVE      TODAY'S TREATMENT  Therapeutic exercise: to centralize symptoms and improve ROM, strength, muscular endurance, and activity tolerance required for successful completion of functional activities.  - NuStep level 1 using bilateral upper and  lower extremities. Seat/handle setting 11/11. For improved extremity mobility, muscular endurance, and activity tolerance; and to induce the analgesic effect of aerobic exercise, stimulate improved joint nutrition, and prepare body structures and systems for following interventions. Average SPM = 69. (Painful but not worsening). - hooklying low trunk rotation, 1x2 min  - hooklying marching, 3x10 each side.  - hooklying isometric hip adduction against small pink theraball, 4 second holds, 2 min - hooklying SAQ, 3x10 each side, L 10# AW, R 5# AW.  - hooklying isometric hamstring curl into pink theraball, AAROM. 2x10 each side, 4 second holds.   Pt required multimodal cuing for proper technique and to facilitate improved neuromuscular control, strength, range of motion, and functional ability resulting in improved performance and form.  HOME EXERCISE PROGRAM:  Access Code: [email or web address] URL: [email or web address] Date: 11/13/2021 Prepared by: Rosita Kea  Exercises - Sit to Stand  - 1 x weekly - 2-3 sets - 5-10 reps - Seated Isometric Hip Adduction with Ball  - 1 x daily - 2-3 sets - 5-10 reps - 5 seconds hold - Seated Toe Raise  - 1 x daily - 2-3 sets - 10 reps - Seated Single Arm Shoulder Row with Anchored Resistance  - 1 x daily - 3 sets - 10 reps  PATIENT EDUCATION:  Education details: Form/technique with exercise. Rational for interventions.  Person educated: Patient Education method:  Explanation, Demonstration, Tactile cues, and Verbal cues Education comprehension: verbalized understanding, returned demonstration, verbal cues required, tactile cues required, and needs further education   ASSESSMENT:  CLINICAL IMPRESSION: Patient arrives with acceptable response to last PT session, so repeated similar exercises with continued good tolerance. Patient has been recommended for lumbar fusion by surgeon which has not been scheduled yet. Patient unsure if he wants to continue PT  at this time, but is concerned he will lose function and strength that he has worked so hard to build if he does not attend. PT recommends patient continue with strengthening as able to prevent further loss of function and independence. Plan to re-assess at visit 20 progress note next session after hopefully learning when surgery is scheduled. Patient is at risk for loss of function and independence without continued PT. Patient would benefit from continued management of limiting condition by skilled physical therapist to address remaining impairments and functional limitations to work towards stated goals and return to PLOF or maximal functional independence.   OBJECTIVE IMPAIRMENTS Abnormal gait, decreased activity tolerance, decreased balance, decreased coordination, decreased endurance, decreased knowledge of use of DME, decreased mobility, difficulty walking, decreased ROM, decreased strength, impaired perceived functional ability, impaired flexibility, impaired sensation, impaired tone, impaired UE functional use, postural dysfunction, obesity, and pain.   ACTIVITY LIMITATIONS cleaning, community activity, driving, occupation, laundry, shopping, and basic mobility such as bed mobility, transfers, driving, household and community ambulation, preventing falls, floor transfers, playing with his daughter, stairs, social participation and socializing with friends .   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     REHAB POTENTIAL: Good  CLINICAL DECISION MAKING: Evolving/moderate complexity  EVALUATION COMPLEXITY: Moderate   GOALS: Goals reviewed with patient? No  SHORT TERM GOALS: Target date: 11/15/2021  Be independent with initial home exercise program for self-management of symptoms. Baseline: initial HEP to be provided at visit 2 as appropriate (11/01/2021);  Goal status: MET  LONG TERM GOALS: Target date: 01/24/2022; Updated to 04/09/2022 on 01/15/2022. UPDATED to 06/25/2022 for all unmet goals on 04/02/2022  Be independent with a long-term home exercise program for self-management of symptoms.  Baseline: Initial HEP to be provided at visit 2 as appropriate (11/01/2021); patient currently participating well (12/06/2021; 01/15/2022; 02/26/2022); participating as able (04/02/2022);  Goal status: IN PROGRESS  2.  Demonstrate improved FOTO to equal or greater than 42 by visit #14 to demonstrate improvement in overall condition and self-reported functional ability.  Baseline: 26 (11/01/2021); 45 at visit #10 (12/06/2021); 53 at visit# 20 (01/15/2022); 50 at visit #30 (02/26/2022); 53 at visit #40 (04/02/2022);  Goal status: ACHIEVED 12/06/2021  3.  Patient will complete 5 Time Sit To Stand Test in equal or less than 15 seconds for 18.5 inch surface with no UE support to demonstrate improved ability to complete transfers and decrease fall risk.  Baseline: 45 second from 18.5 inch plinth with R UE support on plinth and L UE support on locked rollator and B UE support on rollator in standing. Multiple attempts needed on some reps (11/01/2021); 21 second from 18.5 inch plinth with B UE support on  plinth. Walker positioned in front of him for safety but did not need it. (12/06/2021); 21 second from 18.5 inch plinth with no UE support. Walker positioned in front of him for safety but did not need it (01/15/2022); 15 seconds from 18.5 inch plinth with no UE support. Walker positioned in front of him for safety but did not  need it (02/26/2022); 23 second from 18.5 inch plinth with R UE support on plinth. Walker positioned in front of him for safety. Unable to perform without UE support. Reports his R LE feels weak when he puts weight/pressure on it and it feels like a pinched nerve in his groin and hip (04/02/2022);  Goal status: IN PROGRESS  4.  Patient will ambulate equal or greater than 1200 feet on 6 Minute Walk Test with [facility]  to demonstrate improved household and community mobility as well as community and social participation.  Baseline: to be tested visit 2 as appropriate (11/01/2021); 800 feet with rollator. Low back pain increased to 4-5/10 and had pain down R LE to calf (11/13/2021); 943 feet with SPC and SBA (two little standing breaks to stretch back due to tightness at low back, increased sharper pain in back and B legs, 12/06/2021);  948 feet with SPC (01/09/2022); 1045 feet with SPC no rests  but catches toes on floor at times (01/15/2022); 936 feet with SBA and mostly carrying cane for first 4 min of test. Scuffing feet on floor slightly. No stumbles. Sharp pain developed around 4 min in right glute/hip region worse on initial contact that remained after seated rest from R glute to anterior thigh to knee and in toes (02/26/2022); 773 feet with SBA with rollator. R LE gave way once during test and patient felt he could barely make it to the end. He recovered balance with use of B UE support on rollator. Reports sudden feeling that right LE will give way (04/02/2022);  Goal status: IN PROGRESS  5.  Complete community, work and/or recreational activities without limitation due to current condition.  Baseline: difficulty with bed mobility, transfers, driving, household and community ambulation, preventing falls, floor transfers, playing with his daughter, stairs, social participation and socializing with friends (11/01/2021); walking is getting easier, transfers are easier, doesn't have to use his rollator as much, stairs are  still troublesome, has not tried floor transfer, still restricted socially and continues to have significant limitations with functional activities and mobility (12/06/2021); walking and transfers are easier and is using [place] at home instead of rollator, continues to be restricted socially and have significant limitations with functional activities, basic mobility, and balance (01/15/2022); driving is better and he is able to move his foot instead of his whole leg, still has not been able to get to the floor and back up, it is getting a little easier to do things around the house, still hard to lift heavy boxes and lift his daughter, he can stand a little longer to do the dishes, vacuuming the floor is less difficult than before, he continues to have limitations in socialization and community mobility, still is concerned about falling, continues to have urgency with bowel and bladder (02/26/2022); everything has gotten a little harder around the house over the last couple of weeks due to right LE weakness, and groin, hip, low back pain (04/02/2022);  Goal status: IN PROGRESS   PLAN: PT FREQUENCY: 2x/week  PT DURATION: 12 weeks  PLANNED INTERVENTIONS: Therapeutic exercises, Therapeutic activity, Neuromuscular re-education, Balance training, Gait training, Patient/Family education, Joint  mobilization, Stair training, DME instructions, Dry Needling, and Electrical stimulation.  PLAN FOR NEXT SESSION: Update HEP as tolerated. Progressive LE, core, functional strengthening and balance exercises as tolerated and appropriate.    Everlean Alstrom. Graylon Good, PT, DPT 05/02/22, 9:16 PM  [facility] [address] [place], [place] [address] P: [phone] I F: [phone]

## 2022-05-07 ENCOUNTER — Ambulatory Visit: Payer: Medicare Other | Admitting: Physical Therapy

## 2022-05-07 ENCOUNTER — Telehealth: Payer: Self-pay | Admitting: Physical Therapy

## 2022-05-07 NOTE — Telephone Encounter (Signed)
Called patient to find out if he knows when his surgery will be yet. He has not heard from the person scheduling surgery yet and said he will call us when he does. He states his mother in law is in the hospital this weekend, so he was caring for his daughter all weekend which made his R LE hurt too much to come to PT tonight so he canceled. He confirmed his plans to come to his next scheduled appointment Wed night.   Everlean Alstrom. Graylon Good, PT, DPT 05/07/22, 1:23 PM  [facility] [address] [place], [place] [address] P: [phone] I F: [phone]

## 2022-05-09 ENCOUNTER — Encounter: Payer: Self-pay | Admitting: Physical Therapy

## 2022-05-09 ENCOUNTER — Ambulatory Visit: Payer: Medicare Other | Admitting: Physical Therapy

## 2022-05-09 DIAGNOSIS — M5459 Other low back pain: Secondary | ICD-10-CM

## 2022-05-09 DIAGNOSIS — R262 Difficulty in walking, not elsewhere classified: Secondary | ICD-10-CM

## 2022-05-09 DIAGNOSIS — M6281 Muscle weakness (generalized): Secondary | ICD-10-CM

## 2022-05-09 DIAGNOSIS — R2689 Other abnormalities of gait and mobility: Secondary | ICD-10-CM

## 2022-05-09 DIAGNOSIS — M546 Pain in thoracic spine: Secondary | ICD-10-CM

## 2022-05-09 DIAGNOSIS — R296 Repeated falls: Secondary | ICD-10-CM

## 2022-05-09 NOTE — Therapy (Signed)
OUTPATIENT PHYSICAL THERAPY TREATMENT NOTE    Patient Name: Travis Fitzpatrick MRN: [id number] DOB:1980/12/22, 41 y.o., male Today's Date: 05/09/22     PT End of Session - 05/09/22 1832     Visit Number 46    Number of Visits 42    Date for PT Re-Evaluation 06/25/22    Authorization Type MEDICARE PART B reporting period from 04/02/2022    Progress Note Due on Visit 50    PT Start Time 1820    PT Stop Time 1900    PT Time Calculation (min) 40 min    Activity Tolerance Patient limited by pain;Patient tolerated treatment well    Behavior During Therapy [facility] for tasks assessed/performed                Past Medical History:  Diagnosis Date   Bowel trouble    urgency   Medical history non-contributory    Urinary urgency    Past Surgical History:  Procedure Laterality Date   BACK SURGERY  2010   CIRCUMCISION     LUMBAR LAMINECTOMY/DECOMPRESSION MICRODISCECTOMY  07/20/2011   Procedure: LUMBAR LAMINECTOMY/DECOMPRESSION MICRODISCECTOMY;  Surgeon: Winfield Cunas;  Location: [place] [facility];  Service: Neurosurgery;  Laterality: N/A;  right thoracotomy with thoracic eight-nine discectomy and fusion   LUMBAR LAMINECTOMY/DECOMPRESSION MICRODISCECTOMY Right 10/06/2021   Procedure: Right Lumbar Four-Five Microdiscectomy, Right Lumbar Five-Sacal One Foraminotomy;  Surgeon: Ashok Pall, MD;  Location: [place];  Service: Neurosurgery;  Laterality: Right;  3C/RM 21   THORACIC DISCECTOMY  07/16/2012   Procedure: THORACIC DISCECTOMY;  Surgeon: Winfield Cunas, MD;  Location: [place] [facility];  Service: Neurosurgery;  Laterality: Right;  RIGHT Thoracic seven-eight  thoracic diskectomy via thoracotomy by dr Cyndia Bent   THORACIC DISCECTOMY N/A 12/15/2014   Procedure: THORACIC SEVEN TO THORACIC NINE Laminectomy ;  Surgeon: Ashok Pall, MD;  Location: [place] [facility];  Service: Neurosurgery;  Laterality: N/A;   THORACIC DISCECTOMY N/A 06/06/2017   Procedure: LAMINECTOMY THORACIC NINE-TEN;  Surgeon: Ashok Pall,  MD;  Location: [place];  Service: Neurosurgery;  Laterality: N/A;  LAMINECTOMY THORACIC NINE-TEN   THORACOTOMY  07/20/2011   Procedure: THORACOTOMY OPEN FOR SPINE SURGERY;  Surgeon: Pierre Bali, MD;  Location: [facility];  Service: Vascular;  Laterality: N/A;   THORACOTOMY  07/16/2012   Procedure: THORACOTOMY OPEN FOR SPINE SURGERY;  Surgeon: Gaye Pollack, MD;  Location: [place] [facility];  Service: Thoracic;  Laterality: N/A;   Patient Active Problem List   Diagnosis Date Noted   HNP (herniated nucleus pulposus), lumbar 10/06/2021   Hand pain 06/28/2021   Lumbar facet arthropathy 01/11/2021   Thoracic spondylosis with myelopathy 01/11/2021   Thoracic spinal stenosis 06/06/2017   Stenosis, spinal, thoracic 12/15/2014   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 09/22/2014   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/2011    PCP: [place] [facility]  REFERRING PROVIDER: Ashok Pall, MD  REFERRING DIAG: 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  PERTINENT HISTORY: Patient is a 41 y.o. male who presents to outpatient physical therapy with a referral for medical  diagnosis  intervertebral disc disorder of thoracic region with myelopathy. This patient's chief complaints consist of low back pain, R LE pain/paresthesia/weakness, L LE weakness s/p right L4-5 laminotomy, microdiscectomy on 10/06/2021 leading to the following functional deficits: difficulty with bed mobility, transfers, driving, household and  community ambulation, preventing falls, floor transfers, playing with his daughter, stairs, social participation and socializing with friends. 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 Patient denies hx of cancer, stroke, seizures, lung problems, heart problems, diabetes, unexplained weight loss, and osteoporosis. Patient reports he has difficulty with making it to the bathroom on time when he gets bowel or bladder urges and has stumbling. He has history of thoracic and lumbar surgeries.  PRECAUTIONS: falls. No post-op restrictions after 4 months from surgery.   PATIENT GOALS: get better and get off the walker to walk with [facility], play with his daughter (DOB 07/04/2020), get his strength back, be able to get up and down from floor.  SUBJECTIVE: Patient arrives with his rollator. He missed his last PT session because his R leg pain and back pain was too bad and he was having a hard time walking. He was watching his daughter all weekend due to a family emergency and he was a lot more active taking care of her which increased his pain. He arrives today with 3/10 in the low back and right leg and groin. Bowel and bladder symptoms are getting worse and about like it was prior to surgery. His surgery has not yet been scheduled.    PAIN:  Are you having pain? 3/10 Location: low back and right groin, hip and down back of R leg to knee.  OBJECTIVE  FUNCTIONAL/BALANCE TESTS 6 Minute Walk Test: 84 feet with rollator (stopped by low back and R LE pain), pain up to 4/10.      TODAY'S TREATMENT  Therapeutic exercise: to centralize symptoms and improve ROM,  strength, muscular endurance, and activity tolerance required for successful completion of functional activities.  - ambulation for max distance in 6 minutes with rollator, 84 feet before needing to stop due to increasing pain in low back and R LE.  - NuStep level 1-4 using bilateral upper and left lower extremity only. Seat/handle setting 11/11. For improved extremity mobility, muscular endurance, and activity tolerance; and to induce the analgesic effect of aerobic exercise, stimulate improved joint nutrition, and prepare body structures and systems for following interventions. Average SPM = 63. (Painful but not worsening). - hooklying low trunk rotation, 1x2 min  - hooklying marching, 3x10 each side.  - hooklying isometric hip adduction against small pink theraball, 4 second holds, 2 min - hooklying SAQ (knees supported by black bolsters from back treatment room cart), 3x10 R side with 5# AW, 3x15 L side with 20# AW.   Circuit (hooklying with R knee supported by black bolster from back treatment room cart):  - L hip abduction/adduction against manual resistance with knee extended and PT supporting leg at ankle just lifted from at to reduce friction, 3x10.  - L hip move towards extension from from approx 60 degrees flexion to 30 degrees flexion against manual resistance with PT supporting at ankle and back of knee, 3x10 (pain free ROM).   -  hooklying isometric hamstring curl into pink theraball, AAROM. 1x10 each side, 4 second holds. Contralateral knee supported by black bolster.   Pt required multimodal cuing for proper technique and to facilitate improved neuromuscular control, strength, range of motion, and functional ability resulting in improved performance and form.  HOME EXERCISE PROGRAM:  Access Code: [email or web address] URL: [email or web address] Date: 11/13/2021 Prepared by: Rosita Kea  Exercises - Sit to Stand  - 1 x weekly - 2-3 sets - 5-10 reps - Seated Isometric Hip  Adduction with Ball  - 1 x daily - 2-3 sets - 5-10 reps - 5 seconds hold - Seated Toe Raise  - 1 x daily - 2-3 sets - 10 reps - Seated Single Arm Shoulder Row with Anchored Resistance  - 1 x daily - 3 sets - 10 reps  PATIENT EDUCATION:  Education details: Form/technique with exercise. Rational for interventions.  Person educated: Patient Education method: Explanation, Demonstration, Tactile cues, and Verbal cues Education comprehension: verbalized understanding, returned demonstration, verbal cues required, tactile cues required, and needs further education   ASSESSMENT:  CLINICAL IMPRESSION: Patient  arrives with similar pain and difficulty to last PT session. He attempted walking for 6MWT but was unable to go more than 84 feet before stopping due to increased R LE and back pain. He continued with Nustep modified to exclude R LE and mat exercises as tolerated. He continues to have difficulty with use of R LE. He benefits from PT where he has more access to positioning, equipment, and physical assistance from PT to allow him to work L LE more effectively than  he can at home to help preserve function and control pain while awaiting surgery. Continued PT prior to surgery with help shorten recovery and maximize functional outcomes following surgery. Plan to continue with strengthening as tolerated next session. Patient would benefit from continued management of limiting condition by skilled physical therapist to address remaining impairments and functional limitations to work towards stated goals and return to PLOF or maximal functional independence.    OBJECTIVE IMPAIRMENTS Abnormal gait, decreased activity tolerance, decreased balance, decreased coordination, decreased endurance, decreased knowledge of use of DME, decreased mobility, difficulty walking, decreased ROM, decreased strength, impaired perceived functional ability, impaired flexibility, impaired sensation, impaired tone, impaired UE  functional use, postural dysfunction, obesity, and pain.   ACTIVITY LIMITATIONS cleaning, community activity, driving, occupation, laundry, shopping, and basic mobility such as bed mobility, transfers, driving, household and community ambulation, preventing falls, floor transfers, playing with his daughter, stairs, social participation and socializing with friends .   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    REHAB POTENTIAL: Good  CLINICAL DECISION MAKING: Evolving/moderate complexity  EVALUATION COMPLEXITY: Moderate   GOALS: Goals reviewed with patient? No  SHORT TERM GOALS: Target date: 11/15/2021  Be independent with initial home exercise program for self-management of symptoms. Baseline: initial HEP to be provided at visit 2 as appropriate (11/01/2021);  Goal status: MET  LONG TERM GOALS: Target date: 01/24/2022; Updated to 04/09/2022 on 01/15/2022. UPDATED to 06/25/2022 for all unmet goals on 04/02/2022  Be independent with a long-term home exercise program  for self-management of symptoms.  Baseline: Initial HEP to be provided at visit 2 as appropriate (11/01/2021); patient currently participating well (12/06/2021; 01/15/2022; 02/26/2022); participating as able (04/02/2022);  Goal status: IN PROGRESS  2.  Demonstrate improved FOTO to equal or greater than 42 by visit #14 to demonstrate improvement in overall condition and self-reported functional ability.  Baseline: 26 (11/01/2021); 45 at  visit #10 (12/06/2021); 53 at visit# 20 (01/15/2022); 50 at visit #30 (02/26/2022); 53 at visit #40 (04/02/2022);  Goal status: ACHIEVED 12/06/2021  3.  Patient will complete 5 Time Sit To Stand Test in equal or less than 15 seconds for 18.5 inch surface with no UE support to demonstrate improved ability to complete transfers and decrease fall risk.  Baseline: 45 second from 18.5 inch plinth with R UE support on plinth and L UE support on locked rollator and B UE support on rollator in standing. Multiple attempts needed on some reps (11/01/2021); 21 second from 18.5 inch plinth with B UE support on plinth. Walker positioned in front of him for safety but did not need it. (12/06/2021); 21 second from 18.5 inch plinth with no UE support. Walker positioned in front of him for safety but did not need it (01/15/2022); 15 seconds from 18.5 inch plinth with no UE support. Walker positioned in front of him for safety but did not need it (02/26/2022); 23 second from 18.5 inch plinth with R UE support on plinth. Walker positioned in front of him for safety. Unable to perform without UE support. Reports his R LE feels weak when he puts weight/pressure on it and it feels like a pinched nerve in his groin and hip (04/02/2022);  Goal status: IN PROGRESS  4.  Patient will ambulate equal or greater than 1200 feet on 6 Minute Walk Test with [facility]  to demonstrate improved household and community mobility as well as community and social participation.  Baseline: to be tested visit 2 as appropriate (11/01/2021); 800 feet with rollator. Low back pain increased to 4-5/10 and had pain down R LE to calf (11/13/2021); 943 feet with SPC and SBA (two little standing breaks to stretch back due to tightness at low back, increased sharper pain in back and B legs, 12/06/2021);  948 feet with SPC (01/09/2022); 1045 feet with SPC no rests  but catches toes on floor at times (01/15/2022); 936 feet with SBA and mostly carrying cane for first 4 min of test.  Scuffing feet on floor slightly. No stumbles. Sharp pain developed around 4 min in right glute/hip region worse on initial contact that remained after seated rest from R glute to anterior thigh to knee and in toes (02/26/2022); 773 feet with SBA with rollator. R LE gave way once during test and patient felt he could barely make it to the end. He recovered balance with use of B UE support on rollator. Reports sudden feeling that right LE will give way (04/02/2022);  Goal status: IN PROGRESS  5.  Complete community, work and/or recreational activities without limitation due to current condition.  Baseline: difficulty with bed mobility, transfers, driving, household and community ambulation, preventing falls, floor transfers, playing with his daughter, stairs, social participation and socializing with friends (11/01/2021); walking is getting easier, transfers are easier, doesn't have to use his rollator as much, stairs are still troublesome, has not tried floor transfer, still restricted socially and continues to have significant limitations with functional activities and mobility (12/06/2021); walking and transfers are easier and is using [place] at home  instead of rollator, continues to be restricted socially and have significant limitations with functional activities, basic mobility, and balance (01/15/2022); driving is better and he is able to move his foot instead of his whole leg, still has not been able to get to the floor and back up, it is getting a little easier to do things around the house, still hard to lift heavy boxes and lift his daughter, he can stand a little longer to do the dishes, vacuuming the floor is less difficult than before, he continues to have limitations in socialization and community mobility, still is concerned about falling, continues to have urgency with bowel and bladder (02/26/2022); everything has gotten a little harder around the house over the last couple of weeks due to right LE  weakness, and groin, hip, low back pain (04/02/2022);  Goal status: IN PROGRESS   PLAN: PT FREQUENCY: 2x/week  PT DURATION: 12 weeks  PLANNED INTERVENTIONS: Therapeutic exercises, Therapeutic activity, Neuromuscular re-education, Balance training, Gait training, Patient/Family education, Joint mobilization, Stair training, DME instructions, Dry Needling, and Electrical stimulation.  PLAN FOR NEXT SESSION: Update HEP as tolerated. Progressive LE, core, functional strengthening and balance exercises as tolerated and appropriate.    Everlean Alstrom. Graylon Good, PT, DPT 05/09/22, 8:04 PM  [facility] [address] [place], [place] [address] P: [phone] I F: [phone]

## 2022-05-14 ENCOUNTER — Ambulatory Visit: Payer: Medicare Other | Admitting: Physical Therapy

## 2022-05-14 ENCOUNTER — Other Ambulatory Visit: Payer: Self-pay | Admitting: Neurosurgery

## 2022-05-15 ENCOUNTER — Other Ambulatory Visit: Payer: Self-pay | Admitting: Physical Medicine & Rehabilitation

## 2022-05-15 DIAGNOSIS — M4714 Other spondylosis with myelopathy, thoracic region: Secondary | ICD-10-CM

## 2022-05-15 DIAGNOSIS — M47816 Spondylosis without myelopathy or radiculopathy, lumbar region: Secondary | ICD-10-CM

## 2022-05-15 DIAGNOSIS — M4804 Spinal stenosis, thoracic region: Secondary | ICD-10-CM

## 2022-05-15 DIAGNOSIS — M5104 Intervertebral disc disorders with myelopathy, thoracic region: Secondary | ICD-10-CM

## 2022-05-16 ENCOUNTER — Encounter: Payer: Self-pay | Admitting: Physical Therapy

## 2022-05-16 ENCOUNTER — Ambulatory Visit: Payer: Medicare Other | Attending: Neurosurgery

## 2022-05-16 DIAGNOSIS — M5441 Lumbago with sciatica, right side: Secondary | ICD-10-CM | POA: Insufficient documentation

## 2022-05-16 DIAGNOSIS — R2689 Other abnormalities of gait and mobility: Secondary | ICD-10-CM | POA: Insufficient documentation

## 2022-05-16 DIAGNOSIS — G8929 Other chronic pain: Secondary | ICD-10-CM | POA: Insufficient documentation

## 2022-05-16 DIAGNOSIS — M5442 Lumbago with sciatica, left side: Secondary | ICD-10-CM | POA: Insufficient documentation

## 2022-05-16 DIAGNOSIS — M6281 Muscle weakness (generalized): Secondary | ICD-10-CM | POA: Diagnosis present

## 2022-05-16 DIAGNOSIS — M546 Pain in thoracic spine: Secondary | ICD-10-CM | POA: Insufficient documentation

## 2022-05-16 DIAGNOSIS — R262 Difficulty in walking, not elsewhere classified: Secondary | ICD-10-CM | POA: Insufficient documentation

## 2022-05-16 DIAGNOSIS — R296 Repeated falls: Secondary | ICD-10-CM | POA: Diagnosis present

## 2022-05-16 DIAGNOSIS — M5459 Other low back pain: Secondary | ICD-10-CM | POA: Insufficient documentation

## 2022-05-16 NOTE — Therapy (Signed)
OUTPATIENT PHYSICAL THERAPY TREATMENT NOTE    Patient Name: Travis Fitzpatrick MRN: [id number] DOB:09/04/1980, 41 y.o., male Today's Date: 05/16/22     PT End of Session - 05/16/22 1826     Visit Number 67    Number of Visits 47    Date for PT Re-Evaluation 06/25/22    Authorization Type MEDICARE PART B reporting period from 04/02/2022    Progress Note Due on Visit 50    PT Start Time 1815    PT Stop Time 1858    PT Time Calculation (min) 43 min    Activity Tolerance Patient limited by pain;Patient tolerated treatment well    Behavior During Therapy [facility] for tasks assessed/performed                Past Medical History:  Diagnosis Date   Bowel trouble    urgency   Medical history non-contributory    Urinary urgency    Past Surgical History:  Procedure Laterality Date   BACK SURGERY  2010   CIRCUMCISION     LUMBAR LAMINECTOMY/DECOMPRESSION MICRODISCECTOMY  07/20/2011   Procedure: LUMBAR LAMINECTOMY/DECOMPRESSION MICRODISCECTOMY;  Surgeon: Winfield Cunas;  Location: [place] [facility];  Service: Neurosurgery;  Laterality: N/A;  right thoracotomy with thoracic eight-nine discectomy and fusion   LUMBAR LAMINECTOMY/DECOMPRESSION MICRODISCECTOMY Right 10/06/2021   Procedure: Right Lumbar Four-Five Microdiscectomy, Right Lumbar Five-Sacal One Foraminotomy;  Surgeon: Ashok Pall, MD;  Location: [place];  Service: Neurosurgery;  Laterality: Right;  3C/RM 21   THORACIC DISCECTOMY  07/16/2012   Procedure: THORACIC DISCECTOMY;  Surgeon: Winfield Cunas, MD;  Location: [place] [facility];  Service: Neurosurgery;  Laterality: Right;  RIGHT Thoracic seven-eight  thoracic diskectomy via thoracotomy by dr Cyndia Bent   THORACIC DISCECTOMY N/A 12/15/2014   Procedure: THORACIC SEVEN TO THORACIC NINE Laminectomy ;  Surgeon: Ashok Pall, MD;  Location: [place] [facility];  Service: Neurosurgery;  Laterality: N/A;   THORACIC DISCECTOMY N/A 06/06/2017   Procedure: LAMINECTOMY THORACIC NINE-TEN;  Surgeon: Ashok Pall,  MD;  Location: [place];  Service: Neurosurgery;  Laterality: N/A;  LAMINECTOMY THORACIC NINE-TEN   THORACOTOMY  07/20/2011   Procedure: THORACOTOMY OPEN FOR SPINE SURGERY;  Surgeon: Pierre Bali, MD;  Location: [facility];  Service: Vascular;  Laterality: N/A;   THORACOTOMY  07/16/2012   Procedure: THORACOTOMY OPEN FOR SPINE SURGERY;  Surgeon: Gaye Pollack, MD;  Location: [place] [facility];  Service: Thoracic;  Laterality: N/A;   Patient Active Problem List   Diagnosis Date Noted   HNP (herniated nucleus pulposus), lumbar 10/06/2021   Hand pain 06/28/2021   Lumbar facet arthropathy 01/11/2021   Thoracic spondylosis with myelopathy 01/11/2021   Thoracic spinal stenosis 06/06/2017   Stenosis, spinal, thoracic 12/15/2014   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 09/22/2014   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/2011    PCP: [place] @ [facility]  REFERRING PROVIDER: Ashok Pall, MD  REFERRING DIAG: 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  PERTINENT HISTORY: Patient is a 41 y.o. male who presents  to outpatient physical therapy with a referral for medical diagnosis  intervertebral disc disorder of thoracic region with myelopathy. This patient's chief complaints consist of low back pain, R LE pain/paresthesia/weakness, L LE weakness s/p right L4-5 laminotomy, microdiscectomy on 10/06/2021 leading to the following functional deficits:  difficulty with bed mobility, transfers, driving, household and community ambulation, preventing falls, floor transfers, playing with his daughter, stairs, social participation and socializing with friends. 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 Patient denies hx of cancer, stroke, seizures, lung problems, heart problems, diabetes, unexplained weight loss, and osteoporosis. Patient reports he has difficulty with making it to the bathroom on time when he gets bowel or bladder urges and has stumbling. He has history of thoracic and lumbar surgeries.  PRECAUTIONS: falls. No post-op restrictions after 4 months from surgery.   PATIENT GOALS: get better and get off the walker to walk with [facility], play with his daughter (DOB 07/04/2020), get his strength back, be able to get up and down from floor.  SUBJECTIVE: Patient arrives with his rollator. Missed last session due to reports of inability to walk due to RLE weakness. Surgery scheduled for October 13th. Pain currently 3-4/10 NPS.    PAIN:  Are you having pain? 3-4/10 Location: low back and right groin, hip and down back of R leg to knee.  OBJECTIVE  FUNCTIONAL/BALANCE TESTS 6 Minute Walk Test: 84 feet with rollator (stopped by low back and R LE pain), pain up to 4/10.      TODAY'S TREATMENT  Therapeutic exercise: to centralize symptoms and improve ROM, strength, muscular endurance, and activity tolerance required for successful completion of functional activities.   - NuStep level 2 using bilateral upper and left lower extremity only. Seat/handle setting 11/11.  - hooklying low trunk  rotation, 1x2 min   - hooklying marching, 3x10 each side.   - hooklying isometric hip adduction against small pink theraball, 4 second holds, 2 min  - hooklying SAQ (knees supported by black bolsters from back treatment room cart), 3x10 R side with 5# AW, 3x15 L side with 20# AW.   Circuit (hooklying with R knee supported by black bolster from back treatment room cart):  - L hip abduction/adduction against manual resistance with knee extended and PT supporting leg at ankle just lifted from mat to reduce friction, 3x10.   - L hip resisted hamstring curl on furniture slider. 2x10.    Pt required multimodal cuing for proper technique and to facilitate improved neuromuscular control, strength, range of motion, and functional ability resulting in improved performance and form.  HOME EXERCISE PROGRAM:  Access Code: [email or web address] URL: [email or web address] Date: 11/13/2021 Prepared by: Rosita Kea  Exercises - Sit to Stand  - 1 x weekly - 2-3 sets - 5-10 reps - Seated Isometric Hip Adduction with Ball  - 1 x daily - 2-3 sets - 5-10 reps - 5 seconds hold - Seated Toe Raise  - 1 x daily - 2-3 sets - 10 reps - Seated Single Arm Shoulder Row with Anchored Resistance  - 1 x daily - 3 sets - 10 reps  PATIENT EDUCATION:  Education details: Form/technique with exercise. Rational for interventions.  Person educated: Patient Education method: Explanation,  Demonstration, Tactile cues, and Verbal cues Education comprehension: verbalized understanding, returned demonstration, verbal cues required, tactile cues required, and needs further education   ASSESSMENT:  CLINICAL IMPRESSION: Continuing PT POC with focus on supine and hook lying exercises. Pt remains limited in walking tolerance due to pain and RLE weakness. Continuing with PT prior to surgery as pt requires physical assist with positioning and exercises to preserve LLE strength to maximize functional independence. Hoping to  continue PT up until surgery to improve recovery outcomes post surgical intervention. PT will continue POC up until that point to work towards remaining deficits.    OBJECTIVE IMPAIRMENTS Abnormal gait, decreased activity tolerance, decreased balance, decreased coordination, decreased endurance, decreased knowledge of use of DME, decreased mobility, difficulty walking, decreased ROM, decreased strength, impaired perceived functional ability, impaired flexibility, impaired sensation, impaired tone, impaired UE functional use, postural dysfunction, obesity, and pain.   ACTIVITY LIMITATIONS cleaning, community activity, driving, occupation, laundry, shopping, and basic mobility such as bed mobility, transfers, driving, household and community ambulation, preventing falls, floor transfers, playing with his daughter, stairs, social participation and socializing with friends .   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    REHAB POTENTIAL: Good  CLINICAL DECISION MAKING: Evolving/moderate complexity  EVALUATION COMPLEXITY: Moderate   GOALS: Goals reviewed with patient? No  SHORT TERM GOALS: Target date: 11/15/2021  Be independent with initial home exercise program for self-management of symptoms. Baseline: initial HEP to be provided at visit 2 as appropriate (11/01/2021);  Goal status: MET  LONG TERM GOALS: Target date: 01/24/2022; Updated to 04/09/2022 on 01/15/2022. UPDATED to 06/25/2022 for all unmet  goals on 04/02/2022  Be independent with a long-term home exercise program for self-management of symptoms.  Baseline: Initial HEP to be provided at visit 2 as appropriate (11/01/2021); patient currently participating well (12/06/2021; 01/15/2022; 02/26/2022); participating as able (04/02/2022);  Goal status: IN PROGRESS  2.  Demonstrate improved FOTO to equal or greater than 42 by visit #14 to demonstrate improvement in overall condition and self-reported functional ability.  Baseline: 26 (11/01/2021); 45 at visit #10 (12/06/2021); 53 at visit# 20 (01/15/2022); 50 at visit #30 (02/26/2022); 53 at visit #40 (04/02/2022);  Goal status: ACHIEVED 12/06/2021  3.  Patient will complete 5 Time Sit To Stand Test in equal or less than 15 seconds for 18.5 inch surface with no UE support to demonstrate improved ability to complete transfers and decrease fall risk.  Baseline: 45 second from 18.5 inch plinth with R UE support on plinth and L UE support on locked rollator and B UE support on rollator in standing. Multiple attempts needed on some reps (11/01/2021); 21 second from 18.5 inch plinth with B UE support on plinth. Walker positioned in front of him for safety but did not need it. (12/06/2021); 21 second from 18.5 inch plinth with no UE support. Walker positioned in front of him for safety but did not need it (01/15/2022); 15 seconds from 18.5 inch plinth with no UE support. Walker positioned in front of him for safety but did not need it (02/26/2022); 23 second from 18.5 inch plinth with R  UE support on plinth. Walker positioned in front of him for safety. Unable to perform without UE support. Reports his R LE feels weak when he puts weight/pressure on it and it feels like a pinched nerve in his groin and hip (04/02/2022);  Goal status: IN PROGRESS  4.  Patient will ambulate equal or greater than 1200 feet on 6 Minute Walk Test with [facility]  to demonstrate improved household and community mobility as well as community and social  participation.  Baseline: to be tested visit 2 as appropriate (11/01/2021); 800 feet with rollator. Low back pain increased to 4-5/10 and had pain down R LE to calf (11/13/2021); 943 feet with SPC and SBA (two little standing breaks to stretch back due to tightness at low back, increased sharper pain in back and B legs, 12/06/2021);  948 feet with SPC (01/09/2022); 1045 feet with SPC no rests  but catches toes on floor at times (01/15/2022); 936 feet with SBA and mostly carrying cane for first 4 min of test. Scuffing feet on floor slightly. No stumbles. Sharp pain developed around 4 min in right glute/hip region worse on initial contact that remained after seated rest from R glute to anterior thigh to knee and in toes (02/26/2022); 773 feet with SBA with rollator. R LE gave way once during test and patient felt he could barely make it to the end. He recovered balance with use of B UE support on rollator. Reports sudden feeling that right LE will give way (04/02/2022);  Goal status: IN PROGRESS  5.  Complete community, work and/or recreational activities without limitation due to current condition.  Baseline: difficulty with bed mobility, transfers, driving, household and community ambulation, preventing falls, floor transfers, playing with his daughter, stairs, social participation and socializing with friends (11/01/2021); walking is getting easier, transfers are easier, doesn't have to use his rollator as much, stairs are still troublesome, has not tried floor transfer, still restricted socially and continues to have significant limitations with functional activities and mobility (12/06/2021); walking and transfers are easier and is using [place] at home instead of rollator, continues to be restricted socially and have significant limitations with functional activities, basic mobility, and balance (01/15/2022); driving is better and he is able to move his foot instead of his whole leg, still has not been able to get to the  floor and back up, it is getting a little easier to do things around the house, still hard to lift heavy boxes and lift his daughter, he can stand a little longer to do the dishes, vacuuming the floor is less difficult than before, he continues to have limitations in socialization and community mobility, still is concerned about falling, continues to have urgency with bowel and bladder (02/26/2022); everything has gotten a little harder around the house over the last couple of weeks due to right LE weakness, and groin, hip, low back pain (04/02/2022);  Goal status: IN PROGRESS   PLAN: PT FREQUENCY: 2x/week  PT DURATION: 12 weeks  PLANNED INTERVENTIONS: Therapeutic exercises, Therapeutic activity, Neuromuscular re-education, Balance training, Gait training, Patient/Family education, Joint mobilization, Stair training, DME instructions, Dry Needling, and Electrical stimulation.  PLAN FOR NEXT SESSION: Update HEP as tolerated. Progressive LE, core, functional strengthening and balance exercises as tolerated and appropriate.    Salem Caster. Fairly IV, PT, DPT Physical Therapist- [facility]  05/16/22, 7:02 PM

## 2022-05-21 ENCOUNTER — Encounter: Payer: Medicare Other | Admitting: Physical Therapy

## 2022-05-21 ENCOUNTER — Ambulatory Visit: Payer: Medicare Other | Admitting: Physical Therapy

## 2022-05-22 NOTE — Progress Notes (Signed)
Surgical Instructions    Your procedure is scheduled on Friday, 05/25/22.  Report to [facility] Main Entrance "A" at 6:30 A.M., then check in with the Admitting office.  Call this number if you have problems the morning of surgery:  [phone]   If you have any questions prior to your surgery date call [phone]: Open Monday-Friday 8am-4pm If you experience any cold or flu symptoms such as cough, fever, chills, shortness of breath, etc. between now and your scheduled surgery, please notify us at the above number     Remember:  Do not eat after midnight the night before your surgery  You may drink clear liquids until 5:30am the morning of your surgery.   Clear liquids allowed are: Water, Non-Citrus Juices (without pulp), Carbonated Beverages, Clear Tea, Black Coffee ONLY (NO MILK, CREAM OR POWDERED CREAMER of any kind), and Gatorade    Take these medicines the morning of surgery with A SIP OF WATER:  gabapentin (NEURONTIN)  IF NEEDED: loratadine (CLARITIN)   As of today, STOP taking any Aspirin (unless otherwise instructed by your surgeon) Aleve, Naproxen, Ibuprofen, Motrin, Advil, Goody's, BC's, all herbal medications, fish oil, diclofenac (VOLTAREN) and all vitamins.           Do not wear jewelry or makeup. Do not wear lotions, powders, cologne or deodorant. Men may shave face and neck. Do not bring valuables to the hospital. Do not wear nail polish, gel polish, artificial nails, or any other type of covering on natural nails (fingers and toes) If you have artificial nails or gel coating that need to be removed by a nail salon, please have this removed prior to surgery. Artificial nails or gel coating may interfere with anesthesia's ability to adequately monitor your vital signs.  [facility] is not responsible for any belongings or valuables.    Do NOT Smoke (Tobacco/Vaping)  24 hours prior to your procedure  If you use a CPAP at night, you may bring your mask for your  overnight stay.   Contacts, glasses, hearing aids, dentures or partials may not be worn into surgery, please bring cases for these belongings   For patients admitted to the hospital, discharge time will be determined by your treatment team.   Patients discharged the day of surgery will not be allowed to drive home, and someone needs to stay with them for 24 hours.   SURGICAL WAITING ROOM VISITATION Patients having surgery or a procedure may have no more than 2 support people in the waiting area - these visitors may rotate.   Children under the age of 22 must have an adult with them who is not the patient. If the patient needs to stay at the hospital during part of their recovery, the visitor guidelines for inpatient rooms apply. Pre-op nurse will coordinate an appropriate time for 1 support person to accompany patient in pre-op.  This support person may not rotate.   Please refer to the [facility] website for the visitor guidelines for Inpatients (after your surgery is over and you are in a regular room).    Special instructions:    Oral Hygiene is also important to reduce your risk of infection.  Remember - BRUSH YOUR TEETH THE MORNING OF SURGERY WITH YOUR REGULAR TOOTHPASTE   [place]- Preparing For Surgery  Before surgery, you can play an important role. Because skin is not sterile, your skin needs to be as free of germs as possible. You can reduce the number of germs on your  skin by washing with CHG (chlorahexidine gluconate) Soap before surgery.  CHG is an antiseptic cleaner which kills germs and bonds with the skin to continue killing germs even after washing.     Please do not use if you have an allergy to CHG or antibacterial soaps. If your skin becomes reddened/irritated stop using the CHG.  Do not shave (including legs and underarms) for at least 48 hours prior to first CHG shower. It is OK to shave your face.  Please follow these instructions carefully.     Shower the  NIGHT BEFORE SURGERY and the MORNING OF SURGERY with CHG Soap.   If you chose to wash your hair, wash your hair first as usual with your normal shampoo. After you shampoo, rinse your hair and body thoroughly to remove the shampoo.  Then [employer] and genitals (private parts) with your normal soap and rinse thoroughly to remove soap.  After that Use CHG Soap as you would any other liquid soap. You can apply CHG directly to the skin and wash gently with a scrungie or a clean washcloth.   Apply the CHG Soap to your body ONLY FROM THE NECK DOWN.  Do not use on open wounds or open sores. Avoid contact with your eyes, ears, mouth and genitals (private parts). Wash Face and genitals (private parts)  with your normal soap.   Wash thoroughly, paying special attention to the area where your surgery will be performed.  Thoroughly rinse your body with warm water from the neck down.  DO NOT shower/wash with your normal soap after using and rinsing off the CHG Soap.  Pat yourself dry with a CLEAN TOWEL.  Wear CLEAN PAJAMAS to bed the night before surgery  Place CLEAN SHEETS on your bed the night before your surgery  DO NOT SLEEP WITH PETS.   Day of Surgery: Take a shower with CHG soap. Wear Clean/Comfortable clothing the morning of surgery Do not apply any deodorants/lotions.   Remember to brush your teeth WITH YOUR REGULAR TOOTHPASTE.    If you received a COVID test during your pre-op visit, it is requested that you wear a mask when out in public, stay away from anyone that may not be feeling well, and notify your surgeon if you develop symptoms. If you have been in contact with anyone that has tested positive in the last 10 days, please notify your surgeon.    Please read over the following fact sheets that you were given.

## 2022-05-23 ENCOUNTER — Encounter (HOSPITAL_COMMUNITY)
Admission: RE | Admit: 2022-05-23 | Discharge: 2022-05-23 | Disposition: A | Discharge status: Home / Self Care | Payer: Medicare Other | Source: Ambulatory Visit | Attending: Neurosurgery | Admitting: Neurosurgery

## 2022-05-23 ENCOUNTER — Telehealth: Payer: Self-pay | Admitting: Physical Therapy

## 2022-05-23 ENCOUNTER — Encounter (HOSPITAL_COMMUNITY): Payer: Self-pay

## 2022-05-23 ENCOUNTER — Encounter: Payer: Medicare Other | Admitting: Physical Therapy

## 2022-05-23 ENCOUNTER — Other Ambulatory Visit: Payer: Self-pay

## 2022-05-23 ENCOUNTER — Ambulatory Visit: Payer: Medicare Other | Admitting: Physical Therapy

## 2022-05-23 VITALS — BP 128/82 | HR 88 | Temp 98.4°F | Resp 18 | Ht 70.0 in | Wt 340.8 lb

## 2022-05-23 DIAGNOSIS — Z01818 Encounter for other preprocedural examination: Secondary | ICD-10-CM

## 2022-05-23 DIAGNOSIS — Z01812 Encounter for preprocedural laboratory examination: Secondary | ICD-10-CM | POA: Insufficient documentation

## 2022-05-23 LAB — SURGICAL PCR SCREEN
MRSA, PCR: NEGATIVE
Staphylococcus aureus: NEGATIVE

## 2022-05-23 LAB — CBC
HCT: 37.7 % — ABNORMAL LOW (ref 39.0–52.0)
Hemoglobin: 12.3 g/dL — ABNORMAL LOW (ref 13.0–17.0)
MCH: 31.9 pg (ref 26.0–34.0)
MCHC: 32.6 g/dL (ref 30.0–36.0)
MCV: 97.7 fL (ref 80.0–100.0)
Platelets: 228 10*3/uL (ref 150–400)
RBC: 3.86 MIL/uL — ABNORMAL LOW (ref 4.22–5.81)
RDW: 13 % (ref 11.5–15.5)
WBC: 6.2 10*3/uL (ref 4.0–10.5)
nRBC: 0 % (ref 0.0–0.2)

## 2022-05-23 NOTE — Telephone Encounter (Signed)
Called patient to check on him prior to his PT appointment scheduled today and prior to his surgery scheduled for Friday. Patient states he was planning to come tonight but does admit that PT has been flaring up his symptoms. PT suggested not coming to PT tonight to minimize increased inflammation going into surgery to improve surgery experience. Patient agreed, stating he had already done a lot of walking earlier today to go to his pre-op appointment. Patient unsure when MD will want him to come back to PT. Advised him that he needs new PT order to return and to call us once he knows when MD wants him to start PT post op. Patient agreed.   Everlean Alstrom. Graylon Good, PT, DPT 05/23/22, 2:37 PM  [facility] [address] [place], [place] [address] P: [phone] I F: [phone]

## 2022-05-23 NOTE — Progress Notes (Signed)
PCP - [facility] in [place] - denies  PPM/ICD - denies   Chest x-ray - n/a EKG - n/a Stress Test - denies ECHO - denies Cardiac Cath - denies  Sleep Study - denies   Follow your surgeon's instructions on when to stop Aspirin.  If no instructions were given by your surgeon then you will need to call the office to get those instructions.     ERAS Protcol -yes   COVID TEST- not needed   Anesthesia review: no  Patient denies shortness of breath, fever, cough and chest pain at PAT appointment   All instructions explained to the patient, with a verbal understanding of the material. Patient agrees to go over the instructions while at home for a better understanding. Patient also instructed to self quarantine after being tested for COVID-19. The opportunity to ask questions was provided.

## 2022-05-24 LAB — TYPE AND SCREEN
ABO/RH(D): O POS
Antibody Screen: NEGATIVE

## 2022-05-24 NOTE — Anesthesia Preprocedure Evaluation (Addendum)
Anesthesia Evaluation  Patient identified by MRN, date of birth, ID band Patient awake    Reviewed: Allergy & Precautions, NPO status , Patient's Chart, lab work & pertinent test results  Airway Mallampati: III  TM Distance: >3 FB Neck ROM: Full    Dental no notable dental hx.    Pulmonary neg pulmonary ROS   Pulmonary exam normal        Cardiovascular negative cardio ROS  Rhythm:Regular Rate:Normal     Neuro/Psych negative neurological ROS  negative psych ROS   GI/Hepatic negative GI ROS, Neg liver ROS,,,  Endo/Other    Morbid obesity  Renal/GU negative Renal ROS  negative genitourinary   Musculoskeletal  (+) Arthritis , Osteoarthritis,    Abdominal  (+) + obese  Peds  Hematology negative hematology ROS (+)   Anesthesia Other Findings   Reproductive/Obstetrics                             Anesthesia Physical Anesthesia Plan  ASA: 3  Anesthesia Plan: General   Post-op Pain Management: Celebrex PO (pre-op)*, Tylenol PO (pre-op)* and Ketamine IV*   Induction: Intravenous  PONV Risk Score and Plan: 2 and Ondansetron, Dexamethasone, Midazolam and Treatment may vary due to age or medical condition  Airway Management Planned: Mask and Oral ETT  Additional Equipment: None  Intra-op Plan:   Post-operative Plan: Extubation in OR  Informed Consent: I have reviewed the patients History and Physical, chart, labs and discussed the procedure including the risks, benefits and alternatives for the proposed anesthesia with the patient or authorized representative who has indicated his/her understanding and acceptance.     Dental advisory given  Plan Discussed with: CRNA  Anesthesia Plan Comments: (Lab Results      Component                Value               Date                      WBC                      6.2                 05/23/2022                HGB                      12.3  (L)            05/23/2022                HCT                      37.7 (L)            05/23/2022                MCV                      97.7                05/23/2022                PLT                      228  05/23/2022           Lab Results      Component                Value               Date                      NA                       136                 10/03/2021                K                        3.9                 10/03/2021                CO2                      26                  10/03/2021                GLUCOSE                  100 (H)             10/03/2021                BUN                      14                  10/03/2021                CREATININE               0.94                10/03/2021                CALCIUM                  8.9                 10/03/2021                GFRNONAA                 >60                 10/03/2021          )        Anesthesia Quick Evaluation

## 2022-05-25 ENCOUNTER — Other Ambulatory Visit: Payer: Self-pay

## 2022-05-25 ENCOUNTER — Ambulatory Visit (HOSPITAL_COMMUNITY): Payer: Medicare Other

## 2022-05-25 ENCOUNTER — Encounter (HOSPITAL_COMMUNITY): Payer: Self-pay | Admitting: Neurosurgery

## 2022-05-25 ENCOUNTER — Ambulatory Visit (HOSPITAL_COMMUNITY): Payer: Medicare Other | Admitting: General Practice

## 2022-05-25 ENCOUNTER — Observation Stay (HOSPITAL_COMMUNITY)
Admission: RE | Admit: 2022-05-25 | Discharge: 2022-05-26 | Disposition: A | Discharge status: Home Health | Payer: Medicare Other | Attending: Neurosurgery | Admitting: Neurosurgery

## 2022-05-25 ENCOUNTER — Encounter (HOSPITAL_COMMUNITY)
Admission: RE | Disposition: A | Discharge status: Home Health | Payer: Self-pay | Source: Home / Self Care | Attending: Neurosurgery

## 2022-05-25 ENCOUNTER — Ambulatory Visit (HOSPITAL_BASED_OUTPATIENT_CLINIC_OR_DEPARTMENT_OTHER): Payer: Medicare Other | Admitting: General Practice

## 2022-05-25 DIAGNOSIS — M7138 Other bursal cyst, other site: Principal | ICD-10-CM | POA: Diagnosis present

## 2022-05-25 DIAGNOSIS — M1990 Unspecified osteoarthritis, unspecified site: Secondary | ICD-10-CM | POA: Insufficient documentation

## 2022-05-25 DIAGNOSIS — Z6841 Body Mass Index (BMI) 40.0 and over, adult: Secondary | ICD-10-CM

## 2022-05-25 DIAGNOSIS — R261 Paralytic gait: Secondary | ICD-10-CM | POA: Diagnosis not present

## 2022-05-25 DIAGNOSIS — Z981 Arthrodesis status: Secondary | ICD-10-CM

## 2022-05-25 DIAGNOSIS — M4316 Spondylolisthesis, lumbar region: Secondary | ICD-10-CM

## 2022-05-25 LAB — CBC
HCT: 38.9 % — ABNORMAL LOW (ref 39.0–52.0)
Hemoglobin: 12.4 g/dL — ABNORMAL LOW (ref 13.0–17.0)
MCH: 31.3 pg (ref 26.0–34.0)
MCHC: 31.9 g/dL (ref 30.0–36.0)
MCV: 98.2 fL (ref 80.0–100.0)
Platelets: 267 10*3/uL (ref 150–400)
RBC: 3.96 MIL/uL — ABNORMAL LOW (ref 4.22–5.81)
RDW: 12.9 % (ref 11.5–15.5)
WBC: 8.6 10*3/uL (ref 4.0–10.5)
nRBC: 0 % (ref 0.0–0.2)

## 2022-05-25 LAB — CREATININE, SERUM
Creatinine, Ser: 1.21 mg/dL (ref 0.61–1.24)
GFR, Estimated: >60 mL/min (ref >60)

## 2022-05-25 SURGERY — POSTERIOR LUMBAR FUSION 1 LEVEL
Anesthesia: General

## 2022-05-25 MED ORDER — SODIUM CHLORIDE 0.9 % IV SOLN: 250.0000 mL | INTRAVENOUS | Status: DC

## 2022-05-25 MED ORDER — GABAPENTIN 600 MG PO TABS
600.0000 mg | ORAL_TABLET | Freq: Three times a day (TID) | ORAL | Status: DC
  Administered 2022-05-25 – 2022-05-26 (×2): 600 mg via ORAL
  Filled 2022-05-25 (×2): qty 1

## 2022-05-25 MED ORDER — PHENOL 1.4 % MT LIQD: 1.0000 | OROMUCOSAL | Status: DC | PRN

## 2022-05-25 MED ORDER — ORAL CARE MOUTH RINSE: 15.0000 mL | Freq: Once | OROMUCOSAL | Status: AC

## 2022-05-25 MED ORDER — OXYCODONE HCL ER 10 MG PO T12A
10.0000 mg | EXTENDED_RELEASE_TABLET | Freq: Two times a day (BID) | ORAL | Status: DC
  Administered 2022-05-26: 10 mg via ORAL
  Filled 2022-05-25: qty 1

## 2022-05-25 MED ORDER — DEXAMETHASONE SODIUM PHOSPHATE 10 MG/ML IJ SOLN
INTRAMUSCULAR | Status: AC
  Filled 2022-05-25: qty 1

## 2022-05-25 MED ORDER — MIDAZOLAM HCL 2 MG/2ML IJ SOLN
INTRAMUSCULAR | Status: DC | PRN
  Administered 2022-05-25: 2 mg via INTRAVENOUS

## 2022-05-25 MED ORDER — ACETAMINOPHEN 650 MG RE SUPP: 650.0000 mg | RECTAL | Status: DC | PRN

## 2022-05-25 MED ORDER — HEPARIN SODIUM (PORCINE) 5000 UNIT/ML IJ SOLN
5000.0000 [IU] | Freq: Three times a day (TID) | INTRAMUSCULAR | Status: DC
  Filled 2022-05-25: qty 1

## 2022-05-25 MED ORDER — FENTANYL CITRATE (PF) 250 MCG/5ML IJ SOLN
INTRAMUSCULAR | Status: DC | PRN
  Administered 2022-05-25 (×4): 50 ug via INTRAVENOUS
  Administered 2022-05-25: 100 ug via INTRAVENOUS

## 2022-05-25 MED ORDER — CHLORHEXIDINE GLUCONATE CLOTH 2 % EX PADS: 6.0000 | MEDICATED_PAD | Freq: Once | CUTANEOUS | Status: DC

## 2022-05-25 MED ORDER — POTASSIUM CHLORIDE IN NACL 20-0.9 MEQ/L-% IV SOLN: INTRAVENOUS | Status: DC

## 2022-05-25 MED ORDER — PROPOFOL 10 MG/ML IV BOLUS
INTRAVENOUS | Status: AC
  Filled 2022-05-25: qty 20

## 2022-05-25 MED ORDER — CEFAZOLIN SODIUM 1 G IJ SOLR
INTRAMUSCULAR | Status: AC
  Filled 2022-05-25: qty 10

## 2022-05-25 MED ORDER — ACETAMINOPHEN 500 MG PO TABS
1000.0000 mg | ORAL_TABLET | Freq: Once | ORAL | Status: AC
  Administered 2022-05-25: 1000 mg via ORAL
  Filled 2022-05-25: qty 2

## 2022-05-25 MED ORDER — CHLORHEXIDINE GLUCONATE 0.12 % MT SOLN
15.0000 mL | Freq: Once | OROMUCOSAL | Status: AC
  Administered 2022-05-25: 15 mL via OROMUCOSAL
  Filled 2022-05-25: qty 15

## 2022-05-25 MED ORDER — ONDANSETRON HCL 4 MG/2ML IJ SOLN
INTRAMUSCULAR | Status: AC
  Filled 2022-05-25: qty 2

## 2022-05-25 MED ORDER — FENTANYL CITRATE (PF) 100 MCG/2ML IJ SOLN
INTRAMUSCULAR | Status: AC
  Filled 2022-05-25: qty 2

## 2022-05-25 MED ORDER — 0.9 % SODIUM CHLORIDE (POUR BTL) OPTIME
TOPICAL | Status: DC | PRN
  Administered 2022-05-25 (×2): 1000 mL

## 2022-05-25 MED ORDER — LIDOCAINE-EPINEPHRINE 0.5 %-1:200000 IJ SOLN
INTRAMUSCULAR | Status: DC | PRN
  Administered 2022-05-25: 10 mL

## 2022-05-25 MED ORDER — PHENYLEPHRINE 80 MCG/ML (10ML) SYRINGE FOR IV PUSH (FOR BLOOD PRESSURE SUPPORT)
PREFILLED_SYRINGE | INTRAVENOUS | Status: DC | PRN
  Administered 2022-05-25 (×2): 80 ug via INTRAVENOUS

## 2022-05-25 MED ORDER — SODIUM CHLORIDE 0.9% FLUSH: 3.0000 mL | INTRAVENOUS | Status: DC | PRN

## 2022-05-25 MED ORDER — LIDOCAINE-EPINEPHRINE 0.5 %-1:200000 IJ SOLN
INTRAMUSCULAR | Status: AC
  Filled 2022-05-25: qty 1

## 2022-05-25 MED ORDER — THROMBIN 20000 UNITS EX SOLR: CUTANEOUS | Status: DC | PRN

## 2022-05-25 MED ORDER — ACETAMINOPHEN 500 MG PO TABS
1000.0000 mg | ORAL_TABLET | Freq: Four times a day (QID) | ORAL | Status: AC
  Administered 2022-05-25 – 2022-05-26 (×4): 1000 mg via ORAL
  Filled 2022-05-25 (×4): qty 2

## 2022-05-25 MED ORDER — KETAMINE HCL-SODIUM CHLORIDE 100-0.9 MG/10ML-% IV SOSY
PREFILLED_SYRINGE | INTRAVENOUS | Status: DC | PRN
  Administered 2022-05-25 (×2): 10 mg via INTRAVENOUS
  Administered 2022-05-25: 50 mg via INTRAVENOUS
  Administered 2022-05-25: 10 mg via INTRAVENOUS

## 2022-05-25 MED ORDER — OXYCODONE HCL 5 MG PO TABS
10.0000 mg | ORAL_TABLET | ORAL | Status: DC | PRN
  Administered 2022-05-25 – 2022-05-26 (×2): 10 mg via ORAL
  Filled 2022-05-25 (×2): qty 2

## 2022-05-25 MED ORDER — BUPIVACAINE HCL (PF) 0.5 % IJ SOLN
INTRAMUSCULAR | Status: AC
  Filled 2022-05-25: qty 30

## 2022-05-25 MED ORDER — DOCUSATE SODIUM 100 MG PO CAPS
100.0000 mg | ORAL_CAPSULE | Freq: Two times a day (BID) | ORAL | Status: DC
  Administered 2022-05-25 – 2022-05-26 (×2): 100 mg via ORAL
  Filled 2022-05-25 (×2): qty 1

## 2022-05-25 MED ORDER — LORATADINE 10 MG PO TABS: 10.0000 mg | ORAL_TABLET | Freq: Every day | ORAL | Status: DC | PRN

## 2022-05-25 MED ORDER — ROCURONIUM BROMIDE 10 MG/ML (PF) SYRINGE
PREFILLED_SYRINGE | INTRAVENOUS | Status: DC | PRN
  Administered 2022-05-25: 30 mg via INTRAVENOUS
  Administered 2022-05-25 (×2): 20 mg via INTRAVENOUS
  Administered 2022-05-25: 70 mg via INTRAVENOUS

## 2022-05-25 MED ORDER — CEFAZOLIN IN SODIUM CHLORIDE 3-0.9 GM/100ML-% IV SOLN
3.0000 g | INTRAVENOUS | Status: AC
  Administered 2022-05-25: 3 g via INTRAVENOUS
  Filled 2022-05-25: qty 100

## 2022-05-25 MED ORDER — BISACODYL 5 MG PO TBEC: 5.0000 mg | DELAYED_RELEASE_TABLET | Freq: Every day | ORAL | Status: DC | PRN

## 2022-05-25 MED ORDER — LIDOCAINE 2% (20 MG/ML) 5 ML SYRINGE
INTRAMUSCULAR | Status: AC
  Filled 2022-05-25: qty 5

## 2022-05-25 MED ORDER — KETAMINE HCL 50 MG/5ML IJ SOSY
PREFILLED_SYRINGE | INTRAMUSCULAR | Status: AC
  Filled 2022-05-25: qty 10

## 2022-05-25 MED ORDER — ACETAMINOPHEN 325 MG PO TABS: 650.0000 mg | ORAL_TABLET | ORAL | Status: DC | PRN

## 2022-05-25 MED ORDER — GLYCOPYRROLATE PF 0.2 MG/ML IJ SOSY
PREFILLED_SYRINGE | INTRAMUSCULAR | Status: DC | PRN
  Administered 2022-05-25 (×2): .1 mg via INTRAVENOUS

## 2022-05-25 MED ORDER — CEFAZOLIN SODIUM-DEXTROSE 2-4 GM/100ML-% IV SOLN
2.0000 g | Freq: Three times a day (TID) | INTRAVENOUS | Status: AC
  Administered 2022-05-25 – 2022-05-26 (×2): 2 g via INTRAVENOUS
  Filled 2022-05-25 (×2): qty 100

## 2022-05-25 MED ORDER — SODIUM CHLORIDE 0.9% FLUSH
3.0000 mL | Freq: Two times a day (BID) | INTRAVENOUS | Status: DC
  Administered 2022-05-25 – 2022-05-26 (×2): 3 mL via INTRAVENOUS

## 2022-05-25 MED ORDER — SENNOSIDES-DOCUSATE SODIUM 8.6-50 MG PO TABS: 1.0000 | ORAL_TABLET | Freq: Every evening | ORAL | Status: DC | PRN

## 2022-05-25 MED ORDER — MUPIROCIN 2 % EX OINT
TOPICAL_OINTMENT | CUTANEOUS | Status: AC
  Administered 2022-05-25: 1 via TOPICAL
  Filled 2022-05-25: qty 22

## 2022-05-25 MED ORDER — FENTANYL CITRATE (PF) 250 MCG/5ML IJ SOLN
INTRAMUSCULAR | Status: AC
  Filled 2022-05-25: qty 5

## 2022-05-25 MED ORDER — PHENYLEPHRINE HCL-NACL 20-0.9 MG/250ML-% IV SOLN
INTRAVENOUS | Status: DC | PRN
  Administered 2022-05-25: 20 ug/min via INTRAVENOUS

## 2022-05-25 MED ORDER — OXYCODONE HCL 5 MG PO TABS: 5.0000 mg | ORAL_TABLET | ORAL | Status: DC | PRN

## 2022-05-25 MED ORDER — FENTANYL CITRATE (PF) 100 MCG/2ML IJ SOLN
25.0000 ug | INTRAMUSCULAR | Status: DC | PRN
  Administered 2022-05-25: 50 ug via INTRAVENOUS

## 2022-05-25 MED ORDER — PHENYLEPHRINE 80 MCG/ML (10ML) SYRINGE FOR IV PUSH (FOR BLOOD PRESSURE SUPPORT)
PREFILLED_SYRINGE | INTRAVENOUS | Status: AC
  Filled 2022-05-25: qty 10

## 2022-05-25 MED ORDER — MENTHOL 3 MG MT LOZG: 1.0000 | LOZENGE | OROMUCOSAL | Status: DC | PRN

## 2022-05-25 MED ORDER — PROPOFOL 10 MG/ML IV BOLUS
INTRAVENOUS | Status: DC | PRN
  Administered 2022-05-25: 280 mg via INTRAVENOUS

## 2022-05-25 MED ORDER — MIDAZOLAM HCL 2 MG/2ML IJ SOLN
INTRAMUSCULAR | Status: AC
  Filled 2022-05-25: qty 2

## 2022-05-25 MED ORDER — ONDANSETRON HCL 4 MG PO TABS: 4.0000 mg | ORAL_TABLET | Freq: Four times a day (QID) | ORAL | Status: DC | PRN

## 2022-05-25 MED ORDER — MUPIROCIN 2 % EX OINT: 1.0000 | TOPICAL_OINTMENT | Freq: Once | CUTANEOUS | Status: AC

## 2022-05-25 MED ORDER — GLYCOPYRROLATE PF 0.2 MG/ML IJ SOSY
PREFILLED_SYRINGE | INTRAMUSCULAR | Status: AC
  Filled 2022-05-25: qty 1

## 2022-05-25 MED ORDER — DEXAMETHASONE SODIUM PHOSPHATE 10 MG/ML IJ SOLN
INTRAMUSCULAR | Status: DC | PRN
  Administered 2022-05-25: 10 mg via INTRAVENOUS

## 2022-05-25 MED ORDER — DIAZEPAM 5 MG PO TABS: 5.0000 mg | ORAL_TABLET | Freq: Four times a day (QID) | ORAL | Status: DC | PRN

## 2022-05-25 MED ORDER — MORPHINE SULFATE (PF) 2 MG/ML IV SOLN: 4.0000 mg | INTRAVENOUS | Status: DC | PRN

## 2022-05-25 MED ORDER — SUGAMMADEX SODIUM 200 MG/2ML IV SOLN
INTRAVENOUS | Status: DC | PRN
  Administered 2022-05-25: 400 mg via INTRAVENOUS

## 2022-05-25 MED ORDER — LIDOCAINE 2% (20 MG/ML) 5 ML SYRINGE
INTRAMUSCULAR | Status: DC | PRN
  Administered 2022-05-25: 80 mg via INTRAVENOUS

## 2022-05-25 MED ORDER — ROCURONIUM BROMIDE 10 MG/ML (PF) SYRINGE
PREFILLED_SYRINGE | INTRAVENOUS | Status: AC
  Filled 2022-05-25: qty 10

## 2022-05-25 MED ORDER — FLEET ENEMA 7-19 GM/118ML RE ENEM: 1.0000 | ENEMA | Freq: Once | RECTAL | Status: DC | PRN

## 2022-05-25 MED ORDER — OXYCODONE HCL 5 MG/5ML PO SOLN: 5.0000 mg | Freq: Once | ORAL | Status: DC | PRN

## 2022-05-25 MED ORDER — LACTATED RINGERS IV SOLN: INTRAVENOUS | Status: DC

## 2022-05-25 MED ORDER — THROMBIN 20000 UNITS EX SOLR
CUTANEOUS | Status: AC
  Filled 2022-05-25: qty 20000

## 2022-05-25 MED ORDER — ONDANSETRON HCL 4 MG/2ML IJ SOLN
INTRAMUSCULAR | Status: DC | PRN
  Administered 2022-05-25: 4 mg via INTRAVENOUS

## 2022-05-25 MED ORDER — ONDANSETRON HCL 4 MG/2ML IJ SOLN: 4.0000 mg | Freq: Four times a day (QID) | INTRAMUSCULAR | Status: DC | PRN

## 2022-05-25 MED ORDER — OXYCODONE HCL 5 MG PO TABS: 5.0000 mg | ORAL_TABLET | Freq: Once | ORAL | Status: DC | PRN

## 2022-05-25 SURGICAL SUPPLY — 55 items
BAG COUNTER SPONGE SURGICOUNT (BAG) ×1 IMPLANT
BASKET BONE COLLECTION (BASKET) ×1 IMPLANT
BIT DRILL PLIF MAS DISP 5.5MM (DRILL) IMPLANT
BLADE BONE MILL MEDIUM (MISCELLANEOUS) ×1 IMPLANT
BUR MATCHSTICK NEURO 3.0 LAGG (BURR) ×1 IMPLANT
BUR PRECISION FLUTE 5.0 (BURR) ×1 IMPLANT
CANISTER SUCT 3000ML PPV (MISCELLANEOUS) ×1 IMPLANT
CAP RELINE MOD TULIP RMM (Cap) IMPLANT
CNTNR URN SCR LID CUP LEK RST (MISCELLANEOUS) ×1 IMPLANT
CONT SPEC 4OZ STRL OR WHT (MISCELLANEOUS) ×1
COVER BACK TABLE 60X90IN (DRAPES) ×1 IMPLANT
DERMABOND ADVANCED .7 DNX12 (GAUZE/BANDAGES/DRESSINGS) ×1 IMPLANT
DERMABOND ADVANCED .7 DNX6 (GAUZE/BANDAGES/DRESSINGS) IMPLANT
DRAPE C-ARM 42X72 X-RAY (DRAPES) ×2 IMPLANT
DRAPE C-ARMOR (DRAPES) IMPLANT
DRAPE LAPAROTOMY 100X72X124 (DRAPES) ×1 IMPLANT
DRAPE SURG 17X23 STRL (DRAPES) ×1 IMPLANT
DRILL PLIF MAS DISP 5.5MM (DRILL) ×1
DRSG OPSITE POSTOP 4X8 (GAUZE/BANDAGES/DRESSINGS) IMPLANT
DURAPREP 26ML APPLICATOR (WOUND CARE) ×1 IMPLANT
ELECT REM PT RETURN 9FT ADLT (ELECTROSURGICAL) ×1
ELECTRODE REM PT RTRN 9FT ADLT (ELECTROSURGICAL) ×1 IMPLANT
GLOVE SURG LTX SZ6.5 (GLOVE) ×2 IMPLANT
GOWN STRL REUS W/ TWL LRG LVL3 (GOWN DISPOSABLE) ×2 IMPLANT
GOWN STRL REUS W/ TWL XL LVL3 (GOWN DISPOSABLE) IMPLANT
GOWN STRL REUS W/TWL 2XL LVL3 (GOWN DISPOSABLE) IMPLANT
GOWN STRL REUS W/TWL LRG LVL3 (GOWN DISPOSABLE) ×3
GOWN STRL REUS W/TWL XL LVL3 (GOWN DISPOSABLE)
KIT BASIN OR (CUSTOM PROCEDURE TRAY) ×1 IMPLANT
KIT POSITION SURG JACKSON T1 (MISCELLANEOUS) ×1 IMPLANT
KIT TURNOVER KIT B (KITS) ×1 IMPLANT
MILL BONE PREP (MISCELLANEOUS) IMPLANT
NDL HYPO 25X1 1.5 SAFETY (NEEDLE) ×1 IMPLANT
NDL SPNL 18GX3.5 QUINCKE PK (NEEDLE) IMPLANT
NEEDLE HYPO 25X1 1.5 SAFETY (NEEDLE) ×1 IMPLANT
NEEDLE SPNL 18GX3.5 QUINCKE PK (NEEDLE) ×1 IMPLANT
NS IRRIG 1000ML POUR BTL (IV SOLUTION) ×1 IMPLANT
PACK LAMINECTOMY NEURO (CUSTOM PROCEDURE TRAY) ×1 IMPLANT
PAD ARMBOARD 7.5X6 YLW CONV (MISCELLANEOUS) ×2 IMPLANT
ROD RELINE COCR LORD 5.0X35 (Rod) IMPLANT
SCREW LOCK RSS 4.5/5.0MM (Screw) IMPLANT
SCREW SHANK RELINE MOD 5.5X35 (Screw) IMPLANT
SCREW SHANK RELINE MOD 6.5X35 (Screw) IMPLANT
SPACER EXP PROLIFT 8X28X10 (Spacer) IMPLANT
SPONGE SURGIFOAM ABS GEL 100 (HEMOSTASIS) ×1 IMPLANT
SPONGE T-LAP 4X18 ~~LOC~~+RFID (SPONGE) IMPLANT
STRIP CLOSURE SKIN 1/2X4 (GAUZE/BANDAGES/DRESSINGS) IMPLANT
SUT VIC AB 0 CT1 18XCR BRD8 (SUTURE) ×1 IMPLANT
SUT VIC AB 0 CT1 8-18 (SUTURE) ×1
SUT VIC AB 2-0 CT1 18 (SUTURE) ×1 IMPLANT
SUT VIC AB 3-0 SH 8-18 (SUTURE) ×1 IMPLANT
TOWEL GREEN STERILE (TOWEL DISPOSABLE) ×1 IMPLANT
TOWEL GREEN STERILE FF (TOWEL DISPOSABLE) ×1 IMPLANT
TRAY FOLEY MTR SLVR 16FR STAT (SET/KITS/TRAYS/PACK) ×1 IMPLANT
WATER STERILE IRR 1000ML POUR (IV SOLUTION) ×1 IMPLANT

## 2022-05-25 NOTE — H&P (Signed)
BP 123/78   Pulse 95   Temp 98.1 F (36.7 C) (Oral)   Resp 18   Ht 5\' 10"  (1.778 m)   Wt (!) 154.2 kg   SpO2 96%   BMI 48.78 kg/m     Mr. Loren returns today at [employer] of his physical therapist.  He fell during therapy and he says that his right leg, though it had been better, is now worse again and that has been over the last 2 weeks.  He did have an MRI done last week and shows a big cystic structure which is in the canal compromising it significantly. He has fluid in both facet joints, both right and left.  Right side is the only one that was operated on.  At this point, given the small amount of listhesis present at this level and what appears to be facet incompetence and the cyst within the spinal canal, I believe he needs to undergo an arthrodesis at L4-5.  I discussed this with him and he is in agreement.  We will just go ahead and get this scheduled.  He continues to have a spastic gait.  He uses a walker.  He looks as he did the last time I saw him a few months ago, but the fall and the pain, he says, has returned and will therefore just get this done. Allergies  Allergen Reactions   Aspirin Hives and Rash   Lactose Intolerance (Gi) Other (See Comments)    Upset stomach / constipation    Iodine Nausea Only   Other Itching and Other (See Comments)    Apples,oranges, grapes (skin on outside)-  throat itches   Past Medical History:  Diagnosis Date   Bowel trouble    urgency   Medical history non-contributory    Urinary urgency    Past Surgical History:  Procedure Laterality Date   BACK SURGERY  2010   CIRCUMCISION     LUMBAR LAMINECTOMY/DECOMPRESSION MICRODISCECTOMY  07/20/2011   Procedure: LUMBAR LAMINECTOMY/DECOMPRESSION MICRODISCECTOMY;  Surgeon: Winfield Cunas;  Location: [facility];  Service: Neurosurgery;  Laterality: N/A;  right thoracotomy with thoracic eight-nine discectomy and fusion   LUMBAR LAMINECTOMY/DECOMPRESSION MICRODISCECTOMY Right 10/06/2021    Procedure: Right Lumbar Four-Five Microdiscectomy, Right Lumbar Five-Sacal One Foraminotomy;  Surgeon: Ashok Pall, MD;  Location: [place];  Service: Neurosurgery;  Laterality: Right;  3C/RM 21   THORACIC DISCECTOMY  07/16/2012   Procedure: THORACIC DISCECTOMY;  Surgeon: Winfield Cunas, MD;  Location: [facility];  Service: Neurosurgery;  Laterality: Right;  RIGHT Thoracic seven-eight  thoracic diskectomy via thoracotomy by dr Cyndia Bent   THORACIC DISCECTOMY N/A 12/15/2014   Procedure: THORACIC SEVEN TO THORACIC NINE Laminectomy ;  Surgeon: Ashok Pall, MD;  Location: [facility];  Service: Neurosurgery;  Laterality: N/A;   THORACIC DISCECTOMY N/A 06/06/2017   Procedure: LAMINECTOMY THORACIC NINE-TEN;  Surgeon: Ashok Pall, MD;  Location: [facility] [place];  Service: Neurosurgery;  Laterality: N/A;  LAMINECTOMY THORACIC NINE-TEN   THORACOTOMY  07/20/2011   Procedure: THORACOTOMY OPEN FOR SPINE SURGERY;  Surgeon: Pierre Bali, MD;  Location: [facility];  Service: Vascular;  Laterality: N/A;   THORACOTOMY  07/16/2012   Procedure: THORACOTOMY OPEN FOR SPINE SURGERY;  Surgeon: Gaye Pollack, MD;  Location: [facility];  Service: Thoracic;  Laterality: N/A;   Family History  Problem Relation Age of Onset   Hypertension Father    Social History   Socioeconomic History   Marital status: Single  Spouse name: Not on file   Number of children: Not on file   Years of education: Not on file   Highest education level: Not on file  Occupational History   Not on file  Tobacco Use   Smoking status: Never   Smokeless tobacco: Never  Vaping Use   Vaping Use: Never used  Substance and Sexual Activity   Alcohol use: No    Alcohol/week: 0.0 standard drinks of alcohol   Drug use: No   Sexual activity: Never  Other Topics Concern   Not on file  Social History Narrative   Not on file   Social Determinants of Health   Financial Resource Strain: Not on file  Food Insecurity: Not on file  Transportation  Needs: Not on file  Physical Activity: Not on file  Stress: Not on file  Social Connections: Not on file  Intimate Partner Violence: Not on file   Physical Exam Constitutional:      Appearance: Normal appearance. He is obese.  HENT:     Head: Normocephalic and atraumatic.     Right Ear: Ear canal and external ear normal.     Left Ear: Ear canal and external ear normal.     Nose: Nose normal.     Mouth/Throat:     Mouth: Mucous membranes are moist.     Pharynx: Oropharynx is clear.  Eyes:     Extraocular Movements: Extraocular movements intact.     Pupils: Pupils are equal, round, and reactive to light.  Cardiovascular:     Rate and Rhythm: Normal rate and regular rhythm.  Pulmonary:     Effort: Pulmonary effort is normal.  Abdominal:     Palpations: Abdomen is soft.  Musculoskeletal:        General: Normal range of motion.     Cervical back: Normal range of motion.  Skin:    General: Skin is warm and dry.  Neurological:     General: No focal deficit present.     Mental Status: He is alert and oriented to person, place, and time. Mental status is at baseline.     Cranial Nerves: No cranial nerve deficit.     Sensory: No sensory deficit.     Coordination: Coordination abnormal.     Gait: Gait abnormal.     Deep Tendon Reflexes: Reflexes abnormal.  Psychiatric:        Mood and Affect: Mood normal.        Behavior: Behavior normal.        Thought Content: Thought content normal.        Judgment: Judgment normal.

## 2022-05-25 NOTE — Transfer of Care (Signed)
Immediate Anesthesia Transfer of Care Note  Patient: Travis Fitzpatrick  Procedure(s) Performed: LUMBAR FOUR-FIVE POSTERIOR LUMBAR INTERBODY FUSION  Patient Location: PACU  Anesthesia Type:General  Level of Consciousness: awake and drowsy  Airway & Oxygen Therapy: Patient Spontanous Breathing and Patient connected to face mask oxygen  Post-op Assessment: Report given to RN and Post -op Vital signs reviewed and stable  Post vital signs: Reviewed and stable  Last Vitals:  Vitals Value Taken Time  BP 141/69 05/25/22 1445  Temp    Pulse 96 05/25/22 1447  Resp 21 05/25/22 1447  SpO2 93 % 05/25/22 1447  Vitals shown include unvalidated device data.  Last Pain:  Vitals:   05/25/22 0657  TempSrc:   PainSc: 6       Patients Stated Pain Goal: 0 (53/97/67 3419)  Complications: No notable events documented.

## 2022-05-25 NOTE — Op Note (Signed)
05/25/2022  2:54 PM  PATIENT:  Travis Fitzpatrick  41 y.o. male With a synovial cyst at L4/5 on the right PRE-OPERATIVE DIAGNOSIS:  L4/5 Spondylolisthesis, Lumbar region  POST-OPERATIVE DIAGNOSIS:  L4/5 Spondylolisthesis, Lumbar region  PROCEDURE:  Procedure(s): LUMBAR FOUR-FIVE POSTERIOR LUMBAR INTERBODY FUSION, Expandable Stryker titanium cages filled with autograft morsels, as well as the disc space Laminectomy L4 beyond the needed exposure for the PLIF Non segmental pedicle screw fixation Nuvasive screws L4, L5 SURGEON:  Surgeon(s): Ashok Pall, MD Kary Kos, MD  ASSISTANTS:Cram, Dominica Severin  ANESTHESIA:   general  EBL:  Total I/O In: 1600 [I.V.:1500; IV Piggyback:100] Out: 800 [Urine:550; Blood:250]  BLOOD ADMINISTERED:none  CELL SAVER GIVEN:none  COUNT:per nursing  DRAINS: none   SPECIMEN:  No Specimen  DICTATION: Dewitte Travis Fitzpatrick is a 41 y.o. male whom was taken to the operating room intubated, and placed under a general anesthetic without difficulty. A foley catheter was placed under sterile conditions. He was positioned prone on a Jackson table with all pressure points properly padded.  His lumbar region was prepped and draped in a sterile manner. I infiltrated 0cc's 1/2%lidocaine/1:2000,000 strength epinephrine into the planned incision. I opened the skin with a 10 blade and took the incision down to the thoracolumbar fascia. I exposed the lamina of L4, and L5 in a subperiosteal fashion bilaterally. I confirmed my location with an intraoperative xray.  I placed self retaining retractors and started the decompression.  I decompressed the spinal canal via a gross total laminectomy of L4. I used the drill and Kerrison punches to remove the bone and effect the decompression of the spinal canal and the L4 nerve root decompression in the foramina and the lateral recesses. This exposure was well beyond the needed exposure for the PLIF. PLIF's were performed at L4/5 in the same  fashion. I opened the disc space with a 15 blade then used a variety of instruments to remove the disc and prepare the space for the arthrodesis. I used curettes, rongeurs, punches, shavers for the disc space, and rasps in the discetomy. I measured the disc space and placed 2 titanium expandable cages filled with autograft morsels (Stryker) into the disc space(s). I packed the disc space with the morsels also . I placed pedicle screws at L4 and L5, using fluoroscopic guidance. I drilled a pilot hole, then cannulated the pedicle with a drill at each site. I then tapped each pedicle, assessing each site for pedicle violations. No cutouts were appreciated. Screws Harlin Heys) were then placed at each site without difficulty. We attached rods and locking caps with the appropriate tools. The locking caps were secured with torque limited screwdrivers. Final films were performed and the final construct appeared to be in good position.  We closed the wound in a layered fashion. We approximated the thoracolumbar fascia, subcutaneous, and subcuticular planes with vicryl sutures. I used dermabond and an occlusive bandage for a sterile dressing.     PLAN OF CARE: Admit to inpatient   PATIENT DISPOSITION:  PACU - hemodynamically stable.   Delay start of Pharmacological VTE agent (>24hrs) due to surgical blood loss or risk of bleeding:  yes

## 2022-05-25 NOTE — Anesthesia Procedure Notes (Signed)
Procedure Name: Intubation Date/Time: 05/25/2022 9:42 AM  Performed by: Dorann Lodge, CRNAPre-anesthesia Checklist: Patient identified, Emergency Drugs available, Suction available and Patient being monitored Patient Re-evaluated:Patient Re-evaluated prior to induction Oxygen Delivery Method: Circle System Utilized Preoxygenation: Pre-oxygenation with 100% oxygen Induction Type: IV induction Ventilation: Two handed mask ventilation required and Oral airway inserted - appropriate to patient size Laryngoscope Size: Glidescope and 4 Grade View: Grade I Tube type: Oral Tube size: 7.5 mm Number of attempts: 1 Airway Equipment and Method: Oral airway, Video-laryngoscopy and Rigid stylet Placement Confirmation: ETT inserted through vocal cords under direct vision, positive ETCO2 and breath sounds checked- equal and bilateral Secured at: 22 cm Tube secured with: Tape Dental Injury: Teeth and Oropharynx as per pre-operative assessment

## 2022-05-26 DIAGNOSIS — M4316 Spondylolisthesis, lumbar region: Secondary | ICD-10-CM | POA: Diagnosis not present

## 2022-05-26 MED ORDER — OXYCODONE HCL 5 MG PO TABS: 5.0000 mg | ORAL_TABLET | Freq: Four times a day (QID) | ORAL | 0 refills | Status: AC | PRN

## 2022-05-26 NOTE — Discharge Summary (Signed)
Physician Discharge Summary     Providing Compassionate, Quality Care - Together   Patient ID: Travis Fitzpatrick MRN: [id number] DOB/AGE: 1981/07/12 41 y.o.  Admit date: 05/25/2022 Discharge date: 05/26/2022  Admission Diagnoses: Synovial cyst of lumbar facet joint  Discharge Diagnoses:  Principal Problem:   S/P lumbar spinal fusion Active Problems:   Synovial cyst of lumbar facet joint   Discharged Condition: good  Hospital Course: Patient underwent an L4-5 PLIF by Dr. Christella Noa on 05/25/2022. He was admitted to 3W14 following recovery from anesthesia in the PACU. His postoperative course has been uncomplicated. He has worked with both physical and occupational therapies who feel the patient is ready for discharge home with Home Health PT and OT. He is ambulating with assistance. He is tolerating a normal diet. He is not having any bowel or bladder dysfunction. His pain is well-controlled with oral pain medication. He is ready for discharge home with home health therapies.   Consults: PT/OT  Significant Diagnostic Studies: radiology: DG Lumbar Spine 1 View  Result Date: 05/25/2022 CLINICAL DATA:  L4-5 posterior fusion.  Intraoperative fluoroscopy. EXAM: LUMBAR SPINE - 1 VIEW COMPARISON:  MRI lumbar spine 04/26/2022 FINDINGS: Images were performed intraoperatively without the presence of a radiologist. Single frontal view of the lumbar spine. There are overlying retractors. The patient is undergoing placement of an intervertebral disc spacer. Transpedicular screws are seen at the approximate L4 and L5 levels. Total fluoroscopy images: 1 Total fluoroscopy time: 77 seconds Total dose: Radiation Exposure Index (as provided by the fluoroscopic device): 115 mGy air Kerma Please see intraoperative findings for further detail. IMPRESSION: Intraoperative fluoroscopy provided during L4-5 posterior fusion. Electronically Signed   By: Yvonne Kendall M.D.   On: 05/25/2022 15:32   DG C-Arm 1-60  Min-No Report  Result Date: 05/25/2022 Fluoroscopy was utilized by the requesting physician.  No radiographic interpretation.   DG C-Arm 1-60 Min-No Report  Result Date: 05/25/2022 Fluoroscopy was utilized by the requesting physician.  No radiographic interpretation.   DG C-Arm 1-60 Min-No Report  Result Date: 05/25/2022 Fluoroscopy was utilized by the requesting physician.  No radiographic interpretation.   DG C-Arm 1-60 Min-No Report  Result Date: 05/25/2022 Fluoroscopy was utilized by the requesting physician.  No radiographic interpretation.     Treatments: surgery: LUMBAR FOUR-FIVE POSTERIOR LUMBAR INTERBODY FUSION, Expandable Stryker titanium cages filled with autograft morsels, as well as the disc space Laminectomy L4 beyond the needed exposure for the PLIF Non segmental pedicle screw fixation Nuvasive screws L4, L5  Discharge Exam: Blood pressure 123/70, pulse (!) 103, temperature 98.5 F (36.9 C), temperature source Oral, resp. rate 18, height 5\' 10"  (1.778 m), weight (!) 154.2 kg, SpO2 97 %.  Alert and oriented x 4 PERRLA CN II-XII grossly intact MAE, Strength and sensation intact Incision is covered with Honeycomb dressing and Steri Strips; Dressing is clean, dry, and intact   Disposition: Discharge disposition: 01-Home or Self Care       Discharge Instructions     Face-to-face encounter (required for Medicare/Medicaid patients)   Complete by: As directed    I Patricia Nettle certify that this patient is under my care and that I, or a nurse practitioner or physician's assistant working with me, had a face-to-face encounter that meets the physician face-to-face encounter requirements with this patient on 05/26/2022. The encounter with the patient was in whole, or in part for the following medical condition(s) which is the primary reason for home health care (List medical condition): Lumbar synovial  cyst   The encounter with the patient was in whole, or in  part, for the following medical condition, which is the primary reason for home health care: Lumbar synovial cyst   I certify that, based on my findings, the following services are medically necessary home health services: Physical therapy   Reason for Medically Necessary Home Health Services: Therapy- Therapeutic Exercises to Increase Strength and Endurance   My clinical findings support the need for the above services: Unable to leave home safely without assistance and/or assistive device   Further, I certify that my clinical findings support that this patient is homebound due to: Unable to leave home safely without assistance   Home Health   Complete by: As directed    To provide the following care/treatments:  PT OT        Allergies as of 05/26/2022       Reactions   Aspirin Hives, Rash   Lactose Intolerance (gi) Other (See Comments)   Upset stomach / constipation    Iodine Nausea Only   Other Itching, Other (See Comments)   Apples,oranges, grapes (skin on outside)-  throat itches        Medication List     STOP taking these medications    diazepam 5 MG tablet Commonly known as: VALIUM   diclofenac 75 MG EC tablet Commonly known as: VOLTAREN   oxyCODONE 10 mg 12 hr tablet Commonly known as: OXYCONTIN   oxyCODONE 5 MG immediate release tablet Commonly known as: Oxy IR/ROXICODONE       TAKE these medications    Alka-Seltzer Gold [id number] MG Tbef Generic drug: Sod Bicarb-K Bicarb-Citric Acd Take 2 tablets by mouth daily as needed (indigestion).   gabapentin 600 MG tablet Commonly known as: NEURONTIN Take 1 tablet (600 mg total) by mouth 2 (two) times daily. What changed: when to take this   loratadine 10 MG tablet Commonly known as: CLARITIN Take 10 mg by mouth daily as needed for allergies.               Durable Medical Equipment  (From admission, onward)           Start     Ordered   05/26/22 1234  For home use only DME Tub bench   Once        05/26/22 1234   05/26/22 1142  For home use only DME Walker rolling  Once       Comments: Bariatric needed due to body habitus Weight 340 pounds  Question Answer Comment  Walker: With [place]   Patient needs a walker to treat with the following condition Other abnormalities of gait and mobility      05/26/22 1142            Follow-up Information     [facility] [place] Follow up.   Contact information: [place] [address] [phone]                 Signed: Viona Gilmore, DNP, AGNP-C Nurse Practitioner  [facility] [address], [place], [place], [place] [address] P: [phone]    F: [phone]  05/26/2022, 1:49 PM

## 2022-05-26 NOTE — Discharge Instructions (Signed)
Wound Care Keep incision covered and dry for two days.    Do not put any creams, lotions, or ointments on incision. Leave steri-strips on back.  They will fall off by themselves. You are fine to shower. Let water run over incision and pat dry.  Activity Walk each and every day, increasing distance each day. No lifting greater than 5 lbs.  Avoid excessive back motion. No driving for 2 weeks; may ride as a passenger locally.  Diet Resume your normal diet.   Return to Work Will be discussed at your follow up appointment.  Call Your Doctor If Any of These Occur Redness, drainage, or swelling at the wound.  Temperature greater than 101 degrees. Severe pain not relieved by pain medication. Incision starts to come apart.  Follow Up Appt Call [phone] today for appointment in 2-3 weeks if you don't already have one or for any problems.  If you have any hardware placed in your spine, you will need an x-ray before your appointment.  

## 2022-05-26 NOTE — TOC Transition Note (Signed)
Transition of Care [facility]) - CM/SW Discharge Note   Patient Details  Name: Travis Fitzpatrick MRN: [id number] Date of Birth: September 22, 1980  Transition of Care [facility]) CM/SW Contact:  Carles Collet, RN Phone Number: 05/26/2022, 11:56 AM   Clinical Narrative:     Damaris Schooner w patient at bedside. He is agreeable to [facility] services, no preference for provider.  Referral accepted by [facility]. Patient wishes to order tub bench from [facility], has 3/1, I ordered bari RW through [employer] to be delivered to room today. Anticipate DC today.    Final next level of care: [place] Barriers to Discharge: No Barriers Identified   Patient Goals and CMS Choice Patient states their goals for this hospitalization and ongoing recovery are:: to go home CMS Medicare.gov Compare Post Acute Care list provided to:: Patient Choice offered to / list presented to : Patient  Discharge Placement                       Discharge Plan and Services                DME Arranged: Walker rolling DME Agency: [employer] Date DME Agency Contacted: 05/26/22 Time DME Agency Contacted: 1156 Representative spoke with at DME Agency: [place]: PT, OT [place] Agency: [place] Date [place]: 05/26/22 Time [place]: 1156 Representative spoke with at [place]: [place] Determinants of Health ([place]) Interventions     Readmission Risk Interventions     No data to display

## 2022-05-26 NOTE — Anesthesia Postprocedure Evaluation (Signed)
Anesthesia Post Note  Patient: Travis Fitzpatrick  Procedure(s) Performed: LUMBAR FOUR-FIVE POSTERIOR LUMBAR INTERBODY FUSION     Patient location during evaluation: PACU Anesthesia Type: General Level of consciousness: awake and alert Pain management: pain level controlled Vital Signs Assessment: post-procedure vital signs reviewed and stable Respiratory status: spontaneous breathing, nonlabored ventilation, respiratory function stable and patient connected to nasal cannula oxygen Cardiovascular status: blood pressure returned to baseline and stable Postop Assessment: no apparent nausea or vomiting Anesthetic complications: no   No notable events documented.  Last Vitals:  Vitals:   05/26/22 0719 05/26/22 1107  BP: 122/67 123/70  Pulse: 80 (!) 103  Resp: 17 18  Temp: 36.7 C 36.9 C  SpO2: 100% 97%    Last Pain:  Vitals:   05/26/22 1107  TempSrc: Oral  PainSc:                  March Rummage Kamori Barbier

## 2022-05-26 NOTE — Evaluation (Signed)
Physical Therapy Evaluation and Discharge Patient Details Name: Travis Fitzpatrick MRN: [id number] DOB: March 29, 1981 Today's Date: 05/26/2022  History of Present Illness  Presented to neurosurgery 05/25/22 after fall at PT due to RLE weakness. 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 All education has been completed and the patient has no further questions. Patient able to complete stair training with +2 minguard assist (+2 for safety due to recent fall).  See below for any follow-up Physical Therapy or equipment needs. PT is signing off. Thank you for this referral.        Recommendations for follow up therapy are one component of a multi-disciplinary discharge planning process, led by the attending physician.  Recommendations may be updated based on patient status, additional functional criteria and insurance authorization.  Follow Up Recommendations Home health PT      Assistance Recommended at Discharge Intermittent Supervision/Assistance  Patient can return home with the following  Assistance with cooking/housework;Help with stairs or ramp for entrance    Equipment Recommendations Rolling walker (2 wheels)  Recommendations for Other Services       Functional Status Assessment Patient has had a recent decline in their functional status and demonstrates the ability to make significant improvements in function in a reasonable and predictable amount of time.     Precautions / Restrictions Precautions Precautions: Fall;Back Precaution Booklet Issued: Yes (comment) Precaution Comments: by end of session pt able to state 3/3 precautions; no cues needed during session Required Braces or Orthoses:  (no brace)       Mobility  Bed Mobility Overal bed mobility: Needs Assistance Bed Mobility: Rolling, Sidelying to Sit Rolling: Supervision Sidelying to sit: Supervision       General bed mobility comments: vc for technique to prevent twisting; pt using rail as he has one on bed at home    Transfers Overall transfer level: Needs assistance Equipment used: Rolling walker (2 wheels) Transfers: Sit to/from Stand Sit to Stand: Min guard           General transfer comment: vc for hand placement with RW    Ambulation/Gait Ambulation/Gait assistance: Min guard, +2 safety/equipment Gait Distance (Feet): 40 Feet (10x2) Assistive device: Rolling walker (2 wheels) Gait Pattern/deviations: Step-to pattern, Decreased step length - left, Decreased stance time - right       General Gait Details: pt with h/o rt knee buckling; educated in step-to pattern for safety  Stairs Stairs: Yes Stairs assistance: Min assist, +2 safety/equipment Stair Management: One rail Left, Step to pattern, Forwards Number of Stairs: 2 General stair comments: insturctional cues for sequencing; pt able to recall and demonstrate; +2 for safety due to h/o recent fall  Wheelchair Mobility    Modified Rankin (Stroke Patients Only)       Balance Overall balance assessment: History of Falls                                           Pertinent Vitals/Pain Pain Assessment Pain Assessment: 0-10 Pain Score: 2  Pain Location: back Pain Descriptors / Indicators: Operative site guarding    Home Living Family/patient expects to be discharged to:: Private residence Living Arrangements: Spouse/significant other;Children (  2yo) Available Help at Discharge: Family Type of Home: House Home Access: Stairs to enter Entrance Stairs-Rails: Left Entrance Stairs-Number of Steps: 2   Home Layout: One level Home Equipment: Rollator (4 wheels);Cane - single point;BSC/3in1;Adaptive equipment      Prior Function  Prior Level of Function : Independent/Modified Independent             Mobility Comments: using rollator when out in community and cane when inside ADLs Comments: using AE for ADLs     Hand Dominance   Dominant Hand: Right    Extremity/Trunk Assessment   Upper Extremity Assessment Upper Extremity Assessment: Defer to OT evaluation    Lower Extremity Assessment Lower Extremity Assessment: RLE deficits/detail RLE Deficits / Details: quad weakness 3-/5, ankle DF 5/5 RLE Sensation: WNL RLE Coordination: WNL    Cervical / Trunk Assessment Cervical / Trunk Assessment: Back Surgery  Communication   Communication: No difficulties  Cognition Arousal/Alertness: Awake/alert Behavior During Therapy: WFL for tasks assessed/performed Overall Cognitive Status: Within Functional Limits for tasks assessed                                          General Comments General comments (skin integrity, edema, etc.): Wife present    Exercises     Assessment/Plan    PT Assessment All further PT needs can be met in the next venue of care  PT Problem List Decreased strength;Decreased balance;Decreased mobility;Decreased knowledge of use of DME;Decreased knowledge of precautions;Obesity;Pain       PT Treatment Interventions      PT Goals (Current goals can be found in the Care Plan section)  Acute Rehab PT Goals Patient Stated Goal: go home today PT Goal Formulation: All assessment and education complete, DC therapy    Frequency       Co-evaluation               AM-PAC PT "6 Clicks" Mobility  Outcome Measure Help needed turning from your back to your side while in a flat bed without using bedrails?: A Little Help needed moving from lying on your back to sitting on the side of a flat bed without using bedrails?: A Little Help needed moving to and from a bed to a chair (including a wheelchair)?: A Little Help needed standing up from a chair using your arms  (e.g., wheelchair or bedside chair)?: A Little Help needed to walk in hospital room?: A Little Help needed climbing 3-5 steps with a railing? : A Little 6 Click Score: 18    End of Session Equipment Utilized During Treatment: Gait belt Activity Tolerance: Patient tolerated treatment well Patient left: in chair;with call bell/phone within reach;with chair alarm set;with family/visitor present Nurse Communication: Mobility status;Other (comment) (needs HHPT, HHOT, RW) PT Visit Diagnosis: Difficulty in walking, not elsewhere classified (R26.2)    Time: 1561-5379 PT Time Calculation (min) (ACUTE ONLY): 51 min   Charges:   PT Evaluation $PT Eval Low Complexity: 1 Low PT Treatments $Gait Training: 8-22 mins         Arby Barrette, PT Acute Rehabilitation Services  Office [phone]   Rexanne Mano 05/26/2022, 10:30 AM

## 2022-05-26 NOTE — Progress Notes (Signed)
Discharge instructions have been printed and reviewed with patient.  Patient verbalizes understanding and has no questions.  Peripheral iv has been removed.

## 2022-05-26 NOTE — Care Management Obs Status (Signed)
[place] NOTIFICATION   Patient Details  Name: Travis Fitzpatrick MRN: [id number] Date of Birth: January 12, 1981   Medicare Observation Status Notification Given:  Yes    Carles Collet, RN 05/26/2022, 10:32 AM

## 2022-05-26 NOTE — Care Management CC44 (Signed)
Condition Code 44 Documentation Completed  Patient Details  Name: AT[place] CROS[place] MRN: [id number] Date of Birth: 14-Jun-1981   Condition Code 44 given:  Yes Patient signature on Condition Code 44 notice:  Yes Documentation of 2 MD's agreement:  Yes Code 44 added to claim:  Yes    Carles Collet, RN 05/26/2022, 10:32 AM

## 2022-05-26 NOTE — Progress Notes (Signed)
Occupational Therapy Evaluation Patient Details Name: Travis Fitzpatrick MRN: [id number] DOB: [birth year] Today's Date: 05/26/2022   History of Present Illness Presented to neurosurgery 05/25/22 after fall at PT due to RLE weakness. 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 Upon eval, pt performing UB ADL with set-up and LB ADL with min guard A. Pt requiring mod A +2 for tub/shower transfer with BSC for seated shower due to RLE instability, decreased balance, and decreased mobility/strength in LE. Highly recommending tub transfer bench for safer transfers to decrease need to support weight on unilateral UE and to prevent fall in the home setting as pt with recent fall PTA due to knee buckling and requiring only min guard-min A with tub bench. Pt educated and demonstrating use of compensatory techniques for bed mobility, LB dressing, grooming, and shower transfer within precautions. Recommending HHOT to optimize safety and independence in the home setting.      Recommendations for follow up therapy are one component of a multi-disciplinary discharge planning process, led by the attending physician.  Recommendations may be updated based on patient status, additional functional criteria and insurance authorization.   Follow Up Recommendations  Home health OT    Assistance Recommended at Discharge Intermittent Supervision/Assistance  Patient can return home with the following A little help with walking and/or transfers;A little help with bathing/dressing/bathroom;Assistance with cooking/housework;Assist for transportation;Help with stairs or ramp for entrance    Functional Status Assessment  Patient has had a recent decline in their functional status  and demonstrates the ability to make significant improvements in function in a reasonable and predictable amount of time.  Equipment Recommendations  Tub/shower bench    Recommendations for Other Services       Precautions / Restrictions Precautions Precautions: Fall;Back Precaution Booklet Issued: Yes (comment) Precaution Comments: by end of session pt able to state 3/3 precautions; no cues needed during session Required Braces or Orthoses:  (no brace needed orders) Restrictions Weight Bearing Restrictions: No      Mobility Bed Mobility Overal bed mobility: Needs Assistance Bed Mobility: Rolling, Sidelying to Sit Rolling: Supervision Sidelying to sit: Supervision       General bed mobility comments: vc for technique to prevent twisting; pt using rail as he has one on bed at home    Transfers Overall transfer level: Needs assistance Equipment used: Rolling walker (2 wheels) Transfers: Sit to/from Stand Sit to Stand: Min guard           General transfer comment: vc for hand placement with RW      Balance Overall balance assessment: History of Falls                                         ADL either performed or assessed with clinical judgement   ADL Overall ADL's : Needs assistance/impaired Eating/Feeding: Modified independent   Grooming: Oral care;Min guard;Standing Grooming Details (indicate cue type and reason): educated regarding compensatory techniques. Upper Body Bathing: Set up;Sitting   Lower Body Bathing: Sitting/lateral leans;Min guard Lower Body Bathing Details (indicate cue type and reason): has long handled sponge Upper Body Dressing : Set up;Sitting   Lower Body  Dressing: Min guard;Sit to/from stand;With adaptive equipment Lower Body Dressing Details (indicate cue type and reason): Has all needed LB AE Toilet Transfer: Min guard;Rolling walker (2 wheels);Ambulation;Comfort height toilet Toilet Transfer Details (indicate cue  type and reason): Min guard A for safety Toileting- Clothing Manipulation and Hygiene: Min guard;Sit to/from stand;Adhering to back precautions Toileting - Clothing Manipulation Details (indicate cue type and reason): educated regarding AE Tub/ Shower Transfer: Moderate assistance;+2 for physical assistance;+2 for safety/equipment;Ambulation;Rolling walker (2 wheels);BSC/3in1 Tub/Shower Transfer Details (indicate cue type and reason): Pt performing tub/shower transfer with mod A +2 and taking steps backward to [facility] in shower. Pt very unsafe without +2 A during shower transfer. Greater safety with tub bench. Would highly recommed tub bench to avoid requirement to bring LLE over side of tub as Mod A for balance, knee flexion, and to block RLE from buckling was required for pt to safely transfer into tub. Mod +2 to exit tub as well. Pt able to perform tub/bench transfer with min A Functional mobility during ADLs: Min guard;Rolling walker (2 wheels) General ADL Comments: All education provided     Vision Baseline Vision/History: 0 No visual deficits Ability to See in Adequate Light: 0 Adequate Patient Visual Report: No change from baseline Vision Assessment?: No apparent visual deficits Additional Comments: WFL for tasks assessed     Perception     Praxis      Pertinent Vitals/Pain Pain Assessment Pain Assessment: 0-10 Pain Score: 2  Pain Location: back Pain Descriptors / Indicators: Operative site guarding Pain Intervention(s): Limited activity within patient's tolerance     Hand Dominance Right   Extremity/Trunk Assessment Upper Extremity Assessment Upper Extremity Assessment: Overall WFL for tasks assessed   Lower Extremity Assessment Lower Extremity Assessment: Defer to PT evaluation RLE Deficits / Details: quad weakness 3-/5, ankle DF 5/5 RLE Sensation: WNL RLE Coordination: WNL   Cervical / Trunk Assessment Cervical / Trunk Assessment: Back Surgery   Communication  Communication Communication: No difficulties   Cognition Arousal/Alertness: Awake/alert Behavior During Therapy: WFL for tasks assessed/performed Overall Cognitive Status: Within Functional Limits for tasks assessed                                 General Comments: Asking collaborative questions with insight into home setting     General Comments  wife present    Exercises     Shoulder Instructions      Home Living Family/patient expects to be discharged to:: Private residence Living Arrangements: Spouse/significant other;Children (2 yo) Available Help at Discharge: Family Type of Home: House Home Access: Stairs to enter Secretary/administrator of Steps: 2 Entrance Stairs-Rails: Left Home Layout: One level     Bathroom Shower/Tub: Chief Strategy Officer: Standard Bathroom Accessibility: Yes How Accessible: Accessible via walker Home Equipment: Rollator (4 wheels);Cane - single point;BSC/3in1;Adaptive equipment Adaptive Equipment: Reacher;Sock aid;Long-handled sponge;Long-handled shoe horn        Prior Functioning/Environment Prior Level of Function : Independent/Modified Independent             Mobility Comments: using rollator when out in community and cane when inside ADLs Comments: using AE for ADLs        OT Problem List: Decreased strength;Decreased activity tolerance;Impaired balance (sitting and/or standing);Decreased knowledge of use of DME or AE;Decreased knowledge of precautions;Pain      OT Treatment/Interventions: Self-care/ADL training;Therapeutic exercise;Patient/family education;Balance training;Therapeutic activities;DME and/or AE instruction    OT Goals(Current  goals can be found in the care plan section) Acute Rehab OT Goals Patient Stated Goal: go home safely OT Goal Formulation: With patient Time For Goal Achievement: 06/09/22 Potential to Achieve Goals: Good  OT Frequency: Min 2X/week    Co-evaluation               AM-PAC OT "6 Clicks" Daily Activity     Outcome Measure Help from another person eating meals?: None Help from another person taking care of personal grooming?: A Little Help from another person toileting, which includes using toliet, bedpan, or urinal?: A Little Help from another person bathing (including washing, rinsing, drying)?: A Little Help from another person to put on and taking off regular upper body clothing?: A Little Help from another person to put on and taking off regular lower body clothing?: A Little 6 Click Score: 19   End of Session Equipment Utilized During Treatment: Gait belt;Rolling walker (2 wheels) Nurse Communication: Mobility status  Activity Tolerance: Patient tolerated treatment well Patient left: in chair;with call bell/phone within reach;with chair alarm set;with family/visitor present  OT Visit Diagnosis: Unsteadiness on feet (R26.81);Pain;History of falling (Z91.81);Muscle weakness (generalized) (M62.81) Pain - Right/Left: Right Pain - part of body:  (operative site, RLE)                Time: 3903-0092 OT Time Calculation (min): 50 min Charges:  OT General Charges $OT Visit: 1 Visit OT Evaluation $OT Eval Low Complexity: 1 Low  Ladene Artist, OTR/L [facility] Acute Rehabilitation Office: [phone]   Drue Novel 05/26/2022, 12:27 PM

## 2022-05-26 NOTE — Progress Notes (Signed)
   Providing Compassionate, Quality Care - Together   Subjective: Patient reports he is feeling better this morning. He would like to work with therapies prior to deciding on going home later today.  Objective: Vital signs in last 24 hours: Temp:  [97.7 F (36.5 C)-98.9 F (37.2 C)] 98 F (36.7 C) (10/14 0719) Pulse Rate:  [78-112] 80 (10/14 0719) Resp:  [14-20] 17 (10/14 0719) BP: (120-153)/(57-96) 122/67 (10/14 0719) SpO2:  [94 %-100 %] 100 % (10/14 0719)  Intake/Output from previous day: 10/13 0701 - 10/14 0700 In: 1840 [P.O.:240; I.V.:1500; IV Piggyback:100] Out: 1800 [Urine:1550; Blood:250] Intake/Output this shift: No intake/output data recorded.  Alert and oriented x 4 PERRLA CN II-XII grossly intact MAE, Strength and sensation intact Incision is covered with Honeycomb dressing and Steri Strips; Dressing is intact; there is a moderate amount of sanguinous drainage   Lab Results: Recent Labs    05/25/22 1949  WBC 8.6  HGB 12.4*  HCT 38.9*  PLT 267   BMET Recent Labs    05/25/22 1949  CREATININE 1.21    Studies/Results: DG Lumbar Spine 1 View  Result Date: 05/25/2022 CLINICAL DATA:  L4-5 posterior fusion.  Intraoperative fluoroscopy. EXAM: LUMBAR SPINE - 1 VIEW COMPARISON:  MRI lumbar spine 04/26/2022 FINDINGS: Images were performed intraoperatively without the presence of a radiologist. Single frontal view of the lumbar spine. There are overlying retractors. The patient is undergoing placement of an intervertebral disc spacer. Transpedicular screws are seen at the approximate L4 and L5 levels. Total fluoroscopy images: 1 Total fluoroscopy time: 77 seconds Total dose: Radiation Exposure Index (as provided by the fluoroscopic device): 115 mGy air Kerma Please see intraoperative findings for further detail. IMPRESSION: Intraoperative fluoroscopy provided during L4-5 posterior fusion. Electronically Signed   By: Yvonne Kendall M.D.   On: 05/25/2022 15:32   DG  C-Arm 1-60 Min-No Report  Result Date: 05/25/2022 Fluoroscopy was utilized by the requesting physician.  No radiographic interpretation.   DG C-Arm 1-60 Min-No Report  Result Date: 05/25/2022 Fluoroscopy was utilized by the requesting physician.  No radiographic interpretation.   DG C-Arm 1-60 Min-No Report  Result Date: 05/25/2022 Fluoroscopy was utilized by the requesting physician.  No radiographic interpretation.   DG C-Arm 1-60 Min-No Report  Result Date: 05/25/2022 Fluoroscopy was utilized by the requesting physician.  No radiographic interpretation.    Assessment/Plan: Patient underwent an L4-5 PLIF with Dr. Christella Noa on 05/25/2022. His postoperative course has been uncomplicated thus far.   LOS: 1 day   -Change Honeycomb dressing when patient gets up with therapies. -Patient will decide if he would like to go home after working with therapies.   Viona Gilmore, DNP, AGNP-C Nurse Practitioner  [facility] [place] [address], [address], [place], [place] [address] P: [phone]    F: [phone]  05/26/2022, 9:24 AM

## 2022-05-28 ENCOUNTER — Telehealth: Payer: Self-pay | Admitting: Physical Therapy

## 2022-05-28 ENCOUNTER — Ambulatory Visit: Payer: Medicare Other | Admitting: Physical Therapy

## 2022-05-28 NOTE — Telephone Encounter (Signed)
Called patient to check on him after surgery and to coordinate scheduling for OP PT. Patient states he was told at discharge that home health PT would be calling him to set up visits and that he was supposed to continue with OPPT. I explained to him that insurance only covers one setting at a time. He decided to continue with HHPT at this time. I also let him know that we need a new PT order to continue OPPT. He said he understood.  He agreed to cancel, OPPT appointments scheduled for this week.   Everlean Alstrom. Graylon Good, PT, DPT 05/28/22, 12:17 PM  [facility] [address] [place], [place] [address] P: [phone] I F: [phone]   Called Dr. Pete Pelt office [facility] at [phone]. Spoke with Caryl Pina who transferred me to VM of a CMA (did not remember name). Left VM explaining situation and requesting PT to be faxed to [place] with information on precautions and when to start OP PT.   Everlean Alstrom. Graylon Good, PT, DPT 05/28/22, 12:18 PM  [facility] [address] [place], [place] [address] P: [phone] I F: [phone]

## 2022-05-30 ENCOUNTER — Ambulatory Visit: Payer: Medicare Other | Admitting: Physical Therapy

## 2022-06-04 ENCOUNTER — Ambulatory Visit: Payer: Medicare Other | Admitting: Physical Therapy

## 2022-06-04 ENCOUNTER — Telehealth: Payer: Self-pay | Admitting: Physical Therapy

## 2022-06-04 NOTE — Telephone Encounter (Signed)
Called patient to check on him and discuss return to OPPT for scheduling purposes. Patient states HHPT started on Friday and they plan to see him until the end of the month. He has his post op follow up with Dr. Christella Noa on 10/30. He states his right LE is doing better but he is having trouble with L LE weakness and pain. Agreed to update [place] after his follow up with Dr. Christella Noa at the end of the month.   Plan to cancel all OPPT appointments until November.   Everlean Alstrom. Graylon Good, PT, DPT 06/04/22, 10:34 AM  [facility] [address] [place],  [address] P: [phone] I F: [phone]

## 2022-06-06 ENCOUNTER — Encounter: Payer: Medicare Other | Admitting: Physical Therapy

## 2022-06-11 ENCOUNTER — Encounter: Payer: Medicare Other | Admitting: Physical Therapy

## 2022-06-13 ENCOUNTER — Encounter: Payer: Medicare Other | Admitting: Physical Therapy

## 2022-06-18 ENCOUNTER — Encounter: Payer: Medicare Other | Admitting: Physical Therapy

## 2022-06-20 ENCOUNTER — Encounter: Payer: Medicare Other | Admitting: Physical Therapy

## 2022-06-25 ENCOUNTER — Encounter: Payer: Medicare Other | Admitting: Physical Therapy

## 2022-06-27 ENCOUNTER — Encounter: Payer: Medicare Other | Admitting: Physical Therapy

## 2022-07-02 ENCOUNTER — Encounter: Payer: Medicare Other | Admitting: Physical Therapy

## 2022-07-04 ENCOUNTER — Encounter: Payer: Medicare Other | Admitting: Physical Therapy

## 2022-07-06 ENCOUNTER — Other Ambulatory Visit: Payer: Self-pay | Admitting: Physical Medicine & Rehabilitation

## 2022-07-06 DIAGNOSIS — M4804 Spinal stenosis, thoracic region: Secondary | ICD-10-CM

## 2022-07-06 DIAGNOSIS — M5104 Intervertebral disc disorders with myelopathy, thoracic region: Secondary | ICD-10-CM

## 2022-07-06 DIAGNOSIS — M4714 Other spondylosis with myelopathy, thoracic region: Secondary | ICD-10-CM

## 2022-07-06 DIAGNOSIS — M47816 Spondylosis without myelopathy or radiculopathy, lumbar region: Secondary | ICD-10-CM

## 2022-07-09 ENCOUNTER — Encounter: Payer: Medicare Other | Admitting: Physical Therapy

## 2022-07-11 ENCOUNTER — Encounter: Payer: Medicare Other | Admitting: Physical Therapy

## 2022-07-11 ENCOUNTER — Other Ambulatory Visit: Payer: Self-pay | Admitting: Physical Medicine & Rehabilitation

## 2022-07-11 DIAGNOSIS — M5104 Intervertebral disc disorders with myelopathy, thoracic region: Secondary | ICD-10-CM

## 2022-07-11 DIAGNOSIS — M47816 Spondylosis without myelopathy or radiculopathy, lumbar region: Secondary | ICD-10-CM

## 2022-07-11 DIAGNOSIS — M4714 Other spondylosis with myelopathy, thoracic region: Secondary | ICD-10-CM

## 2022-07-11 DIAGNOSIS — M4804 Spinal stenosis, thoracic region: Secondary | ICD-10-CM

## 2022-08-08 ENCOUNTER — Other Ambulatory Visit: Payer: Self-pay | Admitting: Physical Medicine & Rehabilitation

## 2022-08-08 DIAGNOSIS — M5104 Intervertebral disc disorders with myelopathy, thoracic region: Secondary | ICD-10-CM

## 2022-08-08 DIAGNOSIS — M4714 Other spondylosis with myelopathy, thoracic region: Secondary | ICD-10-CM

## 2022-08-08 DIAGNOSIS — M4804 Spinal stenosis, thoracic region: Secondary | ICD-10-CM

## 2022-08-08 DIAGNOSIS — M47816 Spondylosis without myelopathy or radiculopathy, lumbar region: Secondary | ICD-10-CM

## 2022-09-05 ENCOUNTER — Other Ambulatory Visit: Payer: Self-pay | Admitting: Physical Medicine & Rehabilitation

## 2022-09-05 DIAGNOSIS — M4804 Spinal stenosis, thoracic region: Secondary | ICD-10-CM

## 2022-09-05 DIAGNOSIS — M5104 Intervertebral disc disorders with myelopathy, thoracic region: Secondary | ICD-10-CM

## 2022-09-05 DIAGNOSIS — M47816 Spondylosis without myelopathy or radiculopathy, lumbar region: Secondary | ICD-10-CM

## 2022-09-05 DIAGNOSIS — M4714 Other spondylosis with myelopathy, thoracic region: Secondary | ICD-10-CM

## 2022-10-09 ENCOUNTER — Other Ambulatory Visit: Payer: Self-pay | Admitting: Physical Medicine & Rehabilitation

## 2022-10-09 DIAGNOSIS — M5104 Intervertebral disc disorders with myelopathy, thoracic region: Secondary | ICD-10-CM

## 2022-10-09 DIAGNOSIS — M47816 Spondylosis without myelopathy or radiculopathy, lumbar region: Secondary | ICD-10-CM

## 2022-10-09 DIAGNOSIS — M4804 Spinal stenosis, thoracic region: Secondary | ICD-10-CM

## 2022-10-09 DIAGNOSIS — M4714 Other spondylosis with myelopathy, thoracic region: Secondary | ICD-10-CM

## 2022-10-23 ENCOUNTER — Other Ambulatory Visit: Payer: Self-pay | Admitting: Neurosurgery

## 2022-10-23 DIAGNOSIS — M4316 Spondylolisthesis, lumbar region: Secondary | ICD-10-CM

## 2022-10-31 ENCOUNTER — Ambulatory Visit
Admission: RE | Admit: 2022-10-31 | Discharge: 2022-10-31 | Disposition: A | Discharge status: Home / Self Care | Payer: Medicare Other | Source: Ambulatory Visit | Attending: Neurosurgery | Admitting: Neurosurgery

## 2022-10-31 DIAGNOSIS — M4316 Spondylolisthesis, lumbar region: Secondary | ICD-10-CM

## 2022-10-31 MED ORDER — GADOBUTROL 1 MMOL/ML IV SOLN
10.0000 mL | Freq: Once | INTRAVENOUS | Status: AC | PRN
  Administered 2022-10-31: 10 mL via INTRAVENOUS

## 2022-11-04 ENCOUNTER — Other Ambulatory Visit: Payer: Self-pay | Admitting: Physical Medicine & Rehabilitation

## 2022-11-04 DIAGNOSIS — M4804 Spinal stenosis, thoracic region: Secondary | ICD-10-CM

## 2022-11-04 DIAGNOSIS — M47816 Spondylosis without myelopathy or radiculopathy, lumbar region: Secondary | ICD-10-CM

## 2022-11-04 DIAGNOSIS — M5104 Intervertebral disc disorders with myelopathy, thoracic region: Secondary | ICD-10-CM

## 2022-11-04 DIAGNOSIS — M4714 Other spondylosis with myelopathy, thoracic region: Secondary | ICD-10-CM

## 2022-11-05 ENCOUNTER — Other Ambulatory Visit: Payer: Self-pay | Admitting: Physical Medicine & Rehabilitation

## 2022-11-05 DIAGNOSIS — M47816 Spondylosis without myelopathy or radiculopathy, lumbar region: Secondary | ICD-10-CM

## 2022-11-05 DIAGNOSIS — M4804 Spinal stenosis, thoracic region: Secondary | ICD-10-CM

## 2022-11-05 DIAGNOSIS — M5104 Intervertebral disc disorders with myelopathy, thoracic region: Secondary | ICD-10-CM

## 2022-11-05 DIAGNOSIS — M4714 Other spondylosis with myelopathy, thoracic region: Secondary | ICD-10-CM

## 2022-12-03 ENCOUNTER — Other Ambulatory Visit: Payer: Self-pay | Admitting: Physical Medicine & Rehabilitation

## 2022-12-03 DIAGNOSIS — M4714 Other spondylosis with myelopathy, thoracic region: Secondary | ICD-10-CM

## 2022-12-03 DIAGNOSIS — M5104 Intervertebral disc disorders with myelopathy, thoracic region: Secondary | ICD-10-CM

## 2022-12-03 DIAGNOSIS — M47816 Spondylosis without myelopathy or radiculopathy, lumbar region: Secondary | ICD-10-CM

## 2022-12-03 DIAGNOSIS — M4804 Spinal stenosis, thoracic region: Secondary | ICD-10-CM

## 2022-12-05 ENCOUNTER — Encounter: Payer: Medicare Other | Attending: Physical Medicine & Rehabilitation | Admitting: Physical Medicine & Rehabilitation

## 2022-12-05 ENCOUNTER — Encounter: Payer: Self-pay | Admitting: Physical Medicine & Rehabilitation

## 2022-12-05 VITALS — BP 132/79 | HR 103 | Ht 70.0 in | Wt 352.0 lb

## 2022-12-05 DIAGNOSIS — M47816 Spondylosis without myelopathy or radiculopathy, lumbar region: Secondary | ICD-10-CM | POA: Insufficient documentation

## 2022-12-05 DIAGNOSIS — M4714 Other spondylosis with myelopathy, thoracic region: Secondary | ICD-10-CM | POA: Diagnosis not present

## 2022-12-05 DIAGNOSIS — G039 Meningitis, unspecified: Secondary | ICD-10-CM | POA: Diagnosis present

## 2022-12-05 DIAGNOSIS — M4804 Spinal stenosis, thoracic region: Secondary | ICD-10-CM | POA: Insufficient documentation

## 2022-12-05 DIAGNOSIS — M792 Neuralgia and neuritis, unspecified: Secondary | ICD-10-CM | POA: Insufficient documentation

## 2022-12-05 DIAGNOSIS — M5104 Intervertebral disc disorders with myelopathy, thoracic region: Secondary | ICD-10-CM | POA: Insufficient documentation

## 2022-12-05 MED ORDER — GABAPENTIN 600 MG PO TABS: 600.0000 mg | ORAL_TABLET | Freq: Four times a day (QID) | ORAL | 3 refills | Status: DC

## 2022-12-05 MED ORDER — NORTRIPTYLINE HCL 10 MG PO CAPS: 10.0000 mg | ORAL_CAPSULE | Freq: Every day | ORAL | 2 refills | Status: DC

## 2022-12-05 NOTE — Progress Notes (Signed)
Subjective:    Patient ID: Travis Fitzpatrick, male    DOB: 11-04-1980, 42 y.o.   MRN: [id number]  HPI  Mr. Gingerich is here in follow up of thoracic myelopathy. He had a lumbar decompression and fusion (2 surgeries) by [facility] for back and right hip/leg pain. He hasn't had any improvement in pain although he improved initially. He says he was able to walk for the first 6 weeks then began to have more pain. He struggles to walk and really can't drive now. Right hip hurts most with wb. For pain he's taking gabapentin  tid and diclofenac bid.    MRI from March:  1. Lumbar spondylosis, degenerative disc disease, and congenitally short pedicles contributing to moderate impingement at L3-4 and L5-S1. 2. Postoperative findings at L4-5, without recurrent synovial cyst or impingement. 3. Clumping of nerve roots centrally at the L4 level is probably due to the central narrowing of the thecal sac at L3-4 rather than arachnoiditis.  Pt states he wasn't told about the arachnoiditis by NS  He sometimes has some problems with emptying bladder. He has a urology appt pending. He had been voiding fairly well the last time I saw him.   Pain Inventory Average Pain 8 Pain Right Now 8 My pain is sharp, burning, stabbing, tingling, and aching  In the last 24 hours, has pain interfered with the following? General activity 10 Relation with others 10 Enjoyment of life 10 What TIME of day is your pain at its worst? daytime and night Sleep (in general) Poor  Pain is worse with: walking, sitting, inactivity, and standing Pain improves with:  unsure Relief from Meds: 0  Family History  Problem Relation Age of Onset   Hypertension Father    Social History   Socioeconomic History   Marital status: Single    Spouse name: Not on file   Number of children: Not on file   Years of education: Not on file   Highest education level: Not on file  Occupational History   Not on file  Tobacco Use    Smoking status: Never   Smokeless tobacco: Never  Vaping Use   Vaping Use: Never used  Substance and Sexual Activity   Alcohol use: No    Alcohol/week: 0.0 standard drinks of alcohol   Drug use: No   Sexual activity: Never  Other Topics Concern   Not on file  Social History Narrative   Not on file   Social Determinants of Health   Financial Resource Strain: Not on file  Food Insecurity: Not on file  Transportation Needs: Not on file  Physical Activity: Not on file  Stress: Not on file  Social Connections: Not on file   Past Surgical History:  Procedure Laterality Date   BACK SURGERY  2010   CIRCUMCISION     LUMBAR LAMINECTOMY/DECOMPRESSION MICRODISCECTOMY  07/20/2011   Procedure: LUMBAR LAMINECTOMY/DECOMPRESSION MICRODISCECTOMY;  Surgeon: Carmela Hurt;  Location: [facility];  Service: Neurosurgery;  Laterality: N/A;  right thoracotomy with thoracic eight-nine discectomy and fusion   LUMBAR LAMINECTOMY/DECOMPRESSION MICRODISCECTOMY Right 10/06/2021   Procedure: Right Lumbar Four-Five Microdiscectomy, Right Lumbar Five-Sacal One Foraminotomy;  Surgeon: Coletta Memos, MD;  Location: [facility];  Service: Neurosurgery;  Laterality: Right;  3C/RM 21   THORACIC DISCECTOMY  07/16/2012   Procedure: THORACIC DISCECTOMY;  Surgeon: Carmela Hurt, MD;  Location: [facility];  Service: Neurosurgery;  Laterality: Right;  RIGHT Thoracic seven-eight  thoracic diskectomy via thoracotomy by dr Laneta Simmers  THORACIC DISCECTOMY N/A 12/15/2014   Procedure: THORACIC SEVEN TO THORACIC NINE Laminectomy ;  Surgeon: Coletta Memos, MD;  Location: [facility];  Service: Neurosurgery;  Laterality: N/A;   THORACIC DISCECTOMY N/A 06/06/2017   Procedure: LAMINECTOMY THORACIC NINE-TEN;  Surgeon: Coletta Memos, MD;  Location: [facility];  Service: Neurosurgery;  Laterality: N/A;  LAMINECTOMY THORACIC NINE-TEN   THORACOTOMY  07/20/2011   Procedure: THORACOTOMY OPEN FOR SPINE SURGERY;  Surgeon: Norton Blizzard, MD;  Location:  [facility];  Service: Vascular;  Laterality: N/A;   THORACOTOMY  07/16/2012   Procedure: THORACOTOMY OPEN FOR SPINE SURGERY;  Surgeon: Alleen Borne, MD;  Location: [facility];  Service: Thoracic;  Laterality: N/A;   Past Surgical History:  Procedure Laterality Date   BACK SURGERY  2010   CIRCUMCISION     LUMBAR LAMINECTOMY/DECOMPRESSION MICRODISCECTOMY  07/20/2011   Procedure: LUMBAR LAMINECTOMY/DECOMPRESSION MICRODISCECTOMY;  Surgeon: Carmela Hurt;  Location: [facility];  Service: Neurosurgery;  Laterality: N/A;  right thoracotomy with thoracic eight-nine discectomy and fusion   LUMBAR LAMINECTOMY/DECOMPRESSION MICRODISCECTOMY Right 10/06/2021   Procedure: Right Lumbar Four-Five Microdiscectomy, Right Lumbar Five-Sacal One Foraminotomy;  Surgeon: Coletta Memos, MD;  Location: [facility];  Service: Neurosurgery;  Laterality: Right;  3C/RM 21   THORACIC DISCECTOMY  07/16/2012   Procedure: THORACIC DISCECTOMY;  Surgeon: Carmela Hurt, MD;  Location: [facility];  Service: Neurosurgery;  Laterality: Right;  RIGHT Thoracic seven-eight  thoracic diskectomy via thoracotomy by dr Laneta Simmers   THORACIC DISCECTOMY N/A 12/15/2014   Procedure: THORACIC SEVEN TO THORACIC NINE Laminectomy ;  Surgeon: Coletta Memos, MD;  Location: [facility];  Service: Neurosurgery;  Laterality: N/A;   THORACIC DISCECTOMY N/A 06/06/2017   Procedure: LAMINECTOMY THORACIC NINE-TEN;  Surgeon: Coletta Memos, MD;  Location: [facility];  Service: Neurosurgery;  Laterality: N/A;  LAMINECTOMY THORACIC NINE-TEN   THORACOTOMY  07/20/2011   Procedure: THORACOTOMY OPEN FOR SPINE SURGERY;  Surgeon: Algis Downs Karle Plumber, MD;  Location: [facility];  Service: Vascular;  Laterality: N/A;   THORACOTOMY  07/16/2012   Procedure: THORACOTOMY OPEN FOR SPINE SURGERY;  Surgeon: Alleen Borne, MD;  Location: [facility];  Service: Thoracic;  Laterality: N/A;   Past Medical History:  Diagnosis Date   Bowel trouble    urgency   Medical history  non-contributory    Urinary urgency    BP 132/79   Pulse (!) 103   Ht 5\' 10"  (1.778 m)   Wt (!) 352 lb (159.7 kg)   SpO2 97%   BMI 50.51 kg/m   Opioid Risk Score:   Fall Risk Score:  `1  Depression screen [facility] 2/9     06/28/2021    9:29 AM 03/29/2021    9:39 AM 01/11/2021   10:19 AM  Depression screen PHQ 2/9  Decreased Interest 0 1 3  Down, Depressed, Hopeless 0 1 1  PHQ - 2 Score 0 2 4  Altered sleeping   3  Tired, decreased energy   3  Change in appetite   3  Feeling bad or failure about yourself    0  Trouble concentrating   2  Moving slowly or fidgety/restless   1  Suicidal thoughts   0  PHQ-9 Score   16      Review of Systems  Musculoskeletal:  Positive for back pain.       Leg groin pain  All other systems reviewed and are negative.     Objective:  Physical Exam General: No acute distress HEENT: NCAT, EOMI, oral membranes moist Cards: reg rate  Chest: normal effort Abdomen: Soft, NT, ND Skin: dry, intact Extremities: no edema Psych: pleasant and appropriate  Skin: intact Neuro: Cranial nerve exam intact.  Cognitively appropriate.  Patient with a mid thoracic sensory level approximately T8 and location.    Strength is actually 2/5 RLE 3-4/5 LLE Sensation decreased R>L LE. Marland Kitchen  Reflexes are trace.  There is no resting tone.     In standing he has some difficulties with balance and with change of directions due to his sensory deficits. Musculoskeletal: lpw back tender. Right hip + Patricks, pain with palpation.              Assessment & Plan:  1. Hx of thoracic myelopathy with residual sensory deficits from trunk down.  He has a T8 sensory level on exam 2.  Lumbar spondylosis/ddd s/p decompression and fusion  3.  Right hip pain concerning for OA 4. See no sign of neurogenic bowel or bladder. Pt does have back pain which he pushes to empty his bowels on toilet. 5. Morbid obesity 6. Adhesive arachnoiditis.    Plan: 1.  Increase gabapentin to 600 mg  qid.  Add nortriptyline for nerve pain  qhs 2.  RFf diclofenac 75 mg twice daily with food.   3.  xrays right hip 4.  outpt therapy to address LE strengthn rom, pain, etc 5. Bowels moving better with scheduled lax annd softnere. Less pain   22 minutes of face to face patient care time were spent during this visit. All questions were encouraged and answered. Follow up with me in 2 mos.

## 2022-12-05 NOTE — Patient Instructions (Signed)
ALWAYS FEEL FREE TO CALL OUR OFFICE WITH ANY PROBLEMS OR QUESTIONS ([phone])  **PLEASE NOTE** ALL MEDICATION REFILL REQUESTS (INCLUDING CONTROLLED SUBSTANCES) NEED TO BE MADE AT LEAST 7 DAYS PRIOR TO REFILL BEING DUE. ANY REFILL REQUESTS INSIDE THAT TIME FRAME MAY RESULT IN DELAYS IN RECEIVING YOUR PRESCRIPTION.                    

## 2022-12-06 ENCOUNTER — Ambulatory Visit
Admission: RE | Admit: 2022-12-06 | Discharge: 2022-12-06 | Disposition: A | Discharge status: Home / Self Care | Payer: Medicare Other | Source: Ambulatory Visit | Attending: Physical Medicine & Rehabilitation | Admitting: Physical Medicine & Rehabilitation

## 2022-12-06 DIAGNOSIS — M792 Neuralgia and neuritis, unspecified: Secondary | ICD-10-CM | POA: Diagnosis present

## 2022-12-06 DIAGNOSIS — G039 Meningitis, unspecified: Secondary | ICD-10-CM | POA: Diagnosis present

## 2022-12-06 DIAGNOSIS — M5104 Intervertebral disc disorders with myelopathy, thoracic region: Secondary | ICD-10-CM

## 2022-12-07 ENCOUNTER — Other Ambulatory Visit: Payer: Self-pay | Admitting: Physical Medicine & Rehabilitation

## 2022-12-07 DIAGNOSIS — M4714 Other spondylosis with myelopathy, thoracic region: Secondary | ICD-10-CM

## 2022-12-07 DIAGNOSIS — M5104 Intervertebral disc disorders with myelopathy, thoracic region: Secondary | ICD-10-CM

## 2022-12-07 DIAGNOSIS — M47816 Spondylosis without myelopathy or radiculopathy, lumbar region: Secondary | ICD-10-CM

## 2022-12-07 DIAGNOSIS — M4804 Spinal stenosis, thoracic region: Secondary | ICD-10-CM

## 2022-12-11 ENCOUNTER — Other Ambulatory Visit: Payer: Self-pay | Admitting: Physical Medicine & Rehabilitation

## 2022-12-11 DIAGNOSIS — M4714 Other spondylosis with myelopathy, thoracic region: Secondary | ICD-10-CM

## 2022-12-11 DIAGNOSIS — M5104 Intervertebral disc disorders with myelopathy, thoracic region: Secondary | ICD-10-CM

## 2022-12-11 DIAGNOSIS — M47816 Spondylosis without myelopathy or radiculopathy, lumbar region: Secondary | ICD-10-CM

## 2022-12-11 DIAGNOSIS — M4804 Spinal stenosis, thoracic region: Secondary | ICD-10-CM

## 2022-12-19 ENCOUNTER — Encounter
Payer: Medicare Other | Attending: Physical Medicine and Rehabilitation | Admitting: Physical Medicine and Rehabilitation

## 2022-12-19 ENCOUNTER — Encounter: Payer: Self-pay | Admitting: Physical Medicine and Rehabilitation

## 2022-12-19 VITALS — BP 114/76 | HR 112 | Temp 98.3°F | Ht 70.0 in | Wt 350.0 lb

## 2022-12-19 DIAGNOSIS — M47816 Spondylosis without myelopathy or radiculopathy, lumbar region: Secondary | ICD-10-CM | POA: Diagnosis not present

## 2022-12-19 DIAGNOSIS — M792 Neuralgia and neuritis, unspecified: Secondary | ICD-10-CM | POA: Diagnosis present

## 2022-12-19 NOTE — Therapy (Unsigned)
OUTPATIENT PHYSICAL THERAPY EVALUATION   Patient Name: Travis Fitzpatrick MRN: [id number] DOB:July 12, 1981, 42 y.o., male Today's Date: 12/26/2022  END OF SESSION:  PT End of Session - 12/26/22 2035     Visit Number 1    Number of Visits 13    Date for PT Re-Evaluation 03/20/23    Authorization Type MEDICARE PART B reporting period from 12/26/2022    Progress Note Due on Visit 10    PT Start Time 1733    PT Stop Time 1828    PT Time Calculation (min) 55 min    Activity Tolerance Patient limited by fatigue;Patient limited by pain    Behavior During Therapy [facility] for tasks assessed/performed             Past Medical History:  Diagnosis Date   Bowel trouble    urgency   Medical history non-contributory    Urinary urgency    Past Surgical History:  Procedure Laterality Date   BACK SURGERY  2010   CIRCUMCISION     LUMBAR LAMINECTOMY/DECOMPRESSION MICRODISCECTOMY  07/20/2011   Procedure: LUMBAR LAMINECTOMY/DECOMPRESSION MICRODISCECTOMY;  Surgeon: Carmela Hurt;  Location: [facility];  Service: Neurosurgery;  Laterality: N/A;  right thoracotomy with thoracic eight-nine discectomy and fusion   LUMBAR LAMINECTOMY/DECOMPRESSION MICRODISCECTOMY Right 10/06/2021   Procedure: Right Lumbar Four-Five Microdiscectomy, Right Lumbar Five-Sacal One Foraminotomy;  Surgeon: Coletta Memos, MD;  Location: [facility];  Service: Neurosurgery;  Laterality: Right;  3C/RM 21   THORACIC DISCECTOMY  07/16/2012   Procedure: THORACIC DISCECTOMY;  Surgeon: Carmela Hurt, MD;  Location: [facility];  Service: Neurosurgery;  Laterality: Right;  RIGHT Thoracic seven-eight  thoracic diskectomy via thoracotomy by dr Laneta Simmers   THORACIC DISCECTOMY N/A 12/15/2014   Procedure: THORACIC SEVEN TO THORACIC NINE Laminectomy ;  Surgeon: Coletta Memos, MD;  Location: [facility];  Service: Neurosurgery;  Laterality: N/A;   THORACIC DISCECTOMY N/A 06/06/2017   Procedure: LAMINECTOMY THORACIC NINE-TEN;  Surgeon: Coletta Memos, MD;   Location: [facility];  Service: Neurosurgery;  Laterality: N/A;  LAMINECTOMY THORACIC NINE-TEN   THORACOTOMY  07/20/2011   Procedure: THORACOTOMY OPEN FOR SPINE SURGERY;  Surgeon: Norton Blizzard, MD;  Location: [facility];  Service: Vascular;  Laterality: N/A;   THORACOTOMY  07/16/2012   Procedure: THORACOTOMY OPEN FOR SPINE SURGERY;  Surgeon: Alleen Borne, MD;  Location: [facility];  Service: Thoracic;  Laterality: N/A;   Patient Active Problem List   Diagnosis Date Noted   Nerve pain 12/05/2022   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   S/P lumbar spinal fusion 05/25/2022   Synovial cyst of lumbar facet joint 05/25/2022   HNP (herniated nucleus pulposus), lumbar 10/06/2021   Hand pain 06/28/2021   Lumbar facet arthropathy 01/11/2021   Thoracic spondylosis with myelopathy 01/11/2021   Thoracic spinal stenosis 06/06/2017   Stenosis, spinal, thoracic 12/15/2014   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 09/22/2014   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/2011    PCP: Nira Retort  REFERRING PROVIDER: Ranelle Oyster, MD   REFERRING DIAG: thoracic disc disease with myelopathy, nerve pain, adhesive arachnoiditis  Rationale for Evaluation and Treatment: Rehabilitation  THERAPY DIAG:  Other low back pain  Other symptoms and signs involving the nervous system  Radiculopathy, lumbar region  Muscle weakness (generalized)  Repeated falls  Difficulty in walking, not elsewhere classified  ONSET DATE:  Suddenly started having weakness 11 years ago, most recent episode of worsening October 2022, s/p right L4-5 laminotomy,  microdiscectomy on 10/06/2021, now s/p L4-5 PLIF by Dr. Franky Macho on 05/25/2022.  SUBJECTIVE:                                                                                                                                                                                           SUBJECTIVE STATEMENT: Patient is known to this clinic  and PT. He underwent a previous episode of care from 11/01/2021 to 05/16/2022 where he struggled with R > L LE strength and stability and low back and R>L LE pain that ultimately worsened resulting referral back to surgeon who determined another surgery was necessary. He is now s/p now s/p L4-5 PLIF by Dr. Franky Macho on 05/25/2022. He reports initial improvement but then worsening of R > L pain and weakness, especially with severe pain in the right hip with weight bearing on the R LE. He has reported problems with urinary and bowel urgency and incontinence that seem to worsen his leg symptoms when he feels he is going to have a BM. He was referred to outpatient PT again by Dr. Faith Rogue, MD (from [facility]) with diagnosis of lumbar spondylosis and radiculopathy s/p decompression and arachnoiditis for lumbar and lower extremity ROM, strengthening, modalities as indicated. Dr. Riley Kill, described his sensory deficits from history of thoracic myelopathy with sensory deficits from trunk down and a T8 sensory level upon exam.   Patient states he had the surgery in October 2023. He states everything was going okay except little setbacks of a day or so. About 4 weeks ago he was walking on his own without a cane or walker. He states suddenly he started having sharp pains from his the center of his lower back buttocks and right groin down to his calves to the point where he could not walk or drive. This is more on the right than the left but is in both legs. He states he has muscle spasms in the lower back region with pain shooting down the right throughout the day and night that keeps him from sleeping.  He also has shooting pain from his back through his buttocks, right groin, to both his legs but mainly his right he straightens out either leg (ipsilateral to pain)  bends over, or twists a certain way. The pain is like a pinched nerve feeling that is sharp and shooting. It lasts about  2-3 min if he rests. If he doesn't rest the next thing he knows he will hit the floor due to his legs buckling.  When he has pain his legs feel weak when he has the shooting pain and it is  worse when he feels the urge to urinate or have a BM. He sometimes does not make it to the bathroom before he is incontinent, but he always has an urge. He never feels like he is completely empty of urine or stool. He has been referred to a urologist, but he has not heard from them. When his pain got worse he talked to Dr. Franky Macho who did more MRIs, who said he did not think the problems is having are from the back and sent him to Dr. Riley Kill. At first Dr. Riley Kill thought maybe it was the patient's hip, but now he thinks the symptoms are due to adhesive arachnoiditis. He is to follow up with Dr. Riley Kill on 2 months. Patient states he thinks Dr. Riley Kill thinks massage and getting stronger may help. He states he has numbness down his legs that comes and goes.   Patient states this week is a good week. Last week he could barely walk. His wife drove him here today. He states he has fallen 5 times in the last 4 weeks. He got a gash on his back when he fell once. His wife helps him up. He always uses his RW or a rollator he has at the house. He has the equipment he needs to get in and out of the bath tub. He had home PT after the surgery and he was discharged because he was not getting better 4 weeks ago.   He had a NCV and EMG on 12/19/2022 and he has not heard anything from his providers for that. He thinks Dr. Franky Macho ordered the test.     PERTINENT HISTORY:  Patient is a 42 y.o. male who presents to outpatient physical therapy with a referral for medical diagnosis thoracic disc disease with myelopathy, nerve pain, adhesive arachnoiditis. This patient's chief complaints consist of disabling low back and R >L leg pain and weakness with activity, bowel and bladder urgency, incontinence, and retention, leading to the following  functional deficits: difficulty with or unable to complete any activity that requires weight bearing, use of B LE, and/or balance including working, household and community mobility, walking, driving, going to family gatherings, avoiding incontinent episodes, playing with daughter, helping around the house, bed mobility, transfers. 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 Franky Macho on 05/25/2022, adhesive arachnoiditis, nerve pain.  Patient denies hx of cancer, stroke, seizures, lung problems, heart problems, diabetes, unexplained weight loss, and osteoporosis   PAIN:  Are you having pain? Yes: NPRS scale: Current: 6/10,  Best: 6/10, Worst: 8/10. Pain location: low back, R groin, B posterior thighs to outside of R calf and posterior L calf. Pain description: like a nerve, shooting, throbbing, aching Aggravating factors: straightening leg, bending, twisting, weight bearing.  Relieving factors: laying in the bed with legs bent and pillow propped under it, gabapentin helps a little.   FUNCTIONAL LIMITATIONS: difficulty with or unable to complete any activity that requires weight bearing, use of B LE, and/or balance including working, household and community mobility, walking, driving, going to family gatherings, avoiding incontinent episodes, playing with daughter, helping around the house, bed mobility, transfers.   LEISURE: video-games, sports,  play with daughter  PRECAUTIONS: Fall  WEIGHT BEARING RESTRICTIONS: No  FALLS:  Has patient fallen in last 6 months? Yes. Number of falls 7  since surgery (5 in the last 4 weeks)  LIVING ENVIRONMENT: Lives with: lives with their spouse and lives with their daughter (109.70 years old) Lives in: one story duplex Stairs: 2 steps to enter with left handrail  Has following equipment at home: Single point cane, Environmental consultant - 2 wheeled, Environmental consultant - 4 wheeled, Shower bench, Grab bars, and step stool to get in the bed, grabber, sock aide, bedrail.   OCCUPATION: on disability for this problem   PLOF: Independent  PATIENT GOALS: "To get better, walk, drive again, climb stairs, play with my daughter, help around the house again"  NEXT MD VISIT: 01/30/2023 with Dr. Riley Kill.   OBJECTIVE  DIAGNOSTIC FINDINGS:  Lumbar MRI report from 10/31/2022:  CLINICAL DATA:  Right leg pain and numbness along with instability, worsening over the past 2 weeks   EXAM: MRI LUMBAR SPINE WITHOUT AND WITH CONTRAST   TECHNIQUE: Multiplanar and multiecho pulse sequences of the lumbar spine were obtained without and with intravenous contrast.   CONTRAST:  10mL GADAVIST GADOBUTROL 1 MMOL/ML IV SOLN   COMPARISON:  04/26/2022   FINDINGS: Segmentation: The lowest lumbar type non-rib-bearing vertebra is labeled as L5.   Alignment:  3 mm degenerative retrolisthesis at L5-S1.   Vertebrae:  Congenitally short pedicles in the lumbar spine.   Posterolateral rod and pedicle screw fixation along with interbody spacer at L4-5. Mild physiologic anterior wedging at T12. Type 2 degenerative endplate findings at L5-S1.   Conus medullaris and cauda equina: Conus extends to the upper L2 level. Conus and cauda equina appear normal. Clumping of nerve roots centrally at the L4 level is probably due to the central narrowing of the thecal sac at L3-4 rather than arachnoiditis.   Paraspinal and other soft tissues: Posterior decompression at L4-5 with enhancement and edema signal in the midline posterior paraspinal tissues in the operative region.   Disc levels:   L1-2:  Unremarkable   L2-3: Unremarkable   L3-4: Moderate central narrowing of the thecal sac with mild left foraminal stenosis and mild right subarticular lateral recess stenosis due to disc bulge, intervertebral spurring, short pedicles, and facet arthropathy. Impingement increased from prior.   L4-5: No impingement identified. Postoperative level with posterior decompression. Previous right synovial cyst no longer present. Expected epidural fibrosis at this level.   L5-S1: Suspected moderate bilateral foraminal stenosis and mild left subarticular lateral recess stenosis due to intervertebral spurring, disc bulge, and facet arthropathy. Foramina somewhat obscured by metal artifact.   IMPRESSION: 1. Lumbar spondylosis, degenerative disc disease, and congenitally short pedicles contributing to moderate impingement at L3-4 and L5-S1. 2. Postoperative findings at L4-5, without recurrent synovial cyst or impingement. 3. Clumping of nerve roots centrally at the L4 level is probably due to the central narrowing of the thecal sac at L3-4 rather than arachnoiditis.     Electronically Signed   By: Gaylyn Rong M.D.   On: 11/03/2022 10:29  R Hip Xray report from 12/06/2022:  CLINICAL DATA:  Right hip pain.   EXAM: DG HIP (WITH OR WITHOUT PELVIS) 2-3V RIGHT   COMPARISON:  None Available.   FINDINGS: Mild bilateral hip joint degenerative changes. No evidence for acute fracture or dislocation. Lumbar spinal fusion hardware. SI joints unremarkable. Nonspecific calcification left hemipelvis, potentially vascular. Ureteral or bladder calcifications not excluded.   IMPRESSION: 1. Mild bilateral hip joint  degenerative changes. 2. Nonspecific calcification left hemipelvis. Ureteral or bladder calcifications not excluded.     Electronically Signed   By: Annia Belt M.D.   On: 12/10/2022 14:54  NCV and EMG Findings from documentation 12/19/2022:  Evaluation of the right deep branch  fibular (TA) motor nerve showed prolonged distal onset latency (6.1 ms).  The left fibular (EDB) motor and the right tibial (AHB) motor nerves showed reduced amplitude (L1.13, R2.4 mV).  The right fibular (EDB) motor nerve showed reduced amplitude (0.19 mV) and decreased conduction velocity (Pop Fossa-Bel Fib Head, 42 m/s).  All remaining nerves (as indicated in the following tables) were within normal limits.     Needle evaluation of the left tibialis anterior muscle showed increased insertional activity, slightly increased spontaneous activity, increased motor unit amplitude, increased motor unit duration, slightly increased polyphasic potentials, and diminished recruitment.  The right tibialis anterior muscle showed increased insertional activity, increased spontaneous activity, increased motor unit amplitude, increased motor unit duration, slightly increased polyphasic potentials, and diminished recruitment.  The right vastus medius muscle showed slightly increased polyphasic potentials.  All remaining muscles (as indicated in the following table) showed no evidence of electrical instability.     Impression: This is an abnormal study. There is electrodiagnostic evidence of: Bilateral chronic L4 radiculopathies, R>L, with signs of active denervation   This was a challenging study due to patient body habitus, peripheral edema, and poor tolerance of patient positioning due to pain. Lumbar paraspinals could not be tested due to local surgical intervention. Note this test is not sensitive for small fiber neuropathies or central neurologic conditions.   OBSERVATION/INSPECTION Posture Posture (standing): heavy weight bearing through B UE on bari-RW, B genuvalgum and genurecurvatum.  Anthropometrics Tremor: none Body composition: Morbidly obese Edema: B LE R > L Functional Mobility Bed mobility: Deferred today Transfers: sit <> stand mod I with heavy B UE use on RW (does not push off sitting surface).   Gait: ambulates with bari-RW with heavy UE support, poor foot clearance, short step length, R foot drop with R toe dragging on floor at times, slight circumduction gait with R LE.  Stairs: not tested  NEUROLOGICAL  Upper Motor Neuron Screen Clonus (ankle) negative bilaterally.  Dermatomes L2-S2 absent to light touch on R LE and decreased to light touch on L LE. Exception lateral lower leg is decreased but equal to light touch bilaterally.   Deep Tendon Reflexes R/L  0+/0+ Quadriceps reflex (L4) 0+/0+ Achilles reflex (S1)  PERIPHERAL JOINT MOTION (in degrees)  PASSIVE RANGE OF MOTION (PROM) Comments: B LE grossly WFL for basic mobility except R LE movement caused R hip pain that is limiting.   MUSCLE PERFORMANCE (MMT):  *Indicates pain 12/26/22 Date Date  Joint/Motion R/L R/L R/L  Hip     Flexion (L1, L2) 2-*/2- / /  Extension (knee ext) / / /  Abduction (sitting) 4+*/4+ / /  Adduction (sitting) 4*/4 / /  External rotation / / /  Internal rotation  / / /  Knee     Extension (L3) 4-*/4+ / /  Flexion (S2) 3+/4 / /  Ankle/Foot     Dorsiflexion (L4) 2+/3+ / /  Great toe extension (L5) 3/4 / /  Eversion (S1) 4/4 / /  Plantarflexion (S1) 3+/4 / /  Inversion / / /  Pronation / / /  Great toe flexion / / /  Comments:  12/26/2022: Not measured if blank. B UE appear grossly WNL  FUNCTIONAL/BALANCE TESTS: Five Time  Sit to Stand (5TSTS): 23 seconds with heavy B UE support on RW from 19.5 inch plinth. Pain throbbing down the right LE.   Ambulation (max distance): 34 feet with bari-RW with heavy UE support, poor foot clearance, short step length, R foot drop with R toe dragging on floor at times, slight circumduction gait with R LE.  Reports pain and throbbing through groin and needed to sit due to risk of B LE buckling. SBA with chair follow. Needed a chair follow with a seated rest due to pain and  LE weakness while trying to get from clinic to vehicle after session.    TODAY'S  TREATMENT:    Education  PATIENT EDUCATION:  Education details: Education on diagnosis, prognosis, POC, anatomy and physiology of current condition.  Person educated: patient Education method: Explanation Education comprehension: verbalized understanding and needs further education  HOME EXERCISE PROGRAM: TBD   ASSESSMENT:  CLINICAL IMPRESSION: Patient is a 42 y.o. male referred to outpatient physical therapy with a medical diagnosis of thoracic disc disease with myelopathy, nerve pain, adhesive arachnoiditis who presents with signs and symptoms consistent with chronic low back and thoracic spine pain and neurogenic weakness of B LE. Patient presents with significant pain, neurologic weakness, radiculopathy, balance, coordination, motor control, balance, sensation, bowel and bladder continence, muscle performance (strength/power/endurance), and activity tolerance impairments that are limiting ability to complete difficulty with or unable to complete any activity that requires weight bearing, use of B LE, and/or balance including working, household and community mobility, walking, driving, going to family gatherings, avoiding incontinent episodes, playing with daughter, helping around the house, bed mobility, transfers without difficulty. Patient would benefit from a wheelchair to improve mobility and social participation. He would also benefit from an AFO for right foot drop to improve gait. Patient will benefit from skilled physical therapy intervention to address current body structure impairments and activity limitations to improve function and work towards goals set in current POC in order to return to prior level of function or maximal functional improvement.    OBJECTIVE IMPAIRMENTS: Abnormal gait, decreased activity tolerance, decreased balance, decreased coordination, decreased endurance, decreased knowledge of condition, decreased knowledge of use of DME, decreased mobility, difficulty  walking, decreased ROM, decreased strength, increased edema, impaired perceived functional ability, increased muscle spasms, impaired flexibility, impaired sensation, impaired tone, improper body mechanics, postural dysfunction, obesity, and pain.   ACTIVITY LIMITATIONS: carrying, lifting, bending, standing, squatting, stairs, transfers, bed mobility, continence, bathing, toileting, dressing, hygiene/grooming, locomotion level, and caring for others  PARTICIPATION LIMITATIONS: meal prep, cleaning, laundry, interpersonal relationship, driving, shopping, community activity, occupation, yard work, and   difficulty with or unable to complete any activity that requires weight bearing, use of B LE, and/or balance including working, household and community mobility, walking, driving, going to family gatherings, avoiding incontinent episodes, playing with daughter, helping around the house, bed mobility, transfers  PERSONAL FACTORS: Past/current experiences, Time since onset of injury/illness/exacerbation, and 3+ comorbidities:   3 thoracic spine surgeries, thoracic MRI notes "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 Franky Macho on 05/25/2022, adhesive arachnoiditis, nerve pain are also affecting patient's functional outcome.   REHAB POTENTIAL: Fair due to severity and nature of condition.   CLINICAL DECISION MAKING: Evolving/moderate complexity  EVALUATION COMPLEXITY: Moderate   GOALS: Goals reviewed with patient? No  SHORT TERM GOALS: Target date: 01/09/2023  Patient will be independent with initial home exercise program for self-management of symptoms. Baseline: Initial HEP to be provided  at visit 2 as appropriate (12/26/22); Goal status: INITIAL   LONG TERM GOALS: Target date: 03/20/2023  Patient will be independent with a long-term home exercise program for self-management of symptoms.  Baseline: Initial HEP to be provided at visit 2 as appropriate (12/26/22); Goal status: INITIAL  2.  Patient will demonstrate improved FOTO by equal or greater than 10 points by visit #13 to demonstrate improvement in overall condition and self-reported functional ability.  Baseline: to be tested visit 2 as appropriate (12/26/22); Goal status: INITIAL  3.  Patient will demonstrate the ability to ambulate equal or greater than 600 feet with LRAD during the 6 minute walk to improve his household and community mobility.  Baseline: 34 feet with bari-RW (12/26/22); Goal status: INITIAL  4.  Patient will complete 5 Time Sit To Stand Test from 19.5 inch surface or lower in equal or less than 15 seconds with no UE support to demonstrate improved transfer ability for improved household and toileting mobility.  Baseline: 23 seconds with heavy B UE support on RW from 19.5 inch plinth. Pain throbbing down the right LE.  (12/26/22); Goal status: INITIAL  5.  Patient will report worst pain equal or less than 3/10 with functional activities to improve his ability to complete basic household and community mobility. Baseline: up to 8/10 (12/26/22); Goal status: INITIAL   PLAN:  PT FREQUENCY: 1-2x/week  PT DURATION: 12 weeks  PLANNED INTERVENTIONS: Therapeutic exercises, Therapeutic activity, Neuromuscular re-education, Balance training, Gait training, Patient/Family education, Self Care, Joint mobilization, Stair training, Orthotic/Fit training, DME instructions, Aquatic Therapy, Dry Needling, Electrical stimulation, Wheelchair mobility training, Spinal mobilization, Cryotherapy, Moist heat, Manual therapy, and Re-evaluation.  PLAN FOR NEXT SESSION: update HEP as appropriate, neurodynamics, gait  training, LE/core/functional strengthening, stretching, and balance as tolerated, education, manual therapy as needed.   Cira Rue, PT, DPT 12/27/2022, 3:56 PM   [facility] [address] [place], [place] [address] P: [phone] I F: [phone]

## 2022-12-19 NOTE — Progress Notes (Signed)
Physical Medicine [facility] Rehabilitation [address] [place] [address] Office: [phone] Fax: [phone]  Test Date:  12/19/2022  Patient: Travis Fitzpatrick DOB:  Physician: Bobetta Lime. Dalinda Heidt MD  Sex: Male Height: 5\' 10"  Ref Phys:   ID#: [id number] Weight: 350 lbs. Technician:    Patient Complaints: Dr. Virgel Gess did last EMG pre-op; now referred by Dr. Franky Macho. Per patient symptoms are "getting worse" since lumbar decompressions/fusions. Pt endorses both pain and weakness, R>L   Medications: Gabapentin and norttyptilline; no blood thinners. No Hx chemo.   Patient History / Exam: s/p L4/5 fusion 05/25/2022; prior had L4/5 microdiscectomy 10/06/2021  Per patient: Pain on R shoots from low back to posterior thigh and calf, sharp, with numbness/tingling; also groin pain on R. Improves with bending R leg.  No numbness/tingling on L.    Exam:  + Morbid obesity, BL LE edema 1+ to the knee Weakness in R>L ankle dorsiflexion, 4/5. R hip flexion limited by hip/back pain. Otherwise, 5/5. Sensory decrease in R lateral malleolus and calf (S1) and medial and lateral knee (L4); otherwise, equal bilaterally Reflexes equal bilateral patella and achilles, no Babinski or clonus.    NCV & EMG Findings: Evaluation of the right deep branch fibular (TA) motor nerve showed prolonged distal onset latency (6.1 ms).  The left fibular (EDB) motor and the right tibial (AHB) motor nerves showed reduced amplitude (L1.13, R2.4 mV).  The right fibular (EDB) motor nerve showed reduced amplitude (0.19 mV) and decreased conduction velocity (Pop Fossa-Bel Fib Head, 42 m/s).  All remaining nerves (as indicated in the following tables) were within normal limits.    Needle evaluation of the left tibialis anterior muscle showed increased insertional activity, slightly increased spontaneous activity, increased motor unit amplitude, increased motor unit duration, slightly increased polyphasic potentials, and diminished  recruitment.  The right tibialis anterior muscle showed increased insertional activity, increased spontaneous activity, increased motor unit amplitude, increased motor unit duration, slightly increased polyphasic potentials, and diminished recruitment.  The right vastus medius muscle showed slightly increased polyphasic potentials.  All remaining muscles (as indicated in the following table) showed no evidence of electrical instability.    Impression: This is an abnormal study. There is electrodiagnostic evidence of: Bilateral chronic L4 radiculopathies, R>L, with signs of active denervation  This was a challenging study due to patient body habitus, peripheral edema, and poor tolerance of patient positioning due to pain. Lumbar paraspinals could not be tested due to local surgical intervention. Note this test is not sensitive for small fiber neuropathies or central neurologic conditions.   Recommendations: Follow up with Dr. Franky Macho   ___________________________ Bobetta Lime. Cianna Kasparian MD    Nerve Conduction Studies Motor Nerve Results    Latency Amplitude F-Lat Segment Distance CV Comment  Site (ms) Norm (mV) Norm (ms)  (cm) (m/s) Norm   Right Dp Branch Fibular (TA) Motor  Fib Head *6.1  < 4.2 0.52 -        Left Fibular (EDB) Motor  Ankle 4.0  < 6.1 *1.13  > 2.0        Bel Fib Head 10.1 - 0.93 -  Bel Fib Head-Ankle 32 52  > 38   Pop Fossa 12.1 - 0.91 -  Pop Fossa-Bel Fib Head 10 50  > 42   Right Fibular (EDB) Motor  Ankle 5.4  < 6.1 *0.19  > 2.0        Bel Fib Head 11.4 - 0.10 -  Bel Fib Head-Ankle 32 53  >  38   Pop Fossa 13.8 - 0.27 -  Pop Fossa-Bel Fib Head 10 *42  > 42   Left Tibial (AHB) Motor  Ankle 5.1  < 6.1 4.9  > 4.4        Knee 13.3 - 0.26 -  Knee-Ankle 39 48  > 35   Right Tibial (AHB) Motor  Ankle 4.9  < 6.1 *2.4  > 4.4        Knee 16.3 - 0.14 -  Knee-Ankle 40 35  > 35    Sensory Nerve Results    Latency (Peak) Amplitude (P-P) Segment Distance CV Comment  Site (ms) Norm  (V) Norm  (cm) (m/s) Norm   Left Superficial Fibular Sensory  14 cm-Ankle 3.7 - 9  > 5 14 cm-Ankle 14 38  > 32   Right Superficial Fibular Sensory  14 cm-Ankle 3.7 - 5  > 5 14 cm-Ankle 14 38  > 32   Left Sural Sensory  Calf-Lat Mall 3.6  < 4.0 25  > 5 Calf-Lat Mall 14 39  > 35   Right Sural Sensory  Calf-Lat Mall 3.7  < 4.0 12  > 5 Calf-Lat Mall 14 38  > 35     Electromyography   Side Muscle Nerve Root Ins Act Fibs Psw Amp Dur Poly Recrt Int Pat Comment  Left Tib Anterior Fibular,  Deep Fibula... L4-L5 *Incr *1+ *1+ *Incr *>95ms *1+ *Reduced Nml   Left Gastroc Tibial S1-S2 Nml Nml Nml Nml Nml 0 Nml Nml   Left Vastus Med Femoral L2-L4 Nml Nml Nml Nml Nml 0 Nml Nml   Left Iliopsoas Femoral L2-L3 Nml Nml Nml Nml Nml 0 Nml Nml   Left Biceps Fem SH Sciatic L5-S1 Nml Nml Nml Nml Nml 0 Nml Nml   Right Tib Anterior Fibular,  Deep Fibula... L4-L5 *Incr *1+ *3+ *Incr *>50ms *1+ *Reduced Nml   Right Gastroc Tibial S1-S2 Nml Nml Nml Nml Nml 0 Nml Nml   Right Biceps Fem SH Sciatic L5-S1 Nml Nml Nml Nml Nml 0 Nml Nml   Right Vastus Med Femoral L2-L4 Nml Nml Nml Nml Nml *1+ Nml Nml      NCS Waveforms:  Motor             Sensory

## 2022-12-26 ENCOUNTER — Encounter: Payer: Self-pay | Admitting: Physical Therapy

## 2022-12-26 ENCOUNTER — Ambulatory Visit: Payer: Medicare Other | Attending: Physical Medicine & Rehabilitation | Admitting: Physical Therapy

## 2022-12-26 DIAGNOSIS — R262 Difficulty in walking, not elsewhere classified: Secondary | ICD-10-CM

## 2022-12-26 DIAGNOSIS — M5459 Other low back pain: Secondary | ICD-10-CM | POA: Diagnosis present

## 2022-12-26 DIAGNOSIS — M5416 Radiculopathy, lumbar region: Secondary | ICD-10-CM

## 2022-12-26 DIAGNOSIS — M6281 Muscle weakness (generalized): Secondary | ICD-10-CM

## 2022-12-26 DIAGNOSIS — G039 Meningitis, unspecified: Secondary | ICD-10-CM | POA: Diagnosis not present

## 2022-12-26 DIAGNOSIS — R296 Repeated falls: Secondary | ICD-10-CM | POA: Diagnosis present

## 2022-12-26 DIAGNOSIS — R29818 Other symptoms and signs involving the nervous system: Secondary | ICD-10-CM

## 2022-12-26 DIAGNOSIS — M5104 Intervertebral disc disorders with myelopathy, thoracic region: Secondary | ICD-10-CM | POA: Diagnosis not present

## 2022-12-26 DIAGNOSIS — M792 Neuralgia and neuritis, unspecified: Secondary | ICD-10-CM | POA: Diagnosis not present

## 2022-12-31 ENCOUNTER — Encounter: Payer: Self-pay | Admitting: Physical Therapy

## 2022-12-31 ENCOUNTER — Ambulatory Visit: Payer: Medicare Other | Admitting: Physical Therapy

## 2022-12-31 DIAGNOSIS — M5459 Other low back pain: Secondary | ICD-10-CM

## 2022-12-31 DIAGNOSIS — R296 Repeated falls: Secondary | ICD-10-CM

## 2022-12-31 DIAGNOSIS — R262 Difficulty in walking, not elsewhere classified: Secondary | ICD-10-CM

## 2022-12-31 DIAGNOSIS — R29818 Other symptoms and signs involving the nervous system: Secondary | ICD-10-CM

## 2022-12-31 DIAGNOSIS — M6281 Muscle weakness (generalized): Secondary | ICD-10-CM

## 2022-12-31 DIAGNOSIS — M5416 Radiculopathy, lumbar region: Secondary | ICD-10-CM

## 2022-12-31 NOTE — Therapy (Signed)
OUTPATIENT PHYSICAL THERAPY EVALUATION   Patient Name: Travis Fitzpatrick MRN: [id number] DOB:04-20-1981, 42 y.o., male Today's Date: 12/31/22   END OF SESSION:  PT End of Session - 12/31/22 1930     Visit Number 2    Number of Visits 13    Date for PT Re-Evaluation 03/20/23    Authorization Type MEDICARE PART B reporting period from 12/26/2022    Progress Note Due on Visit 10    PT Start Time 1823    PT Stop Time 1905    PT Time Calculation (min) 42 min    Activity Tolerance Patient limited by fatigue;Patient limited by pain;Patient tolerated treatment well    Behavior During Therapy [facility] for tasks assessed/performed              Past Medical History:  Diagnosis Date   Bowel trouble    urgency   Medical history non-contributory    Urinary urgency    Past Surgical History:  Procedure Laterality Date   BACK SURGERY  2010   CIRCUMCISION     LUMBAR LAMINECTOMY/DECOMPRESSION MICRODISCECTOMY  07/20/2011   Procedure: LUMBAR LAMINECTOMY/DECOMPRESSION MICRODISCECTOMY;  Surgeon: Carmela Hurt;  Location: [facility];  Service: Neurosurgery;  Laterality: N/A;  right thoracotomy with thoracic eight-nine discectomy and fusion   LUMBAR LAMINECTOMY/DECOMPRESSION MICRODISCECTOMY Right 10/06/2021   Procedure: Right Lumbar Four-Five Microdiscectomy, Right Lumbar Five-Sacal One Foraminotomy;  Surgeon: Coletta Memos, MD;  Location: [facility];  Service: Neurosurgery;  Laterality: Right;  3C/RM 21   THORACIC DISCECTOMY  07/16/2012   Procedure: THORACIC DISCECTOMY;  Surgeon: Carmela Hurt, MD;  Location: [facility];  Service: Neurosurgery;  Laterality: Right;  RIGHT Thoracic seven-eight  thoracic diskectomy via thoracotomy by dr Laneta Simmers   THORACIC DISCECTOMY N/A 12/15/2014   Procedure: THORACIC SEVEN TO THORACIC NINE Laminectomy ;  Surgeon: Coletta Memos, MD;  Location: [facility];  Service: Neurosurgery;  Laterality: N/A;   THORACIC DISCECTOMY N/A 06/06/2017   Procedure: LAMINECTOMY THORACIC  NINE-TEN;  Surgeon: Coletta Memos, MD;  Location: [facility];  Service: Neurosurgery;  Laterality: N/A;  LAMINECTOMY THORACIC NINE-TEN   THORACOTOMY  07/20/2011   Procedure: THORACOTOMY OPEN FOR SPINE SURGERY;  Surgeon: Norton Blizzard, MD;  Location: [facility];  Service: Vascular;  Laterality: N/A;   THORACOTOMY  07/16/2012   Procedure: THORACOTOMY OPEN FOR SPINE SURGERY;  Surgeon: Alleen Borne, MD;  Location: [facility];  Service: Thoracic;  Laterality: N/A;   Patient Active Problem List   Diagnosis Date Noted   Nerve pain 12/05/2022   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   S/P lumbar spinal fusion 05/25/2022   Synovial cyst of lumbar facet joint 05/25/2022   HNP (herniated nucleus pulposus), lumbar 10/06/2021   Hand pain 06/28/2021   Lumbar facet arthropathy 01/11/2021   Thoracic spondylosis with myelopathy 01/11/2021   Thoracic spinal stenosis 06/06/2017   Stenosis, spinal, thoracic 12/15/2014   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 09/22/2014   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/2011    PCP: Nira Retort  REFERRING PROVIDER: Ranelle Oyster, MD   REFERRING DIAG: thoracic disc disease with myelopathy, nerve pain, adhesive arachnoiditis  Rationale for Evaluation and Treatment: Rehabilitation  THERAPY DIAG:  Other low back pain  Other symptoms and signs involving the nervous system  Radiculopathy, lumbar region  Muscle weakness (generalized)  Repeated falls  Difficulty in walking, not elsewhere classified  ONSET DATE:  Suddenly started having weakness 11 years ago, most recent episode of worsening October  2022, s/p right L4-5 laminotomy, microdiscectomy on 10/06/2021, now s/p L4-5 PLIF by Dr. Franky Macho on 05/25/2022.  PERTINENT HISTORY:  Patient is a 42 y.o. male who presents to outpatient physical therapy with a referral for medical diagnosis thoracic disc disease with myelopathy, nerve pain, adhesive arachnoiditis. This  patient's chief complaints consist of disabling low back and R >L leg pain and weakness with activity, bowel and bladder urgency, incontinence, and retention, leading to the following functional deficits: difficulty with or unable to complete any activity that requires weight bearing, use of B LE, and/or balance including working, household and community mobility, walking, driving, going to family gatherings, avoiding incontinent episodes, playing with daughter, helping around the house, bed mobility, transfers. 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 Franky Macho on 05/25/2022, adhesive arachnoiditis, nerve pain.  Patient denies hx of cancer, stroke, seizures, lung problems, heart problems, diabetes, unexplained weight loss, and osteoporosis  SUBJECTIVE:                                                                                                                                                                                           SUBJECTIVE STATEMENT: Patient arrives reporting he is feeling better than at initial eval and that he did okay after going home following initial eval. He arrives using his bari-RW  PAIN:  NPRS: 5/10 low back and down bilateral legs to calves.   PRECAUTIONS: Fall  WEIGHT BEARING RESTRICTIONS: No  PATIENT GOALS: "To get better, walk, drive again, climb stairs, play with my daughter, help around the house again"  NEXT MD VISIT: 01/30/2023 with Dr. Riley Kill.   OBJECTIVE   TODAY'S TREATMENT:    Therapeutic exercise: to centralize symptoms and improve  ROM, strength, muscular endurance, and activity tolerance required for successful completion of functional activities. - NuStep level 2 using bilateral upper and lower extremities. Seat/handle setting 11/10. For improved extremity mobility, muscular endurance, and activity tolerance; and to induce the analgesic effect of aerobic exercise, stimulate improved joint nutrition, and prepare body structures and systems for following interventions. x 5  minutes. Increased pain in bilateral proximal gastrocs.  - Seated lumbar flexion roll out with physioball, 1x1 min.  - seated lumbar flexion roll out with physioball with one knee extended as tolerated, 2x10 each side (nerve glide,  cuing for head motion).  - sit <> supine, mod I - hooklying with feet on wedge/block:  - ankle pumps 1x10 each side, SAQ 1x10/4 L/R (pain limiting R), heel slides 1x10 each side - curl up with arms crossed over chest, 5x15 seconds - rolling R and L, mod I - quadruped rock back to child's pose, 1x4 (discontinued due to heavy pain at low back and legs).  - Sidelying open book (thoracic rotation) to improve thoracic, lumbar, shoulder girdle, and upper trunk mobility. 1x10 each side with min A to help get LE in place and hold there.  - ambulation with RW around clinic with SBA and ~100 feet down ramp down curb to vehicle from clinic, SBA with W/C follow.  - Education on HEP verbally  Pt required multimodal cuing for proper technique and to facilitate improved neuromuscular control, strength, range of motion, and functional ability resulting in improved performance and form.  PATIENT EDUCATION:  Education details: Education on diagnosis, prognosis, POC, anatomy and physiology of current condition. Article handout.  Person educated: patient Education method: Explanation Education comprehension: verbalized understanding and needs further education  HOME EXERCISE PROGRAM: Verbally:  - seated lumbar flexion stretch with head down  and one LE extended using rollator, 1x10 each side.  - curl up with legs elevated, 5x15 seconds - sidelying open book 1x10 each side   ASSESSMENT:  CLINICAL IMPRESSION: Patient arrives using bari-RW reporting feeling a little better than last session. Today's sesson focused on exercises to mobilize the nervous system, hips, and spinal regions to improve neural mobility and ROM. Also included some strengthening exercises for the core and LE. Patient had pain throughout the session, most severely with attempt to achieve child's pose from quadruped. He also demonstrates significant neural tension. Patient provided education on his condition and suggested to start thinking about adaptations to his condition such as getting a wheelchair to help him participate more fully in the community and to use for longer distance. Patient also encouraged to continue with mobility as tolerated. Patient reported feeling better by the end of today's session, and he was able to ambulate all the way to his vehicle using his RW but without a seated rest. He was unable to feel that he had lost his shoes during the session, and needed his own UE support or PT physical assist for B LE management due to lack of strength and feeling. Patient would benefit from continued management of limiting condition by skilled physical therapist to address remaining impairments and functional limitations to work towards stated goals and return to PLOF or maximal functional independence.   From initial PT Evaluation 12/26/2022:  Patient is a 42 y.o. male referred to outpatient physical therapy with a medical diagnosis of thoracic disc disease with myelopathy, nerve pain, adhesive arachnoiditis who presents with signs and symptoms consistent with chronic low back and thoracic spine pain and neurogenic weakness of B LE. Patient presents with significant pain, neurologic weakness, radiculopathy, balance, coordination, motor control, balance,  sensation, bowel and bladder continence, muscle performance (strength/power/endurance), and activity tolerance impairments that are limiting ability to complete difficulty with or unable to complete any activity that requires weight bearing, use of B LE, and/or balance including working, household and community mobility, walking, driving, going to family gatherings, avoiding incontinent episodes, playing with daughter, helping around the house, bed mobility, transfers without difficulty. Patient would benefit from a wheelchair to improve mobility and social participation. He would also benefit from an AFO for right foot drop  to improve gait. Patient will benefit from skilled physical therapy intervention to address current body structure impairments and activity limitations to improve function and work towards goals set in current POC in order to return to prior level of function or maximal functional improvement.    OBJECTIVE IMPAIRMENTS: Abnormal gait, decreased activity tolerance, decreased balance, decreased coordination, decreased endurance, decreased knowledge of condition, decreased knowledge of use of DME, decreased mobility, difficulty walking, decreased ROM, decreased strength, increased edema, impaired perceived functional ability, increased muscle spasms, impaired flexibility, impaired sensation, impaired tone, improper body mechanics, postural dysfunction, obesity, and pain.   ACTIVITY LIMITATIONS: carrying, lifting, bending, standing, squatting, stairs, transfers, bed mobility, continence, bathing, toileting, dressing, hygiene/grooming, locomotion level, and caring for others  PARTICIPATION LIMITATIONS: meal prep, cleaning, laundry, interpersonal relationship, driving, shopping, community activity, occupation, yard work, and   difficulty with or unable to complete any activity that requires weight bearing, use of B LE, and/or balance including working, household and community mobility, walking,  driving, going to family gatherings, avoiding incontinent episodes, playing with daughter, helping around the house, bed mobility, transfers  PERSONAL FACTORS: Past/current experiences, Time since onset of injury/illness/exacerbation, and 3+ comorbidities:   3 thoracic spine surgeries, thoracic MRI notes "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 Franky Macho on 05/25/2022, adhesive arachnoiditis, nerve pain are also affecting patient's functional outcome.   REHAB POTENTIAL: Fair due to severity and nature of condition.   CLINICAL DECISION MAKING: Evolving/moderate complexity  EVALUATION COMPLEXITY: Moderate   GOALS: Goals reviewed with patient? No  SHORT TERM GOALS: Target date: 01/09/2023  Patient will be independent with initial home exercise program for self-management of symptoms. Baseline: Initial HEP to be provided at visit 2 as appropriate (12/26/22); Goal status: INITIAL   LONG TERM GOALS: Target date: 03/20/2023  Patient will be independent with a long-term home exercise program for self-management of symptoms.  Baseline: Initial HEP to be provided at visit 2 as appropriate (12/26/22); Goal status: INITIAL  2.  Patient will demonstrate improved FOTO by equal or greater than 10 points by visit #13 to demonstrate improvement in overall condition and self-reported functional ability.  Baseline: to be tested visit 2 as appropriate (12/26/22); Goal status: INITIAL  3.  Patient will demonstrate the ability to ambulate equal or greater than 600 feet with LRAD during the 6 minute walk to improve his household and community mobility.  Baseline: 34 feet with bari-RW  (12/26/22); Goal status: INITIAL  4.  Patient will complete 5 Time Sit To Stand Test from 19.5 inch surface or lower in equal or less than 15 seconds with no UE support to demonstrate improved transfer ability for improved household and toileting mobility.  Baseline: 23 seconds with heavy B UE support on RW from 19.5 inch plinth. Pain throbbing down the right LE.  (12/26/22); Goal status: INITIAL  5.  Patient will report worst pain equal or less than 3/10 with functional activities to improve his ability to complete basic household and community mobility. Baseline: up to 8/10 (12/26/22); Goal status: INITIAL   PLAN:  PT FREQUENCY: 1-2x/week  PT DURATION: 12 weeks  PLANNED INTERVENTIONS: Therapeutic exercises, Therapeutic activity, Neuromuscular re-education, Balance training, Gait training, Patient/Family education, Self Care, Joint mobilization, Stair training, Orthotic/Fit  training, DME instructions, Aquatic Therapy, Dry Needling, Electrical stimulation, Wheelchair mobility training, Spinal mobilization, Cryotherapy, Moist heat, Manual therapy, and Re-evaluation.  PLAN FOR NEXT SESSION: update HEP as appropriate, neurodynamics, gait training, LE/core/functional strengthening, stretching, and balance as tolerated, education, manual therapy as needed.   Cira Rue, PT, DPT 12/31/2022, 7:33 PM   [facility] [address] [place], [place] [address] P: [phone] I F: [phone]

## 2023-01-02 ENCOUNTER — Encounter: Payer: Self-pay | Admitting: Physical Therapy

## 2023-01-02 ENCOUNTER — Ambulatory Visit: Payer: Medicare Other | Admitting: Physical Therapy

## 2023-01-02 DIAGNOSIS — R29818 Other symptoms and signs involving the nervous system: Secondary | ICD-10-CM

## 2023-01-02 DIAGNOSIS — M5416 Radiculopathy, lumbar region: Secondary | ICD-10-CM

## 2023-01-02 DIAGNOSIS — M5459 Other low back pain: Secondary | ICD-10-CM | POA: Diagnosis not present

## 2023-01-02 DIAGNOSIS — R296 Repeated falls: Secondary | ICD-10-CM

## 2023-01-02 DIAGNOSIS — M6281 Muscle weakness (generalized): Secondary | ICD-10-CM

## 2023-01-02 DIAGNOSIS — R262 Difficulty in walking, not elsewhere classified: Secondary | ICD-10-CM

## 2023-01-02 NOTE — Therapy (Signed)
OUTPATIENT PHYSICAL THERAPY EVALUATION   Patient Name: Travis Fitzpatrick MRN: [id number] DOB:1980-08-22, 42 y.o., male Today's Date: 01/02/23   END OF SESSION:  PT End of Session - 01/02/23 1706     Visit Number 3    Number of Visits 13    Date for PT Re-Evaluation 03/20/23    Authorization Type MEDICARE PART B reporting period from 12/26/2022    Progress Note Due on Visit 10    PT Start Time 1647    PT Stop Time 1725    PT Time Calculation (min) 38 min    Activity Tolerance Patient limited by fatigue;Patient limited by pain;Patient tolerated treatment well    Behavior During Therapy [facility] for tasks assessed/performed               Past Medical History:  Diagnosis Date   Bowel trouble    urgency   Medical history non-contributory    Urinary urgency    Past Surgical History:  Procedure Laterality Date   BACK SURGERY  2010   CIRCUMCISION     LUMBAR LAMINECTOMY/DECOMPRESSION MICRODISCECTOMY  07/20/2011   Procedure: LUMBAR LAMINECTOMY/DECOMPRESSION MICRODISCECTOMY;  Surgeon: Carmela Hurt;  Location: [facility];  Service: Neurosurgery;  Laterality: N/A;  right thoracotomy with thoracic eight-nine discectomy and fusion   LUMBAR LAMINECTOMY/DECOMPRESSION MICRODISCECTOMY Right 10/06/2021   Procedure: Right Lumbar Four-Five Microdiscectomy, Right Lumbar Five-Sacal One Foraminotomy;  Surgeon: Coletta Memos, MD;  Location: [facility];  Service: Neurosurgery;  Laterality: Right;  3C/RM 21   THORACIC DISCECTOMY  07/16/2012   Procedure: THORACIC DISCECTOMY;  Surgeon: Carmela Hurt, MD;  Location: [facility];  Service: Neurosurgery;  Laterality: Right;  RIGHT Thoracic seven-eight  thoracic diskectomy via thoracotomy by dr Laneta Simmers   THORACIC DISCECTOMY N/A 12/15/2014   Procedure: THORACIC SEVEN TO THORACIC NINE Laminectomy ;  Surgeon: Coletta Memos, MD;  Location: [facility];  Service: Neurosurgery;  Laterality: N/A;   THORACIC DISCECTOMY N/A 06/06/2017   Procedure: LAMINECTOMY THORACIC  NINE-TEN;  Surgeon: Coletta Memos, MD;  Location: [facility] OR;  Service: Neurosurgery;  Laterality: N/A;  LAMINECTOMY THORACIC NINE-TEN   THORACOTOMY  07/20/2011   Procedure: THORACOTOMY OPEN FOR SPINE SURGERY;  Surgeon: Norton Blizzard, MD;  Location: [facility];  Service: Vascular;  Laterality: N/A;   THORACOTOMY  07/16/2012   Procedure: THORACOTOMY OPEN FOR SPINE SURGERY;  Surgeon: Alleen Borne, MD;  Location: [facility];  Service: Thoracic;  Laterality: N/A;   Patient Active Problem List   Diagnosis Date Noted   Nerve pain 12/05/2022   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   S/P lumbar spinal fusion 05/25/2022   Synovial cyst of lumbar facet joint 05/25/2022   HNP (herniated nucleus pulposus), lumbar 10/06/2021   Hand pain 06/28/2021   Lumbar facet arthropathy 01/11/2021   Thoracic spondylosis with myelopathy 01/11/2021   Thoracic spinal stenosis 06/06/2017   Stenosis, spinal, thoracic 12/15/2014   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 09/22/2014   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/2011    PCP: Nira Retort  REFERRING PROVIDER: Ranelle Oyster, MD   REFERRING DIAG: thoracic disc disease with myelopathy, nerve pain, adhesive arachnoiditis  Rationale for Evaluation and Treatment: Rehabilitation  THERAPY DIAG:  Other low back pain  Other symptoms and signs involving the nervous system  Radiculopathy, lumbar region  Muscle weakness (generalized)  Repeated falls  Difficulty in walking, not elsewhere classified  ONSET DATE:  Suddenly started having weakness 11 years ago, most recent episode of worsening  October 2022, s/p right L4-5 laminotomy, microdiscectomy on 10/06/2021, now s/p L4-5 PLIF by Dr. Franky Macho on 05/25/2022.  PERTINENT HISTORY:  Patient is a 42 y.o. male who presents to outpatient physical therapy with a referral for medical diagnosis thoracic disc disease with myelopathy, nerve pain, adhesive arachnoiditis. This  patient's chief complaints consist of disabling low back and R >L leg pain and weakness with activity, bowel and bladder urgency, incontinence, and retention, leading to the following functional deficits: difficulty with or unable to complete any activity that requires weight bearing, use of B LE, and/or balance including working, household and community mobility, walking, driving, going to family gatherings, avoiding incontinent episodes, playing with daughter, helping around the house, bed mobility, transfers. 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 Franky Macho on 05/25/2022, adhesive arachnoiditis, nerve pain.  Patient denies hx of cancer, stroke, seizures, lung problems, heart problems, diabetes, unexplained weight loss, and osteoporosis  SUBJECTIVE:                                                                                                                                                                                           SUBJECTIVE STATEMENT: Patient arrives on his Bari-RW. He states his pain has been worse since a few minutes after his last PT session. He states he saw Dr. Franky Macho yesterday who thought his nerves were "overworking or something" per patient and is planning to do a steroid injection next week. Patient states Dr. Franky Macho took another xray that showed nothing compressing on the nerves and that Dr. Franky Macho said he did not know what was causing the nerve degeneration on the EMG/NCV because nothing is pressing on the nerves. Patient has his injection appointment  scheduled with Dr. Sueanne Margarita office on 01/14/2023. Patient states both shoulders hurt a little bit in general from holding up his bodyweight so much.   PAIN:  NPRS: 7/10 low back and down bilateral legs to calves. Shooting pain.   PRECAUTIONS: Fall   PATIENT GOALS: "To get better, walk, drive again, climb stairs, play with my daughter, help around the house again"  NEXT MD VISIT: 01/14/2023 injection at Dr. Sueanne Margarita office. 01/30/2023 follow up with Dr. Riley Kill.   OBJECTIVE  SELF-REPORTED FUNCTION FOTO score: 30/100 (Inflammatory Diseases of the Nervous System questionnaire)   TODAY'S  TREATMENT:    Therapeutic exercise: to centralize symptoms and improve ROM, strength, muscular endurance, and activity tolerance required for successful completion of functional activities. - NuStep level 2 using bilateral upper and lower extremities. Seat/handle setting 11/10. For improved extremity mobility, muscular endurance, and activity tolerance; and to induce the analgesic effect of aerobic exercise, stimulate improved joint nutrition, and prepare body structures and systems for following interventions. x 5  minutes. Increased pain in bilateral proximal gastrocs. Using arms more than legs.  - Seated lumbar flexion roll out with physioball, 2x10 - seated sciatic nerve glide, slider technique with AAROM for LE, 2x10 each side (nerve glide, cuing for head motion).  - sit <> supine, mod I - hooklying heel slides, AROM, 1x10 each side (needs help stabilizing contralateral LE in hooklying without shoe on).  - hooklying short arc quad. AROM 1x10 each side, 2x10 each side (R 5/10#, L 10/15# - curl up with arms crossed over chest, 5x15 seconds - ambulation 2x50 feet, 1x100 feet down curb with bari-RW, SBA with chair follow.   Pt required multimodal cuing for proper technique and to facilitate improved neuromuscular control, strength, range of motion, and functional ability resulting in improved performance and  form.  PATIENT EDUCATION:  Education details: Education on diagnosis, prognosis, POC, anatomy and physiology of current condition. Article handout.  Person educated: patient Education method: Explanation Education comprehension: verbalized understanding and needs further education  HOME EXERCISE PROGRAM: Verbally:  - seated lumbar flexion stretch with head down and one LE extended using rollator, 1x10 each side.  - curl up with legs elevated, 5x15 seconds - sidelying open book 1x10 each side   ASSESSMENT:  CLINICAL IMPRESSION: Patient arrives using bari-RW reporting elevated pain since last PT session. Today's visit continued to focus on ROM, neuro-mobilization, and functional strength/mobility as tolerated. Patient continues to report pain but is motivated to complete exercises despite it. He continues to demonstrate weakness in the B LE that require therapist or UE assistance. Patient would benefit from continued management of limiting condition by skilled physical therapist to address remaining impairments and functional limitations to work towards stated goals and return to PLOF or maximal functional independence.    From initial PT Evaluation 12/26/2022:  Patient is a 42 y.o. male referred to outpatient physical therapy with a medical diagnosis of thoracic disc disease with myelopathy, nerve pain, adhesive arachnoiditis who presents with signs and symptoms consistent with chronic low back and thoracic spine pain and neurogenic weakness of B LE. Patient presents with significant pain, neurologic weakness, radiculopathy, balance, coordination, motor control, balance, sensation, bowel and bladder continence, muscle performance (strength/power/endurance), and activity tolerance impairments that are limiting ability to complete difficulty with or unable to complete any activity that requires weight bearing, use of B LE, and/or balance including working, household and community mobility, walking,  driving, going to family gatherings, avoiding incontinent episodes, playing with daughter, helping around the house, bed mobility, transfers without difficulty. Patient would benefit from a wheelchair to improve mobility and social participation. He would also benefit from an AFO for right foot drop to improve gait. Patient will benefit from skilled physical therapy intervention to address current body structure impairments and activity limitations to improve function and work towards goals set in current POC in order to return to prior level of function or maximal functional improvement.    OBJECTIVE IMPAIRMENTS: Abnormal gait, decreased activity tolerance, decreased balance, decreased coordination, decreased endurance, decreased knowledge of condition, decreased knowledge of use of DME, decreased mobility,  difficulty walking, decreased ROM, decreased strength, increased edema, impaired perceived functional ability, increased muscle spasms, impaired flexibility, impaired sensation, impaired tone, improper body mechanics, postural dysfunction, obesity, and pain.   ACTIVITY LIMITATIONS: carrying, lifting, bending, standing, squatting, stairs, transfers, bed mobility, continence, bathing, toileting, dressing, hygiene/grooming, locomotion level, and caring for others  PARTICIPATION LIMITATIONS: meal prep, cleaning, laundry, interpersonal relationship, driving, shopping, community activity, occupation, yard work, and   difficulty with or unable to complete any activity that requires weight bearing, use of B LE, and/or balance including working, household and community mobility, walking, driving, going to family gatherings, avoiding incontinent episodes, playing with daughter, helping around the house, bed mobility, transfers  PERSONAL FACTORS: Past/current experiences, Time since onset of injury/illness/exacerbation, and 3+ comorbidities:   3 thoracic spine surgeries, thoracic MRI notes "Myelomalacia with  severe cord atrophy from T7 through T9-10 and mild decreased volume the remainder of the thoracic cord, stable" s/p right L4-5 laminotomy, microdiscectomy on 10/06/2021, 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 Franky Macho on 05/25/2022, adhesive arachnoiditis, nerve pain are also affecting patient's functional outcome.   REHAB POTENTIAL: Fair due to severity and nature of condition.   CLINICAL DECISION MAKING: Evolving/moderate complexity  EVALUATION COMPLEXITY: Moderate   GOALS: Goals reviewed with patient? No  SHORT TERM GOALS: Target date: 01/09/2023  Patient will be independent with initial home exercise program for self-management of symptoms. Baseline: Initial HEP to be provided at visit 2 as appropriate (12/26/22); Goal status: INITIAL   LONG TERM GOALS: Target date: 03/20/2023  Patient will be independent with a long-term home exercise program for self-management of symptoms.  Baseline: Initial HEP to be provided at visit 2 as appropriate (12/26/22); Goal status: INITIAL  2.  Patient will demonstrate improved FOTO by equal or greater than 10 points by visit #13 to demonstrate improvement in overall condition and self-reported functional ability.  Baseline: to be tested visit 2 as appropriate (12/26/22); Goal status: INITIAL  3.  Patient will demonstrate the ability to ambulate equal or greater than 600 feet with LRAD during the 6 minute walk to improve his household and community mobility.  Baseline: 34 feet with bari-RW (12/26/22); Goal status: INITIAL  4.  Patient will complete 5 Time Sit To Stand Test from 19.5 inch surface or lower in equal or less than 15 seconds with no UE support to demonstrate improved transfer ability for improved household and toileting mobility.  Baseline: 23 seconds with heavy B UE support on RW from 19.5 inch plinth.  Pain throbbing down the right LE.  (12/26/22); Goal status: INITIAL  5.  Patient will report worst pain equal or less than 3/10 with functional activities to improve his ability to complete basic household and community mobility. Baseline: up to 8/10 (12/26/22); Goal status: INITIAL   PLAN:  PT FREQUENCY: 1-2x/week  PT DURATION: 12 weeks  PLANNED INTERVENTIONS: Therapeutic exercises, Therapeutic activity, Neuromuscular re-education, Balance training, Gait training, Patient/Family education, Self Care, Joint mobilization, Stair training, Orthotic/Fit training, DME instructions, Aquatic Therapy, Dry Needling, Electrical stimulation, Wheelchair mobility training, Spinal mobilization, Cryotherapy, Moist heat, Manual therapy, and Re-evaluation.  PLAN FOR NEXT SESSION: update HEP as appropriate, neurodynamics, gait training, LE/core/functional strengthening, stretching, and balance as tolerated, education, manual therapy as needed.   Cira Rue, PT, DPT 01/02/2023, 6:05 PM   [facility] [address] [place], [place] [address] P:  [phone] I F: [phone]

## 2023-01-10 ENCOUNTER — Ambulatory Visit: Payer: Medicare Other | Admitting: Physical Therapy

## 2023-01-10 ENCOUNTER — Encounter: Payer: Self-pay | Admitting: Physical Therapy

## 2023-01-10 DIAGNOSIS — M5416 Radiculopathy, lumbar region: Secondary | ICD-10-CM

## 2023-01-10 DIAGNOSIS — M5459 Other low back pain: Secondary | ICD-10-CM

## 2023-01-10 DIAGNOSIS — R29818 Other symptoms and signs involving the nervous system: Secondary | ICD-10-CM

## 2023-01-10 DIAGNOSIS — R262 Difficulty in walking, not elsewhere classified: Secondary | ICD-10-CM

## 2023-01-10 DIAGNOSIS — R296 Repeated falls: Secondary | ICD-10-CM

## 2023-01-10 DIAGNOSIS — M6281 Muscle weakness (generalized): Secondary | ICD-10-CM

## 2023-01-10 NOTE — Therapy (Signed)
OUTPATIENT PHYSICAL THERAPY TREATMENT NOTE   Patient Name: Travis Fitzpatrick MRN: [id number] DOB:06/29/1981, 42 y.o., male Today's Date: 01/10/23   END OF SESSION:  PT End of Session - 01/10/23 1747     Visit Number 4    Number of Visits 13    Date for PT Re-Evaluation 03/20/23    Authorization Type MEDICARE PART B reporting period from 12/26/2022    Progress Note Due on Visit 10    PT Start Time 1648    PT Stop Time 1730    PT Time Calculation (min) 42 min    Activity Tolerance Patient limited by fatigue;Patient limited by pain;Patient tolerated treatment well    Behavior During Therapy [facility] for tasks assessed/performed                Past Medical History:  Diagnosis Date   Bowel trouble    urgency   Medical history non-contributory    Urinary urgency    Past Surgical History:  Procedure Laterality Date   BACK SURGERY  2010   CIRCUMCISION     LUMBAR LAMINECTOMY/DECOMPRESSION MICRODISCECTOMY  07/20/2011   Procedure: LUMBAR LAMINECTOMY/DECOMPRESSION MICRODISCECTOMY;  Surgeon: Carmela Hurt;  Location: [facility];  Service: Neurosurgery;  Laterality: N/A;  right thoracotomy with thoracic eight-nine discectomy and fusion   LUMBAR LAMINECTOMY/DECOMPRESSION MICRODISCECTOMY Right 10/06/2021   Procedure: Right Lumbar Four-Five Microdiscectomy, Right Lumbar Five-Sacal One Foraminotomy;  Surgeon: Coletta Memos, MD;  Location: [facility];  Service: Neurosurgery;  Laterality: Right;  3C/RM 21   THORACIC DISCECTOMY  07/16/2012   Procedure: THORACIC DISCECTOMY;  Surgeon: Carmela Hurt, MD;  Location: [facility];  Service: Neurosurgery;  Laterality: Right;  RIGHT Thoracic seven-eight  thoracic diskectomy via thoracotomy by dr Laneta Simmers   THORACIC DISCECTOMY N/A 12/15/2014   Procedure: THORACIC SEVEN TO THORACIC NINE Laminectomy ;  Surgeon: Coletta Memos, MD;  Location: [facility];  Service: Neurosurgery;  Laterality: N/A;   THORACIC DISCECTOMY N/A 06/06/2017   Procedure: LAMINECTOMY  THORACIC NINE-TEN;  Surgeon: Coletta Memos, MD;  Location: [facility];  Service: Neurosurgery;  Laterality: N/A;  LAMINECTOMY THORACIC NINE-TEN   THORACOTOMY  07/20/2011   Procedure: THORACOTOMY OPEN FOR SPINE SURGERY;  Surgeon: Norton Blizzard, MD;  Location: [facility];  Service: Vascular;  Laterality: N/A;   THORACOTOMY  07/16/2012   Procedure: THORACOTOMY OPEN FOR SPINE SURGERY;  Surgeon: Alleen Borne, MD;  Location: [facility];  Service: Thoracic;  Laterality: N/A;   Patient Active Problem List   Diagnosis Date Noted   Nerve pain 12/05/2022   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   S/P lumbar spinal fusion 05/25/2022   Synovial cyst of lumbar facet joint 05/25/2022   HNP (herniated nucleus pulposus), lumbar 10/06/2021   Hand pain 06/28/2021   Lumbar facet arthropathy 01/11/2021   Thoracic spondylosis with myelopathy 01/11/2021   Thoracic spinal stenosis 06/06/2017   Stenosis, spinal, thoracic 12/15/2014   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 09/22/2014   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/2011    PCP: Nira Retort  REFERRING PROVIDER: Ranelle Oyster, MD   REFERRING DIAG: thoracic disc disease with myelopathy, nerve pain, adhesive arachnoiditis  Rationale for Evaluation and Treatment: Rehabilitation  THERAPY DIAG:  Other low back pain  Other symptoms and signs involving the nervous system  Radiculopathy, lumbar region  Muscle weakness (generalized)  Repeated falls  Difficulty in walking, not elsewhere classified  ONSET DATE:  Suddenly started having weakness 11 years ago, most recent episode  of worsening October 2022, s/p right L4-5 laminotomy, microdiscectomy on 10/06/2021, now s/p L4-5 PLIF by Dr. Franky Macho on 05/25/2022.  PERTINENT HISTORY:  Patient is a 42 y.o. male who presents to outpatient physical therapy with a referral for medical diagnosis thoracic disc disease with myelopathy, nerve pain, adhesive arachnoiditis.  This patient's chief complaints consist of disabling low back and R >L leg pain and weakness with activity, bowel and bladder urgency, incontinence, and retention, leading to the following functional deficits: difficulty with or unable to complete any activity that requires weight bearing, use of B LE, and/or balance including working, household and community mobility, walking, driving, going to family gatherings, avoiding incontinent episodes, playing with daughter, helping around the house, bed mobility, transfers. 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 Franky Macho on 05/25/2022, adhesive arachnoiditis, nerve pain.  Patient denies hx of cancer, stroke, seizures, lung problems, heart problems, diabetes, unexplained weight loss, and osteoporosis  SUBJECTIVE:                                                                                                                                                                                           SUBJECTIVE STATEMENT: Patient arrives on his Bari-RW. He states he has been doing better and has been able to stand without UE support briefly. He has his injection Monday June 3rd. He states his HEP is going well and he has been practicing walking. He has been using a heating pad at night.   PAIN:  NPRS: 5/10 low back right posterior hip to calf and left lateral thigh down to calf.   PRECAUTIONS: Fall   PATIENT GOALS: "To get better, walk, drive again, climb stairs, play with my daughter, help around the house again"  NEXT MD VISIT: 01/14/2023  injection at Dr. Sueanne Margarita office. 01/30/2023 follow up with Dr. Riley Kill.   OBJECTIVE   TODAY'S TREATMENT:    Therapeutic exercise: to centralize symptoms and improve ROM, strength, muscular endurance, and activity tolerance required for successful completion of functional activities. - NuStep level 3 using bilateral upper and lower extremities. Seat/handle setting 11/10. For improved extremity mobility, muscular endurance, and activity tolerance; and to induce the analgesic effect of aerobic exercise, stimulate improved joint nutrition, and prepare body structures and systems for following interventions. x 6  minutes. Increased pain in right hip. Average SPM = 55.  - Seated lumbar flexion roll out with physioball, 1x2 min - seated sciatic nerve glide, slider technique with AAROM for LE, 2x10 each side (nerve glide, cuing for head motion).  - sit <> supine, mod I - curl up with arms crossed over chest, 5x30 seconds (small ball squeeze between knees for last 4 seconds).  - hooklying glute set, 1x10 - hooklying LTR with wide feet with hip ER/ER, 2x10 each side.  - hooklying AROM: 1x10 each side except ASLR 2x5 each side.  heel slides, hip abduction with knees extended, ASLR (needs help stabilizing contralateral LE in hooklying without shoe on).  - Seated lumbar flexion roll out with physioball, 1x2 min - ambulation 1x120 feet down curb with bari-RW, SBA with chair follow.   Pt required multimodal cuing for proper technique and to facilitate improved neuromuscular control, strength, range of motion, and functional ability resulting in improved performance and form.  PATIENT EDUCATION:  Education details: Education on diagnosis, prognosis, POC, anatomy and physiology of current condition. Article handout.  Person educated: patient Education method: Explanation Education comprehension: verbalized understanding and needs further education  HOME EXERCISE PROGRAM: Verbally:  - seated lumbar flexion  stretch with head down and one LE extended using rollator, 1x10 each side.  - curl up with legs elevated, 5x15 seconds - sidelying open book 1x10 each side   ASSESSMENT:  CLINICAL IMPRESSION: Patient arrives using bari-RW reporting slightly improved pain and function since last PT session. Patient had significant pain today with exercises that was partially relieved by seated lumbar roll out. He was able to ambulate all the way to the vehicle today but reported a lot of pain down the back of both legs. Session continues to focus on neural and spinal/LE mobility and strength as tolerated. Patient would benefit from continued management of limiting condition by skilled physical therapist to address remaining impairments and functional limitations to work towards stated goals and return to PLOF or maximal functional independence.    From initial PT Evaluation 12/26/2022:  Patient is a 42 y.o. male referred to outpatient physical therapy with a medical diagnosis of thoracic disc disease with myelopathy, nerve pain, adhesive arachnoiditis who presents with signs and symptoms consistent with chronic low back and thoracic spine pain and neurogenic weakness of B LE. Patient presents with significant pain, neurologic weakness, radiculopathy, balance, coordination, motor control, balance, sensation, bowel and bladder continence, muscle performance (strength/power/endurance), and activity tolerance impairments that are limiting ability to complete difficulty with or unable to complete any activity that requires weight bearing, use of B LE, and/or balance including working, household and community mobility, walking, driving, going to family gatherings, avoiding incontinent episodes, playing with daughter, helping around the house, bed mobility, transfers without difficulty. Patient would benefit from a wheelchair to improve mobility and social participation. He would also benefit from an AFO for right foot drop to  improve gait. Patient will benefit from skilled physical therapy intervention to address current body structure impairments and activity limitations to improve function and work towards goals set in current POC in order to return to prior level of function or maximal functional improvement.    OBJECTIVE IMPAIRMENTS: Abnormal gait, decreased activity tolerance, decreased balance, decreased coordination, decreased endurance, decreased knowledge of condition, decreased knowledge of use of DME, decreased mobility, difficulty walking, decreased ROM, decreased strength, increased edema, impaired perceived functional ability, increased muscle spasms, impaired flexibility, impaired sensation, impaired tone, improper body mechanics, postural dysfunction,  obesity, and pain.   ACTIVITY LIMITATIONS: carrying, lifting, bending, standing, squatting, stairs, transfers, bed mobility, continence, bathing, toileting, dressing, hygiene/grooming, locomotion level, and caring for others  PARTICIPATION LIMITATIONS: meal prep, cleaning, laundry, interpersonal relationship, driving, shopping, community activity, occupation, yard work, and   difficulty with or unable to complete any activity that requires weight bearing, use of B LE, and/or balance including working, household and community mobility, walking, driving, going to family gatherings, avoiding incontinent episodes, playing with daughter, helping around the house, bed mobility, transfers  PERSONAL FACTORS: Past/current experiences, Time since onset of injury/illness/exacerbation, and 3+ comorbidities:   3 thoracic spine surgeries, thoracic MRI notes "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 Franky Macho on 05/25/2022, adhesive arachnoiditis, nerve pain are also affecting patient's functional outcome.   REHAB POTENTIAL: Fair due to severity and nature of condition.   CLINICAL DECISION MAKING: Evolving/moderate complexity  EVALUATION COMPLEXITY: Moderate   GOALS: Goals reviewed with patient? No  SHORT TERM GOALS: Target date: 01/09/2023  Patient will be independent with initial home exercise program for self-management of symptoms. Baseline: Initial HEP to be provided at visit 2 as appropriate (12/26/22); Goal status: INITIAL   LONG TERM GOALS: Target date: 03/20/2023  Patient will be independent with a long-term home exercise program for self-management of symptoms.  Baseline: Initial HEP to be provided at visit 2 as appropriate (12/26/22); Goal status: INITIAL  2.  Patient will demonstrate improved FOTO by equal or greater than 10 points by visit #13 to demonstrate improvement in overall condition and self-reported functional ability.  Baseline: to be tested visit 2 as appropriate (12/26/22); Goal status: INITIAL  3.  Patient will demonstrate the ability to ambulate equal or greater than 600 feet with LRAD during the 6 minute walk to improve his household and community mobility.  Baseline: 34 feet with bari-RW (12/26/22); Goal status: INITIAL  4.  Patient will complete 5 Time Sit To Stand Test from 19.5 inch surface or lower in equal or less than 15 seconds with no UE support to demonstrate improved transfer ability for improved household and toileting mobility.  Baseline: 23 seconds with heavy B UE support on RW from 19.5 inch plinth. Pain throbbing down the right LE.  (12/26/22); Goal status: INITIAL  5.  Patient will report worst pain equal or less than 3/10 with functional activities to improve his ability to complete basic household and community mobility. Baseline: up to 8/10 (12/26/22); Goal status: INITIAL   PLAN:  PT  FREQUENCY: 1-2x/week  PT DURATION: 12 weeks  PLANNED INTERVENTIONS: Therapeutic exercises, Therapeutic activity, Neuromuscular re-education, Balance training, Gait training, Patient/Family education, Self Care, Joint mobilization, Stair training, Orthotic/Fit training, DME instructions, Aquatic Therapy, Dry Needling, Electrical stimulation, Wheelchair mobility training, Spinal mobilization, Cryotherapy, Moist heat, Manual therapy, and Re-evaluation.  PLAN FOR NEXT SESSION: update HEP as appropriate, neurodynamics, gait training, LE/core/functional strengthening, stretching, and balance as tolerated, education, manual therapy as needed.   Cira Rue, PT, DPT 01/10/2023, 5:51 PM   [facility] [address] [place], [place] [address] P: [phone] I F: [phone]

## 2023-01-15 ENCOUNTER — Ambulatory Visit: Payer: Medicare Other | Admitting: Physical Therapy

## 2023-01-17 ENCOUNTER — Encounter: Payer: Self-pay | Admitting: Physical Therapy

## 2023-01-17 ENCOUNTER — Ambulatory Visit: Payer: Medicare Other | Attending: Physical Medicine & Rehabilitation | Admitting: Physical Therapy

## 2023-01-17 DIAGNOSIS — M5416 Radiculopathy, lumbar region: Secondary | ICD-10-CM | POA: Diagnosis present

## 2023-01-17 DIAGNOSIS — M5459 Other low back pain: Secondary | ICD-10-CM | POA: Diagnosis present

## 2023-01-17 DIAGNOSIS — R262 Difficulty in walking, not elsewhere classified: Secondary | ICD-10-CM

## 2023-01-17 DIAGNOSIS — M6281 Muscle weakness (generalized): Secondary | ICD-10-CM | POA: Diagnosis present

## 2023-01-17 DIAGNOSIS — R29818 Other symptoms and signs involving the nervous system: Secondary | ICD-10-CM | POA: Diagnosis present

## 2023-01-17 DIAGNOSIS — R296 Repeated falls: Secondary | ICD-10-CM

## 2023-01-17 NOTE — Therapy (Signed)
OUTPATIENT PHYSICAL THERAPY TREATMENT NOTE   Patient Name: Travis Fitzpatrick MRN: [id number] DOB:Feb 06, 1981, 42 y.o., male Today's Date: 01/17/23   END OF SESSION:  PT End of Session - 01/17/23 1657     Visit Number 5    Number of Visits 13    Date for PT Re-Evaluation 03/20/23    Authorization Type MEDICARE PART B reporting period from 12/26/2022    Progress Note Due on Visit 10    PT Start Time 1648    PT Stop Time 1728    PT Time Calculation (min) 40 min    Activity Tolerance Patient limited by fatigue;Patient limited by pain;Patient tolerated treatment well    Behavior During Therapy [facility] for tasks assessed/performed              Past Medical History:  Diagnosis Date   Bowel trouble    urgency   Medical history non-contributory    Urinary urgency    Past Surgical History:  Procedure Laterality Date   BACK SURGERY  2010   CIRCUMCISION     LUMBAR LAMINECTOMY/DECOMPRESSION MICRODISCECTOMY  07/20/2011   Procedure: LUMBAR LAMINECTOMY/DECOMPRESSION MICRODISCECTOMY;  Surgeon: Carmela Hurt;  Location: [facility];  Service: Neurosurgery;  Laterality: N/A;  right thoracotomy with thoracic eight-nine discectomy and fusion   LUMBAR LAMINECTOMY/DECOMPRESSION MICRODISCECTOMY Right 10/06/2021   Procedure: Right Lumbar Four-Five Microdiscectomy, Right Lumbar Five-Sacal One Foraminotomy;  Surgeon: Coletta Memos, MD;  Location: [facility];  Service: Neurosurgery;  Laterality: Right;  3C/RM 21   THORACIC DISCECTOMY  07/16/2012   Procedure: THORACIC DISCECTOMY;  Surgeon: Carmela Hurt, MD;  Location: [facility];  Service: Neurosurgery;  Laterality: Right;  RIGHT Thoracic seven-eight  thoracic diskectomy via thoracotomy by dr Laneta Simmers   THORACIC DISCECTOMY N/A 12/15/2014   Procedure: THORACIC SEVEN TO THORACIC NINE Laminectomy ;  Surgeon: Coletta Memos, MD;  Location: [facility];  Service: Neurosurgery;  Laterality: N/A;   THORACIC DISCECTOMY N/A 06/06/2017   Procedure: LAMINECTOMY THORACIC  NINE-TEN;  Surgeon: Coletta Memos, MD;  Location: [facility];  Service: Neurosurgery;  Laterality: N/A;  LAMINECTOMY THORACIC NINE-TEN   THORACOTOMY  07/20/2011   Procedure: THORACOTOMY OPEN FOR SPINE SURGERY;  Surgeon: Norton Blizzard, MD;  Location: [facility];  Service: Vascular;  Laterality: N/A;   THORACOTOMY  07/16/2012   Procedure: THORACOTOMY OPEN FOR SPINE SURGERY;  Surgeon: Alleen Borne, MD;  Location: [facility];  Service: Thoracic;  Laterality: N/A;   Patient Active Problem List   Diagnosis Date Noted   Nerve pain 12/05/2022   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   S/P lumbar spinal fusion 05/25/2022   Synovial cyst of lumbar facet joint 05/25/2022   HNP (herniated nucleus pulposus), lumbar 10/06/2021   Hand pain 06/28/2021   Lumbar facet arthropathy 01/11/2021   Thoracic spondylosis with myelopathy 01/11/2021   Thoracic spinal stenosis 06/06/2017   Stenosis, spinal, thoracic 12/15/2014   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 09/22/2014   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/2011    PCP: Nira Retort  REFERRING PROVIDER: Ranelle Oyster, MD   REFERRING DIAG: thoracic disc disease with myelopathy, nerve pain, adhesive arachnoiditis  Rationale for Evaluation and Treatment: Rehabilitation  THERAPY DIAG:  Other low back pain  Other symptoms and signs involving the nervous system  Radiculopathy, lumbar region  Muscle weakness (generalized)  Repeated falls  Difficulty in walking, not elsewhere classified  ONSET DATE:  Suddenly started having weakness 11 years ago, most recent episode of worsening  October 2022, s/p right L4-5 laminotomy, microdiscectomy on 10/06/2021, now s/p L4-5 PLIF by Dr. Franky Macho on 05/25/2022.  PERTINENT HISTORY:  Patient is a 42 y.o. male who presents to outpatient physical therapy with a referral for medical diagnosis thoracic disc disease with myelopathy, nerve pain, adhesive arachnoiditis. This  patient's chief complaints consist of disabling low back and R >L leg pain and weakness with activity, bowel and bladder urgency, incontinence, and retention, leading to the following functional deficits: difficulty with or unable to complete any activity that requires weight bearing, use of B LE, and/or balance including working, household and community mobility, walking, driving, going to family gatherings, avoiding incontinent episodes, playing with daughter, helping around the house, bed mobility, transfers. 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 Franky Macho on 05/25/2022, adhesive arachnoiditis, nerve pain.  Patient denies hx of cancer, stroke, seizures, lung problems, heart problems, diabetes, unexplained weight loss, and osteoporosis  SUBJECTIVE:                                                                                                                                                                                           SUBJECTIVE STATEMENT: Patient arrives on his Bari-RW. He states he has felt better since getting a steroid shot on Monday. He states he had a lot of pain after last PT session that lasted until the next Monday. He could get out of bed on Saturday but not walk much. Today he states there is less shooting pain and he can move freely a little better. He is not able to do any heavy lifting. He feels like the injection made more of a difference on the right side but the pain on the left is about the same. Patient states he has a urologist appointment in [place] at 1pm on  01/31/2023 and he thinks he should be able to get to his PT appointment at 5:30pm that day. Patient agreed to call PT clinic to let them know if he is unable to make it. He missed his nieces' middle school graduation last week because he could not walk far enough.   PAIN:  NPRS: 4/10 low back down left leg, and down right leg to calf (not as bad as the left).    PRECAUTIONS: Fall   PATIENT GOALS: "  To get better, walk, drive again, climb stairs, play with my daughter, help around the house again"  NEXT MD VISIT: 01/30/2023 follow up with Dr. Riley Kill.   OBJECTIVE  TODAY'S TREATMENT:    Therapeutic exercise: to centralize symptoms and improve ROM, strength, muscular endurance, and activity tolerance required for successful completion of functional activities. - NuStep level 3 using bilateral upper and lower extremities. Seat/handle setting 11/10. For improved extremity mobility, muscular endurance, and activity tolerance; and to induce the analgesic effect of aerobic exercise, stimulate improved joint nutrition, and prepare body structures and systems for following interventions. x 10 minutes. Average SPM = 60.  - Seated lumbar flexion roll out with physioball, 1x3 min - sit <> supine, mod I - hooklying heel slide AROM, 3x10 each side (painful on left) - hooklying SAQ with 10#AW on R LE and 15#AW on L LE, 4x10 on right, 3x10 on left. Both sides tested with AROM and R side hurt with AROM but not loaded and L LE hurt with AROM and no worse with load (worsening with reps).  - hooklying B hip adduction against small blue theraball, 1x5 with 5 second holds (developed R glute and posterior thigh pain that stopped him from continuing exercise and he sat up).  - Seated lumbar flexion roll out with physioball, 1x5 min (pain easing) - ambulation 1x100 feet in clinic with bari-RW, SBA with chair follow - ambulation 1x100 feet down curb with bari-RW, SBA with chair follow.   Pt required multimodal cuing for  proper technique and to facilitate improved neuromuscular control, strength, range of motion, and functional ability resulting in improved performance and form.  PATIENT EDUCATION:  Education details: Education on diagnosis, prognosis, POC, anatomy and physiology of current condition. Article handout.  Person educated: patient Education method: Explanation Education comprehension: verbalized understanding and needs further education  HOME EXERCISE PROGRAM: Verbally:  - seated lumbar flexion stretch with head down and one LE extended using rollator, 1x10 each side.  - curl up with legs elevated, 5x15 seconds - sidelying open book 1x10 each side   ASSESSMENT:  CLINICAL IMPRESSION: Patient arrives using bari-RW reporting mobility-limiting pain for several days after last PT session but improved pain since his injection on Monday. Today's session continued to focus on improving LE strength and spine/hip/neural mobility to improve functional activity tolerance. Exercises were chosen to be less irritating than at last PT session, but patient still had to discontinue mat exercises due to increased pain in the right glute and back of thigh. Patient reported less pain by end of session than at end of last PT session and was able to ambulate with his RW more confidently to his his vehicle. Patient would benefit from wheelchair evaluation and adaptive driving evaluation to help him more fully participate in his life. Patient would benefit from continued management of limiting condition by skilled physical therapist to address remaining impairments and functional limitations to work towards stated goals and return to PLOF or maximal functional independence.   From initial PT Evaluation 12/26/2022:  Patient is a 42 y.o. male referred to outpatient physical therapy with a medical diagnosis of thoracic disc disease with myelopathy, nerve pain, adhesive arachnoiditis who presents with signs and symptoms  consistent with chronic low back and thoracic spine pain and neurogenic weakness of B LE. Patient presents with significant pain, neurologic weakness, radiculopathy, balance, coordination, motor control, balance, sensation, bowel and bladder continence, muscle performance (strength/power/endurance), and activity tolerance impairments that are limiting ability to complete difficulty with  or unable to complete any activity that requires weight bearing, use of B LE, and/or balance including working, household and community mobility, walking, driving, going to family gatherings, avoiding incontinent episodes, playing with daughter, helping around the house, bed mobility, transfers without difficulty. Patient would benefit from a wheelchair to improve mobility and social participation. He would also benefit from an AFO for right foot drop to improve gait. Patient will benefit from skilled physical therapy intervention to address current body structure impairments and activity limitations to improve function and work towards goals set in current POC in order to return to prior level of function or maximal functional improvement.    OBJECTIVE IMPAIRMENTS: Abnormal gait, decreased activity tolerance, decreased balance, decreased coordination, decreased endurance, decreased knowledge of condition, decreased knowledge of use of DME, decreased mobility, difficulty walking, decreased ROM, decreased strength, increased edema, impaired perceived functional ability, increased muscle spasms, impaired flexibility, impaired sensation, impaired tone, improper body mechanics, postural dysfunction, obesity, and pain.   ACTIVITY LIMITATIONS: carrying, lifting, bending, standing, squatting, stairs, transfers, bed mobility, continence, bathing, toileting, dressing, hygiene/grooming, locomotion level, and caring for others  PARTICIPATION LIMITATIONS: meal prep, cleaning, laundry, interpersonal relationship, driving, shopping,  community activity, occupation, yard work, and   difficulty with or unable to complete any activity that requires weight bearing, use of B LE, and/or balance including working, household and community mobility, walking, driving, going to family gatherings, avoiding incontinent episodes, playing with daughter, helping around the house, bed mobility, transfers  PERSONAL FACTORS: Past/current experiences, Time since onset of injury/illness/exacerbation, and 3+ comorbidities:   3 thoracic spine surgeries, thoracic MRI notes "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 Franky Macho on 05/25/2022, adhesive arachnoiditis, nerve pain are also affecting patient's functional outcome.   REHAB POTENTIAL: Fair due to severity and nature of condition.   CLINICAL DECISION MAKING: Evolving/moderate complexity  EVALUATION COMPLEXITY: Moderate   GOALS: Goals reviewed with patient? No  SHORT TERM GOALS: Target date: 01/09/2023  Patient will be independent with initial home exercise program for self-management of symptoms. Baseline: Initial HEP to be provided at visit 2 as appropriate (12/26/22); Goal status: In-progress   LONG TERM GOALS: Target date: 03/20/2023  Patient will be independent with a long-term home exercise program for self-management of symptoms.  Baseline: Initial HEP to be provided at visit 2 as appropriate (12/26/22); Goal status: In-progress  2.  Patient will demonstrate improved FOTO by equal or greater than 10 points by visit #13 to demonstrate improvement in overall condition and self-reported functional ability.  Baseline: to be tested visit 2 as appropriate (12/26/22); Goal  status: In-progress  3.  Patient will demonstrate the ability to ambulate equal or greater than 600 feet with LRAD during the 6 minute walk to improve his household and community mobility.  Baseline: 34 feet with bari-RW (12/26/22); Goal status: In-progress  4.  Patient will complete 5 Time Sit To Stand Test from 19.5 inch surface or lower in equal or less than 15 seconds with no UE support to demonstrate improved transfer ability for improved household and toileting mobility.  Baseline: 23 seconds with heavy B UE support on RW from 19.5 inch plinth. Pain throbbing down the right LE.  (12/26/22); Goal status: In-progress  5.  Patient will report worst pain equal or less than 3/10 with functional activities to improve his ability to complete basic household and community mobility. Baseline: up to 8/10 (12/26/22); Goal status: In-progress   PLAN:  PT FREQUENCY: 1-2x/week  PT DURATION: 12 weeks  PLANNED INTERVENTIONS: Therapeutic exercises, Therapeutic activity, Neuromuscular re-education, Balance training, Gait training, Patient/Family education, Self Care, Joint mobilization, Stair training, Orthotic/Fit training, DME instructions, Aquatic Therapy, Dry Needling, Electrical stimulation, Wheelchair mobility training, Spinal mobilization, Cryotherapy, Moist heat, Manual therapy, and Re-evaluation.  PLAN FOR NEXT SESSION: update HEP as appropriate, neurodynamics, gait training, LE/core/functional strengthening, stretching, and balance as tolerated, education, manual therapy as needed.   Cira Rue, PT, DPT 01/17/2023, 5:54 PM   [facility] [address] [place], [place] [address] P: [phone] I F: [phone]

## 2023-01-22 ENCOUNTER — Ambulatory Visit: Payer: Medicare Other | Admitting: Physical Therapy

## 2023-01-24 ENCOUNTER — Ambulatory Visit: Payer: Medicare Other | Admitting: Physical Therapy

## 2023-01-29 ENCOUNTER — Ambulatory Visit: Payer: Medicare Other | Admitting: Physical Therapy

## 2023-01-30 ENCOUNTER — Encounter: Payer: Medicare Other | Admitting: Physical Medicine & Rehabilitation

## 2023-01-31 ENCOUNTER — Encounter: Payer: Self-pay | Admitting: Physical Therapy

## 2023-01-31 ENCOUNTER — Ambulatory Visit: Payer: Medicare Other | Admitting: Physical Therapy

## 2023-01-31 VITALS — BP 125/77 | HR 110

## 2023-01-31 DIAGNOSIS — R29818 Other symptoms and signs involving the nervous system: Secondary | ICD-10-CM

## 2023-01-31 DIAGNOSIS — M5459 Other low back pain: Secondary | ICD-10-CM

## 2023-01-31 DIAGNOSIS — R262 Difficulty in walking, not elsewhere classified: Secondary | ICD-10-CM

## 2023-01-31 DIAGNOSIS — M5416 Radiculopathy, lumbar region: Secondary | ICD-10-CM

## 2023-01-31 DIAGNOSIS — M6281 Muscle weakness (generalized): Secondary | ICD-10-CM

## 2023-01-31 DIAGNOSIS — R296 Repeated falls: Secondary | ICD-10-CM

## 2023-01-31 NOTE — Therapy (Signed)
OUTPATIENT PHYSICAL THERAPY TREATMENT NOTE / RE-EVALUATION Dates of reporting from 12/26/2022 to 6/20/20204 Reason for RE-Evaluation: change in medical status, patient was hospitalized and discharged from the hospital   Patient Name: Travis Fitzpatrick MRN: [id number] DOB:[birth year], 42 y.o., male Today's Date: 01/31/23   END OF SESSION:  PT End of Session - 01/31/23 1802     Visit Number 6    Number of Visits 13    Date for PT Re-Evaluation 03/20/23    Authorization Type MEDICARE PART B reporting period from 12/26/2022    Progress Note Due on Visit 10    PT Start Time 1735    PT Stop Time 1815    PT Time Calculation (min) 40 min    Activity Tolerance Patient limited by fatigue;Patient limited by pain    Behavior During Therapy [facility] for tasks assessed/performed               Past Medical History:  Diagnosis Date   Bowel trouble    urgency   Medical history non-contributory    Urinary urgency    Past Surgical History:  Procedure Laterality Date   BACK SURGERY  2010   CIRCUMCISION     LUMBAR LAMINECTOMY/DECOMPRESSION MICRODISCECTOMY  07/20/2011   Procedure: LUMBAR LAMINECTOMY/DECOMPRESSION MICRODISCECTOMY;  Surgeon: Carmela Hurt;  Location: [facility];  Service: Neurosurgery;  Laterality: N/A;  right thoracotomy with thoracic eight-nine discectomy and fusion   LUMBAR LAMINECTOMY/DECOMPRESSION MICRODISCECTOMY Right 10/06/2021   Procedure: Right Lumbar Four-Five Microdiscectomy, Right Lumbar Five-Sacal One Foraminotomy;  Surgeon: Coletta Memos, MD;  Location: [facility];  Service: Neurosurgery;  Laterality: Right;  3C/RM 21   THORACIC DISCECTOMY  07/16/2012   Procedure: THORACIC DISCECTOMY;  Surgeon: Carmela Hurt, MD;  Location: [facility];  Service: Neurosurgery;  Laterality: Right;  RIGHT Thoracic seven-eight  thoracic diskectomy via thoracotomy by dr Laneta Simmers   THORACIC DISCECTOMY N/A 12/15/2014   Procedure: THORACIC SEVEN TO THORACIC NINE Laminectomy ;  Surgeon: Coletta Memos,  MD;  Location: [facility];  Service: Neurosurgery;  Laterality: N/A;   THORACIC DISCECTOMY N/A 06/06/2017   Procedure: LAMINECTOMY THORACIC NINE-TEN;  Surgeon: Coletta Memos, MD;  Location: [facility];  Service: Neurosurgery;  Laterality: N/A;  LAMINECTOMY THORACIC NINE-TEN   THORACOTOMY  07/20/2011   Procedure: THORACOTOMY OPEN FOR SPINE SURGERY;  Surgeon: Norton Blizzard, MD;  Location: [facility];  Service: Vascular;  Laterality: N/A;   THORACOTOMY  07/16/2012   Procedure: THORACOTOMY OPEN FOR SPINE SURGERY;  Surgeon: Alleen Borne, MD;  Location: [facility];  Service: Thoracic;  Laterality: N/A;   Patient Active Problem List   Diagnosis Date Noted   Nerve pain 12/05/2022   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   S/P lumbar spinal fusion 05/25/2022   Synovial cyst of lumbar facet joint 05/25/2022   HNP (herniated nucleus pulposus), lumbar 10/06/2021   Hand pain 06/28/2021   Lumbar facet arthropathy 01/11/2021   Thoracic spondylosis with myelopathy 01/11/2021   Thoracic spinal stenosis 06/06/2017   Stenosis, spinal, thoracic 12/15/2014   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 09/22/2014   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/2011    PCP: Nira Retort  REFERRING PROVIDER: Ranelle Oyster, MD   REFERRING DIAG: thoracic disc disease with myelopathy, nerve pain, adhesive arachnoiditis  Rationale for Evaluation and Treatment: Rehabilitation  THERAPY DIAG:  Other low back pain  Other symptoms and signs involving the nervous system  Radiculopathy, lumbar region  Muscle weakness (generalized)  Repeated falls  Difficulty in walking, not elsewhere classified  ONSET DATE:  Suddenly started having weakness 11 years ago, most recent episode of worsening October 2022, s/p right L4-5 laminotomy, microdiscectomy on 10/06/2021, now s/p L4-5 PLIF by Dr. Franky Macho on 05/25/2022.  PERTINENT HISTORY:  Patient is a 42 y.o. male who presents to  outpatient physical therapy with a referral for medical diagnosis thoracic disc disease with myelopathy, nerve pain, adhesive arachnoiditis. This patient's chief complaints consist of disabling low back and R >L leg pain and weakness with activity, bowel and bladder urgency, incontinence, and retention, leading to the following functional deficits: difficulty with or unable to complete any activity that requires weight bearing, use of B LE, and/or balance including working, household and community mobility, walking, driving, going to family gatherings, avoiding incontinent episodes, playing with daughter, helping around the house, bed mobility, transfers. 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 Franky Macho on 05/25/2022, adhesive arachnoiditis, nerve pain.  Patient denies hx of cancer, stroke, seizures, lung problems, heart problems, diabetes, unexplained weight loss, and osteoporosis  SUBJECTIVE:                                                                                                                                                                                           SUBJECTIVE STATEMENT: Patient arrives on his Bari-RW.  He states he got out of the hospital yesterday and has a new order for for PT for deconditioning. He states a week ago last Sunday he started having headaches, body aches, light sensitive, bad migraines, sound sensitivity, fatigue, weakness, and a fever. He also felt lightheaded and like the world was spinning when he sat up or stood up or turned too fast. He went  to [facility] ED when he didn't get better and was admitted on 01/24/2023. He states they thought he might have lyme's disease but tested negative, he had other blood tests and lumbar puncture and all of it was negative. He was treated with antibiotics and discharged home after he was fever free without antibiotics or fever reducing medications for 24 hours. Patient states he feels weak and fatigued, with a little bit of light sensitivity still. He states his pain is better controlled since the injection on  01/19/2023. He would like to continue with the rehab he  was doing prior to hospitalization.   PAIN:  NPRS: 3/10 low back and down to both hips.   PRECAUTIONS: Fall   PATIENT GOALS: "To get better, walk, drive again, climb stairs, play with my daughter, help around the house again"  NEXT MD VISIT: 03/13/2023 follow up with Dr. Riley Kill.   OBJECTIVE  Vitals:   01/31/23 1803  BP: 125/77  Pulse: (!) 110  SpO2: 100%   Ambulation distance: 200 feet with heavy B UE use of RW (limited by L LE weakness/fatigue). L toe catching on floor at times.   Sit <> stand from 23 inch plinth with B UE support on RW.    TODAY'S TREATMENT:    Therapeutic exercise: to centralize symptoms and improve ROM, strength, muscular endurance, and activity tolerance required for successful completion of functional activities. - NuStep level 1 using bilateral upper and lower extremities. Seat/handle setting 11/10. For improved extremity mobility, muscular endurance, and activity tolerance; and to induce the analgesic effect of aerobic exercise, stimulate improved joint nutrition, and prepare body structures and systems for following interventions. x 6 minutes. Average SPM = 60.  - sit <> stand from 23 inch plinth, 3x5 - vitals check due to lightheadedness with sit <> stands (see above). - ambulation for max distance: 200 feet with heavy B UE support on Bari-walker, W/C follow for safety.  - ambulation 1x100 feet down curb with  bari-RW, SBA with chair follow.   Pt required multimodal cuing for proper technique and to facilitate improved neuromuscular control, strength, range of motion, and functional ability resulting in improved performance and form.  PATIENT EDUCATION:  Education details: Education on diagnosis, prognosis, POC, anatomy and physiology of current condition. Article handout.  Person educated: patient Education method: Explanation Education comprehension: verbalized understanding and needs further education  HOME EXERCISE PROGRAM: Verbally:  - seated lumbar flexion stretch with head down and one LE extended using rollator, 1x10 each side.  - curl up with legs elevated, 5x15 seconds - sidelying open book 1x10 each side   ASSESSMENT:  CLINICAL IMPRESSION: Re-Evaluation 01/31/2023:  Patient returns to OP PT after being hospitalized for constitutional symptoms and discharged without determining a reason. Patient presents with pain better controlled after LSI on 01/19/2023 and was able to ambulate further than at initial evaluation. He continues to struggle with similar neurogenic strength deficits in B LE and did not appear to have any improvement it sit <> stand. Today he completed 5 reps at a time from an evaluated (23 inch) plinth and reported lightheadedness with this. Blood pressure assessed and found to be [facility] but HR was elevated at 110 BPM. Patient continues to have similar deficits and needs to last OP PT evaluation (see below). Plan to continue with prior plan of care at a rate of 2x a week.   From initial PT Evaluation 12/26/2022:  Patient is a 42 y.o. male referred to outpatient physical therapy with a medical diagnosis of thoracic disc disease with myelopathy, nerve pain, adhesive arachnoiditis who presents with signs and symptoms consistent with chronic low back and thoracic spine pain and neurogenic weakness of B LE. Patient presents with significant pain, neurologic weakness, radiculopathy,  balance, coordination, motor control, balance, sensation, bowel and bladder continence, muscle performance (strength/power/endurance), and activity tolerance impairments that are limiting ability to complete difficulty with or unable to complete any activity that requires weight bearing, use of B LE, and/or balance including working, household and community mobility, walking, driving, going to family gatherings, avoiding incontinent episodes,  playing with daughter, helping around the house, bed mobility, transfers without difficulty. Patient would benefit from a wheelchair to improve mobility and social participation. He would also benefit from an AFO for right foot drop to improve gait. Patient will benefit from skilled physical therapy intervention to address current body structure impairments and activity limitations to improve function and work towards goals set in current POC in order to return to prior level of function or maximal functional improvement.    OBJECTIVE IMPAIRMENTS: Abnormal gait, decreased activity tolerance, decreased balance, decreased coordination, decreased endurance, decreased knowledge of condition, decreased knowledge of use of DME, decreased mobility, difficulty walking, decreased ROM, decreased strength, increased edema, impaired perceived functional ability, increased muscle spasms, impaired flexibility, impaired sensation, impaired tone, improper body mechanics, postural dysfunction, obesity, and pain.   ACTIVITY LIMITATIONS: carrying, lifting, bending, standing, squatting, stairs, transfers, bed mobility, continence, bathing, toileting, dressing, hygiene/grooming, locomotion level, and caring for others  PARTICIPATION LIMITATIONS: meal prep, cleaning, laundry, interpersonal relationship, driving, shopping, community activity, occupation, yard work, and   difficulty with or unable to complete any activity that requires weight bearing, use of B LE, and/or balance including  working, household and community mobility, walking, driving, going to family gatherings, avoiding incontinent episodes, playing with daughter, helping around the house, bed mobility, transfers  PERSONAL FACTORS: Past/current experiences, Time since onset of injury/illness/exacerbation, and 3+ comorbidities:   3 thoracic spine surgeries, thoracic MRI notes "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 Franky Macho on 05/25/2022, adhesive arachnoiditis, nerve pain are also affecting patient's functional outcome.   REHAB POTENTIAL: Fair due to severity and nature of condition.   CLINICAL DECISION MAKING: Evolving/moderate complexity  EVALUATION COMPLEXITY: Moderate   GOALS: Goals reviewed with patient? No  SHORT TERM GOALS: Target date: 01/09/2023  Patient will be independent with initial home exercise program for self-management of symptoms. Baseline: Initial HEP to be provided at visit 2 as appropriate (12/26/22); Goal status: In-progress   LONG TERM GOALS: Target date: 03/20/2023  Patient will be independent with a long-term home exercise program for self-management of symptoms.  Baseline: Initial HEP to be provided at visit 2 as appropriate (12/26/22); participating as able (01/31/2023);  Goal status: In-progress  2.  Patient will demonstrate improved FOTO by equal or greater than 10 points by visit #13 to demonstrate improvement in overall condition and self-reported functional ability.  Baseline: to be tested visit 2 as appropriate (12/26/22); 30 at visit #3 (01/02/2023);  Goal status: In-progress  3.  Patient will demonstrate the ability to ambulate equal or greater than 600  feet with LRAD during the 6 minute walk to improve his household and community mobility.  Baseline: 34 feet with bari-RW (12/26/22); 200 feet with bari-RW (01/31/2023);  Goal status: In-progress  4.  Patient will complete 5 Time Sit To Stand Test from 19.5 inch surface or lower in equal or less than 15 seconds with no UE support to demonstrate improved transfer ability for improved household and toileting mobility.  Baseline: 23 seconds with heavy B UE support on RW from 19.5 inch plinth. Pain throbbing down the right LE.  (12/26/22);  Goal status: In-progress  5.  Patient will report worst pain equal or less than 3/10 with functional activities  to improve his ability to complete basic household and community mobility. Baseline: up to 8/10 (12/26/22); reports 3/10 pain (01/31/2023);  Goal status: In-progress   PLAN:  PT FREQUENCY: 1-2x/week  PT DURATION: 12 weeks  PLANNED INTERVENTIONS: Therapeutic exercises, Therapeutic activity, Neuromuscular re-education, Balance training, Gait training, Patient/Family education, Self Care, Joint mobilization, Stair training, Orthotic/Fit training, DME instructions, Aquatic Therapy, Dry Needling, Electrical stimulation, Wheelchair mobility training, Spinal mobilization, Cryotherapy, Moist heat, Manual therapy, and Re-evaluation.  PLAN FOR NEXT SESSION: update HEP as appropriate, neurodynamics, gait training, LE/core/functional strengthening, stretching, and balance as tolerated, education, manual therapy as needed.   Cira Rue, PT, DPT 01/31/2023, 6:49 PM   [facility] [address] [place], [place] [address] P: [phone] I F: [phone]

## 2023-02-04 ENCOUNTER — Ambulatory Visit: Payer: Medicare Other | Admitting: Physical Therapy

## 2023-02-04 ENCOUNTER — Encounter: Payer: Self-pay | Admitting: Physical Therapy

## 2023-02-04 DIAGNOSIS — R29818 Other symptoms and signs involving the nervous system: Secondary | ICD-10-CM

## 2023-02-04 DIAGNOSIS — M6281 Muscle weakness (generalized): Secondary | ICD-10-CM

## 2023-02-04 DIAGNOSIS — M5416 Radiculopathy, lumbar region: Secondary | ICD-10-CM

## 2023-02-04 DIAGNOSIS — R296 Repeated falls: Secondary | ICD-10-CM

## 2023-02-04 DIAGNOSIS — R262 Difficulty in walking, not elsewhere classified: Secondary | ICD-10-CM

## 2023-02-04 DIAGNOSIS — M5459 Other low back pain: Secondary | ICD-10-CM

## 2023-02-04 NOTE — Therapy (Signed)
OUTPATIENT PHYSICAL THERAPY TREATMENT NOTE   Patient Name: Travis Fitzpatrick MRN: [id number] DOB:03/30/1981, 42 y.o., male Today's Date: 02/04/23   END OF SESSION:  PT End of Session - 02/04/23 1657     Visit Number 7    Number of Visits 13    Date for PT Re-Evaluation 03/20/23    Authorization Type MEDICARE PART B reporting period from 12/26/2022    Progress Note Due on Visit 10    PT Start Time 1647    PT Stop Time 1730    PT Time Calculation (min) 43 min    Activity Tolerance Patient limited by fatigue;Patient limited by pain;Patient tolerated treatment well    Behavior During Therapy [facility] for tasks assessed/performed              Past Medical History:  Diagnosis Date   Bowel trouble    urgency   Medical history non-contributory    Urinary urgency    Past Surgical History:  Procedure Laterality Date   BACK SURGERY  2010   CIRCUMCISION     LUMBAR LAMINECTOMY/DECOMPRESSION MICRODISCECTOMY  07/20/2011   Procedure: LUMBAR LAMINECTOMY/DECOMPRESSION MICRODISCECTOMY;  Surgeon: Carmela Hurt;  Location: [facility];  Service: Neurosurgery;  Laterality: N/A;  right thoracotomy with thoracic eight-nine discectomy and fusion   LUMBAR LAMINECTOMY/DECOMPRESSION MICRODISCECTOMY Right 10/06/2021   Procedure: Right Lumbar Four-Five Microdiscectomy, Right Lumbar Five-Sacal One Foraminotomy;  Surgeon: Coletta Memos, MD;  Location: [facility] OR;  Service: Neurosurgery;  Laterality: Right;  3C/RM 21   THORACIC DISCECTOMY  07/16/2012   Procedure: THORACIC DISCECTOMY;  Surgeon: Carmela Hurt, MD;  Location: [facility];  Service: Neurosurgery;  Laterality: Right;  RIGHT Thoracic seven-eight  thoracic diskectomy via thoracotomy by dr Laneta Simmers   THORACIC DISCECTOMY N/A 12/15/2014   Procedure: THORACIC SEVEN TO THORACIC NINE Laminectomy ;  Surgeon: Coletta Memos, MD;  Location: [facility];  Service: Neurosurgery;  Laterality: N/A;   THORACIC DISCECTOMY N/A 06/06/2017   Procedure: LAMINECTOMY THORACIC  NINE-TEN;  Surgeon: Coletta Memos, MD;  Location: [facility] OR;  Service: Neurosurgery;  Laterality: N/A;  LAMINECTOMY THORACIC NINE-TEN   THORACOTOMY  07/20/2011   Procedure: THORACOTOMY OPEN FOR SPINE SURGERY;  Surgeon: Norton Blizzard, MD;  Location: [facility];  Service: Vascular;  Laterality: N/A;   THORACOTOMY  07/16/2012   Procedure: THORACOTOMY OPEN FOR SPINE SURGERY;  Surgeon: Alleen Borne, MD;  Location: [facility];  Service: Thoracic;  Laterality: N/A;   Patient Active Problem List   Diagnosis Date Noted   Nerve pain 12/05/2022   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   S/P lumbar spinal fusion 05/25/2022   Synovial cyst of lumbar facet joint 05/25/2022   HNP (herniated nucleus pulposus), lumbar 10/06/2021   Hand pain 06/28/2021   Lumbar facet arthropathy 01/11/2021   Thoracic spondylosis with myelopathy 01/11/2021   Thoracic spinal stenosis 06/06/2017   Stenosis, spinal, thoracic 12/15/2014   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 09/22/2014   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/2011    PCP: Nira Retort  REFERRING PROVIDER: Ranelle Oyster, MD   REFERRING DIAG: thoracic disc disease with myelopathy, nerve pain, adhesive arachnoiditis  Rationale for Evaluation and Treatment: Rehabilitation  THERAPY DIAG:  Other low back pain  Other symptoms and signs involving the nervous system  Radiculopathy, lumbar region  Muscle weakness (generalized)  Repeated falls  Difficulty in walking, not elsewhere classified  ONSET DATE:  Suddenly started having weakness 11 years ago, most recent episode of worsening  October 2022, s/p right L4-5 laminotomy, microdiscectomy on 10/06/2021, now s/p L4-5 PLIF by Dr. Franky Macho on 05/25/2022.  PERTINENT HISTORY:  Patient is a 42 y.o. male who presents to outpatient physical therapy with a referral for medical diagnosis thoracic disc disease with myelopathy, nerve pain, adhesive arachnoiditis. This  patient's chief complaints consist of disabling low back and R >L leg pain and weakness with activity, bowel and bladder urgency, incontinence, and retention, leading to the following functional deficits: difficulty with or unable to complete any activity that requires weight bearing, use of B LE, and/or balance including working, household and community mobility, walking, driving, going to family gatherings, avoiding incontinent episodes, playing with daughter, helping around the house, bed mobility, transfers. 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 Franky Macho on 05/25/2022, adhesive arachnoiditis, nerve pain.  Patient denies hx of cancer, stroke, seizures, lung problems, heart problems, diabetes, unexplained weight loss, and osteoporosis.  SUBJECTIVE:                                                                                                                                                                                           SUBJECTIVE STATEMENT: Patient arrives on his Bari-RW. He states his pain is doing pretty good, but he felt bad on and off over the days after last PT session. He states he felt light headed at times but is continuing to get better. He states his air conditioner went out today and it should be getting fixed while he is at PT today.    PAIN:  NPRS: 3/10 low back and right hip   PRECAUTIONS: Fall   PATIENT GOALS: "To get better, walk, drive again, climb stairs, play with my daughter, help around the house again"  NEXT MD VISIT: 03/13/2023 follow up with  Dr. Riley Kill.   OBJECTIVE  TODAY'S TREATMENT:    Therapeutic exercise: to centralize symptoms and improve ROM, strength, muscular endurance, and activity tolerance required for successful completion of functional activities. - NuStep level 4 using bilateral upper and lower extremities. Seat/handle setting 11/10. For improved extremity mobility, muscular endurance, and activity tolerance; and to induce the analgesic effect of aerobic exercise, stimulate improved joint nutrition, and prepare body structures and systems for following interventions. x 8:25 minutes. Average SPM = 61.  - sit <>  stand from 19.5 inch plinth, 3x5 B UE support on RW.  - ambulation for max distance: 2x200 feet with B UE support on Bari-walker, marching, W/C follow for safety.  - ambulation 1x100 feet down curb with bari-RW, SBA with chair follow.   Pt required multimodal cuing for proper technique and to facilitate improved neuromuscular control, strength, range of motion, and functional ability resulting in improved performance and form.  PATIENT EDUCATION:  Education details: Education on diagnosis, prognosis, POC, anatomy and physiology of current condition. Article handout.  Person educated: patient Education method: Explanation Education comprehension: verbalized understanding and needs further education  HOME EXERCISE PROGRAM: Verbally:  - seated lumbar flexion stretch with head down and one LE extended using rollator, 1x10 each side.  - curl up with legs elevated, 5x15 seconds - sidelying open book 1x10 each side   ASSESSMENT:  CLINICAL IMPRESSION: Patient arrived feeling better than at last PT session and was able to tolerate more ambulation and sit <> stand from lower surface. His B LE fatigued by end of session and he had increased burning pain up to 4/10 burning in the right hip region by end of session. Plan to adjust intensity and activities of session based on assessment of longer term tolerance to  today's session. Patient continues to have pain in his back and BLE and neurogenic weakness that limits his mobility and activity tolerance. Patient would benefit from continued management of limiting condition by skilled physical therapist to address remaining impairments and functional limitations to work towards stated goals and return to PLOF or maximal functional independence.    From initial PT Evaluation 12/26/2022:  Patient is a 42 y.o. male referred to outpatient physical therapy with a medical diagnosis of thoracic disc disease with myelopathy, nerve pain, adhesive arachnoiditis who presents with signs and symptoms consistent with chronic low back and thoracic spine pain and neurogenic weakness of B LE. Patient presents with significant pain, neurologic weakness, radiculopathy, balance, coordination, motor control, balance, sensation, bowel and bladder continence, muscle performance (strength/power/endurance), and activity tolerance impairments that are limiting ability to complete difficulty with or unable to complete any activity that requires weight bearing, use of B LE, and/or balance including working, household and community mobility, walking, driving, going to family gatherings, avoiding incontinent episodes, playing with daughter, helping around the house, bed mobility, transfers without difficulty. Patient would benefit from a wheelchair to improve mobility and social participation. He would also benefit from an AFO for right foot drop to improve gait. Patient will benefit from skilled physical therapy intervention to address current body structure impairments and activity limitations to improve function and work towards goals set in current POC in order to return to prior level of function or maximal functional improvement.    OBJECTIVE IMPAIRMENTS: Abnormal gait, decreased activity tolerance, decreased balance, decreased coordination, decreased endurance, decreased knowledge of condition,  decreased knowledge of use of DME, decreased mobility, difficulty walking, decreased ROM, decreased strength, increased edema, impaired perceived functional ability, increased muscle spasms, impaired flexibility, impaired sensation, impaired tone, improper body mechanics, postural dysfunction, obesity, and pain.   ACTIVITY LIMITATIONS: carrying, lifting, bending, standing, squatting, stairs, transfers, bed mobility, continence, bathing, toileting, dressing, hygiene/grooming, locomotion level, and caring for others  PARTICIPATION LIMITATIONS: meal prep, cleaning, laundry, interpersonal relationship, driving, shopping, community activity, occupation, yard work, and   difficulty with or unable to complete any activity that requires weight bearing, use of B LE, and/or balance including working, household and community mobility, walking, driving, going to family  gatherings, avoiding incontinent episodes, playing with daughter, helping around the house, bed mobility, transfers  PERSONAL FACTORS: Past/current experiences, Time since onset of injury/illness/exacerbation, and 3+ comorbidities:   3 thoracic spine surgeries, thoracic MRI notes "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 Franky Macho on 05/25/2022, adhesive arachnoiditis, nerve pain are also affecting patient's functional outcome.   REHAB POTENTIAL: Fair due to severity and nature of condition.   CLINICAL DECISION MAKING: Evolving/moderate complexity  EVALUATION COMPLEXITY: Moderate   GOALS: Goals reviewed with patient? No  SHORT TERM GOALS: Target date: 01/09/2023  Patient will be independent with initial home exercise program for  self-management of symptoms. Baseline: Initial HEP to be provided at visit 2 as appropriate (12/26/22); Goal status: In-progress   LONG TERM GOALS: Target date: 03/20/2023  Patient will be independent with a long-term home exercise program for self-management of symptoms.  Baseline: Initial HEP to be provided at visit 2 as appropriate (12/26/22); participating as able (01/31/2023);  Goal status: In-progress  2.  Patient will demonstrate improved FOTO by equal or greater than 10 points by visit #13 to demonstrate improvement in overall condition and self-reported functional ability.  Baseline: to be tested visit 2 as appropriate (12/26/22); 30 at visit #3 (01/02/2023);  Goal status: In-progress  3.  Patient will demonstrate the ability to ambulate equal or greater than 600 feet with LRAD during the 6 minute walk to improve his household and community mobility.  Baseline: 34 feet with bari-RW (12/26/22); 200 feet with bari-RW (01/31/2023);  Goal status: In-progress  4.  Patient will complete 5 Time Sit To Stand Test from 19.5 inch surface or lower in equal or less than 15 seconds with no UE support to demonstrate improved transfer ability for improved household and toileting mobility.  Baseline: 23 seconds with heavy B UE support on RW from 19.5 inch plinth. Pain throbbing down the right LE.  (12/26/22);  Goal status: In-progress  5.  Patient will report worst pain equal or less than 3/10 with functional activities to improve his ability to complete basic household and community mobility. Baseline: up to 8/10 (12/26/22); reports 3/10 pain (01/31/2023);  Goal status: In-progress   PLAN:  PT FREQUENCY: 1-2x/week  PT DURATION: 12 weeks  PLANNED INTERVENTIONS: Therapeutic exercises, Therapeutic activity, Neuromuscular re-education, Balance training, Gait training, Patient/Family education, Self Care, Joint mobilization, Stair training, Orthotic/Fit training, DME instructions, Aquatic Therapy,  Dry Needling, Electrical stimulation, Wheelchair mobility training, Spinal mobilization, Cryotherapy, Moist heat, Manual therapy, and Re-evaluation.  PLAN FOR NEXT SESSION: update HEP as appropriate, neurodynamics, gait training, LE/core/functional strengthening, stretching, and balance as tolerated, education, manual therapy as needed.   Cira Rue, PT, DPT 02/04/2023, 6:33 PM   [facility] [address] [place], [place] [address] P: [phone] I F: [phone]

## 2023-02-07 ENCOUNTER — Ambulatory Visit: Payer: Medicare Other | Admitting: Physical Therapy

## 2023-02-11 ENCOUNTER — Ambulatory Visit: Payer: Medicare Other | Attending: Physical Medicine & Rehabilitation

## 2023-02-11 DIAGNOSIS — M5416 Radiculopathy, lumbar region: Secondary | ICD-10-CM

## 2023-02-11 DIAGNOSIS — R296 Repeated falls: Secondary | ICD-10-CM | POA: Diagnosis present

## 2023-02-11 DIAGNOSIS — M5459 Other low back pain: Secondary | ICD-10-CM | POA: Diagnosis present

## 2023-02-11 DIAGNOSIS — R262 Difficulty in walking, not elsewhere classified: Secondary | ICD-10-CM | POA: Diagnosis present

## 2023-02-11 DIAGNOSIS — M6281 Muscle weakness (generalized): Secondary | ICD-10-CM

## 2023-02-11 DIAGNOSIS — R29818 Other symptoms and signs involving the nervous system: Secondary | ICD-10-CM | POA: Diagnosis present

## 2023-02-11 NOTE — Therapy (Signed)
OUTPATIENT PHYSICAL THERAPY TREATMENT  Patient Name: Travis Fitzpatrick MRN: [id number] DOB:1981-04-27, 42 y.o., male Today's Date: 02/11/23   END OF SESSION:  PT End of Session - 02/11/23 1823     Visit Number 8    Number of Visits 13    Date for PT Re-Evaluation 03/20/23    Authorization Type MEDICARE PART B reporting period from 12/26/2022    Progress Note Due on Visit 10    PT Start Time 1819    PT Stop Time 1859    PT Time Calculation (min) 40 min    Activity Tolerance Patient limited by fatigue;Patient limited by pain;Patient tolerated treatment well    Behavior During Therapy [facility] for tasks assessed/performed              Past Medical History:  Diagnosis Date   Bowel trouble    urgency   Medical history non-contributory    Urinary urgency    Past Surgical History:  Procedure Laterality Date   BACK SURGERY  2010   CIRCUMCISION     LUMBAR LAMINECTOMY/DECOMPRESSION MICRODISCECTOMY  07/20/2011   Procedure: LUMBAR LAMINECTOMY/DECOMPRESSION MICRODISCECTOMY;  Surgeon: Carmela Hurt;  Location: [facility];  Service: Neurosurgery;  Laterality: N/A;  right thoracotomy with thoracic eight-nine discectomy and fusion   LUMBAR LAMINECTOMY/DECOMPRESSION MICRODISCECTOMY Right 10/06/2021   Procedure: Right Lumbar Four-Five Microdiscectomy, Right Lumbar Five-Sacal One Foraminotomy;  Surgeon: Coletta Memos, MD;  Location: [facility];  Service: Neurosurgery;  Laterality: Right;  3C/RM 21   THORACIC DISCECTOMY  07/16/2012   Procedure: THORACIC DISCECTOMY;  Surgeon: Carmela Hurt, MD;  Location: [facility];  Service: Neurosurgery;  Laterality: Right;  RIGHT Thoracic seven-eight  thoracic diskectomy via thoracotomy by dr Laneta Simmers   THORACIC DISCECTOMY N/A 12/15/2014   Procedure: THORACIC SEVEN TO THORACIC NINE Laminectomy ;  Surgeon: Coletta Memos, MD;  Location: [facility];  Service: Neurosurgery;  Laterality: N/A;   THORACIC DISCECTOMY N/A 06/06/2017   Procedure: LAMINECTOMY THORACIC  NINE-TEN;  Surgeon: Coletta Memos, MD;  Location: [facility];  Service: Neurosurgery;  Laterality: N/A;  LAMINECTOMY THORACIC NINE-TEN   THORACOTOMY  07/20/2011   Procedure: THORACOTOMY OPEN FOR SPINE SURGERY;  Surgeon: Norton Blizzard, MD;  Location: [facility];  Service: Vascular;  Laterality: N/A;   THORACOTOMY  07/16/2012   Procedure: THORACOTOMY OPEN FOR SPINE SURGERY;  Surgeon: Alleen Borne, MD;  Location: [facility];  Service: Thoracic;  Laterality: N/A;   Patient Active Problem List   Diagnosis Date Noted   Nerve pain 12/05/2022   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   S/P lumbar spinal fusion 05/25/2022   Synovial cyst of lumbar facet joint 05/25/2022   HNP (herniated nucleus pulposus), lumbar 10/06/2021   Hand pain 06/28/2021   Lumbar facet arthropathy 01/11/2021   Thoracic spondylosis with myelopathy 01/11/2021   Thoracic spinal stenosis 06/06/2017   Stenosis, spinal, thoracic 12/15/2014   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 09/22/2014   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/2011    PCP: Nira Retort  REFERRING PROVIDER: Ranelle Oyster, MD   REFERRING DIAG: thoracic disc disease with myelopathy, nerve pain, adhesive arachnoiditis  Rationale for Evaluation and Treatment: Rehabilitation  THERAPY DIAG:  Other low back pain  Other symptoms and signs involving the nervous system  Radiculopathy, lumbar region  Muscle weakness (generalized)  Repeated falls  Difficulty in walking, not elsewhere classified  ONSET DATE:  Suddenly started having weakness 11 years ago, most recent episode of worsening October 2022,  s/p right L4-5 laminotomy, microdiscectomy on 10/06/2021, now s/p L4-5 PLIF by Dr. Franky Macho on 05/25/2022.  PERTINENT HISTORY:  Patient is a 42 y.o. male who presents to outpatient physical therapy with a referral for medical diagnosis thoracic disc disease with myelopathy, nerve pain, adhesive arachnoiditis. This  patient's chief complaints consist of disabling low back and R >L leg pain and weakness with activity, bowel and bladder urgency, incontinence, and retention, leading to the following functional deficits: difficulty with or unable to complete any activity that requires weight bearing, use of B LE, and/or balance including working, household and community mobility, walking, driving, going to family gatherings, avoiding incontinent episodes, playing with daughter, helping around the house, bed mobility, transfers. 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 Franky Macho on 05/25/2022, adhesive arachnoiditis, nerve pain.  Patient denies hx of cancer, stroke, seizures, lung problems, heart problems, diabetes, unexplained weight loss, and osteoporosis.  SUBJECTIVE:                                                                                                                                                                                           SUBJECTIVE STATEMENT: Pt reports missed last week due to migraine and Rt leg weakness. He is feeling a little better overall.   PAIN:  NPRS: 4-5/10; Rt inguinal crease, lateral hip posterior hip;    PRECAUTIONS: Fall   PATIENT GOALS: "To get better, walk, drive again, climb stairs, play with my daughter, help around the house again"  NEXT MD VISIT: 03/13/2023 follow up with Dr. Riley Kill.   OBJECTIVE  TODAY'S TREATMENT:     - NuStep level 4 using bilateral upper and lower extremities. Seat/handle setting 11/10; 8:25 minutes. Average SPM = 63  - STS  from 24" height 2x8, heavy UE support on BRW  - seated marching 1x10 LLE, 1x10 RLE with modA RLE  - seated marching 1x10 LLE, 1x10 RLE with modA RLE - ambulation 51ft  - seated dead lift x8 with min Guard assist  - ambulation 73ft  - ambulation 64ft  - seated dead lift x8 with min Guard assist  *education about potential benefit with rigid bilat AFO for knee buckle assist   Pt required multimodal cuing for proper technique and to facilitate improved neuromuscular control, strength, range of motion,  and functional ability resulting in improved performance and form.  PATIENT EDUCATION:  Education details: Education on diagnosis, prognosis, POC, anatomy and physiology of current condition. Article handout.  Person educated: patient Education method: Explanation Education comprehension: verbalized understanding and needs further education  HOME EXERCISE PROGRAM: Verbally:  - seated lumbar flexion stretch with head down and one LE extended using rollator, 1x10 each side.  - curl up with legs elevated, 5x15 seconds - sidelying open book 1x10 each side   ASSESSMENT:  CLINICAL IMPRESSION: Continued with POC as previously established. Pt still off a little from recent migraine and RLE throbbing/weakness. Patient would benefit from continued management of limiting condition by skilled physical therapist to address remaining impairments and functional limitations to work towards stated goals and return to PLOF or maximal functional independence.    OBJECTIVE IMPAIRMENTS: Abnormal gait, decreased activity tolerance, decreased balance, decreased coordination, decreased endurance, decreased knowledge of condition, decreased knowledge of use of DME, decreased mobility, difficulty walking, decreased ROM, decreased strength, increased edema, impaired perceived functional ability, increased muscle spasms, impaired flexibility, impaired sensation, impaired tone, improper body mechanics, postural  dysfunction, obesity, and pain.   ACTIVITY LIMITATIONS: carrying, lifting, bending, standing, squatting, stairs, transfers, bed mobility, continence, bathing, toileting, dressing, hygiene/grooming, locomotion level, and caring for others  PARTICIPATION LIMITATIONS: meal prep, cleaning, laundry, interpersonal relationship, driving, shopping, community activity, occupation, yard work, and   difficulty with or unable to complete any activity that requires weight bearing, use of B LE, and/or balance including working, household and community mobility, walking, driving, going to family gatherings, avoiding incontinent episodes, playing with daughter, helping around the house, bed mobility, transfers  PERSONAL FACTORS: Past/current experiences, Time since onset of injury/illness/exacerbation, and 3+ comorbidities:   3 thoracic spine surgeries, thoracic MRI notes "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 Franky Macho on 05/25/2022, adhesive arachnoiditis, nerve pain are also affecting patient's functional outcome.   REHAB POTENTIAL: Fair due to severity and nature of condition.   CLINICAL DECISION MAKING: Evolving/moderate complexity  EVALUATION COMPLEXITY: Moderate   GOALS: Goals reviewed with patient? No  SHORT TERM GOALS: Target date: 01/09/2023  Patient will be independent with initial home exercise program for self-management of symptoms. Baseline: Initial HEP to be provided at visit 2 as appropriate (12/26/22); Goal status: In-progress   LONG TERM GOALS: Target date: 03/20/2023  Patient will be independent with a long-term home exercise program for self-management of symptoms.   Baseline: Initial HEP to be provided at visit 2 as appropriate (12/26/22); participating as able (01/31/2023);  Goal status: In-progress  2.  Patient will demonstrate improved FOTO by equal or greater than 10 points by visit #13 to demonstrate improvement in overall condition and self-reported functional ability.  Baseline: to be tested visit 2 as appropriate (12/26/22); 30 at visit #3 (01/02/2023);  Goal status: In-progress  3.  Patient will demonstrate the ability to ambulate equal or greater than 600 feet with LRAD during the 6 minute walk to improve his household and community mobility.  Baseline: 34 feet with bari-RW (12/26/22); 200 feet with bari-RW (01/31/2023);  Goal status: In-progress  4.  Patient will complete 5 Time Sit To Stand Test from 19.5 inch surface or lower in equal or less than 15  seconds with no UE support to demonstrate improved transfer ability for improved household and toileting mobility.  Baseline: 23 seconds with heavy B UE support on RW from 19.5 inch plinth. Pain throbbing down the right LE.  (12/26/22);  Goal status: In-progress  5.  Patient will report worst pain equal or less than 3/10 with functional activities to improve his ability to complete basic household and community mobility. Baseline: up to 8/10 (12/26/22); reports 3/10 pain (01/31/2023);  Goal status: In-progress   PLAN:  PT FREQUENCY: 1-2x/week  PT DURATION: 12 weeks  PLANNED INTERVENTIONS: Therapeutic exercises, Therapeutic activity, Neuromuscular re-education, Balance training, Gait training, Patient/Family education, Self Care, Joint mobilization, Stair training, Orthotic/Fit training, DME instructions, Aquatic Therapy, Dry Needling, Electrical stimulation, Wheelchair mobility training, Spinal mobilization, Cryotherapy, Moist heat, Manual therapy, and Re-evaluation.  PLAN FOR NEXT SESSION: update HEP as appropriate, neurodynamics, gait training, LE/core/functional strengthening, stretching,  and balance as tolerated, education, manual therapy as needed.   Rosamaria Lints, PT, DPT 02/11/2023, 6:25 PM   6:25 PM, 02/11/23 Rosamaria Lints, PT, DPT Physical Therapist - [place] [phone] (Office)  [facility] [address] [place], [place] [address] P: [phone] I F: [phone]

## 2023-02-13 ENCOUNTER — Ambulatory Visit: Payer: Medicare Other

## 2023-02-13 DIAGNOSIS — R29818 Other symptoms and signs involving the nervous system: Secondary | ICD-10-CM

## 2023-02-13 DIAGNOSIS — M5459 Other low back pain: Secondary | ICD-10-CM

## 2023-02-13 DIAGNOSIS — M5416 Radiculopathy, lumbar region: Secondary | ICD-10-CM

## 2023-02-13 DIAGNOSIS — M6281 Muscle weakness (generalized): Secondary | ICD-10-CM

## 2023-02-13 NOTE — Therapy (Signed)
OUTPATIENT PHYSICAL THERAPY TREATMENT  Patient Name: Travis Fitzpatrick MRN: [id number] DOB:[birth year], 42 y.o., male Today's Date: 02/13/23   END OF SESSION:  PT End of Session - 02/13/23 1827     Visit Number 9    Number of Visits 13    Date for PT Re-Evaluation 03/20/23    Authorization Type MEDICARE PART B reporting period from 12/26/2022    Progress Note Due on Visit 10    PT Start Time 1819    PT Stop Time 1900    PT Time Calculation (min) 41 min    Activity Tolerance Patient limited by fatigue;Patient limited by pain;Patient tolerated treatment well    Behavior During Therapy [facility] for tasks assessed/performed              Past Medical History:  Diagnosis Date   Bowel trouble    urgency   Medical history non-contributory    Urinary urgency    Past Surgical History:  Procedure Laterality Date   BACK SURGERY  2010   CIRCUMCISION     LUMBAR LAMINECTOMY/DECOMPRESSION MICRODISCECTOMY  07/20/2011   Procedure: LUMBAR LAMINECTOMY/DECOMPRESSION MICRODISCECTOMY;  Surgeon: Travis Fitzpatrick;  Location: [facility];  Service: Neurosurgery;  Laterality: N/A;  right thoracotomy with thoracic eight-nine discectomy and fusion   LUMBAR LAMINECTOMY/DECOMPRESSION MICRODISCECTOMY Right 10/06/2021   Procedure: Right Lumbar Four-Five Microdiscectomy, Right Lumbar Five-Sacal One Foraminotomy;  Surgeon: Travis Memos, MD;  Location: [facility];  Service: Neurosurgery;  Laterality: Right;  3C/RM 21   THORACIC DISCECTOMY  07/16/2012   Procedure: THORACIC DISCECTOMY;  Surgeon: Travis Hurt, MD;  Location: [facility];  Service: Neurosurgery;  Laterality: Right;  RIGHT Thoracic seven-eight  thoracic diskectomy via thoracotomy by dr Travis Fitzpatrick   THORACIC DISCECTOMY N/A 12/15/2014   Procedure: THORACIC SEVEN TO THORACIC NINE Laminectomy ;  Surgeon: Travis Memos, MD;  Location: [facility];  Service: Neurosurgery;  Laterality: N/A;   THORACIC DISCECTOMY N/A 06/06/2017   Procedure: LAMINECTOMY THORACIC  NINE-TEN;  Surgeon: Travis Memos, MD;  Location: [facility];  Service: Neurosurgery;  Laterality: N/A;  LAMINECTOMY THORACIC NINE-TEN   THORACOTOMY  07/20/2011   Procedure: THORACOTOMY OPEN FOR SPINE SURGERY;  Surgeon: Travis Blizzard, MD;  Location: [facility];  Service: Vascular;  Laterality: N/A;   THORACOTOMY  07/16/2012   Procedure: THORACOTOMY OPEN FOR SPINE SURGERY;  Surgeon: Travis Borne, MD;  Location: [facility];  Service: Thoracic;  Laterality: N/A;   Patient Active Problem List   Diagnosis Date Noted   Nerve pain 12/05/2022   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   S/P lumbar spinal fusion 05/25/2022   Synovial cyst of lumbar facet joint 05/25/2022   HNP (herniated nucleus pulposus), lumbar 10/06/2021   Hand pain 06/28/2021   Lumbar facet arthropathy 01/11/2021   Thoracic spondylosis with myelopathy 01/11/2021   Thoracic spinal stenosis 06/06/2017   Stenosis, spinal, thoracic 12/15/2014   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 09/22/2014   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/2011    PCP: Travis Fitzpatrick  REFERRING PROVIDER: Ranelle Oyster, MD   REFERRING DIAG: thoracic disc disease with myelopathy, nerve pain, adhesive arachnoiditis  Rationale for Evaluation and Treatment: Rehabilitation  THERAPY DIAG:  Other low back pain  Other symptoms and signs involving the nervous system  Radiculopathy, lumbar region  Muscle weakness (generalized)  ONSET DATE:  Suddenly started having weakness 11 years ago, most recent episode of worsening October 2022, s/p right L4-5 laminotomy, microdiscectomy on 10/06/2021, now s/p L4-5  PLIF by Dr. Franky Fitzpatrick on 05/25/2022.  PERTINENT HISTORY:  Patient is a 42 y.o. male who presents to outpatient physical therapy with a referral for medical diagnosis thoracic disc disease with myelopathy, nerve pain, adhesive arachnoiditis. This patient's chief complaints consist of disabling low back and R >L leg pain  and weakness with activity, bowel and bladder urgency, incontinence, and retention, leading to the following functional deficits: difficulty with or unable to complete any activity that requires weight bearing, use of B LE, and/or balance including working, household and community mobility, walking, driving, going to family gatherings, avoiding incontinent episodes, playing with daughter, helping around the house, bed mobility, transfers. 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 Franky Fitzpatrick on 05/25/2022, adhesive arachnoiditis, nerve pain.  Patient denies hx of cancer, stroke, seizures, lung problems, heart problems, diabetes, unexplained weight loss, and osteoporosis.  SUBJECTIVE:                                                                                                                                                                                           SUBJECTIVE STATEMENT: Rt groin pain persisted after last session for remainder of day and following day. He reports adjustment to height of BRW is working out great, he feels less distortion of scapulae in stance and AMB, also improved ease during transfers.    PAIN:  NPRS: 4/10; Rt inguinal crease, lateral hip posterior hip;    PRECAUTIONS: Fall   PATIENT GOALS: "To get better, walk, drive again, climb stairs, play with my daughter, help around the house again"  NEXT MD VISIT: 03/13/2023 follow up with Dr. Riley Fitzpatrick.   OBJECTIVE  TODAY'S TREATMENT:     - NuStep level 3 using bilateral upper and lower extremities.  Seat/handle setting 11/10; 5 minutes. Average SPM = 63  Less intensity and time to avoid exacerbation of Right groind pain as previously seen- successful!  SAQ 2x10 bilat, supervision level Hooklying marching 2x10 bilat modA BLE (less groin hip pain with slight ABDCT away from midline during flexion) LAD to RLE (relieves hip pain) 3x45sec AMB overground BRW 3x48ft, throughout session AMB overground BRW with bilat rigid carbonfiber AFO (anterior tibial bearing) 40ft with BRW to AFO trial: improve ergonics of gait, decreased effort, improved toe clearance.   Pt required multimodal cuing  for proper technique and to facilitate improved neuromuscular control, strength, range of motion, and functional ability resulting in improved performance and form.  PATIENT EDUCATION:  Education details: Education on diagnosis, prognosis, POC, anatomy and physiology of current condition. Article handout.  Person educated: patient Education method: Explanation Education comprehension: verbalized understanding and needs further education  HOME EXERCISE PROGRAM: Verbally:  - seated lumbar flexion stretch with head down and one LE extended using rollator, 1x10 each side.  - curl up with legs elevated, 5x15 seconds - sidelying open book 1x10 each side   ASSESSMENT:  CLINICAL IMPRESSION: Pt reports pain exacerbation in Rt groin lasted about a day, but now improved about 4/10 on arrival, session limited on nustep reduce subsequent aggravation of area. Pt responds well to long axis distraction of Right hip today with significant improvement ingroin pain when performed and moderate improvement thereafter. Also noted more groin pain with active and passive hip flexion >60 degrees which is somewhat better when knee is moved away from midline- all of which are consistent with FAI presentation. Will consider ways to avoid FAI aggravation in future interventions as able. Following prior session's discussion about how  rigid AFO could provide improved knee stability and gait ergonomics and improve capacity for future endeavors of longer distance AMB, we are able to try 2 AFO this date to give pt an idea of what benefit there may be. Noted improvements in gait quality and ease both objective and subjective. Will coordinate obtaining order for bilat AFO with primary therapist in future.  Patient would benefit from continued management of limiting condition by skilled physical therapist to address remaining impairments and functional limitations to work towards stated goals and return to PLOF or maximal functional independence.    OBJECTIVE IMPAIRMENTS: Abnormal gait, decreased activity tolerance, decreased balance, decreased coordination, decreased endurance, decreased knowledge of condition, decreased knowledge of use of DME, decreased mobility, difficulty walking, decreased ROM, decreased strength, increased edema, impaired perceived functional ability, increased muscle spasms, impaired flexibility, impaired sensation, impaired tone, improper body mechanics, postural dysfunction, obesity, and pain.   ACTIVITY LIMITATIONS: carrying, lifting, bending, standing, squatting, stairs, transfers, bed mobility, continence, bathing, toileting, dressing, hygiene/grooming, locomotion level, and caring for others  PARTICIPATION LIMITATIONS: meal prep, cleaning, laundry, interpersonal relationship, driving, shopping, community activity, occupation, yard work, and   difficulty with or unable to complete any activity that requires weight bearing, use of B LE, and/or balance including working, household and community mobility, walking, driving, going to family gatherings, avoiding incontinent episodes, playing with daughter, helping around the house, bed mobility, transfers  PERSONAL FACTORS: Past/current experiences, Time since onset of injury/illness/exacerbation, and 3+ comorbidities:   3 thoracic spine surgeries, thoracic MRI notes  "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 Franky Fitzpatrick on 05/25/2022, adhesive arachnoiditis, nerve pain are also affecting patient's functional outcome.   REHAB POTENTIAL: Fair due to severity and nature of condition.   CLINICAL DECISION MAKING: Evolving/moderate complexity  EVALUATION COMPLEXITY: Moderate   GOALS: Goals reviewed with patient? No  SHORT TERM GOALS: Target date: 01/09/2023  Patient will be independent with initial home exercise program for self-management of symptoms. Baseline: Initial HEP to be provided at visit 2 as appropriate (12/26/22); Goal status: In-progress  LONG TERM GOALS: Target date: 03/20/2023  Patient will be independent with a long-term home exercise program for self-management of symptoms.  Baseline: Initial HEP to be provided at visit 2 as appropriate (12/26/22); participating as able (01/31/2023);  Goal status: In-progress  2.  Patient will demonstrate improved FOTO by equal or greater than 10 points by visit #13 to demonstrate improvement in overall condition and self-reported functional ability.  Baseline: to be tested visit 2 as appropriate (12/26/22); 30 at visit #3 (01/02/2023);  Goal status: In-progress  3.  Patient will demonstrate the ability to ambulate equal or greater than 600 feet with LRAD during the 6 minute walk to improve his household and community mobility.  Baseline: 34 feet with bari-RW (12/26/22); 200 feet with bari-RW (01/31/2023);  Goal status: In-progress  4.  Patient will complete 5 Time Sit To Stand Test from 19.5 inch surface or lower in equal or less than 15 seconds with no UE support to demonstrate improved  transfer ability for improved household and toileting mobility.  Baseline: 23 seconds with heavy B UE support on RW from 19.5 inch plinth. Pain throbbing down the right LE.  (12/26/22);  Goal status: In-progress  5.  Patient will report worst pain equal or less than 3/10 with functional activities to improve his ability to complete basic household and community mobility. Baseline: up to 8/10 (12/26/22); reports 3/10 pain (01/31/2023);  Goal status: In-progress   PLAN:  PT FREQUENCY: 1-2x/week  PT DURATION: 12 weeks  PLANNED INTERVENTIONS: Therapeutic exercises, Therapeutic activity, Neuromuscular re-education, Balance training, Gait training, Patient/Family education, Self Care, Joint mobilization, Stair training, Orthotic/Fit training, DME instructions, Aquatic Therapy, Dry Needling, Electrical stimulation, Wheelchair mobility training, Spinal mobilization, Cryotherapy, Moist heat, Manual therapy, and Re-evaluation.  PLAN FOR NEXT SESSION: send referral to provider for bilat rigid AFO script, update HEP as appropriate, neurodynamics, gait training, LE/core/functional strengthening, stretching, and balance as tolerated, education, manual therapy as needed.   Chrishelle Zito C, PT, DPT 02/13/2023, 7:20 PM   7:20 PM, 02/13/23 Rosamaria Lints, PT, DPT Physical Therapist - [place] [phone] (Office)  [facility] [address] [place], [place] [address] P: [phone] I F: [phone]

## 2023-02-19 ENCOUNTER — Ambulatory Visit: Payer: Medicare Other | Admitting: Physical Therapy

## 2023-02-19 ENCOUNTER — Encounter: Payer: Self-pay | Admitting: Physical Therapy

## 2023-02-19 DIAGNOSIS — M5459 Other low back pain: Secondary | ICD-10-CM | POA: Diagnosis not present

## 2023-02-19 DIAGNOSIS — R262 Difficulty in walking, not elsewhere classified: Secondary | ICD-10-CM

## 2023-02-19 DIAGNOSIS — M5416 Radiculopathy, lumbar region: Secondary | ICD-10-CM

## 2023-02-19 DIAGNOSIS — R296 Repeated falls: Secondary | ICD-10-CM

## 2023-02-19 DIAGNOSIS — R29818 Other symptoms and signs involving the nervous system: Secondary | ICD-10-CM

## 2023-02-19 DIAGNOSIS — M6281 Muscle weakness (generalized): Secondary | ICD-10-CM

## 2023-02-19 NOTE — Therapy (Signed)
OUTPATIENT PHYSICAL THERAPY TREATMENT / PROGRESS NOTE Dates of reporting from 12/26/2022 to 02/19/2023  Patient Name: DORRION GANDHI MRN: [id number] DOB:27-Jun-1981, 42 y.o., male Today's Date: 02/19/23   END OF SESSION:  PT End of Session - 02/19/23 1919     Visit Number 10    Number of Visits 13    Date for PT Re-Evaluation 03/20/23    Authorization Type MEDICARE PART B reporting period from 12/26/2022    Progress Note Due on Visit 10    PT Start Time 1817    PT Stop Time 1902    PT Time Calculation (min) 45 min    Activity Tolerance Patient limited by fatigue;Patient limited by pain;Patient tolerated treatment well    Behavior During Therapy [facility] for tasks assessed/performed               Past Medical History:  Diagnosis Date   Bowel trouble    urgency   Medical history non-contributory    Urinary urgency    Past Surgical History:  Procedure Laterality Date   BACK SURGERY  2010   CIRCUMCISION     LUMBAR LAMINECTOMY/DECOMPRESSION MICRODISCECTOMY  07/20/2011   Procedure: LUMBAR LAMINECTOMY/DECOMPRESSION MICRODISCECTOMY;  Surgeon: Carmela Hurt;  Location: [facility];  Service: Neurosurgery;  Laterality: N/A;  right thoracotomy with thoracic eight-nine discectomy and fusion   LUMBAR LAMINECTOMY/DECOMPRESSION MICRODISCECTOMY Right 10/06/2021   Procedure: Right Lumbar Four-Five Microdiscectomy, Right Lumbar Five-Sacal One Foraminotomy;  Surgeon: Coletta Memos, MD;  Location: [facility];  Service: Neurosurgery;  Laterality: Right;  3C/RM 21   THORACIC DISCECTOMY  07/16/2012   Procedure: THORACIC DISCECTOMY;  Surgeon: Carmela Hurt, MD;  Location: [facility];  Service: Neurosurgery;  Laterality: Right;  RIGHT Thoracic seven-eight  thoracic diskectomy via thoracotomy by dr Laneta Simmers   THORACIC DISCECTOMY N/A 12/15/2014   Procedure: THORACIC SEVEN TO THORACIC NINE Laminectomy ;  Surgeon: Coletta Memos, MD;  Location: [facility];  Service: Neurosurgery;  Laterality: N/A;   THORACIC  DISCECTOMY N/A 06/06/2017   Procedure: LAMINECTOMY THORACIC NINE-TEN;  Surgeon: Coletta Memos, MD;  Location: [facility];  Service: Neurosurgery;  Laterality: N/A;  LAMINECTOMY THORACIC NINE-TEN   THORACOTOMY  07/20/2011   Procedure: THORACOTOMY OPEN FOR SPINE SURGERY;  Surgeon: Norton Blizzard, MD;  Location: [facility];  Service: Vascular;  Laterality: N/A;   THORACOTOMY  07/16/2012   Procedure: THORACOTOMY OPEN FOR SPINE SURGERY;  Surgeon: Alleen Borne, MD;  Location: [facility];  Service: Thoracic;  Laterality: N/A;   Patient Active Problem List   Diagnosis Date Noted   Nerve pain 12/05/2022   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   S/P lumbar spinal fusion 05/25/2022   Synovial cyst of lumbar facet joint 05/25/2022   HNP (herniated nucleus pulposus), lumbar 10/06/2021   Hand pain 06/28/2021   Lumbar facet arthropathy 01/11/2021   Thoracic spondylosis with myelopathy 01/11/2021   Thoracic spinal stenosis 06/06/2017   Stenosis, spinal, thoracic 12/15/2014   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 09/22/2014   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/2011    PCP: Nira Retort  REFERRING PROVIDER: Ranelle Oyster, MD   REFERRING DIAG: thoracic disc disease with myelopathy, nerve pain, adhesive arachnoiditis  Rationale for Evaluation and Treatment: Rehabilitation  THERAPY DIAG:  Other low back pain  Other symptoms and signs involving the nervous system  Radiculopathy, lumbar region  Muscle weakness (generalized)  Repeated falls  Difficulty in walking, not elsewhere classified  ONSET DATE:  Suddenly started having  weakness 11 years ago, most recent episode of worsening October 2022, s/p right L4-5 laminotomy, microdiscectomy on 10/06/2021, now s/p L4-5 PLIF by Dr. Franky Macho on 05/25/2022.  PERTINENT HISTORY:  Patient is a 42 y.o. male who presents to outpatient physical therapy with a referral for medical diagnosis thoracic disc disease  with myelopathy, nerve pain, adhesive arachnoiditis. This patient's chief complaints consist of disabling low back and R >L leg pain and weakness with activity, bowel and bladder urgency, incontinence, and retention, leading to the following functional deficits: difficulty with or unable to complete any activity that requires weight bearing, use of B LE, and/or balance including working, household and community mobility, walking, driving, going to family gatherings, avoiding incontinent episodes, playing with daughter, helping around the house, bed mobility, transfers. 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 Franky Macho on 05/25/2022, adhesive arachnoiditis, nerve pain.  Patient denies hx of cancer, stroke, seizures, lung problems, heart problems, diabetes, unexplained weight loss, and osteoporosis.  SUBJECTIVE:                                                                                                                                                                                           SUBJECTIVE STATEMENT: Patient states his right groin is still giving him trouble. He states he got a lot of relief from the pain there when the last PT did LAD through his R hip, but the pain came back as soon as he put weight on it. The hip pain also gets worse when he needs to urinate or have a BM (worse with the BM). His urologist appointment got moved to the end of the month since it happened while he was still in the hospital. Patient is still having trouble with migraines. He never had a migraine  until a month ago. He has been doing standing exercises at home, but he does not do exercises that require standing on one leg because it feels like it will give out. He states he thinks he would use the AFO he tried last session if it was not as uncomfortable.   PAIN:  NPRS: 4/10 in right groin, 1/10 chronic low back pain  PRECAUTIONS: Fall  PATIENT GOALS: "To get better, walk, drive again, climb stairs, play with my daughter, help around the  house again"  NEXT MD VISIT: 03/13/2023 follow up with Dr. Riley Kill.   OBJECTIVE  TODAY'S TREATMENT:    Therapeutic exercise: to centralize symptoms and improve ROM, strength, muscular endurance, and activity tolerance required for successful completion of functional activities.  - NuStep level 3 using bilateral upper and lower extremities. Seat/handle setting 11/10; 5 minutes. Average SPM = 63. Able do it without his hands by end of 7 min. Groin pain with hip extension.  - AMB overground BRW 1x140 feet, 2x167ft, throughout session - hooklying SAQ 3x10 each side with 10# AW.  - LTR between sets of other activities with wide stance per patient preference.  - hooklying marching 2x10 bilat modA BLE (wide stance to help decrease hip pain) - sit <> stand from 19.5 inch plinth, 1x5 B UE support on RW.   Manual therapy: to reduce pain and tissue tension, improve range of motion, neuromodulation, in order to promote improved ability to complete functional activities. - LAD to RLE (relieves hip pain) 3x45sec with belt  Pt required multimodal cuing for proper technique and to facilitate improved neuromuscular control, strength, range of motion, and functional ability resulting in improved performance and form.  PATIENT EDUCATION:  Education details: Education on diagnosis, prognosis, POC, anatomy and physiology of current condition. Article handout.  Person educated: patient Education method: Explanation Education comprehension: verbalized understanding and  needs further education  HOME EXERCISE PROGRAM: Verbally:  - seated lumbar flexion stretch with head down and one LE extended using rollator, 1x10 each side.  - curl up with legs elevated, 5x15 seconds - sidelying open book 1x10 each side   ASSESSMENT:  CLINICAL IMPRESSION: Patient has attended 10 physical therapy sessions since starting current episode of care on 12/26/2022. Patient's consistency in attendance has been interrupted by occasional difficulty with transportation, inability to come to visits due to exacerbation of condition, and illness including hospitalization for apparent unknown infection. Since the illness that lead to his hospitalization, he continues to have photosensitivity and migraines that he did not have before about a month ago. Patient did have a steroid injection on 01/14/2023 that improved his right leg pain, which helped him tolerate PT better. He continues to have bilateral LE neurogenic weakness and pain (R > L) that fluctuates with activity and BM/urination status. PT has recommended he get a wheelchair to improve his community engagement for situations that require more than short community mobility and an AFO to assist with walking efficiency, safety, and stability due to difficulty clearing the floor during swing through with the right foot and difficulty with knee stability in stance phase. Patient would also benefit from adaptive driving equipment to improve his community and life engagement, since he is unable to feel or control his feet in a reliable manner for driving. So far, driving assessment and adaptive equipment has been out of his reach financially and he is in the process of getting a w/c evaluation. PT has contacted MD about potential for benefit from AFO. Overall, patient is demonstrating improved tolerance for activities in the clinic and improved ability to ambulate, complete transfers, and prevent falls since starting PT. Continued PT is medically  necessary to maximized pateint's functional independence. Plan to continue with PT visit frequency of 2x a week. Patient would benefit from continued management of limiting condition by skilled physical therapist to address remaining impairments and functional limitations to work towards stated goals and return to PLOF or maximal functional independence.   OBJECTIVE IMPAIRMENTS: Abnormal gait, decreased activity tolerance, decreased  balance, decreased coordination, decreased endurance, decreased knowledge of condition, decreased knowledge of use of DME, decreased mobility, difficulty walking, decreased ROM, decreased strength, increased edema, impaired perceived functional ability, increased muscle spasms, impaired flexibility, impaired sensation, impaired tone, improper body mechanics, postural dysfunction, obesity, and pain.   ACTIVITY LIMITATIONS: carrying, lifting, bending, standing, squatting, stairs, transfers, bed mobility, continence, bathing, toileting, dressing, hygiene/grooming, locomotion level, and caring for others  PARTICIPATION LIMITATIONS: meal prep, cleaning, laundry, interpersonal relationship, driving, shopping, community activity, occupation, yard work, and   difficulty with or unable to complete any activity that requires weight bearing, use of B LE, and/or balance including working, household and community mobility, walking, driving, going to family gatherings, avoiding incontinent episodes, playing with daughter, helping around the house, bed mobility, transfers  PERSONAL FACTORS: Past/current experiences, Time since onset of injury/illness/exacerbation, and 3+ comorbidities:   3 thoracic spine surgeries, thoracic MRI notes "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 Franky Macho on 05/25/2022, adhesive arachnoiditis, nerve pain are also affecting patient's functional outcome.   REHAB POTENTIAL: Fair due to severity and nature of condition.   CLINICAL DECISION MAKING: Evolving/moderate complexity  EVALUATION COMPLEXITY: Moderate   GOALS: Goals reviewed with patient? No  SHORT TERM GOALS: Target date: 01/09/2023  Patient will be independent with initial home exercise program for self-management of symptoms. Baseline: Initial HEP to be provided at visit 2 as appropriate (12/26/22); Goal status: In-progress   LONG TERM GOALS: Target date: 03/20/2023  Patient will be independent with a long-term home exercise program for self-management of symptoms.  Baseline: Initial HEP to be provided at visit 2 as appropriate (12/26/22); participating as able (01/31/2023);  Goal status: In-progress  2.  Patient will demonstrate improved FOTO by equal or greater than 10 points by visit #13 to demonstrate improvement in overall condition and self-reported functional ability.  Baseline: to be tested visit 2 as appropriate (12/26/22); 30 at visit #3 (01/02/2023);  Goal status: In-progress  3.  Patient will demonstrate the ability to ambulate equal or greater than 600 feet with LRAD during the 6 minute walk to improve his household and community mobility.  Baseline: 34 feet with bari-RW (12/26/22); 200 feet with bari-RW (01/31/2023);  Goal status: In-progress  4.  Patient will complete 5 Time Sit To Stand Test from 19.5 inch surface or lower in equal or less than 15 seconds with no UE support to demonstrate improved transfer ability for improved household and toileting mobility.  Baseline: 23 seconds with heavy B UE support on RW from 19.5 inch plinth. Pain throbbing down the right LE.  (12/26/22);  Goal status: In-progress  5.  Patient will report worst pain equal or less  than 3/10 with functional activities to improve his ability to complete basic household and community mobility. Baseline: up to 8/10 (12/26/22); reports 3/10 pain (01/31/2023); reports 4/10 pain (02/19/2023);  Goal status: In-progress   PLAN:  PT FREQUENCY: 1-2x/week  PT DURATION: 12 weeks  PLANNED INTERVENTIONS: Therapeutic exercises, Therapeutic activity, Neuromuscular re-education, Balance training, Gait training, Patient/Family education, Self Care, Joint mobilization, Stair training, Orthotic/Fit training, DME instructions, Aquatic Therapy, Dry Needling, Electrical stimulation, Wheelchair mobility training, Spinal mobilization, Cryotherapy, Moist heat, Manual therapy, and Re-evaluation.  PLAN FOR NEXT SESSION: send referral  to provider for bilat rigid AFO script, update HEP as appropriate, neurodynamics, gait training, LE/core/functional strengthening, stretching, and balance as tolerated, education, manual therapy as needed.   Cira Rue, PT, DPT 02/19/2023, 7:33 PM    [facility] [address] [place], [place] [address] P: [phone] I F: [phone]

## 2023-02-26 ENCOUNTER — Ambulatory Visit: Payer: Medicare Other | Admitting: Physical Therapy

## 2023-02-26 ENCOUNTER — Encounter: Payer: Self-pay | Admitting: Physical Therapy

## 2023-02-26 DIAGNOSIS — M5416 Radiculopathy, lumbar region: Secondary | ICD-10-CM

## 2023-02-26 DIAGNOSIS — R29818 Other symptoms and signs involving the nervous system: Secondary | ICD-10-CM

## 2023-02-26 DIAGNOSIS — M6281 Muscle weakness (generalized): Secondary | ICD-10-CM

## 2023-02-26 DIAGNOSIS — M5459 Other low back pain: Secondary | ICD-10-CM | POA: Diagnosis not present

## 2023-02-26 DIAGNOSIS — R262 Difficulty in walking, not elsewhere classified: Secondary | ICD-10-CM

## 2023-02-26 DIAGNOSIS — R296 Repeated falls: Secondary | ICD-10-CM

## 2023-02-26 NOTE — Therapy (Signed)
OUTPATIENT PHYSICAL THERAPY TREATMENT    Patient Name: Travis Fitzpatrick MRN: [id number] DOB:1980-10-06, 42 y.o., male Today's Date: 02/26/23   END OF SESSION:  PT End of Session - 02/26/23 1822     Visit Number 11    Number of Visits 13    Date for PT Re-Evaluation 03/20/23    Authorization Type MEDICARE PART B reporting period from 12/26/2022    Progress Note Due on Visit 10    PT Start Time 1820    PT Stop Time 1900    PT Time Calculation (min) 40 min    Activity Tolerance Patient limited by fatigue;Patient limited by pain;Patient tolerated treatment well    Behavior During Therapy [facility] for tasks assessed/performed                Past Medical History:  Diagnosis Date   Bowel trouble    urgency   Medical history non-contributory    Urinary urgency    Past Surgical History:  Procedure Laterality Date   BACK SURGERY  2010   CIRCUMCISION     LUMBAR LAMINECTOMY/DECOMPRESSION MICRODISCECTOMY  07/20/2011   Procedure: LUMBAR LAMINECTOMY/DECOMPRESSION MICRODISCECTOMY;  Surgeon: Carmela Hurt;  Location: [facility];  Service: Neurosurgery;  Laterality: N/A;  right thoracotomy with thoracic eight-nine discectomy and fusion   LUMBAR LAMINECTOMY/DECOMPRESSION MICRODISCECTOMY Right 10/06/2021   Procedure: Right Lumbar Four-Five Microdiscectomy, Right Lumbar Five-Sacal One Foraminotomy;  Surgeon: Coletta Memos, MD;  Location: [facility];  Service: Neurosurgery;  Laterality: Right;  3C/RM 21   THORACIC DISCECTOMY  07/16/2012   Procedure: THORACIC DISCECTOMY;  Surgeon: Carmela Hurt, MD;  Location: [facility];  Service: Neurosurgery;  Laterality: Right;  RIGHT Thoracic seven-eight  thoracic diskectomy via thoracotomy by dr Laneta Simmers   THORACIC DISCECTOMY N/A 12/15/2014   Procedure: THORACIC SEVEN TO THORACIC NINE Laminectomy ;  Surgeon: Coletta Memos, MD;  Location: [facility];  Service: Neurosurgery;  Laterality: N/A;   THORACIC DISCECTOMY N/A 06/06/2017   Procedure: LAMINECTOMY THORACIC  NINE-TEN;  Surgeon: Coletta Memos, MD;  Location: [facility];  Service: Neurosurgery;  Laterality: N/A;  LAMINECTOMY THORACIC NINE-TEN   THORACOTOMY  07/20/2011   Procedure: THORACOTOMY OPEN FOR SPINE SURGERY;  Surgeon: Norton Blizzard, MD;  Location: [facility];  Service: Vascular;  Laterality: N/A;   THORACOTOMY  07/16/2012   Procedure: THORACOTOMY OPEN FOR SPINE SURGERY;  Surgeon: Alleen Borne, MD;  Location: [facility];  Service: Thoracic;  Laterality: N/A;   Patient Active Problem List   Diagnosis Date Noted   Nerve pain 12/05/2022   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   S/P lumbar spinal fusion 05/25/2022   Synovial cyst of lumbar facet joint 05/25/2022   HNP (herniated nucleus pulposus), lumbar 10/06/2021   Hand pain 06/28/2021   Lumbar facet arthropathy 01/11/2021   Thoracic spondylosis with myelopathy 01/11/2021   Thoracic spinal stenosis 06/06/2017   Stenosis, spinal, thoracic 12/15/2014   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 09/22/2014   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/2011    PCP: Nira Retort  REFERRING PROVIDER: Ranelle Oyster, MD   REFERRING DIAG: thoracic disc disease with myelopathy, nerve pain, adhesive arachnoiditis  Rationale for Evaluation and Treatment: Rehabilitation  THERAPY DIAG:  Other low back pain  Other symptoms and signs involving the nervous system  Radiculopathy, lumbar region  Muscle weakness (generalized)  Repeated falls  Difficulty in walking, not elsewhere classified  ONSET DATE:  Suddenly started having weakness 11 years ago, most recent episode  of worsening October 2022, s/p right L4-5 laminotomy, microdiscectomy on 10/06/2021, now s/p L4-5 PLIF by Dr. Franky Macho on 05/25/2022.  PERTINENT HISTORY:  Patient is a 42 y.o. male who presents to outpatient physical therapy with a referral for medical diagnosis thoracic disc disease with myelopathy, nerve pain, adhesive arachnoiditis. This  patient's chief complaints consist of disabling low back and R >L leg pain and weakness with activity, bowel and bladder urgency, incontinence, and retention, leading to the following functional deficits: difficulty with or unable to complete any activity that requires weight bearing, use of B LE, and/or balance including working, household and community mobility, walking, driving, going to family gatherings, avoiding incontinent episodes, playing with daughter, helping around the house, bed mobility, transfers. 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 Franky Macho on 05/25/2022, adhesive arachnoiditis, nerve pain.  Patient denies hx of cancer, stroke, seizures, lung problems, heart problems, diabetes, unexplained weight loss, and osteoporosis.  SUBJECTIVE:                                                                                                                                                                                           SUBJECTIVE STATEMENT: Patient states he has been doing pretty good but his R leg occasionally tries to give out suddenly. Hte left leg leg occasionally has a shooting sensation on the left leg. He had some increased weakness for a day and a half after last PT session, but his right hip pain was better. He states he has an appointment with [facility] next Tuesday for an AFO. He sees the urologist on Fridays. He states he found some natural stuff that is supposed to help. A doctor wrote a paper on something he can ask a doctor for do naturally. He has found people on  Facebook and other sites that are saying they have an 80% chance it will work for people. He states it has tumeric for inflammation, CBD for pain, and deer antler velvet for cell growth and other components.   PAIN:  NPRS: 3/10 in right hip and groin region, 1/10 chronic low back pain  PRECAUTIONS: Fall  PATIENT GOALS: "To get better, walk, drive again, climb stairs, play with my daughter, help around the house again"  NEXT MD VISIT: 03/13/2023 follow up with Dr.  Riley Kill.   OBJECTIVE  TODAY'S TREATMENT:    Therapeutic exercise: to centralize symptoms and improve ROM, strength, muscular endurance, and activity tolerance required for successful completion of functional activities.  - NuStep level 4 using bilateral  lower extremities mostly exclusively for 4.5 min and then added UE support for the rest of the time. Seat/handle setting 11/10; 7 minutes. Average SPM = 63.  Groin pain with hip extension.  - AMB overground BRW 1x181 feet, 1x236ft, throughout session - seated LAQ 1x10 each side with 10# AW (increased pull at sciatic nerve on R side was too uncomfortable, so discontinued exercise in this position).  - hooklying SAQ 3x10 each side with 10# AW.  - LTR between sets of other activities with wide stance per patient preference. 2x10-20 reps each direction.  - hooklying marching 2x10 bilat mod-minA BLE (wide stance to help decrease hip pain)  Manual therapy: to reduce pain and tissue tension, improve range of motion, neuromodulation, in order to promote improved ability to complete functional activities. - LAD to RLE (relieves hip pain) 3x45sec with belt  Pt required multimodal cuing for proper technique and to facilitate improved neuromuscular control, strength, range of motion, and functional ability resulting in improved performance and form.  PATIENT EDUCATION:  Education details: Education on diagnosis, prognosis, POC, anatomy and physiology of current condition. Article handout.   Person educated: patient Education method: Explanation Education comprehension: verbalized understanding and needs further education  HOME EXERCISE PROGRAM: Verbally:  - seated lumbar flexion stretch with head down and one LE extended using rollator, 1x10 each side.  - curl up with legs elevated, 5x15 seconds - sidelying open book 1x10 each side   ASSESSMENT:  CLINICAL IMPRESSION: Patient arrives reporting good pain response at R hip to LAD last session but increased weakness for 1.5 days following last PT session. Today's session focused on similar interventions, progressed as tolerated. He continues to get relief from R hip LAD and was able to ambulate further distance at one time. However, he did have increasing difficulty clearing the floor with left LE the further he ambulated but was good at maintaining his balance with his B UE support on RW since his R LE is already weaker than left and does not support him well if the left foot does not complete swing phase. He also continues to require physical assistance for LE placement and management in supine and with marching activity. Patient also reported temporary spinning sensation with going supine to sit, that started to occur after his recent hospitalization. Patient continues to require assistance from PT to appropriately work on strengthening and mobility training. Patient would benefit from continued management of limiting condition by skilled physical therapist to address remaining impairments and functional limitations to work towards stated goals and return to PLOF or maximal functional independence.   OBJECTIVE IMPAIRMENTS: Abnormal gait, decreased activity tolerance, decreased balance, decreased coordination, decreased endurance, decreased knowledge of condition, decreased knowledge of use of DME, decreased mobility, difficulty walking, decreased ROM, decreased strength, increased edema, impaired perceived functional ability, increased  muscle spasms, impaired flexibility, impaired sensation, impaired tone, improper body mechanics, postural dysfunction, obesity, and pain.   ACTIVITY LIMITATIONS: carrying, lifting, bending, standing, squatting, stairs, transfers, bed mobility, continence, bathing, toileting, dressing, hygiene/grooming, locomotion level, and caring for others  PARTICIPATION LIMITATIONS: meal prep, cleaning, laundry, interpersonal relationship, driving, shopping, community activity, occupation, yard work, and   difficulty with or unable to complete any activity that requires weight bearing, use of B LE, and/or balance  including working, household and community mobility, walking, driving, going to family gatherings, avoiding incontinent episodes, playing with daughter, helping around the house, bed mobility, transfers  PERSONAL FACTORS: Past/current experiences, Time since onset of injury/illness/exacerbation, and 3+ comorbidities:   3 thoracic spine surgeries, thoracic MRI notes "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 Franky Macho on 05/25/2022, adhesive arachnoiditis, nerve pain are also affecting patient's functional outcome.   REHAB POTENTIAL: Fair due to severity and nature of condition.   CLINICAL DECISION MAKING: Evolving/moderate complexity  EVALUATION COMPLEXITY: Moderate   GOALS: Goals reviewed with patient? No  SHORT TERM GOALS: Target date: 01/09/2023  Patient will be independent with initial home exercise program for self-management of symptoms. Baseline: Initial HEP to be provided at visit 2 as appropriate (12/26/22); Goal status: In-progress   LONG TERM GOALS: Target date:  03/20/2023  Patient will be independent with a long-term home exercise program for self-management of symptoms.  Baseline: Initial HEP to be provided at visit 2 as appropriate (12/26/22); participating as able (01/31/2023);  Goal status: In-progress  2.  Patient will demonstrate improved FOTO by equal or greater than 10 points by visit #13 to demonstrate improvement in overall condition and self-reported functional ability.  Baseline: to be tested visit 2 as appropriate (12/26/22); 30 at visit #3 (01/02/2023);  Goal status: In-progress  3.  Patient will demonstrate the ability to ambulate equal or greater than 600 feet with LRAD during the 6 minute walk to improve his household and community mobility.  Baseline: 34 feet with bari-RW (12/26/22); 200 feet with bari-RW (01/31/2023);  Goal status: In-progress  4.  Patient will complete 5 Time Sit To Stand Test from 19.5 inch surface or lower in equal or less than 15 seconds with no UE support to demonstrate improved transfer ability for improved household and toileting mobility.  Baseline: 23 seconds with heavy B UE support on RW from 19.5 inch plinth. Pain throbbing down the right LE.  (12/26/22);  Goal status: In-progress  5.  Patient will report worst pain equal or less than 3/10 with functional activities to improve his ability to complete basic household and community mobility. Baseline: up to 8/10 (12/26/22); reports 3/10 pain (01/31/2023); reports 4/10 pain (02/19/2023);  Goal status: In-progress   PLAN:  PT FREQUENCY: 1-2x/week  PT DURATION: 12 weeks  PLANNED INTERVENTIONS: Therapeutic exercises, Therapeutic activity, Neuromuscular re-education, Balance training, Gait training, Patient/Family education, Self Care, Joint mobilization, Stair training, Orthotic/Fit training, DME instructions, Aquatic Therapy, Dry Needling, Electrical stimulation, Wheelchair mobility training, Spinal mobilization, Cryotherapy, Moist heat, Manual therapy, and  Re-evaluation.  PLAN FOR NEXT SESSION: send referral to provider for bilat rigid AFO script, update HEP as appropriate, neurodynamics, gait training, LE/core/functional strengthening, stretching, and balance as tolerated, education, manual therapy as needed.   Cira Rue, PT, DPT 02/26/2023, 7:24 PM    [facility] [address] [place], [place] [address] P: [phone] I F: [phone]

## 2023-02-28 ENCOUNTER — Ambulatory Visit: Payer: Medicare Other | Admitting: Physical Therapy

## 2023-02-28 ENCOUNTER — Encounter: Payer: Self-pay | Admitting: Physical Therapy

## 2023-02-28 DIAGNOSIS — M5459 Other low back pain: Secondary | ICD-10-CM | POA: Diagnosis not present

## 2023-02-28 DIAGNOSIS — R29818 Other symptoms and signs involving the nervous system: Secondary | ICD-10-CM

## 2023-02-28 DIAGNOSIS — M6281 Muscle weakness (generalized): Secondary | ICD-10-CM

## 2023-02-28 DIAGNOSIS — M5416 Radiculopathy, lumbar region: Secondary | ICD-10-CM

## 2023-02-28 DIAGNOSIS — R262 Difficulty in walking, not elsewhere classified: Secondary | ICD-10-CM

## 2023-02-28 NOTE — Therapy (Signed)
OUTPATIENT PHYSICAL THERAPY TREATMENT    Patient Name: Travis Fitzpatrick MRN: [id number] DOB:Jun 05, 1981, 42 y.o., male Today's Date: 02/28/23   END OF SESSION:  PT End of Session - 02/28/23 1911     Visit Number 12    Number of Visits 13    Date for PT Re-Evaluation 03/20/23    Authorization Type MEDICARE PART B reporting period from 12/26/2022    Progress Note Due on Visit 13    PT Start Time 1815    PT Stop Time 1855    PT Time Calculation (min) 40 min    Activity Tolerance Patient limited by fatigue;Patient limited by pain;Patient tolerated treatment well    Behavior During Therapy [facility] for tasks assessed/performed                 Past Medical History:  Diagnosis Date   Bowel trouble    urgency   Medical history non-contributory    Urinary urgency    Past Surgical History:  Procedure Laterality Date   BACK SURGERY  2010   CIRCUMCISION     LUMBAR LAMINECTOMY/DECOMPRESSION MICRODISCECTOMY  07/20/2011   Procedure: LUMBAR LAMINECTOMY/DECOMPRESSION MICRODISCECTOMY;  Surgeon: Carmela Hurt;  Location: [facility];  Service: Neurosurgery;  Laterality: N/A;  right thoracotomy with thoracic eight-nine discectomy and fusion   LUMBAR LAMINECTOMY/DECOMPRESSION MICRODISCECTOMY Right 10/06/2021   Procedure: Right Lumbar Four-Five Microdiscectomy, Right Lumbar Five-Sacal One Foraminotomy;  Surgeon: Coletta Memos, MD;  Location: [facility];  Service: Neurosurgery;  Laterality: Right;  3C/RM 21   THORACIC DISCECTOMY  07/16/2012   Procedure: THORACIC DISCECTOMY;  Surgeon: Carmela Hurt, MD;  Location: [facility];  Service: Neurosurgery;  Laterality: Right;  RIGHT Thoracic seven-eight  thoracic diskectomy via thoracotomy by dr Laneta Simmers   THORACIC DISCECTOMY N/A 12/15/2014   Procedure: THORACIC SEVEN TO THORACIC NINE Laminectomy ;  Surgeon: Coletta Memos, MD;  Location: [facility];  Service: Neurosurgery;  Laterality: N/A;   THORACIC DISCECTOMY N/A 06/06/2017   Procedure: LAMINECTOMY  THORACIC NINE-TEN;  Surgeon: Coletta Memos, MD;  Location: [facility];  Service: Neurosurgery;  Laterality: N/A;  LAMINECTOMY THORACIC NINE-TEN   THORACOTOMY  07/20/2011   Procedure: THORACOTOMY OPEN FOR SPINE SURGERY;  Surgeon: Norton Blizzard, MD;  Location: [facility];  Service: Vascular;  Laterality: N/A;   THORACOTOMY  07/16/2012   Procedure: THORACOTOMY OPEN FOR SPINE SURGERY;  Surgeon: Alleen Borne, MD;  Location: [facility];  Service: Thoracic;  Laterality: N/A;   Patient Active Problem List   Diagnosis Date Noted   Nerve pain 12/05/2022   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   S/P lumbar spinal fusion 05/25/2022   Synovial cyst of lumbar facet joint 05/25/2022   HNP (herniated nucleus pulposus), lumbar 10/06/2021   Hand pain 06/28/2021   Lumbar facet arthropathy 01/11/2021   Thoracic spondylosis with myelopathy 01/11/2021   Thoracic spinal stenosis 06/06/2017   Stenosis, spinal, thoracic 12/15/2014   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 09/22/2014   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/2011    PCP: Nira Retort  REFERRING PROVIDER: Ranelle Oyster, MD   REFERRING DIAG: thoracic disc disease with myelopathy, nerve pain, adhesive arachnoiditis  Rationale for Evaluation and Treatment: Rehabilitation  THERAPY DIAG:  Other low back pain  Other symptoms and signs involving the nervous system  Radiculopathy, lumbar region  Muscle weakness (generalized)  Difficulty in walking, not elsewhere classified  ONSET DATE:  Suddenly started having weakness 11 years ago, most recent episode of worsening  October 2022, s/p right L4-5 laminotomy, microdiscectomy on 10/06/2021, now s/p L4-5 PLIF by Dr. Franky Macho on 05/25/2022.  PERTINENT HISTORY:  Patient is a 42 y.o. male who presents to outpatient physical therapy with a referral for medical diagnosis thoracic disc disease with myelopathy, nerve pain, adhesive arachnoiditis. This patient's chief  complaints consist of disabling low back and R >L leg pain and weakness with activity, bowel and bladder urgency, incontinence, and retention, leading to the following functional deficits: difficulty with or unable to complete any activity that requires weight bearing, use of B LE, and/or balance including working, household and community mobility, walking, driving, going to family gatherings, avoiding incontinent episodes, playing with daughter, helping around the house, bed mobility, transfers. 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 Franky Macho on 05/25/2022, adhesive arachnoiditis, nerve pain.  Patient denies hx of cancer, stroke, seizures, lung problems, heart problems, diabetes, unexplained weight loss, and osteoporosis.  SUBJECTIVE:                                                                                                                                                                                           SUBJECTIVE STATEMENT: Patient arrives with BRW and states he is feeling his usual amount of pain and weakness today. He states he was sore like he had lifted weights yesterday but without increased weakness. He had a lot of doctors appointments today. He is going to start Cymbalta for anxiety.  PAIN:  NPRS: 3/10 in right hip and groin region, 1/10 chronic low back pain  PRECAUTIONS: Fall  PATIENT GOALS: "To get better, walk, drive again, climb stairs, play with my daughter, help around the house again"  NEXT MD VISIT: 03/13/2023 follow up with Dr. Riley Kill.    OBJECTIVE  TODAY'S TREATMENT:    Therapeutic exercise: to centralize symptoms and improve ROM, strength, muscular endurance, and activity tolerance required for successful completion of functional activities.  - NuStep level 4 using bilateral lower extremities and upper extremities. Seat/handle setting 11/10; 7 minutes. Average SPM = 63.   - AMB overground BRW 1x120 feet, supervision - wide stance hooklying LTR 1x10 (Manual therapy / dry needling - see below) - wide stance hooklying LTR 1x10 (improved motion and comfort with knees to left side - hooklying SAQ 3x10 each side  with 15# AW.   Manual therapy: to reduce pain and tissue tension, improve range of motion, neuromodulation, in order to promote improved ability to complete functional activities. HOOKLYING - LAD to RLE (relieves hip pain) 3x45sec with belt SIDELYING with R side up - STM to right glute med and superior glute max region.   Modality:  Dry needling performed to right glute med to decrease pain and spasms along patient's right hip region with patient in sidelying utilizing 1 dry needle(s) .30mm x 75mm with 2 sticks at right gluteus medius and utilizing 1 dry needle(s) .30mm x with 2 sticks at right gluteus medius. Patient educated about the risks and benefits from therapy and verbally consents to treatment. Able to reach deep edge of muscle with 100 mm needle. Dry needling performed by Luretha Murphy. Ilsa Iha PT, DPT who is certified in this technique.   Pt required multimodal cuing for proper technique and to facilitate improved neuromuscular control, strength, range of motion, and functional ability resulting in improved performance and form.  PATIENT EDUCATION:  Education details: Education on diagnosis, prognosis, POC, anatomy and physiology of current condition. Article handout.  Person educated: patient Education method: Explanation Education comprehension: verbalized understanding and needs further  education  HOME EXERCISE PROGRAM: Verbally:  - seated lumbar flexion stretch with head down and one LE extended using rollator, 1x10 each side.  - curl up with legs elevated, 5x15 seconds - sidelying open book 1x10 each side   ASSESSMENT:  CLINICAL IMPRESSION: Patient arrives reporting soreness for one day following last PT session but no increased weakness. He arrives after doing a lot of walking to his doctor's appointments earlier today. Today's session attempted to address his right hip pain with manual therapy and dry needling to the right gluteus medius, which was very tender to palpation. Patient had improved ROM with LTR and able to stand up with less dependence on B UE on RW following manual therapy and dry needling. Plan to assess carry over next session. Patient would benefit from continued management of limiting condition by skilled physical therapist to address remaining impairments and functional limitations to work towards stated goals and return to PLOF or maximal functional independence.   OBJECTIVE IMPAIRMENTS: Abnormal gait, decreased activity tolerance, decreased balance, decreased coordination, decreased endurance, decreased knowledge of condition, decreased knowledge of use of DME, decreased mobility, difficulty walking, decreased ROM, decreased strength, increased edema, impaired perceived functional ability, increased muscle spasms, impaired flexibility, impaired sensation, impaired tone, improper body mechanics, postural dysfunction, obesity, and pain.   ACTIVITY LIMITATIONS: carrying, lifting, bending, standing, squatting, stairs, transfers, bed mobility, continence, bathing, toileting, dressing, hygiene/grooming, locomotion level, and caring for others  PARTICIPATION LIMITATIONS: meal prep, cleaning, laundry, interpersonal relationship, driving, shopping, community activity, occupation, yard work, and   difficulty with or unable to complete any activity that requires  weight bearing, use of B LE, and/or balance including working, household and community mobility, walking, driving, going to family gatherings, avoiding incontinent episodes, playing with daughter, helping around the house, bed mobility, transfers  PERSONAL FACTORS: Past/current experiences, Time since onset of injury/illness/exacerbation, and 3+ comorbidities:   3 thoracic spine surgeries, thoracic MRI notes "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 Franky Macho on 05/25/2022, adhesive  arachnoiditis, nerve pain are also affecting patient's functional outcome.   REHAB POTENTIAL: Fair due to severity and nature of condition.   CLINICAL DECISION MAKING: Evolving/moderate complexity  EVALUATION COMPLEXITY: Moderate   GOALS: Goals reviewed with patient? No  SHORT TERM GOALS: Target date: 01/09/2023  Patient will be independent with initial home exercise program for self-management of symptoms. Baseline: Initial HEP to be provided at visit 2 as appropriate (12/26/22); Goal status: In-progress   LONG TERM GOALS: Target date: 03/20/2023  Patient will be independent with a long-term home exercise program for self-management of symptoms.  Baseline: Initial HEP to be provided at visit 2 as appropriate (12/26/22); participating as able (01/31/2023);  Goal status: In-progress  2.  Patient will demonstrate improved FOTO by equal or greater than 10 points by visit #13 to demonstrate improvement in overall condition and self-reported functional ability.  Baseline: to be tested visit 2 as appropriate (12/26/22); 30 at visit #3 (01/02/2023);  Goal status: In-progress  3.  Patient will demonstrate  the ability to ambulate equal or greater than 600 feet with LRAD during the 6 minute walk to improve his household and community mobility.  Baseline: 34 feet with bari-RW (12/26/22); 200 feet with bari-RW (01/31/2023);  Goal status: In-progress  4.  Patient will complete 5 Time Sit To Stand Test from 19.5 inch surface or lower in equal or less than 15 seconds with no UE support to demonstrate improved transfer ability for improved household and toileting mobility.  Baseline: 23 seconds with heavy B UE support on RW from 19.5 inch plinth. Pain throbbing down the right LE.  (12/26/22);  Goal status: In-progress  5.  Patient will report worst pain equal or less than 3/10 with functional activities to improve his ability to complete basic household and community mobility. Baseline: up to 8/10 (12/26/22); reports 3/10 pain (01/31/2023); reports 4/10 pain (02/19/2023);  Goal status: In-progress   PLAN:  PT FREQUENCY: 1-2x/week  PT DURATION: 12 weeks  PLANNED INTERVENTIONS: Therapeutic exercises, Therapeutic activity, Neuromuscular re-education, Balance training, Gait training, Patient/Family education, Self Care, Joint mobilization, Stair training, Orthotic/Fit training, DME instructions, Aquatic Therapy, Dry Needling, Electrical stimulation, Wheelchair mobility training, Spinal mobilization, Cryotherapy, Moist heat, Manual therapy, and Re-evaluation.  PLAN FOR NEXT SESSION:  update HEP as appropriate, neurodynamics, gait training, LE/core/functional strengthening, stretching, and balance as tolerated, education, manual therapy as needed.   Cira Rue, PT, DPT 02/28/2023, 7:19 PM    [facility] [address] [place], [place] [address] P: [phone] I F: [phone]

## 2023-03-05 ENCOUNTER — Ambulatory Visit: Payer: Medicare Other | Admitting: Physical Therapy

## 2023-03-05 ENCOUNTER — Encounter: Payer: Self-pay | Admitting: Physical Therapy

## 2023-03-05 DIAGNOSIS — M5459 Other low back pain: Secondary | ICD-10-CM | POA: Diagnosis not present

## 2023-03-05 DIAGNOSIS — R29818 Other symptoms and signs involving the nervous system: Secondary | ICD-10-CM

## 2023-03-05 DIAGNOSIS — R296 Repeated falls: Secondary | ICD-10-CM

## 2023-03-05 DIAGNOSIS — M6281 Muscle weakness (generalized): Secondary | ICD-10-CM

## 2023-03-05 DIAGNOSIS — M5416 Radiculopathy, lumbar region: Secondary | ICD-10-CM

## 2023-03-05 DIAGNOSIS — R262 Difficulty in walking, not elsewhere classified: Secondary | ICD-10-CM

## 2023-03-05 NOTE — Therapy (Signed)
OUTPATIENT PHYSICAL THERAPY TREATMENT / PROGRESS NOTE / RE-CERTIFICATION Dates of reporting from 12/26/2022 to 03/05/2023   Patient Name: Travis Fitzpatrick MRN: [id number] DOB:02/21/1981, 42 y.o., male Today's Date: 03/05/23   END OF SESSION:  PT End of Session - 03/05/23 1836     Visit Number 13    Number of Visits 25    Date for PT Re-Evaluation 05/28/23    Authorization Type MEDICARE PART B reporting period from 12/26/2022    Progress Note Due on Visit 20    PT Start Time 1816    PT Stop Time 1905    PT Time Calculation (min) 49 min    Activity Tolerance Patient limited by fatigue;Patient limited by pain;Patient tolerated treatment well    Behavior During Therapy [facility] for tasks assessed/performed             Past Medical History:  Diagnosis Date   Bowel trouble    urgency   Medical history non-contributory    Urinary urgency    Past Surgical History:  Procedure Laterality Date   BACK SURGERY  2010   CIRCUMCISION     LUMBAR LAMINECTOMY/DECOMPRESSION MICRODISCECTOMY  07/20/2011   Procedure: LUMBAR LAMINECTOMY/DECOMPRESSION MICRODISCECTOMY;  Surgeon: Carmela Hurt;  Location: [facility];  Service: Neurosurgery;  Laterality: N/A;  right thoracotomy with thoracic eight-nine discectomy and fusion   LUMBAR LAMINECTOMY/DECOMPRESSION MICRODISCECTOMY Right 10/06/2021   Procedure: Right Lumbar Four-Five Microdiscectomy, Right Lumbar Five-Sacal One Foraminotomy;  Surgeon: Coletta Memos, MD;  Location: [facility];  Service: Neurosurgery;  Laterality: Right;  3C/RM 21   THORACIC DISCECTOMY  07/16/2012   Procedure: THORACIC DISCECTOMY;  Surgeon: Carmela Hurt, MD;  Location: [facility];  Service: Neurosurgery;  Laterality: Right;  RIGHT Thoracic seven-eight  thoracic diskectomy via thoracotomy by dr Laneta Simmers   THORACIC DISCECTOMY N/A 12/15/2014   Procedure: THORACIC SEVEN TO THORACIC NINE Laminectomy ;  Surgeon: Coletta Memos, MD;  Location: [facility];  Service: Neurosurgery;  Laterality:  N/A;   THORACIC DISCECTOMY N/A 06/06/2017   Procedure: LAMINECTOMY THORACIC NINE-TEN;  Surgeon: Coletta Memos, MD;  Location: [facility];  Service: Neurosurgery;  Laterality: N/A;  LAMINECTOMY THORACIC NINE-TEN   THORACOTOMY  07/20/2011   Procedure: THORACOTOMY OPEN FOR SPINE SURGERY;  Surgeon: Norton Blizzard, MD;  Location: [facility];  Service: Vascular;  Laterality: N/A;   THORACOTOMY  07/16/2012   Procedure: THORACOTOMY OPEN FOR SPINE SURGERY;  Surgeon: Alleen Borne, MD;  Location: [facility];  Service: Thoracic;  Laterality: N/A;   Patient Active Problem List   Diagnosis Date Noted   Nerve pain 12/05/2022   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   S/P lumbar spinal fusion 05/25/2022   Synovial cyst of lumbar facet joint 05/25/2022   HNP (herniated nucleus pulposus), lumbar 10/06/2021   Hand pain 06/28/2021   Lumbar facet arthropathy 01/11/2021   Thoracic spondylosis with myelopathy 01/11/2021   Thoracic spinal stenosis 06/06/2017   Stenosis, spinal, thoracic 12/15/2014   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 09/22/2014   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/2011    PCP: Nira Retort  REFERRING PROVIDER: Ranelle Oyster, MD   REFERRING DIAG: thoracic disc disease with myelopathy, nerve pain, adhesive arachnoiditis  Rationale for Evaluation and Treatment: Rehabilitation  THERAPY DIAG:  Other low back pain  Other symptoms and signs involving the nervous system  Radiculopathy, lumbar region  Muscle weakness (generalized)  Repeated falls  Difficulty in walking, not elsewhere classified  ONSET DATE:  Suddenly started  having weakness 11 years ago, most recent episode of worsening October 2022, s/p right L4-5 laminotomy, microdiscectomy on 10/06/2021, now s/p L4-5 PLIF by Dr. Franky Macho on 05/25/2022.  PERTINENT HISTORY:  Patient is a 42 y.o. male who presents to outpatient physical therapy with a referral for medical diagnosis thoracic  disc disease with myelopathy, nerve pain, adhesive arachnoiditis. This patient's chief complaints consist of disabling low back and R >L leg pain and weakness with activity, bowel and bladder urgency, incontinence, and retention, leading to the following functional deficits: difficulty with or unable to complete any activity that requires weight bearing, use of B LE, and/or balance including working, household and community mobility, walking, driving, going to family gatherings, avoiding incontinent episodes, playing with daughter, helping around the house, bed mobility, transfers. 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 Franky Macho on 05/25/2022, adhesive arachnoiditis, nerve pain.  Patient denies hx of cancer, stroke, seizures, lung problems, heart problems, diabetes, unexplained weight loss, and osteoporosis.  SUBJECTIVE:                                                                                                                                                                                           SUBJECTIVE STATEMENT: Patient arrives with BRW and states he felt a lot better after last PT session. He states he can walk better, has less pain, and is stronger. He also has a little more energy. He was fitted for an AFO and they said to come back in 4 weeks to to pick it up. He also saw his urologist that though all of his problems with bladder were related to his back. He did not have any hernias or tumors. Patient started on pills for his bladder, but it might make his blood pressure go  up. His other doctor put him on Cymbalta for anxiety and stress disorder, which he has started and is doing okay with. The Cymbalta helped with pain and he is feeling less worried.   PAIN:  NPRS: 3/10 in right hip and groin region, 1/10 chronic low back pain  PRECAUTIONS: Fall  PATIENT GOALS: "To get better, walk, drive again, climb stairs, play with my daughter, help around the house again"  NEXT MD VISIT: 03/13/2023 follow up with Dr. Riley Kill.   OBJECTIVE  SELF-REPORTED FUNCTION FOTO score: 46/100 (Inflammatory Diseases of the Nervous System) questionnaire)  FUNCTIONAL/BALANCE TESTS: 6 Minute Walk Test: 500 feet with BRW. Increasing pain at right groin to low back with decrease stability that required stopping test at 5:45. Pain up to 6-7/10  Five Time Sit to Stand (5TSTS): 16 seconds with heavy B UE support on RRW from 19.5 inch plinth. Pain throbbing down the right LE.    TODAY'S TREATMENT:    Therapeutic exercise: to centralize symptoms and improve ROM, strength, muscular endurance, and activity tolerance required for successful completion of functional activities.  - 6 Minute Walk Test to assess progress (see above) - 5 Time Sit To Stand Test to assess progress (see above).   Manual therapy: to reduce pain and tissue tension, improve range of motion, neuromodulation, in order to promote improved ability to complete functional activities. HOOKLYING - LAD to RLE (relieves hip pain) 3x45sec with belt SIDELYING with R side up - STM to right glute med and superior glute max region.   Modality:  Dry needling performed to right glute med and superior muscles of glute max to decrease pain and spasms along patient's right hip region with patient in sidelying utilizing 1 dry needle(s) .30mm x with 2 sticks at right gluteus medius and 2 sticks to superior fibers of R glute max. Patient educated about the risks and benefits from therapy and verbally consents to treatment. Able to  reach deep edge of muscle with 100 mm needle. Dry needling performed by Luretha Murphy. Ilsa Iha PT, DPT who is certified in this technique.  Pt required multimodal cuing for proper technique and to facilitate improved neuromuscular control, strength, range of motion, and functional ability resulting in improved performance and form.  PATIENT EDUCATION:  Education details: Education on diagnosis, prognosis, POC, anatomy and physiology of current condition. Article handout.  Person educated: patient Education method: Explanation Education comprehension: verbalized understanding and needs further education  HOME EXERCISE PROGRAM: Verbally:  - seated lumbar flexion stretch with head down and one LE extended using rollator, 1x10 each side.  - curl up with legs elevated, 5x15 seconds - sidelying open book 1x10 each side   ASSESSMENT:  CLINICAL IMPRESSION: Patient has attended 13 physical therapy sessions since starting current episode of care on 12/26/2022. Since starting PT patient has made significant improvements and progress towards goals. He has met his FOTO goal and made good progress to other goals except pian goal which has only improved by 1 point. He has improved his 6 Minute Walk Test from 30 feet to 500 feet and his 5 Times Sit To Stand Test time from 23 seconds to 19 seconds from similar surface with similar UE assistance. Patient continues to be most limited by pain in the right hip/groin and weakness down the right leg that increases with weight bearing activities. He also has significant weakness in the left LE, but it does not vary as much or act as the rate limiting factor to his ability to continue with weight bearing mobility training during PT. He continues to be dependent on heavy UE support on his BRW during standing activities and lacks control of his B LE during mat exercises. Patient has gotten some relief from long axis distraction to the right hip as well as dry needling to the left  glute med region, but it does not carry over to the next session. Pateint is to get an AFO in 4 weeks for the right foot to improve his foot clearance and  effort of walking. He is scheduled for a wheelchair evaluation next week. He has been unable to pursue adaptive driving evaluation or equipment due to financial limitations. Plan to continue with PT at a rate of 1-2x a week for the next 12 weeks with progress note due on visit 20. Patient would benefit from continued management of limiting condition by skilled physical therapist to address remaining impairments and functional limitations to work towards stated goals and return to PLOF or maximal functional independence.   OBJECTIVE IMPAIRMENTS: Abnormal gait, decreased activity tolerance, decreased balance, decreased coordination, decreased endurance, decreased knowledge of condition, decreased knowledge of use of DME, decreased mobility, difficulty walking, decreased ROM, decreased strength, increased edema, impaired perceived functional ability, increased muscle spasms, impaired flexibility, impaired sensation, impaired tone, improper body mechanics, postural dysfunction, obesity, and pain.   ACTIVITY LIMITATIONS: carrying, lifting, bending, standing, squatting, stairs, transfers, bed mobility, continence, bathing, toileting, dressing, hygiene/grooming, locomotion level, and caring for others  PARTICIPATION LIMITATIONS: meal prep, cleaning, laundry, interpersonal relationship, driving, shopping, community activity, occupation, yard work, and   difficulty with or unable to complete any activity that requires weight bearing, use of B LE, and/or balance including working, household and community mobility, walking, driving, going to family gatherings, avoiding incontinent episodes, playing with daughter, helping around the house, bed mobility, transfers  PERSONAL FACTORS: Past/current experiences, Time since onset of injury/illness/exacerbation, and 3+  comorbidities:   3 thoracic spine surgeries, thoracic MRI notes "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 Franky Macho on 05/25/2022, adhesive arachnoiditis, nerve pain are also affecting patient's functional outcome.   REHAB POTENTIAL: Fair due to severity and nature of condition.   CLINICAL DECISION MAKING: Evolving/moderate complexity  EVALUATION COMPLEXITY: Moderate   GOALS: Goals reviewed with patient? No  SHORT TERM GOALS: Target date: 01/09/2023. Target date updated to 05/28/2023 for all unmet goals on 03/05/2023.   Patient will be independent with initial home exercise program for self-management of symptoms. Baseline: Initial HEP to be provided at visit 2 as appropriate (12/26/22); Goal status: MET   LONG TERM GOALS: Target date: 03/20/2023  Patient will be independent with a long-term home exercise program for self-management of symptoms.  Baseline: Initial HEP to be provided at visit 2 as appropriate (12/26/22); participating as able (01/31/2023); patient currently participating as tolerated (03/05/2023);   Goal status: In-progress  2.  Patient will demonstrate improved FOTO by equal or greater than 10 points by visit #13 to demonstrate improvement in overall condition and self-reported functional ability.  Baseline: to be tested visit 2 as appropriate (12/26/22); 30 at visit #3 (01/02/2023); 46 at visit #13 (03/05/2023);  Goal status: MET  3.  Patient will demonstrate the ability to ambulate equal or greater than 600 feet with LRAD during the 6 minute walk to improve his household and community mobility.  Baseline: 34 feet with bari-RW (12/26/22); 200 feet with bari-RW  (01/31/2023); 500 feet with BRW (03/05/2023);  Goal status: In-progress  4.  Patient will complete 5 Time Sit To Stand Test from 19.5 inch surface or lower in equal or less than 15 seconds with no UE support to demonstrate improved transfer ability for improved household and toileting mobility.  Baseline: 23 seconds with heavy B UE support on  RW from 19.5 inch plinth. Pain throbbing down the right LE.  (12/26/22); 19 seconds with heavy B UE support on RW from 19.5 inch plinth (03/05/2023);  Goal status: In-progress  5.  Patient will report worst pain equal or less than 3/10 with functional activities to improve his ability to complete basic household and community mobility. Baseline: up to 8/10 (12/26/22); reports 3/10 pain (01/31/2023); reports 4/10 pain (02/19/2023); up to 7/10 in the last 2 week s(03/05/2023):  Goal status: In-progress   PLAN:  PT FREQUENCY: 1-2x/week  PT DURATION: 12 weeks  PLANNED INTERVENTIONS: Therapeutic exercises, Therapeutic activity, Neuromuscular re-education, Balance training, Gait training, Patient/Family education, Self Care, Joint mobilization, Stair training, Orthotic/Fit training, DME instructions, Aquatic Therapy, Dry Needling, Electrical stimulation, Wheelchair mobility training, Spinal mobilization, Cryotherapy, Moist heat, Manual therapy, and Re-evaluation.  PLAN FOR NEXT SESSION:  update HEP as appropriate, neurodynamics, gait training, LE/core/functional strengthening, stretching, and balance as tolerated, education, manual therapy as needed.   Cira Rue, PT, DPT 03/05/2023, 7:34 PM    [facility] [address] [place], [place] [address] P: [phone] I F: [phone]

## 2023-03-07 ENCOUNTER — Ambulatory Visit: Payer: Medicare Other | Admitting: Physical Therapy

## 2023-03-08 ENCOUNTER — Other Ambulatory Visit: Payer: Self-pay | Admitting: Physical Medicine & Rehabilitation

## 2023-03-08 DIAGNOSIS — G039 Meningitis, unspecified: Secondary | ICD-10-CM

## 2023-03-08 DIAGNOSIS — M792 Neuralgia and neuritis, unspecified: Secondary | ICD-10-CM

## 2023-03-08 DIAGNOSIS — M5104 Intervertebral disc disorders with myelopathy, thoracic region: Secondary | ICD-10-CM

## 2023-03-12 ENCOUNTER — Ambulatory Visit: Payer: Medicare Other | Attending: Physical Medicine & Rehabilitation | Admitting: Physical Therapy

## 2023-03-12 ENCOUNTER — Ambulatory Visit: Payer: Medicare Other | Admitting: Physical Therapy

## 2023-03-12 DIAGNOSIS — R262 Difficulty in walking, not elsewhere classified: Secondary | ICD-10-CM | POA: Diagnosis present

## 2023-03-12 DIAGNOSIS — R2689 Other abnormalities of gait and mobility: Secondary | ICD-10-CM | POA: Diagnosis present

## 2023-03-12 DIAGNOSIS — R296 Repeated falls: Secondary | ICD-10-CM | POA: Insufficient documentation

## 2023-03-12 NOTE — Therapy (Signed)
OUTPATIENT PHYSICAL THERAPY WHEELCHAIR EVALUATION   Patient Name: Travis Fitzpatrick MRN: [id number] DOB:Apr 20, 1981, 42 y.o., male Today's Date: 03/12/2023  END OF SESSION:  PT End of Session - 03/12/23 1359     Visit Number 1    Number of Visits 1    Authorization Type MEDICARE PART A and B    PT Start Time 1358    PT Stop Time 1450    PT Time Calculation (min) 52 min    Equipment Utilized During Treatment Gait belt    Activity Tolerance Patient tolerated treatment well    Behavior During Therapy WFL for tasks assessed/performed             Past Medical History:  Diagnosis Date   Bowel trouble    urgency   Medical history non-contributory    Urinary urgency    Past Surgical History:  Procedure Laterality Date   BACK SURGERY  2010   CIRCUMCISION     LUMBAR LAMINECTOMY/DECOMPRESSION MICRODISCECTOMY  07/20/2011   Procedure: LUMBAR LAMINECTOMY/DECOMPRESSION MICRODISCECTOMY;  Surgeon: Carmela Hurt;  Location: [facility];  Service: Neurosurgery;  Laterality: N/A;  right thoracotomy with thoracic eight-nine discectomy and fusion   LUMBAR LAMINECTOMY/DECOMPRESSION MICRODISCECTOMY Right 10/06/2021   Procedure: Right Lumbar Four-Five Microdiscectomy, Right Lumbar Five-Sacal One Foraminotomy;  Surgeon: Coletta Memos, MD;  Location: [facility];  Service: Neurosurgery;  Laterality: Right;  3C/RM 21   THORACIC DISCECTOMY  07/16/2012   Procedure: THORACIC DISCECTOMY;  Surgeon: Carmela Hurt, MD;  Location: [facility];  Service: Neurosurgery;  Laterality: Right;  RIGHT Thoracic seven-eight  thoracic diskectomy via thoracotomy by dr Laneta Simmers   THORACIC DISCECTOMY N/A 12/15/2014   Procedure: THORACIC SEVEN TO THORACIC NINE Laminectomy ;  Surgeon: Coletta Memos, MD;  Location: [facility];  Service: Neurosurgery;  Laterality: N/A;   THORACIC DISCECTOMY N/A 06/06/2017   Procedure: LAMINECTOMY THORACIC NINE-TEN;  Surgeon: Coletta Memos, MD;  Location: [facility];  Service: Neurosurgery;  Laterality:  N/A;  LAMINECTOMY THORACIC NINE-TEN   THORACOTOMY  07/20/2011   Procedure: THORACOTOMY OPEN FOR SPINE SURGERY;  Surgeon: Algis Downs Karle Plumber, MD;  Location: [facility];  Service: Vascular;  Laterality: N/A;   THORACOTOMY  07/16/2012   Procedure: THORACOTOMY OPEN FOR SPINE SURGERY;  Surgeon: Alleen Borne, MD;  Location: [facility];  Service: Thoracic;  Laterality: N/A;   Patient Active Problem List   Diagnosis Date Noted   Nerve pain 12/05/2022   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   S/P lumbar spinal fusion 05/25/2022   Synovial cyst of lumbar facet joint 05/25/2022   HNP (herniated nucleus pulposus), lumbar 10/06/2021   Hand pain 06/28/2021   Lumbar facet arthropathy 01/11/2021   Thoracic spondylosis with myelopathy 01/11/2021   Thoracic spinal stenosis 06/06/2017   Stenosis, spinal, thoracic 12/15/2014   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 Franky Macho on 05/25/2022, adhesive arachnoiditis, nerve pain." Patient is currently  using a 2WW and rollator within the home. Patient reports about 7 falls resulting in one large scrape on back and 4 near falls. Patient reports that he would like to be able to help cook and meal prep but is currently limited by not being able to stand up for long periods of time.   PRECAUTIONS: Fall  RED FLAGS: None   WEIGHT BEARING RESTRICTIONS No   OCCUPATION: Unemployed - On Disability   PLOF:   Reports occasionally needing assistance with bathroom care particularly showering and toileting, modified independent with walker  PATIENT GOALS: "To get a chair so I can move around safe."    MEDICAL HISTORY:  Primary diagnosis onset: 01/30/2023 (referral date)     Medical Diagnosis with ICD-10 code:  M51.04 (ICD-10-CM) - Thoracic disc disease with myelopathy M79.2 (ICD-10-CM) - Nerve pain G03.9 (ICD-10-CM) - Adhesive arachnoiditis   [] Progressive disease  Relevant future surgeries:     Height: 5\' 10"  Weight: 336  Explain recent changes or trends in weight: Patient reports that within the last month he has lost 13 lbs but is trying to lose more weight     History:  Past Medical History:  Diagnosis Date   Bowel trouble    urgency   Medical history non-contributory    Urinary urgency        Cardio Status:  Functional Limitations:   [x] Intact  []  Impaired      Respiratory Status:  Functional Limitations:   [x] Intact  [] Impaired   [] SOB [] COPD [] O2 Dependent ______LPM  [] Ventilator Dependent  Resp equip:                                                     Objective Measure(s):   Orthotics:   [] Amputee:                                                             [] Prosthesis:        HOME ENVIRONMENT:  [x] House [] Condo/town home [] Apartment [] Asst living [] LTCF         [] Own  [x] Rent   [] Lives alone [x] Lives with others -   wife and daughter                     Hours without assistance:   [x] Home is accessible to patient    - landlord willing to make accomdations of ramp to get  WC into home currently has 4 stairs, carpet and tile mixed around home                            Storage of wheelchair:  [x] In home   [] Other Comments:  COMMUNITY :  TRANSPORTATION:  [] Car [] Chief Strategy Officer [] Adapted w/c Lift []  Ambulance [x] Other: SUV                    [] Sits in wheelchair during transport   Where is w/c stored during transport? Transport attachment [x] T[employer] Downs  []  [employer]  r   [] Self-Driver       Drive while in  Biomedical scientist [] yes [x] no   Employment and/or school:  Specific requirements pertaining to mobility    Patient has days where he has to lay in bed for 3-4 days in a row and is hoping to have a device to make it easier to move room to room be able to play with daughter     Other:  COMMUNICATION:  Verbal Communication  [x] WFL [] receptive [] WFL [] expressive [] Understandable  [] Difficult to understand  [] non-communicative  Primary Language:_English______ 2nd:_____________  Communication provided by:[x] Patient [] Family [] Caregiver [] Translator   [] Uses an augmentative communication device     Manufacturer/Model :       MOBILITY/BALANCE:  Sitting Balance  Standing Balance  Transfers  Ambulation   [] WFL      [] WFL  [] Independent  []  Independent   [x] Uses UE for balance in sitting Comments:  [x] Uses UE/device for stability Comments: 2WW [x]  Min assist  []  Ambulates independently with       device:___________________      []  Mod assist  []  Able to ambulate ______ feet        safely/functionally/independently   []  Min assist  []  Min assist  []  Max assist  [x]  Non-functional ambulator         History/High risk of falls   []  Mod assist  []  Mod assist  []  Dependent  []  Unable to ambulate   []  Max  assist  []  Max assist  Transfer method:[] 1 person [] 2 person [] sliding board [] squat pivot [x] stand pivot [] mechanical patient lift  [] other:   []  Unable  []  Unable    Fall History: # of falls in the past 6 months? 7 # of "near" falls in the past 6  months? 4    CURRENT SEATING / MOBILITY:  Current Mobility Device: [] None [x] Cane/Walker - 2WW and Rollator [] Manual [] Dependent [] Dependent w/ Tilt rScooter  [] Power (type of control):   Manufacturer:  Unknown Model:  Serial #:   Size:  Color:  Age:   Purchased by whom:   Current condition of mobility base:    Current seating system:                                                                       Age of seating system:    Describe posture in present seating system:    Is the current mobility meeting medical necessity?:  [] Yes [x] No Describe: Patient at a high risk for falls and causing increased shoulder pain; patient reports difficulty getting up from low surfaces which he is having to do with transfers, Patient has had 7 falls due to lack of safety of current system                            Ability to complete Mobility-Related Activities of Daily Living (MRADL's) with Current  Mobility Device:   Move room to room  [] Independent  [x] Min [] Mod [] Max assist  [] Unable  Comments: Requires use of grabber, sock aid, shoe horn, on bad days needs wife to help bath him, sometimes needs assistance with bowel management to fully clean himself  Meal prep  [] Independent  [] Min [x] Mod [] Max assist  [] Unable    Feeding  [x] Independent  [] Min [] Mod [] Max assist  [] Unable    Bathing  [] Independent  [] Min [x] Mod [] Max assist  [] Unable    Grooming  [x] Independent  [] Min [] Mod [] Max assist  [] Unable    UE dressing  [x] Independent  [] Min [] Mod [] Max assist  [] Unable    LE dressing  [] Independent   [x] Min [] Mod [] Max assist  [] Unable    Toileting  [] Independent  [x] Min [] Mod [] Max assist  [] Unable    Bowel Mgt: [x]  Continent []  Incontinent []  Accidents []  Diapers []  Colostomy []  Bowel Program:  Bladder Mgt: [x]  Continent []  Incontinent []  Accidents []  Diapers []  Urinal []  Intermittent Cath []  Indwelling Cath []  Supra-pubic Cath     Current Mobility Equipment Trialed/ Ruled Out:    Does not meet  mobility needs due to:    Mark all boxes that indicate inability to use the specific equipment listed     Meets needs for safe  independent functional  ambulation  / mobility    Risk of  Falling or History of Falls    Enviromental limitations      Cognition    Safety concerns with  physical ability    Decreased / limitations endurance  & strength     Decreased / limitations  motor skills  & coordination    Pain    Pace /  Speed    Cardiac and/or  respiratory condition    Contra - indicated by diagnosis   Cane/Crutches  []   [x]   []   []   [x]   [x]   [x]   [x]   [x]   []   []    Walker / Rollator  []  NA   []   [x]   []   []   [x]   [x]   [x]   [x]   [x]   []   []     Manual Wheelchair [id number]:  []  NA  []   []   []   []   [x]   []   []   [x]   [x]   []   []    Manual W/C (K0005) with power assist  []  NA  []   []   []   []   [x]   []   []   [x]   []   []   []    Scooter  []  NA  []   []   [x]   []   []   []   []   [x]   []   []   []    Power Wheelchair: standard joystick  []  NA  [x]   []   []   []   []   []   []   []   []   []   []    Power Wheelchair: alternative controls  []  NA  []   []   []   []   []   []   []   []   []   []   []    Summary:  The least costly alternative for independent functional mobility was found to be:    []  Crutch/Cane  []  Walker []  Manual w/c  []  Manual w/c with power assist   []  Scooter   [x]  Power w/c std joystick   []  Power w/c alternative control        []  Requires dependent care mobility Market researcher for [employer] are adequate  for safe mobility equipment operation  [x]   Yes []   No  Patient is willing and motivated to use recommended mobility equipment  [x]   Yes []   No       []  Patient is unable to safely operate mobility equipment independently and requires dependent care equipment Comments:           SENSATION and SKIN ISSUES:  Sensation []  Intact  [x]  Impaired []  Absent []  Hyposensate []  Hypersensate  []  Defensiveness  Location(s) of impairment:  Right leg  worse than Left decreased sensation form hips and below related to spinal deficits   Pressure Relief Method(s):  [x]  Lean side to side to offload (without risk of falling)  []   W/C push up (4+ times/hour for 15+ seconds) []  Stand up (without risk of falling)    []  Other: (Describe): Effective pressure relief method(s) above can be performed consistently throughout the day: [x] Yes  []  No If not, Why?:  Skin Integrity Risk:       []  Low risk           []  Moderate risk            [x]  High risk  If high risk, explain: Prolonged sitting in new chair and baseline decreased sensation  Skin Issues/Skin Integrity  Current skin Issues  []  Yes [x]  No [x]  Intact  []   Red area   []   Open area  []  Scar tissue  []  At risk from prolonged sitting  Where: History of Skin Issues  []  Yes [x]  No Where : When: Stage: Hx of skin flap surgeries  []  Yes [x]  No Where:  When:  Pain: [x]  Yes []  No   Pain Location(s): groin around to R hip and low back and radiating pain down from things into calves, bilateral shoulders particularly with use with 2WW Intensity scale: (0-10): 5/10 Currently and 8-9/10 at worst How does pain interfere with mobility and/or MRADLs? - contributing to falls    MAT EVALUATION:  Neuro-Muscular Status: (Tone, Reflexive, Responses, etc.)     []   Intact   [x]  Spasticity:  []  Hypotonicity  []  Fluctuating  []  Muscle Spasms  []  Poor Righting Reactions/Poor Equilibrium Reactions  []  Primal Reflex(s):    Comments:            COMMENTS:    POSTURE:     Comments:  Pelvis Anterior/Posterior:  []  Neutral   [x]  Posterior  []  Anterior  []  Fixed - No movement []  Tendency away from neutral []  Flexible []  Self-correction []  External correction Obliquity (viewed from front)  [x]  WFL []  R Obliquity []  L Obliquity  []  Fixed - No movement []  Tendency away from neutral []  Flexible []  Self-correction []  External correction Rotation  [x]  WFL []  R anterior []  L  anterior  []  Fixed - No movement []  Tendency away from neutral []  Flexible []  Self-correction []  External correction Tonal Influence Pelvis:  []  Normal []  Flaccid []  Low tone [x]  Spasticity []  Dystonia []  Pelvis thrust []  Other:    Trunk Anterior/Posterior:  []  WFL [x]  Thoracic kyphosis []  Lumbar lordosis  []  Fixed - No movement []  Tendency away from neutral []  Flexible []  Self-correction []  External correction  [x]  WFL []  Convex to left  []  Convex to right []  S-curve   []  C-curve []  Multiple curves []  Tendency away from neutral []  Flexible []  Self-correction []  External correction Rotation of shoulders and upper trunk:  []  Neutral [x]  Left-anterior [x]  Right- anterior []  Fixed- no movement []   Tendency away from neutral []  Flexible []  Self correction []  External correction Tonal influence Trunk:  []  Normal []  Flaccid []  Low tone [x]  Spasticity []  Dystonia []  Other:   Head & Neck  [x]  Functional []  Flexed    []  Extended []  Rotated right  []  Rotated left []  Laterally flexed right []  Laterally flexed left []  Cervical hyperextension   [x]  Good head control []  Adequate head control []  Limited head control []  Absent head control Describe tone/movement of head and neck:  No major neck deficits      Lower Extremity Measurements: LE ROM:  Active ROM Right 03/12/2023 Left 03/12/2023  Hip flexion 95 degrees 100 degres  Hip extension    Hip abduction    Hip adduction    Knee flexion 100 degrees 100 degress  Knee extension [facility] WFL  Ankle dorsiflexion 0 3  Ankle plantarflexion     (Blank rows = not tested)  LE MMT:  MMT Right 03/12/2023 Left 03/12/2023  Hip flexion 2+/5 2+/5  Hip extension    Hip abduction    Hip adduction    Knee flexion 3-/5 3/5  Knee extension 3-/5 3/5  Ankle dorsiflexion 2-/5 2-/5  Ankle plantarflexion     (Blank rows = not tested)  Hip positions:  []  Neutral   []  Abducted   [x]  Adducted  []  Subluxed   []   Dislocated   []  Fixed   []  Tendency away from neutral [x]  Flexible [x]  Self-correction []  External correction   Hip Windswept:[x]  Neutral  []  Right    []  Left  []  Subluxed   []  Dislocated   []  Fixed   []  Tendency away from neutral [x]  Flexible [x]  Self-correction []  External correction  LE Tone: []  Normal [x]  Low tone []  Spasticity []  Flaccid []  Dystonia []  Rocks/Extends at hip []  Thrust into knee extension []  Pushes legs downward into footrest  Foot positioning: ROM Concerns: Dorsiflexed: []  Right   []  Left Plantar flexed: [x]  Right    []  Left Inversion: []  Right    []  Left Eversion: [x]  Right    [x]  Left  LE Edema: [x]  1+ (Barely detectable impression when finger is pressed into skin) []  2+ (slight indentation. 15 seconds to rebound) []  3+ (deeper indentation. 30 seconds to rebound) []  4+ (>30 seconds to rebound)  UE Measurements:  UPPER EXTREMITY ROM:   Grossly WFL  UPPER EXTREMITY MMT:   Grossly WLF - sometimes limited by pinching in shoulders from use particularly with transfers  Shoulder Posture:  Right Tendency towards Left  [x]   Functional [x]    []   Elevation []    []   Depression []    []   Protraction []    []   Retraction []    []   Internal rotation []    []   External rotation []    []   Subluxed []     UE Tone: [x]  Normal []  Flaccid []  Low tone []  Spasticity  []  Dystonia []  Other:   UE Edema: [x]  1+ (Barely detectable impression when finger is pressed into skin) []  2+ (slight indentation. 15 seconds to rebound) []  3+ (deeper indentation. 30 seconds to rebound) []  4+ (>30 seconds to rebound)  Wrist/Hand: Handedness: [x]  Right   []  Left   []  NA: Comments:  Right  Left  [x]   WNL [x]    []   Limitations []    []   Contractures []    []   Fisting []    []   Tremors []    []   Weak grasp []    []   Poor dexterity []    []   Hand movement non functional []    []   Paralysis []      TUG: 34.74 seconds with 2WW (Falls Risk)   Baseline Pain: 5/10  Post  Test Pain: 6/10 increased with very short distance walking   MOBILITY BASE RECOMMENDATIONS and JUSTIFICATION:  MOBILITY BASE  JUSTIFICATION   Manufacturer:   Games developer:    [employer]                          Color:  Red  Seat Width:  20" Seat Depth: 20"   []  Manual mobility base (continue below)   []  Scooter/POV  [x]  Power mobility base   Number of hours per day spent in above selected mobility base:  4 hours  Typical daily mobility base use Schedule:    [x]  is not a safe, functional ambulator  [x]  limitation prevents from completing a MRADL(s) within a reasonable time frame    [x]  limitation places at high risk of morbidity or mortality secondary to  the attempts to perform a    MRADL(s)  []  limitation prevents accomplishing a MRADL(s) entirely  [x]  provide independent mobility  [x]  equipment is a lifetime medical need  [x]  walker or cane inadequate  [x]  any type manual wheelchair      inadequate  [x]  scooter/POV inadequate      []  requires dependent mobility         POWER MOBILITY      []  Scooter/POV    []  can safely operate   []  can safely transfer   []  has adequate trunk stability   []  cannot functionally propel  manual wheelchair    [x]  Power mobility base    [x]  non-ambulatory in a way that is functionally safe [x]  cannot functionally propel manual wheelchair   [x]  cannot functionally and safely      operate scooter/POV  [x]  can safely operate power       wheelchair  [x]  home is accessible  [x]  willing to use power wheelchair     Tilt  []  Powered tilt on powered chair  []  Powered tilt on manual chair  []  Manual tilt on manual chair Comments:  []  change position for pressure      []  elief/cannot weight shift   []  change position against      gravitational force on head and      shoulders   []  decrease pain  []  blood pressure management   []  control autonomic dysreflexia  []  decrease respiratory distress  []  management of spasticity  []  management of low  tone  []  facilitate postural control   []  rest periods   []  control edema  []  increase sitting tolerance   []  aid with transfers     Recline   []  Power recline on power chair  []  Manual recline on manual chair  Comments:    []  intermittent catheterization  []  manage spasticity  []  accommodate femur to back angle  []  change position for pressure relief/cannot weight shift rhigh risk of pressure sore development  []  tilt alone does not accomplish     effective pressure relief, maximum pressure relief achieved at -      _______ degrees tilt   _______ degrees recline   []  difficult to transfer to and from bed []  rest periods and sleeping in chair  []  repositioning for transfers  []  bring to full recline for ADL care  []  clothing/diaper changes in chair  []  gravity PEG  tube feeding  []  head positioning  []  decrease pain  []  blood pressure management   []  control autonomic dysreflexia  []  decrease respiratory distress  []  user on ventilator     Elevator on mobility base  []  Power wheelchair  []  Scooter  []  increase Indep in transfers   []  increase Indep in ADLs    []  bathroom function and safety  []  kitchen/cooking function and safety  []  shopping  []  raise height for communication at standing level  []  raise height for eye contact which reduces cervical neck strain and pain  []  drive at raised height for safety and navigating crowds  []  Other:   []  Vertical position system  (anterior tilt)     (Drive locks-out)    []  Stand       (Drive enabled)  []  independent weight bearing  []  decrease joint contractures  []  decrease/manage spasticity  []  decrease/manage spasms  []  pressure distribution away from   scapula, sacrum, coccyx, and ischial tuberosity  []  increase digestion and elimination   []  access to counters and cabinets  []  increase reach  []  increase interaction with others at eye level, reduces neck strain  []  increase performance of       MRADL(s)      Power  elevating legrest    []  Center mount (Single) 85-170 degrees       []  Standard (Pair) 100-170 degrees  []  position legs at 90 degrees, not available with std power ELR  []  center mount tucks into chair to decrease turning radius in home, not available with std power ELR  []  provide change in position for LE  []  elevate legs during recline    []  maintain placement of feet on      footplate  []  decrease edema  []  improve circulation  []  actuator needed to elevate legrest  []  actuator needed to articulate legrest preventing knees from flexing  []  Increase ground clearance over      curbs  []   STD (pair) independently                     elevate legrest   POWER WHEELCHAIR CONTROLS      Controls/input device  []  Expandable  []  Non-expandable  []  Proportional  []  Right Hand []  Left Hand  []  Non-proportional/switches/head-array  []  Electrical/proximity         []   Mechanical      Manufacturer:___________________   Type:________________________ []  provides access for controlling wheelchair  []  programming for accurate control  []  progressive disease/changing condition  []  required for alternative drive      controls       []  lacks motor control to operate  proportional drive control  []  unable to understand proportional controls  []  limited movement/strength  []  extraneous movement / tremors / ataxic / spastic       []  Upgraded electronics controller/harness    []  Single power (tilt or recline)   []  Expandable    []  Non-expandable plus   []  Multi-power (tilt, recline, power legrest, power seat lift, vertical positioning system, stand)  []  allows input device to communicate with drive motors  []  harness provides necessary connections between the controller, input device, and seat functions     []  needed in order to operate power seat functions through joystick/ input device  []  required for alternative drive controls     []  Enhanced display  []  required to connect all alternative  drive controls   []   required for upgraded joystick      (lite-throw, heavy duty, micro)  []  Allows user to see in which mode and drive the wheelchair is set; necessary for alternate controls       []  Upgraded tracking electronics  []  correct tracking when on uneven surfaces makes switch driving more efficient and less fatiguing  []  increase safety when driving  []  increase ability to traverse thresholds    []  Safety / reset / mode switches     Type:    []  Used to change modes and stop the wheelchair when driving     [x]  Mount for joystick / input device/switches  [x]  swing away for access or transfers   [x]  attaches joystick / input device / switches to wheelchair   []  provides for consistent access  []  midline for optimal placement    []  Attendant controlled joystick plus     mount  []  safety  []  long distance driving  []  operation of seat functions  []  compliance with transportation regulations    [x]  Battery  [x]  required to power (power assist / scooter/ power wc / other):   []  Power inverter (24V to 12V)  []  required for ventilator / respiratory equipment / other:     CHAIR OPTIONS MANUAL & POWER      Armrests   [x]  adjustable height []  removable  []  swing away []  fixed  [x]  flip back  []  reclining  [x]  full length pads []  desk []  tube arms []  gel pads  [x]  provide support with elbow at 90    [x]  remove/flip back/swing away for  transfers  [x]  provide support and positioning of upper body    [x]  allow to come closer to table top  []  remove for access to tables  []  provide support for w/c tray  [x]  change of height/angles for variable activities   []  Elbow support / Elbow stop  []  keep elbow positioned on arm pad  []  keep arms from falling off arm pad  during tilt and/or recline   Upper Extremity Support  []  Arm trough  []   R  []   L  Style:  []  swivel mount []  fixed mount   []  posterior hand support  []   tray  []  full tray  []  joystick cut out  []   R  []   L  Style:  []   decrease gravitational pull on      shoulders  []  provide support to increase UE  function  []  provide hand support in natural    position  []  position flaccid UE  []  decrease subluxation    []  decrease edema       []  manage spasticity   []  provide midline positioning  []  provide work surface  []  placement for AAC/ Computer/ EADL       Hangers/ Legrests   []  ______ degree  []  Elevating []  articulating  []  swing away []  fixed []  lift off  []  heavy duty  []  adjustable knee angle  []  adjustable calf panel   []  longer extension tube              []  provide LE support  []  maintain placement of feet on      footplate   []  accommodate lower leg length  []  accommodate to hamstring       tightness  []  enable transfers  []  provide change in position for LE's  []  elevate legs during recline    []  decrease  edema  []  durability      Foot support   [x]  footplate []  R []  L [x]  flip up           [x]  Depth adjustable   [x]  angle adjustable  []  foot board/one piece    [x]  provide foot support  [x]  accommodate to ankle ROM  [x]  allow foot to go under wheelchair base  []  enable transfers     []  Shoe holders  []  position foot    []  decrease / manage spasticity  []  control position of LE  []  stability    []  safety     []  Ankle strap/heel      loops  []  support foot on foot support  []  decrease extraneous movement  []  provide input to heel   []  protect foot     []  Amputee adapter []  R  []  L     Style:                  Size:  []  Provide support for stump/residual extremity    []  Transportation tie-down  []  to provide crash tested tie-down brackets    []  Crutch/cane holder    []  O2 holder    []  IV hanger   []  Ventilator tray/mount    []  stabilize accessory on wheelchair       Component  Justification     [x]  Seat cushion - Captain Seat     [x]  accommodate impaired sensation  []  decubitus ulcers present or history  []  unable to shift weight  []  increase pressure distribution  []   prevent pelvic extension  []  custom required "off-the-shelf"    seat cushion will not accommodate deformity  []  stabilize/promote pelvis alignment  []  stabilize/promote femur alignment  []  accommodate obliquity  []  accommodate multiple deformity  []  incontinent/accidents  []  low maintenance     []  seat mounts                 []  fixed []  removable  []  attach seat platform/cushion to wheelchair frame    []  Seat wedge    []  provide increased aggressiveness of seat shape to decrease sliding  down in the seat  []  accommodate ROM        []  Cover replacement   []  protect back or seat cushion  []  incontinent/accidents    []  Solid seat / insert    []  support cushion to prevent      hammocking  []  allows attachment of cushion to mobility base    []  Lateral pelvic/thigh/hip     support (Guides)     []  decrease abduction  []  accommodate pelvis  []  position upper legs  []  accommodate spasticity  []  removable for transfers     []  Lateral pelvic/thigh      supports mounts  []  fixed   []  swing-away   []  removable  []  mounts lateral pelvic/thigh supports     []  mounts lateral pelvic/thigh supports swing-away or removable for transfers    []  Medial thigh support ([place]ommel)  [] decrease adduction  [] accommodate ROM  []  remove for transfers   []  alignment      []  Medial thigh   []  fixed      support mounts      []  swing-away   []  removable  []  mounts medial thigh supports   []  Mounts medial supports swing- away or removable for transfers       Component  Justification   []  Back       []   provide posterior trunk support []  facilitate tone  []  provide lumbar/sacral support []  accommodate deformity  []  support trunk in midline   []  custom required "off-the-shelf" back support will not accommodate deformity   []  provide lateral trunk support []  accommodate or decrease tone            []  Back mounts  []  fixed  []  removable  []  attach back rest/cushion to wheelchair frame   []  Lateral trunk       supports  []  R []  L  []  decrease lateral trunk leaning  []  accommodate asymmetry    []  contour for increased contact  []  safety    []  control of tone    []  Lateral trunk      supports mounts  []  fixed  []  swing-away   []  removable  []  mounts lateral trunk supports     []  Mounts lateral trunk supports swing-away or removable for transfers   []  Anterior chest      strap, vest     []  decrease forward movement of shoulder  []  decrease forward movement of trunk  []  safety/stability  []  added abdominal support  []  trunk alignment  []  assistance with shoulder control   []  decrease shoulder elevation    []  Headrest      []  provide posterior head support  []  provide posterior neck support  []  provide lateral head support  []  provide anterior head support  []  support during tilt and recline  []  improve feeding     []  improve respiration  []  placement of switches  []  safety    []  accommodate ROM   []  accommodate tone  []  improve visual orientation   []  Headrest           []  fixed []  removable []  flip down      Mounting hardware   []  swing-away laterals/switches  []  mount headrest   []  mounts headrest flip down or  removable for transfers  []  mount headrest swing-away laterals   []  mount switches     []  Neck Support    []  decrease neck rotation  []  decrease forward neck flexion   Pelvic Positioner    []  std hip belt          []  padded hip belt  []  dual pull hip belt  []  four point hip belt  []  stabilize tone  []  decrease falling out of chair  []  prevent excessive extension  []  special pull angle to control      rotation  []  pad for protection over boney   prominence  []  promote comfort    []  Essential needs        bag/pouch   []  medicines []  special food rorthotics []  clothing changes  []  diapers  []  catheter/hygiene []  ostomy supplies   The above equipment has a life- long use expectancy.  Growth and changes in medical and/or functional conditions would be the exceptions.    SUMMARY:  Why mobility device was selected; include why a lower level device is not appropriate:   ASSESSMENT:  CLINICAL IMPRESSION: Patient is a 42 y.o. male who was seen today for physical therapy evaluation and treatment for wheelchair assessment. PMH of cauda equina, thoracic myelopathy, bilateral shoulder pain, and adhesive arachnoiditis. Patient is currently using a rollator and 2WW for mobility; however, currently mobility devices are entirely unsafe and not meeting patient's current medical needs. Patient reports over 7 falls in last 6 months and 4 additional near falls.  TUG speed of 34.7 seconds also indicates increased risk for falls. Patient is unfit for scooter as patient is needing mobility device in home and will not provide proper positioning support to decrease pain. Patient reports baseline pain in bilateral shoulders and would only be exacerbated with use of manual chair. Patient also reports difficulty getting up from low surfaces and has intermittent bilateral LE edema. Patient also presents with notable LE weakness and hypertonicity also impacting safety. For these reasons, the [place] [facility] is a medical necessity to meet patient's current medical needs and accommodate body habitus. Patient will benefit form device to more easily access home and play with his young daughter. It will also help maximize patient independence and decrease caregiver burden. For these reasons, therapist recommends this power wheelchair.   OBJECTIVE IMPAIRMENTS decreased balance, difficulty walking, decreased ROM, decreased strength, increased edema, increased muscle spasms, impaired sensation, impaired UE functional use, and pain.   ACTIVITY LIMITATIONS carrying, lifting, standing, squatting, stairs, transfers, bathing, toileting, locomotion level, and caring for others  PARTICIPATION LIMITATIONS: meal prep, cleaning, laundry, community activity, and yard work  PERSONAL FACTORS Fitness, Social  background, and 3+ comorbidities: see above  are also affecting patient's functional outcome.   CLINICAL DECISION MAKING: Stable/uncomplicated  EVALUATION COMPLEXITY: Low                                   GOALS: One time visit. No goals established.  PLAN: PT FREQUENCY: one time visit  Carmelia Bake, PT, DPT 03/12/2023, 4:22 PM    I concur with the above findings and recommendations of the therapist:  Physician name printed:         Physician's signature:      Date:

## 2023-03-13 ENCOUNTER — Encounter: Payer: Self-pay | Admitting: Physical Medicine & Rehabilitation

## 2023-03-13 ENCOUNTER — Encounter: Payer: Medicare Other | Attending: Physical Medicine and Rehabilitation | Admitting: Physical Medicine & Rehabilitation

## 2023-03-13 VITALS — BP 128/80 | HR 103 | Ht 70.0 in | Wt 337.0 lb

## 2023-03-13 DIAGNOSIS — M792 Neuralgia and neuritis, unspecified: Secondary | ICD-10-CM

## 2023-03-13 DIAGNOSIS — M5104 Intervertebral disc disorders with myelopathy, thoracic region: Secondary | ICD-10-CM

## 2023-03-13 DIAGNOSIS — G039 Meningitis, unspecified: Secondary | ICD-10-CM | POA: Diagnosis not present

## 2023-03-13 MED ORDER — DICLOFENAC SODIUM 75 MG PO TBEC
75.0000 mg | DELAYED_RELEASE_TABLET | Freq: Two times a day (BID) | ORAL | 5 refills | Status: DC
Start: 2023-03-13 — End: 2023-09-16

## 2023-03-13 MED ORDER — NORTRIPTYLINE HCL 25 MG PO CAPS
25.0000 mg | ORAL_CAPSULE | Freq: Every day | ORAL | 4 refills | Status: DC
Start: 2023-03-13 — End: 2023-06-25

## 2023-03-13 NOTE — Progress Notes (Signed)
Subjective:    Patient ID: Travis Fitzpatrick, male    DOB: 1981/07/23, 42 y.o.   MRN: [id number]  HPI  Deree is here in follow up of his thoracic myelopathy and adhesive capsulitis. He says that he's been taking supplements which have helped with his pain including turmeric and others.  He continues on gabapentin 600 mg 4 times daily and nortriptyline 10 mg at bedtime.  Is also on Cymbalta 30 mg daily.  He stopped the diclofenac as he called in a refill was not sent in.  He notices in the morning when the gabapentin and nortriptyline wear off.  Pains in his low back and radiating down both legs.  He has been working hard in outpatient therapy improving his gait instability.  He feels more steady on his feet and overall his mobility has improved quite a bit.     Pain Inventory Average Pain 5 Pain Right Now 3 My pain is sharp, burning, stabbing, and aching  In the last 24 hours, has pain interfered with the following? General activity 3 Relation with others 2 Enjoyment of life 2 What TIME of day is your pain at its worst? morning  Sleep (in general) Fair  Pain is worse with: walking, bending, and standing Pain improves with: rest, heat/ice, and medication Relief from Meds:  fair  Family History  Problem Relation Age of Onset   Hypertension Father    Social History   Socioeconomic History   Marital status: Single    Spouse name: Not on file   Number of children: Not on file   Years of education: Not on file   Highest education level: Not on file  Occupational History   Not on file  Tobacco Use   Smoking status: Never   Smokeless tobacco: Never  Vaping Use   Vaping status: Never Used  Substance and Sexual Activity   Alcohol use: No    Alcohol/week: 0.0 standard drinks of alcohol   Drug use: No   Sexual activity: Never  Other Topics Concern   Not on file  Social History Narrative   Not on file   Social Determinants of Health   Financial Resource Strain: Medium  Risk (08/30/2022)   Received from [facility], [facility]   Overall Financial Resource Strain (CARDIA)    Difficulty of Paying Living Expenses: Somewhat hard  Food Insecurity: Food Insecurity Present (08/30/2022)   Received from [facility], [facility]   Hunger Vital Sign    Worried About Running Out of Food in the Last Year: Never true    Ran Out of Food in the Last Year: Sometimes true  Transportation Needs: No Transportation Needs (01/25/2023)   Received from [facility], [facility]   [facility] - Transportation    Lack of Transportation (Medical): No    Lack of Transportation (Non-Medical): No  Physical Activity: Not on file  Stress: Not on file  Social Connections: Not on file   Past Surgical History:  Procedure Laterality Date   BACK SURGERY  2010   CIRCUMCISION     LUMBAR LAMINECTOMY/DECOMPRESSION MICRODISCECTOMY  07/20/2011   Procedure: LUMBAR LAMINECTOMY/DECOMPRESSION MICRODISCECTOMY;  Surgeon: Carmela Hurt;  Location: [facility];  Service: Neurosurgery;  Laterality: N/A;  right thoracotomy with thoracic eight-nine discectomy and fusion   LUMBAR LAMINECTOMY/DECOMPRESSION MICRODISCECTOMY Right 10/06/2021   Procedure: Right Lumbar Four-Five Microdiscectomy, Right Lumbar Five-Sacal One Foraminotomy;  Surgeon: Coletta Memos, MD;  Location:  [facility];  Service: Neurosurgery;  Laterality: Right;  3C/RM 21   THORACIC DISCECTOMY  07/16/2012   Procedure: THORACIC DISCECTOMY;  Surgeon: Carmela Hurt, MD;  Location: [facility];  Service: Neurosurgery;  Laterality: Right;  RIGHT Thoracic seven-eight  thoracic diskectomy via thoracotomy by dr Laneta Simmers   THORACIC DISCECTOMY N/A 12/15/2014   Procedure: THORACIC SEVEN TO THORACIC NINE Laminectomy ;  Surgeon: Coletta Memos, MD;  Location: [facility];  Service: Neurosurgery;  Laterality: N/A;   THORACIC DISCECTOMY N/A 06/06/2017   Procedure: LAMINECTOMY THORACIC NINE-TEN;   Surgeon: Coletta Memos, MD;  Location: [facility];  Service: Neurosurgery;  Laterality: N/A;  LAMINECTOMY THORACIC NINE-TEN   THORACOTOMY  07/20/2011   Procedure: THORACOTOMY OPEN FOR SPINE SURGERY;  Surgeon: Norton Blizzard, MD;  Location: [facility];  Service: Vascular;  Laterality: N/A;   THORACOTOMY  07/16/2012   Procedure: THORACOTOMY OPEN FOR SPINE SURGERY;  Surgeon: Alleen Borne, MD;  Location: [facility];  Service: Thoracic;  Laterality: N/A;   Past Surgical History:  Procedure Laterality Date   BACK SURGERY  2010   CIRCUMCISION     LUMBAR LAMINECTOMY/DECOMPRESSION MICRODISCECTOMY  07/20/2011   Procedure: LUMBAR LAMINECTOMY/DECOMPRESSION MICRODISCECTOMY;  Surgeon: Carmela Hurt;  Location: [facility];  Service: Neurosurgery;  Laterality: N/A;  right thoracotomy with thoracic eight-nine discectomy and fusion   LUMBAR LAMINECTOMY/DECOMPRESSION MICRODISCECTOMY Right 10/06/2021   Procedure: Right Lumbar Four-Five Microdiscectomy, Right Lumbar Five-Sacal One Foraminotomy;  Surgeon: Coletta Memos, MD;  Location: [facility];  Service: Neurosurgery;  Laterality: Right;  3C/RM 21   THORACIC DISCECTOMY  07/16/2012   Procedure: THORACIC DISCECTOMY;  Surgeon: Carmela Hurt, MD;  Location: [facility];  Service: Neurosurgery;  Laterality: Right;  RIGHT Thoracic seven-eight  thoracic diskectomy via thoracotomy by dr Laneta Simmers   THORACIC DISCECTOMY N/A 12/15/2014   Procedure: THORACIC SEVEN TO THORACIC NINE Laminectomy ;  Surgeon: Coletta Memos, MD;  Location: [facility];  Service: Neurosurgery;  Laterality: N/A;   THORACIC DISCECTOMY N/A 06/06/2017   Procedure: LAMINECTOMY THORACIC NINE-TEN;  Surgeon: Coletta Memos, MD;  Location: [facility];  Service: Neurosurgery;  Laterality: N/A;  LAMINECTOMY THORACIC NINE-TEN   THORACOTOMY  07/20/2011   Procedure: THORACOTOMY OPEN FOR SPINE SURGERY;  Surgeon: Norton Blizzard, MD;  Location: [facility];  Service: Vascular;  Laterality: N/A;   THORACOTOMY  07/16/2012    Procedure: THORACOTOMY OPEN FOR SPINE SURGERY;  Surgeon: Alleen Borne, MD;  Location: [facility];  Service: Thoracic;  Laterality: N/A;   Past Medical History:  Diagnosis Date   Bowel trouble    urgency   Medical history non-contributory    Urinary urgency    BP 128/80   Pulse (!) 103   Ht 5\' 10"  (1.778 m)   Wt (!) 337 lb (152.9 kg)   SpO2 96%   BMI 48.35 kg/m   Opioid Risk Score:   Fall Risk Score:  `1  Depression screen PHQ 2/9     03/13/2023    1:10 PM 12/19/2022    2:41 PM 12/05/2022   10:16 AM 06/28/2021    9:29 AM 03/29/2021    9:39 AM 01/11/2021   10:19 AM  Depression screen PHQ 2/9  Decreased Interest 0 0 0 0 1 3  Down, Depressed, Hopeless 0 0 0 0 1 1  PHQ - 2 Score 0 0 0 0 2 4  Altered sleeping      3  Tired, decreased energy  3  Change in appetite      3  Feeling bad or failure about yourself       0  Trouble concentrating      2  Moving slowly or fidgety/restless      1  Suicidal thoughts      0  PHQ-9 Score      16    Review of Systems  Musculoskeletal:  Positive for back pain and gait problem.       Right hip pain Pain in both legs  All other systems reviewed and are negative.      Objective:   Physical Exam  General: No acute distress HEENT: NCAT, EOMI, oral membranes moist Cards: reg rate  Chest: normal effort Abdomen: Soft, NT, ND Skin: dry, intact Extremities: no edema Psych: pleasant and appropriate  Neuro: Cranial nerve exam intact.  Cognitively appropriate.  Patient with a mid thoracic sensory level approximately T8 and location.    Strength is actually 2-3/5 RLE 3-4/5 LLE Sensation decreased R>L LE. Marland Kitchen  Reflexes are trace.  standing balance improved but still has difficulties with full extension in standing. Gait much more stable with walker. Musculoskeletal: lpw back tender. Right hip + Patricks, pain with palpation.              Assessment & Plan:  1. Hx of thoracic myelopathy with residual sensory deficits from trunk  down.  He has a T8 sensory level on exam 2.  Lumbar spondylosis/ddd s/p decompression and fusion  3.  Right hip pain concerning for OA 4. See no sign of neurogenic bowel or bladder. Pt does have back pain which he pushes to empty his bowels on toilet. 5. Morbid obesity 6. Adhesive arachnoiditis.    Plan: 1.  Increase gabapentin to 600 mg qid.   2.  Increase nortriptyline to 25mg  qhs 2.  RFf diclofenac 75 mg twice daily with food.   3.  Referral for zynex Nexwave device for low back and buttock pain 4.  continue outpt therapy to address LE strengthn rom, pain, etc. Dicussed the importance of staying active. 5. Reviewed supplements for inflammation and nerve pain.  6. Bowels moving better with scheduled lax annd softnere. Less pain     20+ minutes of face to face patient care time were spent during this visit. All questions were encouraged and answered. Follow up with me in 3 mos.

## 2023-03-13 NOTE — Patient Instructions (Signed)
ALWAYS FEEL FREE TO CALL OUR OFFICE WITH ANY PROBLEMS OR QUESTIONS [phone]  **PLEASE NOTE** ALL MEDICATION REFILL REQUESTS (INCLUDING CONTROLLED SUBSTANCES) NEED TO BE MADE AT LEAST 7 DAYS PRIOR TO REFILL BEING DUE. ANY REFILL REQUESTS INSIDE THAT TIME FRAME MAY RESULT IN DELAYS IN RECEIVING YOUR PRESCRIPTION.                    SUPPLEMENTS USEFUL FOR INFLAMMATION: OMEGA 3 FATTY ACIDS, TURMERIC, GINGER, TART CHERRY EXTRACT, CELERY SEED, GLUCOSAMINE WITH CHONDROITIN

## 2023-03-14 ENCOUNTER — Ambulatory Visit: Payer: Medicare Other | Attending: Physical Medicine & Rehabilitation | Admitting: Physical Therapy

## 2023-03-14 ENCOUNTER — Encounter: Payer: Self-pay | Admitting: Physical Therapy

## 2023-03-14 DIAGNOSIS — M5416 Radiculopathy, lumbar region: Secondary | ICD-10-CM | POA: Diagnosis present

## 2023-03-14 DIAGNOSIS — M5459 Other low back pain: Secondary | ICD-10-CM | POA: Insufficient documentation

## 2023-03-14 DIAGNOSIS — M6281 Muscle weakness (generalized): Secondary | ICD-10-CM | POA: Insufficient documentation

## 2023-03-14 DIAGNOSIS — R296 Repeated falls: Secondary | ICD-10-CM | POA: Insufficient documentation

## 2023-03-14 DIAGNOSIS — R262 Difficulty in walking, not elsewhere classified: Secondary | ICD-10-CM | POA: Diagnosis present

## 2023-03-14 DIAGNOSIS — R29818 Other symptoms and signs involving the nervous system: Secondary | ICD-10-CM | POA: Insufficient documentation

## 2023-03-14 NOTE — Therapy (Signed)
OUTPATIENT PHYSICAL THERAPY TREATMENT    Patient Name: Travis Fitzpatrick MRN: [id number] DOB:1981/02/01, 42 y.o., male Today's Date: 03/14/23   END OF SESSION:  PT End of Session - 03/14/23 1831     Visit Number 14    Number of Visits 25    Date for PT Re-Evaluation 05/28/23    Authorization Type MEDICARE PART B reporting period from 12/26/2022    Progress Note Due on Visit 20    PT Start Time 1821    PT Stop Time 1900    PT Time Calculation (min) 39 min    Activity Tolerance Patient limited by fatigue;Patient limited by pain;Patient tolerated treatment well    Behavior During Therapy [facility] for tasks assessed/performed              Past Medical History:  Diagnosis Date   Bowel trouble    urgency   Medical history non-contributory    Urinary urgency    Past Surgical History:  Procedure Laterality Date   BACK SURGERY  2010   CIRCUMCISION     LUMBAR LAMINECTOMY/DECOMPRESSION MICRODISCECTOMY  07/20/2011   Procedure: LUMBAR LAMINECTOMY/DECOMPRESSION MICRODISCECTOMY;  Surgeon: Carmela Hurt;  Location: [facility];  Service: Neurosurgery;  Laterality: N/A;  right thoracotomy with thoracic eight-nine discectomy and fusion   LUMBAR LAMINECTOMY/DECOMPRESSION MICRODISCECTOMY Right 10/06/2021   Procedure: Right Lumbar Four-Five Microdiscectomy, Right Lumbar Five-Sacal One Foraminotomy;  Surgeon: Coletta Memos, MD;  Location: [facility];  Service: Neurosurgery;  Laterality: Right;  3C/RM 21   THORACIC DISCECTOMY  07/16/2012   Procedure: THORACIC DISCECTOMY;  Surgeon: Carmela Hurt, MD;  Location: [facility];  Service: Neurosurgery;  Laterality: Right;  RIGHT Thoracic seven-eight  thoracic diskectomy via thoracotomy by dr Laneta Simmers   THORACIC DISCECTOMY N/A 12/15/2014   Procedure: THORACIC SEVEN TO THORACIC NINE Laminectomy ;  Surgeon: Coletta Memos, MD;  Location: [facility];  Service: Neurosurgery;  Laterality: N/A;   THORACIC DISCECTOMY N/A 06/06/2017   Procedure: LAMINECTOMY THORACIC  NINE-TEN;  Surgeon: Coletta Memos, MD;  Location: [facility];  Service: Neurosurgery;  Laterality: N/A;  LAMINECTOMY THORACIC NINE-TEN   THORACOTOMY  07/20/2011   Procedure: THORACOTOMY OPEN FOR SPINE SURGERY;  Surgeon: Norton Blizzard, MD;  Location: [facility];  Service: Vascular;  Laterality: N/A;   THORACOTOMY  07/16/2012   Procedure: THORACOTOMY OPEN FOR SPINE SURGERY;  Surgeon: Alleen Borne, MD;  Location: [facility];  Service: Thoracic;  Laterality: N/A;   Patient Active Problem List   Diagnosis Date Noted   Nerve pain 12/05/2022   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   S/P lumbar spinal fusion 05/25/2022   Synovial cyst of lumbar facet joint 05/25/2022   HNP (herniated nucleus pulposus), lumbar 10/06/2021   Hand pain 06/28/2021   Lumbar facet arthropathy 01/11/2021   Thoracic spondylosis with myelopathy 01/11/2021   Thoracic spinal stenosis 06/06/2017   Stenosis, spinal, thoracic 12/15/2014   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 09/22/2014   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/2011    PCP: Nira Retort  REFERRING PROVIDER: Ranelle Oyster, MD   REFERRING DIAG: thoracic disc disease with myelopathy, nerve pain, adhesive arachnoiditis  Rationale for Evaluation and Treatment: Rehabilitation  THERAPY DIAG:  Other low back pain  Other symptoms and signs involving the nervous system  Radiculopathy, lumbar region  Repeated falls  Difficulty in walking, not elsewhere classified  ONSET DATE:  Suddenly started having weakness 11 years ago, most recent episode of worsening October 2022, s/p right  L4-5 laminotomy, microdiscectomy on 10/06/2021, now s/p L4-5 PLIF by Dr. Franky Macho on 05/25/2022.  PERTINENT HISTORY:  Patient is a 42 y.o. male who presents to outpatient physical therapy with a referral for medical diagnosis thoracic disc disease with myelopathy, nerve pain, adhesive arachnoiditis. This patient's chief complaints consist of  disabling low back and R >L leg pain and weakness with activity, bowel and bladder urgency, incontinence, and retention, leading to the following functional deficits: difficulty with or unable to complete any activity that requires weight bearing, use of B LE, and/or balance including working, household and community mobility, walking, driving, going to family gatherings, avoiding incontinent episodes, playing with daughter, helping around the house, bed mobility, transfers. 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 Franky Macho on 05/25/2022, adhesive arachnoiditis, nerve pain.  Patient denies hx of cancer, stroke, seizures, lung problems, heart problems, diabetes, unexplained weight loss, and osteoporosis.  SUBJECTIVE:                                                                                                                                                                                           SUBJECTIVE STATEMENT: Patient arrives with BRW. He states after his last regular PT session he had symptoms dizzy, lightheaded, light and sound sensitivity and nausea like he had when he went to the hospital last (without a fever). This lasted for 4-5 days and is better now. He missed at least one PT session due to his symptoms. He saw Dr. Riley Kill yesterday who felt the symptoms he had after last PT session were due to a flair up of his condition. He increased patients cymbalta, Nortriptyline, and added diclofenac. He wanted to increase gabapentin but thought it might make him feel "crazy" so patient  declined increase. He also ordered a zynex Nexwave TENS unit. He also had his wheelchair evaluation since last regular PT session. He states he was determined to need a Health visitor. He is excited about being able to use it to improve his participation with his family.   PAIN:  NPRS: 3/10 in right hip and groin region and low back.   PRECAUTIONS: Fall  PATIENT GOALS: "To get better, walk, drive again, climb stairs, play with my daughter, help around the house again"  NEXT MD VISIT: 03/13/2023 follow  up with Dr. Riley Kill.   OBJECTIVE  TODAY'S TREATMENT:    Therapeutic exercise: to centralize symptoms and improve ROM, strength, muscular endurance, and activity tolerance required for successful completion of functional activities.  - NuStep level 4 using bilateral lower extremities and upper extremities. Seat/handle setting 11/10; 8:50 minutes. Average SPM = 65.  - AMB overground BRW 1x120 feet, supervision - seated lumbar roll out with clear theraball, forwards and forwards diagonals per patient preference,1x3 min.  - standing balance in BRW with hands hovering over handles, with slight lateral sway side to side, 1x30 seconds + 1x35min. Seated rest. 1x10 min. (Increasing R hip pain with weight bearing).  - seated (propped position) long arc quad with 10# AW, 3x10 each side, propped sitting (propped position) marching, 2x10 each side. Seat 22.5 inches high. (Did not complete planned 3rd set due to pain starting to be intolerable).    Manual therapy: to reduce pain and tissue tension, improve range of motion, neuromodulation, in order to promote improved ability to complete functional activities. HOOKLYING - LAD to RLE (relieves hip pain) 3x45sec with belt   Pt required multimodal cuing for proper technique and to facilitate improved neuromuscular control, strength, range of motion, and functional ability resulting in improved performance and form.  PATIENT EDUCATION:  Education details:  Education on diagnosis, prognosis, POC, anatomy and physiology of current condition. Article handout.  Person educated: patient Education method: Explanation Education comprehension: verbalized understanding and needs further education  HOME EXERCISE PROGRAM: Verbally:  - seated lumbar flexion stretch with head down and one LE extended using rollator, 1x10 each side.  - curl up with legs elevated, 5x15 seconds - sidelying open book 1x10 each side   ASSESSMENT:  CLINICAL IMPRESSION: Patient arrived with pain fairly well controlled at start of session. He continued working on functional strength and balance to improve his functional mobility. Patient continues to be limited by B LE and back pain, R hip and LE more than left. Today's exercise was discontinued once his pain became too elevated and manual therapy for the right hip was utilized to help relive symptoms. Patient continues to struggle with basic mobility due to pain and neurogenic weakness. Patient would benefit from continued management of limiting condition by skilled physical therapist to address remaining impairments and functional limitations to work towards stated goals and return to PLOF or maximal functional independence.  OBJECTIVE IMPAIRMENTS: Abnormal gait, decreased activity tolerance, decreased balance, decreased coordination, decreased endurance, decreased knowledge of condition, decreased knowledge of use of DME, decreased mobility, difficulty walking, decreased ROM, decreased strength, increased edema, impaired perceived functional ability, increased muscle spasms, impaired flexibility, impaired sensation, impaired tone, improper body mechanics, postural dysfunction, obesity, and pain.   ACTIVITY LIMITATIONS: carrying, lifting, bending, standing, squatting, stairs, transfers, bed mobility, continence, bathing, toileting, dressing, hygiene/grooming, locomotion level, and caring for others  PARTICIPATION LIMITATIONS: meal  prep, cleaning, laundry, interpersonal relationship, driving, shopping, community activity, occupation, yard work, and   difficulty with or unable to complete any activity that requires weight bearing, use of B LE, and/or balance including working, household and community mobility, walking, driving, going to family gatherings, avoiding incontinent episodes, playing with daughter, helping around the house, bed mobility, transfers  PERSONAL FACTORS: Past/current experiences, Time since onset of injury/illness/exacerbation, and 3+ comorbidities:   3 thoracic spine surgeries, thoracic MRI notes "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 Franky Macho on 05/25/2022, adhesive arachnoiditis, nerve pain are also affecting patient's functional outcome.   REHAB POTENTIAL: Fair due to severity and nature of condition.   CLINICAL DECISION MAKING: Evolving/moderate complexity  EVALUATION COMPLEXITY: Moderate   GOALS: Goals reviewed with patient? No  SHORT TERM GOALS: Target date: 01/09/2023. Target date updated to 05/28/2023 for all unmet goals on 03/05/2023.   Patient will be independent with initial home exercise program for self-management of symptoms. Baseline: Initial HEP to be provided at visit 2 as appropriate (12/26/22); Goal status: MET   LONG TERM GOALS: Target date: 03/20/2023  Patient will be independent with a long-term home exercise program for self-management of symptoms.  Baseline: Initial HEP to be provided at visit 2 as appropriate (12/26/22); participating as able (01/31/2023); patient currently participating as tolerated (03/05/2023);   Goal status: In-progress  2.  Patient will  demonstrate improved FOTO by equal or greater than 10 points by visit #13 to demonstrate improvement in overall condition and self-reported functional ability.  Baseline: to be tested visit 2 as appropriate (12/26/22); 30 at visit #3 (01/02/2023); 46 at visit #13 (03/05/2023);  Goal status: MET  3.  Patient will demonstrate the ability to ambulate equal or greater than 600 feet with LRAD during the 6 minute walk to improve his household and community mobility.  Baseline: 34 feet with bari-RW (12/26/22); 200 feet with bari-RW (01/31/2023); 500 feet with BRW (03/05/2023);  Goal status: In-progress  4.  Patient will complete 5 Time Sit To Stand Test from 19.5 inch surface or lower in equal or less than 15 seconds with no UE support to demonstrate improved transfer ability for improved household and toileting mobility.  Baseline: 23 seconds with heavy B UE support on RW from 19.5 inch plinth. Pain throbbing down the right LE.  (12/26/22); 19 seconds with heavy B UE support on RW from 19.5 inch plinth (03/05/2023);  Goal status: In-progress  5.  Patient will report worst pain equal or less than 3/10 with functional activities to improve his ability to complete basic household and community mobility. Baseline: up to 8/10 (12/26/22); reports 3/10 pain (01/31/2023); reports 4/10 pain (02/19/2023); up to 7/10 in the last 2 week s(03/05/2023):  Goal status: In-progress   PLAN:  PT FREQUENCY: 1-2x/week  PT DURATION: 12 weeks  PLANNED INTERVENTIONS: Therapeutic exercises, Therapeutic activity, Neuromuscular re-education, Balance training, Gait training, Patient/Family education, Self Care, Joint mobilization, Stair training, Orthotic/Fit training, DME instructions, Aquatic Therapy, Dry Needling, Electrical stimulation, Wheelchair mobility training, Spinal mobilization, Cryotherapy, Moist heat, Manual therapy, and Re-evaluation.  PLAN FOR NEXT SESSION:  update HEP as appropriate, neurodynamics, gait training,  LE/core/functional strengthening, stretching, and balance as tolerated, education, manual therapy as needed.   Cira Rue, PT, DPT 03/14/2023, 7:31 PM    [facility] [address] [place], [place] [address] P: [phone] I F: [phone]

## 2023-03-14 NOTE — Addendum Note (Signed)
Addended by: Maryruth Eve A on: 03/14/2023 05:27 PM   Modules accepted: Orders

## 2023-03-19 ENCOUNTER — Ambulatory Visit: Payer: Medicare Other | Admitting: Physical Therapy

## 2023-03-19 ENCOUNTER — Encounter: Payer: Self-pay | Admitting: Physical Therapy

## 2023-03-19 DIAGNOSIS — M6281 Muscle weakness (generalized): Secondary | ICD-10-CM

## 2023-03-19 DIAGNOSIS — M5459 Other low back pain: Secondary | ICD-10-CM

## 2023-03-19 DIAGNOSIS — R29818 Other symptoms and signs involving the nervous system: Secondary | ICD-10-CM

## 2023-03-19 DIAGNOSIS — R262 Difficulty in walking, not elsewhere classified: Secondary | ICD-10-CM

## 2023-03-19 DIAGNOSIS — M5416 Radiculopathy, lumbar region: Secondary | ICD-10-CM

## 2023-03-19 DIAGNOSIS — R296 Repeated falls: Secondary | ICD-10-CM

## 2023-03-19 NOTE — Therapy (Signed)
OUTPATIENT PHYSICAL THERAPY TREATMENT    Patient Name: Travis Fitzpatrick MRN: [id number] DOB:11/10/1980, 42 y.o., male Today's Date: 03/19/23   END OF SESSION:  PT End of Session - 03/19/23 1924     Visit Number 15    Number of Visits 25    Date for PT Re-Evaluation 05/28/23    Authorization Type MEDICARE PART B reporting period from 12/26/2022    Progress Note Due on Visit 20    PT Start Time 1820    PT Stop Time 1905    PT Time Calculation (min) 45 min    Activity Tolerance Patient limited by fatigue;Patient limited by pain    Behavior During Therapy [facility] for tasks assessed/performed               Past Medical History:  Diagnosis Date   Bowel trouble    urgency   Medical history non-contributory    Urinary urgency    Past Surgical History:  Procedure Laterality Date   BACK SURGERY  2010   CIRCUMCISION     LUMBAR LAMINECTOMY/DECOMPRESSION MICRODISCECTOMY  07/20/2011   Procedure: LUMBAR LAMINECTOMY/DECOMPRESSION MICRODISCECTOMY;  Surgeon: Carmela Hurt;  Location: [facility];  Service: Neurosurgery;  Laterality: N/A;  right thoracotomy with thoracic eight-nine discectomy and fusion   LUMBAR LAMINECTOMY/DECOMPRESSION MICRODISCECTOMY Right 10/06/2021   Procedure: Right Lumbar Four-Five Microdiscectomy, Right Lumbar Five-Sacal One Foraminotomy;  Surgeon: Coletta Memos, MD;  Location: [facility];  Service: Neurosurgery;  Laterality: Right;  3C/RM 21   THORACIC DISCECTOMY  07/16/2012   Procedure: THORACIC DISCECTOMY;  Surgeon: Carmela Hurt, MD;  Location: [facility];  Service: Neurosurgery;  Laterality: Right;  RIGHT Thoracic seven-eight  thoracic diskectomy via thoracotomy by dr Laneta Simmers   THORACIC DISCECTOMY N/A 12/15/2014   Procedure: THORACIC SEVEN TO THORACIC NINE Laminectomy ;  Surgeon: Coletta Memos, MD;  Location: [facility];  Service: Neurosurgery;  Laterality: N/A;   THORACIC DISCECTOMY N/A 06/06/2017   Procedure: LAMINECTOMY THORACIC NINE-TEN;  Surgeon: Coletta Memos,  MD;  Location: [facility];  Service: Neurosurgery;  Laterality: N/A;  LAMINECTOMY THORACIC NINE-TEN   THORACOTOMY  07/20/2011   Procedure: THORACOTOMY OPEN FOR SPINE SURGERY;  Surgeon: Norton Blizzard, MD;  Location: [facility];  Service: Vascular;  Laterality: N/A;   THORACOTOMY  07/16/2012   Procedure: THORACOTOMY OPEN FOR SPINE SURGERY;  Surgeon: Alleen Borne, MD;  Location: [facility];  Service: Thoracic;  Laterality: N/A;   Patient Active Problem List   Diagnosis Date Noted   Nerve pain 12/05/2022   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   S/P lumbar spinal fusion 05/25/2022   Synovial cyst of lumbar facet joint 05/25/2022   HNP (herniated nucleus pulposus), lumbar 10/06/2021   Hand pain 06/28/2021   Lumbar facet arthropathy 01/11/2021   Thoracic spondylosis with myelopathy 01/11/2021   Thoracic spinal stenosis 06/06/2017   Stenosis, spinal, thoracic 12/15/2014   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 09/22/2014   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/2011    PCP: Nira Retort  REFERRING PROVIDER: Ranelle Oyster, MD   REFERRING DIAG: thoracic disc disease with myelopathy, nerve pain, adhesive arachnoiditis  Rationale for Evaluation and Treatment: Rehabilitation  THERAPY DIAG:  Other low back pain  Other symptoms and signs involving the nervous system  Radiculopathy, lumbar region  Muscle weakness (generalized)  Repeated falls  Difficulty in walking, not elsewhere classified  ONSET DATE:  Suddenly started having weakness 11 years ago, most recent episode of worsening October 2022,  s/p right L4-5 laminotomy, microdiscectomy on 10/06/2021, now s/p L4-5 PLIF by Dr. Franky Macho on 05/25/2022.  PERTINENT HISTORY:  Patient is a 42 y.o. male who presents to outpatient physical therapy with a referral for medical diagnosis thoracic disc disease with myelopathy, nerve pain, adhesive arachnoiditis. This patient's chief complaints consist of  disabling low back and R >L leg pain and weakness with activity, bowel and bladder urgency, incontinence, and retention, leading to the following functional deficits: difficulty with or unable to complete any activity that requires weight bearing, use of B LE, and/or balance including working, household and community mobility, walking, driving, going to family gatherings, avoiding incontinent episodes, playing with daughter, helping around the house, bed mobility, transfers. 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 Franky Macho on 05/25/2022, adhesive arachnoiditis, nerve pain.  Patient denies hx of cancer, stroke, seizures, lung problems, heart problems, diabetes, unexplained weight loss, and osteoporosis.  SUBJECTIVE:                                                                                                                                                                                           SUBJECTIVE STATEMENT: Patient arrives with BRW. He states he felt okay after last PT session. He states he was pretty sore the day after last PT session and Saturday. He is feeling better today.   PAIN:  NPRS: 3/10 in right hip and groin region and low back. Left leg trying to "act up today too" trying to "give out" at times.   PRECAUTIONS: Fall  PATIENT GOALS: "To get better, walk, drive again, climb stairs, play with my daughter, help around the house again"  NEXT MD VISIT: 03/13/2023 follow up with Dr. Riley Kill.   OBJECTIVE  TODAY'S TREATMENT:   Therapeutic exercise: to centralize  symptoms and improve ROM, strength, muscular endurance, and activity tolerance required for successful completion of functional activities.  - NuStep level 4 using bilateral lower extremities and upper extremities. Seat/handle setting 11/10; 6:58 minutes. Average SPM = 56.  - AMB overground BRW 1x120 feet, 2x50 feet, supervision - seated lumbar roll out with clear theraball, forwards and forwards diagonals per patient preference,1x3 min.  - seated B knee extension at [facility] machine, seat position 5, attempted at 25# too painful to do more than 1 rep,  1x5 at 15#. Unable to continue due to increasing pain. Cuing for improved  breathing with exhale during knee extension made exercise more comfortable but was very difficult to not hold breath with knee extension.  - standing balance in BRW with hands hovering over handles, with lateral sway side to side, 4x30 seconds + 1x64min. (Increasing R hip/groin sharp/throbbing pain with weight bearing).  - seated (propped position) long arc quad with 15# AW, 2x10 each side,   - propped sitting (propped position) marching, 2x10 each side. Seat 22.5 inches high. (Did not complete planned 3rd set due to pain starting to be intolerable).    Manual therapy: to reduce pain and tissue tension, improve range of motion, neuromodulation, in order to promote improved ability to complete functional activities. HOOKLYING - LAD to RLE (relieves hip pain) 3x45sec with belt   Pt required multimodal cuing for proper technique and to facilitate improved neuromuscular control, strength, range of motion, and functional ability resulting in improved performance and form.  PATIENT EDUCATION:  Education details: Education on diagnosis, prognosis, POC, anatomy and physiology of current condition. Article handout.  Person educated: patient Education method: Explanation Education comprehension: verbalized understanding and needs further education  HOME EXERCISE PROGRAM: Verbally:  -  seated lumbar flexion stretch with head down and one LE extended using rollator, 1x10 each side.  - curl up with legs elevated, 5x15 seconds - sidelying open book 1x10 each side   ASSESSMENT:  CLINICAL IMPRESSION: Patient arrived with report of continued limitation due to pain and weakness. Patient continued working on LE and functional strengthening as tolerated but continues to be limited by pain and weakness. He had increased concordant pain with breath holding when trying to complete knee extension exercises, and had difficulty not holding his breath while completing those exercises due to the pain and effort required to extend his R > L knee. He continues to have some temporary pain relief with R hip LAD. Patient would benefit from continued management of limiting condition by skilled physical therapist to address remaining impairments and functional limitations to work towards stated goals and return to PLOF or maximal functional independence.   OBJECTIVE IMPAIRMENTS: Abnormal gait, decreased activity tolerance, decreased balance, decreased coordination, decreased endurance, decreased knowledge of condition, decreased knowledge of use of DME, decreased mobility, difficulty walking, decreased ROM, decreased strength, increased edema, impaired perceived functional ability, increased muscle spasms, impaired flexibility, impaired sensation, impaired tone, improper body mechanics, postural dysfunction, obesity, and pain.   ACTIVITY LIMITATIONS: carrying, lifting, bending, standing, squatting, stairs, transfers, bed mobility, continence, bathing, toileting, dressing, hygiene/grooming, locomotion level, and caring for others  PARTICIPATION LIMITATIONS: meal prep, cleaning, laundry, interpersonal relationship, driving, shopping, community activity, occupation, yard work, and   difficulty with or unable to complete any activity that requires weight bearing, use of B LE, and/or balance including working,  household and community mobility, walking, driving, going to family gatherings, avoiding incontinent episodes, playing with daughter, helping around the house, bed mobility, transfers  PERSONAL FACTORS: Past/current experiences, Time since onset of injury/illness/exacerbation, and 3+ comorbidities:   3 thoracic spine surgeries, thoracic MRI notes "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 Franky Macho on 05/25/2022, adhesive arachnoiditis, nerve pain are also affecting patient's functional outcome.  REHAB POTENTIAL: Fair due to severity and nature of condition.   CLINICAL DECISION MAKING: Evolving/moderate complexity  EVALUATION COMPLEXITY: Moderate   GOALS: Goals reviewed with patient? No  SHORT TERM GOALS: Target date: 01/09/2023. Target date updated to 05/28/2023 for all unmet goals on 03/05/2023.   Patient will be independent with initial home exercise program for self-management of symptoms. Baseline: Initial HEP to be provided at visit 2 as appropriate (12/26/22); Goal status: MET   LONG TERM GOALS: Target date: 03/20/2023  Patient will be independent with a long-term home exercise program for self-management of symptoms.  Baseline: Initial HEP to be provided at visit 2 as appropriate (12/26/22); participating as able (01/31/2023); patient currently participating as tolerated (03/05/2023);   Goal status: In-progress  2.  Patient will demonstrate improved FOTO by equal or greater than 10 points by visit #13 to demonstrate improvement in overall condition and self-reported functional ability.  Baseline: to be tested visit 2 as appropriate (12/26/22); 30 at visit #3 (01/02/2023);  46 at visit #13 (03/05/2023);  Goal status: MET  3.  Patient will demonstrate the ability to ambulate equal or greater than 600 feet with LRAD during the 6 minute walk to improve his household and community mobility.  Baseline: 34 feet with bari-RW (12/26/22); 200 feet with bari-RW (01/31/2023); 500 feet with BRW (03/05/2023);  Goal status: In-progress  4.  Patient will complete 5 Time Sit To Stand Test from 19.5 inch surface or lower in equal or less than 15 seconds with no UE support to demonstrate improved transfer ability for improved household and toileting mobility.  Baseline: 23 seconds with heavy B UE support on RW from 19.5 inch plinth. Pain throbbing down the right LE.  (12/26/22); 19 seconds with heavy B UE support on RW from 19.5 inch plinth (03/05/2023);  Goal status: In-progress  5.  Patient will report worst pain equal or less than 3/10 with functional activities to improve his ability to complete basic household and community mobility. Baseline: up to 8/10 (12/26/22); reports 3/10 pain (01/31/2023); reports 4/10 pain (02/19/2023); up to 7/10 in the last 2 week s(03/05/2023):  Goal status: In-progress   PLAN:  PT FREQUENCY: 1-2x/week  PT DURATION: 12 weeks  PLANNED INTERVENTIONS: Therapeutic exercises, Therapeutic activity, Neuromuscular re-education, Balance training, Gait training, Patient/Family education, Self Care, Joint mobilization, Stair training, Orthotic/Fit training, DME instructions, Aquatic Therapy, Dry Needling, Electrical stimulation, Wheelchair mobility training, Spinal mobilization, Cryotherapy, Moist heat, Manual therapy, and Re-evaluation.  PLAN FOR NEXT SESSION:  update HEP as appropriate, neurodynamics, gait training, LE/core/functional strengthening, stretching, and balance as tolerated, education, manual therapy as needed.   Cira Rue, PT, DPT 03/19/2023, 7:26 PM    [facility] [address] [place], [place]  [address] P: [phone] I F: [phone]

## 2023-03-21 ENCOUNTER — Encounter: Payer: Self-pay | Admitting: Physical Medicine & Rehabilitation

## 2023-03-21 ENCOUNTER — Ambulatory Visit: Payer: Medicare Other | Admitting: Physical Therapy

## 2023-03-21 DIAGNOSIS — M5459 Other low back pain: Secondary | ICD-10-CM

## 2023-03-21 DIAGNOSIS — R262 Difficulty in walking, not elsewhere classified: Secondary | ICD-10-CM

## 2023-03-21 DIAGNOSIS — M5416 Radiculopathy, lumbar region: Secondary | ICD-10-CM

## 2023-03-21 DIAGNOSIS — M6281 Muscle weakness (generalized): Secondary | ICD-10-CM

## 2023-03-21 DIAGNOSIS — R296 Repeated falls: Secondary | ICD-10-CM

## 2023-03-21 DIAGNOSIS — R29818 Other symptoms and signs involving the nervous system: Secondary | ICD-10-CM

## 2023-03-21 NOTE — Therapy (Signed)
OUTPATIENT PHYSICAL THERAPY TREATMENT    Patient Name: Travis Fitzpatrick MRN: [id number] DOB:06-Dec-1980, 42 y.o., male Today's Date: 03/21/23   END OF SESSION:  PT End of Session - 03/21/23 1807     Visit Number 16    Number of Visits 25    Date for PT Re-Evaluation 05/28/23    Authorization Type MEDICARE PART B reporting period from 12/26/2022    Progress Note Due on Visit 20    PT Start Time 1732    PT Stop Time 1815    PT Time Calculation (min) 43 min    Activity Tolerance Patient limited by fatigue;Patient limited by pain    Behavior During Therapy [facility] for tasks assessed/performed                Past Medical History:  Diagnosis Date   Bowel trouble    urgency   Medical history non-contributory    Urinary urgency    Past Surgical History:  Procedure Laterality Date   BACK SURGERY  2010   CIRCUMCISION     LUMBAR LAMINECTOMY/DECOMPRESSION MICRODISCECTOMY  07/20/2011   Procedure: LUMBAR LAMINECTOMY/DECOMPRESSION MICRODISCECTOMY;  Surgeon: Carmela Hurt;  Location: [facility];  Service: Neurosurgery;  Laterality: N/A;  right thoracotomy with thoracic eight-nine discectomy and fusion   LUMBAR LAMINECTOMY/DECOMPRESSION MICRODISCECTOMY Right 10/06/2021   Procedure: Right Lumbar Four-Five Microdiscectomy, Right Lumbar Five-Sacal One Foraminotomy;  Surgeon: Coletta Memos, MD;  Location: [facility];  Service: Neurosurgery;  Laterality: Right;  3C/RM 21   THORACIC DISCECTOMY  07/16/2012   Procedure: THORACIC DISCECTOMY;  Surgeon: Carmela Hurt, MD;  Location: [facility];  Service: Neurosurgery;  Laterality: Right;  RIGHT Thoracic seven-eight  thoracic diskectomy via thoracotomy by dr Laneta Simmers   THORACIC DISCECTOMY N/A 12/15/2014   Procedure: THORACIC SEVEN TO THORACIC NINE Laminectomy ;  Surgeon: Coletta Memos, MD;  Location: [facility];  Service: Neurosurgery;  Laterality: N/A;   THORACIC DISCECTOMY N/A 06/06/2017   Procedure: LAMINECTOMY THORACIC NINE-TEN;  Surgeon: Coletta Memos, MD;  Location: [facility];  Service: Neurosurgery;  Laterality: N/A;  LAMINECTOMY THORACIC NINE-TEN   THORACOTOMY  07/20/2011   Procedure: THORACOTOMY OPEN FOR SPINE SURGERY;  Surgeon: Norton Blizzard, MD;  Location: [facility];  Service: Vascular;  Laterality: N/A;   THORACOTOMY  07/16/2012   Procedure: THORACOTOMY OPEN FOR SPINE SURGERY;  Surgeon: Alleen Borne, MD;  Location: [facility];  Service: Thoracic;  Laterality: N/A;   Patient Active Problem List   Diagnosis Date Noted   Nerve pain 12/05/2022   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   S/P lumbar spinal fusion 05/25/2022   Synovial cyst of lumbar facet joint 05/25/2022   HNP (herniated nucleus pulposus), lumbar 10/06/2021   Hand pain 06/28/2021   Lumbar facet arthropathy 01/11/2021   Thoracic spondylosis with myelopathy 01/11/2021   Thoracic spinal stenosis 06/06/2017   Stenosis, spinal, thoracic 12/15/2014   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 09/22/2014   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/2011    PCP: Nira Retort  REFERRING PROVIDER: Ranelle Oyster, MD   REFERRING DIAG: thoracic disc disease with myelopathy, nerve pain, adhesive arachnoiditis  Rationale for Evaluation and Treatment: Rehabilitation  THERAPY DIAG:  Other low back pain  Other symptoms and signs involving the nervous system  Radiculopathy, lumbar region  Muscle weakness (generalized)  Repeated falls  Difficulty in walking, not elsewhere classified  ONSET DATE:  Suddenly started having weakness 11 years ago, most recent episode of worsening October  2022, s/p right L4-5 laminotomy, microdiscectomy on 10/06/2021, now s/p L4-5 PLIF by Dr. Franky Macho on 05/25/2022.  PERTINENT HISTORY:  Patient is a 42 y.o. male who presents to outpatient physical therapy with a referral for medical diagnosis thoracic disc disease with myelopathy, nerve pain, adhesive arachnoiditis. This patient's chief complaints consist  of disabling low back and R >L leg pain and weakness with activity, bowel and bladder urgency, incontinence, and retention, leading to the following functional deficits: difficulty with or unable to complete any activity that requires weight bearing, use of B LE, and/or balance including working, household and community mobility, walking, driving, going to family gatherings, avoiding incontinent episodes, playing with daughter, helping around the house, bed mobility, transfers. 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 Franky Macho on 05/25/2022, adhesive arachnoiditis, nerve pain.  Patient denies hx of cancer, stroke, seizures, lung problems, heart problems, diabetes, unexplained weight loss, and osteoporosis.  SUBJECTIVE:                                                                                                                                                                                           SUBJECTIVE STATEMENT: Patient arrives with BRW. He states he had relief from the stabbing R hip pain until this morning after LAD last PT session. He states he had increased nerve pain after last PT session and it is still there but has eased off some. He states he fell recently when in a rush in the bathroom while turning and his legs got suddenly weak, like they do periodically when he turns.   PAIN:  NPRS: 4/10 in right hip and groin region and low back. Nerve pain feels like a pulling, and R hip pain feels like a stabbing.   PRECAUTIONS: Fall  PATIENT GOALS: "To get better, walk, drive  again, climb stairs, play with my daughter, help around the house again"  NEXT MD VISIT: 03/13/2023 follow up with Dr. Riley Kill.   OBJECTIVE  TODAY'S TREATMENT:   Manual therapy: to reduce pain and tissue tension, improve range of motion, neuromodulation, in order to promote improved ability to complete functional activities. HOOKLYING - LAD to RLE (relieves hip pain) 6x45sec with belt (3 at start of session and 3 at end of session)  Therapeutic exercise: to centralize symptoms  and improve ROM, strength, muscular endurance, and activity tolerance required for successful completion of functional activities.  - AMB overground BRW 1x200 feet, supervision, heavy UE support, occasional stumbles. - seated (propped position) long arc quad with 15# AW, 3x10 each side - AMB overground BRW 1x100 feet, supervision, heavy UE support, occasional stumbles. - standing balance in BRW with hands hovering over handles, with lateral sway side to side, 2x30 seconds. - AMB overground BRW 2x50 feet, supervision, heavy UE support, occasional stumbles. - standing balance in BRW with hands hovering over handles, with lateral sway side to side progressing to heel lifts as gait prep, 2x30 seconds.  - seated marching in propped seated position from 22.5 inch plinth, 3x10 each side.  - sit <> stand from 24 inch plinth with B UE support on BRW, attempting to use B LE as much as possible, 1x10, 1x4 (discontinued due to nerve pain in back of calves and thighs).   Pt required multimodal cuing for proper technique and to facilitate improved neuromuscular control, strength, range of motion, and functional ability resulting in improved performance and form.  PATIENT EDUCATION:  Education details: Education on diagnosis, prognosis, POC, anatomy and physiology of current condition. Article handout.  Person educated: patient Education method: Explanation Education comprehension: verbalized understanding and needs further  education  HOME EXERCISE PROGRAM: Verbally:  - seated lumbar flexion stretch with head down and one LE extended using rollator, 1x10 each side.  - curl up with legs elevated, 5x15 seconds - sidelying open book 1x10 each side   ASSESSMENT:  CLINICAL IMPRESSION: Patient arrived with report of relief of R hip pain for a day and a half after LAD last session and differentiation from the "nerve" pain vs the R hip pain. Today's session started and ended with LAD to help with pain control at the left hip. Patient continued working on weight bearing and open chain exercises to improve his functional mobility but was limited by fatigue, weakness, and increased nerve pain down the backs of his legs. He continues to required heavy reliance on B UE for ambulation and experiences sudden loss of strength in his legs when making turns at times. Patient would benefit from continued management of limiting condition by skilled physical therapist to address remaining impairments and functional limitations to work towards stated goals and return to PLOF or maximal functional independence.    OBJECTIVE IMPAIRMENTS: Abnormal gait, decreased activity tolerance, decreased balance, decreased coordination, decreased endurance, decreased knowledge of condition, decreased knowledge of use of DME, decreased mobility, difficulty walking, decreased ROM, decreased strength, increased edema, impaired perceived functional ability, increased muscle spasms, impaired flexibility, impaired sensation, impaired tone, improper body mechanics, postural dysfunction, obesity, and pain.   ACTIVITY LIMITATIONS: carrying, lifting, bending, standing, squatting, stairs, transfers, bed mobility, continence, bathing, toileting, dressing, hygiene/grooming, locomotion level, and caring for others  PARTICIPATION LIMITATIONS: meal prep, cleaning, laundry, interpersonal relationship, driving, shopping, community activity, occupation, yard work, and    difficulty with or unable to complete any activity that requires weight bearing, use of B LE, and/or balance including working, household and community mobility, walking, driving, going to family gatherings, avoiding incontinent episodes, playing with daughter, helping around the house, bed mobility, transfers  PERSONAL FACTORS: Past/current experiences, Time since onset of injury/illness/exacerbation, and 3+ comorbidities:   3 thoracic spine surgeries, thoracic MRI notes "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 Franky Macho on 05/25/2022, adhesive arachnoiditis, nerve pain are also affecting patient's functional outcome.   REHAB POTENTIAL: Fair due to severity and nature of condition.   CLINICAL DECISION MAKING: Evolving/moderate complexity  EVALUATION COMPLEXITY: Moderate   GOALS: Goals reviewed with patient? No  SHORT TERM GOALS: Target date: 01/09/2023. Target date updated to 05/28/2023 for all unmet goals on 03/05/2023.   Patient will be independent with initial home exercise program for self-management of symptoms. Baseline: Initial HEP to be provided at visit 2 as appropriate (12/26/22); Goal status: MET   LONG TERM GOALS: Target date: 03/20/2023  Patient will be independent with a long-term home exercise program for self-management of symptoms.  Baseline: Initial HEP to be provided at visit 2 as appropriate (12/26/22); participating as able (01/31/2023); patient currently participating as tolerated (03/05/2023);   Goal status: In-progress  2.  Patient will demonstrate improved FOTO by equal or greater than 10 points by visit #13 to demonstrate improvement in overall  condition and self-reported functional ability.  Baseline: to be tested visit 2 as appropriate (12/26/22); 30 at visit #3 (01/02/2023); 46 at visit #13 (03/05/2023);  Goal status: MET  3.  Patient will demonstrate the ability to ambulate equal or greater than 600 feet with LRAD during the 6 minute walk to improve his household and community mobility.  Baseline: 34 feet with bari-RW (12/26/22); 200 feet with bari-RW (01/31/2023); 500 feet with BRW (03/05/2023);  Goal status: In-progress  4.  Patient will complete 5 Time Sit To Stand Test from 19.5 inch surface or lower in equal or less than 15 seconds with no UE support to demonstrate improved transfer ability for improved household and toileting mobility.  Baseline: 23 seconds with heavy B UE support on RW from 19.5 inch plinth. Pain throbbing down the right LE.  (12/26/22); 19 seconds with heavy B UE support on RW from 19.5 inch plinth (03/05/2023);  Goal status: In-progress  5.  Patient will report worst pain equal or less than 3/10 with functional activities to improve his ability to complete basic household and community mobility. Baseline: up to 8/10 (12/26/22); reports 3/10 pain (01/31/2023); reports 4/10 pain (02/19/2023); up to 7/10 in the last 2 week s(03/05/2023):  Goal status: In-progress   PLAN:  PT FREQUENCY: 1-2x/week  PT DURATION: 12 weeks  PLANNED INTERVENTIONS: Therapeutic exercises, Therapeutic activity, Neuromuscular re-education, Balance training, Gait training, Patient/Family education, Self Care, Joint mobilization, Stair training, Orthotic/Fit training, DME instructions, Aquatic Therapy, Dry Needling, Electrical stimulation, Wheelchair mobility training, Spinal mobilization, Cryotherapy, Moist heat, Manual therapy, and Re-evaluation.  PLAN FOR NEXT SESSION:  update HEP as appropriate, neurodynamics, gait training, LE/core/functional strengthening, stretching, and balance as tolerated, education, manual therapy as  needed.   Cira Rue, PT, DPT 03/21/2023, 6:26 PM    [facility] [address] [place], [place] [address] P: [phone] I F: [phone]

## 2023-03-26 ENCOUNTER — Encounter: Payer: Self-pay | Admitting: Physical Therapy

## 2023-03-26 ENCOUNTER — Ambulatory Visit: Payer: Medicare Other | Admitting: Physical Therapy

## 2023-03-26 DIAGNOSIS — R29818 Other symptoms and signs involving the nervous system: Secondary | ICD-10-CM

## 2023-03-26 DIAGNOSIS — M5416 Radiculopathy, lumbar region: Secondary | ICD-10-CM

## 2023-03-26 DIAGNOSIS — M5459 Other low back pain: Secondary | ICD-10-CM

## 2023-03-26 DIAGNOSIS — R262 Difficulty in walking, not elsewhere classified: Secondary | ICD-10-CM

## 2023-03-26 DIAGNOSIS — R296 Repeated falls: Secondary | ICD-10-CM

## 2023-03-26 DIAGNOSIS — M6281 Muscle weakness (generalized): Secondary | ICD-10-CM

## 2023-03-26 MED ORDER — DULOXETINE HCL 60 MG PO CPEP: 60.0000 mg | ORAL_CAPSULE | Freq: Every day | ORAL | 11 refills | Status: DC

## 2023-03-26 NOTE — Therapy (Signed)
OUTPATIENT PHYSICAL THERAPY TREATMENT    Patient Name: Travis Fitzpatrick MRN: [id number] DOB:June 02, 1981, 42 y.o., male Today's Date: 03/26/23   END OF SESSION:  PT End of Session - 03/26/23 1815     Visit Number 17    Number of Visits 25    Date for PT Re-Evaluation 05/28/23    Authorization Type MEDICARE PART B reporting period from 12/26/2022    Progress Note Due on Visit 20    PT Start Time 1815    PT Stop Time 1905    PT Time Calculation (min) 50 min    Activity Tolerance Patient limited by fatigue;Patient limited by pain    Behavior During Therapy [facility] for tasks assessed/performed                 Past Medical History:  Diagnosis Date   Bowel trouble    urgency   Medical history non-contributory    Urinary urgency    Past Surgical History:  Procedure Laterality Date   BACK SURGERY  2010   CIRCUMCISION     LUMBAR LAMINECTOMY/DECOMPRESSION MICRODISCECTOMY  07/20/2011   Procedure: LUMBAR LAMINECTOMY/DECOMPRESSION MICRODISCECTOMY;  Surgeon: Carmela Hurt;  Location: [facility];  Service: Neurosurgery;  Laterality: N/A;  right thoracotomy with thoracic eight-nine discectomy and fusion   LUMBAR LAMINECTOMY/DECOMPRESSION MICRODISCECTOMY Right 10/06/2021   Procedure: Right Lumbar Four-Five Microdiscectomy, Right Lumbar Five-Sacal One Foraminotomy;  Surgeon: Coletta Memos, MD;  Location: [facility];  Service: Neurosurgery;  Laterality: Right;  3C/RM 21   THORACIC DISCECTOMY  07/16/2012   Procedure: THORACIC DISCECTOMY;  Surgeon: Carmela Hurt, MD;  Location: [facility];  Service: Neurosurgery;  Laterality: Right;  RIGHT Thoracic seven-eight  thoracic diskectomy via thoracotomy by dr Laneta Simmers   THORACIC DISCECTOMY N/A 12/15/2014   Procedure: THORACIC SEVEN TO THORACIC NINE Laminectomy ;  Surgeon: Coletta Memos, MD;  Location: [facility];  Service: Neurosurgery;  Laterality: N/A;   THORACIC DISCECTOMY N/A 06/06/2017   Procedure: LAMINECTOMY THORACIC NINE-TEN;  Surgeon: Coletta Memos, MD;  Location: [facility];  Service: Neurosurgery;  Laterality: N/A;  LAMINECTOMY THORACIC NINE-TEN   THORACOTOMY  07/20/2011   Procedure: THORACOTOMY OPEN FOR SPINE SURGERY;  Surgeon: Norton Blizzard, MD;  Location: [facility];  Service: Vascular;  Laterality: N/A;   THORACOTOMY  07/16/2012   Procedure: THORACOTOMY OPEN FOR SPINE SURGERY;  Surgeon: Alleen Borne, MD;  Location: [facility];  Service: Thoracic;  Laterality: N/A;   Patient Active Problem List   Diagnosis Date Noted   Nerve pain 12/05/2022   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   S/P lumbar spinal fusion 05/25/2022   Synovial cyst of lumbar facet joint 05/25/2022   HNP (herniated nucleus pulposus), lumbar 10/06/2021   Hand pain 06/28/2021   Lumbar facet arthropathy 01/11/2021   Thoracic spondylosis with myelopathy 01/11/2021   Thoracic spinal stenosis 06/06/2017   Stenosis, spinal, thoracic 12/15/2014   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 09/22/2014   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/2011    PCP: Nira Retort  REFERRING PROVIDER: Ranelle Oyster, MD   REFERRING DIAG: thoracic disc disease with myelopathy, nerve pain, adhesive arachnoiditis  Rationale for Evaluation and Treatment: Rehabilitation  THERAPY DIAG:  Other low back pain  Other symptoms and signs involving the nervous system  Radiculopathy, lumbar region  Muscle weakness (generalized)  Repeated falls  Difficulty in walking, not elsewhere classified  ONSET DATE:  Suddenly started having weakness 11 years ago, most recent episode of worsening  October 2022, s/p right L4-5 laminotomy, microdiscectomy on 10/06/2021, now s/p L4-5 PLIF by Dr. Franky Macho on 05/25/2022.  PERTINENT HISTORY:  Patient is a 42 y.o. male who presents to outpatient physical therapy with a referral for medical diagnosis thoracic disc disease with myelopathy, nerve pain, adhesive arachnoiditis. This patient's chief complaints consist  of disabling low back and R >L leg pain and weakness with activity, bowel and bladder urgency, incontinence, and retention, leading to the following functional deficits: difficulty with or unable to complete any activity that requires weight bearing, use of B LE, and/or balance including working, household and community mobility, walking, driving, going to family gatherings, avoiding incontinent episodes, playing with daughter, helping around the house, bed mobility, transfers. 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 Franky Macho on 05/25/2022, adhesive arachnoiditis, nerve pain.  Patient denies hx of cancer, stroke, seizures, lung problems, heart problems, diabetes, unexplained weight loss, and osteoporosis.  SUBJECTIVE:                                                                                                                                                                                           SUBJECTIVE STATEMENT: Patient arrives with BRW. He states he feels achy in his low back > right hip. He states he had soreness for a day after last PT session, "so it was good." It did not feel like he couldn't move.   PAIN:  NPRS: 3/10 low back > R hip. Nerve pain feels like a pulling, and R hip pain feels like a stabbing.   PRECAUTIONS: Fall  PATIENT GOALS: "To get better, walk, drive again, climb stairs, play with my daughter, help around the house again"  NEXT MD VISIT: 03/13/2023 follow up with Dr. Riley Kill.   OBJECTIVE  TODAY'S TREATMENT:   Manual therapy: to reduce pain and  tissue tension, improve range of motion, neuromodulation, in order to promote improved ability to complete functional activities. HOOKLYING - LAD to RLE (relieves hip pain) 6x45sec with belt (3 at start of session and 3 at end of session)  Therapeutic exercise: to centralize symptoms and improve ROM, strength, muscular endurance, and activity tolerance required for successful completion of functional activities.  - AMB overground BRW 2x200 feet, supervision, heavy UE support, occasional stumbles. Reports increasing pain down both  legs with progressive weakness with recovery with sitting.  - seated (propped position) long arc quad with R/L 15/17.5# AW, 4x10 each side.  - standing balance in BRW with hands hovering over handles, with lateral sway side to side, 4x30-40 seconds.  Pt required multimodal cuing for proper technique and to facilitate improved neuromuscular control, strength, range of motion, and functional ability resulting in improved performance and form.  PATIENT EDUCATION:  Education details: Education on diagnosis, prognosis, POC, anatomy and physiology of current condition. Article handout.  Person educated: patient Education method: Explanation Education comprehension: verbalized understanding and needs further education  HOME EXERCISE PROGRAM: Verbally:  - seated lumbar flexion stretch with head down and one LE extended using rollator, 1x10 each side.  - curl up with legs elevated, 5x15 seconds - sidelying open book 1x10 each side   ASSESSMENT:  CLINICAL IMPRESSION: Patient arrived reporting overall decrease in R hip pain and that low back pain is bothering him more today. Today's session continued with focus on LE and functional strengthening as tolerated. Patient experiences increasing pain and neurogenic weakness with standing and upright activities that are relieved with sitting. He continues to get relief form stabbing hip pain from long axis distraction through that  joint. Plan to continue with strengthening and balance exercises as tolerated next session. Patient would benefit from continued management of limiting condition by skilled physical therapist to address remaining impairments and functional limitations to work towards stated goals and return to PLOF or maximal functional independence.   OBJECTIVE IMPAIRMENTS: Abnormal gait, decreased activity tolerance, decreased balance, decreased coordination, decreased endurance, decreased knowledge of condition, decreased knowledge of use of DME, decreased mobility, difficulty walking, decreased ROM, decreased strength, increased edema, impaired perceived functional ability, increased muscle spasms, impaired flexibility, impaired sensation, impaired tone, improper body mechanics, postural dysfunction, obesity, and pain.   ACTIVITY LIMITATIONS: carrying, lifting, bending, standing, squatting, stairs, transfers, bed mobility, continence, bathing, toileting, dressing, hygiene/grooming, locomotion level, and caring for others  PARTICIPATION LIMITATIONS: meal prep, cleaning, laundry, interpersonal relationship, driving, shopping, community activity, occupation, yard work, and   difficulty with or unable to complete any activity that requires weight bearing, use of B LE, and/or balance including working, household and community mobility, walking, driving, going to family gatherings, avoiding incontinent episodes, playing with daughter, helping around the house, bed mobility, transfers  PERSONAL FACTORS: Past/current experiences, Time since onset of injury/illness/exacerbation, and 3+ comorbidities:   3 thoracic spine surgeries, thoracic MRI notes "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 Franky Macho on 05/25/2022, adhesive arachnoiditis, nerve pain are also affecting patient's functional outcome.   REHAB POTENTIAL: Fair due to severity and nature of condition.   CLINICAL DECISION MAKING: Evolving/moderate complexity  EVALUATION COMPLEXITY: Moderate   GOALS: Goals reviewed with patient? No  SHORT TERM GOALS: Target date: 01/09/2023. Target date updated to 05/28/2023 for all unmet goals on 03/05/2023.   Patient will be independent with initial home exercise program for self-management of symptoms. Baseline: Initial HEP to be provided at visit 2 as appropriate (12/26/22); Goal status: MET   LONG TERM GOALS: Target date: 03/20/2023  Patient will be independent with a long-term home exercise program for self-management of symptoms.  Baseline: Initial HEP to be provided at  visit 2 as appropriate (12/26/22); participating as able (01/31/2023); patient currently participating as tolerated (03/05/2023);   Goal status: In-progress  2.  Patient will demonstrate improved FOTO by equal or greater than 10 points by visit #13 to demonstrate improvement in overall condition and self-reported functional ability.  Baseline: to be tested visit 2 as appropriate (12/26/22); 30 at visit #3 (01/02/2023); 46 at visit #13 (03/05/2023);  Goal status: MET  3.  Patient will demonstrate the ability to ambulate equal or greater than 600 feet with LRAD during the 6 minute walk to improve his household and community mobility.  Baseline: 34 feet with bari-RW (12/26/22); 200 feet with bari-RW (01/31/2023); 500 feet with BRW (03/05/2023);  Goal status: In-progress  4.  Patient will complete 5 Time Sit To Stand Test from 19.5 inch surface or lower in equal or less than 15 seconds with no UE support to demonstrate improved transfer ability for improved household and toileting mobility.  Baseline: 23 seconds with heavy B UE support on RW  from 19.5 inch plinth. Pain throbbing down the right LE.  (12/26/22); 19 seconds with heavy B UE support on RW from 19.5 inch plinth (03/05/2023);  Goal status: In-progress  5.  Patient will report worst pain equal or less than 3/10 with functional activities to improve his ability to complete basic household and community mobility. Baseline: up to 8/10 (12/26/22); reports 3/10 pain (01/31/2023); reports 4/10 pain (02/19/2023); up to 7/10 in the last 2 week s(03/05/2023):  Goal status: In-progress   PLAN:  PT FREQUENCY: 1-2x/week  PT DURATION: 12 weeks  PLANNED INTERVENTIONS: Therapeutic exercises, Therapeutic activity, Neuromuscular re-education, Balance training, Gait training, Patient/Family education, Self Care, Joint mobilization, Stair training, Orthotic/Fit training, DME instructions, Aquatic Therapy, Dry Needling, Electrical stimulation, Wheelchair mobility training, Spinal mobilization, Cryotherapy, Moist heat, Manual therapy, and Re-evaluation.  PLAN FOR NEXT SESSION:  update HEP as appropriate, neurodynamics, gait training, LE/core/functional strengthening, stretching, and balance as tolerated, education, manual therapy as needed.   Cira Rue, PT, DPT 03/26/2023, 7:19 PM    [facility] [address] [place], [place] [address] P: [phone] I F: [phone]

## 2023-03-26 NOTE — Telephone Encounter (Signed)
Talked with Travis Fitzpatrick. He might be having migraines vs BPPV?  They are self-limited and happening once a week at most. Asked him to check BP the next time he has one of these.

## 2023-03-28 ENCOUNTER — Ambulatory Visit: Payer: Medicare Other | Admitting: Physical Therapy

## 2023-04-02 ENCOUNTER — Encounter: Payer: Self-pay | Admitting: Physical Therapy

## 2023-04-02 ENCOUNTER — Ambulatory Visit: Payer: Medicare Other | Admitting: Physical Therapy

## 2023-04-02 DIAGNOSIS — R262 Difficulty in walking, not elsewhere classified: Secondary | ICD-10-CM

## 2023-04-02 DIAGNOSIS — M5416 Radiculopathy, lumbar region: Secondary | ICD-10-CM

## 2023-04-02 DIAGNOSIS — M5459 Other low back pain: Secondary | ICD-10-CM

## 2023-04-02 DIAGNOSIS — R296 Repeated falls: Secondary | ICD-10-CM

## 2023-04-02 DIAGNOSIS — M6281 Muscle weakness (generalized): Secondary | ICD-10-CM

## 2023-04-02 DIAGNOSIS — R29818 Other symptoms and signs involving the nervous system: Secondary | ICD-10-CM

## 2023-04-02 NOTE — Therapy (Signed)
OUTPATIENT PHYSICAL THERAPY TREATMENT    Patient Name: Travis Fitzpatrick MRN: [id number] DOB:[birth year], 42 y.o., male Today's Date: 04/02/23   END OF SESSION:  PT End of Session - 04/02/23 1849     Visit Number 18    Number of Visits 25    Date for PT Re-Evaluation 05/28/23    Authorization Type MEDICARE PART B reporting period from 12/26/2022    Progress Note Due on Visit 20    PT Start Time 1817    PT Stop Time 1857    PT Time Calculation (min) 40 min    Activity Tolerance Patient limited by fatigue;Patient limited by pain    Behavior During Therapy [facility] for tasks assessed/performed                  Past Medical History:  Diagnosis Date   Bowel trouble    urgency   Medical history non-contributory    Urinary urgency    Past Surgical History:  Procedure Laterality Date   BACK SURGERY  2010   CIRCUMCISION     LUMBAR LAMINECTOMY/DECOMPRESSION MICRODISCECTOMY  07/20/2011   Procedure: LUMBAR LAMINECTOMY/DECOMPRESSION MICRODISCECTOMY;  Surgeon: Carmela Hurt;  Location: [facility];  Service: Neurosurgery;  Laterality: N/A;  right thoracotomy with thoracic eight-nine discectomy and fusion   LUMBAR LAMINECTOMY/DECOMPRESSION MICRODISCECTOMY Right 10/06/2021   Procedure: Right Lumbar Four-Five Microdiscectomy, Right Lumbar Five-Sacal One Foraminotomy;  Surgeon: Coletta Memos, MD;  Location: [facility];  Service: Neurosurgery;  Laterality: Right;  3C/RM 21   THORACIC DISCECTOMY  07/16/2012   Procedure: THORACIC DISCECTOMY;  Surgeon: Carmela Hurt, MD;  Location: [facility];  Service: Neurosurgery;  Laterality: Right;  RIGHT Thoracic seven-eight  thoracic diskectomy via thoracotomy by dr Laneta Simmers   THORACIC DISCECTOMY N/A 12/15/2014   Procedure: THORACIC SEVEN TO THORACIC NINE Laminectomy ;  Surgeon: Coletta Memos, MD;  Location: [facility];  Service: Neurosurgery;  Laterality: N/A;   THORACIC DISCECTOMY N/A 06/06/2017   Procedure: LAMINECTOMY THORACIC NINE-TEN;  Surgeon: Coletta Memos, MD;  Location: [facility];  Service: Neurosurgery;  Laterality: N/A;  LAMINECTOMY THORACIC NINE-TEN   THORACOTOMY  07/20/2011   Procedure: THORACOTOMY OPEN FOR SPINE SURGERY;  Surgeon: Norton Blizzard, MD;  Location: [facility];  Service: Vascular;  Laterality: N/A;   THORACOTOMY  07/16/2012   Procedure: THORACOTOMY OPEN FOR SPINE SURGERY;  Surgeon: Alleen Borne, MD;  Location: [facility];  Service: Thoracic;  Laterality: N/A;   Patient Active Problem List   Diagnosis Date Noted   Nerve pain 12/05/2022   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   S/P lumbar spinal fusion 05/25/2022   Synovial cyst of lumbar facet joint 05/25/2022   HNP (herniated nucleus pulposus), lumbar 10/06/2021   Hand pain 06/28/2021   Lumbar facet arthropathy 01/11/2021   Thoracic spondylosis with myelopathy 01/11/2021   Thoracic spinal stenosis 06/06/2017   Stenosis, spinal, thoracic 12/15/2014   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 09/22/2014   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/2011    PCP: Nira Retort  REFERRING PROVIDER: Ranelle Oyster, MD   REFERRING DIAG: thoracic disc disease with myelopathy, nerve pain, adhesive arachnoiditis  Rationale for Evaluation and Treatment: Rehabilitation  THERAPY DIAG:  Other low back pain  Other symptoms and signs involving the nervous system  Radiculopathy, lumbar region  Muscle weakness (generalized)  Repeated falls  Difficulty in walking, not elsewhere classified  ONSET DATE:  Suddenly started having weakness 11 years ago, most recent episode of  worsening October 2022, s/p right L4-5 laminotomy, microdiscectomy on 10/06/2021, now s/p L4-5 PLIF by Dr. Franky Macho on 05/25/2022.  PERTINENT HISTORY:  Patient is a 42 y.o. male who presents to outpatient physical therapy with a referral for medical diagnosis thoracic disc disease with myelopathy, nerve pain, adhesive arachnoiditis. This patient's chief complaints consist  of disabling low back and R >L leg pain and weakness with activity, bowel and bladder urgency, incontinence, and retention, leading to the following functional deficits: difficulty with or unable to complete any activity that requires weight bearing, use of B LE, and/or balance including working, household and community mobility, walking, driving, going to family gatherings, avoiding incontinent episodes, playing with daughter, helping around the house, bed mobility, transfers. 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 Franky Macho on 05/25/2022, adhesive arachnoiditis, nerve pain.  Patient denies hx of cancer, stroke, seizures, lung problems, heart problems, diabetes, unexplained weight loss, and osteoporosis.  SUBJECTIVE:                                                                                                                                                                                           SUBJECTIVE STATEMENT: Patient arrives with BRW. He states he went home and took a shower after last PT session and went to bed and when he woke up he could not move because of the pain in his low back and right hip. When he tried to get out of the bed and put pressure on his right LE he could not and it felt like "a pinched nerve" or like he "would hit the floor." He states this continued for 3 days and then he felt better. He states the LAD through the R hip continues to help him and he has been able to take a couple of steps without UE support on the walker before his legs become to painful to  continue that, which is an improvement from before.   PAIN:  NPRS: 4-5/10 low back and R hip. Nerve pain 4/10 down both legs, R > L.   PRECAUTIONS: Fall  PATIENT GOALS: "To get better, walk, drive again, climb stairs, play with my daughter, help around the house again"  NEXT MD VISIT: 03/13/2023 follow up with Dr. Riley Kill.   OBJECTIVE  TODAY'S TREATMENT:  Manual therapy: to reduce pain and tissue tension, improve range of motion, neuromodulation, in order to promote improved ability to complete functional activities. HOOKLYING - LAD to RLE (relieves hip pain) 6x45sec with belt (3 at start of session and 3 at end of session)  Therapeutic exercise: to centralize symptoms and improve ROM, strength, muscular endurance, and activity tolerance required for successful completion of functional activities.  - AMB overground BRW 1x200 feet, supervision, heavy UE support, occasional stumbles. Reports increasing pain down both legs with progressive weakness with recovery with sitting.  - seated (propped position) long arc quad with R/L 15/17.5# AW, 3x10 each side. (R side partial ROM due to too much pain from nerve tension with full R knee extension).  - standing balance in BRW with hands hovering over handles, with lateral sway side to side, 2x30-40 seconds. - standing alternating toe taps on a2z pad with B UE support , 2x30 seconds - AMB overground BRW 1x200 feet, supervision, heavy UE support. Reports increasing pain down both legs with progressive weakness with recovery with sitting.  - seated marching in propped seated position from 22.5 inch plinth, 3x10 each side.   Pt required multimodal cuing for proper technique and to facilitate improved neuromuscular control, strength, range of motion, and functional ability resulting in improved performance and form.  PATIENT EDUCATION:  Education details: Education on diagnosis, prognosis, POC, anatomy and physiology of current condition. Article  handout.  Person educated: patient Education method: Explanation Education comprehension: verbalized understanding and needs further education  HOME EXERCISE PROGRAM: Verbally:  - seated lumbar flexion stretch with head down and one LE extended using rollator, 1x10 each side.  - curl up with legs elevated, 5x15 seconds - sidelying open book 1x10 each side   ASSESSMENT:  CLINICAL IMPRESSION: Patient arrives reporting severe pain/weakness for 3 days after last PT session. He was motivated to continue with mobility and LE strengthening as tolerated today. Continued with exercises that have been previously tolerated with LAD through R hip to improve hip pain. Patient continues to be limited by B leg pain, back pain, and weakness during session and get relief from R hip pain with LAD. Patient would benefit from continued management of limiting condition by skilled physical therapist to address remaining impairments and functional limitations to work towards stated goals and return to PLOF or maximal functional independence.    OBJECTIVE IMPAIRMENTS: Abnormal gait, decreased activity tolerance, decreased balance, decreased coordination, decreased endurance, decreased knowledge of condition, decreased knowledge of use of DME, decreased mobility, difficulty walking, decreased ROM, decreased strength, increased edema, impaired perceived functional ability, increased muscle spasms, impaired flexibility, impaired sensation, impaired tone, improper body mechanics, postural dysfunction, obesity, and pain.   ACTIVITY LIMITATIONS: carrying, lifting, bending, standing, squatting, stairs, transfers, bed mobility, continence, bathing, toileting, dressing, hygiene/grooming, locomotion level, and caring for others  PARTICIPATION LIMITATIONS: meal prep, cleaning, laundry, interpersonal relationship, driving, shopping, community activity, occupation, yard work, and   difficulty with or unable to complete any  activity that requires weight bearing, use of B LE, and/or balance including working, household and community mobility, walking, driving, going to family gatherings, avoiding incontinent episodes, playing with daughter, helping around the house, bed mobility, transfers  PERSONAL FACTORS: Past/current experiences, Time since onset of injury/illness/exacerbation, and 3+ comorbidities:   3 thoracic spine surgeries, thoracic MRI notes "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 Franky Macho on 05/25/2022, adhesive arachnoiditis, nerve pain are also affecting patient's functional outcome.   REHAB POTENTIAL: Fair due to severity and nature of condition.   CLINICAL DECISION MAKING: Evolving/moderate complexity  EVALUATION COMPLEXITY: Moderate   GOALS: Goals reviewed with patient? No  SHORT TERM GOALS: Target date: 01/09/2023. Target date updated to 05/28/2023 for all unmet goals on 03/05/2023.   Patient will be independent with initial home exercise program for self-management of symptoms. Baseline: Initial HEP to be provided at visit 2 as appropriate (12/26/22); Goal status: MET   LONG TERM GOALS: Target date: 03/20/2023  Patient will be independent with a long-term home exercise program for self-management of symptoms.  Baseline: Initial HEP to be provided at visit 2 as appropriate (12/26/22); participating as able (01/31/2023); patient currently participating as tolerated (03/05/2023);   Goal status: In-progress  2.  Patient will demonstrate improved FOTO by equal or greater than 10 points by visit #13 to demonstrate improvement in overall condition and self-reported functional ability.   Baseline: to be tested visit 2 as appropriate (12/26/22); 30 at visit #3 (01/02/2023); 46 at visit #13 (03/05/2023);  Goal status: MET  3.  Patient will demonstrate the ability to ambulate equal or greater than 600 feet with LRAD during the 6 minute walk to improve his household and community mobility.  Baseline: 34 feet with bari-RW (12/26/22); 200 feet with bari-RW (01/31/2023); 500 feet with BRW (03/05/2023);  Goal status: In-progress  4.  Patient will complete 5 Time Sit To Stand Test from 19.5 inch surface or lower in equal or less than 15 seconds with no UE support to demonstrate improved transfer ability for improved household and toileting mobility.  Baseline: 23 seconds with heavy B UE support on RW from 19.5 inch plinth. Pain throbbing down the right LE.  (12/26/22); 19 seconds with heavy B UE support on RW from 19.5 inch plinth (03/05/2023);  Goal status: In-progress  5.  Patient will report worst pain equal or less than 3/10 with functional activities to improve his ability to complete basic household and community mobility. Baseline: up to 8/10 (12/26/22); reports 3/10 pain (01/31/2023); reports 4/10 pain (02/19/2023); up to 7/10 in the last 2 week s(03/05/2023):  Goal status: In-progress   PLAN:  PT FREQUENCY: 1-2x/week  PT DURATION: 12 weeks  PLANNED INTERVENTIONS: Therapeutic exercises, Therapeutic activity, Neuromuscular re-education, Balance training, Gait training, Patient/Family education, Self Care, Joint mobilization, Stair training, Orthotic/Fit training, DME instructions, Aquatic Therapy, Dry Needling, Electrical stimulation, Wheelchair mobility training, Spinal mobilization, Cryotherapy, Moist heat, Manual therapy, and Re-evaluation.  PLAN FOR NEXT SESSION:  update HEP as appropriate, neurodynamics, gait training, LE/core/functional strengthening, stretching, and balance as tolerated, education, manual therapy as needed.   Cira Rue, PT, DPT 04/02/2023, 6:57 PM     [facility] [address] [place], [place] [address] P: [phone] I F: [phone]

## 2023-04-04 ENCOUNTER — Ambulatory Visit: Payer: Medicare Other | Admitting: Physical Therapy

## 2023-04-04 ENCOUNTER — Encounter: Payer: Self-pay | Admitting: Physical Therapy

## 2023-04-04 DIAGNOSIS — M5459 Other low back pain: Secondary | ICD-10-CM | POA: Diagnosis not present

## 2023-04-04 DIAGNOSIS — M5416 Radiculopathy, lumbar region: Secondary | ICD-10-CM

## 2023-04-04 DIAGNOSIS — M6281 Muscle weakness (generalized): Secondary | ICD-10-CM

## 2023-04-04 DIAGNOSIS — R262 Difficulty in walking, not elsewhere classified: Secondary | ICD-10-CM

## 2023-04-04 DIAGNOSIS — R296 Repeated falls: Secondary | ICD-10-CM

## 2023-04-04 DIAGNOSIS — R29818 Other symptoms and signs involving the nervous system: Secondary | ICD-10-CM

## 2023-04-04 NOTE — Therapy (Signed)
OUTPATIENT PHYSICAL THERAPY TREATMENT    Patient Name: Travis Fitzpatrick MRN: [id number] DOB:1981-04-16, 42 y.o., male Today's Date: 04/04/23   END OF SESSION:  PT End of Session - 04/04/23 1846     Visit Number 19    Number of Visits 25    Date for PT Re-Evaluation 05/28/23    Authorization Type MEDICARE PART B reporting period from 12/26/2022    Progress Note Due on Visit 20    PT Start Time 1817    PT Stop Time 1857    PT Time Calculation (min) 40 min    Activity Tolerance Patient limited by fatigue;Patient limited by pain    Behavior During Therapy [facility] for tasks assessed/performed                   Past Medical History:  Diagnosis Date   Bowel trouble    urgency   Medical history non-contributory    Urinary urgency    Past Surgical History:  Procedure Laterality Date   BACK SURGERY  2010   CIRCUMCISION     LUMBAR LAMINECTOMY/DECOMPRESSION MICRODISCECTOMY  07/20/2011   Procedure: LUMBAR LAMINECTOMY/DECOMPRESSION MICRODISCECTOMY;  Surgeon: Carmela Hurt;  Location: [facility];  Service: Neurosurgery;  Laterality: N/A;  right thoracotomy with thoracic eight-nine discectomy and fusion   LUMBAR LAMINECTOMY/DECOMPRESSION MICRODISCECTOMY Right 10/06/2021   Procedure: Right Lumbar Four-Five Microdiscectomy, Right Lumbar Five-Sacal One Foraminotomy;  Surgeon: Coletta Memos, MD;  Location: [facility];  Service: Neurosurgery;  Laterality: Right;  3C/RM 21   THORACIC DISCECTOMY  07/16/2012   Procedure: THORACIC DISCECTOMY;  Surgeon: Carmela Hurt, MD;  Location: [facility];  Service: Neurosurgery;  Laterality: Right;  RIGHT Thoracic seven-eight  thoracic diskectomy via thoracotomy by dr Laneta Simmers   THORACIC DISCECTOMY N/A 12/15/2014   Procedure: THORACIC SEVEN TO THORACIC NINE Laminectomy ;  Surgeon: Coletta Memos, MD;  Location: [facility];  Service: Neurosurgery;  Laterality: N/A;   THORACIC DISCECTOMY N/A 06/06/2017   Procedure: LAMINECTOMY THORACIC NINE-TEN;  Surgeon:  Coletta Memos, MD;  Location: [facility];  Service: Neurosurgery;  Laterality: N/A;  LAMINECTOMY THORACIC NINE-TEN   THORACOTOMY  07/20/2011   Procedure: THORACOTOMY OPEN FOR SPINE SURGERY;  Surgeon: Norton Blizzard, MD;  Location: [facility];  Service: Vascular;  Laterality: N/A;   THORACOTOMY  07/16/2012   Procedure: THORACOTOMY OPEN FOR SPINE SURGERY;  Surgeon: Alleen Borne, MD;  Location: [facility];  Service: Thoracic;  Laterality: N/A;   Patient Active Problem List   Diagnosis Date Noted   Nerve pain 12/05/2022   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   S/P lumbar spinal fusion 05/25/2022   Synovial cyst of lumbar facet joint 05/25/2022   HNP (herniated nucleus pulposus), lumbar 10/06/2021   Hand pain 06/28/2021   Lumbar facet arthropathy 01/11/2021   Thoracic spondylosis with myelopathy 01/11/2021   Thoracic spinal stenosis 06/06/2017   Stenosis, spinal, thoracic 12/15/2014   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 09/22/2014   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/2011    PCP: Nira Retort  REFERRING PROVIDER: Ranelle Oyster, MD   REFERRING DIAG: thoracic disc disease with myelopathy, nerve pain, adhesive arachnoiditis  Rationale for Evaluation and Treatment: Rehabilitation  THERAPY DIAG:  Other low back pain  Other symptoms and signs involving the nervous system  Radiculopathy, lumbar region  Muscle weakness (generalized)  Repeated falls  Difficulty in walking, not elsewhere classified  ONSET DATE:  Suddenly started having weakness 11 years ago, most recent episode  of worsening October 2022, s/p right L4-5 laminotomy, microdiscectomy on 10/06/2021, now s/p L4-5 PLIF by Dr. Franky Macho on 05/25/2022.  PERTINENT HISTORY:  Patient is a 42 y.o. male who presents to outpatient physical therapy with a referral for medical diagnosis thoracic disc disease with myelopathy, nerve pain, adhesive arachnoiditis. This patient's chief complaints  consist of disabling low back and R >L leg pain and weakness with activity, bowel and bladder urgency, incontinence, and retention, leading to the following functional deficits: difficulty with or unable to complete any activity that requires weight bearing, use of B LE, and/or balance including working, household and community mobility, walking, driving, going to family gatherings, avoiding incontinent episodes, playing with daughter, helping around the house, bed mobility, transfers. 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 Franky Macho on 05/25/2022, adhesive arachnoiditis, nerve pain.  Patient denies hx of cancer, stroke, seizures, lung problems, heart problems, diabetes, unexplained weight loss, and osteoporosis.  SUBJECTIVE:                                                                                                                                                                                           SUBJECTIVE STATEMENT: Patient arrives with BRW. He states he tried something different after last PT session. Instead of going to bed after taking a shower, he sat up for a while after taking a shower and that seemed to help him. He had increased in and soreness from baseline but not as bad as last week. He states he can feel his strength getting better.   PAIN:  NPRS: 4/10 low back. 2/10 in R hip and nerve pain 2/10 down both legs, R > L.   PRECAUTIONS: Fall  PATIENT GOALS: "To get better, walk, drive again, climb stairs, play with my daughter, help around the house again"  NEXT  MD VISIT: 03/13/2023 follow up with Dr. Riley Kill.   OBJECTIVE  TODAY'S TREATMENT:   Manual therapy: to reduce pain and tissue tension, improve range of motion, neuromodulation, in order to promote improved ability to complete functional activities. HOOKLYING - LAD to RLE (relieves hip pain) 6x45sec with belt (3 at start of session and 3 at end of session)  Therapeutic exercise: to centralize symptoms and improve ROM, strength, muscular endurance, and activity tolerance required for successful  completion of functional activities.  - AMB overground BRW 1x200 feet, supervision, heavy UE support. Reports increasing pain down both legs with progressive weakness with recovery with sitting.  - seated (propped position) long arc quad with R/L 15/17.5# AW, 3x10 each side. (R side partial ROM due to too much pain from nerve tension with full R knee extension).  - standing balance in BRW with hands hovering over handles or with light weight through handles, with lateral sway side to side to trying to tap toes on a2z pad. 3x60 seconds. SBA on large plinth behind patient  - seated marching in propped seated position from 22.5 inch plinth, 3x10 each side.  - seated lumbar flexion ball roll out, 1x3 min - AMB overground BRW 1x200 feet, supervision, heavy UE support. Reports increasing pain down both legs with progressive weakness with partial recovery with sitting.   Pt required multimodal cuing for proper technique and to facilitate improved neuromuscular control, strength, range of motion, and functional ability resulting in improved performance and form.  PATIENT EDUCATION:  Education details: Education on diagnosis, prognosis, POC, anatomy and physiology of current condition. Article handout.  Person educated: patient Education method: Explanation Education comprehension: verbalized understanding and needs further education  HOME EXERCISE PROGRAM: Verbally:  - seated lumbar flexion stretch with head  down and one LE extended using rollator, 1x10 each side.  - curl up with legs elevated, 5x15 seconds - sidelying open book 1x10 each side  ASSESSMENT:  CLINICAL IMPRESSION: Patient arrives increased soreness/pain after last PT session but able to make it to PT again today with determination. He continued with similar exercises as last session that were not progressed due to increased pain following. He continues to be limited by increased back and nerve pain down both legs with most exercises, and mildly relieved with seated lumbar flexion that was added to today's session to help with pain relief and so he could tolerate strengthening and functional exercises. Patient continues to be severely limited by pain and neuropathy weakness that worsens with standing. His right hip pain has been better controlled lately with LAD performed regularly before and after exercises at each PT appointment. Patient would benefit from continued management of limiting condition by skilled physical therapist to address remaining impairments and functional limitations to work towards stated goals and return to PLOF or maximal functional independence.     OBJECTIVE IMPAIRMENTS: Abnormal gait, decreased activity tolerance, decreased balance, decreased coordination, decreased endurance, decreased knowledge of condition, decreased knowledge of use of DME, decreased mobility, difficulty walking, decreased ROM, decreased strength, increased edema, impaired perceived functional ability, increased muscle spasms, impaired flexibility, impaired sensation, impaired tone, improper body mechanics, postural dysfunction, obesity, and pain.   ACTIVITY LIMITATIONS: carrying, lifting, bending, standing, squatting, stairs, transfers, bed mobility, continence, bathing, toileting, dressing, hygiene/grooming, locomotion level, and caring for others  PARTICIPATION LIMITATIONS: meal prep, cleaning, laundry, interpersonal relationship, driving,  shopping, community activity, occupation, yard work, and   difficulty with or unable to complete any activity that requires weight bearing, use of B LE, and/or balance including working, household and community mobility, walking, driving, going to family gatherings, avoiding incontinent episodes, playing with daughter, helping around the house, bed mobility, transfers  PERSONAL FACTORS: Past/current experiences, Time since onset of injury/illness/exacerbation, and 3+ comorbidities:   3 thoracic spine surgeries, thoracic MRI notes "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 Franky Macho on 05/25/2022, adhesive arachnoiditis, nerve pain are also affecting patient's functional outcome.   REHAB POTENTIAL: Fair due to severity and nature of condition.   CLINICAL DECISION MAKING: Evolving/moderate complexity  EVALUATION COMPLEXITY: Moderate   GOALS: Goals reviewed with patient? No  SHORT TERM GOALS: Target date: 01/09/2023. Target date updated to 05/28/2023 for all unmet goals on 03/05/2023.   Patient will be independent with initial home exercise program for self-management of symptoms. Baseline: Initial HEP to be provided at visit 2 as appropriate (12/26/22); Goal status: MET   LONG TERM GOALS: Target date: 03/20/2023  Patient will be independent with a long-term home exercise program for self-management of symptoms.  Baseline: Initial HEP to be provided at visit 2 as appropriate (12/26/22); participating as able (01/31/2023); patient currently participating as tolerated (03/05/2023);   Goal status: In-progress  2.  Patient will demonstrate improved FOTO by equal or greater than 10 points  by visit #13 to demonstrate improvement in overall condition and self-reported functional ability.  Baseline: to be tested visit 2 as appropriate (12/26/22); 30 at visit #3 (01/02/2023); 46 at visit #13 (03/05/2023);  Goal status: MET  3.  Patient will demonstrate the ability to ambulate equal or greater than 600 feet with LRAD during the 6 minute walk to improve his household and community mobility.  Baseline: 34 feet with bari-RW (12/26/22); 200 feet with bari-RW (01/31/2023); 500 feet with BRW (03/05/2023);  Goal status: In-progress  4.  Patient will complete 5 Time Sit To Stand Test from 19.5 inch surface or lower in equal or less than 15 seconds with no UE support to demonstrate improved transfer ability for improved household and toileting mobility.  Baseline: 23 seconds with heavy B UE support on RW from 19.5 inch plinth. Pain throbbing down the right LE.  (12/26/22); 19 seconds with heavy B UE support on RW from 19.5 inch plinth (03/05/2023);  Goal status: In-progress  5.  Patient will report worst pain equal or less than 3/10 with functional activities to improve his ability to complete basic household and community mobility. Baseline: up to 8/10 (12/26/22); reports 3/10 pain (01/31/2023); reports 4/10 pain (02/19/2023); up to 7/10 in the last 2 week s(03/05/2023):  Goal status: In-progress   PLAN:  PT FREQUENCY: 1-2x/week  PT DURATION: 12 weeks  PLANNED INTERVENTIONS: Therapeutic exercises, Therapeutic activity, Neuromuscular re-education, Balance training, Gait training, Patient/Family education, Self Care, Joint mobilization, Stair training, Orthotic/Fit training, DME instructions, Aquatic Therapy, Dry Needling, Electrical stimulation, Wheelchair mobility training, Spinal mobilization, Cryotherapy, Moist heat, Manual therapy, and Re-evaluation.  PLAN FOR NEXT SESSION:  update HEP as appropriate, neurodynamics, gait training, LE/core/functional strengthening, stretching, and balance as  tolerated, education, manual therapy as needed.   Cira Rue, PT, DPT 04/04/2023, 7:34 PM    [facility] [address] [place], [place] [address] P: [phone] I F: [phone]

## 2023-04-09 ENCOUNTER — Encounter: Payer: Self-pay | Admitting: Physical Therapy

## 2023-04-09 ENCOUNTER — Ambulatory Visit: Payer: Medicare Other | Admitting: Physical Therapy

## 2023-04-09 DIAGNOSIS — M5416 Radiculopathy, lumbar region: Secondary | ICD-10-CM

## 2023-04-09 DIAGNOSIS — M5459 Other low back pain: Secondary | ICD-10-CM | POA: Diagnosis not present

## 2023-04-09 DIAGNOSIS — R29818 Other symptoms and signs involving the nervous system: Secondary | ICD-10-CM

## 2023-04-09 DIAGNOSIS — M6281 Muscle weakness (generalized): Secondary | ICD-10-CM

## 2023-04-09 DIAGNOSIS — R262 Difficulty in walking, not elsewhere classified: Secondary | ICD-10-CM

## 2023-04-09 DIAGNOSIS — R296 Repeated falls: Secondary | ICD-10-CM

## 2023-04-09 NOTE — Therapy (Signed)
OUTPATIENT PHYSICAL THERAPY TREATMENT / PROGRESS NOTE Dates of reporting from 12/26/2022 to 04/09/2023   Patient Name: Travis Fitzpatrick MRN: [id number] DOB:07-23-1981, 42 y.o., male Today's Date: 04/09/23   END OF SESSION:  PT End of Session - 04/09/23 1856     Visit Number 20    Number of Visits 43    Date for PT Re-Evaluation 05/28/23    Authorization Type MEDICARE PART B reporting period from 12/26/2022    Progress Note Due on Visit 20    PT Start Time 1819    PT Stop Time 1900    PT Time Calculation (min) 41 min    Activity Tolerance Patient limited by fatigue;Patient limited by pain    Behavior During Therapy [facility] for tasks assessed/performed             Past Medical History:  Diagnosis Date   Bowel trouble    urgency   Medical history non-contributory    Urinary urgency    Past Surgical History:  Procedure Laterality Date   BACK SURGERY  2010   CIRCUMCISION     LUMBAR LAMINECTOMY/DECOMPRESSION MICRODISCECTOMY  07/20/2011   Procedure: LUMBAR LAMINECTOMY/DECOMPRESSION MICRODISCECTOMY;  Surgeon: Carmela Hurt;  Location: [facility];  Service: Neurosurgery;  Laterality: N/A;  right thoracotomy with thoracic eight-nine discectomy and fusion   LUMBAR LAMINECTOMY/DECOMPRESSION MICRODISCECTOMY Right 10/06/2021   Procedure: Right Lumbar Four-Five Microdiscectomy, Right Lumbar Five-Sacal One Foraminotomy;  Surgeon: Coletta Memos, MD;  Location: [facility];  Service: Neurosurgery;  Laterality: Right;  3C/RM 21   THORACIC DISCECTOMY  07/16/2012   Procedure: THORACIC DISCECTOMY;  Surgeon: Carmela Hurt, MD;  Location: [facility];  Service: Neurosurgery;  Laterality: Right;  RIGHT Thoracic seven-eight  thoracic diskectomy via thoracotomy by dr Laneta Simmers   THORACIC DISCECTOMY N/A 12/15/2014   Procedure: THORACIC SEVEN TO THORACIC NINE Laminectomy ;  Surgeon: Coletta Memos, MD;  Location: [facility];  Service: Neurosurgery;  Laterality: N/A;   THORACIC DISCECTOMY N/A 06/06/2017    Procedure: LAMINECTOMY THORACIC NINE-TEN;  Surgeon: Coletta Memos, MD;  Location: [facility];  Service: Neurosurgery;  Laterality: N/A;  LAMINECTOMY THORACIC NINE-TEN   THORACOTOMY  07/20/2011   Procedure: THORACOTOMY OPEN FOR SPINE SURGERY;  Surgeon: Norton Blizzard, MD;  Location: [facility];  Service: Vascular;  Laterality: N/A;   THORACOTOMY  07/16/2012   Procedure: THORACOTOMY OPEN FOR SPINE SURGERY;  Surgeon: Alleen Borne, MD;  Location: [facility];  Service: Thoracic;  Laterality: N/A;   Patient Active Problem List   Diagnosis Date Noted   Nerve pain 12/05/2022   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   S/P lumbar spinal fusion 05/25/2022   Synovial cyst of lumbar facet joint 05/25/2022   HNP (herniated nucleus pulposus), lumbar 10/06/2021   Hand pain 06/28/2021   Lumbar facet arthropathy 01/11/2021   Thoracic spondylosis with myelopathy 01/11/2021   Thoracic spinal stenosis 06/06/2017   Stenosis, spinal, thoracic 12/15/2014   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 09/22/2014   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/2011    PCP: Nira Retort  REFERRING PROVIDER: Ranelle Oyster, MD   REFERRING DIAG: thoracic disc disease with myelopathy, nerve pain, adhesive arachnoiditis  Rationale for Evaluation and Treatment: Rehabilitation  THERAPY DIAG:  Other low back pain  Other symptoms and signs involving the nervous system  Radiculopathy, lumbar region  Muscle weakness (generalized)  Repeated falls  Difficulty in walking, not elsewhere classified  ONSET DATE:  Suddenly started having weakness 11 years ago,  most recent episode of worsening October 2022, s/p right L4-5 laminotomy, microdiscectomy on 10/06/2021, now s/p L4-5 PLIF by Dr. Franky Macho on 05/25/2022.  PERTINENT HISTORY:  Patient is a 42 y.o. male who presents to outpatient physical therapy with a referral for medical diagnosis thoracic disc disease with myelopathy, nerve pain,  adhesive arachnoiditis. This patient's chief complaints consist of disabling low back and R >L leg pain and weakness with activity, bowel and bladder urgency, incontinence, and retention, leading to the following functional deficits: difficulty with or unable to complete any activity that requires weight bearing, use of B LE, and/or balance including working, household and community mobility, walking, driving, going to family gatherings, avoiding incontinent episodes, playing with daughter, helping around the house, bed mobility, transfers. 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 Franky Macho on 05/25/2022, adhesive arachnoiditis, nerve pain.  Patient denies hx of cancer, stroke, seizures, lung problems, heart problems, diabetes, unexplained weight loss, and osteoporosis.  SUBJECTIVE:                                                                                                                                                                                           SUBJECTIVE STATEMENT: Patient arrives with BRW. He states he is feeling pretty good today. He states he was light headed when he left here last time. He went home and went to bed and felt better the next day. He fell on Saturday when he suddenly felt like both legs had a pinched nerve when he turned a corner on his way to go to the bathroom. He tried to get up using a chair like he usually does, but was unable because it felt like his legs would not work. His wife tried to help by pulling on his underwear as he had done before, but  that didn't work, so he had to call his brother to help him get up. He did  not feel like he was injured after he fell. Patient states he feels PT is helping him with strength and stability. He states he can walk a little bit further than he could. It also helps with pain control, especially at the right hip and groin.   PAIN:  NPRS: 3/10 low back. 3/10 in R hip and nerve pain 3/10 down R >  L leg.   PRECAUTIONS: Fall  PATIENT GOALS: "To get better, walk, drive again, climb stairs, play with my daughter, help around the house again"  NEXT MD VISIT: 03/13/2023 follow up with Dr. Riley Kill.   OBJECTIVE   SELF-REPORTED FUNCTION FOTO score: 49/100 (Inflammatory Diseases of the Nervous System) questionnaire)     FUNCTIONAL/BALANCE TESTS: 6 Minute Walk Test: 400 feet with BRW. Unable to continue with 1:49 left due to low back and nerve pain as well as progressive LE weakness.  Pain up to 6/10   Five Time Sit to Stand (5TSTS): 20 seconds with heavy B UE support on RRW from 18.5 inch plinth. Painful in back of B calves, thighs, and lower back.   TODAY'S TREATMENT:   Manual therapy: to reduce pain and tissue tension, improve range of motion, neuromodulation, in order to promote improved ability to complete functional activities. HOOKLYING - LAD to RLE (relieves hip pain) 3x45 sec with belt (3 at start of session and 3 at end of session)  Therapeutic exercise: to centralize symptoms and improve ROM, strength, muscular endurance, and activity tolerance required for successful completion of functional activities.  - AMB overground BRW 1x400 feet, supervision, heavy UE support. Reports increasing pain down both legs with progressive weakness. To assess .  - seated lumbar flexion ball roll out, 1x3 min - sit <> stand 5 reps from 18.5 inch plinth for time to assess 5TSTS  - ambulation 2x50 feet with BRW and supervision.  - seated (propped position) long arc quad with R/L 15/17.5# AW, 3x10 each side.  (R side partial ROM due to too much pain from nerve tension with full R knee extension).  - AMB overground BRW 1x100 feet, SBA, heavy UE support. From clinic to vehicle down ramp and down curb.   Pt required multimodal cuing for proper technique and to facilitate improved neuromuscular control, strength, range of motion, and functional ability resulting in improved performance and form.  PATIENT EDUCATION:  Education details: Education on diagnosis, prognosis, POC, anatomy and physiology of current condition. Article handout.  Person educated: patient Education method: Explanation Education comprehension: verbalized understanding and needs further education  HOME EXERCISE PROGRAM: Verbally:  - seated lumbar flexion stretch with head down and one LE extended using rollator, 1x10 each side.  - curl up with legs elevated, 5x15 seconds - sidelying open book 1x10 each side  ASSESSMENT:  CLINICAL IMPRESSION: Patient has attended 20 physical therapy sessions since starting current episode of care on 12/26/2022. Since last progress note, patient continues to participate in HEP as able and tolerated and continues to demonstrate excellent motivation and participation in PT sessions. Patient continues to be limited by severe B LE pain and neurogenic weakness that worsens with activity, especially in weight bearing. He continues to be limited by feeling of pressure in his back, neurogenic pain down the back of both legs, and right sided hip/groin pain that is described as separate from the nerve pain and is significantly relieved by LAD through right hip. Patient's condition is chronic and debilitating and continued participation in mobility and strengthening is essential to preserve function. Continued PT is medically necessary to prevent functional decline and need for higher level of care. Patient reports improved strength and ambulation ability with physical therapy. Today's objective measures show  continued improvement since initial evaluation but he was not able to ambulate as far as at last progress note. Patient's symptoms fluctuate and he has actually been walking more overall in clinic over the past  several weeks. Patient also gets significant pain relief with LAD to the right hip at PT sessions. Patient continues to be limited by inconsistent exacerbation of pain following physical training and participation in PT.  Plan to continue with PT at a rate of 2x per week to help with pain control and maximize functional mobility and independence. Patient would benefit from continued management of limiting condition by skilled physical therapist to address remaining impairments and functional limitations to work towards stated goals and return to PLOF or maximal functional independence.   OBJECTIVE IMPAIRMENTS: Abnormal gait, decreased activity tolerance, decreased balance, decreased coordination, decreased endurance, decreased knowledge of condition, decreased knowledge of use of DME, decreased mobility, difficulty walking, decreased ROM, decreased strength, increased edema, impaired perceived functional ability, increased muscle spasms, impaired flexibility, impaired sensation, impaired tone, improper body mechanics, postural dysfunction, obesity, and pain.   ACTIVITY LIMITATIONS: carrying, lifting, bending, standing, squatting, stairs, transfers, bed mobility, continence, bathing, toileting, dressing, hygiene/grooming, locomotion level, and caring for others  PARTICIPATION LIMITATIONS: meal prep, cleaning, laundry, interpersonal relationship, driving, shopping, community activity, occupation, yard work, and   difficulty with or unable to complete any activity that requires weight bearing, use of B LE, and/or balance including working, household and community mobility, walking, driving, going to family gatherings, avoiding incontinent episodes, playing with daughter, helping around the house, bed  mobility, transfers  PERSONAL FACTORS: Past/current experiences, Time since onset of injury/illness/exacerbation, and 3+ comorbidities:   3 thoracic spine surgeries, thoracic MRI notes "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 Franky Macho on 05/25/2022, adhesive arachnoiditis, nerve pain are also affecting patient's functional outcome.   REHAB POTENTIAL: Fair due to severity and nature of condition.   CLINICAL DECISION MAKING: Evolving/moderate complexity  EVALUATION COMPLEXITY: Moderate   GOALS: Goals reviewed with patient? No  SHORT TERM GOALS: Target date: 01/09/2023. Target date updated to 05/28/2023 for all unmet goals on 03/05/2023.   Patient will be independent with initial home exercise program for self-management of symptoms. Baseline: Initial HEP to be provided at visit 2 as appropriate (12/26/22); Goal status: MET   LONG TERM GOALS: Target date: 03/20/2023  Patient will be independent with a long-term home exercise program for self-management of symptoms.  Baseline: Initial HEP to be provided at visit 2 as appropriate (12/26/22); participating as able (01/31/2023); patient currently participating as tolerated (03/05/2023; 04/09/2023);   Goal status: In-progress  2.  Patient will demonstrate improved FOTO by equal or greater than 10 points by visit #13 to demonstrate improvement in overall condition and self-reported functional ability.  Baseline: to be tested visit 2 as appropriate (12/26/22); 30 at visit #3 (01/02/2023); 46 at visit #13 (03/05/2023);  Goal status: MET  3.  Patient will demonstrate the ability to ambulate equal or greater than 600 feet with LRAD during  the 6 minute walk to improve his household and community mobility.  Baseline: 34 feet with bari-RW (12/26/22); 200 feet with bari-RW (01/31/2023); 500 feet with BRW (03/05/2023); 400 feet with BRW (04/09/2023);  Goal status: In-progress  4.  Patient will complete 5 Time Sit To Stand Test from 19.5 inch surface or lower in equal or less than 15 seconds with no UE support to demonstrate improved transfer ability for improved household  and toileting mobility.  Baseline: 23 seconds with heavy B UE support on RW from 19.5 inch plinth. Pain throbbing down the right LE.  (12/26/22); 19 seconds with heavy B UE support on RW from 19.5 inch plinth (03/05/2023); 20 seconds with heavy B UE support on RRW from 18.5 inch plinth. Painful in back of B calves, thighs, and lower back (04/09/2023);  Goal status: In-progress  5.  Patient will report worst pain equal or less than 3/10 with functional activities to improve his ability to complete basic household and community mobility. Baseline: up to 8/10 (12/26/22); reports 3/10 pain (01/31/2023); reports 4/10 pain (02/19/2023); up to 7/10 in the last 2 week s(03/05/2023): up to  7/10 in the last two weeks (04/09/2023);  Goal status: In-progress   PLAN:  PT FREQUENCY: 1-2x/week  PT DURATION: 12 weeks  PLANNED INTERVENTIONS: Therapeutic exercises, Therapeutic activity, Neuromuscular re-education, Balance training, Gait training, Patient/Family education, Self Care, Joint mobilization, Stair training, Orthotic/Fit training, DME instructions, Aquatic Therapy, Dry Needling, Electrical stimulation, Wheelchair mobility training, Spinal mobilization, Cryotherapy, Moist heat, Manual therapy, and Re-evaluation.  PLAN FOR NEXT SESSION:  update HEP as appropriate, neurodynamics, gait training, LE/core/functional strengthening, stretching, and balance as tolerated, education, manual therapy as needed.  Cira Rue, PT, DPT 04/09/2023, 7:21 PM   [facility] [address] [place], [place] [address] P: [phone] I F: [phone]

## 2023-04-10 ENCOUNTER — Encounter: Payer: Medicare Other | Attending: Physical Medicine & Rehabilitation | Admitting: Registered Nurse

## 2023-04-10 VITALS — BP 120/82 | HR 94 | Ht 70.0 in | Wt 339.0 lb

## 2023-04-10 DIAGNOSIS — Z4689 Encounter for fitting and adjustment of other specified devices: Secondary | ICD-10-CM | POA: Insufficient documentation

## 2023-04-10 DIAGNOSIS — M5104 Intervertebral disc disorders with myelopathy, thoracic region: Secondary | ICD-10-CM | POA: Insufficient documentation

## 2023-04-10 DIAGNOSIS — M47816 Spondylosis without myelopathy or radiculopathy, lumbar region: Secondary | ICD-10-CM | POA: Insufficient documentation

## 2023-04-10 DIAGNOSIS — G039 Meningitis, unspecified: Secondary | ICD-10-CM | POA: Insufficient documentation

## 2023-04-10 DIAGNOSIS — M4804 Spinal stenosis, thoracic region: Secondary | ICD-10-CM | POA: Insufficient documentation

## 2023-04-10 DIAGNOSIS — G894 Chronic pain syndrome: Secondary | ICD-10-CM | POA: Insufficient documentation

## 2023-04-10 NOTE — Progress Notes (Signed)
Subjective:    Patient ID: Travis Fitzpatrick, male    DOB: 04/29/1981, 42 y.o.   MRN: [id number]  HPI: Travis Fitzpatrick is a 42 y.o. 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 I have reviewed the Physical Therapist Wheelchair Evaluation Form and Concur with their Assessment and evaluation. The Power Wheelchair will be a great asset for Travis Fitzpatrick. This will help him in his home, also improving his independence with activities of daily living. Such as cooking, cleaning and laundry. 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 . Past Surgical History.  07/20/2011 LUMBAR LAMINECTOMY/DECOMPRESSION MICRODISCECTOMY N/A General  right thoracotomy with thoracic eight-nine discectomy and fusion    07/16/2012 THORACIC DISCECTOMY Right General  RIGHT Thoracic seven-eight  thoracic diskectomy via thoracotomy by dr Laneta Simmers    12/15/2014 THORACIC SEVEN TO THORACIC NINE Laminectomy  06/06/2017 LAMINECTOMY THORACIC NINE-TEN N/A General       10/06/2021 Right Lumbar Four-Five Microdiscectomy, Right Lumbar Five-Sacal One Foraminotomy   05/25/2022 LUMBAR FOUR-FIVE POSTERIOR LUMBAR INTERBODY FUSION   Travis Fitzpatrick states he has pain in his lower back, bilateral lower extremities, also reports right hip pain radiating into his groin. He rates his pain 3.   Travis Fitzpatrick reports  he had two falls last Tuesday he was walking to the bathroom and his legs gave out and last Saturday he walking to the bathroom and his legs gave out, he fell on his buttocks. His brothers helped him up.Educated on Falls Prevention he verbalizes understanding.     Pain Inventory Average Pain 5 Pain Right Now 3 My pain is intermittent, sharp, burning, dull, stabbing, tingling, aching, and pressure  In the last 24 hours, has pain interfered  with the following? General activity 7 Relation with others 7 Enjoyment of life 7 What TIME of day is your pain at its worst? morning , evening, and night Sleep (in general) Poor  Pain is worse with: walking, bending, sitting, standing, and some activites Pain improves with: rest, heat/ice, therapy/exercise, pacing activities, and medication Relief from Meds: 6  Family History  Problem Relation Age of Onset   Hypertension Father    Social History   Socioeconomic History   Marital status: Single    Spouse name: Not on file   Number of children: Not on file   Years of education: Not on file   Highest education level: Not on file  Occupational History   Not on file  Tobacco Use   Smoking status: Never   Smokeless tobacco: Never  Vaping Use   Vaping status: Never Used  Substance and Sexual Activity   Alcohol use: No    Alcohol/week: 0.0 standard drinks of alcohol   Drug use: No   Sexual activity: Never  Other Topics Concern   Not on file  Social History Narrative   Not on file   Social Determinants of Health   Financial Resource Strain: Medium Risk (08/30/2022)   Received from [facility], [facility]   Overall Financial Resource Strain (CARDIA)    Difficulty of Paying Living Expenses: Somewhat hard  Food Insecurity: Food Insecurity Present (08/30/2022)   Received from [facility], [facility]   Hunger Vital Sign    Worried About Running Out of Food in the Last Year: Never true  Ran Out of Food in the Last Year: Sometimes true  Transportation Needs: No Transportation Needs (01/25/2023)   Received from [facility], [facility]   [facility] - Transportation    Lack of Transportation (Medical): No    Lack of Transportation (Non-Medical): No  Physical Activity: Not on file  Stress: Not on file  Social Connections: Not on file   Past Surgical History:  Procedure Laterality Date   BACK  SURGERY  2010   CIRCUMCISION     LUMBAR LAMINECTOMY/DECOMPRESSION MICRODISCECTOMY  07/20/2011   Procedure: LUMBAR LAMINECTOMY/DECOMPRESSION MICRODISCECTOMY;  Surgeon: Carmela Hurt;  Location: [facility];  Service: Neurosurgery;  Laterality: N/A;  right thoracotomy with thoracic eight-nine discectomy and fusion   LUMBAR LAMINECTOMY/DECOMPRESSION MICRODISCECTOMY Right 10/06/2021   Procedure: Right Lumbar Four-Five Microdiscectomy, Right Lumbar Five-Sacal One Foraminotomy;  Surgeon: Coletta Memos, MD;  Location: [facility];  Service: Neurosurgery;  Laterality: Right;  3C/RM 21   THORACIC DISCECTOMY  07/16/2012   Procedure: THORACIC DISCECTOMY;  Surgeon: Carmela Hurt, MD;  Location: [facility];  Service: Neurosurgery;  Laterality: Right;  RIGHT Thoracic seven-eight  thoracic diskectomy via thoracotomy by dr Laneta Simmers   THORACIC DISCECTOMY N/A 12/15/2014   Procedure: THORACIC SEVEN TO THORACIC NINE Laminectomy ;  Surgeon: Coletta Memos, MD;  Location: [facility];  Service: Neurosurgery;  Laterality: N/A;   THORACIC DISCECTOMY N/A 06/06/2017   Procedure: LAMINECTOMY THORACIC NINE-TEN;  Surgeon: Coletta Memos, MD;  Location: [facility];  Service: Neurosurgery;  Laterality: N/A;  LAMINECTOMY THORACIC NINE-TEN   THORACOTOMY  07/20/2011   Procedure: THORACOTOMY OPEN FOR SPINE SURGERY;  Surgeon: Norton Blizzard, MD;  Location: [facility];  Service: Vascular;  Laterality: N/A;   THORACOTOMY  07/16/2012   Procedure: THORACOTOMY OPEN FOR SPINE SURGERY;  Surgeon: Alleen Borne, MD;  Location: [facility];  Service: Thoracic;  Laterality: N/A;   Past Surgical History:  Procedure Laterality Date   BACK SURGERY  2010   CIRCUMCISION     LUMBAR LAMINECTOMY/DECOMPRESSION MICRODISCECTOMY  07/20/2011   Procedure: LUMBAR LAMINECTOMY/DECOMPRESSION MICRODISCECTOMY;  Surgeon: Carmela Hurt;  Location: [facility];  Service: Neurosurgery;  Laterality: N/A;  right thoracotomy with thoracic eight-nine discectomy and fusion    LUMBAR LAMINECTOMY/DECOMPRESSION MICRODISCECTOMY Right 10/06/2021   Procedure: Right Lumbar Four-Five Microdiscectomy, Right Lumbar Five-Sacal One Foraminotomy;  Surgeon: Coletta Memos, MD;  Location: [facility];  Service: Neurosurgery;  Laterality: Right;  3C/RM 21   THORACIC DISCECTOMY  07/16/2012   Procedure: THORACIC DISCECTOMY;  Surgeon: Carmela Hurt, MD;  Location: [facility];  Service: Neurosurgery;  Laterality: Right;  RIGHT Thoracic seven-eight  thoracic diskectomy via thoracotomy by dr Laneta Simmers   THORACIC DISCECTOMY N/A 12/15/2014   Procedure: THORACIC SEVEN TO THORACIC NINE Laminectomy ;  Surgeon: Coletta Memos, MD;  Location: [facility];  Service: Neurosurgery;  Laterality: N/A;   THORACIC DISCECTOMY N/A 06/06/2017   Procedure: LAMINECTOMY THORACIC NINE-TEN;  Surgeon: Coletta Memos, MD;  Location: [facility];  Service: Neurosurgery;  Laterality: N/A;  LAMINECTOMY THORACIC NINE-TEN   THORACOTOMY  07/20/2011   Procedure: THORACOTOMY OPEN FOR SPINE SURGERY;  Surgeon: Norton Blizzard, MD;  Location: [facility];  Service: Vascular;  Laterality: N/A;   THORACOTOMY  07/16/2012   Procedure: THORACOTOMY OPEN FOR SPINE SURGERY;  Surgeon: Alleen Borne, MD;  Location: [facility];  Service: Thoracic;  Laterality: N/A;   Past Medical History:  Diagnosis Date   Bowel trouble    urgency  Medical history non-contributory    Urinary urgency    BP 120/82   Pulse 94   Ht 5\' 10"  (1.778 m)   Wt (!) 339 lb (153.8 kg)   SpO2 97%   BMI 48.64 kg/m   Opioid Risk Score:   Fall Risk Score:  `1  Depression screen PHQ 2/9     03/13/2023    1:10 PM 12/19/2022    2:41 PM 12/05/2022   10:16 AM 06/28/2021    9:29 AM 03/29/2021    9:39 AM 01/11/2021   10:19 AM  Depression screen PHQ 2/9  Decreased Interest 0 0 0 0 1 3  Down, Depressed, Hopeless 0 0 0 0 1 1  PHQ - 2 Score 0 0 0 0 2 4  Altered sleeping      3  Tired, decreased energy      3  Change in appetite      3  Feeling bad or failure about yourself        0  Trouble concentrating      2  Moving slowly or fidgety/restless      1  Suicidal thoughts      0  PHQ-9 Score      16      Review of Systems  Musculoskeletal:  Positive for back pain and gait problem.       Hip, groin and back of leg pain spasms  Neurological:  Positive for numbness.  Psychiatric/Behavioral:  The patient is nervous/anxious.   All other systems reviewed and are negative.      Objective:   Physical Exam Vitals and nursing note reviewed.  Constitutional:      Appearance: Normal appearance. He is obese.  Cardiovascular:     Rate and Rhythm: Normal rate and regular rhythm.     Pulses: Normal pulses.     Heart sounds: Normal heart sounds.  Musculoskeletal:     Cervical back: Normal range of motion and neck supple.     Comments: Normal Muscle Bulk and Muscle Testing Reveals:  Upper Extremities:  Full ROM and Muscle Strength 5/5 Lumbar Hypersensitivity Right Greater Trochanter Tenderness Lower Extremities: Right: Decreased ROM and Muscle Strength 5/5 Right Lower Extremity Flexion Produces Pain into his Right Hip and Right Lower Extremity Left Lower Extremity : Full ROM and Muscle Strength 5/5 Arises from table Slowly using walker or support Antalgic  Gait     Skin:    General: Skin is warm and dry.  Neurological:     Mental Status: He is alert and oriented to person, place, and time.  Psychiatric:        Mood and Affect: Mood normal.        Behavior: Behavior normal.          Assessment & Plan:  Thoracic Disc Disease with Myelopathy/ Thoracic Spinal Stenosis: Continue Physical Therapy as Tolerated. Continue current medication regimen. Continue to Monitor.  Lumbar Facet Arthropathy: Continue Physical Therapy as Tolerated. Continue current medication regimen. Continue to Monitor.  Adhesive Arachnoiditis: Continue to Monitor  Chronic Pain Syndrome: Continue Diclofenac, Nortriptyline,  Gabapentin, Cymbalta. Continue to Monitor.  F/U with Dr  Riley Kill

## 2023-04-11 ENCOUNTER — Ambulatory Visit: Payer: Medicare Other | Admitting: Physical Therapy

## 2023-04-16 ENCOUNTER — Encounter: Payer: Self-pay | Admitting: Physical Therapy

## 2023-04-16 ENCOUNTER — Ambulatory Visit: Payer: Medicare Other | Attending: Physical Medicine & Rehabilitation

## 2023-04-16 DIAGNOSIS — M6281 Muscle weakness (generalized): Secondary | ICD-10-CM | POA: Insufficient documentation

## 2023-04-16 DIAGNOSIS — R262 Difficulty in walking, not elsewhere classified: Secondary | ICD-10-CM | POA: Insufficient documentation

## 2023-04-16 DIAGNOSIS — R296 Repeated falls: Secondary | ICD-10-CM | POA: Insufficient documentation

## 2023-04-16 DIAGNOSIS — M5459 Other low back pain: Secondary | ICD-10-CM | POA: Insufficient documentation

## 2023-04-16 DIAGNOSIS — M5416 Radiculopathy, lumbar region: Secondary | ICD-10-CM | POA: Insufficient documentation

## 2023-04-16 DIAGNOSIS — R29818 Other symptoms and signs involving the nervous system: Secondary | ICD-10-CM | POA: Diagnosis present

## 2023-04-16 NOTE — Therapy (Signed)
OUTPATIENT PHYSICAL THERAPY TREATMENT  Patient Name: Travis Fitzpatrick MRN: [id number] DOB:24-Oct-1980, 42 y.o., male Today's Date: 04/17/23   END OF SESSION:  PT End of Session - 04/16/23 1813     Visit Number 21    Number of Visits 43    Date for PT Re-Evaluation 05/28/23    Authorization Type MEDICARE PART B reporting period from 12/26/2022    Progress Note Due on Visit 20    PT Start Time 1613    PT Stop Time 1656    PT Time Calculation (min) 43 min    Activity Tolerance Patient limited by fatigue;Patient limited by pain    Behavior During Therapy [facility] for tasks assessed/performed              Past Medical History:  Diagnosis Date   Bowel trouble    urgency   Medical history non-contributory    Urinary urgency    Past Surgical History:  Procedure Laterality Date   BACK SURGERY  2010   CIRCUMCISION     LUMBAR LAMINECTOMY/DECOMPRESSION MICRODISCECTOMY  07/20/2011   Procedure: LUMBAR LAMINECTOMY/DECOMPRESSION MICRODISCECTOMY;  Surgeon: Carmela Hurt;  Location: [facility];  Service: Neurosurgery;  Laterality: N/A;  right thoracotomy with thoracic eight-nine discectomy and fusion   LUMBAR LAMINECTOMY/DECOMPRESSION MICRODISCECTOMY Right 10/06/2021   Procedure: Right Lumbar Four-Five Microdiscectomy, Right Lumbar Five-Sacal One Foraminotomy;  Surgeon: Coletta Memos, MD;  Location: [facility];  Service: Neurosurgery;  Laterality: Right;  3C/RM 21   THORACIC DISCECTOMY  07/16/2012   Procedure: THORACIC DISCECTOMY;  Surgeon: Carmela Hurt, MD;  Location: [facility];  Service: Neurosurgery;  Laterality: Right;  RIGHT Thoracic seven-eight  thoracic diskectomy via thoracotomy by dr Laneta Simmers   THORACIC DISCECTOMY N/A 12/15/2014   Procedure: THORACIC SEVEN TO THORACIC NINE Laminectomy ;  Surgeon: Coletta Memos, MD;  Location: [facility];  Service: Neurosurgery;  Laterality: N/A;   THORACIC DISCECTOMY N/A 06/06/2017   Procedure: LAMINECTOMY THORACIC NINE-TEN;  Surgeon: Coletta Memos, MD;   Location: [facility];  Service: Neurosurgery;  Laterality: N/A;  LAMINECTOMY THORACIC NINE-TEN   THORACOTOMY  07/20/2011   Procedure: THORACOTOMY OPEN FOR SPINE SURGERY;  Surgeon: Norton Blizzard, MD;  Location: [facility];  Service: Vascular;  Laterality: N/A;   THORACOTOMY  07/16/2012   Procedure: THORACOTOMY OPEN FOR SPINE SURGERY;  Surgeon: Alleen Borne, MD;  Location: [facility];  Service: Thoracic;  Laterality: N/A;   Patient Active Problem List   Diagnosis Date Noted   Nerve pain 12/05/2022   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   S/P lumbar spinal fusion 05/25/2022   Synovial cyst of lumbar facet joint 05/25/2022   HNP (herniated nucleus pulposus), lumbar 10/06/2021   Hand pain 06/28/2021   Lumbar facet arthropathy 01/11/2021   Thoracic spondylosis with myelopathy 01/11/2021   Thoracic spinal stenosis 06/06/2017   Stenosis, spinal, thoracic 12/15/2014   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 09/22/2014   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/2011    PCP: Nira Retort  REFERRING PROVIDER: Ranelle Oyster, MD   REFERRING DIAG: thoracic disc disease with myelopathy, nerve pain, adhesive arachnoiditis  Rationale for Evaluation and Treatment: Rehabilitation  THERAPY DIAG:  Other low back pain  Difficulty in walking, not elsewhere classified  Other symptoms and signs involving the nervous system  Muscle weakness (generalized)  ONSET DATE:  Suddenly started having weakness 11 years ago, most recent episode of worsening October 2022, s/p right L4-5 laminotomy, microdiscectomy on 10/06/2021, now s/p L4-5  PLIF by Dr. Franky Macho on 05/25/2022.  PERTINENT HISTORY:  Patient is a 42 y.o. male who presents to outpatient physical therapy with a referral for medical diagnosis thoracic disc disease with myelopathy, nerve pain, adhesive arachnoiditis. This patient's chief complaints consist of disabling low back and R >L leg pain and weakness with  activity, bowel and bladder urgency, incontinence, and retention, leading to the following functional deficits: difficulty with or unable to complete any activity that requires weight bearing, use of B LE, and/or balance including working, household and community mobility, walking, driving, going to family gatherings, avoiding incontinent episodes, playing with daughter, helping around the house, bed mobility, transfers. 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 Franky Macho on 05/25/2022, adhesive arachnoiditis, nerve pain.  Patient denies hx of cancer, stroke, seizures, lung problems, heart problems, diabetes, unexplained weight loss, and osteoporosis.  SUBJECTIVE:                                                                                                                                                                                           SUBJECTIVE STATEMENT:   Patient reports he has been having radicular pain down both legs (R>L) since last week (Wednesday). The R leg goes down the back of the leg to the front of the shin and reporting as a very sharp pain. The L leg goes down the back of the leg into the calf. Has had 3 near falls since last Thursday due to whne the pain hits his knees buckle. Able to self recover with use of the BRW.   PAIN:  NPRS: 3/10 low back. 3/10 in R hip and nerve pain 3/10 down R > L leg.   PRECAUTIONS: Fall  PATIENT GOALS: "To get better, walk, drive again, climb stairs, play with my daughter, help around the house again"  NEXT MD VISIT: 03/13/2023  follow up with Dr. Riley Kill.   OBJECTIVE   SELF-REPORTED FUNCTION FOTO score: 49/100 (Inflammatory Diseases of the Nervous System) questionnaire)     FUNCTIONAL/BALANCE TESTS: 6 Minute Walk Test: 400 feet with BRW. Unable to continue with 1:49 left due to low back and nerve pain as well as progressive LE weakness.  Pain up to 6/10   Five Time Sit to Stand (5TSTS): 20 seconds with heavy B UE  support on RRW from 18.5 inch plinth. Painful in back of B calves, thighs, and lower back.   TODAY'S TREATMENT:      (+) slump test bilaterally - R>L   (+) SLR test bilaterally - R>L  -TTP at L3 R TP - unable to tolerate mobilizations due to significant pain    Therapeutic exercise: to centralize symptoms and improve ROM, strength, muscular endurance, and activity tolerance required for successful completion of functional activities.  -Ambulated from waiting room to large plinth in clinic with BRW and CGA -- significant buckling in B knees due to radicular, sharp pain, required min-modA to recover.  -Supine sciatic nerve glide bilaterally x 10 -Prone x 2 minutes - symptoms diminished after 2 minutes  -Prone propped on elbows x 2 minutes - symptoms diminished after 2 minutes -Prone press up x 7 - pain increased after 7 reps -Seated sciatic nerve glide bilaterally x 10  -seated hamstring stretch 2 x 30 seconds  -sit to stand x 10 with B UE support    -Ambulated from clinic to car with BRW and gait belt for safety due to hx of buckling - no buckling noted but patient having to take standing rest breaks due to sharp, cramping pain in the R shin. Encouraged hydration in hopes this assists with cramping although patient states he feels that this is an exacerbation/flare up of his back.   Pt required multimodal cuing for proper technique and to facilitate improved neuromuscular control, strength, range of motion, and functional ability resulting in improved performance and form.  PATIENT EDUCATION:   Education details: Education on diagnosis, prognosis, POC, anatomy and physiology of current condition. Article handout.  Person educated: patient Education method: Explanation Education comprehension: verbalized understanding and needs further education  HOME EXERCISE PROGRAM: Verbally:  - seated lumbar flexion stretch with head down and one LE extended using rollator, 1x10 each side.  - curl up with legs elevated, 5x15 seconds - sidelying open book 1x10 each side  ASSESSMENT:  CLINICAL IMPRESSION:   Patient arrives to treatment session complaining of increased back pain with radicular symptoms down B LE (R>L). During ambulation into clinic, patient experienced B knee buckling due to sharp radicular pain requiring min-modA to recover. Patient with (+) slump and SLR tests bilaterally and TTP at R L3 TP with inability to tolerate mobilization due to significant pain. Focused on extension biased exercise to assist with reduction in pain. Pain significantly improved with prone with progression to propped on elbow to prone press ups. Ambulated with BRW and gait belt out to the car for safety with patient complaining of sharp, cramping pain in R anterior shin after 15' and continuous until car (~200') with standing rest breaks required. Encouraged hydration prior to next session to assist with cramping, however patient repotrs feeling as if this is an exacerbation/flare up of his condition primarily. Will reassess next session. Patient would benefit from continued management of limiting condition by skilled physical therapist to address remaining impairments and functional limitations to work towards stated goals and return to PLOF or maximal functional independence.   OBJECTIVE IMPAIRMENTS: Abnormal gait, decreased activity tolerance, decreased balance, decreased coordination, decreased endurance, decreased knowledge of condition, decreased knowledge of use of DME, decreased mobility, difficulty  walking, decreased ROM, decreased strength, increased edema, impaired perceived functional ability, increased muscle spasms, impaired flexibility, impaired sensation, impaired tone, improper body mechanics, postural dysfunction, obesity, and pain.   ACTIVITY LIMITATIONS: carrying, lifting, bending, standing, squatting, stairs, transfers, bed mobility, continence,  bathing, toileting, dressing, hygiene/grooming, locomotion level, and caring for others  PARTICIPATION LIMITATIONS: meal prep, cleaning, laundry, interpersonal relationship, driving, shopping, community activity, occupation, yard work, and   difficulty with or unable to complete any activity that requires weight bearing, use of B LE, and/or balance including working, household and community mobility, walking, driving, going to family gatherings, avoiding incontinent episodes, playing with daughter, helping around the house, bed mobility, transfers  PERSONAL FACTORS: Past/current experiences, Time since onset of injury/illness/exacerbation, and 3+ comorbidities:   3 thoracic spine surgeries, thoracic MRI notes "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 Franky Macho on 05/25/2022, adhesive arachnoiditis, nerve pain are also affecting patient's functional outcome.   REHAB POTENTIAL: Fair due to severity and nature of condition.   CLINICAL DECISION MAKING: Evolving/moderate complexity  EVALUATION COMPLEXITY: Moderate   GOALS: Goals reviewed with patient? No  SHORT TERM GOALS: Target date: 01/09/2023. Target date updated to 05/28/2023 for all unmet goals on 03/05/2023.   Patient will be independent with initial home exercise program  for self-management of symptoms. Baseline: Initial HEP to be provided at visit 2 as appropriate (12/26/22); Goal status: MET   LONG TERM GOALS: Target date: 03/20/2023  Patient will be independent with a long-term home exercise program for self-management of symptoms.  Baseline: Initial HEP to be provided at visit 2 as appropriate (12/26/22); participating as able (01/31/2023); patient currently participating as tolerated (03/05/2023; 04/09/2023);   Goal status: In-progress  2.  Patient will demonstrate improved FOTO by equal or greater than 10 points by visit #13 to demonstrate improvement in overall condition and self-reported functional ability.  Baseline: to be tested visit 2 as appropriate (12/26/22); 30 at visit #3 (01/02/2023); 46 at visit #13 (03/05/2023);  Goal status: MET  3.  Patient will demonstrate the ability to ambulate equal or greater than 600 feet with LRAD during the 6 minute walk to improve his household and community mobility.  Baseline: 34 feet with bari-RW (12/26/22); 200 feet with bari-RW (01/31/2023); 500 feet with BRW (03/05/2023); 400 feet with BRW (04/09/2023);  Goal status: In-progress  4.  Patient will complete 5 Time Sit To Stand Test from 19.5 inch surface or lower in equal or less than 15 seconds with no UE support to demonstrate improved transfer ability for improved household and toileting mobility.  Baseline: 23 seconds with heavy B UE support on RW from 19.5 inch plinth. Pain throbbing down the right LE.  (12/26/22); 19 seconds with heavy B UE support on RW from 19.5 inch plinth (03/05/2023); 20 seconds with heavy B UE support on RRW from 18.5 inch plinth. Painful in back of B calves, thighs, and lower back (04/09/2023);  Goal status: In-progress  5.  Patient will report worst pain equal or less than 3/10 with functional activities to improve his ability to complete basic household and community mobility. Baseline: up to 8/10 (12/26/22); reports 3/10 pain (01/31/2023);  reports 4/10 pain (02/19/2023); up to 7/10 in the last 2 week s(03/05/2023): up to  7/10 in the last two weeks (04/09/2023);  Goal status: In-progress   PLAN:  PT FREQUENCY: 1-2x/week  PT DURATION: 12 weeks  PLANNED INTERVENTIONS: Therapeutic exercises, Therapeutic activity, Neuromuscular re-education, Balance training, Gait training, Patient/Family education, Self Care, Joint  mobilization, Stair training, Orthotic/Fit training, DME instructions, Aquatic Therapy, Dry Needling, Electrical stimulation, Wheelchair mobility training, Spinal mobilization, Cryotherapy, Moist heat, Manual therapy, and Re-evaluation.  PLAN FOR NEXT SESSION:  update HEP as appropriate, neurodynamics, gait training, LE/core/functional strengthening, stretching, and balance as tolerated, education, manual therapy as needed.  Viviann Spare, PT, DPT 04/17/2023, 8:03 AM   [facility] [address] [place], [place] [address] P: [phone] I F: [phone]

## 2023-04-18 ENCOUNTER — Ambulatory Visit: Payer: Medicare Other | Admitting: Physical Therapy

## 2023-04-18 ENCOUNTER — Encounter: Payer: Self-pay | Admitting: Registered Nurse

## 2023-04-18 DIAGNOSIS — M6281 Muscle weakness (generalized): Secondary | ICD-10-CM

## 2023-04-18 DIAGNOSIS — M5459 Other low back pain: Secondary | ICD-10-CM

## 2023-04-18 DIAGNOSIS — R262 Difficulty in walking, not elsewhere classified: Secondary | ICD-10-CM

## 2023-04-18 DIAGNOSIS — R29818 Other symptoms and signs involving the nervous system: Secondary | ICD-10-CM

## 2023-04-23 ENCOUNTER — Ambulatory Visit: Payer: Medicare Other | Admitting: Physical Therapy

## 2023-04-23 DIAGNOSIS — R29818 Other symptoms and signs involving the nervous system: Secondary | ICD-10-CM

## 2023-04-23 DIAGNOSIS — R262 Difficulty in walking, not elsewhere classified: Secondary | ICD-10-CM

## 2023-04-23 DIAGNOSIS — M6281 Muscle weakness (generalized): Secondary | ICD-10-CM

## 2023-04-23 DIAGNOSIS — R296 Repeated falls: Secondary | ICD-10-CM

## 2023-04-23 DIAGNOSIS — M5416 Radiculopathy, lumbar region: Secondary | ICD-10-CM

## 2023-04-23 DIAGNOSIS — M5459 Other low back pain: Secondary | ICD-10-CM

## 2023-04-23 NOTE — Therapy (Signed)
OUTPATIENT PHYSICAL THERAPY TREATMENT  Patient Name: Travis Fitzpatrick MRN: [id number] DOB:February 04, 1981, 42 y.o., male Today's Date: 04/23/23   END OF SESSION:  PT End of Session - 04/23/23 1936     Visit Number 22    Number of Visits 43    Date for PT Re-Evaluation 05/28/23    Authorization Type MEDICARE PART B reporting period from 04/09/2023    Progress Note Due on Visit 30    PT Start Time 1820    PT Stop Time 1910    PT Time Calculation (min) 50 min    Activity Tolerance Patient limited by pain    Behavior During Therapy [facility] for tasks assessed/performed               Past Medical History:  Diagnosis Date   Bowel trouble    urgency   Medical history non-contributory    Urinary urgency    Past Surgical History:  Procedure Laterality Date   BACK SURGERY  2010   CIRCUMCISION     LUMBAR LAMINECTOMY/DECOMPRESSION MICRODISCECTOMY  07/20/2011   Procedure: LUMBAR LAMINECTOMY/DECOMPRESSION MICRODISCECTOMY;  Surgeon: Carmela Hurt;  Location: [facility];  Service: Neurosurgery;  Laterality: N/A;  right thoracotomy with thoracic eight-nine discectomy and fusion   LUMBAR LAMINECTOMY/DECOMPRESSION MICRODISCECTOMY Right 10/06/2021   Procedure: Right Lumbar Four-Five Microdiscectomy, Right Lumbar Five-Sacal One Foraminotomy;  Surgeon: Coletta Memos, MD;  Location: [facility];  Service: Neurosurgery;  Laterality: Right;  3C/RM 21   THORACIC DISCECTOMY  07/16/2012   Procedure: THORACIC DISCECTOMY;  Surgeon: Carmela Hurt, MD;  Location: [facility];  Service: Neurosurgery;  Laterality: Right;  RIGHT Thoracic seven-eight  thoracic diskectomy via thoracotomy by dr Laneta Simmers   THORACIC DISCECTOMY N/A 12/15/2014   Procedure: THORACIC SEVEN TO THORACIC NINE Laminectomy ;  Surgeon: Coletta Memos, MD;  Location: [facility];  Service: Neurosurgery;  Laterality: N/A;   THORACIC DISCECTOMY N/A 06/06/2017   Procedure: LAMINECTOMY THORACIC NINE-TEN;  Surgeon: Coletta Memos, MD;  Location: [facility];   Service: Neurosurgery;  Laterality: N/A;  LAMINECTOMY THORACIC NINE-TEN   THORACOTOMY  07/20/2011   Procedure: THORACOTOMY OPEN FOR SPINE SURGERY;  Surgeon: Norton Blizzard, MD;  Location: [facility];  Service: Vascular;  Laterality: N/A;   THORACOTOMY  07/16/2012   Procedure: THORACOTOMY OPEN FOR SPINE SURGERY;  Surgeon: Alleen Borne, MD;  Location: [facility];  Service: Thoracic;  Laterality: N/A;   Patient Active Problem List   Diagnosis Date Noted   Nerve pain 12/05/2022   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   S/P lumbar spinal fusion 05/25/2022   Synovial cyst of lumbar facet joint 05/25/2022   HNP (herniated nucleus pulposus), lumbar 10/06/2021   Hand pain 06/28/2021   Lumbar facet arthropathy 01/11/2021   Thoracic spondylosis with myelopathy 01/11/2021   Thoracic spinal stenosis 06/06/2017   Stenosis, spinal, thoracic 12/15/2014   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 09/22/2014   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/2011    PCP: Nira Retort  REFERRING PROVIDER: Ranelle Oyster, MD   REFERRING DIAG: thoracic disc disease with myelopathy, nerve pain, adhesive arachnoiditis  Rationale for Evaluation and Treatment: Rehabilitation  THERAPY DIAG:  Other low back pain  Other symptoms and signs involving the nervous system  Radiculopathy, lumbar region  Muscle weakness (generalized)  Repeated falls  Difficulty in walking, not elsewhere classified  ONSET DATE:  Suddenly started having weakness 11 years ago, most recent episode of worsening October 2022, s/p right L4-5 laminotomy, microdiscectomy  on 10/06/2021, now s/p L4-5 PLIF by Dr. Franky Macho on 05/25/2022.  PERTINENT HISTORY:  Patient is a 42 y.o. male who presents to outpatient physical therapy with a referral for medical diagnosis thoracic disc disease with myelopathy, nerve pain, adhesive arachnoiditis. This patient's chief complaints consist of disabling low back and R  >L leg pain and weakness with activity, bowel and bladder urgency, incontinence, and retention, leading to the following functional deficits: difficulty with or unable to complete any activity that requires weight bearing, use of B LE, and/or balance including working, household and community mobility, walking, driving, going to family gatherings, avoiding incontinent episodes, playing with daughter, helping around the house, bed mobility, transfers. 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 Franky Macho on 05/25/2022, adhesive arachnoiditis, nerve pain.  Patient denies hx of cancer, stroke, seizures, lung problems, heart problems, diabetes, unexplained weight loss, and osteoporosis.  SUBJECTIVE:                                                                                                                                                                                           SUBJECTIVE STATEMENT:    Patient reports he has been having a lot of trouble with new pain that started when he was walking into his last PT appointment before he started any exercises. He states this pain is in the posterior hip and circles to the groin, goes down into the posterior thigh and comes down from the front of the hip down the anterior thigh to the anterior lower leg, where it feels like it spreads out into a circle on the front of his right lower leg. He notices this pain when he bears weight on his right leg and when he sits "wrong" (leaning over to the right side while sitting). When this happens while  sitting the pain comes from the back of his right hip, travels down the posterior thigh, then moves laterally around the knee to the anterior lower leg and spreads out in the same place as when he is standing. It feels like a stabbing, sharp,  lightening bolts pain. It resembles his previous nerve and hip pain together and he has weakness too, where his right knee buckled and he almost fell. He states prone positioning and interventions last PT session helped  him some ( and helped him get to the car) but then the pain came back when he tried to stand up and when he put pressure on his legs. He tried this at home as well and it helped while he was laying down but the pain returned again when he got up. He had some success with prone positioning at home once, but the second time it did not help. He is unclear if it made him worse the time he was prone and it did not help.  He had to stay in the bed several days after last PT session. He has been having some photosensitivity and auditory sensitivity recently as well as blurred vision when watching TV.   PAIN:  NPRS: 3/10 low back. 3/10 in R hip and nerve pain 3/10 down R > L leg.   PRECAUTIONS: Fall  PATIENT GOALS: "To get better, walk, drive again, climb stairs, play with my daughter, help around the house again"  NEXT MD VISIT: 03/13/2023 follow up with Dr. Riley Kill.   OBJECTIVE   TODAY'S TREATMENT:      Manual therapy: to reduce pain and tissue tension, improve range of motion, neuromodulation, in order to promote improved ability to complete functional activities. SUPINE - LAD through right hip with belt, 1x27 seconds on 4th attempt after several attempts with inability to tolerate (discontinued due to increased concordant shooting pain in right LE).   Therapeutic exercise: to centralize symptoms and improve ROM, strength, muscular endurance, and activity tolerance required for successful completion of functional activities.  - prone lying 1x3  minutes  - prone on elbows 1x3 minutes - prone press up, 1x4 (discontinued due to intolerable pain with each rep)  - prone <> sit transfer x 2 - ambulation with BRW 50 feet to plinth that can incline - pt reported slight reduction in sharpness of R LE pain with weight bearing.  - prone lying 1x3 minutes head in blue cradle.  - prone lying with head of bed incline 10 degrees (head in blue cradle), 3 min. Worse at first, gradually better except right calf and anterior knee by end of 3 min.  - prone lying with head of bed incline 15 degrees (head in blue cradle), 2 min. Worse initially and not continued worsening or improving. Discontinued due to continued pain.  - prone lying with head of bed incline 10 degrees (head in blue cradle), 2 min. Better compared to 15 degree incline, continues to be worse in right calf and anterior knee than when last at 10 degrees incline. Not really worsening while there but no improvement, so discontinued.  - prone lying 1x3 minutes face in blue cradle. (Instantly better).  - prone on elbows 3 minutes - Ambulated from clinic to car with BRW with close wheelchair follow for safety due to hx of buckling - no buckling noted but patient having to take seated rest break in waiting room.   Patient had severe R LE pain when performing car transfer and his wife helped him lift his right leg into the vehicle that helped relieve the pain.   Pt required multimodal cuing for proper technique and to facilitate improved neuromuscular control, strength, range of motion, and functional ability resulting in improved performance and form.  PATIENT EDUCATION:  Education details: Exercise purpose/form. Self management techniques.  Person educated: patient Education method: Explanation Education comprehension: verbalized understanding and needs further education  HOME EXERCISE PROGRAM: Verbally:  - seated lumbar flexion stretch with head down and one  LE extended using rollator, 1x10  each side.  - curl up with legs elevated, 5x15 seconds - sidelying open book 1x10 each side  ASSESSMENT:  CLINICAL IMPRESSION:    Patient arrives with report of significant worsening of his symptoms and new symptoms down the front of his right leg with sudden onset while he walked into the clinic at his last appointment (before any exercises). He seemed to get some temporary relief from pain with prone positioning progressing towards lumbar extension last session and at one time at home, so it was utilized again and tested to see if it would help to progress him into more gentle and passive lumbar extension with prone lying with head of bed gradually elevated. Patient had improvement in symptoms with prone lying and prone on elbows but did not have full resolution of symptoms with prone lying at 10 degrees incline and had only worsening of symptoms with 15 degree incline. After prone interventions, patient was able to ambulate about 10 feet with BRW with improved pain compared to when he arrived, but then the shooting pain in the R LE returned, making it difficult to ambulate to and enter vehicle. Relief from prone and mild extension seems to be only temporary and only tolerated in mid range. Patient advised that he may want to move up his follow up with Dr. Riley Kill, and patient said he would consider it. Patient reports last time he saw Dr. Riley Kill he recommended a spinal cord stimulator that patient would like to avoid. Patient was unable to tolerate long axis distraction through the right hip today, that has previously helped with his right hip pain. Plan to explore flexion based exercises for pain relief next session. Patient would benefit from continued management of limiting condition by skilled physical therapist to address remaining impairments and functional limitations to work towards stated goals and return to PLOF or maximal functional independence.    OBJECTIVE IMPAIRMENTS: Abnormal gait,  decreased activity tolerance, decreased balance, decreased coordination, decreased endurance, decreased knowledge of condition, decreased knowledge of use of DME, decreased mobility, difficulty walking, decreased ROM, decreased strength, increased edema, impaired perceived functional ability, increased muscle spasms, impaired flexibility, impaired sensation, impaired tone, improper body mechanics, postural dysfunction, obesity, and pain.   ACTIVITY LIMITATIONS: carrying, lifting, bending, standing, squatting, stairs, transfers, bed mobility, continence, bathing, toileting, dressing, hygiene/grooming, locomotion level, and caring for others  PARTICIPATION LIMITATIONS: meal prep, cleaning, laundry, interpersonal relationship, driving, shopping, community activity, occupation, yard work, and   difficulty with or unable to complete any activity that requires weight bearing, use of B LE, and/or balance including working, household and community mobility, walking, driving, going to family gatherings, avoiding incontinent episodes, playing with daughter, helping around the house, bed mobility, transfers  PERSONAL FACTORS: Past/current experiences, Time since onset of injury/illness/exacerbation, and 3+ comorbidities:   3 thoracic spine surgeries, thoracic MRI notes "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 Franky Macho on 05/25/2022, adhesive arachnoiditis, nerve pain are also affecting patient's functional outcome.   REHAB POTENTIAL: Fair due to severity and nature of condition.   CLINICAL DECISION MAKING: Evolving/moderate complexity  EVALUATION COMPLEXITY: Moderate   GOALS: Goals reviewed  with patient? No  SHORT TERM GOALS: Target date: 01/09/2023. Target date  updated to 05/28/2023 for all unmet goals on 03/05/2023.   Patient will be independent with initial home exercise program for self-management of symptoms. Baseline: Initial HEP to be provided at visit 2 as appropriate (12/26/22); Goal status: MET   LONG TERM GOALS: Target date: 03/20/2023  Patient will be independent with a long-term home exercise program for self-management of symptoms.  Baseline: Initial HEP to be provided at visit 2 as appropriate (12/26/22); participating as able (01/31/2023); patient currently participating as tolerated (03/05/2023; 04/09/2023);   Goal status: In-progress  2.  Patient will demonstrate improved FOTO by equal or greater than 10 points by visit #13 to demonstrate improvement in overall condition and self-reported functional ability.  Baseline: to be tested visit 2 as appropriate (12/26/22); 30 at visit #3 (01/02/2023); 46 at visit #13 (03/05/2023);  Goal status: MET  3.  Patient will demonstrate the ability to ambulate equal or greater than 600 feet with LRAD during the 6 minute walk to improve his household and community mobility.  Baseline: 34 feet with bari-RW (12/26/22); 200 feet with bari-RW (01/31/2023); 500 feet with BRW (03/05/2023); 400 feet with BRW (04/09/2023);  Goal status: In-progress  4.  Patient will complete 5 Time Sit To Stand Test from 19.5 inch surface or lower in equal or less than 15 seconds with no UE support to demonstrate improved transfer ability for improved household and toileting mobility.  Baseline: 23 seconds with heavy B UE support on RW from 19.5 inch plinth. Pain throbbing down the right LE.  (12/26/22); 19 seconds with heavy B UE support on RW from 19.5 inch plinth (03/05/2023); 20 seconds with heavy B UE support on RRW from 18.5 inch plinth. Painful in back of B calves, thighs, and lower back (04/09/2023);  Goal status: In-progress  5.  Patient will report  worst pain equal or less than 3/10 with functional activities to improve his ability to complete basic household and community mobility. Baseline: up to 8/10 (12/26/22); reports 3/10 pain (01/31/2023); reports 4/10 pain (02/19/2023); up to 7/10 in the last 2 week s(03/05/2023): up to  7/10 in the last two weeks (04/09/2023);  Goal status: In-progress   PLAN:  PT FREQUENCY: 1-2x/week  PT DURATION: 12 weeks  PLANNED INTERVENTIONS: Therapeutic exercises, Therapeutic activity, Neuromuscular re-education, Balance training, Gait training, Patient/Family education, Self Care, Joint mobilization, Stair training, Orthotic/Fit training, DME instructions, Aquatic Therapy, Dry Needling, Electrical stimulation, Wheelchair mobility training, Spinal mobilization, Cryotherapy, Moist heat, Manual therapy, and Re-evaluation.  PLAN FOR NEXT SESSION:  update HEP as appropriate, neurodynamics, gait training, LE/core/functional strengthening, stretching, and balance as tolerated, education, manual therapy as needed.  Cira Rue, PT, DPT 04/23/2023, 7:45 PM   [facility] [address] [place], [place] [address] P: [phone] I F: [phone]

## 2023-04-24 ENCOUNTER — Other Ambulatory Visit: Payer: Self-pay | Admitting: Physical Medicine & Rehabilitation

## 2023-04-25 ENCOUNTER — Ambulatory Visit: Payer: Medicare Other | Admitting: Physical Therapy

## 2023-04-30 ENCOUNTER — Encounter: Payer: Self-pay | Admitting: Physical Therapy

## 2023-04-30 ENCOUNTER — Ambulatory Visit: Payer: Medicare Other | Admitting: Physical Therapy

## 2023-04-30 DIAGNOSIS — M5416 Radiculopathy, lumbar region: Secondary | ICD-10-CM

## 2023-04-30 DIAGNOSIS — M5459 Other low back pain: Secondary | ICD-10-CM

## 2023-04-30 DIAGNOSIS — M6281 Muscle weakness (generalized): Secondary | ICD-10-CM

## 2023-04-30 DIAGNOSIS — R29818 Other symptoms and signs involving the nervous system: Secondary | ICD-10-CM

## 2023-04-30 DIAGNOSIS — R296 Repeated falls: Secondary | ICD-10-CM

## 2023-04-30 DIAGNOSIS — R262 Difficulty in walking, not elsewhere classified: Secondary | ICD-10-CM

## 2023-04-30 NOTE — Therapy (Signed)
OUTPATIENT PHYSICAL THERAPY TREATMENT  Patient Name: Travis Fitzpatrick MRN: [id number] DOB:03-05-1981, 42 y.o., male Today's Date: 04/30/23   END OF SESSION:  PT End of Session - 04/30/23 1824     Visit Number 23    Number of Visits 43    Date for PT Re-Evaluation 05/28/23    Authorization Type MEDICARE PART B reporting period from 04/09/2023    Progress Note Due on Visit 30    PT Start Time 1824    PT Stop Time 1905    PT Time Calculation (min) 41 min    Activity Tolerance Patient limited by pain;Patient limited by fatigue    Behavior During Therapy [facility] for tasks assessed/performed                Past Medical History:  Diagnosis Date   Bowel trouble    urgency   Medical history non-contributory    Urinary urgency    Past Surgical History:  Procedure Laterality Date   BACK SURGERY  2010   CIRCUMCISION     LUMBAR LAMINECTOMY/DECOMPRESSION MICRODISCECTOMY  07/20/2011   Procedure: LUMBAR LAMINECTOMY/DECOMPRESSION MICRODISCECTOMY;  Surgeon: Carmela Hurt;  Location: [facility];  Service: Neurosurgery;  Laterality: N/A;  right thoracotomy with thoracic eight-nine discectomy and fusion   LUMBAR LAMINECTOMY/DECOMPRESSION MICRODISCECTOMY Right 10/06/2021   Procedure: Right Lumbar Four-Five Microdiscectomy, Right Lumbar Five-Sacal One Foraminotomy;  Surgeon: Coletta Memos, MD;  Location: [facility];  Service: Neurosurgery;  Laterality: Right;  3C/RM 21   THORACIC DISCECTOMY  07/16/2012   Procedure: THORACIC DISCECTOMY;  Surgeon: Carmela Hurt, MD;  Location: [facility];  Service: Neurosurgery;  Laterality: Right;  RIGHT Thoracic seven-eight  thoracic diskectomy via thoracotomy by dr Laneta Simmers   THORACIC DISCECTOMY N/A 12/15/2014   Procedure: THORACIC SEVEN TO THORACIC NINE Laminectomy ;  Surgeon: Coletta Memos, MD;  Location: [facility];  Service: Neurosurgery;  Laterality: N/A;   THORACIC DISCECTOMY N/A 06/06/2017   Procedure: LAMINECTOMY THORACIC NINE-TEN;  Surgeon: Coletta Memos,  MD;  Location: [facility];  Service: Neurosurgery;  Laterality: N/A;  LAMINECTOMY THORACIC NINE-TEN   THORACOTOMY  07/20/2011   Procedure: THORACOTOMY OPEN FOR SPINE SURGERY;  Surgeon: Norton Blizzard, MD;  Location: [facility];  Service: Vascular;  Laterality: N/A;   THORACOTOMY  07/16/2012   Procedure: THORACOTOMY OPEN FOR SPINE SURGERY;  Surgeon: Alleen Borne, MD;  Location: [facility];  Service: Thoracic;  Laterality: N/A;   Patient Active Problem List   Diagnosis Date Noted   Nerve pain 12/05/2022   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   S/P lumbar spinal fusion 05/25/2022   Synovial cyst of lumbar facet joint 05/25/2022   HNP (herniated nucleus pulposus), lumbar 10/06/2021   Hand pain 06/28/2021   Lumbar facet arthropathy 01/11/2021   Thoracic spondylosis with myelopathy 01/11/2021   Thoracic spinal stenosis 06/06/2017   Stenosis, spinal, thoracic 12/15/2014   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 09/22/2014   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/2011    PCP: Nira Retort  REFERRING PROVIDER: Ranelle Oyster, MD   REFERRING DIAG: thoracic disc disease with myelopathy, nerve pain, adhesive arachnoiditis  Rationale for Evaluation and Treatment: Rehabilitation  THERAPY DIAG:  Other low back pain  Other symptoms and signs involving the nervous system  Radiculopathy, lumbar region  Muscle weakness (generalized)  Repeated falls  Difficulty in walking, not elsewhere classified  ONSET DATE:  Suddenly started having weakness 11 years ago, most recent episode of worsening October 2022, s/p  right L4-5 laminotomy, microdiscectomy on 10/06/2021, now s/p L4-5 PLIF by Dr. Franky Macho on 05/25/2022.  PERTINENT HISTORY:  Patient is a 42 y.o. male who presents to outpatient physical therapy with a referral for medical diagnosis thoracic disc disease with myelopathy, nerve pain, adhesive arachnoiditis. This patient's chief complaints consist of  disabling low back and R >L leg pain and weakness with activity, bowel and bladder urgency, incontinence, and retention, leading to the following functional deficits: difficulty with or unable to complete any activity that requires weight bearing, use of B LE, and/or balance including working, household and community mobility, walking, driving, going to family gatherings, avoiding incontinent episodes, playing with daughter, helping around the house, bed mobility, transfers. 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 Franky Macho on 05/25/2022, adhesive arachnoiditis, nerve pain.  Patient denies hx of cancer, stroke, seizures, lung problems, heart problems, diabetes, unexplained weight loss, and osteoporosis.  SUBJECTIVE:                                                                                                                                                                                           SUBJECTIVE STATEMENT:    Patient states he felt much worse after last PT session. He "couldn't walk on it and all his nerves were hurting." He had pain down back of both legs and the front of his right leg to the front of his shin and his right knee feels like pressure. He was crying. It gets a lot worse when he needs to have a BM. He states it feels likes "nerve pain on 1000." He called his doctor who increased his medications which he started Thursday afternoon. He could not get out of bed until about Saturday when it started calming down. When he is lying down he is trying to still move at least  one of his legs to stay active. He thought he was going to be late to today's appointment because when he tried to get up and walk he could not put pressure through his right leg without a sharp pain that would make his knees buckle.   PAIN:  NPRS: 4/10 low back back of both legs and the front of his right leg to the front of his shin and his right knee feels like pressure  PRECAUTIONS: Fall  PATIENT GOALS: "To get better, walk, drive again, climb stairs, play with my daughter, help around the house again"  NEXT MD VISIT: 03/13/2023 follow up with Dr. Riley Kill.   OBJECTIVE   TODAY'S TREATMENT:      Manual therapy: to reduce pain and tissue tension, improve range of motion, neuromodulation, in order to promote improved ability to complete functional activities. SUPINE - LAD through right hip with belt, 3x45 seconds, decreased R hip pain but had rebound R knee pain after letting pressure off following final pull.   Therapeutic exercise: to centralize symptoms and improve ROM, strength, muscular endurance, and activity tolerance required for successful completion of functional activities.  - ambulation over ground with BRW, 1x200 feet, seated rest, 1x60 feet. Close W/C follow for safety. Heavy B UE support. (increasing throbbing in R>L LE).  - seated TOTAL gym pallof press, 1x10 with anchor to left, 1x10, 1x5 with anchor to right. Level 10-8. CGA-minA to prevent falling off seated due to difficulty maintaining seated balance. Had difficulty holding feet up off the floor with anchor to right. Did not go back to 2nd set with anchor to left due to increasing pain overall.  - seated TOTAL gym rows straddling seat, 2x10 at level 10-8. Required cuing to keep elbows at sides for improved posture.  - attempted laying back on total gym for unloaded squats, but unable to get past straddle position due to increasing R LE pain and inability to achevie supine from sitting without log roll (too much pain).  -  transfers between positions and sit <> stand to get on and off total gym, PT providing min-modA at trunk and occasionally at feet for position changes and mod A at pelvis for sit to stand.  - ambulation with BRW and W/C follow approx 100 feet down ramp from clinic to vehicle. (W/c follow for safety).   Patient had severe R LE pain when performing car transfer and his wife helped him lift his right leg into the vehicle that helped relieve the pain.   Pt required multimodal cuing for proper technique and to facilitate improved neuromuscular control, strength, range of motion, and functional ability resulting in improved performance and form.  PATIENT EDUCATION:  Education details: Exercise purpose/form. Self management techniques.  Person educated: patient Education method: Explanation Education comprehension: verbalized understanding and needs further education  HOME EXERCISE PROGRAM: Verbally:  - seated lumbar flexion stretch with head down and one LE extended using rollator, 1x10 each side.  - curl up with legs elevated, 5x15 seconds - sidelying open book 1x10 each side  ASSESSMENT:  CLINICAL IMPRESSION:    Patient arrives with report of significant worsening of pain and function after last PT session but with gradual improvement with increased medications from doctor and rest. Patient eager to push himself today and required some cuing to hold back when pushing could further exacerbate pain. Extension positions abandoned today due to poor outcome last visit, and attempted more seated positions to improve tolerance. Core exercises explored using the Total Gym with patient struggling to maintain upright balance as well as unable to sit up with tall posture due to intolerable pain when trying to do so during anti-rotation exercises. Patient was limited by increased nerve pain in the R > L LE that had increased irritation after Total Gym exercises when patient initially went to ambulate, but  seemed to ease off by the time he got to the vehicle. Patient was noted to have right shoulder significantly  higher than left and a right lateral shift in posture. He also had difficulty extending his knees to keep his feet off the floor during Total Gym exercises. Overall, patient seemed to tolerate today's session better than last. He did report decreased R hip pain with LAD that he was better able to tolerate today. Plan to continue with core, LE, and functional strengthening as tolerated. Patient would benefit from continued management of limiting condition by skilled physical therapist to address remaining impairments and functional limitations to work towards stated goals and return to PLOF or maximal functional independence.   OBJECTIVE IMPAIRMENTS: Abnormal gait, decreased activity tolerance, decreased balance, decreased coordination, decreased endurance, decreased knowledge of condition, decreased knowledge of use of DME, decreased mobility, difficulty walking, decreased ROM, decreased strength, increased edema, impaired perceived functional ability, increased muscle spasms, impaired flexibility, impaired sensation, impaired tone, improper body mechanics, postural dysfunction, obesity, and pain.   ACTIVITY LIMITATIONS: carrying, lifting, bending, standing, squatting, stairs, transfers, bed mobility, continence, bathing, toileting, dressing, hygiene/grooming, locomotion level, and caring for others  PARTICIPATION LIMITATIONS: meal prep, cleaning, laundry, interpersonal relationship, driving, shopping, community activity, occupation, yard work, and   difficulty with or unable to complete any activity that requires weight bearing, use of B LE, and/or balance including working, household and community mobility, walking, driving, going to family gatherings, avoiding incontinent episodes, playing with daughter, helping around the house, bed mobility, transfers  PERSONAL FACTORS: Past/current experiences,  Time since onset of injury/illness/exacerbation, and 3+ comorbidities:   3 thoracic spine surgeries, thoracic MRI notes "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 Franky Macho on 05/25/2022, adhesive arachnoiditis, nerve pain are also affecting patient's functional outcome.   REHAB POTENTIAL: Fair due to severity and nature of condition.   CLINICAL DECISION MAKING: Evolving/moderate complexity  EVALUATION COMPLEXITY: Moderate   GOALS: Goals reviewed with patient? No  SHORT TERM GOALS: Target date: 01/09/2023. Target date updated to 05/28/2023 for all unmet goals on 03/05/2023.   Patient will be independent with initial home exercise program for self-management of symptoms. Baseline: Initial HEP to be provided at visit 2 as appropriate (12/26/22); Goal status: MET   LONG TERM GOALS: Target date: 03/20/2023  Patient will be independent with a long-term home exercise program for self-management of symptoms.  Baseline: Initial HEP to be provided at visit 2 as appropriate (12/26/22); participating as able (01/31/2023); patient currently participating as tolerated (03/05/2023; 04/09/2023);   Goal status: In-progress  2.  Patient will demonstrate improved FOTO by equal or greater than 10 points by visit #13 to demonstrate improvement in overall condition and self-reported functional ability.  Baseline: to be tested visit 2 as appropriate (12/26/22); 30 at visit #3 (01/02/2023); 46 at visit #13 (03/05/2023);  Goal status: MET  3.  Patient will demonstrate the ability to ambulate equal or greater than 600 feet with LRAD during the 6 minute walk to improve his household and community mobility.   Baseline: 34 feet with bari-RW (12/26/22); 200 feet with bari-RW (01/31/2023); 500 feet with BRW (03/05/2023); 400 feet with BRW (04/09/2023);  Goal status: In-progress  4.  Patient will complete 5 Time Sit To Stand Test from 19.5 inch surface or lower in equal or less than 15 seconds with no UE support  to demonstrate improved transfer ability for improved household and toileting mobility.  Baseline: 23 seconds with heavy B UE support on RW from 19.5 inch plinth. Pain throbbing down the right LE.  (12/26/22); 19 seconds with heavy B UE support on RW from 19.5 inch plinth (03/05/2023); 20 seconds with heavy B UE support on RRW from 18.5 inch plinth. Painful in back of B calves, thighs, and lower back (04/09/2023);  Goal status: In-progress  5.  Patient will report worst pain equal or less than 3/10 with functional activities to improve his ability to complete basic household and community mobility. Baseline: up to 8/10 (12/26/22); reports 3/10 pain (01/31/2023); reports 4/10 pain (02/19/2023); up to 7/10 in the last 2 week s(03/05/2023): up to  7/10 in the last two weeks (04/09/2023);  Goal status: In-progress   PLAN:  PT FREQUENCY: 1-2x/week  PT DURATION: 12 weeks  PLANNED INTERVENTIONS: Therapeutic exercises, Therapeutic activity, Neuromuscular re-education, Balance training, Gait training, Patient/Family education, Self Care, Joint mobilization, Stair training, Orthotic/Fit training, DME instructions, Aquatic Therapy, Dry Needling, Electrical stimulation, Wheelchair mobility training, Spinal mobilization, Cryotherapy, Moist heat, Manual therapy, and Re-evaluation.  PLAN FOR NEXT SESSION:  update HEP as appropriate, neurodynamics, gait training, LE/core/functional strengthening, stretching, and balance as tolerated, education, manual therapy as needed.  Cira Rue, PT, DPT 04/30/2023, 7:36 PM   [facility] [address] [place], [place] [address] P:  [phone] I F: [phone]

## 2023-05-02 ENCOUNTER — Encounter: Payer: Self-pay | Admitting: Physical Therapy

## 2023-05-02 ENCOUNTER — Ambulatory Visit: Payer: Medicare Other | Admitting: Physical Therapy

## 2023-05-02 VITALS — BP 129/92 | HR 109

## 2023-05-02 DIAGNOSIS — R29818 Other symptoms and signs involving the nervous system: Secondary | ICD-10-CM

## 2023-05-02 DIAGNOSIS — M5416 Radiculopathy, lumbar region: Secondary | ICD-10-CM

## 2023-05-02 DIAGNOSIS — R296 Repeated falls: Secondary | ICD-10-CM

## 2023-05-02 DIAGNOSIS — M6281 Muscle weakness (generalized): Secondary | ICD-10-CM

## 2023-05-02 DIAGNOSIS — M5459 Other low back pain: Secondary | ICD-10-CM | POA: Diagnosis not present

## 2023-05-02 DIAGNOSIS — R262 Difficulty in walking, not elsewhere classified: Secondary | ICD-10-CM

## 2023-05-02 NOTE — Therapy (Signed)
OUTPATIENT PHYSICAL THERAPY TREATMENT  Patient Name: Travis Fitzpatrick MRN: [id number] DOB:09/08/1980, 42 y.o., male Today's Date: 05/02/23   END OF SESSION:  PT End of Session - 05/02/23 1827     Visit Number 24    Number of Visits 43    Date for PT Re-Evaluation 05/28/23    Authorization Type MEDICARE PART B reporting period from 04/09/2023    Progress Note Due on Visit 30    PT Start Time 1827   pt running late   PT Stop Time 1910    PT Time Calculation (min) 43 min    Activity Tolerance Patient limited by pain;Patient limited by fatigue    Behavior During Therapy [facility] for tasks assessed/performed                 Past Medical History:  Diagnosis Date   Bowel trouble    urgency   Medical history non-contributory    Urinary urgency    Past Surgical History:  Procedure Laterality Date   BACK SURGERY  2010   CIRCUMCISION     LUMBAR LAMINECTOMY/DECOMPRESSION MICRODISCECTOMY  07/20/2011   Procedure: LUMBAR LAMINECTOMY/DECOMPRESSION MICRODISCECTOMY;  Surgeon: Carmela Hurt;  Location: [facility];  Service: Neurosurgery;  Laterality: N/A;  right thoracotomy with thoracic eight-nine discectomy and fusion   LUMBAR LAMINECTOMY/DECOMPRESSION MICRODISCECTOMY Right 10/06/2021   Procedure: Right Lumbar Four-Five Microdiscectomy, Right Lumbar Five-Sacal One Foraminotomy;  Surgeon: Coletta Memos, MD;  Location: [facility];  Service: Neurosurgery;  Laterality: Right;  3C/RM 21   THORACIC DISCECTOMY  07/16/2012   Procedure: THORACIC DISCECTOMY;  Surgeon: Carmela Hurt, MD;  Location: [facility];  Service: Neurosurgery;  Laterality: Right;  RIGHT Thoracic seven-eight  thoracic diskectomy via thoracotomy by dr Laneta Simmers   THORACIC DISCECTOMY N/A 12/15/2014   Procedure: THORACIC SEVEN TO THORACIC NINE Laminectomy ;  Surgeon: Coletta Memos, MD;  Location: [facility];  Service: Neurosurgery;  Laterality: N/A;   THORACIC DISCECTOMY N/A 06/06/2017   Procedure: LAMINECTOMY THORACIC NINE-TEN;   Surgeon: Coletta Memos, MD;  Location: [facility];  Service: Neurosurgery;  Laterality: N/A;  LAMINECTOMY THORACIC NINE-TEN   THORACOTOMY  07/20/2011   Procedure: THORACOTOMY OPEN FOR SPINE SURGERY;  Surgeon: Norton Blizzard, MD;  Location: [facility];  Service: Vascular;  Laterality: N/A;   THORACOTOMY  07/16/2012   Procedure: THORACOTOMY OPEN FOR SPINE SURGERY;  Surgeon: Alleen Borne, MD;  Location: [facility];  Service: Thoracic;  Laterality: N/A;   Patient Active Problem List   Diagnosis Date Noted   Nerve pain 12/05/2022   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   S/P lumbar spinal fusion 05/25/2022   Synovial cyst of lumbar facet joint 05/25/2022   HNP (herniated nucleus pulposus), lumbar 10/06/2021   Hand pain 06/28/2021   Lumbar facet arthropathy 01/11/2021   Thoracic spondylosis with myelopathy 01/11/2021   Thoracic spinal stenosis 06/06/2017   Stenosis, spinal, thoracic 12/15/2014   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 09/22/2014   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/2011    PCP: Nira Retort  REFERRING PROVIDER: Ranelle Oyster, MD   REFERRING DIAG: thoracic disc disease with myelopathy, nerve pain, adhesive arachnoiditis  Rationale for Evaluation and Treatment: Rehabilitation  THERAPY DIAG:  Other low back pain  Other symptoms and signs involving the nervous system  Radiculopathy, lumbar region  Muscle weakness (generalized)  Repeated falls  Difficulty in walking, not elsewhere classified  ONSET DATE:  Suddenly started having weakness 11 years ago, most recent episode  of worsening October 2022, s/p right L4-5 laminotomy, microdiscectomy on 10/06/2021, now s/p L4-5 PLIF by Dr. Franky Macho on 05/25/2022.  PERTINENT HISTORY:  Patient is a 42 y.o. male who presents to outpatient physical therapy with a referral for medical diagnosis thoracic disc disease with myelopathy, nerve pain, adhesive arachnoiditis. This patient's chief  complaints consist of disabling low back and R >L leg pain and weakness with activity, bowel and bladder urgency, incontinence, and retention, leading to the following functional deficits: difficulty with or unable to complete any activity that requires weight bearing, use of B LE, and/or balance including working, household and community mobility, walking, driving, going to family gatherings, avoiding incontinent episodes, playing with daughter, helping around the house, bed mobility, transfers. 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 Franky Macho on 05/25/2022, adhesive arachnoiditis, nerve pain.  Patient denies hx of cancer, stroke, seizures, lung problems, heart problems, diabetes, unexplained weight loss, and osteoporosis.  SUBJECTIVE:                                                                                                                                                                                           SUBJECTIVE STATEMENT:    Patient states he is hurting more than usual but is here. He is having more intermittent sharp pains in his lower leg and ankle. It feels like pins and needles. He had to take his sock off last night which relieved the feeling. He usually is completely numb in his lower legs. He states his wife said there was dark marks along his legs that were tender to touch when he was really hurting last week. He did not tell the doctor that.   PAIN:  NPRS: 4/10 low back, the back of both legs and the front of his right leg to the front of his shin and his right  knee feels like pressure. The R hip groin area hurts too.   PRECAUTIONS: Fall  PATIENT GOALS: "To get better, walk, drive again, climb stairs, play with my daughter, help around the house again"  NEXT MD VISIT: 03/13/2023 follow up with Dr. Riley Kill.   OBJECTIVE   Vitals:   05/02/23 1906  BP: (!) 129/92  Pulse: (!) 109  SpO2: 99%   TODAY'S TREATMENT:  Manual therapy: to reduce pain and tissue tension, improve range of motion, neuromodulation, in order to promote improved ability to complete functional activities. SUPINE - LAD through right hip with belt, 2x45-60 seconds, decreased R hip pain but had rebound R knee pain after letting pressure off following final pull.   Therapeutic exercise: to centralize symptoms and improve ROM, strength, muscular endurance, and activity tolerance required for successful completion of functional activities.  - ambulation over ground with BRW, 1x200 feet, seated rest, 1x60 feet. Close W/C follow for safety. Heavy B UE support. (increasing throbbing in R>L LE).  - seated TOTAL gym pallof press, 3x10 each side, level 10-11. Required left lean with anchor to the right to prevent irritation of right sided pain. SBA-minA to prevent falling off of platform.  - seated TOTAL gym rows straddling seat, 3x10 at level 11-12. Required cuing to keep elbows at sides for improved posture. Able to tolerate with neck flexed slightly. Last set was particularly painful, but patient modified position and continued.  - transfers between positions and sit <> stand to get on and off total gym, PT providing CGA-min at trunk and occasionally at feet for position changes.  - vitals check when patient became light headed and nauseated.  - ambulation with BRW and W/C follow approx 100 feet down ramp from clinic to vehicle. (W/c follow for safety).   Patient had severe R LE pain when performing car transfer and his wife helped him lift his right leg into the vehicle that helped  relieve the pain.   Pt required multimodal cuing for proper technique and to facilitate improved neuromuscular control, strength, range of motion, and functional ability resulting in improved performance and form.  PATIENT EDUCATION:  Education details: Exercise purpose/form. Self management techniques.  Person educated: patient Education method: Explanation Education comprehension: verbalized understanding and needs further education  HOME EXERCISE PROGRAM: Verbally:  - seated lumbar flexion stretch with head down and one LE extended using rollator, 1x10 each side.  - curl up with legs elevated, 5x15 seconds - sidelying open book 1x10 each side  ASSESSMENT:  CLINICAL IMPRESSION:    Patient arrives with report of increased pain after last PT session and continued pain today but motivated to continue with PT and pain better than last week. Today's session focused on similar interventions, progressed as tolerated, and avoided exercises that were unsuccessful last session. Patient demonstrated improved trunk control and leg strength/control today compared to last PT session and was able to complete more reps and change positions more frequently than at last PT session. However, after getting up from the Total Gym, patient suddenly felt light headed and developed nausea. Blood pressure was 129/92 mmHg and HR was 109 bpm, but patient has a history of elevated heart rate. He reports having episodes like these occasionally and his doctor has related them to his condition. Patient was provided a cold pack, cool rag, and ice on his wrists while he rested sitting in the wheelchair used to follow him while ambulating. He eventually felt better and was able to ambulate back to the vehicle with a w/c follow. His wife was updated on the occurrence and she recognized it as something that has been happening to him. Patient did report he had a sharp increase in pain when he tried to push through the last set of  rows. Plan to titrate exercise to attempt to avoid this spike in pain and response next visit. Patient would benefit from continued management of limiting condition  by skilled physical therapist to address remaining impairments and functional limitations to work towards stated goals and return to PLOF or maximal functional independence.    OBJECTIVE IMPAIRMENTS: Abnormal gait, decreased activity tolerance, decreased balance, decreased coordination, decreased endurance, decreased knowledge of condition, decreased knowledge of use of DME, decreased mobility, difficulty walking, decreased ROM, decreased strength, increased edema, impaired perceived functional ability, increased muscle spasms, impaired flexibility, impaired sensation, impaired tone, improper body mechanics, postural dysfunction, obesity, and pain.   ACTIVITY LIMITATIONS: carrying, lifting, bending, standing, squatting, stairs, transfers, bed mobility, continence, bathing, toileting, dressing, hygiene/grooming, locomotion level, and caring for others  PARTICIPATION LIMITATIONS: meal prep, cleaning, laundry, interpersonal relationship, driving, shopping, community activity, occupation, yard work, and   difficulty with or unable to complete any activity that requires weight bearing, use of B LE, and/or balance including working, household and community mobility, walking, driving, going to family gatherings, avoiding incontinent episodes, playing with daughter, helping around the house, bed mobility, transfers  PERSONAL FACTORS: Past/current experiences, Time since onset of injury/illness/exacerbation, and 3+ comorbidities:   3 thoracic spine surgeries, thoracic MRI notes "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 Franky Macho on 05/25/2022, adhesive arachnoiditis, nerve pain are also affecting patient's functional outcome.   REHAB POTENTIAL: Fair due to severity and nature of condition.   CLINICAL DECISION MAKING: Evolving/moderate complexity  EVALUATION COMPLEXITY: Moderate   GOALS: Goals reviewed with patient? No  SHORT TERM GOALS: Target date: 01/09/2023. Target date updated to 05/28/2023 for all unmet goals on 03/05/2023.   Patient will be independent with initial home exercise program for self-management of symptoms. Baseline: Initial HEP to be provided at visit 2 as appropriate (12/26/22); Goal status: MET   LONG TERM GOALS: Target date: 03/20/2023  Patient will be independent with a long-term home exercise program for self-management of symptoms.  Baseline: Initial HEP to be provided at visit 2 as appropriate (12/26/22); participating as able (01/31/2023); patient currently participating as tolerated (03/05/2023; 04/09/2023);   Goal status: In-progress  2.  Patient will demonstrate improved FOTO by equal or greater than 10 points by visit #13 to demonstrate improvement in overall condition and self-reported functional ability.  Baseline: to be tested visit 2 as appropriate (12/26/22); 30 at visit #3 (01/02/2023); 46 at visit #13 (03/05/2023);  Goal status: MET  3.  Patient will demonstrate the ability to ambulate equal or greater than 600 feet with LRAD during the 6 minute walk to improve his household and community mobility.  Baseline: 34 feet with bari-RW (12/26/22); 200 feet with bari-RW (01/31/2023); 500 feet with BRW (03/05/2023); 400 feet with BRW (04/09/2023);  Goal status: In-progress  4.  Patient will complete 5 Time Sit To Stand Test from 19.5 inch surface or lower in equal or less than 15 seconds with no UE support to demonstrate improved transfer ability for improved household and toileting mobility.   Baseline: 23 seconds with heavy B UE support on RW from 19.5 inch plinth. Pain throbbing down the right LE.  (12/26/22); 19 seconds with heavy B UE support on RW from 19.5 inch plinth (03/05/2023); 20 seconds with heavy B UE support on RRW from 18.5 inch plinth. Painful in back of B calves, thighs, and lower back (04/09/2023);  Goal  status: In-progress  5.  Patient will report worst pain equal or less than 3/10 with functional activities to improve his ability to complete basic household and community mobility. Baseline: up to 8/10 (12/26/22); reports 3/10 pain (01/31/2023); reports 4/10 pain (02/19/2023); up to 7/10 in the last 2 week s(03/05/2023): up to  7/10 in the last two weeks (04/09/2023);  Goal status: In-progress   PLAN:  PT FREQUENCY: 1-2x/week  PT DURATION: 12 weeks  PLANNED INTERVENTIONS: Therapeutic exercises, Therapeutic activity, Neuromuscular re-education, Balance training, Gait training, Patient/Family education, Self Care, Joint mobilization, Stair training, Orthotic/Fit training, DME instructions, Aquatic Therapy, Dry Needling, Electrical stimulation, Wheelchair mobility training, Spinal mobilization, Cryotherapy, Moist heat, Manual therapy, and Re-evaluation.  PLAN FOR NEXT SESSION:  update HEP as appropriate, neurodynamics, gait training, LE/core/functional strengthening, stretching, and balance as tolerated, education, manual therapy as needed.  Cira Rue, PT, DPT 05/02/2023, 7:51 PM   [facility] [address] [place], [place] [address] P: [phone] I F: [phone]

## 2023-05-08 ENCOUNTER — Ambulatory Visit: Payer: Medicare Other | Admitting: Physical Therapy

## 2023-05-13 ENCOUNTER — Encounter: Payer: Self-pay | Admitting: Physical Therapy

## 2023-05-13 ENCOUNTER — Ambulatory Visit: Payer: Medicare Other | Admitting: Physical Therapy

## 2023-05-13 DIAGNOSIS — M5416 Radiculopathy, lumbar region: Secondary | ICD-10-CM

## 2023-05-13 DIAGNOSIS — M5459 Other low back pain: Secondary | ICD-10-CM | POA: Diagnosis not present

## 2023-05-13 DIAGNOSIS — R296 Repeated falls: Secondary | ICD-10-CM

## 2023-05-13 DIAGNOSIS — M6281 Muscle weakness (generalized): Secondary | ICD-10-CM

## 2023-05-13 DIAGNOSIS — R29818 Other symptoms and signs involving the nervous system: Secondary | ICD-10-CM

## 2023-05-13 DIAGNOSIS — R262 Difficulty in walking, not elsewhere classified: Secondary | ICD-10-CM

## 2023-05-13 NOTE — Therapy (Signed)
OUTPATIENT PHYSICAL THERAPY TREATMENT  Patient Name: Travis Fitzpatrick MRN: [id number] DOB:[birth year], 42 y.o., male Today's Date: 05/13/23   END OF SESSION:  PT End of Session - 05/13/23 1823     Visit Number 25    Number of Visits 43    Date for PT Re-Evaluation 05/28/23    Authorization Type MEDICARE PART B reporting period from 04/09/2023    Progress Note Due on Visit 30    PT Start Time 1823    PT Stop Time 1901    PT Time Calculation (min) 38 min    Activity Tolerance Patient limited by pain;Patient limited by fatigue    Behavior During Therapy [facility] for tasks assessed/performed                  Past Medical History:  Diagnosis Date   Bowel trouble    urgency   Medical history non-contributory    Urinary urgency    Past Surgical History:  Procedure Laterality Date   BACK SURGERY  2010   CIRCUMCISION     LUMBAR LAMINECTOMY/DECOMPRESSION MICRODISCECTOMY  07/20/2011   Procedure: LUMBAR LAMINECTOMY/DECOMPRESSION MICRODISCECTOMY;  Surgeon: Carmela Hurt;  Location: [facility];  Service: Neurosurgery;  Laterality: N/A;  right thoracotomy with thoracic eight-nine discectomy and fusion   LUMBAR LAMINECTOMY/DECOMPRESSION MICRODISCECTOMY Right 10/06/2021   Procedure: Right Lumbar Four-Five Microdiscectomy, Right Lumbar Five-Sacal One Foraminotomy;  Surgeon: Coletta Memos, MD;  Location: [facility];  Service: Neurosurgery;  Laterality: Right;  3C/RM 21   THORACIC DISCECTOMY  07/16/2012   Procedure: THORACIC DISCECTOMY;  Surgeon: Carmela Hurt, MD;  Location: [facility];  Service: Neurosurgery;  Laterality: Right;  RIGHT Thoracic seven-eight  thoracic diskectomy via thoracotomy by dr Laneta Simmers   THORACIC DISCECTOMY N/A 12/15/2014   Procedure: THORACIC SEVEN TO THORACIC NINE Laminectomy ;  Surgeon: Coletta Memos, MD;  Location: [facility];  Service: Neurosurgery;  Laterality: N/A;   THORACIC DISCECTOMY N/A 06/06/2017   Procedure: LAMINECTOMY THORACIC NINE-TEN;  Surgeon: Coletta Memos, MD;  Location: [facility];  Service: Neurosurgery;  Laterality: N/A;  LAMINECTOMY THORACIC NINE-TEN   THORACOTOMY  07/20/2011   Procedure: THORACOTOMY OPEN FOR SPINE SURGERY;  Surgeon: Norton Blizzard, MD;  Location: [facility];  Service: Vascular;  Laterality: N/A;   THORACOTOMY  07/16/2012   Procedure: THORACOTOMY OPEN FOR SPINE SURGERY;  Surgeon: Alleen Borne, MD;  Location: [facility];  Service: Thoracic;  Laterality: N/A;   Patient Active Problem List   Diagnosis Date Noted   Nerve pain 12/05/2022   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   S/P lumbar spinal fusion 05/25/2022   Synovial cyst of lumbar facet joint 05/25/2022   HNP (herniated nucleus pulposus), lumbar 10/06/2021   Hand pain 06/28/2021   Lumbar facet arthropathy 01/11/2021   Thoracic spondylosis with myelopathy 01/11/2021   Thoracic spinal stenosis 06/06/2017   Stenosis, spinal, thoracic 12/15/2014   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 09/22/2014   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/2011    PCP: Nira Retort  REFERRING PROVIDER: Ranelle Oyster, MD   REFERRING DIAG: thoracic disc disease with myelopathy, nerve pain, adhesive arachnoiditis  Rationale for Evaluation and Treatment: Rehabilitation  THERAPY DIAG:  Other low back pain  Other symptoms and signs involving the nervous system  Radiculopathy, lumbar region  Muscle weakness (generalized)  Repeated falls  Difficulty in walking, not elsewhere classified  ONSET DATE:  Suddenly started having weakness 11 years ago, most recent episode of worsening October  2022, s/p right L4-5 laminotomy, microdiscectomy on 10/06/2021, now s/p L4-5 PLIF by Dr. Franky Macho on 05/25/2022.  PERTINENT HISTORY:  Patient is a 42 y.o. male who presents to outpatient physical therapy with a referral for medical diagnosis thoracic disc disease with myelopathy, nerve pain, adhesive arachnoiditis. This patient's chief complaints consist  of disabling low back and R >L leg pain and weakness with activity, bowel and bladder urgency, incontinence, and retention, leading to the following functional deficits: difficulty with or unable to complete any activity that requires weight bearing, use of B LE, and/or balance including working, household and community mobility, walking, driving, going to family gatherings, avoiding incontinent episodes, playing with daughter, helping around the house, bed mobility, transfers. 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 Franky Macho on 05/25/2022, adhesive arachnoiditis, nerve pain.  Patient denies hx of cancer, stroke, seizures, lung problems, heart problems, diabetes, unexplained weight loss, and osteoporosis.  SUBJECTIVE:                                                                                                                                                                                           SUBJECTIVE STATEMENT:    Patient  arrives with BRW. He states that after last PT as soon as he got home he got light headed and nauseated. He had a migraine with sensitivity to the eyes and ears that night. The same symptoms he had when he was last hospitalized except the fever. He checked his BP and it was about 129/42mmHg. He could not put pressure on his right LE to ambulate once he go home. He was in the bed for about a week and missed his PT appointments last week. He started feeling better Friday last week, where it started to feel like just a migraine. On Saturday when he got up he felt  like "everything had left." He has been feeling relatively better since Saturday, back to baseline. He did not tell his doctor about the episode but plans to tell him everything when he sees him on Wednesday this week. He has been doing mostly bed HEP at home. He has been working on sit <> stand at home as well. He has not heard anything recently about his wheelchair.   PAIN:  NPRS: 3/10 low back, right  hip, back of both legs to ankles (R > L).    PRECAUTIONS: Fall  PATIENT GOALS: "To get better, walk, drive again, climb stairs, play with my daughter, help around the house again"  NEXT MD VISIT: 03/13/2023 follow up with Dr. Riley Kill.   OBJECTIVE  TODAY'S TREATMENT:      Manual therapy: to reduce pain and tissue tension, improve range of motion, neuromodulation, in order to promote improved ability to complete functional activities. SUPINE - LAD through right hip with belt, 3x45 seconds, decreased R hip pain but difficulty with right knee pain when going supine to sit afterwards.   Therapeutic exercise: to centralize symptoms and improve ROM, strength, muscular endurance, and activity tolerance required for successful completion of functional activities.  - ambulation over ground with BRW, 1x200 feet, 2-4 times patient caught one foot or the other on the floor and required heavy B UE support to prevent fall. He states he gets a sudden shock from his right hip to his low back that causes his leg to stop.  - sit <> stand from 22.5 inch plinth, 3x10 with attempts to use minimal UE support on RW.  - seated pallof press with single strand SilverTB held by PT, 3x10 each side.  - seated rows with SilverTB held by PT, 3x10 - ambulation with BRW and SBA approx 100 feet down ramp from clinic to vehicle.  Pt required multimodal cuing for proper technique and to facilitate improved neuromuscular control, strength, range of motion, and functional ability resulting in improved performance and  form.  PATIENT EDUCATION:  Education details: Exercise purpose/form. Self management techniques.  Person educated: patient Education method: Explanation Education comprehension: verbalized understanding and needs further education  HOME EXERCISE PROGRAM: Verbally:  - seated lumbar flexion stretch with head down and one LE extended using rollator, 1x10 each side.  - curl up with legs elevated, 5x15 seconds - sidelying open book 1x10 each side  ASSESSMENT:  CLINICAL IMPRESSION:    Patient arrives reporting severe worsening of symptoms following last PT session that kept him in bed for a week and prevented him from coming to PT last week. He had constitutional symptoms including nausea, lightheadedness, migraine, photophobia, and phonophobia, as well as severe right > left LE pain when trying to bear weight. He arrives today feeling closer to recent baseline, which is pain in the low back and down both legs, with right sided hip/groin pain that worsens along with the right sided nerve pain down his leg with weight bearing. Today's session regressed exercises to much easier core exercises and mobility exercises, and patient tolerated it relatively well. He was still limited in walking and mobility due to increasing symptoms. He got some partial relief from hip pain with LAD through the right hip, but continued to have sharp reactionary pain in the right knee that stopped him while trying to transfer supine to sit. Overall, patient appears to be having a good day today, but is struggling to make progress in PT or tolerate PT interventions in a consistent manner. Patient's condition is known to be chronic, exhibit labile symptom behavior, and severely impair mobility. Physical therapy to help preserve and maximize mobility continues to be indicated for patient's condition. Patient would benefit from continued management of limiting condition by skilled physical therapist to address remaining impairments  and functional limitations to work towards stated goals and return to PLOF or maximal functional independence.   OBJECTIVE IMPAIRMENTS: Abnormal gait, decreased activity tolerance, decreased balance, decreased coordination, decreased  endurance, decreased knowledge of condition, decreased knowledge of use of DME, decreased mobility, difficulty walking, decreased ROM, decreased strength, increased edema, impaired perceived functional ability, increased muscle spasms, impaired flexibility, impaired sensation, impaired tone, improper body mechanics, postural dysfunction, obesity, and pain.   ACTIVITY LIMITATIONS: carrying, lifting, bending, standing, squatting, stairs, transfers, bed mobility, continence, bathing, toileting, dressing, hygiene/grooming, locomotion level, and caring for others  PARTICIPATION LIMITATIONS: meal prep, cleaning, laundry, interpersonal relationship, driving, shopping, community activity, occupation, yard work, and   difficulty with or unable to complete any activity that requires weight bearing, use of B LE, and/or balance including working, household and community mobility, walking, driving, going to family gatherings, avoiding incontinent episodes, playing with daughter, helping around the house, bed mobility, transfers  PERSONAL FACTORS: Past/current experiences, Time since onset of injury/illness/exacerbation, and 3+ comorbidities:   3 thoracic spine surgeries, thoracic MRI notes "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 Franky Macho on 05/25/2022, adhesive arachnoiditis, nerve pain are also affecting patient's functional outcome.   REHAB POTENTIAL: Fair due to  severity and nature of condition.   CLINICAL DECISION MAKING: Evolving/moderate complexity  EVALUATION COMPLEXITY: Moderate   GOALS: Goals reviewed with patient? No  SHORT TERM GOALS: Target date: 01/09/2023. Target date updated to 05/28/2023 for all unmet goals on 03/05/2023.   Patient will be independent with initial home exercise program for self-management of symptoms. Baseline: Initial HEP to be provided at visit 2 as appropriate (12/26/22); Goal status: MET   LONG TERM GOALS: Target date: 03/20/2023  Patient will be independent with a long-term home exercise program for self-management of symptoms.  Baseline: Initial HEP to be provided at visit 2 as appropriate (12/26/22); participating as able (01/31/2023); patient currently participating as tolerated (03/05/2023; 04/09/2023);   Goal status: In-progress  2.  Patient will demonstrate improved FOTO by equal or greater than 10 points by visit #13 to demonstrate improvement in overall condition and self-reported functional ability.  Baseline: to be tested visit 2 as appropriate (12/26/22); 30 at visit #3 (01/02/2023); 46 at visit #13 (03/05/2023);  Goal status: MET  3.  Patient will demonstrate the ability to ambulate equal or greater than 600 feet with LRAD during the 6 minute walk to improve his household and community mobility.  Baseline: 34 feet with bari-RW (12/26/22); 200 feet with bari-RW (01/31/2023); 500 feet with BRW (03/05/2023); 400 feet with BRW (04/09/2023);  Goal status: In-progress  4.  Patient will complete 5 Time Sit To Stand Test from 19.5 inch surface or lower in equal or less than 15 seconds with no UE support to demonstrate improved transfer ability for improved household and toileting mobility.  Baseline: 23 seconds with heavy B UE support on RW from 19.5 inch plinth. Pain throbbing down the right LE.  (12/26/22); 19 seconds with heavy B UE support on RW from 19.5 inch plinth (03/05/2023); 20 seconds with heavy B UE  support on RRW from 18.5 inch plinth. Painful in back of B calves, thighs, and lower back (04/09/2023);  Goal status: In-progress  5.  Patient will report worst pain equal or less than 3/10 with functional activities to improve his ability to complete basic household and community mobility. Baseline: up to 8/10 (12/26/22); reports 3/10 pain (01/31/2023); reports  4/10 pain (02/19/2023); up to 7/10 in the last 2 week s(03/05/2023): up to  7/10 in the last two weeks (04/09/2023);  Goal status: In-progress   PLAN:  PT FREQUENCY: 1-2x/week  PT DURATION: 12 weeks  PLANNED INTERVENTIONS: Therapeutic exercises, Therapeutic activity, Neuromuscular re-education, Balance training, Gait training, Patient/Family education, Self Care, Joint mobilization, Stair training, Orthotic/Fit training, DME instructions, Aquatic Therapy, Dry Needling, Electrical stimulation, Wheelchair mobility training, Spinal mobilization, Cryotherapy, Moist heat, Manual therapy, and Re-evaluation.  PLAN FOR NEXT SESSION:  update HEP as appropriate, neurodynamics, gait training, LE/core/functional strengthening, stretching, and balance as tolerated, education, manual therapy as needed.  Cira Rue, PT, DPT 05/13/2023, 8:15 PM   [facility] [address] [place], [place] [address] P: [phone] I F: [phone]

## 2023-05-14 NOTE — Therapy (Signed)
OUTPATIENT PHYSICAL THERAPY TREATMENT  Patient Name: Travis Fitzpatrick MRN: [id number] DOB:[birth year], 42 y.o., male Today's Date: 05/15/23   END OF SESSION:  PT End of Session - 05/15/23 1815     Visit Number 26    Number of Visits 43    Date for PT Re-Evaluation 05/28/23    Authorization Type MEDICARE PART B reporting period from 04/09/2023    Progress Note Due on Visit 30    PT Start Time 1615    PT Stop Time 1658    PT Time Calculation (min) 43 min    Activity Tolerance Patient limited by pain;Patient limited by fatigue    Behavior During Therapy [facility] for tasks assessed/performed                   Past Medical History:  Diagnosis Date   Bowel trouble    urgency   Medical history non-contributory    Urinary urgency    Past Surgical History:  Procedure Laterality Date   BACK SURGERY  2010   CIRCUMCISION     LUMBAR LAMINECTOMY/DECOMPRESSION MICRODISCECTOMY  07/20/2011   Procedure: LUMBAR LAMINECTOMY/DECOMPRESSION MICRODISCECTOMY;  Surgeon: Carmela Hurt;  Location: [facility];  Service: Neurosurgery;  Laterality: N/A;  right thoracotomy with thoracic eight-nine discectomy and fusion   LUMBAR LAMINECTOMY/DECOMPRESSION MICRODISCECTOMY Right 10/06/2021   Procedure: Right Lumbar Four-Five Microdiscectomy, Right Lumbar Five-Sacal One Foraminotomy;  Surgeon: Coletta Memos, MD;  Location: [facility];  Service: Neurosurgery;  Laterality: Right;  3C/RM 21   THORACIC DISCECTOMY  07/16/2012   Procedure: THORACIC DISCECTOMY;  Surgeon: Carmela Hurt, MD;  Location: [facility];  Service: Neurosurgery;  Laterality: Right;  RIGHT Thoracic seven-eight  thoracic diskectomy via thoracotomy by dr Laneta Simmers   THORACIC DISCECTOMY N/A 12/15/2014   Procedure: THORACIC SEVEN TO THORACIC NINE Laminectomy ;  Surgeon: Coletta Memos, MD;  Location: [facility];  Service: Neurosurgery;  Laterality: N/A;   THORACIC DISCECTOMY N/A 06/06/2017   Procedure: LAMINECTOMY THORACIC NINE-TEN;  Surgeon: Coletta Memos, MD;  Location: [facility];  Service: Neurosurgery;  Laterality: N/A;  LAMINECTOMY THORACIC NINE-TEN   THORACOTOMY  07/20/2011   Procedure: THORACOTOMY OPEN FOR SPINE SURGERY;  Surgeon: Norton Blizzard, MD;  Location: [facility];  Service: Vascular;  Laterality: N/A;   THORACOTOMY  07/16/2012   Procedure: THORACOTOMY OPEN FOR SPINE SURGERY;  Surgeon: Alleen Borne, MD;  Location: [facility];  Service: Thoracic;  Laterality: N/A;   Patient Active Problem List   Diagnosis Date Noted   Nerve pain 12/05/2022   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   S/P lumbar spinal fusion 05/25/2022   Synovial cyst of lumbar facet joint 05/25/2022   HNP (herniated nucleus pulposus), lumbar 10/06/2021   Hand pain 06/28/2021   Lumbar facet arthropathy 01/11/2021   Thoracic spondylosis with myelopathy 01/11/2021   Thoracic spinal stenosis 06/06/2017   Stenosis, spinal, thoracic 12/15/2014   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 09/22/2014   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/2011    PCP: Nira Retort  REFERRING PROVIDER: Ranelle Oyster, MD   REFERRING DIAG: thoracic disc disease with myelopathy, nerve pain, adhesive arachnoiditis  Rationale for Evaluation and Treatment: Rehabilitation  THERAPY DIAG:  Other low back pain  Other symptoms and signs involving the nervous system  Radiculopathy, lumbar region  Muscle weakness (generalized)  Repeated falls  ONSET DATE:  Suddenly started having weakness 11 years ago, most recent episode of worsening October 2022, s/p right L4-5 laminotomy, microdiscectomy  on 10/06/2021, now s/p L4-5 PLIF by Dr. Franky Macho on 05/25/2022.  PERTINENT HISTORY:  Patient is a 42 y.o. male who presents to outpatient physical therapy with a referral for medical diagnosis thoracic disc disease with myelopathy, nerve pain, adhesive arachnoiditis. This patient's chief complaints consist of disabling low back and R >L leg pain and  weakness with activity, bowel and bladder urgency, incontinence, and retention, leading to the following functional deficits: difficulty with or unable to complete any activity that requires weight bearing, use of B LE, and/or balance including working, household and community mobility, walking, driving, going to family gatherings, avoiding incontinent episodes, playing with daughter, helping around the house, bed mobility, transfers. 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 Franky Macho on 05/25/2022, adhesive arachnoiditis, nerve pain.  Patient denies hx of cancer, stroke, seizures, lung problems, heart problems, diabetes, unexplained weight loss, and osteoporosis.  SUBJECTIVE:                                                                                                                                                                                           SUBJECTIVE STATEMENT:     Patient arrives to treatment session with BRW. Reports his MD that the pain was just a flare up of the AA. He reports that pain is a lot better than it used to be. Rates it at 3/10 at low back, behind legs, groin, and R hip  PAIN:  NPRS: 3/10 low back, right hip, back of both legs to ankles (R > L).    PRECAUTIONS: Fall  PATIENT GOALS: "To get better, walk, drive again, climb stairs, play with my daughter, help around the house again"  NEXT MD VISIT: 03/13/2023 follow up with Dr. Riley Kill.   OBJECTIVE  TODAY'S TREATMENT:       Manual therapy: to reduce pain and tissue tension, improve range of motion,  neuromodulation, in order to promote improved ability to complete functional activities. SUPINE - LAD through right hip with belt, 3x45 seconds, decreased R hip pain but difficulty with right knee pain when going supine to sit afterwards. - performed at beginning and end of session   Therapeutic exercise: to centralize symptoms and improve ROM, strength, muscular endurance, and activity tolerance required for successful completion of functional activities.  - ambulation  over ground with BRW, 1x200 feet, 2-4 times patient caught one foot or the other on the floor and required heavy B UE support to prevent fall. He states he gets a sudden shock from his right hip to his low back that causes his leg to stop.  - sit <> stand from 22.5 inch plinth, 3x10 with attempts to use minimal UE support on RW.  - seated pallof press with single strand SilverTB held by PT, 3x10 each side.  - seated rows with SilverTB held by PT, 3x10 - ambulation with BRW and SBA approx 400 feet down ramp from clinic to front of building.  Pt required multimodal cuing for proper technique and to facilitate improved neuromuscular control, strength, range of motion, and functional ability resulting in improved performance and form.  PATIENT EDUCATION:  Education details: Exercise purpose/form. Self management techniques.  Person educated: patient Education method: Explanation Education comprehension: verbalized understanding and needs further education  HOME EXERCISE PROGRAM: Verbally:  - seated lumbar flexion stretch with head down and one LE extended using rollator, 1x10 each side.  - curl up with legs elevated, 5x15 seconds - sidelying open book 1x10 each side  ASSESSMENT:  CLINICAL IMPRESSION:     Patient arrives to treatment session motivated and reports improved symptoms since last session. Session focused on core and LE strengthening within tolerance. Patient would benefit from continued management of limiting  condition by skilled physical therapist to address remaining impairments and functional limitations to work towards stated goals and return to PLOF or maximal functional independence.   OBJECTIVE IMPAIRMENTS: Abnormal gait, decreased activity tolerance, decreased balance, decreased coordination, decreased endurance, decreased knowledge of condition, decreased knowledge of use of DME, decreased mobility, difficulty walking, decreased ROM, decreased strength, increased edema, impaired perceived functional ability, increased muscle spasms, impaired flexibility, impaired sensation, impaired tone, improper body mechanics, postural dysfunction, obesity, and pain.   ACTIVITY LIMITATIONS: carrying, lifting, bending, standing, squatting, stairs, transfers, bed mobility, continence, bathing, toileting, dressing, hygiene/grooming, locomotion level, and caring for others  PARTICIPATION LIMITATIONS: meal prep, cleaning, laundry, interpersonal relationship, driving, shopping, community activity, occupation, yard work, and   difficulty with or unable to complete any activity that requires weight bearing, use of B LE, and/or balance including working, household and community mobility, walking, driving, going to family gatherings, avoiding incontinent episodes, playing with daughter, helping around the house, bed mobility, transfers  PERSONAL FACTORS: Past/current experiences, Time since onset of injury/illness/exacerbation, and 3+ comorbidities:   3 thoracic spine surgeries, thoracic MRI notes "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 Franky Macho on 05/25/2022, adhesive arachnoiditis, nerve pain are also  affecting patient's functional outcome.   REHAB POTENTIAL: Fair due to severity and nature of condition.   CLINICAL DECISION MAKING: Evolving/moderate complexity  EVALUATION COMPLEXITY: Moderate   GOALS: Goals reviewed with patient? No  SHORT TERM GOALS: Target date: 01/09/2023. Target date updated to 05/28/2023 for all unmet goals on 03/05/2023.   Patient will be independent with initial home exercise program for self-management of symptoms. Baseline: Initial HEP to be provided at visit 2 as appropriate (12/26/22); Goal status: MET   LONG TERM GOALS: Target date: 03/20/2023  Patient will be independent with a long-term home exercise program for self-management of symptoms.  Baseline: Initial HEP to be provided at visit 2 as appropriate (12/26/22); participating as able (01/31/2023); patient currently participating as tolerated (03/05/2023; 04/09/2023);   Goal status: In-progress  2.  Patient will demonstrate improved FOTO by equal or greater than 10 points by visit #13 to demonstrate improvement in overall condition and self-reported functional ability.  Baseline: to be tested visit 2 as appropriate (12/26/22); 30 at visit #3 (01/02/2023); 46 at visit #13 (03/05/2023);  Goal status: MET  3.  Patient will demonstrate the ability to ambulate equal or greater than 600 feet with LRAD during the 6 minute walk to improve his household and community mobility.  Baseline: 34 feet with bari-RW (12/26/22); 200 feet with bari-RW (01/31/2023); 500 feet with BRW (03/05/2023); 400 feet with BRW (04/09/2023);  Goal status: In-progress  4.  Patient will complete 5 Time Sit To Stand Test from 19.5 inch surface or lower in equal or less than 15 seconds with no UE support to demonstrate improved transfer ability for improved household and toileting mobility.  Baseline: 23 seconds with heavy B UE support on RW from 19.5 inch plinth. Pain throbbing down the right LE.  (12/26/22); 19 seconds with heavy B UE support  on RW from 19.5 inch plinth (03/05/2023); 20 seconds with heavy B UE support on RRW from 18.5 inch plinth. Painful in back of B calves, thighs, and lower back (04/09/2023);  Goal status: In-progress  5.  Patient will report worst pain equal or less than 3/10 with functional activities to improve his ability to complete basic household and community mobility. Baseline: up to 8/10 (12/26/22); reports 3/10 pain (01/31/2023); reports 4/10 pain (02/19/2023); up to 7/10 in the last 2 week s(03/05/2023): up to  7/10 in the last two weeks (04/09/2023);  Goal status: In-progress   PLAN:  PT FREQUENCY: 1-2x/week  PT DURATION: 12 weeks  PLANNED INTERVENTIONS: Therapeutic exercises, Therapeutic activity, Neuromuscular re-education, Balance training, Gait training, Patient/Family education, Self Care, Joint mobilization, Stair training, Orthotic/Fit training, DME instructions, Aquatic Therapy, Dry Needling, Electrical stimulation, Wheelchair mobility training, Spinal mobilization, Cryotherapy, Moist heat, Manual therapy, and Re-evaluation.  PLAN FOR NEXT SESSION:  update HEP as appropriate, neurodynamics, gait training, LE/core/functional strengthening, stretching, and balance as tolerated, education, manual therapy as needed.  Viviann Spare, PT, DPT 05/15/2023, 6:49 PM   [facility] [address] [place], [place] [address] P: [phone] I F: [phone]

## 2023-05-15 ENCOUNTER — Encounter: Payer: Self-pay | Admitting: Physical Therapy

## 2023-05-15 ENCOUNTER — Ambulatory Visit: Payer: Medicare Other | Attending: Physical Medicine & Rehabilitation

## 2023-05-15 DIAGNOSIS — R29818 Other symptoms and signs involving the nervous system: Secondary | ICD-10-CM | POA: Insufficient documentation

## 2023-05-15 DIAGNOSIS — M5459 Other low back pain: Secondary | ICD-10-CM | POA: Insufficient documentation

## 2023-05-15 DIAGNOSIS — R262 Difficulty in walking, not elsewhere classified: Secondary | ICD-10-CM | POA: Diagnosis present

## 2023-05-15 DIAGNOSIS — R296 Repeated falls: Secondary | ICD-10-CM | POA: Diagnosis present

## 2023-05-15 DIAGNOSIS — M5416 Radiculopathy, lumbar region: Secondary | ICD-10-CM | POA: Diagnosis present

## 2023-05-15 DIAGNOSIS — M6281 Muscle weakness (generalized): Secondary | ICD-10-CM | POA: Insufficient documentation

## 2023-05-16 ENCOUNTER — Encounter: Payer: Self-pay | Admitting: Physical Medicine & Rehabilitation

## 2023-05-20 ENCOUNTER — Encounter: Payer: Self-pay | Admitting: Physical Therapy

## 2023-05-20 ENCOUNTER — Ambulatory Visit: Payer: Medicare Other | Admitting: Physical Therapy

## 2023-05-20 DIAGNOSIS — R296 Repeated falls: Secondary | ICD-10-CM

## 2023-05-20 DIAGNOSIS — M5416 Radiculopathy, lumbar region: Secondary | ICD-10-CM

## 2023-05-20 DIAGNOSIS — M5459 Other low back pain: Secondary | ICD-10-CM

## 2023-05-20 DIAGNOSIS — M6281 Muscle weakness (generalized): Secondary | ICD-10-CM

## 2023-05-20 DIAGNOSIS — R262 Difficulty in walking, not elsewhere classified: Secondary | ICD-10-CM

## 2023-05-20 DIAGNOSIS — R29818 Other symptoms and signs involving the nervous system: Secondary | ICD-10-CM

## 2023-05-20 NOTE — Therapy (Signed)
OUTPATIENT PHYSICAL THERAPY TREATMENT  Patient Name: Travis Fitzpatrick MRN: [id number] DOB:August 30, 1980, 42 y.o., male Today's Date: 05/20/23   END OF SESSION:  PT End of Session - 05/20/23 1827     Visit Number 27    Number of Visits 43    Date for PT Re-Evaluation 05/28/23    Authorization Type MEDICARE PART B reporting period from 04/09/2023    Progress Note Due on Visit 30    PT Start Time 1820    PT Stop Time 1858    PT Time Calculation (min) 38 min    Activity Tolerance Patient limited by pain;Patient limited by fatigue    Behavior During Therapy [facility] for tasks assessed/performed              Past Medical History:  Diagnosis Date   Bowel trouble    urgency   Medical history non-contributory    Urinary urgency    Past Surgical History:  Procedure Laterality Date   BACK SURGERY  2010   CIRCUMCISION     LUMBAR LAMINECTOMY/DECOMPRESSION MICRODISCECTOMY  07/20/2011   Procedure: LUMBAR LAMINECTOMY/DECOMPRESSION MICRODISCECTOMY;  Surgeon: Carmela Hurt;  Location: [facility];  Service: Neurosurgery;  Laterality: N/A;  right thoracotomy with thoracic eight-nine discectomy and fusion   LUMBAR LAMINECTOMY/DECOMPRESSION MICRODISCECTOMY Right 10/06/2021   Procedure: Right Lumbar Four-Five Microdiscectomy, Right Lumbar Five-Sacal One Foraminotomy;  Surgeon: Coletta Memos, MD;  Location: [facility];  Service: Neurosurgery;  Laterality: Right;  3C/RM 21   THORACIC DISCECTOMY  07/16/2012   Procedure: THORACIC DISCECTOMY;  Surgeon: Carmela Hurt, MD;  Location: [facility];  Service: Neurosurgery;  Laterality: Right;  RIGHT Thoracic seven-eight  thoracic diskectomy via thoracotomy by dr Laneta Simmers   THORACIC DISCECTOMY N/A 12/15/2014   Procedure: THORACIC SEVEN TO THORACIC NINE Laminectomy ;  Surgeon: Coletta Memos, MD;  Location: [facility];  Service: Neurosurgery;  Laterality: N/A;   THORACIC DISCECTOMY N/A 06/06/2017   Procedure: LAMINECTOMY THORACIC NINE-TEN;  Surgeon: Coletta Memos, MD;   Location: [facility];  Service: Neurosurgery;  Laterality: N/A;  LAMINECTOMY THORACIC NINE-TEN   THORACOTOMY  07/20/2011   Procedure: THORACOTOMY OPEN FOR SPINE SURGERY;  Surgeon: Norton Blizzard, MD;  Location: [facility];  Service: Vascular;  Laterality: N/A;   THORACOTOMY  07/16/2012   Procedure: THORACOTOMY OPEN FOR SPINE SURGERY;  Surgeon: Alleen Borne, MD;  Location: [facility];  Service: Thoracic;  Laterality: N/A;   Patient Active Problem List   Diagnosis Date Noted   Nerve pain 12/05/2022   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   S/P lumbar spinal fusion 05/25/2022   Synovial cyst of lumbar facet joint 05/25/2022   HNP (herniated nucleus pulposus), lumbar 10/06/2021   Hand pain 06/28/2021   Lumbar facet arthropathy 01/11/2021   Thoracic spondylosis with myelopathy 01/11/2021   Thoracic spinal stenosis 06/06/2017   Stenosis, spinal, thoracic 12/15/2014   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 09/22/2014   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/2011    PCP: Nira Retort  REFERRING PROVIDER: Ranelle Oyster, MD  REFERRING DIAG: thoracic disc disease with myelopathy, nerve pain, adhesive arachnoiditis  Rationale for Evaluation and Treatment: Rehabilitation  THERAPY DIAG:  Other low back pain  Other symptoms and signs involving the nervous system  Radiculopathy, lumbar region  Muscle weakness (generalized)  Repeated falls  Difficulty in walking, not elsewhere classified  ONSET DATE:  Suddenly started having weakness 11 years ago, most recent episode of worsening October 2022, s/p right L4-5 laminotomy,  microdiscectomy on 10/06/2021, now s/p L4-5 PLIF by Dr. Franky Macho on 05/25/2022.  PERTINENT HISTORY:  Patient is a 42 y.o. male who presents to outpatient physical therapy with a referral for medical diagnosis thoracic disc disease with myelopathy, nerve pain, adhesive arachnoiditis. This patient's chief complaints consist of disabling  low back and R >L leg pain and weakness with activity, bowel and bladder urgency, incontinence, and retention, leading to the following functional deficits: difficulty with or unable to complete any activity that requires weight bearing, use of B LE, and/or balance including working, household and community mobility, walking, driving, going to family gatherings, avoiding incontinent episodes, playing with daughter, helping around the house, bed mobility, transfers. 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 Franky Macho on 05/25/2022, adhesive arachnoiditis, nerve pain.  Patient denies hx of cancer, stroke, seizures, lung problems, heart problems, diabetes, unexplained weight loss, and osteoporosis.  SUBJECTIVE:                                                                                                                                                                                           SUBJECTIVE STATEMENT:     Patient arrives to treatment session with BRW. He states he is "so, so" today. He has 3/10 pain in the usual areas. He states his right knee has been starting to hurt, sometimes when in bed and when his daughter touches it, it feels like a shock.   PAIN:  NPRS: 3/10 low back, right hip, back of both legs to ankles (R > L).   PRECAUTIONS: Fall  PATIENT GOALS: "To get better, walk, drive again, climb stairs, play with my daughter, help around the house again"  NEXT MD VISIT: 03/13/2023 follow up with Dr. Riley Kill.   OBJECTIVE  TODAY'S TREATMENT:       Manual therapy: to  reduce pain and tissue tension, improve range of motion, neuromodulation, in order to promote improved ability to complete functional activities. SUPINE - LAD through right hip with belt, 3x45 seconds, decreased R hip pain but difficulty with right knee pain when going supine to sit afterwards. - performed at beginning at session.   Therapeutic exercise: to centralize symptoms and improve ROM, strength, muscular endurance, and activity tolerance required for successful completion of functional activities.  - ambulation over  ground with BRW, 1x200 feet, heavy UE support.  - sit <> stand from 22.5 inch plinth, 3x10 with attempts to use minimal UE support on RW.  - seated trunk rotation single strand SilverTB held by PT, 3x10 with anchor on left side, 2x10 with anchor on right side (discontinued due to pain).   - seated rows with SilverTB held by PT, 3x10. - ambulation over ground with BRW, 1x200 feet, heavy UE support, mild LOB with each foot where foot didn't move when he wanted it to.  - ambulation with BRW and SBA approx 100 feet down ramp from clinic to front of building.  Pt required multimodal cuing for proper technique and to facilitate improved neuromuscular control, strength, range of motion, and functional ability resulting in improved performance and form.  PATIENT EDUCATION:  Education details: Exercise purpose/form. Self management techniques.  Person educated: patient Education method: Explanation Education comprehension: verbalized understanding and needs further education  HOME EXERCISE PROGRAM: Verbally:  - seated lumbar flexion stretch with head down and one LE extended using rollator, 1x10 each side.  - curl up with legs elevated, 5x15 seconds - sidelying open book 1x10 each side  ASSESSMENT:  CLINICAL IMPRESSION:     Patient arrives to PT reporting mild increase in symptoms with continued high level of motivation to work on improving function. Today's session continued  with tolerated strengthening and mobility exercises to maximize function. He was limited in trunk rotation exercise by increased symptoms with anchor to the right of body. He was unable to continue with further exercises by end of session without risk of significantly aggravating symptoms that had been increasing throughout visit. Patient would benefit from continued management of limiting condition by skilled physical therapist to address remaining impairments and functional limitations to work towards stated goals and return to PLOF or maximal functional independence.    OBJECTIVE IMPAIRMENTS: Abnormal gait, decreased activity tolerance, decreased balance, decreased coordination, decreased endurance, decreased knowledge of condition, decreased knowledge of use of DME, decreased mobility, difficulty walking, decreased ROM, decreased strength, increased edema, impaired perceived functional ability, increased muscle spasms, impaired flexibility, impaired sensation, impaired tone, improper body mechanics, postural dysfunction, obesity, and pain.   ACTIVITY LIMITATIONS: carrying, lifting, bending, standing, squatting, stairs, transfers, bed mobility, continence, bathing, toileting, dressing, hygiene/grooming, locomotion level, and caring for others  PARTICIPATION LIMITATIONS: meal prep, cleaning, laundry, interpersonal relationship, driving, shopping, community activity, occupation, yard work, and   difficulty with or unable to complete any activity that requires weight bearing, use of B LE, and/or balance including working, household and community mobility, walking, driving, going to family gatherings, avoiding incontinent episodes, playing with daughter, helping around the house, bed mobility, transfers  PERSONAL FACTORS: Past/current experiences, Time since onset of injury/illness/exacerbation, and 3+ comorbidities:   3 thoracic spine surgeries, thoracic MRI notes "Myelomalacia with severe cord atrophy from  T7 through T9-10 and mild decreased volume the remainder of the thoracic cord, stable" s/p right L4-5 laminotomy, microdiscectomy on 10/06/2021, 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 Franky Macho on 05/25/2022, adhesive arachnoiditis, nerve pain are also affecting patient's functional outcome.   REHAB POTENTIAL: Fair due to severity and nature of condition.   CLINICAL DECISION MAKING: Evolving/moderate complexity  EVALUATION COMPLEXITY: Moderate   GOALS: Goals reviewed with patient? No  SHORT TERM GOALS: Target date: 01/09/2023. Target date updated to 05/28/2023 for all unmet goals on 03/05/2023.  Patient will be independent with initial home exercise program for self-management of symptoms. Baseline: Initial HEP to be provided at visit 2 as appropriate (12/26/22); Goal status: MET   LONG TERM GOALS: Target date: 03/20/2023  Patient will be independent with a long-term home exercise program for self-management of symptoms.  Baseline: Initial HEP to be provided at visit 2 as appropriate (12/26/22); participating as able (01/31/2023); patient currently participating as tolerated (03/05/2023; 04/09/2023);   Goal status: In-progress  2.  Patient will demonstrate improved FOTO by equal or greater than 10 points by visit #13 to demonstrate improvement in overall condition and self-reported functional ability.  Baseline: to be tested visit 2 as appropriate (12/26/22); 30 at visit #3 (01/02/2023); 46 at visit #13 (03/05/2023);  Goal status: MET  3.  Patient will demonstrate the ability to ambulate equal or greater than 600 feet with LRAD during the 6 minute walk to improve his household and community mobility.  Baseline: 34 feet with bari-RW (12/26/22); 200 feet with bari-RW (01/31/2023); 500 feet with BRW (03/05/2023); 400 feet with BRW (04/09/2023);  Goal status:  In-progress  4.  Patient will complete 5 Time Sit To Stand Test from 19.5 inch surface or lower in equal or less than 15 seconds with no UE support to demonstrate improved transfer ability for improved household and toileting mobility.  Baseline: 23 seconds with heavy B UE support on RW from 19.5 inch plinth. Pain throbbing down the right LE.  (12/26/22); 19 seconds with heavy B UE support on RW from 19.5 inch plinth (03/05/2023); 20 seconds with heavy B UE support on RRW from 18.5 inch plinth. Painful in back of B calves, thighs, and lower back (04/09/2023);  Goal status: In-progress  5.  Patient will report worst pain equal or less than 3/10 with functional activities to improve his ability to complete basic household and community mobility. Baseline: up to 8/10 (12/26/22); reports 3/10 pain (01/31/2023); reports 4/10 pain (02/19/2023); up to 7/10 in the last 2 week s(03/05/2023): up to  7/10 in the last two weeks (04/09/2023);  Goal status: In-progress   PLAN:  PT FREQUENCY: 1-2x/week  PT DURATION: 12 weeks  PLANNED INTERVENTIONS: Therapeutic exercises, Therapeutic activity, Neuromuscular re-education, Balance training, Gait training, Patient/Family education, Self Care, Joint mobilization, Stair training, Orthotic/Fit training, DME instructions, Aquatic Therapy, Dry Needling, Electrical stimulation, Wheelchair mobility training, Spinal mobilization, Cryotherapy, Moist heat, Manual therapy, and Re-evaluation.  PLAN FOR NEXT SESSION:  update HEP as appropriate, neurodynamics, gait training, LE/core/functional strengthening, stretching, and balance as tolerated, education, manual therapy as needed.  Cira Rue, PT, DPT 05/20/2023, 7:27 PM   [facility] [address] [place], [place] [address] P: [phone] I F: [phone]

## 2023-05-22 ENCOUNTER — Encounter: Payer: Self-pay | Admitting: Physical Therapy

## 2023-05-22 ENCOUNTER — Ambulatory Visit: Payer: Medicare Other | Admitting: Physical Therapy

## 2023-05-22 DIAGNOSIS — M5459 Other low back pain: Secondary | ICD-10-CM

## 2023-05-22 DIAGNOSIS — R262 Difficulty in walking, not elsewhere classified: Secondary | ICD-10-CM

## 2023-05-22 DIAGNOSIS — R296 Repeated falls: Secondary | ICD-10-CM

## 2023-05-22 DIAGNOSIS — M5416 Radiculopathy, lumbar region: Secondary | ICD-10-CM

## 2023-05-22 DIAGNOSIS — R29818 Other symptoms and signs involving the nervous system: Secondary | ICD-10-CM

## 2023-05-22 DIAGNOSIS — M6281 Muscle weakness (generalized): Secondary | ICD-10-CM

## 2023-05-22 NOTE — Therapy (Signed)
OUTPATIENT PHYSICAL THERAPY TREATMENT  Patient Name: Travis Fitzpatrick MRN: [id number] DOB:04-Jan-1981, 42 y.o., male Today's Date: 05/22/23   END OF SESSION:  PT End of Session - 05/22/23 1852     Visit Number 28    Number of Visits 43    Date for PT Re-Evaluation 05/28/23    Authorization Type MEDICARE PART B reporting period from 04/09/2023    Progress Note Due on Visit 30    PT Start Time 1820    PT Stop Time 1858    PT Time Calculation (min) 38 min    Activity Tolerance Patient limited by pain;Patient limited by fatigue    Behavior During Therapy [facility] for tasks assessed/performed               Past Medical History:  Diagnosis Date   Bowel trouble    urgency   Medical history non-contributory    Urinary urgency    Past Surgical History:  Procedure Laterality Date   BACK SURGERY  2010   CIRCUMCISION     LUMBAR LAMINECTOMY/DECOMPRESSION MICRODISCECTOMY  07/20/2011   Procedure: LUMBAR LAMINECTOMY/DECOMPRESSION MICRODISCECTOMY;  Surgeon: Carmela Hurt;  Location: [facility];  Service: Neurosurgery;  Laterality: N/A;  right thoracotomy with thoracic eight-nine discectomy and fusion   LUMBAR LAMINECTOMY/DECOMPRESSION MICRODISCECTOMY Right 10/06/2021   Procedure: Right Lumbar Four-Five Microdiscectomy, Right Lumbar Five-Sacal One Foraminotomy;  Surgeon: Coletta Memos, MD;  Location: [facility];  Service: Neurosurgery;  Laterality: Right;  3C/RM 21   THORACIC DISCECTOMY  07/16/2012   Procedure: THORACIC DISCECTOMY;  Surgeon: Carmela Hurt, MD;  Location: [facility];  Service: Neurosurgery;  Laterality: Right;  RIGHT Thoracic seven-eight  thoracic diskectomy via thoracotomy by dr Laneta Simmers   THORACIC DISCECTOMY N/A 12/15/2014   Procedure: THORACIC SEVEN TO THORACIC NINE Laminectomy ;  Surgeon: Coletta Memos, MD;  Location: [facility];  Service: Neurosurgery;  Laterality: N/A;   THORACIC DISCECTOMY N/A 06/06/2017   Procedure: LAMINECTOMY THORACIC NINE-TEN;  Surgeon: Coletta Memos,  MD;  Location: [facility];  Service: Neurosurgery;  Laterality: N/A;  LAMINECTOMY THORACIC NINE-TEN   THORACOTOMY  07/20/2011   Procedure: THORACOTOMY OPEN FOR SPINE SURGERY;  Surgeon: Norton Blizzard, MD;  Location: [facility];  Service: Vascular;  Laterality: N/A;   THORACOTOMY  07/16/2012   Procedure: THORACOTOMY OPEN FOR SPINE SURGERY;  Surgeon: Alleen Borne, MD;  Location: [facility];  Service: Thoracic;  Laterality: N/A;   Patient Active Problem List   Diagnosis Date Noted   Nerve pain 12/05/2022   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   S/P lumbar spinal fusion 05/25/2022   Synovial cyst of lumbar facet joint 05/25/2022   HNP (herniated nucleus pulposus), lumbar 10/06/2021   Hand pain 06/28/2021   Lumbar facet arthropathy 01/11/2021   Thoracic spondylosis with myelopathy 01/11/2021   Thoracic spinal stenosis 06/06/2017   Stenosis, spinal, thoracic 12/15/2014   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 09/22/2014   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/2011    PCP: Nira Retort  REFERRING PROVIDER: Ranelle Oyster, MD  REFERRING DIAG: thoracic disc disease with myelopathy, nerve pain, adhesive arachnoiditis  Rationale for Evaluation and Treatment: Rehabilitation  THERAPY DIAG:  Other low back pain  Other symptoms and signs involving the nervous system  Radiculopathy, lumbar region  Muscle weakness (generalized)  Repeated falls  Difficulty in walking, not elsewhere classified  ONSET DATE:  Suddenly started having weakness 11 years ago, most recent episode of worsening October 2022, s/p right L4-5  laminotomy, microdiscectomy on 10/06/2021, now s/p L4-5 PLIF by Dr. Franky Macho on 05/25/2022.  PERTINENT HISTORY:  Patient is a 42 y.o. male who presents to outpatient physical therapy with a referral for medical diagnosis thoracic disc disease with myelopathy, nerve pain, adhesive arachnoiditis. This patient's chief complaints consist of  disabling low back and R >L leg pain and weakness with activity, bowel and bladder urgency, incontinence, and retention, leading to the following functional deficits: difficulty with or unable to complete any activity that requires weight bearing, use of B LE, and/or balance including working, household and community mobility, walking, driving, going to family gatherings, avoiding incontinent episodes, playing with daughter, helping around the house, bed mobility, transfers. 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 Franky Macho on 05/25/2022, adhesive arachnoiditis, nerve pain.  Patient denies hx of cancer, stroke, seizures, lung problems, heart problems, diabetes, unexplained weight loss, and osteoporosis.  SUBJECTIVE:                                                                                                                                                                                           SUBJECTIVE STATEMENT:     Patient arrives to treatment session with BRW. He states he is doing okay, a little worse than when he arrived at last PT session. He was sore and had a little more pain after last PT session but it calmed down by about mid-day the next day.   PAIN:  NPRS: 3/10 low back, right hip/groin, back of both legs to ankles (R > L).   PRECAUTIONS: Fall  PATIENT GOALS: "To get better, walk, drive again, climb stairs, play with my daughter, help around the house again"  NEXT MD VISIT: 03/13/2023 follow up with Dr. Riley Kill.   OBJECTIVE  TODAY'S TREATMENT:       Manual  therapy: to reduce pain and tissue tension, improve range of motion, neuromodulation, in order to promote improved ability to complete functional activities. SUPINE - LAD through right hip with belt, 3x45 seconds, decreased R hip pain but difficulty with right knee pain after releasing (although slowly). - performed at beginning at session.   Therapeutic exercise: to centralize symptoms and improve ROM, strength, muscular endurance, and activity tolerance required for successful completion of functional activities.  - ambulation over ground  with BRW, 1x200 feet, heavy UE support.  - sit <> stand from 22.5 inch plinth, 3x10 with attempts to use minimal UE support on RW.  - seated trunk rotation single strand SilverTB held by PT, 3x10 with anchor on left side, 2x10 with anchor on right side (discontinued due to pain).   - seated rows with SilverTB held by PT, 3x10. - standing lateral sways with hands hovering over RW handles, 5x30 seconds (lightheaded after first set).  - ambulation with BRW and SBA approx 100 feet down ramp from clinic to vehicle.  Pt required multimodal cuing for proper technique and to facilitate improved neuromuscular control, strength, range of motion, and functional ability resulting in improved performance and form.  PATIENT EDUCATION:  Education details: Exercise purpose/form. Self management techniques.  Person educated: patient Education method: Explanation Education comprehension: verbalized understanding and needs further education  HOME EXERCISE PROGRAM: Verbally:  - seated lumbar flexion stretch with head down and one LE extended using rollator, 1x10 each side.  - curl up with legs elevated, 5x15 seconds - sidelying open book 1x10 each side  ASSESSMENT:  CLINICAL IMPRESSION:     Patient arrives to PT reporting worsening in symptoms since last PT session. He was unable to tolerate as much walking today, but did well with swaying exercise to continue working on  weight acceptance. He was able to tolerate full volume of planned pallof press without rotation today. Patient continues to be very limited in mobility due to pain, weakness, and motor control issues stemming from nerve compromise caused by adhesive arachnoiditis. Patient would benefit from continued management of limiting condition by skilled physical therapist to address remaining impairments and functional limitations to work towards stated goals and return to PLOF or maximal functional independence.   OBJECTIVE IMPAIRMENTS: Abnormal gait, decreased activity tolerance, decreased balance, decreased coordination, decreased endurance, decreased knowledge of condition, decreased knowledge of use of DME, decreased mobility, difficulty walking, decreased ROM, decreased strength, increased edema, impaired perceived functional ability, increased muscle spasms, impaired flexibility, impaired sensation, impaired tone, improper body mechanics, postural dysfunction, obesity, and pain.   ACTIVITY LIMITATIONS: carrying, lifting, bending, standing, squatting, stairs, transfers, bed mobility, continence, bathing, toileting, dressing, hygiene/grooming, locomotion level, and caring for others  PARTICIPATION LIMITATIONS: meal prep, cleaning, laundry, interpersonal relationship, driving, shopping, community activity, occupation, yard work, and   difficulty with or unable to complete any activity that requires weight bearing, use of B LE, and/or balance including working, household and community mobility, walking, driving, going to family gatherings, avoiding incontinent episodes, playing with daughter, helping around the house, bed mobility, transfers  PERSONAL FACTORS: Past/current experiences, Time since onset of injury/illness/exacerbation, and 3+ comorbidities:   3 thoracic spine surgeries, thoracic MRI notes "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 Franky Macho on 05/25/2022, adhesive arachnoiditis, nerve pain are also affecting patient's functional outcome.   REHAB POTENTIAL: Fair due to severity and nature of condition.   CLINICAL DECISION MAKING: Evolving/moderate complexity  EVALUATION COMPLEXITY: Moderate   GOALS: Goals reviewed with patient? No  SHORT TERM GOALS: Target date: 01/09/2023. Target date updated to 05/28/2023 for all unmet goals on 03/05/2023.   Patient will be independent with initial home exercise program for self-management of symptoms. Baseline: Initial  HEP to be provided at visit 2 as appropriate (12/26/22); Goal status: MET   LONG TERM GOALS: Target date: 03/20/2023  Patient will be independent with a long-term home exercise program for self-management of symptoms.  Baseline: Initial HEP to be provided at visit 2 as appropriate (12/26/22); participating as able (01/31/2023); patient currently participating as tolerated (03/05/2023; 04/09/2023);   Goal status: In-progress  2.  Patient will demonstrate improved FOTO by equal or greater than 10 points by visit #13 to demonstrate improvement in overall condition and self-reported functional ability.  Baseline: to be tested visit 2 as appropriate (12/26/22); 30 at visit #3 (01/02/2023); 46 at visit #13 (03/05/2023);  Goal status: MET  3.  Patient will demonstrate the ability to ambulate equal or greater than 600 feet with LRAD during the 6 minute walk to improve his household and community mobility.  Baseline: 34 feet with bari-RW (12/26/22); 200 feet with bari-RW (01/31/2023); 500 feet with BRW (03/05/2023); 400 feet with BRW (04/09/2023);  Goal status: In-progress  4.  Patient will complete 5 Time Sit To Stand Test from  19.5 inch surface or lower in equal or less than 15 seconds with no UE support to demonstrate improved transfer ability for improved household and toileting mobility.  Baseline: 23 seconds with heavy B UE support on RW from 19.5 inch plinth. Pain throbbing down the right LE.  (12/26/22); 19 seconds with heavy B UE support on RW from 19.5 inch plinth (03/05/2023); 20 seconds with heavy B UE support on RRW from 18.5 inch plinth. Painful in back of B calves, thighs, and lower back (04/09/2023);  Goal status: In-progress  5.  Patient will report worst pain equal or less than 3/10 with functional activities to improve his ability to complete basic household and community mobility. Baseline: up to 8/10 (12/26/22); reports 3/10 pain (01/31/2023); reports 4/10 pain (02/19/2023); up to 7/10 in the last 2 week s(03/05/2023): up to  7/10 in the last two weeks (04/09/2023);  Goal status: In-progress   PLAN:  PT FREQUENCY: 1-2x/week  PT DURATION: 12 weeks  PLANNED INTERVENTIONS: Therapeutic exercises, Therapeutic activity, Neuromuscular re-education, Balance training, Gait training, Patient/Family education, Self Care, Joint mobilization, Stair training, Orthotic/Fit training, DME instructions, Aquatic Therapy, Dry Needling, Electrical stimulation, Wheelchair mobility training, Spinal mobilization, Cryotherapy, Moist heat, Manual therapy, and Re-evaluation.  PLAN FOR NEXT SESSION:  update HEP as appropriate, neurodynamics, gait training, LE/core/functional strengthening, stretching, and balance as tolerated, education, manual therapy as needed.  Cira Rue, PT, DPT 05/22/2023, 7:17 PM   [facility] [address] [place], [place] [address] P: [phone] I F: [phone]

## 2023-05-26 ENCOUNTER — Other Ambulatory Visit: Payer: Self-pay | Admitting: Physical Medicine & Rehabilitation

## 2023-05-27 ENCOUNTER — Ambulatory Visit: Payer: Medicare Other | Admitting: Physical Therapy

## 2023-05-29 ENCOUNTER — Ambulatory Visit: Payer: Medicare Other | Admitting: Physical Therapy

## 2023-06-03 ENCOUNTER — Ambulatory Visit: Payer: Medicare Other | Admitting: Physical Therapy

## 2023-06-03 DIAGNOSIS — M5459 Other low back pain: Secondary | ICD-10-CM

## 2023-06-03 DIAGNOSIS — R262 Difficulty in walking, not elsewhere classified: Secondary | ICD-10-CM

## 2023-06-03 DIAGNOSIS — M5416 Radiculopathy, lumbar region: Secondary | ICD-10-CM

## 2023-06-03 DIAGNOSIS — M6281 Muscle weakness (generalized): Secondary | ICD-10-CM

## 2023-06-03 DIAGNOSIS — R296 Repeated falls: Secondary | ICD-10-CM

## 2023-06-03 DIAGNOSIS — R29818 Other symptoms and signs involving the nervous system: Secondary | ICD-10-CM

## 2023-06-03 NOTE — Therapy (Unsigned)
OUTPATIENT PHYSICAL THERAPY TREATMENT / PROGRESS NOTE / RE-CERTIFICATION  Dates of reporting from 04/09/2023 to 06/03/2023  Patient Name: Travis Fitzpatrick MRN: [id number] DOB:[birth year], 42 y.o., male Today's Date: 06/03/23   END OF SESSION:  PT End of Session - 06/03/23 1827     Visit Number 29    Number of Visits 43    Date for PT Re-Evaluation 08/26/23    Authorization Type MEDICARE PART B reporting period from 04/09/2023    Progress Note Due on Visit 30    PT Start Time 1825    PT Stop Time 1903    PT Time Calculation (min) 38 min    Activity Tolerance Patient limited by pain;Patient limited by fatigue    Behavior During Therapy [facility] for tasks assessed/performed                Past Medical History:  Diagnosis Date   Bowel trouble    urgency   Medical history non-contributory    Urinary urgency    Past Surgical History:  Procedure Laterality Date   BACK SURGERY  2010   CIRCUMCISION     LUMBAR LAMINECTOMY/DECOMPRESSION MICRODISCECTOMY  07/20/2011   Procedure: LUMBAR LAMINECTOMY/DECOMPRESSION MICRODISCECTOMY;  Surgeon: Carmela Hurt;  Location: [facility];  Service: Neurosurgery;  Laterality: N/A;  right thoracotomy with thoracic eight-nine discectomy and fusion   LUMBAR LAMINECTOMY/DECOMPRESSION MICRODISCECTOMY Right 10/06/2021   Procedure: Right Lumbar Four-Five Microdiscectomy, Right Lumbar Five-Sacal One Foraminotomy;  Surgeon: Coletta Memos, MD;  Location: [facility];  Service: Neurosurgery;  Laterality: Right;  3C/RM 21   THORACIC DISCECTOMY  07/16/2012   Procedure: THORACIC DISCECTOMY;  Surgeon: Carmela Hurt, MD;  Location: [facility];  Service: Neurosurgery;  Laterality: Right;  RIGHT Thoracic seven-eight  thoracic diskectomy via thoracotomy by dr Laneta Simmers   THORACIC DISCECTOMY N/A 12/15/2014   Procedure: THORACIC SEVEN TO THORACIC NINE Laminectomy ;  Surgeon: Coletta Memos, MD;  Location: [facility];  Service: Neurosurgery;  Laterality: N/A;   THORACIC DISCECTOMY  N/A 06/06/2017   Procedure: LAMINECTOMY THORACIC NINE-TEN;  Surgeon: Coletta Memos, MD;  Location: [facility];  Service: Neurosurgery;  Laterality: N/A;  LAMINECTOMY THORACIC NINE-TEN   THORACOTOMY  07/20/2011   Procedure: THORACOTOMY OPEN FOR SPINE SURGERY;  Surgeon: Norton Blizzard, MD;  Location: [facility];  Service: Vascular;  Laterality: N/A;   THORACOTOMY  07/16/2012   Procedure: THORACOTOMY OPEN FOR SPINE SURGERY;  Surgeon: Alleen Borne, MD;  Location: [facility];  Service: Thoracic;  Laterality: N/A;   Patient Active Problem List   Diagnosis Date Noted   Nerve pain 12/05/2022   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   S/P lumbar spinal fusion 05/25/2022   Synovial cyst of lumbar facet joint 05/25/2022   HNP (herniated nucleus pulposus), lumbar 10/06/2021   Hand pain 06/28/2021   Lumbar facet arthropathy 01/11/2021   Thoracic spondylosis with myelopathy 01/11/2021   Thoracic spinal stenosis 06/06/2017   Stenosis, spinal, thoracic 12/15/2014   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 09/22/2014   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/2011    PCP: Nira Retort  REFERRING PROVIDER: Ranelle Oyster, MD  REFERRING DIAG: thoracic disc disease with myelopathy, nerve pain, adhesive arachnoiditis  Rationale for Evaluation and Treatment: Rehabilitation  THERAPY DIAG:  Other low back pain  Other symptoms and signs involving the nervous system  Radiculopathy, lumbar region  Muscle weakness (generalized)  Repeated falls  Difficulty in walking, not elsewhere classified  ONSET DATE:  Suddenly started having  weakness 11 years ago, most recent episode of worsening October 2022, s/p right L4-5 laminotomy, microdiscectomy on 10/06/2021, now s/p L4-5 PLIF by Dr. Franky Macho on 05/25/2022.  PERTINENT HISTORY:  Patient is a 42 y.o. male who presents to outpatient physical therapy with a referral for medical diagnosis thoracic disc disease with myelopathy,  nerve pain, adhesive arachnoiditis. This patient's chief complaints consist of disabling low back and R >L leg pain and weakness with activity, bowel and bladder urgency, incontinence, and retention, leading to the following functional deficits: difficulty with or unable to complete any activity that requires weight bearing, use of B LE, and/or balance including working, household and community mobility, walking, driving, going to family gatherings, avoiding incontinent episodes, playing with daughter, helping around the house, bed mobility, transfers. 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 Franky Macho on 05/25/2022, adhesive arachnoiditis, nerve pain.  Patient denies hx of cancer, stroke, seizures, lung problems, heart problems, diabetes, unexplained weight loss, and osteoporosis.  SUBJECTIVE:                                                                                                                                                                                           SUBJECTIVE STATEMENT:     Patient arrives to treatment session with BRW. He states he felt okay after last PT session but on Saturday he started having syptoms in his right leg where he could not tolerate putting pressure on the right LE. This continued until last Friday. He is able to walk on it again. He feels like PT helps him with strength, playing with his daughter more, standing a little bit longer and walk a little bit longer than before. He states Dr. Riley Kill said his condition is progressive and effects the brain,  the eyes, and the ears.   PAIN:  NPRS: 3/10 bilateral anterior thighs, back of thighs/calves, right shin, and right hip groin.  PRECAUTIONS: Fall  PATIENT GOALS: "To get better, walk, drive again, climb stairs, play with my daughter, help around the house again"  NEXT MD VISIT: 03/13/2023 follow up with Dr. Riley Kill.   OBJECTIVE   SELF-REPORTED FUNCTION FOTO score: 45/100 (Inflammatory Diseases of the Nervous System) questionnaire)     FUNCTIONAL/BALANCE TESTS: 6 Minute Walk Test: 152 feet with BRW. Unable to  continue past 3:05 due to low back and nerve pain as well as progressive LE weakness.  Pain up to 4/10.   Five Time Sit to Stand (5TSTS): 15 seconds with heavy B UE support on BRW from 18.5 inch plinth. Painful in back of B calves, thighs, and lower back.   TODAY'S TREATMENT:       Manual therapy: to reduce pain and tissue tension, improve range of motion, neuromodulation, in order to promote improved ability to complete functional activities. SUPINE - LAD through right hip with belt, 3x45 seconds, decreased R hip pain but difficulty with right knee pain after releasing (although slowly). - performed at beginning at session.   Therapeutic exercise: to centralize symptoms and improve ROM, strength, muscular endurance, and activity tolerance required for successful completion of functional activities.  - ambulation over ground with BRW, 1x200 feet, heavy UE support.  - sit <> stand from 22.5 inch plinth, 3x10 with attempts to use minimal UE support on RW.  - seated trunk rotation single strand SilverTB held by PT, 3x10 with anchor on left side, 2x10 with anchor on right side (discontinued due to pain).   - seated rows with SilverTB held by PT, 3x10. - standing lateral sways with hands hovering over RW handles, 5x30 seconds (lightheaded after first set).  - ambulation with BRW and SBA approx 100 feet down ramp from clinic to vehicle.  Pt required multimodal cuing for proper  technique and to facilitate improved neuromuscular control, strength, range of motion, and functional ability resulting in improved performance and form.  PATIENT EDUCATION:  Education details: Exercise purpose/form. Self management techniques.  Person educated: patient Education method: Explanation Education comprehension: verbalized understanding and needs further education  HOME EXERCISE PROGRAM: Verbally:  - seated lumbar flexion stretch with head down and one LE extended using rollator, 1x10 each side.  - curl up with legs elevated, 5x15 seconds - sidelying open book 1x10 each side  ASSESSMENT:  CLINICAL IMPRESSION:     Patient arrives to PT reporting worsening in symptoms since last PT session. He was unable to tolerate as much walking today, but did well with swaying exercise to continue working on weight acceptance. He was able to tolerate full volume of planned pallof press without rotation today. Patient continues to be very limited in mobility due to pain, weakness, and motor control issues stemming from nerve compromise caused by adhesive arachnoiditis. Patient would benefit from continued management of limiting condition by skilled physical therapist to address remaining impairments and functional limitations to work towards stated goals and return to PLOF or maximal functional independence.   OBJECTIVE IMPAIRMENTS: Abnormal gait, decreased activity tolerance, decreased balance, decreased coordination, decreased endurance, decreased knowledge of condition, decreased knowledge of use of DME, decreased mobility, difficulty walking, decreased ROM, decreased strength, increased edema, impaired perceived functional ability, increased muscle spasms, impaired flexibility, impaired sensation, impaired tone, improper body mechanics, postural dysfunction, obesity, and pain.   ACTIVITY LIMITATIONS: carrying, lifting, bending, standing, squatting, stairs, transfers, bed mobility, continence,  bathing, toileting, dressing, hygiene/grooming, locomotion level, and caring for others  PARTICIPATION LIMITATIONS: meal prep, cleaning, laundry, interpersonal relationship, driving, shopping, community activity, occupation, yard work, and   difficulty with or unable to complete any activity that requires weight bearing, use of B LE, and/or balance including working, household and community mobility, walking, driving, going to family gatherings, avoiding incontinent episodes, playing with daughter, helping around the house, bed mobility, transfers  PERSONAL FACTORS: Past/current experiences, Time since onset of injury/illness/exacerbation, and 3+  comorbidities:   3 thoracic spine surgeries, thoracic MRI notes "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 Franky Macho on 05/25/2022, adhesive arachnoiditis, nerve pain are also affecting patient's functional outcome.   REHAB POTENTIAL: Fair due to severity and nature of condition.   CLINICAL DECISION MAKING: Evolving/moderate complexity  EVALUATION COMPLEXITY: Moderate   GOALS: Goals reviewed with patient? No  SHORT TERM GOALS: Target date: 01/09/2023. Target date updated to 05/28/2023 for all unmet goals on 03/05/2023.   Patient will be independent with initial home exercise program for self-management of symptoms. Baseline: Initial HEP to be provided at visit 2 as appropriate (12/26/22); Goal status: MET   LONG TERM GOALS: Target date: 03/20/2023. Target date updated to 08/26/23  for all unmet goals on 06/03/2023.   Patient will be independent with a long-term home exercise program for self-management of symptoms.  Baseline: Initial HEP  to be provided at visit 2 as appropriate (12/26/22); participating as able (01/31/2023); patient currently participating as tolerated (03/05/2023; 04/09/2023);   Goal status: In-progress  2.  Patient will demonstrate improved FOTO by equal or greater than 10 points by visit #13 to demonstrate improvement in overall condition and self-reported functional ability.  Baseline: to be tested visit 2 as appropriate (12/26/22); 30 at visit #3 (01/02/2023); 46 at visit #13 (03/05/2023);  Goal status: MET  3.  Patient will demonstrate the ability to ambulate equal or greater than 600 feet with LRAD during the 6 minute walk to improve his household and community mobility.  Baseline: 34 feet with bari-RW (12/26/22); 200 feet with bari-RW (01/31/2023); 500 feet with BRW (03/05/2023); 400 feet with BRW (04/09/2023);  Goal status: In-progress  4.  Patient will complete 5 Time Sit To Stand Test from 19.5 inch surface or lower in equal or less than 15 seconds with no UE support to demonstrate improved transfer ability for improved household and toileting mobility.  Baseline: 23 seconds with heavy B UE support on RW from 19.5 inch plinth. Pain throbbing down the right LE.  (12/26/22); 19 seconds with heavy B UE support on RW from 19.5 inch plinth (03/05/2023); 20 seconds with heavy B UE support on RRW from 18.5 inch plinth. Painful in back of B calves, thighs, and lower back (04/09/2023);  Goal status: In-progress  5.  Patient will report worst pain equal or less than 3/10 with functional activities to improve his ability to complete basic household and community mobility. Baseline: up to 8/10 (12/26/22); reports 3/10 pain (01/31/2023); reports 4/10 pain (02/19/2023); up to 7/10 in the last 2 week s(03/05/2023): up to  7/10 in the last two weeks (04/09/2023); up to 8/10 in the last 2 weeks ( Goal status: In-progress   PLAN:  PT FREQUENCY: 1-2x/week  PT DURATION: 12 weeks  PLANNED INTERVENTIONS: Therapeutic exercises,  Therapeutic activity, Neuromuscular re-education, Balance training, Gait training, Patient/Family education, Self Care, Joint mobilization, Stair training, Orthotic/Fit training, DME instructions, Aquatic Therapy, Dry Needling, Electrical stimulation, Wheelchair mobility training, Spinal mobilization, Cryotherapy, Moist heat, Manual therapy, and Re-evaluation.  PLAN FOR NEXT SESSION:  update HEP as appropriate, neurodynamics, gait training, LE/core/functional strengthening, stretching, and balance as tolerated, education, manual therapy as needed.  Cira Rue, PT, DPT 06/03/2023, 8:55 PM   [place]  [facility] [address] [place], [place] [address] P: [phone] I F: [phone]

## 2023-06-05 ENCOUNTER — Ambulatory Visit: Payer: Medicare Other | Admitting: Physical Therapy

## 2023-06-05 ENCOUNTER — Encounter: Payer: Self-pay | Admitting: Physical Therapy

## 2023-06-05 DIAGNOSIS — M5416 Radiculopathy, lumbar region: Secondary | ICD-10-CM

## 2023-06-05 DIAGNOSIS — M5459 Other low back pain: Secondary | ICD-10-CM | POA: Diagnosis not present

## 2023-06-05 DIAGNOSIS — R29818 Other symptoms and signs involving the nervous system: Secondary | ICD-10-CM

## 2023-06-05 DIAGNOSIS — M6281 Muscle weakness (generalized): Secondary | ICD-10-CM

## 2023-06-05 DIAGNOSIS — R296 Repeated falls: Secondary | ICD-10-CM

## 2023-06-05 DIAGNOSIS — R262 Difficulty in walking, not elsewhere classified: Secondary | ICD-10-CM

## 2023-06-05 NOTE — Therapy (Signed)
OUTPATIENT PHYSICAL THERAPY TREATMENT / PROGRESS NOTE Dates of reporting from 04/09/2023 to 06/05/2023   Patient Name: Travis Fitzpatrick MRN: [id number] DOB:[birth year], 42 y.o., male Today's Date: 06/05/23   END OF SESSION:  PT End of Session - 06/05/23 1924     Visit Number 30    Number of Visits 43    Date for PT Re-Evaluation 08/26/23    Authorization Type MEDICARE PART B reporting period from 04/09/2023    Progress Note Due on Visit 30    PT Start Time 1820    PT Stop Time 1900    PT Time Calculation (min) 40 min    Activity Tolerance Patient limited by pain;Patient limited by fatigue    Behavior During Therapy [facility] for tasks assessed/performed             Past Medical History:  Diagnosis Date   Bowel trouble    urgency   Medical history non-contributory    Urinary urgency    Past Surgical History:  Procedure Laterality Date   BACK SURGERY  2010   CIRCUMCISION     LUMBAR LAMINECTOMY/DECOMPRESSION MICRODISCECTOMY  07/20/2011   Procedure: LUMBAR LAMINECTOMY/DECOMPRESSION MICRODISCECTOMY;  Surgeon: Carmela Hurt;  Location: [facility];  Service: Neurosurgery;  Laterality: N/A;  right thoracotomy with thoracic eight-nine discectomy and fusion   LUMBAR LAMINECTOMY/DECOMPRESSION MICRODISCECTOMY Right 10/06/2021   Procedure: Right Lumbar Four-Five Microdiscectomy, Right Lumbar Five-Sacal One Foraminotomy;  Surgeon: Coletta Memos, MD;  Location: [facility];  Service: Neurosurgery;  Laterality: Right;  3C/RM 21   THORACIC DISCECTOMY  07/16/2012   Procedure: THORACIC DISCECTOMY;  Surgeon: Carmela Hurt, MD;  Location: [facility];  Service: Neurosurgery;  Laterality: Right;  RIGHT Thoracic seven-eight  thoracic diskectomy via thoracotomy by dr Laneta Simmers   THORACIC DISCECTOMY N/A 12/15/2014   Procedure: THORACIC SEVEN TO THORACIC NINE Laminectomy ;  Surgeon: Coletta Memos, MD;  Location: [facility];  Service: Neurosurgery;  Laterality: N/A;   THORACIC DISCECTOMY N/A 06/06/2017    Procedure: LAMINECTOMY THORACIC NINE-TEN;  Surgeon: Coletta Memos, MD;  Location: [facility];  Service: Neurosurgery;  Laterality: N/A;  LAMINECTOMY THORACIC NINE-TEN   THORACOTOMY  07/20/2011   Procedure: THORACOTOMY OPEN FOR SPINE SURGERY;  Surgeon: Norton Blizzard, MD;  Location: [facility];  Service: Vascular;  Laterality: N/A;   THORACOTOMY  07/16/2012   Procedure: THORACOTOMY OPEN FOR SPINE SURGERY;  Surgeon: Alleen Borne, MD;  Location: [facility];  Service: Thoracic;  Laterality: N/A;   Patient Active Problem List   Diagnosis Date Noted   Nerve pain 12/05/2022   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   S/P lumbar spinal fusion 05/25/2022   Synovial cyst of lumbar facet joint 05/25/2022   HNP (herniated nucleus pulposus), lumbar 10/06/2021   Hand pain 06/28/2021   Lumbar facet arthropathy 01/11/2021   Thoracic spondylosis with myelopathy 01/11/2021   Thoracic spinal stenosis 06/06/2017   Stenosis, spinal, thoracic 12/15/2014   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 09/22/2014   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/2011    PCP: Travis Fitzpatrick  REFERRING PROVIDER: Ranelle Oyster, MD  REFERRING DIAG: thoracic disc disease with myelopathy, nerve pain, adhesive arachnoiditis  Rationale for Evaluation and Treatment: Rehabilitation  THERAPY DIAG:  Other low back pain  Other symptoms and signs involving the nervous system  Radiculopathy, lumbar region  Muscle weakness (generalized)  Repeated falls  Difficulty in walking, not elsewhere classified  ONSET DATE:  Suddenly started having weakness 11 years ago, most  recent episode of worsening October 2022, s/p right L4-5 laminotomy, microdiscectomy on 10/06/2021, now s/p L4-5 PLIF by Dr. Franky Macho on 05/25/2022.  PERTINENT HISTORY:  Patient is a 42 y.o. male who presents to outpatient physical therapy with a referral for medical diagnosis thoracic disc disease with myelopathy, nerve pain,  adhesive arachnoiditis. This patient's chief complaints consist of disabling low back and R >L leg pain and weakness with activity, bowel and bladder urgency, incontinence, and retention, leading to the following functional deficits: difficulty with or unable to complete any activity that requires weight bearing, use of B LE, and/or balance including working, household and community mobility, walking, driving, going to family gatherings, avoiding incontinent episodes, playing with daughter, helping around the house, bed mobility, transfers. 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 Franky Macho on 05/25/2022, adhesive arachnoiditis, nerve pain.  Patient denies hx of cancer, stroke, seizures, lung problems, heart problems, diabetes, unexplained weight loss, and osteoporosis.  SUBJECTIVE:                                                                                                                                                                                           SUBJECTIVE STATEMENT:     Patient arrives to treatment session with BRW. He states he was sore like he worked out after last PT session but without a lot of increased pain. He states his pain is about the same today.   PAIN:  NPRS: 3/10 bilateral anterior thighs, back of thighs/calves, right shin, and right hip/ groin.  PRECAUTIONS: Fall  PATIENT GOALS: "To get better, walk, drive again, climb stairs, play with my daughter, help around the house again"  NEXT MD VISIT: 03/13/2023 follow up with Dr. Riley Kill.   OBJECTIVE  (measurements last taken 06/03/2023)   SELF-REPORTED FUNCTION FOTO score: 45/100 (Inflammatory Diseases of the Nervous System) questionnaire)      FUNCTIONAL/BALANCE TESTS: 6 Minute Walk Test: 152 feet with BRW and W/C follow for safety. Unable to continue past 3:05 due to low back and nerve pain as well as progressive LE weakness/stumbling.  Pain up to 4/10.   Five Time Sit to Stand (5TSTS): 15 seconds with heavy B UE support on BRW from 18.5 inch plinth. Painful in back of B calves, thighs, and lower back. Unable to  perform sit <> stand without at least one hand on the RW, and one successful sit <> stand with one hand on walker and one on mat was painful and unstable.    TODAY'S TREATMENT:       Manual therapy: to reduce pain and tissue tension, improve range of motion, neuromodulation, in order to promote improved ability to complete functional activities. SUPINE - LAD through right hip with belt, 3x45 seconds, decreased R hip pain but difficulty with right LE pain in an L4 dermatomal pattern after releasing (although slowly) and trying to get up.  Therapeutic exercise: to centralize symptoms and improve ROM, strength, muscular endurance, and activity tolerance required for successful completion of functional activities.  - ambulation over ground with BRW, 1x200 feet, heavy UE support.  - sit <> stand from 22.5 inch plinth, 3x10 with attempts to use minimal UE support on RW.  - standing lateral sways with hands hovering over RW handles, 3x30-90 seconds  - standing ball toss with light/small pink theraball with plinth adjusted to 22.5 inches behind patient and BRW in front of patient SBA - min A to help with eccentric control while going stand to sit when pt loses balance backwards. Patient with intermittent UE support on BRW as needed. 2nd person tossing/catching ball. 3x to fatigue. - ambulation with BRW and SBA approx 100 feet down ramp from clinic to vehicle.  Pt required multimodal  cuing for proper technique and to facilitate improved neuromuscular control, strength, range of motion, and functional ability resulting in improved performance and form.  PATIENT EDUCATION:  Education details: Exercise purpose/form. Self management techniques.  Person educated: patient Education method: Explanation Education comprehension: verbalized understanding and needs further education  HOME EXERCISE PROGRAM: Verbally:  - seated lumbar flexion stretch with head down and one LE extended using rollator, 1x10 each side.  - curl up with legs elevated, 5x15 seconds - sidelying open book 1x10 each side  ASSESSMENT:  CLINICAL IMPRESSION:     Patient has attended 30 physical therapy sessions since starting current episode on 03/12/2023. He continues to have fluctuating symptoms that frequently keep him from attending PT. He continues to show strong motivation and participation during clinic visits, and is severely limited in his exercise/intervention tolerance and basic functional activities by neurogenic pain and weakness. Patient's condition is chronic and degenerative, and continued PT is medically necessary to help prevent decline in function and need for higher level of care. Plan to continue with visits 1-2/week as tolerated. Patient would benefit from continued management of limiting condition by skilled physical therapist to address remaining impairments and functional limitations to work towards stated goals and return to PLOF or maximal functional independence.   OBJECTIVE IMPAIRMENTS: Abnormal gait, decreased activity tolerance, decreased balance, decreased coordination, decreased endurance, decreased knowledge of condition, decreased knowledge of use of DME, decreased mobility, difficulty walking, decreased ROM, decreased strength, increased edema, impaired perceived functional ability, increased muscle spasms, impaired flexibility, impaired sensation, impaired tone, improper body  mechanics, postural dysfunction, obesity, and pain.   ACTIVITY LIMITATIONS: carrying, lifting, bending, standing, squatting, stairs, transfers, bed mobility, continence, bathing, toileting, dressing, hygiene/grooming, locomotion level, and caring for others  PARTICIPATION LIMITATIONS: meal prep, cleaning, laundry, interpersonal relationship, driving, shopping, community activity, occupation, yard work, and   difficulty with or unable to complete any activity that requires weight bearing, use of B LE, and/or balance including working, household and community mobility, walking, driving, going to family gatherings, avoiding incontinent episodes, playing with daughter, helping around the house,  bed mobility, transfers  PERSONAL FACTORS: Past/current experiences, Time since onset of injury/illness/exacerbation, and 3+ comorbidities:   3 thoracic spine surgeries, thoracic MRI notes "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 Franky Macho on 05/25/2022, adhesive arachnoiditis, nerve pain are also affecting patient's functional outcome.   REHAB POTENTIAL: Fair due to severity and nature of condition.   CLINICAL DECISION MAKING: Evolving/moderate complexity  EVALUATION COMPLEXITY: Moderate   GOALS: Goals reviewed with patient? No  SHORT TERM GOALS: Target date: 01/09/2023. Target date updated to 05/28/2023 for all unmet goals on 03/05/2023.   Patient will be independent with initial home exercise program for self-management of symptoms. Baseline: Initial HEP to be provided at visit 2 as appropriate (12/26/22); Goal status: MET   LONG TERM GOALS: Target date: 03/20/2023. Target date updated to  08/26/23  for all unmet goals on 06/03/2023.   Patient will be independent with a long-term home exercise program for self-management of symptoms.  Baseline: Initial HEP to be provided at visit 2 as appropriate (12/26/22); participating as able (01/31/2023); patient currently participating as tolerated (03/05/2023; 04/09/2023; 06/03/2023);   Goal status: In-progress  2.  Patient will demonstrate improved FOTO by equal or greater than 10 points by visit #13 to demonstrate improvement in overall condition and self-reported functional ability.  Baseline: to be tested visit 2 as appropriate (12/26/22); 30 at visit #3 (01/02/2023); 46 at visit #13 (03/05/2023); 45 at visit #29 (06/03/2023); Goal status: MET  3.  Patient will demonstrate the ability to ambulate equal or greater than 600 feet with LRAD during the 6 minute walk to improve his household and community mobility.  Baseline: 34 feet with bari-RW (12/26/22); 200 feet with bari-RW (01/31/2023); 500 feet with BRW (03/05/2023); 400 feet with BRW (04/09/2023); 152 feet with BRW and W/C follow for safety (06/03/2023);  Goal status: Ongoing  4.  Patient will complete 5 Time Sit To Stand Test from 19.5 inch surface or lower in equal or less than 15 seconds with no UE support to demonstrate improved transfer ability for improved household and toileting mobility.  Baseline: 23 seconds with heavy B UE support on RW from 19.5 inch plinth. Pain throbbing down the right LE.  (12/26/22); 19 seconds with heavy B UE support on RW from 19.5 inch plinth (03/05/2023); 20 seconds with heavy B UE support on RRW from 18.5 inch plinth. Painful in back of B calves, thighs, and lower back (04/09/2023); 15 seconds with heavy B UE support on BRW from 18.5 inch plinth. Painful in back of B calves, thighs, and lower back (06/03/2023);  Goal status: In-progress  5.  Patient will report worst pain equal or less than 3/10 with functional activities to improve his ability to complete  basic household and community mobility. Baseline: up to 8/10 (12/26/22); reports 3/10 pain (01/31/2023); reports 4/10 pain (02/19/2023); up to 7/10 in the last 2 week s(03/05/2023): up to  7/10 in the last two weeks (04/09/2023); up to 8/10 in the last 2 weeks (06/03/2023);  Goal status: Ongoing   PLAN:  PT FREQUENCY: 1-2x/week  PT DURATION: 12 weeks  PLANNED INTERVENTIONS: Therapeutic exercises, Therapeutic activity, Neuromuscular re-education, Balance training, Gait training, Patient/Family education, Self Care, Joint mobilization, Stair training, Orthotic/Fit training, DME instructions, Aquatic  Therapy, Dry Needling, Electrical stimulation, Wheelchair mobility training, Spinal mobilization, Cryotherapy, Moist heat, Manual therapy, and Re-evaluation.  PLAN FOR NEXT SESSION:  update HEP as appropriate, neurodynamics, gait training, LE/core/functional strengthening, stretching, and balance as tolerated, education, manual therapy as needed.  Cira Rue, PT, DPT 06/05/2023, 7:35 PM   [facility] [address] [place], [place] [address] P: [phone] I F: [phone]

## 2023-06-10 ENCOUNTER — Ambulatory Visit: Payer: Medicare Other | Admitting: Physical Therapy

## 2023-06-12 ENCOUNTER — Ambulatory Visit: Payer: Medicare Other | Admitting: Physical Therapy

## 2023-06-12 ENCOUNTER — Encounter: Payer: Self-pay | Admitting: Physical Therapy

## 2023-06-12 ENCOUNTER — Encounter: Payer: Medicare Other | Admitting: Physical Medicine & Rehabilitation

## 2023-06-12 DIAGNOSIS — M5459 Other low back pain: Secondary | ICD-10-CM

## 2023-06-12 DIAGNOSIS — R262 Difficulty in walking, not elsewhere classified: Secondary | ICD-10-CM

## 2023-06-12 DIAGNOSIS — M5416 Radiculopathy, lumbar region: Secondary | ICD-10-CM

## 2023-06-12 DIAGNOSIS — M6281 Muscle weakness (generalized): Secondary | ICD-10-CM

## 2023-06-12 DIAGNOSIS — R29818 Other symptoms and signs involving the nervous system: Secondary | ICD-10-CM

## 2023-06-12 DIAGNOSIS — R296 Repeated falls: Secondary | ICD-10-CM

## 2023-06-12 NOTE — Therapy (Signed)
OUTPATIENT PHYSICAL THERAPY TREATMENT  Patient Name: Travis Fitzpatrick MRN: [id number] DOB:08/21/1980, 42 y.o., male Today's Date: 06/12/23   END OF SESSION:  PT End of Session - 06/12/23 2024     Visit Number 31    Number of Visits 43    Date for PT Re-Evaluation 08/26/23    Authorization Type MEDICARE PART B reporting period from 06/05/2023    Progress Note Due on Visit 40    PT Start Time 1820    PT Stop Time 1900    PT Time Calculation (min) 40 min    Activity Tolerance Patient limited by pain    Behavior During Therapy [facility] for tasks assessed/performed              Past Medical History:  Diagnosis Date   Bowel trouble    urgency   Medical history non-contributory    Urinary urgency    Past Surgical History:  Procedure Laterality Date   BACK SURGERY  2010   CIRCUMCISION     LUMBAR LAMINECTOMY/DECOMPRESSION MICRODISCECTOMY  07/20/2011   Procedure: LUMBAR LAMINECTOMY/DECOMPRESSION MICRODISCECTOMY;  Surgeon: Carmela Hurt;  Location: [facility];  Service: Neurosurgery;  Laterality: N/A;  right thoracotomy with thoracic eight-nine discectomy and fusion   LUMBAR LAMINECTOMY/DECOMPRESSION MICRODISCECTOMY Right 10/06/2021   Procedure: Right Lumbar Four-Five Microdiscectomy, Right Lumbar Five-Sacal One Foraminotomy;  Surgeon: Coletta Memos, MD;  Location: [facility];  Service: Neurosurgery;  Laterality: Right;  3C/RM 21   THORACIC DISCECTOMY  07/16/2012   Procedure: THORACIC DISCECTOMY;  Surgeon: Carmela Hurt, MD;  Location: [facility];  Service: Neurosurgery;  Laterality: Right;  RIGHT Thoracic seven-eight  thoracic diskectomy via thoracotomy by dr Laneta Simmers   THORACIC DISCECTOMY N/A 12/15/2014   Procedure: THORACIC SEVEN TO THORACIC NINE Laminectomy ;  Surgeon: Coletta Memos, MD;  Location: [facility];  Service: Neurosurgery;  Laterality: N/A;   THORACIC DISCECTOMY N/A 06/06/2017   Procedure: LAMINECTOMY THORACIC NINE-TEN;  Surgeon: Coletta Memos, MD;  Location: [facility];   Service: Neurosurgery;  Laterality: N/A;  LAMINECTOMY THORACIC NINE-TEN   THORACOTOMY  07/20/2011   Procedure: THORACOTOMY OPEN FOR SPINE SURGERY;  Surgeon: Norton Blizzard, MD;  Location: [facility];  Service: Vascular;  Laterality: N/A;   THORACOTOMY  07/16/2012   Procedure: THORACOTOMY OPEN FOR SPINE SURGERY;  Surgeon: Alleen Borne, MD;  Location: [facility];  Service: Thoracic;  Laterality: N/A;   Patient Active Problem List   Diagnosis Date Noted   Nerve pain 12/05/2022   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   S/P lumbar spinal fusion 05/25/2022   Synovial cyst of lumbar facet joint 05/25/2022   HNP (herniated nucleus pulposus), lumbar 10/06/2021   Hand pain 06/28/2021   Lumbar facet arthropathy 01/11/2021   Thoracic spondylosis with myelopathy 01/11/2021   Thoracic spinal stenosis 06/06/2017   Stenosis, spinal, thoracic 12/15/2014   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 09/22/2014   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/2011    PCP: Nira Retort  REFERRING PROVIDER: Ranelle Oyster, MD  REFERRING DIAG: thoracic disc disease with myelopathy, nerve pain, adhesive arachnoiditis  Rationale for Evaluation and Treatment: Rehabilitation  THERAPY DIAG:  Other low back pain  Other symptoms and signs involving the nervous system  Radiculopathy, lumbar region  Muscle weakness (generalized)  Repeated falls  Difficulty in walking, not elsewhere classified  ONSET DATE:  Suddenly started having weakness 11 years ago, most recent episode of worsening October 2022, s/p right L4-5 laminotomy, microdiscectomy on 10/06/2021,  now s/p L4-5 PLIF by Dr. Franky Macho on 05/25/2022.  PERTINENT HISTORY:  Patient is a 42 y.o. male who presents to outpatient physical therapy with a referral for medical diagnosis thoracic disc disease with myelopathy, nerve pain, adhesive arachnoiditis. This patient's chief complaints consist of disabling low back and R >L  leg pain and weakness with activity, bowel and bladder urgency, incontinence, and retention, leading to the following functional deficits: difficulty with or unable to complete any activity that requires weight bearing, use of B LE, and/or balance including working, household and community mobility, walking, driving, going to family gatherings, avoiding incontinent episodes, playing with daughter, helping around the house, bed mobility, transfers. 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 Franky Macho on 05/25/2022, adhesive arachnoiditis, nerve pain.  Patient denies hx of cancer, stroke, seizures, lung problems, heart problems, diabetes, unexplained weight loss, and osteoporosis.  SUBJECTIVE:                                                                                                                                                                                           SUBJECTIVE STATEMENT:     Patient arrives to treatment session with BRW. He states he was not good after last PT session. He states he went home and layed down and "it was over." He states his pain/weakness was flared up after last PT session so that he could not come to PT earlier this week.   PAIN:  NPRS: 4/10 bilateral anterior thighs, back of thighs/calves, right shin, and right hip/ groin.  PRECAUTIONS: Fall  PATIENT GOALS: "To get better, walk, drive again, climb stairs, play with my daughter, help around the house again"  NEXT MD VISIT: 03/13/2023 follow up with Dr. Riley Kill.   OBJECTIVE   Right thigh proximal  circumference: 34"   TODAY'S TREATMENT:       Manual therapy: to reduce pain and tissue tension, improve range of motion, neuromodulation, in order to promote improved ability to complete functional activities. SUPINE - LAD through right hip with belt, 3x45 seconds, decreased R hip pain but difficulty with right LE pain in an L4 dermatomal pattern after releasing (although slowly) and trying to get up.  Therapeutic exercise: to centralize symptoms and improve ROM, strength, muscular endurance, and activity tolerance required for successful  completion of functional activities.  - ambulation over ground with BRW, 1x200 feet, heavy UE support.  - seated LAQ,  R: 1x10 AROM, 1x10 with 5#AW, 3x10 with 10#AW L: 3x10 with 15#AW.   - standing lateral sways with hands hovering over RW handles, 1x45 seconds (too painful in right LE, so discontinued). - seated bilateral shoulder front raise to 90 degrees, 3x10 with 7#DB in each hand (feels loading in the back). - ambulation with BRW and SBA approx 100 feet down ramp from clinic to vehicle.  Pt required multimodal cuing for proper technique and to facilitate improved neuromuscular control, strength, range of motion, and functional ability resulting in improved performance and form.  PATIENT EDUCATION:  Education details: Exercise purpose/form. Self management techniques.  Person educated: patient Education method: Explanation Education comprehension: verbalized understanding and needs further education  HOME EXERCISE PROGRAM: Verbally:  - seated lumbar flexion stretch with head down and one LE extended using rollator, 1x10 each side.  - curl up with legs elevated, 5x15 seconds - sidelying open book 1x10 each side  ASSESSMENT:  CLINICAL IMPRESSION:     Patient arrives reporting exacerbation of pain/weakness after last PT session that lasted through the weekend and caused him to miss his first appointment this week. Exacerbation likely due to  prolonged standing at last session when he was participating in the ball toss exercise that he really enjoyed, so he stood longer than usual despite increasing pain. Today's session continued to focus on pain control for right hip pain and functional, trunk, and LE strengthening he could tolerate. Patient was not able to do as much standing exercise today but tolerated LAQ and seated front raises to load his posterior core well. He reported no increase in symptoms by end of session. Patient's right proximal thigh was measured today with consideration of use of blood flow restriction training, but it was 5 inches greater diameter than a potentially available XL cuff size, so it is ruled out as an option at this point.Marland Kitchen He continues to have severe pain over the R knee when/after releasing long axis distraction which is used to help control right hip pain. Plan to update HEP next session. Patient would benefit from continued management of limiting condition by skilled physical therapist to address remaining impairments and functional limitations to work towards stated goals and return to PLOF or maximal functional independence.   OBJECTIVE IMPAIRMENTS: Abnormal gait, decreased activity tolerance, decreased balance, decreased coordination, decreased endurance, decreased knowledge of condition, decreased knowledge of use of DME, decreased mobility, difficulty walking, decreased ROM, decreased strength, increased edema, impaired perceived functional ability, increased muscle spasms, impaired flexibility, impaired sensation, impaired tone, improper body mechanics, postural dysfunction, obesity, and pain.   ACTIVITY LIMITATIONS: carrying, lifting, bending, standing, squatting, stairs, transfers, bed mobility, continence, bathing, toileting, dressing, hygiene/grooming, locomotion level, and caring for others  PARTICIPATION LIMITATIONS: meal prep, cleaning, laundry, interpersonal relationship, driving, shopping,  community activity, occupation, yard work, and   difficulty with or unable to complete any activity that requires weight bearing, use of B LE, and/or balance including working, household and community mobility, walking, driving, going to family gatherings, avoiding incontinent episodes, playing with daughter, helping around the house, bed mobility, transfers  PERSONAL FACTORS: Past/current experiences, Time since onset of injury/illness/exacerbation, and 3+ comorbidities:   3 thoracic spine surgeries, thoracic MRI notes "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 Franky Macho on 05/25/2022, adhesive arachnoiditis, nerve pain are also affecting patient's functional outcome.   REHAB POTENTIAL: Fair due to severity and nature of condition.   CLINICAL DECISION MAKING: Evolving/moderate complexity  EVALUATION COMPLEXITY: Moderate   GOALS: Goals reviewed with patient? No  SHORT TERM GOALS: Target date: 01/09/2023. Target date updated to 05/28/2023 for all unmet goals on 03/05/2023.   Patient will be independent with initial home exercise program for self-management of symptoms. Baseline: Initial HEP to be provided at visit 2 as appropriate (12/26/22); Goal status: MET   LONG TERM GOALS: Target date: 03/20/2023. Target date updated to 08/26/23  for all unmet goals on 06/03/2023.   Patient will be independent with a long-term home exercise program for self-management of symptoms.  Baseline: Initial HEP to be provided at visit 2 as appropriate (12/26/22); participating as able (01/31/2023); patient currently participating as tolerated (03/05/2023; 04/09/2023; 06/03/2023);   Goal status:  In-progress  2.  Patient will demonstrate improved FOTO by equal or greater than 10 points by visit #13 to demonstrate improvement in overall condition and self-reported functional ability.  Baseline: to be tested visit 2 as appropriate (12/26/22); 30 at visit #3 (01/02/2023); 46 at visit #13 (03/05/2023); 45 at visit #29 (06/03/2023); Goal status: MET  3.  Patient will demonstrate the ability to ambulate equal or greater than 600 feet with LRAD during the 6 minute walk to improve his household and community mobility.  Baseline: 34 feet with bari-RW (12/26/22); 200 feet with bari-RW (01/31/2023); 500 feet with BRW (03/05/2023); 400 feet with BRW (04/09/2023); 152 feet with BRW and W/C follow for safety (06/03/2023);  Goal status: Ongoing  4.  Patient will complete 5 Time Sit To Stand Test from 19.5 inch surface or lower in equal or less than 15 seconds with no UE support to demonstrate improved transfer ability for improved household and toileting mobility.  Baseline: 23 seconds with heavy B UE support on RW from 19.5 inch plinth. Pain throbbing down the right LE.  (12/26/22); 19 seconds with heavy B UE support on RW from 19.5 inch plinth (03/05/2023); 20 seconds with heavy B UE support on RRW from 18.5 inch plinth. Painful in back of B calves, thighs, and lower back (04/09/2023); 15 seconds with heavy B UE support on BRW from 18.5 inch plinth. Painful in back of B calves, thighs, and lower back (06/03/2023);  Goal status: In-progress  5.  Patient will report worst pain equal or less than 3/10 with functional activities to improve his ability to complete basic household and community mobility. Baseline: up to 8/10 (12/26/22); reports 3/10 pain (01/31/2023); reports 4/10 pain (02/19/2023); up to 7/10 in the last 2 week s(03/05/2023): up to  7/10 in the last two weeks (04/09/2023); up to 8/10 in the last 2 weeks (06/03/2023);  Goal status: Ongoing   PLAN:  PT FREQUENCY: 1-2x/week  PT DURATION: 12  weeks  PLANNED INTERVENTIONS: Therapeutic exercises, Therapeutic activity, Neuromuscular re-education, Balance training, Gait training, Patient/Family education, Self Care, Joint mobilization, Stair training, Orthotic/Fit training, DME instructions, Aquatic Therapy, Dry Needling, Electrical stimulation, Wheelchair mobility training, Spinal mobilization, Cryotherapy, Moist heat, Manual therapy, and Re-evaluation.  PLAN FOR NEXT SESSION:  update HEP as appropriate, neurodynamics, gait training, LE/core/functional strengthening, stretching, and balance as tolerated, education, manual therapy as needed.  Consider updating HEP, phasing out LAD  Cira Rue,  PT, DPT 06/12/2023, 8:32 PM   [facility] [address] [place], [place] [address] P: [phone] I F: [phone]

## 2023-06-17 ENCOUNTER — Ambulatory Visit: Payer: Medicare Other | Attending: Physical Medicine & Rehabilitation | Admitting: Physical Therapy

## 2023-06-17 ENCOUNTER — Encounter: Payer: Self-pay | Admitting: Physical Therapy

## 2023-06-17 DIAGNOSIS — R29818 Other symptoms and signs involving the nervous system: Secondary | ICD-10-CM | POA: Diagnosis present

## 2023-06-17 DIAGNOSIS — M5459 Other low back pain: Secondary | ICD-10-CM | POA: Insufficient documentation

## 2023-06-17 DIAGNOSIS — R262 Difficulty in walking, not elsewhere classified: Secondary | ICD-10-CM | POA: Insufficient documentation

## 2023-06-17 DIAGNOSIS — M6281 Muscle weakness (generalized): Secondary | ICD-10-CM | POA: Insufficient documentation

## 2023-06-17 DIAGNOSIS — M5416 Radiculopathy, lumbar region: Secondary | ICD-10-CM | POA: Diagnosis present

## 2023-06-17 DIAGNOSIS — R296 Repeated falls: Secondary | ICD-10-CM | POA: Diagnosis present

## 2023-06-17 NOTE — Therapy (Signed)
OUTPATIENT PHYSICAL THERAPY TREATMENT  Patient Name: Travis Fitzpatrick MRN: [id number] DOB:06-10-1981, 42 y.o., male Today's Date: 06/18/23   END OF SESSION:  PT End of Session - 06/17/23 1822     Visit Number 32    Number of Visits 43    Date for PT Re-Evaluation 08/26/23    Authorization Type MEDICARE PART B reporting period from 06/05/2023    Progress Note Due on Visit 40    PT Start Time 1822    PT Stop Time 1900    PT Time Calculation (min) 38 min    Activity Tolerance Patient limited by pain    Behavior During Therapy [facility] for tasks assessed/performed               Past Medical History:  Diagnosis Date   Bowel trouble    urgency   Medical history non-contributory    Urinary urgency    Past Surgical History:  Procedure Laterality Date   BACK SURGERY  2010   CIRCUMCISION     LUMBAR LAMINECTOMY/DECOMPRESSION MICRODISCECTOMY  07/20/2011   Procedure: LUMBAR LAMINECTOMY/DECOMPRESSION MICRODISCECTOMY;  Surgeon: Carmela Hurt;  Location: [facility];  Service: Neurosurgery;  Laterality: N/A;  right thoracotomy with thoracic eight-nine discectomy and fusion   LUMBAR LAMINECTOMY/DECOMPRESSION MICRODISCECTOMY Right 10/06/2021   Procedure: Right Lumbar Four-Five Microdiscectomy, Right Lumbar Five-Sacal One Foraminotomy;  Surgeon: Coletta Memos, MD;  Location: [facility];  Service: Neurosurgery;  Laterality: Right;  3C/RM 21   THORACIC DISCECTOMY  07/16/2012   Procedure: THORACIC DISCECTOMY;  Surgeon: Carmela Hurt, MD;  Location: [facility];  Service: Neurosurgery;  Laterality: Right;  RIGHT Thoracic seven-eight  thoracic diskectomy via thoracotomy by dr Laneta Simmers   THORACIC DISCECTOMY N/A 12/15/2014   Procedure: THORACIC SEVEN TO THORACIC NINE Laminectomy ;  Surgeon: Coletta Memos, MD;  Location: [facility];  Service: Neurosurgery;  Laterality: N/A;   THORACIC DISCECTOMY N/A 06/06/2017   Procedure: LAMINECTOMY THORACIC NINE-TEN;  Surgeon: Coletta Memos, MD;  Location: [facility];   Service: Neurosurgery;  Laterality: N/A;  LAMINECTOMY THORACIC NINE-TEN   THORACOTOMY  07/20/2011   Procedure: THORACOTOMY OPEN FOR SPINE SURGERY;  Surgeon: Norton Blizzard, MD;  Location: [facility];  Service: Vascular;  Laterality: N/A;   THORACOTOMY  07/16/2012   Procedure: THORACOTOMY OPEN FOR SPINE SURGERY;  Surgeon: Alleen Borne, MD;  Location: [facility];  Service: Thoracic;  Laterality: N/A;   Patient Active Problem List   Diagnosis Date Noted   Nerve pain 12/05/2022   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   S/P lumbar spinal fusion 05/25/2022   Synovial cyst of lumbar facet joint 05/25/2022   HNP (herniated nucleus pulposus), lumbar 10/06/2021   Hand pain 06/28/2021   Lumbar facet arthropathy 01/11/2021   Thoracic spondylosis with myelopathy 01/11/2021   Thoracic spinal stenosis 06/06/2017   Stenosis, spinal, thoracic 12/15/2014   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 09/22/2014   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/2011    PCP: Nira Retort  REFERRING PROVIDER: Ranelle Oyster, MD  REFERRING DIAG: thoracic disc disease with myelopathy, nerve pain, adhesive arachnoiditis  Rationale for Evaluation and Treatment: Rehabilitation  THERAPY DIAG:  Other low back pain  Other symptoms and signs involving the nervous system  Radiculopathy, lumbar region  Muscle weakness (generalized)  Repeated falls  Difficulty in walking, not elsewhere classified  ONSET DATE:  Suddenly started having weakness 11 years ago, most recent episode of worsening October 2022, s/p right L4-5 laminotomy, microdiscectomy on  10/06/2021, now s/p L4-5 PLIF by Dr. Franky Macho on 05/25/2022.  PERTINENT HISTORY:  Patient is a 42 y.o. male who presents to outpatient physical therapy with a referral for medical diagnosis thoracic disc disease with myelopathy, nerve pain, adhesive arachnoiditis. This patient's chief complaints consist of disabling low back and R >L  leg pain and weakness with activity, bowel and bladder urgency, incontinence, and retention, leading to the following functional deficits: difficulty with or unable to complete any activity that requires weight bearing, use of B LE, and/or balance including working, household and community mobility, walking, driving, going to family gatherings, avoiding incontinent episodes, playing with daughter, helping around the house, bed mobility, transfers. 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 Franky Macho on 05/25/2022, adhesive arachnoiditis, nerve pain.  Patient denies hx of cancer, stroke, seizures, lung problems, heart problems, diabetes, unexplained weight loss, and osteoporosis.  SUBJECTIVE:                                                                                                                                                                                           SUBJECTIVE STATEMENT:     Patient arrives to treatment session with BRW. He states his lower back and right hip/groin area is hurting more today. He states the back of his thighs hurt but not as much as usual and his calf on the right hurts. He states he felt sore after last PT session.   PAIN:  NPRS: 4/10 bilateral posterior thighs, right calf right shin, and right hip/groin.  PRECAUTIONS: Fall  PATIENT GOALS: "To get better, walk, drive again, climb stairs, play with my daughter, help around the house again"  NEXT MD VISIT: 03/13/2023 follow up with Dr. Riley Kill.   OBJECTIVE   TODAY'S TREATMENT:       Manual  therapy: to reduce pain and tissue tension, improve range of motion, neuromodulation, in order to promote improved ability to complete functional activities. HOOKLYING - attempted LAD through right hip with belt, discontinued due to too much pain at right anterior lower leg.  - caudal glide with belt through right hip joint, grade III-IV, 3x30-45 seconds.   Therapeutic exercise: to centralize symptoms and improve ROM, strength, muscular endurance, and activity tolerance required for successful completion of functional activities.  - ambulation over  ground with BRW, 1x300 feet, heavy UE support.  - seated LAQ,  R: 3x10 with 17.5#AW L: 3x10 with 20#AW - seated bilateral shoulder front raise to 90 degrees, 3x10 with 7#DB in each hand (feels loading in the back, medium intensity). - standing lateral sways with hands hovering over RW handles, 1x45 seconds (too painful in right LE, so discontinued). - seated lumbar flexion roll out 3 min with self selected pace - ambulation with BRW and SBA approx 100 feet down ramp from clinic to vehicle.  Pt required multimodal cuing for proper technique and to facilitate improved neuromuscular control, strength, range of motion, and functional ability resulting in improved performance and form.  PATIENT EDUCATION:  Education details: Exercise purpose/form. Self management techniques.  Person educated: patient Education method: Explanation Education comprehension: verbalized understanding and needs further education  HOME EXERCISE PROGRAM: Verbally:  - seated lumbar flexion stretch with head down and one LE extended using rollator, 1x10 each side.  - curl up with legs elevated, 5x15 seconds - sidelying open book 1x10 each side  ASSESSMENT:  CLINICAL IMPRESSION:     Patient arrives reporting better tolerance to last PT session. He had difficulty tolerating LAD through right hip today, so it was changed to a caudal glide in hip flexion which much better  tolerance. Patient had better tolerance for interventions today and seemed to have more lasting right hip relief following mobilization. He did lose his balance backwards while sitting on plinth at the end of the session when he was tossing large theraball over head for fun. He required min A to sit back up and found the position uncomfortable. He was able to ambulate to his vehicle afterwards with SBA. Plan to continue with hip mobs next session and continue working on functional mobility and balance as tolerated. Patient would benefit from continued management of limiting condition by skilled physical therapist to address remaining impairments and functional limitations to work towards stated goals and return to PLOF or maximal functional independence.   OBJECTIVE IMPAIRMENTS: Abnormal gait, decreased activity tolerance, decreased balance, decreased coordination, decreased endurance, decreased knowledge of condition, decreased knowledge of use of DME, decreased mobility, difficulty walking, decreased ROM, decreased strength, increased edema, impaired perceived functional ability, increased muscle spasms, impaired flexibility, impaired sensation, impaired tone, improper body mechanics, postural dysfunction, obesity, and pain.   ACTIVITY LIMITATIONS: carrying, lifting, bending, standing, squatting, stairs, transfers, bed mobility, continence, bathing, toileting, dressing, hygiene/grooming, locomotion level, and caring for others  PARTICIPATION LIMITATIONS: meal prep, cleaning, laundry, interpersonal relationship, driving, shopping, community activity, occupation, yard work, and   difficulty with or unable to complete any activity that requires weight bearing, use of B LE, and/or balance including working, household and community mobility, walking, driving, going to family gatherings, avoiding incontinent episodes, playing with daughter, helping around the house, bed mobility, transfers  PERSONAL FACTORS:  Past/current experiences, Time since onset of injury/illness/exacerbation, and 3+ comorbidities:   3 thoracic spine surgeries, thoracic MRI notes "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 Franky Macho on 05/25/2022, adhesive arachnoiditis, nerve pain are also affecting patient's functional outcome.   REHAB POTENTIAL: Fair due to severity and nature of condition.   CLINICAL DECISION MAKING: Evolving/moderate complexity  EVALUATION COMPLEXITY: Moderate  GOALS: Goals reviewed with patient? No  SHORT TERM GOALS: Target date: 01/09/2023. Target date updated to 05/28/2023 for all unmet goals on 03/05/2023.   Patient will be independent with initial home exercise program for self-management of symptoms. Baseline: Initial HEP to be provided at visit 2 as appropriate (12/26/22); Goal status: MET   LONG TERM GOALS: Target date: 03/20/2023. Target date updated to 08/26/23  for all unmet goals on 06/03/2023.   Patient will be independent with a long-term home exercise program for self-management of symptoms.  Baseline: Initial HEP to be provided at visit 2 as appropriate (12/26/22); participating as able (01/31/2023); patient currently participating as tolerated (03/05/2023; 04/09/2023; 06/03/2023);   Goal status: In-progress  2.  Patient will demonstrate improved FOTO by equal or greater than 10 points by visit #13 to demonstrate improvement in overall condition and self-reported functional ability.  Baseline: to be tested visit 2 as appropriate (12/26/22); 30 at visit #3 (01/02/2023); 46 at visit #13 (03/05/2023); 45 at visit #29 (06/03/2023); Goal status: MET  3.  Patient will demonstrate the  ability to ambulate equal or greater than 600 feet with LRAD during the 6 minute walk to improve his household and community mobility.  Baseline: 34 feet with bari-RW (12/26/22); 200 feet with bari-RW (01/31/2023); 500 feet with BRW (03/05/2023); 400 feet with BRW (04/09/2023); 152 feet with BRW and W/C follow for safety (06/03/2023);  Goal status: Ongoing  4.  Patient will complete 5 Time Sit To Stand Test from 19.5 inch surface or lower in equal or less than 15 seconds with no UE support to demonstrate improved transfer ability for improved household and toileting mobility.  Baseline: 23 seconds with heavy B UE support on RW from 19.5 inch plinth. Pain throbbing down the right LE.  (12/26/22); 19 seconds with heavy B UE support on RW from 19.5 inch plinth (03/05/2023); 20 seconds with heavy B UE support on RRW from 18.5 inch plinth. Painful in back of B calves, thighs, and lower back (04/09/2023); 15 seconds with heavy B UE support on BRW from 18.5 inch plinth. Painful in back of B calves, thighs, and lower back (06/03/2023);  Goal status: In-progress  5.  Patient will report worst pain equal or less than 3/10 with functional activities to improve his ability to complete basic household and community mobility. Baseline: up to 8/10 (12/26/22); reports 3/10 pain (01/31/2023); reports 4/10 pain (02/19/2023); up to 7/10 in the last 2 week s(03/05/2023): up to  7/10 in the last two weeks (04/09/2023); up to 8/10 in the last 2 weeks (06/03/2023);  Goal status: Ongoing   PLAN:  PT FREQUENCY: 1-2x/week  PT DURATION: 12 weeks  PLANNED INTERVENTIONS: Therapeutic exercises, Therapeutic activity, Neuromuscular re-education, Balance training, Gait training, Patient/Family education, Self Care, Joint mobilization, Stair training, Orthotic/Fit training, DME instructions, Aquatic Therapy, Dry Needling, Electrical stimulation, Wheelchair mobility training, Spinal mobilization, Cryotherapy, Moist heat, Manual therapy, and  Re-evaluation.  PLAN FOR NEXT SESSION:  update HEP as appropriate, neurodynamics, gait training, LE/core/functional strengthening, stretching, and balance as tolerated, education, manual therapy as needed.  Consider updating HEP  Cira Rue, PT, DPT 06/18/2023, 7:21 PM   [facility] [address] [place], [place] [address] P: [phone] I F: [phone]

## 2023-06-18 ENCOUNTER — Encounter: Payer: Self-pay | Admitting: Physical Therapy

## 2023-06-19 ENCOUNTER — Ambulatory Visit: Payer: Medicare Other | Admitting: Physical Therapy

## 2023-06-24 ENCOUNTER — Encounter: Payer: Self-pay | Admitting: Physical Medicine & Rehabilitation

## 2023-06-24 ENCOUNTER — Ambulatory Visit: Payer: Medicare Other | Admitting: Physical Therapy

## 2023-06-24 DIAGNOSIS — R29818 Other symptoms and signs involving the nervous system: Secondary | ICD-10-CM

## 2023-06-24 DIAGNOSIS — R296 Repeated falls: Secondary | ICD-10-CM

## 2023-06-24 DIAGNOSIS — M5459 Other low back pain: Secondary | ICD-10-CM

## 2023-06-24 DIAGNOSIS — R262 Difficulty in walking, not elsewhere classified: Secondary | ICD-10-CM

## 2023-06-24 DIAGNOSIS — G039 Meningitis, unspecified: Secondary | ICD-10-CM

## 2023-06-24 DIAGNOSIS — M5104 Intervertebral disc disorders with myelopathy, thoracic region: Secondary | ICD-10-CM

## 2023-06-24 DIAGNOSIS — M792 Neuralgia and neuritis, unspecified: Secondary | ICD-10-CM

## 2023-06-24 DIAGNOSIS — M6281 Muscle weakness (generalized): Secondary | ICD-10-CM

## 2023-06-24 DIAGNOSIS — M5416 Radiculopathy, lumbar region: Secondary | ICD-10-CM

## 2023-06-24 NOTE — Therapy (Signed)
OUTPATIENT PHYSICAL THERAPY TREATMENT  Patient Name: Travis Fitzpatrick MRN: [id number] DOB:[birth year], 42 y.o., male Today's Date: 06/25/23   END OF SESSION:  PT End of Session - 06/24/23 1824     Visit Number 33    Number of Visits 43    Date for PT Re-Evaluation 08/26/23    Authorization Type MEDICARE PART B reporting period from 06/05/2023    Progress Note Due on Visit 40    PT Start Time 1823    PT Stop Time 1901    PT Time Calculation (min) 38 min    Activity Tolerance Patient limited by pain    Behavior During Therapy [facility] for tasks assessed/performed             Past Medical History:  Diagnosis Date   Bowel trouble    urgency   Medical history non-contributory    Urinary urgency    Past Surgical History:  Procedure Laterality Date   BACK SURGERY  2010   CIRCUMCISION     LUMBAR LAMINECTOMY/DECOMPRESSION MICRODISCECTOMY  07/20/2011   Procedure: LUMBAR LAMINECTOMY/DECOMPRESSION MICRODISCECTOMY;  Surgeon: Carmela Hurt;  Location: [facility];  Service: Neurosurgery;  Laterality: N/A;  right thoracotomy with thoracic eight-nine discectomy and fusion   LUMBAR LAMINECTOMY/DECOMPRESSION MICRODISCECTOMY Right 10/06/2021   Procedure: Right Lumbar Four-Five Microdiscectomy, Right Lumbar Five-Sacal One Foraminotomy;  Surgeon: Coletta Memos, MD;  Location: [facility];  Service: Neurosurgery;  Laterality: Right;  3C/RM 21   THORACIC DISCECTOMY  07/16/2012   Procedure: THORACIC DISCECTOMY;  Surgeon: Carmela Hurt, MD;  Location: [facility];  Service: Neurosurgery;  Laterality: Right;  RIGHT Thoracic seven-eight  thoracic diskectomy via thoracotomy by dr Laneta Simmers   THORACIC DISCECTOMY N/A 12/15/2014   Procedure: THORACIC SEVEN TO THORACIC NINE Laminectomy ;  Surgeon: Coletta Memos, MD;  Location: [facility];  Service: Neurosurgery;  Laterality: N/A;   THORACIC DISCECTOMY N/A 06/06/2017   Procedure: LAMINECTOMY THORACIC NINE-TEN;  Surgeon: Coletta Memos, MD;  Location: [facility];  Service:  Neurosurgery;  Laterality: N/A;  LAMINECTOMY THORACIC NINE-TEN   THORACOTOMY  07/20/2011   Procedure: THORACOTOMY OPEN FOR SPINE SURGERY;  Surgeon: Norton Blizzard, MD;  Location: [facility];  Service: Vascular;  Laterality: N/A;   THORACOTOMY  07/16/2012   Procedure: THORACOTOMY OPEN FOR SPINE SURGERY;  Surgeon: Alleen Borne, MD;  Location: [facility];  Service: Thoracic;  Laterality: N/A;   Patient Active Problem List   Diagnosis Date Noted   Nerve pain 12/05/2022   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   S/P lumbar spinal fusion 05/25/2022   Synovial cyst of lumbar facet joint 05/25/2022   HNP (herniated nucleus pulposus), lumbar 10/06/2021   Hand pain 06/28/2021   Lumbar facet arthropathy 01/11/2021   Thoracic spondylosis with myelopathy 01/11/2021   Thoracic spinal stenosis 06/06/2017   Stenosis, spinal, thoracic 12/15/2014   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 09/22/2014   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/2011    PCP: Nira Retort  REFERRING PROVIDER: Ranelle Oyster, MD  REFERRING DIAG: thoracic disc disease with myelopathy, nerve pain, adhesive arachnoiditis  Rationale for Evaluation and Treatment: Rehabilitation  THERAPY DIAG:  Other low back pain  Other symptoms and signs involving the nervous system  Radiculopathy, lumbar region  Muscle weakness (generalized)  Repeated falls  Difficulty in walking, not elsewhere classified  ONSET DATE:  Suddenly started having weakness 11 years ago, most recent episode of worsening October 2022, s/p right L4-5 laminotomy, microdiscectomy on 10/06/2021, now  s/p L4-5 PLIF by Dr. Franky Macho on 05/25/2022.  PERTINENT HISTORY:  Patient is a 42 y.o. male who presents to outpatient physical therapy with a referral for medical diagnosis thoracic disc disease with myelopathy, nerve pain, adhesive arachnoiditis. This patient's chief complaints consist of disabling low back and R >L leg pain  and weakness with activity, bowel and bladder urgency, incontinence, and retention, leading to the following functional deficits: difficulty with or unable to complete any activity that requires weight bearing, use of B LE, and/or balance including working, household and community mobility, walking, driving, going to family gatherings, avoiding incontinent episodes, playing with daughter, helping around the house, bed mobility, transfers. 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 Franky Macho on 05/25/2022, adhesive arachnoiditis, nerve pain.  Patient denies hx of cancer, stroke, seizures, lung problems, heart problems, diabetes, unexplained weight loss, and osteoporosis.  SUBJECTIVE:                                                                                                                                                                                           SUBJECTIVE STATEMENT:     Patient arrives to treatment session with BRW. He states things have been really rough since last PT session. He started having burning at the left thenar eminence and thumb on Saturday. He states he had a cookout and he sat in a chair. All of a sudden it started hurting in his left hand. He states he woke up that day with a "crick" in his neck on the right side and made it hard to turn his head to the right (but not the left). He states the spot on his left thumb has been getting better but it still feels like his thumb into thumb is on the stove. The pain is constant. He states the pain in the thenar  eminence goes away when he takes his medication but he still has pain at the tip of his thumb. He states he messaged Dr. Riley Kill about it but has not heard back yet. He had an exacerbation of his low back and LE pain since last PT session which caused him to miss his second PT visit last week. He did have the headache associated with these symptoms as well. He is back today because he  is able to walk and is able to grip his walker today. He missed Thursday PT session because he could not put pressure on his right leg because of the pain. He states he thinks this is due to his trunk falling backwards on the mat at the end of last PT session. He states his W/C is supposed to be delivered this week.   PAIN:  NPRS: 5/10 low back, bilateral posterior thighs, right calf right shin, and right hip/groin. 2/10 left thumb and thenar eminence.   PRECAUTIONS: Fall  PATIENT GOALS: "To get better, walk, drive again, climb stairs, play with my daughter, help around the house again"  NEXT MD VISIT: 03/13/2023 follow up with Dr. Riley Kill.   OBJECTIVE   NEUROLOGICAL Dermatomes C2-T1 appears equal and intact to light touch.   CERVICAL SPINE AROM *Indicates pain Flexion: WFL Extension: increased concordant paresthesia with OP Side Flexion:   R WFL  L WFL except right neck pain Rotation:  R WNL with OP L  increased concordant thumb paresthesia with OP Protraction: WNL Retrusion: no increased concordant paresthesia with OP, increased central base of neck pain with OP.   CERVICAL SPINE SPECIAL TESTS Cervical spine axial compression: negative Spurling's part B:  R = negative, L = negative Cervical spine axial distraction: negative  UPPER LIMB NEURODYNAMIC TESTS Upper Limb Tension Test 1 (ULTT1, Median nerve bias, Magee-ULTT1):  R = negative L  = positive  Upper Limb Tension Test 2B (ULTT2B, Radial nerve bias, Magee-ULTT3):  R  = negative  L = negative  ACCESSORY MOTION Reproduction of left hand  paresthesia with CPA to approx C5 or C6 (no better with repetition)  Increased discomfort with cervical spine distraction  TODAY'S TREATMENT:       Manual therapy: to reduce pain and tissue tension, improve range of motion, neuromodulation, in order to promote improved ability to complete functional activities. HOOKLYING - caudal glide with belt through right hip joint, grade III-IV, 3x30-45 seconds.  - CPA grade III-IV to cervical spine segments (see accessory motion assessment above).  - cervical spine manual distraction (feels worse, discontinued)  Therapeutic exercise: to centralize symptoms and improve ROM, strength, muscular endurance, and activity tolerance required for successful completion of functional activities.  - screen of neck/left hand pain/paresthesia (see above) - seated left median nerve glide (slider technique), 1x10 (felt better afterwards).  - seated cervical spine retraction AROM, 1x10 - Education on HEP including handout  - ambulation with BRW and SBA approx 100 feet down ramp from clinic to vehicle.  Pt required multimodal cuing for proper technique and to facilitate improved neuromuscular control, strength, range of motion, and functional ability resulting in improved performance and form.  PATIENT EDUCATION:  Education details: Exercise purpose/form. Self management techniques.  Person educated: patient Education method: Explanation Education comprehension: verbalized understanding and needs further education  HOME EXERCISE PROGRAM: Verbally:  - seated lumbar flexion stretch with head down and one LE extended using rollator, 1x10 each side.  - curl up with legs elevated, 5x15 seconds - sidelying open book 1x10 each side  Access Code: [email or web address] URL: [email or web address] Date: 06/24/2023 Prepared by: Norton Blizzard  Exercises - Median Nerve Flossing - Tray  - 3 x daily - 10 reps - Seated Cervical Retraction  - 1-2 x daily - 3 sets - 10  reps  ASSESSMENT:  CLINICAL IMPRESSION:     Patient arrives reporting significant flair in symptoms after last PT session that is most likely related to when he lost his  balance and laid down backwards on the mat when he was seated tossing a large physioball overhead. He also arrives with report of new paresthesia and pain into the left thumb. Session focused on screening neck/left UE symptoms and appears to be related to a C6 radiculopathy that improved with median nerve glide and cervical retraction. He had significant improvement in right sided leg, glute, and hip pain with joint mobilizations to the right hip. Plan to continue with functional strengthening as appropriate. Patient would benefit from continued management of limiting condition by skilled physical therapist to address remaining impairments and functional limitations to work towards stated goals and return to PLOF or maximal functional independence.   OBJECTIVE IMPAIRMENTS: Abnormal gait, decreased activity tolerance, decreased balance, decreased coordination, decreased endurance, decreased knowledge of condition, decreased knowledge of use of DME, decreased mobility, difficulty walking, decreased ROM, decreased strength, increased edema, impaired perceived functional ability, increased muscle spasms, impaired flexibility, impaired sensation, impaired tone, improper body mechanics, postural dysfunction, obesity, and pain.   ACTIVITY LIMITATIONS: carrying, lifting, bending, standing, squatting, stairs, transfers, bed mobility, continence, bathing, toileting, dressing, hygiene/grooming, locomotion level, and caring for others  PARTICIPATION LIMITATIONS: meal prep, cleaning, laundry, interpersonal relationship, driving, shopping, community activity, occupation, yard work, and   difficulty with or unable to complete any activity that requires weight bearing, use of B LE, and/or balance including working, household and community mobility,  walking, driving, going to family gatherings, avoiding incontinent episodes, playing with daughter, helping around the house, bed mobility, transfers  PERSONAL FACTORS: Past/current experiences, Time since onset of injury/illness/exacerbation, and 3+ comorbidities:   3 thoracic spine surgeries, thoracic MRI notes "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 Franky Macho on 05/25/2022, adhesive arachnoiditis, nerve pain are also affecting patient's functional outcome.   REHAB POTENTIAL: Fair due to severity and nature of condition.   CLINICAL DECISION MAKING: Evolving/moderate complexity  EVALUATION COMPLEXITY: Moderate   GOALS: Goals reviewed with patient? No  SHORT TERM GOALS: Target date: 01/09/2023. Target date updated to 05/28/2023 for all unmet goals on 03/05/2023.   Patient will be independent with initial home exercise program for self-management of symptoms. Baseline: Initial HEP to be provided at visit 2 as appropriate (12/26/22); Goal status: MET   LONG TERM GOALS: Target date: 03/20/2023. Target date updated to 08/26/23  for all unmet goals on 06/03/2023.   Patient will be independent with a long-term home exercise program for self-management of symptoms.  Baseline: Initial HEP to be provided at visit 2 as appropriate (12/26/22); participating as able (01/31/2023); patient currently participating as tolerated (03/05/2023; 04/09/2023; 06/03/2023);   Goal status: In-progress  2.  Patient will demonstrate improved FOTO by equal or greater than 10 points by visit #13 to demonstrate improvement in overall condition and self-reported functional ability.  Baseline: to be tested  visit 2 as appropriate (12/26/22); 30 at visit #3 (01/02/2023); 46 at visit #13 (03/05/2023); 45 at visit #29 (06/03/2023); Goal status: MET  3.  Patient will demonstrate the ability to ambulate equal or greater than 600 feet with LRAD during the 6 minute walk to improve his household and community mobility.  Baseline: 34 feet with bari-RW (12/26/22); 200 feet with bari-RW (01/31/2023); 500 feet with BRW (03/05/2023); 400 feet with  BRW (04/09/2023); 152 feet with BRW and W/C follow for safety (06/03/2023);  Goal status: Ongoing  4.  Patient will complete 5 Time Sit To Stand Test from 19.5 inch surface or lower in equal or less than 15 seconds with no UE support to demonstrate improved transfer ability for improved household and toileting mobility.  Baseline: 23 seconds with heavy B UE support on RW from 19.5 inch plinth. Pain throbbing down the right LE.  (12/26/22); 19 seconds with heavy B UE support on RW from 19.5 inch plinth (03/05/2023); 20 seconds with heavy B UE support on RRW from 18.5 inch plinth. Painful in back of B calves, thighs, and lower back (04/09/2023); 15 seconds with heavy B UE support on BRW from 18.5 inch plinth. Painful in back of B calves, thighs, and lower back (06/03/2023);  Goal status: In-progress  5.  Patient will report worst pain equal or less than 3/10 with functional activities to improve his ability to complete basic household and community mobility. Baseline: up to 8/10 (12/26/22); reports 3/10 pain (01/31/2023); reports 4/10 pain (02/19/2023); up to 7/10 in the last 2 week s(03/05/2023): up to  7/10 in the last two weeks (04/09/2023); up to 8/10 in the last 2 weeks (06/03/2023);  Goal status: Ongoing   PLAN:  PT FREQUENCY: 1-2x/week  PT DURATION: 12 weeks  PLANNED INTERVENTIONS: Therapeutic exercises, Therapeutic activity, Neuromuscular re-education, Balance training, Gait training, Patient/Family education, Self Care, Joint mobilization, Stair training, Orthotic/Fit  training, DME instructions, Aquatic Therapy, Dry Needling, Electrical stimulation, Wheelchair mobility training, Spinal mobilization, Cryotherapy, Moist heat, Manual therapy, and Re-evaluation.  PLAN FOR NEXT SESSION:  update HEP as appropriate, neurodynamics, gait training, LE/core/functional strengthening, stretching, and balance as tolerated, education, manual therapy as needed.  Consider updating HEP  Cira Rue, PT, DPT 06/25/2023, 2:21 PM   [facility] [address] [place], [place] [address] P: [phone] I F: [id number]

## 2023-06-25 ENCOUNTER — Encounter: Payer: Self-pay | Admitting: Physical Therapy

## 2023-06-25 MED ORDER — DULOXETINE HCL 30 MG PO CPEP
90.0000 mg | ORAL_CAPSULE | Freq: Every day | ORAL | 11 refills | Status: DC
Start: 2023-06-25 — End: 2023-09-25

## 2023-06-25 MED ORDER — NORTRIPTYLINE HCL 50 MG PO CAPS
50.0000 mg | ORAL_CAPSULE | Freq: Every day | ORAL | 5 refills | Status: DC
Start: 2023-06-25 — End: 2024-01-08

## 2023-06-25 NOTE — Telephone Encounter (Signed)
I increased cymbalta to 90mg  daily and nortriptyline to 50mg  at bedtime

## 2023-06-26 ENCOUNTER — Ambulatory Visit: Payer: Medicare Other | Admitting: Physical Therapy

## 2023-06-26 ENCOUNTER — Encounter: Payer: Self-pay | Admitting: Physical Therapy

## 2023-06-26 DIAGNOSIS — R296 Repeated falls: Secondary | ICD-10-CM

## 2023-06-26 DIAGNOSIS — M5416 Radiculopathy, lumbar region: Secondary | ICD-10-CM

## 2023-06-26 DIAGNOSIS — R29818 Other symptoms and signs involving the nervous system: Secondary | ICD-10-CM

## 2023-06-26 DIAGNOSIS — M6281 Muscle weakness (generalized): Secondary | ICD-10-CM

## 2023-06-26 DIAGNOSIS — M5459 Other low back pain: Secondary | ICD-10-CM

## 2023-06-26 DIAGNOSIS — R262 Difficulty in walking, not elsewhere classified: Secondary | ICD-10-CM

## 2023-06-26 NOTE — Therapy (Signed)
OUTPATIENT PHYSICAL THERAPY TREATMENT  Patient Name: Travis Fitzpatrick MRN: [id number] DOB:Sep 09, 1980, 42 y.o., male Today's Date: 06/26/23   END OF SESSION:  PT End of Session - 06/26/23 1744     Visit Number 34    Number of Visits 43    Date for PT Re-Evaluation 08/26/23    Authorization Type MEDICARE PART B reporting period from 06/05/2023    Progress Note Due on Visit 40    PT Start Time 1743    PT Stop Time 1815    PT Time Calculation (min) 32 min    Activity Tolerance Patient limited by pain    Behavior During Therapy [facility] for tasks assessed/performed              Past Medical History:  Diagnosis Date   Bowel trouble    urgency   Medical history non-contributory    Urinary urgency    Past Surgical History:  Procedure Laterality Date   BACK SURGERY  2010   CIRCUMCISION     LUMBAR LAMINECTOMY/DECOMPRESSION MICRODISCECTOMY  07/20/2011   Procedure: LUMBAR LAMINECTOMY/DECOMPRESSION MICRODISCECTOMY;  Surgeon: Carmela Hurt;  Location: [facility];  Service: Neurosurgery;  Laterality: N/A;  right thoracotomy with thoracic eight-nine discectomy and fusion   LUMBAR LAMINECTOMY/DECOMPRESSION MICRODISCECTOMY Right 10/06/2021   Procedure: Right Lumbar Four-Five Microdiscectomy, Right Lumbar Five-Sacal One Foraminotomy;  Surgeon: Coletta Memos, MD;  Location: [facility];  Service: Neurosurgery;  Laterality: Right;  3C/RM 21   THORACIC DISCECTOMY  07/16/2012   Procedure: THORACIC DISCECTOMY;  Surgeon: Carmela Hurt, MD;  Location: [facility];  Service: Neurosurgery;  Laterality: Right;  RIGHT Thoracic seven-eight  thoracic diskectomy via thoracotomy by dr Laneta Simmers   THORACIC DISCECTOMY N/A 12/15/2014   Procedure: THORACIC SEVEN TO THORACIC NINE Laminectomy ;  Surgeon: Coletta Memos, MD;  Location: [facility];  Service: Neurosurgery;  Laterality: N/A;   THORACIC DISCECTOMY N/A 06/06/2017   Procedure: LAMINECTOMY THORACIC NINE-TEN;  Surgeon: Coletta Memos, MD;  Location: [facility];   Service: Neurosurgery;  Laterality: N/A;  LAMINECTOMY THORACIC NINE-TEN   THORACOTOMY  07/20/2011   Procedure: THORACOTOMY OPEN FOR SPINE SURGERY;  Surgeon: Norton Blizzard, MD;  Location: [facility];  Service: Vascular;  Laterality: N/A;   THORACOTOMY  07/16/2012   Procedure: THORACOTOMY OPEN FOR SPINE SURGERY;  Surgeon: Alleen Borne, MD;  Location: [facility];  Service: Thoracic;  Laterality: N/A;   Patient Active Problem List   Diagnosis Date Noted   Nerve pain 12/05/2022   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   S/P lumbar spinal fusion 05/25/2022   Synovial cyst of lumbar facet joint 05/25/2022   HNP (herniated nucleus pulposus), lumbar 10/06/2021   Hand pain 06/28/2021   Lumbar facet arthropathy 01/11/2021   Thoracic spondylosis with myelopathy 01/11/2021   Thoracic spinal stenosis 06/06/2017   Stenosis, spinal, thoracic 12/15/2014   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 09/22/2014   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/2011    PCP: Nira Retort  REFERRING PROVIDER: Ranelle Oyster, MD  REFERRING DIAG: thoracic disc disease with myelopathy, nerve pain, adhesive arachnoiditis  Rationale for Evaluation and Treatment: Rehabilitation  THERAPY DIAG:  Other low back pain  Other symptoms and signs involving the nervous system  Radiculopathy, lumbar region  Muscle weakness (generalized)  Repeated falls  Difficulty in walking, not elsewhere classified  ONSET DATE:  Suddenly started having weakness 11 years ago, most recent episode of worsening October 2022, s/p right L4-5 laminotomy, microdiscectomy on 10/06/2021,  now s/p L4-5 PLIF by Dr. Franky Macho on 05/25/2022.  PERTINENT HISTORY:  Patient is a 42 y.o. male who presents to outpatient physical therapy with a referral for medical diagnosis thoracic disc disease with myelopathy, nerve pain, adhesive arachnoiditis. This patient's chief complaints consist of disabling low back and R >L  leg pain and weakness with activity, bowel and bladder urgency, incontinence, and retention, leading to the following functional deficits: difficulty with or unable to complete any activity that requires weight bearing, use of B LE, and/or balance including working, household and community mobility, walking, driving, going to family gatherings, avoiding incontinent episodes, playing with daughter, helping around the house, bed mobility, transfers. 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 Franky Macho on 05/25/2022, adhesive arachnoiditis, nerve pain.  Patient denies hx of cancer, stroke, seizures, lung problems, heart problems, diabetes, unexplained weight loss, and osteoporosis.  SUBJECTIVE:                                                                                                                                                                                           SUBJECTIVE STATEMENT:     Patient arrives to treatment session with BRW. He states he is okay. He states he only has left hand symptoms about 2x per day, and when this happens he does the exercises shown in PT last week and that makes it feel better. He continued to feel better in his left thumb after last PT visit. He states his legs and back had the normal pain after last PT session. He states he has his normal low back and leg pain currently.   PAIN:  NPRS: 4/10 low back, bilateral posterior thighs, right calf right shin, and right hip/groin. 1/10 left thenar eminence.   PRECAUTIONS: Fall  PATIENT GOALS: "To get  better, walk, drive again, climb stairs, play with my daughter, help around the house again"  NEXT MD VISIT: 03/13/2023 follow up with Dr. Riley Kill.   OBJECTIVE   TODAY'S TREATMENT:       Manual therapy: to reduce pain and tissue tension, improve range of motion, neuromodulation, in order to promote improved ability to complete functional activities. HOOKLYING - caudal glide with belt through right hip joint, grade III-IV, 4-5x30-45 seconds.   Therapeutic exercise: to centralize symptoms and improve  ROM, strength, muscular endurance, and activity tolerance required for successful completion of functional activities.  - ambulated 408 feet with BRW - seated single arm pull down with half-crimp grip using tension block finger board 20mm hold, trial at various weights on each side until found max able to hold: 70# right hand for 5 seconds and 3x2 seconds on the left hand. (No pain in back or thumb).  - ambulation with BRW and SBA approx 100 feet down ramp from clinic to vehicle.  Pt required multimodal cuing for proper technique and to facilitate improved neuromuscular control, strength, range of motion, and functional ability resulting in improved performance and form.  PATIENT EDUCATION:  Education details: Exercise purpose/form. Self management techniques.  Person educated: patient Education method: Explanation Education comprehension: verbalized understanding and needs further education  HOME EXERCISE PROGRAM: Verbally:  - seated lumbar flexion stretch with head down and one LE extended using rollator, 1x10 each side.  - curl up with legs elevated, 5x15 seconds - sidelying open book 1x10 each side  Access Code: [email or web address] URL: [email or web address] Date: 06/24/2023 Prepared by: Norton Blizzard  Exercises - Median Nerve Flossing - Tray  - 3 x daily - 10 reps - Seated Cervical Retraction  - 1-2 x daily - 3 sets - 10 reps  ASSESSMENT:  CLINICAL IMPRESSION:     Patient  arrives with significant improvement in left thumb pain and good participation in HEP. He got a lot of relief from manual therapy today and was able to walk farther than usual with his BRW after. He was excited about trying a pull down exercise using a training tool for rock climbers but that provided him with strengthening for lateral trunk and with distraction force on back with excellent tolerance. He noted he used to love strength training and he would likey benefit from continued use of overhead pulling techniques to improve strength in his upper body and trunk at next session. Patient would benefit from continued management of limiting condition by skilled physical therapist to address remaining impairments and functional limitations to work towards stated goals and return to PLOF or maximal functional independence.    OBJECTIVE IMPAIRMENTS: Abnormal gait, decreased activity tolerance, decreased balance, decreased coordination, decreased endurance, decreased knowledge of condition, decreased knowledge of use of DME, decreased mobility, difficulty walking, decreased ROM, decreased strength, increased edema, impaired perceived functional ability, increased muscle spasms, impaired flexibility, impaired sensation, impaired tone, improper body mechanics, postural dysfunction, obesity, and pain.   ACTIVITY LIMITATIONS: carrying, lifting, bending, standing, squatting, stairs, transfers, bed mobility, continence, bathing, toileting, dressing, hygiene/grooming, locomotion level, and caring for others  PARTICIPATION LIMITATIONS: meal prep, cleaning, laundry, interpersonal relationship, driving, shopping, community activity, occupation, yard work, and   difficulty with or unable to complete any activity that requires weight bearing, use of B LE, and/or balance including working, household and community mobility, walking, driving, going to family gatherings, avoiding incontinent episodes, playing with daughter,  helping around the house, bed mobility, transfers  PERSONAL FACTORS: Past/current experiences, Time since onset of injury/illness/exacerbation, and 3+ comorbidities:   3 thoracic spine surgeries, thoracic MRI notes "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 Franky Macho on 05/25/2022, adhesive arachnoiditis, nerve pain are also affecting patient's functional outcome.  REHAB POTENTIAL: Fair due to severity and nature of condition.   CLINICAL DECISION MAKING: Evolving/moderate complexity  EVALUATION COMPLEXITY: Moderate   GOALS: Goals reviewed with patient? No  SHORT TERM GOALS: Target date: 01/09/2023. Target date updated to 05/28/2023 for all unmet goals on 03/05/2023.   Patient will be independent with initial home exercise program for self-management of symptoms. Baseline: Initial HEP to be provided at visit 2 as appropriate (12/26/22); Goal status: MET   LONG TERM GOALS: Target date: 03/20/2023. Target date updated to 08/26/23  for all unmet goals on 06/03/2023.   Patient will be independent with a long-term home exercise program for self-management of symptoms.  Baseline: Initial HEP to be provided at visit 2 as appropriate (12/26/22); participating as able (01/31/2023); patient currently participating as tolerated (03/05/2023; 04/09/2023; 06/03/2023);   Goal status: In-progress  2.  Patient will demonstrate improved FOTO by equal or greater than 10 points by visit #13 to demonstrate improvement in overall condition and self-reported functional ability.  Baseline: to be tested visit 2 as appropriate (12/26/22); 30 at visit #3 (01/02/2023); 46 at visit #13 (03/05/2023); 45 at visit  #29 (06/03/2023); Goal status: MET  3.  Patient will demonstrate the ability to ambulate equal or greater than 600 feet with LRAD during the 6 minute walk to improve his household and community mobility.  Baseline: 34 feet with bari-RW (12/26/22); 200 feet with bari-RW (01/31/2023); 500 feet with BRW (03/05/2023); 400 feet with BRW (04/09/2023); 152 feet with BRW and W/C follow for safety (06/03/2023);  Goal status: Ongoing  4.  Patient will complete 5 Time Sit To Stand Test from 19.5 inch surface or lower in equal or less than 15 seconds with no UE support to demonstrate improved transfer ability for improved household and toileting mobility.  Baseline: 23 seconds with heavy B UE support on RW from 19.5 inch plinth. Pain throbbing down the right LE.  (12/26/22); 19 seconds with heavy B UE support on RW from 19.5 inch plinth (03/05/2023); 20 seconds with heavy B UE support on RRW from 18.5 inch plinth. Painful in back of B calves, thighs, and lower back (04/09/2023); 15 seconds with heavy B UE support on BRW from 18.5 inch plinth. Painful in back of B calves, thighs, and lower back (06/03/2023);  Goal status: In-progress  5.  Patient will report worst pain equal or less than 3/10 with functional activities to improve his ability to complete basic household and community mobility. Baseline: up to 8/10 (12/26/22); reports 3/10 pain (01/31/2023); reports 4/10 pain (02/19/2023); up to 7/10 in the last 2 week s(03/05/2023): up to  7/10 in the last two weeks (04/09/2023); up to 8/10 in the last 2 weeks (06/03/2023);  Goal status: Ongoing   PLAN:  PT FREQUENCY: 1-2x/week  PT DURATION: 12 weeks  PLANNED INTERVENTIONS: Therapeutic exercises, Therapeutic activity, Neuromuscular re-education, Balance training, Gait training, Patient/Family education, Self Care, Joint mobilization, Stair training, Orthotic/Fit training, DME instructions, Aquatic Therapy, Dry Needling, Electrical stimulation, Wheelchair mobility  training, Spinal mobilization, Cryotherapy, Moist heat, Manual therapy, and Re-evaluation.  PLAN FOR NEXT SESSION:  update HEP as appropriate, neurodynamics, gait training, LE/core/functional strengthening, stretching, and balance as tolerated, education, manual therapy as needed.  Consider updating HEP  Cira Rue, PT, DPT 06/26/2023, 7:52 PM   [facility] [address] [place], [place] [address] P: [phone] I F: [phone]

## 2023-06-28 ENCOUNTER — Other Ambulatory Visit: Payer: Self-pay | Admitting: Registered Nurse

## 2023-07-01 ENCOUNTER — Ambulatory Visit: Payer: Medicare Other | Admitting: Physical Therapy

## 2023-07-01 DIAGNOSIS — M5416 Radiculopathy, lumbar region: Secondary | ICD-10-CM

## 2023-07-01 DIAGNOSIS — M6281 Muscle weakness (generalized): Secondary | ICD-10-CM

## 2023-07-01 DIAGNOSIS — M5459 Other low back pain: Secondary | ICD-10-CM

## 2023-07-01 DIAGNOSIS — R262 Difficulty in walking, not elsewhere classified: Secondary | ICD-10-CM

## 2023-07-01 DIAGNOSIS — R296 Repeated falls: Secondary | ICD-10-CM

## 2023-07-01 DIAGNOSIS — R29818 Other symptoms and signs involving the nervous system: Secondary | ICD-10-CM

## 2023-07-01 NOTE — Therapy (Signed)
OUTPATIENT PHYSICAL THERAPY TREATMENT  Patient Name: Travis Fitzpatrick MRN: [id number] DOB:[birth year], 42 y.o., male Today's Date: 07/02/23   END OF SESSION:  PT End of Session - 07/01/23 1821     Visit Number 35    Number of Visits 43    Date for PT Re-Evaluation 08/26/23    Authorization Type MEDICARE PART B reporting period from 06/05/2023    Progress Note Due on Visit 40    PT Start Time 1820    PT Stop Time 1858    PT Time Calculation (min) 38 min    Activity Tolerance Patient limited by pain    Behavior During Therapy [facility] for tasks assessed/performed               Past Medical History:  Diagnosis Date   Bowel trouble    urgency   Medical history non-contributory    Urinary urgency    Past Surgical History:  Procedure Laterality Date   BACK SURGERY  2010   CIRCUMCISION     LUMBAR LAMINECTOMY/DECOMPRESSION MICRODISCECTOMY  07/20/2011   Procedure: LUMBAR LAMINECTOMY/DECOMPRESSION MICRODISCECTOMY;  Surgeon: Carmela Hurt;  Location: [facility];  Service: Neurosurgery;  Laterality: N/A;  right thoracotomy with thoracic eight-nine discectomy and fusion   LUMBAR LAMINECTOMY/DECOMPRESSION MICRODISCECTOMY Right 10/06/2021   Procedure: Right Lumbar Four-Five Microdiscectomy, Right Lumbar Five-Sacal One Foraminotomy;  Surgeon: Coletta Memos, MD;  Location: [facility];  Service: Neurosurgery;  Laterality: Right;  3C/RM 21   THORACIC DISCECTOMY  07/16/2012   Procedure: THORACIC DISCECTOMY;  Surgeon: Carmela Hurt, MD;  Location: [facility];  Service: Neurosurgery;  Laterality: Right;  RIGHT Thoracic seven-eight  thoracic diskectomy via thoracotomy by dr Laneta Simmers   THORACIC DISCECTOMY N/A 12/15/2014   Procedure: THORACIC SEVEN TO THORACIC NINE Laminectomy ;  Surgeon: Coletta Memos, MD;  Location: [facility];  Service: Neurosurgery;  Laterality: N/A;   THORACIC DISCECTOMY N/A 06/06/2017   Procedure: LAMINECTOMY THORACIC NINE-TEN;  Surgeon: Coletta Memos, MD;  Location: [facility];   Service: Neurosurgery;  Laterality: N/A;  LAMINECTOMY THORACIC NINE-TEN   THORACOTOMY  07/20/2011   Procedure: THORACOTOMY OPEN FOR SPINE SURGERY;  Surgeon: Norton Blizzard, MD;  Location: [facility];  Service: Vascular;  Laterality: N/A;   THORACOTOMY  07/16/2012   Procedure: THORACOTOMY OPEN FOR SPINE SURGERY;  Surgeon: Alleen Borne, MD;  Location: [facility];  Service: Thoracic;  Laterality: N/A;   Patient Active Problem List   Diagnosis Date Noted   Nerve pain 12/05/2022   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   S/P lumbar spinal fusion 05/25/2022   Synovial cyst of lumbar facet joint 05/25/2022   HNP (herniated nucleus pulposus), lumbar 10/06/2021   Hand pain 06/28/2021   Lumbar facet arthropathy 01/11/2021   Thoracic spondylosis with myelopathy 01/11/2021   Thoracic spinal stenosis 06/06/2017   Stenosis, spinal, thoracic 12/15/2014   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 09/22/2014   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/2011    PCP: Nira Retort  REFERRING PROVIDER: Ranelle Oyster, MD  REFERRING DIAG: thoracic disc disease with myelopathy, nerve pain, adhesive arachnoiditis  Rationale for Evaluation and Treatment: Rehabilitation  THERAPY DIAG:  Other low back pain  Other symptoms and signs involving the nervous system  Radiculopathy, lumbar region  Muscle weakness (generalized)  Repeated falls  Difficulty in walking, not elsewhere classified  ONSET DATE:  Suddenly started having weakness 11 years ago, most recent episode of worsening October 2022, s/p right L4-5 laminotomy, microdiscectomy on  10/06/2021, now s/p L4-5 PLIF by Dr. Franky Macho on 05/25/2022.  PERTINENT HISTORY:  Patient is a 42 y.o. male who presents to outpatient physical therapy with a referral for medical diagnosis thoracic disc disease with myelopathy, nerve pain, adhesive arachnoiditis. This patient's chief complaints consist of disabling low back and R >L  leg pain and weakness with activity, bowel and bladder urgency, incontinence, and retention, leading to the following functional deficits: difficulty with or unable to complete any activity that requires weight bearing, use of B LE, and/or balance including working, household and community mobility, walking, driving, going to family gatherings, avoiding incontinent episodes, playing with daughter, helping around the house, bed mobility, transfers. 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 Franky Macho on 05/25/2022, adhesive arachnoiditis, nerve pain.  Patient denies hx of cancer, stroke, seizures, lung problems, heart problems, diabetes, unexplained weight loss, and osteoporosis.  SUBJECTIVE:                                                                                                                                                                                           SUBJECTIVE STATEMENT:     Patient arrives to treatment session with BRW. He states he is doing okay. He states when he left last PT session everything stiffened up and then the next day he was sore. Pain got up to 5/10 but the next day it got back down to his normal. Pain now is 2-3/10 in usual places, but he still gets pain where it wants to give out.   PAIN:  NPRS: 2-3/10 low back, bilateral posterior thighs, right calf right shin, and right hip/groin. 1/10 left thenar eminence.   PRECAUTIONS: Fall  PATIENT GOALS: "To get better, walk, drive again, climb stairs, play with my daughter, help around the house  again"  NEXT MD VISIT: 03/13/2023 follow up with Dr. Riley Kill.   OBJECTIVE   TODAY'S TREATMENT:       Manual therapy: to reduce pain and tissue tension, improve range of motion, neuromodulation, in order to promote improved ability to complete functional activities. HOOKLYING - caudal glide with belt through right hip joint, grade III-IV, 4x30-45 seconds.  (Pulling exercises - see below) - caudal glide with belt through right hip joint, grade III-IV, 3x30 seconds   Therapeutic exercise: to  centralize symptoms and improve ROM, strength, muscular endurance, and activity tolerance required for successful completion of functional activities.  - ambulated 2x50 feet with BRW and SBA  - seated overhead pull down (B shoulder flexion from overhead to 90 degrees flexion) with cable bar at 25# (tried up to 45# but unable due to discomfort and poor scapular control).  - seated single arm pull down (like lat pull down), 1x2 R (discontinued due to back pain), 1x8 L (discontinued due to pain).  - ambulation with BRW and SBA approx 100 feet down ramp from clinic to vehicle.  Pt required multimodal cuing for proper technique and to facilitate improved neuromuscular control, strength, range of motion, and functional ability resulting in improved performance and form.  PATIENT EDUCATION:  Education details: Exercise purpose/form. Self management techniques.  Person educated: patient Education method: Explanation Education comprehension: verbalized understanding and needs further education  HOME EXERCISE PROGRAM: Verbally:  - seated lumbar flexion stretch with head down and one LE extended using rollator, 1x10 each side.  - curl up with legs elevated, 5x15 seconds - sidelying open book 1x10 each side  Access Code: [email or web address] URL: [email or web address] Date: 06/24/2023 Prepared by: Norton Blizzard  Exercises - Median Nerve Flossing - Tray  - 3 x daily - 10 reps - Seated Cervical Retraction   - 1-2 x daily - 3 sets - 10 reps  ASSESSMENT:  CLINICAL IMPRESSION:     Patient arrives reporting continued difficulty with mobility due to lumbar and B leg pain and neurogenic weakness. Today's session continued with R hip mobilizations for pain relief and attempted to progress seated UE pulling exercises that were successful last session to provide further engagement to the core and lats to support her low back. Unfortunately, patient had increased low back and shooting leg pain after attempting first pulling exercise and was unable to continue with this or modified UE pulling exercises due to the pain. Patient returned to hip mobilization technique that helped calm the pain so that his pain was about the same when he left as when he arrived. Plan to continue with pain control techniques and strengthening and balance exercises as tolerated. Patient would benefit from continued management of limiting condition by skilled physical therapist to address remaining impairments and functional limitations to work towards stated goals and return to PLOF or maximal functional independence.   OBJECTIVE IMPAIRMENTS: Abnormal gait, decreased activity tolerance, decreased balance, decreased coordination, decreased endurance, decreased knowledge of condition, decreased knowledge of use of DME, decreased mobility, difficulty walking, decreased ROM, decreased strength, increased edema, impaired perceived functional ability, increased muscle spasms, impaired flexibility, impaired sensation, impaired tone, improper body mechanics, postural dysfunction, obesity, and pain.   ACTIVITY LIMITATIONS: carrying, lifting, bending, standing, squatting, stairs, transfers, bed mobility, continence, bathing, toileting, dressing, hygiene/grooming, locomotion level, and caring for others  PARTICIPATION LIMITATIONS: meal prep, cleaning, laundry, interpersonal relationship, driving, shopping, community activity, occupation, yard work, and    difficulty with or unable to complete any activity that requires weight bearing, use of B LE, and/or balance including working, household and community mobility, walking, driving, going to family gatherings, avoiding incontinent episodes, playing with daughter, helping around the house, bed mobility, transfers  PERSONAL FACTORS: Past/current experiences, Time since onset of injury/illness/exacerbation, and 3+ comorbidities:   3 thoracic spine surgeries, thoracic MRI notes "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 Franky Macho on 05/25/2022, adhesive arachnoiditis, nerve pain are also affecting patient's functional outcome.   REHAB POTENTIAL: Fair due to severity and nature of condition.   CLINICAL DECISION MAKING: Evolving/moderate complexity  EVALUATION COMPLEXITY: Moderate   GOALS: Goals reviewed with patient? No  SHORT TERM GOALS: Target date: 01/09/2023. Target date updated to 05/28/2023 for all unmet goals on 03/05/2023.   Patient will be independent with initial home exercise program for self-management of symptoms. Baseline: Initial HEP to be provided at visit 2 as appropriate (12/26/22); Goal status: MET   LONG TERM GOALS: Target date: 03/20/2023. Target date updated to 08/26/23  for all unmet goals on 06/03/2023.   Patient will be independent with a long-term home exercise program for self-management of symptoms.  Baseline: Initial HEP to be provided at visit 2 as appropriate (12/26/22); participating as able (01/31/2023); patient currently participating as tolerated (03/05/2023; 04/09/2023; 06/03/2023);   Goal status: In-progress  2.  Patient will demonstrate improved FOTO  by equal or greater than 10 points by visit #13 to demonstrate improvement in overall condition and self-reported functional ability.  Baseline: to be tested visit 2 as appropriate (12/26/22); 30 at visit #3 (01/02/2023); 46 at visit #13 (03/05/2023); 45 at visit #29 (06/03/2023); Goal status: MET  3.  Patient will demonstrate the ability to ambulate equal or greater than 600 feet with LRAD during the 6 minute walk to improve his household and community mobility.  Baseline: 34 feet with bari-RW (12/26/22); 200 feet with bari-RW (01/31/2023); 500 feet with BRW (03/05/2023); 400 feet with BRW (04/09/2023); 152 feet with BRW and W/C follow for safety (06/03/2023);  Goal status: Ongoing  4.  Patient will complete 5 Time Sit To Stand Test from 19.5 inch surface or lower in equal or less than 15 seconds with no UE support to demonstrate improved transfer ability for improved household and toileting mobility.  Baseline: 23 seconds with heavy B UE support on RW from 19.5 inch plinth. Pain throbbing down the right LE.  (12/26/22); 19 seconds with heavy B UE support on RW from 19.5 inch plinth (03/05/2023); 20 seconds with heavy B UE support on RRW from 18.5 inch plinth. Painful in back of B calves, thighs, and lower back (04/09/2023); 15 seconds with heavy B UE support on BRW from 18.5 inch plinth. Painful in back of B calves, thighs, and lower back (06/03/2023);  Goal status: In-progress  5.  Patient will report worst pain equal or less than 3/10 with functional activities to improve his ability to complete basic household and community mobility. Baseline: up to 8/10 (12/26/22); reports 3/10 pain (01/31/2023); reports 4/10 pain (02/19/2023); up to 7/10 in the last 2 week s(03/05/2023): up to  7/10 in the last two weeks (04/09/2023); up to 8/10 in the last 2 weeks (06/03/2023);  Goal status: Ongoing   PLAN:  PT FREQUENCY: 1-2x/week  PT DURATION: 12 weeks  PLANNED INTERVENTIONS: Therapeutic exercises, Therapeutic  activity, Neuromuscular re-education, Balance training, Gait training, Patient/Family education, Self Care, Joint mobilization, Stair training, Orthotic/Fit training, DME instructions, Aquatic Therapy, Dry Needling, Electrical stimulation, Wheelchair mobility training, Spinal mobilization, Cryotherapy, Moist heat, Manual therapy, and Re-evaluation.  PLAN FOR NEXT SESSION:  update HEP as appropriate, neurodynamics, gait training, LE/core/functional strengthening, stretching, and balance as tolerated, education, manual therapy as needed.  Consider updating HEP  Cira Rue, PT, DPT 07/02/2023, 5:05 PM  [facility] [address] [place], [place] [address] P: [phone] I F: [phone]

## 2023-07-02 ENCOUNTER — Encounter: Payer: Self-pay | Admitting: Physical Therapy

## 2023-07-03 ENCOUNTER — Ambulatory Visit: Payer: Medicare Other | Admitting: Physical Therapy

## 2023-07-03 ENCOUNTER — Encounter: Payer: Self-pay | Admitting: Physical Therapy

## 2023-07-03 DIAGNOSIS — M5416 Radiculopathy, lumbar region: Secondary | ICD-10-CM

## 2023-07-03 DIAGNOSIS — M6281 Muscle weakness (generalized): Secondary | ICD-10-CM

## 2023-07-03 DIAGNOSIS — R296 Repeated falls: Secondary | ICD-10-CM

## 2023-07-03 DIAGNOSIS — R262 Difficulty in walking, not elsewhere classified: Secondary | ICD-10-CM

## 2023-07-03 DIAGNOSIS — M5459 Other low back pain: Secondary | ICD-10-CM

## 2023-07-03 DIAGNOSIS — R29818 Other symptoms and signs involving the nervous system: Secondary | ICD-10-CM

## 2023-07-03 NOTE — Therapy (Signed)
OUTPATIENT PHYSICAL THERAPY TREATMENT  Patient Name: Travis Fitzpatrick MRN: [id number] DOB:1980/10/17, 42 y.o., male Today's Date: 07/03/23   END OF SESSION:  PT End of Session - 07/03/23 1736     Visit Number 36    Number of Visits 43    Date for PT Re-Evaluation 08/26/23    Authorization Type MEDICARE PART B reporting period from 06/05/2023    Progress Note Due on Visit 40    PT Start Time 1737    PT Stop Time 1815    PT Time Calculation (min) 38 min    Activity Tolerance Patient limited by pain    Behavior During Therapy [facility] for tasks assessed/performed                Past Medical History:  Diagnosis Date   Bowel trouble    urgency   Medical history non-contributory    Urinary urgency    Past Surgical History:  Procedure Laterality Date   BACK SURGERY  2010   CIRCUMCISION     LUMBAR LAMINECTOMY/DECOMPRESSION MICRODISCECTOMY  07/20/2011   Procedure: LUMBAR LAMINECTOMY/DECOMPRESSION MICRODISCECTOMY;  Surgeon: Carmela Hurt;  Location: [facility];  Service: Neurosurgery;  Laterality: N/A;  right thoracotomy with thoracic eight-nine discectomy and fusion   LUMBAR LAMINECTOMY/DECOMPRESSION MICRODISCECTOMY Right 10/06/2021   Procedure: Right Lumbar Four-Five Microdiscectomy, Right Lumbar Five-Sacal One Foraminotomy;  Surgeon: Coletta Memos, MD;  Location: [facility];  Service: Neurosurgery;  Laterality: Right;  3C/RM 21   THORACIC DISCECTOMY  07/16/2012   Procedure: THORACIC DISCECTOMY;  Surgeon: Carmela Hurt, MD;  Location: [facility];  Service: Neurosurgery;  Laterality: Right;  RIGHT Thoracic seven-eight  thoracic diskectomy via thoracotomy by dr Laneta Simmers   THORACIC DISCECTOMY N/A 12/15/2014   Procedure: THORACIC SEVEN TO THORACIC NINE Laminectomy ;  Surgeon: Coletta Memos, MD;  Location: [facility];  Service: Neurosurgery;  Laterality: N/A;   THORACIC DISCECTOMY N/A 06/06/2017   Procedure: LAMINECTOMY THORACIC NINE-TEN;  Surgeon: Coletta Memos, MD;  Location: [facility];   Service: Neurosurgery;  Laterality: N/A;  LAMINECTOMY THORACIC NINE-TEN   THORACOTOMY  07/20/2011   Procedure: THORACOTOMY OPEN FOR SPINE SURGERY;  Surgeon: Norton Blizzard, MD;  Location: [facility];  Service: Vascular;  Laterality: N/A;   THORACOTOMY  07/16/2012   Procedure: THORACOTOMY OPEN FOR SPINE SURGERY;  Surgeon: Alleen Borne, MD;  Location: [facility];  Service: Thoracic;  Laterality: N/A;   Patient Active Problem List   Diagnosis Date Noted   Nerve pain 12/05/2022   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   S/P lumbar spinal fusion 05/25/2022   Synovial cyst of lumbar facet joint 05/25/2022   HNP (herniated nucleus pulposus), lumbar 10/06/2021   Hand pain 06/28/2021   Lumbar facet arthropathy 01/11/2021   Thoracic spondylosis with myelopathy 01/11/2021   Thoracic spinal stenosis 06/06/2017   Stenosis, spinal, thoracic 12/15/2014   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 09/22/2014   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/2011    PCP: Nira Retort  REFERRING PROVIDER: Ranelle Oyster, MD  REFERRING DIAG: thoracic disc disease with myelopathy, nerve pain, adhesive arachnoiditis  Rationale for Evaluation and Treatment: Rehabilitation  THERAPY DIAG:  Other low back pain  Other symptoms and signs involving the nervous system  Radiculopathy, lumbar region  Muscle weakness (generalized)  Repeated falls  Difficulty in walking, not elsewhere classified  ONSET DATE:  Suddenly started having weakness 11 years ago, most recent episode of worsening October 2022, s/p right L4-5 laminotomy, microdiscectomy  on 10/06/2021, now s/p L4-5 PLIF by Dr. Franky Macho on 05/25/2022.  PERTINENT HISTORY:  Patient is a 42 y.o. male who presents to outpatient physical therapy with a referral for medical diagnosis thoracic disc disease with myelopathy, nerve pain, adhesive arachnoiditis. This patient's chief complaints consist of disabling low back and R >L  leg pain and weakness with activity, bowel and bladder urgency, incontinence, and retention, leading to the following functional deficits: difficulty with or unable to complete any activity that requires weight bearing, use of B LE, and/or balance including working, household and community mobility, walking, driving, going to family gatherings, avoiding incontinent episodes, playing with daughter, helping around the house, bed mobility, transfers. 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 Franky Macho on 05/25/2022, adhesive arachnoiditis, nerve pain.  Patient denies hx of cancer, stroke, seizures, lung problems, heart problems, diabetes, unexplained weight loss, and osteoporosis.  SUBJECTIVE:                                                                                                                                                                                           SUBJECTIVE STATEMENT:     Patient arrives to treatment session with BRW. Pain is elevated since last PT session, mostly in the low back where it feels like pressure and runs down to his legs. He was able to get up and move around yesterday.   PAIN:  NPRS: 4/10 mostly in low back (stabbing/pressure), bilateral posterior thighs, right calf right shin, and right hip/groin. 1/10 left thenar eminence.   PRECAUTIONS: Fall  PATIENT GOALS: "To get better, walk, drive again, climb stairs, play with my daughter, help around the house again"  NEXT MD VISIT: 03/13/2023 follow up with Dr. Riley Kill.   OBJECTIVE   TODAY'S  TREATMENT:       Manual therapy: to reduce pain and tissue tension, improve range of motion, neuromodulation, in order to promote improved ability to complete functional activities. HOOKLYING - caudal glide with belt through right hip joint, grade III-IV, 4x30-45 seconds.   Therapeutic exercise: to centralize symptoms and improve ROM, strength, muscular endurance, and activity tolerance required for successful completion of functional activities.  - ambulated 1x40, then x260 feet with BRW and SBA with chair follow (needed to sit at 40 feet due to pain  in R LE that pt could not put weight on it for a few min, recovered with seated rest.   Superset:  - seated overhead pull down (B shoulder extension from overhead to 90 degrees flexion) with cable bar, 1x10 at 15# (discontinued due to increased pain by end of set with overhead motion, cuing for abdominal activation).  - seated chest press with 7#DB held in both hands like doorknobs, 3x10  - seated overhead pull down (B shoulder extension from ~120  degrees flexion to bar on thighs) with cable bar, 2x10 at 15#  - seated hip flexion from elevated table tapping 2 BlazePods set on random, as many taps as possible in 30 seconds using both feet. 4x30 seconds (taps recorded 04/27/17 and unrecorded) Seated foot taps on BlazePods 04/27/17 and unrecorded.   - ambulation with BRW and SBA approx 100 feet down ramp from clinic to vehicle.   Pt required multimodal cuing for proper technique and to facilitate improved neuromuscular control, strength, range of motion, and functional ability resulting in improved performance and form.  PATIENT EDUCATION:  Education details: Exercise purpose/form. Self management techniques.  Person educated: patient Education method: Explanation Education comprehension: verbalized understanding and needs further education  HOME EXERCISE PROGRAM: Verbally:  - seated lumbar flexion stretch with head down and one LE extended  using rollator, 1x10 each side.  - curl up with legs elevated, 5x15 seconds - sidelying open book 1x10 each side  Access Code: [email or web address] URL: [email or web address] Date: 06/24/2023 Prepared by: Norton Blizzard  Exercises - Median Nerve Flossing - Tray  - 3 x daily - 10 reps - Seated Cervical Retraction  - 1-2 x daily - 3 sets - 10 reps  ASSESSMENT:  CLINICAL IMPRESSION:     Patient arrives reporting mild increase in pain since last PT session but did not lose ability to ambulate. Continued with pain control interventions as well as regressed version of core exercises from last session and hip flexion exercise. Patient had a moment when ambulating when his right LE suddenly started hurting so he could not put weight on it, but he recovered after a few min of sitting (chair brought to him). He had mild increased back pain with overhead pull, so angle of pull was changed to prevent arms from going overhead, which was much more comfortable. Plan to continued with these exercises as tolerated next session. Patient would benefit from continued management of limiting condition by skilled physical therapist to address remaining impairments and functional limitations to work towards stated goals and return to PLOF or maximal functional independence.    OBJECTIVE IMPAIRMENTS: Abnormal gait, decreased activity tolerance, decreased balance, decreased coordination, decreased endurance, decreased knowledge of condition, decreased knowledge of use of DME, decreased mobility, difficulty walking, decreased ROM, decreased strength, increased edema, impaired perceived functional ability, increased muscle spasms, impaired flexibility, impaired sensation, impaired tone, improper body mechanics, postural dysfunction, obesity, and pain.   ACTIVITY LIMITATIONS: carrying, lifting, bending, standing, squatting, stairs, transfers, bed mobility, continence, bathing, toileting, dressing, hygiene/grooming,  locomotion level, and caring for others  PARTICIPATION LIMITATIONS: meal prep, cleaning, laundry, interpersonal relationship, driving, shopping, community activity, occupation, yard work, and   difficulty with or unable to complete any activity that requires weight bearing, use of B LE, and/or balance including working, household and community mobility, walking, driving, going to family gatherings, avoiding incontinent episodes, playing with daughter, helping around the house, bed mobility, transfers  PERSONAL FACTORS: Past/current experiences, Time since onset of injury/illness/exacerbation, and 3+ comorbidities:  3 thoracic spine surgeries, thoracic MRI notes "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 Franky Macho on 05/25/2022, adhesive arachnoiditis, nerve pain are also affecting patient's functional outcome.   REHAB POTENTIAL: Fair due to severity and nature of condition.   CLINICAL DECISION MAKING: Evolving/moderate complexity  EVALUATION COMPLEXITY: Moderate   GOALS: Goals reviewed with patient? No  SHORT TERM GOALS: Target date: 01/09/2023. Target date updated to 05/28/2023 for all unmet goals on 03/05/2023.   Patient will be independent with initial home exercise program for self-management of symptoms. Baseline: Initial HEP to be provided at visit 2 as appropriate (12/26/22); Goal status: MET   LONG TERM GOALS: Target date: 03/20/2023. Target date updated to 08/26/23  for all unmet goals on 06/03/2023.   Patient will be independent with a long-term home exercise program for self-management of symptoms.  Baseline: Initial HEP to be provided at visit 2 as appropriate  (12/26/22); participating as able (01/31/2023); patient currently participating as tolerated (03/05/2023; 04/09/2023; 06/03/2023);   Goal status: In-progress  2.  Patient will demonstrate improved FOTO by equal or greater than 10 points by visit #13 to demonstrate improvement in overall condition and self-reported functional ability.  Baseline: to be tested visit 2 as appropriate (12/26/22); 30 at visit #3 (01/02/2023); 46 at visit #13 (03/05/2023); 45 at visit #29 (06/03/2023); Goal status: MET  3.  Patient will demonstrate the ability to ambulate equal or greater than 600 feet with LRAD during the 6 minute walk to improve his household and community mobility.  Baseline: 34 feet with bari-RW (12/26/22); 200 feet with bari-RW (01/31/2023); 500 feet with BRW (03/05/2023); 400 feet with BRW (04/09/2023); 152 feet with BRW and W/C follow for safety (06/03/2023);  Goal status: Ongoing  4.  Patient will complete 5 Time Sit To Stand Test from 19.5 inch surface or lower in equal or less than 15 seconds with no UE support to demonstrate improved transfer ability for improved household and toileting mobility.  Baseline: 23 seconds with heavy B UE support on RW from 19.5 inch plinth. Pain throbbing down the right LE.  (12/26/22); 19 seconds with heavy B UE support on RW from 19.5 inch plinth (03/05/2023); 20 seconds with heavy B UE support on RRW from 18.5 inch plinth. Painful in back of B calves, thighs, and lower back (04/09/2023); 15 seconds with heavy B UE support on BRW from 18.5 inch plinth. Painful in back of B calves, thighs, and lower back (06/03/2023);  Goal status: In-progress  5.  Patient will report worst pain equal or less than 3/10 with functional activities to improve his ability to complete basic household and community mobility. Baseline: up to 8/10 (12/26/22); reports 3/10 pain (01/31/2023); reports 4/10 pain (02/19/2023); up to 7/10 in the last 2 week s(03/05/2023): up to  7/10 in the last two weeks  (04/09/2023); up to 8/10 in the last 2 weeks (06/03/2023);  Goal status: Ongoing   PLAN:  PT FREQUENCY: 1-2x/week  PT DURATION: 12 weeks  PLANNED INTERVENTIONS: Therapeutic exercises, Therapeutic activity, Neuromuscular re-education, Balance training, Gait training, Patient/Family education, Self Care, Joint mobilization, Stair training, Orthotic/Fit training, DME instructions, Aquatic Therapy, Dry Needling, Electrical stimulation, Wheelchair mobility training, Spinal mobilization, Cryotherapy, Moist heat, Manual therapy, and Re-evaluation.  PLAN FOR NEXT SESSION:  update HEP as appropriate, neurodynamics, gait training, LE/core/functional strengthening, stretching, and balance as tolerated, education, manual therapy as needed.  Cira Rue, PT, DPT 07/03/2023, 9:19 PM   [facility] [address] [place], [place] [address] P: [phone] I F: [phone]

## 2023-07-08 ENCOUNTER — Ambulatory Visit: Payer: Medicare Other | Admitting: Physical Therapy

## 2023-07-10 ENCOUNTER — Encounter: Payer: Self-pay | Admitting: Physical Therapy

## 2023-07-10 ENCOUNTER — Ambulatory Visit: Payer: Medicare Other | Admitting: Physical Therapy

## 2023-07-10 VITALS — BP 132/78 | HR 96

## 2023-07-10 DIAGNOSIS — R296 Repeated falls: Secondary | ICD-10-CM

## 2023-07-10 DIAGNOSIS — R262 Difficulty in walking, not elsewhere classified: Secondary | ICD-10-CM

## 2023-07-10 DIAGNOSIS — R29818 Other symptoms and signs involving the nervous system: Secondary | ICD-10-CM

## 2023-07-10 DIAGNOSIS — M5416 Radiculopathy, lumbar region: Secondary | ICD-10-CM

## 2023-07-10 DIAGNOSIS — M5459 Other low back pain: Secondary | ICD-10-CM | POA: Diagnosis not present

## 2023-07-10 DIAGNOSIS — M6281 Muscle weakness (generalized): Secondary | ICD-10-CM

## 2023-07-10 NOTE — Therapy (Signed)
OUTPATIENT PHYSICAL THERAPY TREATMENT  Patient Name: Travis Fitzpatrick MRN: [id number] DOB:05-18-1981, 42 y.o., male Today's Date: 07/10/23   END OF SESSION:  PT End of Session - 07/10/23 1659     Visit Number 37    Number of Visits 43    Date for PT Re-Evaluation 08/26/23    Authorization Type MEDICARE PART B reporting period from 06/05/2023    Progress Note Due on Visit 40    PT Start Time 1658    PT Stop Time 1733    PT Time Calculation (min) 35 min    Activity Tolerance Patient limited by pain    Behavior During Therapy [facility] for tasks assessed/performed                 Past Medical History:  Diagnosis Date   Bowel trouble    urgency   Medical history non-contributory    Urinary urgency    Past Surgical History:  Procedure Laterality Date   BACK SURGERY  2010   CIRCUMCISION     LUMBAR LAMINECTOMY/DECOMPRESSION MICRODISCECTOMY  07/20/2011   Procedure: LUMBAR LAMINECTOMY/DECOMPRESSION MICRODISCECTOMY;  Surgeon: Carmela Hurt;  Location: [facility];  Service: Neurosurgery;  Laterality: N/A;  right thoracotomy with thoracic eight-nine discectomy and fusion   LUMBAR LAMINECTOMY/DECOMPRESSION MICRODISCECTOMY Right 10/06/2021   Procedure: Right Lumbar Four-Five Microdiscectomy, Right Lumbar Five-Sacal One Foraminotomy;  Surgeon: Coletta Memos, MD;  Location: [facility];  Service: Neurosurgery;  Laterality: Right;  3C/RM 21   THORACIC DISCECTOMY  07/16/2012   Procedure: THORACIC DISCECTOMY;  Surgeon: Carmela Hurt, MD;  Location: [facility];  Service: Neurosurgery;  Laterality: Right;  RIGHT Thoracic seven-eight  thoracic diskectomy via thoracotomy by dr Laneta Simmers   THORACIC DISCECTOMY N/A 12/15/2014   Procedure: THORACIC SEVEN TO THORACIC NINE Laminectomy ;  Surgeon: Coletta Memos, MD;  Location: [facility];  Service: Neurosurgery;  Laterality: N/A;   THORACIC DISCECTOMY N/A 06/06/2017   Procedure: LAMINECTOMY THORACIC NINE-TEN;  Surgeon: Coletta Memos, MD;  Location: [facility];   Service: Neurosurgery;  Laterality: N/A;  LAMINECTOMY THORACIC NINE-TEN   THORACOTOMY  07/20/2011   Procedure: THORACOTOMY OPEN FOR SPINE SURGERY;  Surgeon: Norton Blizzard, MD;  Location: [facility];  Service: Vascular;  Laterality: N/A;   THORACOTOMY  07/16/2012   Procedure: THORACOTOMY OPEN FOR SPINE SURGERY;  Surgeon: Alleen Borne, MD;  Location: [facility];  Service: Thoracic;  Laterality: N/A;   Patient Active Problem List   Diagnosis Date Noted   Nerve pain 12/05/2022   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   S/P lumbar spinal fusion 05/25/2022   Synovial cyst of lumbar facet joint 05/25/2022   HNP (herniated nucleus pulposus), lumbar 10/06/2021   Hand pain 06/28/2021   Lumbar facet arthropathy 01/11/2021   Thoracic spondylosis with myelopathy 01/11/2021   Thoracic spinal stenosis 06/06/2017   Stenosis, spinal, thoracic 12/15/2014   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 09/22/2014   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/2011    PCP: Nira Retort  REFERRING PROVIDER: Ranelle Oyster, MD  REFERRING DIAG: thoracic disc disease with myelopathy, nerve pain, adhesive arachnoiditis  Rationale for Evaluation and Treatment: Rehabilitation  THERAPY DIAG:  Other low back pain  Other symptoms and signs involving the nervous system  Radiculopathy, lumbar region  Muscle weakness (generalized)  Repeated falls  Difficulty in walking, not elsewhere classified  ONSET DATE:  Suddenly started having weakness 11 years ago, most recent episode of worsening October 2022, s/p right L4-5 laminotomy,  microdiscectomy on 10/06/2021, now s/p L4-5 PLIF by Dr. Franky Macho on 05/25/2022.  PERTINENT HISTORY:  Patient is a 42 y.o. male who presents to outpatient physical therapy with a referral for medical diagnosis thoracic disc disease with myelopathy, nerve pain, adhesive arachnoiditis. This patient's chief complaints consist of disabling low back and R >L  leg pain and weakness with activity, bowel and bladder urgency, incontinence, and retention, leading to the following functional deficits: difficulty with or unable to complete any activity that requires weight bearing, use of B LE, and/or balance including working, household and community mobility, walking, driving, going to family gatherings, avoiding incontinent episodes, playing with daughter, helping around the house, bed mobility, transfers. 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 Franky Macho on 05/25/2022, adhesive arachnoiditis, nerve pain.  Patient denies hx of cancer, stroke, seizures, lung problems, heart problems, diabetes, unexplained weight loss, and osteoporosis.  SUBJECTIVE:                                                                                                                                                                                           SUBJECTIVE STATEMENT:     Patient arrives to treatment session with BRW. He states he felt okay after last PT session until Saturday. On Saturday he started having a lot of back and leg pain and could not bear weight on the right LE. He started having nausea on 07/09/2023 when he got out of the shower. He took his blood pressure twice that day 141/99 mmHg HR 116 bpm and 139/101 mmHg and  HR 107 bpm. Today his blood pressure was 128/89 mmHg HR 102 bpm.  He states he was late since his daughter did not want to get in the car.   PAIN:  NPRS: 4/10 mostly in low back (stabbing/pressure), bilateral posterior thighs, right calf  right shin, and right hip/groin. 0/10 left thenar eminence.   PRECAUTIONS: Fall  PATIENT GOALS: "To get better, walk, drive again, climb stairs, play with my daughter, help around the house again"  NEXT MD VISIT: 03/13/2023 follow up with Dr. Riley Kill.   OBJECTIVE   Vitals:   07/10/23 1707  BP: 132/78  Pulse: 96  SpO2: 99%  Manual cuff (still too small even using large at left arm).    TODAY'S  TREATMENT:       Manual therapy: to reduce pain and tissue tension, improve range of motion, neuromodulation, in order to promote improved ability to complete functional activities. HOOKLYING - caudal glide with belt through right hip joint, grade III-IV, 4x30-45 seconds.   Therapeutic exercise: to centralize symptoms and improve ROM, strength, muscular endurance, and activity tolerance required for successful completion of functional activities.  - vitals measurement after report of significantly elevated HR and diastolic BP (WFL - see above) - ambulated 1x300 feet with BRW and SBA with chair follow. Limited by shocking pain down back of legs.   Superset:  - seated overhead pull down (B shoulder extension from ~120  degrees flexion to bar on thighs) with cable bar, 2x20 at 15# - seated OH press with 8#DBs 1x8 (discontinued due to back/leg pain) - ambulation with BRW and SBA approx 100 feet down ramp from clinic to vehicle.  Pt required multimodal cuing for proper technique and to facilitate improved neuromuscular control, strength, range of motion, and functional ability resulting in improved performance and form.  PATIENT EDUCATION:  Education details: Exercise purpose/form. Self management techniques.  Person educated: patient Education method: Explanation Education comprehension: verbalized understanding and needs further education  HOME EXERCISE PROGRAM: Verbally:  - seated lumbar flexion stretch with head down and one LE extended using rollator, 1x10 each side.  - curl up with  legs elevated, 5x15 seconds - sidelying open book 1x10 each side  Access Code: [email or web address] URL: [email or web address] Date: 06/24/2023 Prepared by: Norton Blizzard  Exercises - Median Nerve Flossing - Tray  - 3 x daily - 10 reps - Seated Cervical Retraction  - 1-2 x daily - 3 sets - 10 reps  ASSESSMENT:  CLINICAL IMPRESSION:     Patient arrives late and session shortened accordingly. Vitals measured this visit due to report of elevated BP and HR but were found to be within functional limits for today's session. Patient receive education to continue following his PCP's advice on blood pressure and re-educated on optimal conditions to measure resting vitals at home. Patient continues to get significant relief from hip distraction and reported feeling better by end of session. Patient continued to work on core strength through upper extremity pushing and pulling exercises this session but was unable to get to [employer] activity due to time constraints. He felt concordant back/LE pain with overhead press, so this was discontinued and it is recommended patient complete chest press instead of overhead press next session. Patient would benefit from continued management of limiting condition by skilled physical therapist to address remaining impairments and functional limitations to work towards stated goals and return to PLOF or maximal functional independence.     OBJECTIVE IMPAIRMENTS: Abnormal gait, decreased activity tolerance, decreased balance, decreased coordination, decreased endurance, decreased knowledge of condition, decreased knowledge of use of DME, decreased mobility, difficulty walking, decreased ROM, decreased strength, increased edema, impaired perceived functional ability, increased muscle spasms, impaired flexibility, impaired sensation, impaired tone, improper body mechanics, postural dysfunction, obesity, and pain.   ACTIVITY LIMITATIONS: carrying, lifting, bending,  standing, squatting, stairs, transfers, bed mobility, continence, bathing, toileting, dressing, hygiene/grooming, locomotion level, and caring for others  PARTICIPATION LIMITATIONS: meal prep, cleaning, laundry, interpersonal relationship, driving, shopping, community activity, occupation, yard work, and   difficulty with or unable to complete any activity that requires weight bearing, use of B LE, and/or balance including working, household and community mobility, walking, driving, going to family gatherings, avoiding incontinent episodes, playing with daughter, helping around  the house, bed mobility, transfers  PERSONAL FACTORS: Past/current experiences, Time since onset of injury/illness/exacerbation, and 3+ comorbidities:   3 thoracic spine surgeries, thoracic MRI notes "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 Franky Macho on 05/25/2022, adhesive arachnoiditis, nerve pain are also affecting patient's functional outcome.   REHAB POTENTIAL: Fair due to severity and nature of condition.   CLINICAL DECISION MAKING: Evolving/moderate complexity  EVALUATION COMPLEXITY: Moderate   GOALS: Goals reviewed with patient? No  SHORT TERM GOALS: Target date: 01/09/2023. Target date updated to 05/28/2023 for all unmet goals on 03/05/2023.   Patient will be independent with initial home exercise program for self-management of symptoms. Baseline: Initial HEP to be provided at visit 2 as appropriate (12/26/22); Goal status: MET   LONG TERM GOALS: Target date: 03/20/2023. Target date updated to 08/26/23  for all unmet goals on 06/03/2023.   Patient will be independent with a long-term home exercise  program for self-management of symptoms.  Baseline: Initial HEP to be provided at visit 2 as appropriate (12/26/22); participating as able (01/31/2023); patient currently participating as tolerated (03/05/2023; 04/09/2023; 06/03/2023);   Goal status: In-progress  2.  Patient will demonstrate improved FOTO by equal or greater than 10 points by visit #13 to demonstrate improvement in overall condition and self-reported functional ability.  Baseline: to be tested visit 2 as appropriate (12/26/22); 30 at visit #3 (01/02/2023); 46 at visit #13 (03/05/2023); 45 at visit #29 (06/03/2023); Goal status: MET  3.  Patient will demonstrate the ability to ambulate equal or greater than 600 feet with LRAD during the 6 minute walk to improve his household and community mobility.  Baseline: 34 feet with bari-RW (12/26/22); 200 feet with bari-RW (01/31/2023); 500 feet with BRW (03/05/2023); 400 feet with BRW (04/09/2023); 152 feet with BRW and W/C follow for safety (06/03/2023);  Goal status: Ongoing  4.  Patient will complete 5 Time Sit To Stand Test from 19.5 inch surface or lower in equal or less than 15 seconds with no UE support to demonstrate improved transfer ability for improved household and toileting mobility.  Baseline: 23 seconds with heavy B UE support on RW from 19.5 inch plinth. Pain throbbing down the right LE.  (12/26/22); 19 seconds with heavy B UE support on RW from 19.5 inch plinth (03/05/2023); 20 seconds with heavy B UE support on RRW from 18.5 inch plinth. Painful in back of B calves, thighs, and lower back (04/09/2023); 15 seconds with heavy B UE support on BRW from 18.5 inch plinth. Painful in back of B calves, thighs, and lower back (06/03/2023);  Goal status: In-progress  5.  Patient will report worst pain equal or less than 3/10 with functional activities to improve his ability to complete basic household and community mobility. Baseline: up to 8/10 (12/26/22); reports 3/10 pain (01/31/2023);  reports 4/10 pain (02/19/2023); up to 7/10 in the last 2 week s(03/05/2023): up to  7/10 in the last two weeks (04/09/2023); up to 8/10 in the last 2 weeks (06/03/2023);  Goal status: Ongoing   PLAN:  PT FREQUENCY: 1-2x/week  PT DURATION: 12 weeks  PLANNED INTERVENTIONS: Therapeutic exercises, Therapeutic activity, Neuromuscular re-education, Balance training, Gait training, Patient/Family education, Self Care, Joint mobilization, Stair training, Orthotic/Fit training, DME  instructions, Aquatic Therapy, Dry Needling, Electrical stimulation, Wheelchair mobility training, Spinal mobilization, Cryotherapy, Moist heat, Manual therapy, and Re-evaluation.  PLAN FOR NEXT SESSION:  update HEP as appropriate, neurodynamics, gait training, LE/core/functional strengthening, stretching, and balance as tolerated, education, manual therapy as needed.  Cira Rue, PT, DPT 07/10/2023, 7:38 PM   [facility] [address] [place], [place] [address] P: [phone] I F: [phone]

## 2023-07-17 ENCOUNTER — Ambulatory Visit: Payer: Medicare Other | Admitting: Physical Therapy

## 2023-07-23 ENCOUNTER — Encounter: Payer: Self-pay | Admitting: Physical Therapy

## 2023-07-23 ENCOUNTER — Ambulatory Visit: Payer: Medicare Other | Attending: Physical Medicine & Rehabilitation | Admitting: Physical Therapy

## 2023-07-23 DIAGNOSIS — M5416 Radiculopathy, lumbar region: Secondary | ICD-10-CM | POA: Diagnosis present

## 2023-07-23 DIAGNOSIS — R296 Repeated falls: Secondary | ICD-10-CM | POA: Diagnosis present

## 2023-07-23 DIAGNOSIS — R29818 Other symptoms and signs involving the nervous system: Secondary | ICD-10-CM | POA: Diagnosis present

## 2023-07-23 DIAGNOSIS — M6281 Muscle weakness (generalized): Secondary | ICD-10-CM

## 2023-07-23 DIAGNOSIS — R262 Difficulty in walking, not elsewhere classified: Secondary | ICD-10-CM | POA: Diagnosis present

## 2023-07-23 DIAGNOSIS — M5459 Other low back pain: Secondary | ICD-10-CM | POA: Diagnosis present

## 2023-07-23 NOTE — Therapy (Signed)
OUTPATIENT PHYSICAL THERAPY TREATMENT  Patient Name: Travis Fitzpatrick MRN: [id number] DOB:Mar 21, 1981, 42 y.o., male Today's Date: 07/23/23   END OF SESSION:  PT End of Session - 07/23/23 1936     Visit Number 38    Number of Visits 43    Date for PT Re-Evaluation 08/26/23    Authorization Type MEDICARE PART B reporting period from 06/05/2023    Progress Note Due on Visit 40    PT Start Time 1710    PT Stop Time 1745    PT Time Calculation (min) 35 min    Activity Tolerance Patient limited by pain    Behavior During Therapy [facility] for tasks assessed/performed                  Past Medical History:  Diagnosis Date   Bowel trouble    urgency   Medical history non-contributory    Urinary urgency    Past Surgical History:  Procedure Laterality Date   BACK SURGERY  2010   CIRCUMCISION     LUMBAR LAMINECTOMY/DECOMPRESSION MICRODISCECTOMY  07/20/2011   Procedure: LUMBAR LAMINECTOMY/DECOMPRESSION MICRODISCECTOMY;  Surgeon: Carmela Hurt;  Location: [facility];  Service: Neurosurgery;  Laterality: N/A;  right thoracotomy with thoracic eight-nine discectomy and fusion   LUMBAR LAMINECTOMY/DECOMPRESSION MICRODISCECTOMY Right 10/06/2021   Procedure: Right Lumbar Four-Five Microdiscectomy, Right Lumbar Five-Sacal One Foraminotomy;  Surgeon: Coletta Memos, MD;  Location: [facility];  Service: Neurosurgery;  Laterality: Right;  3C/RM 21   THORACIC DISCECTOMY  07/16/2012   Procedure: THORACIC DISCECTOMY;  Surgeon: Carmela Hurt, MD;  Location: [facility];  Service: Neurosurgery;  Laterality: Right;  RIGHT Thoracic seven-eight  thoracic diskectomy via thoracotomy by dr Laneta Simmers   THORACIC DISCECTOMY N/A 12/15/2014   Procedure: THORACIC SEVEN TO THORACIC NINE Laminectomy ;  Surgeon: Coletta Memos, MD;  Location: [facility];  Service: Neurosurgery;  Laterality: N/A;   THORACIC DISCECTOMY N/A 06/06/2017   Procedure: LAMINECTOMY THORACIC NINE-TEN;  Surgeon: Coletta Memos, MD;  Location: [facility];   Service: Neurosurgery;  Laterality: N/A;  LAMINECTOMY THORACIC NINE-TEN   THORACOTOMY  07/20/2011   Procedure: THORACOTOMY OPEN FOR SPINE SURGERY;  Surgeon: Norton Blizzard, MD;  Location: [facility];  Service: Vascular;  Laterality: N/A;   THORACOTOMY  07/16/2012   Procedure: THORACOTOMY OPEN FOR SPINE SURGERY;  Surgeon: Alleen Borne, MD;  Location: [facility];  Service: Thoracic;  Laterality: N/A;   Patient Active Problem List   Diagnosis Date Noted   Nerve pain 12/05/2022   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   S/P lumbar spinal fusion 05/25/2022   Synovial cyst of lumbar facet joint 05/25/2022   HNP (herniated nucleus pulposus), lumbar 10/06/2021   Hand pain 06/28/2021   Lumbar facet arthropathy 01/11/2021   Thoracic spondylosis with myelopathy 01/11/2021   Thoracic spinal stenosis 06/06/2017   Stenosis, spinal, thoracic 12/15/2014   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 09/22/2014   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/2011    PCP: Nira Retort  REFERRING PROVIDER: Ranelle Oyster, MD  REFERRING DIAG: thoracic disc disease with myelopathy, nerve pain, adhesive arachnoiditis  Rationale for Evaluation and Treatment: Rehabilitation  THERAPY DIAG:  Other low back pain  Other symptoms and signs involving the nervous system  Radiculopathy, lumbar region  Muscle weakness (generalized)  Repeated falls  Difficulty in walking, not elsewhere classified  ONSET DATE:  Suddenly started having weakness 11 years ago, most recent episode of worsening October 2022, s/p right L4-5  laminotomy, microdiscectomy on 10/06/2021, now s/p L4-5 PLIF by Dr. Franky Macho on 05/25/2022.  PERTINENT HISTORY:  Patient is a 42 y.o. male who presents to outpatient physical therapy with a referral for medical diagnosis thoracic disc disease with myelopathy, nerve pain, adhesive arachnoiditis. This patient's chief complaints consist of disabling low back and R >L  leg pain and weakness with activity, bowel and bladder urgency, incontinence, and retention, leading to the following functional deficits: difficulty with or unable to complete any activity that requires weight bearing, use of B LE, and/or balance including working, household and community mobility, walking, driving, going to family gatherings, avoiding incontinent episodes, playing with daughter, helping around the house, bed mobility, transfers. 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 Franky Macho on 05/25/2022, adhesive arachnoiditis, nerve pain.  Patient denies hx of cancer, stroke, seizures, lung problems, heart problems, diabetes, unexplained weight loss, and osteoporosis.  SUBJECTIVE:                                                                                                                                                                                           SUBJECTIVE STATEMENT:     Patient arrives to treatment session with BRW. He states he fell since last PT session. He fell down the stairs at his MIL's house when he went to visit. This was last Monday and why he could not come to PT last week. He ha some scrapes on the back of his legs, but no lasting injuries he is concerned about.   PAIN:  NPRS: 3/10 mostly in low back (stabbing/pressure), bilateral posterior thighs, right calf right shin, and right hip/groin. 0/10 left thenar eminence.   PRECAUTIONS: Fall  PATIENT GOALS: "To get better, walk, drive again, climb stairs, play with my daughter, help around the house  again"  NEXT MD VISIT: 03/13/2023 follow up with Dr. Riley Kill.   OBJECTIVE   TODAY'S TREATMENT:       Manual therapy: to reduce pain and tissue tension, improve range of motion, neuromodulation, in order to promote improved ability to complete functional activities. HOOKLYING - caudal glide with belt through right hip joint, grade III-IV, 4x30-45 seconds.   Therapeutic exercise: to centralize symptoms and improve ROM, strength, muscular endurance, and activity tolerance required for successful completion of functional activities.  -  ambulated 1x200 feet with BRW and SBA with chair follow. Limited by increased shocking pain down back of legs.   Superset:  - seated overhead pull down (B shoulder extension from ~120  degrees flexion to bar on thighs) with cable bar, 3x20 at 20# - seated CHEST press with 8#DBs 3x18/15/15. Posture as upright as possible without provoking pain (limited by pain).   - seated unilateral biceps curl, 1x10 left with 15#,  1x15 right with 10#. Limited by increased back pain that did not improve with rest.  - sit <> stand revealed increased pain   - ambulation with BRW and SBA approx 100 feet down ramp from clinic to vehicle.  Pt required multimodal cuing for proper technique and to facilitate improved neuromuscular control, strength, range of motion, and functional ability resulting in improved performance and form.    PATIENT EDUCATION:  Education details: Exercise purpose/form. Self management techniques.  Person educated: patient Education method: Explanation Education comprehension: verbalized understanding and needs further education  HOME EXERCISE PROGRAM: Verbally:  - seated lumbar flexion stretch with head down and one LE extended using rollator, 1x10 each side.  - curl up with legs elevated, 5x15 seconds - sidelying open book 1x10 each side  Access Code: [email or web address] URL: [email or web address] Date: 06/24/2023 Prepared by: Norton Blizzard  Exercises - Median Nerve Flossing - Tray  - 3 x daily - 10 reps - Seated Cervical Retraction  - 1-2 x daily - 3 sets - 10 reps  ASSESSMENT:  CLINICAL IMPRESSION:     Patient arrives after recovering from a fall down some steps at the Thanksgiving holiday. He appeared to be functioning at his usual level with no significant change in function related to the fall. Today's session continued to focus on pain control at the right hip with continued good response to manual distraction at the right hip. He continued to be limited in standing tolerance and trunk loading by back pain radiating into his R>L LE. He was able to tolerate seated abdominal brace with forward pull down and seated chest press, but had lasting increase in symptoms with attempt at seated single arm bicep curls to laterally load the trunk. His elevated pain was then relieved by lumbar flexion roll out with ball and was then able to ambulate back to his vehicle with BRW and SBA but did not need a w/c follow. Plan to continue with pain relief strategies and strengthening as tolerated next session to maximize his functional independence. Suggest [employer] marching activity if able to tolerate. Patient would benefit from continued management of limiting condition by skilled physical therapist to address remaining impairments and functional limitations to work towards stated goals and return to PLOF or maximal functional independence.   OBJECTIVE IMPAIRMENTS: Abnormal gait, decreased activity tolerance, decreased balance, decreased coordination, decreased endurance, decreased knowledge of condition, decreased knowledge of use of DME, decreased mobility, difficulty walking, decreased ROM, decreased strength, increased edema, impaired perceived functional ability, increased muscle spasms, impaired flexibility, impaired sensation, impaired tone, improper body mechanics, postural dysfunction, obesity, and pain.   ACTIVITY LIMITATIONS:  carrying, lifting, bending, standing, squatting, stairs, transfers, bed mobility, continence, bathing, toileting, dressing, hygiene/grooming, locomotion level, and caring for others  PARTICIPATION LIMITATIONS: meal prep, cleaning, laundry, interpersonal relationship, driving, shopping, community activity, occupation, yard work, and   difficulty with or unable to complete any activity that requires weight bearing, use of B LE, and/or balance including working, household and community mobility, walking, driving, going to family gatherings, avoiding incontinent episodes, playing  with daughter, helping around the house, bed mobility, transfers  PERSONAL FACTORS: Past/current experiences, Time since onset of injury/illness/exacerbation, and 3+ comorbidities:   3 thoracic spine surgeries, thoracic MRI notes "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 Franky Macho on 05/25/2022, adhesive arachnoiditis, nerve pain are also affecting patient's functional outcome.   REHAB POTENTIAL: Fair due to severity and nature of condition.   CLINICAL DECISION MAKING: Evolving/moderate complexity  EVALUATION COMPLEXITY: Moderate   GOALS: Goals reviewed with patient? No  SHORT TERM GOALS: Target date: 01/09/2023. Target date updated to 05/28/2023 for all unmet goals on 03/05/2023.   Patient will be independent with initial home exercise program for self-management of symptoms. Baseline: Initial HEP to be provided at visit 2 as appropriate (12/26/22); Goal status: MET   LONG TERM GOALS: Target date: 03/20/2023. Target date updated to 08/26/23  for all unmet goals on 06/03/2023.   Patient will be independent  with a long-term home exercise program for self-management of symptoms.  Baseline: Initial HEP to be provided at visit 2 as appropriate (12/26/22); participating as able (01/31/2023); patient currently participating as tolerated (03/05/2023; 04/09/2023; 06/03/2023);   Goal status: In-progress  2.  Patient will demonstrate improved FOTO by equal or greater than 10 points by visit #13 to demonstrate improvement in overall condition and self-reported functional ability.  Baseline: to be tested visit 2 as appropriate (12/26/22); 30 at visit #3 (01/02/2023); 46 at visit #13 (03/05/2023); 45 at visit #29 (06/03/2023); Goal status: MET  3.  Patient will demonstrate the ability to ambulate equal or greater than 600 feet with LRAD during the 6 minute walk to improve his household and community mobility.  Baseline: 34 feet with bari-RW (12/26/22); 200 feet with bari-RW (01/31/2023); 500 feet with BRW (03/05/2023); 400 feet with BRW (04/09/2023); 152 feet with BRW and W/C follow for safety (06/03/2023);  Goal status: Ongoing  4.  Patient will complete 5 Time Sit To Stand Test from 19.5 inch surface or lower in equal or less than 15 seconds with no UE support to demonstrate improved transfer ability for improved household and toileting mobility.  Baseline: 23 seconds with heavy B UE support on RW from 19.5 inch plinth. Pain throbbing down the right LE.  (12/26/22); 19 seconds with heavy B UE support on RW from 19.5 inch plinth (03/05/2023); 20 seconds with heavy B UE support on RRW from 18.5 inch plinth. Painful in back of B calves, thighs, and lower back (04/09/2023); 15 seconds with heavy B UE support on BRW from 18.5 inch plinth. Painful in back of B calves, thighs, and lower back (06/03/2023);  Goal status: In-progress  5.  Patient will report worst pain equal or less than 3/10 with functional activities to improve his ability to complete basic household and community mobility. Baseline: up to 8/10 (12/26/22);  reports 3/10 pain (01/31/2023); reports 4/10 pain (02/19/2023); up to 7/10 in the last 2 week s(03/05/2023): up to  7/10 in the last two weeks (04/09/2023); up to 8/10 in the last 2 weeks (06/03/2023);  Goal status: Ongoing   PLAN:  PT FREQUENCY: 1-2x/week  PT DURATION: 12 weeks  PLANNED INTERVENTIONS: Therapeutic exercises, Therapeutic activity, Neuromuscular re-education, Balance training, Gait training, Patient/Family education, Self Care, Joint mobilization, Stair  training, Orthotic/Fit training, DME instructions, Aquatic Therapy, Dry Needling, Electrical stimulation, Wheelchair mobility training, Spinal mobilization, Cryotherapy, Moist heat, Manual therapy, and Re-evaluation.  PLAN FOR NEXT SESSION:  update HEP as appropriate, neurodynamics, gait training, LE/core/functional strengthening, stretching, and balance as tolerated, education, manual therapy as needed.  Cira Rue, PT, DPT 07/23/2023, 7:50 PM   [facility] [address] [place], [place] [address] P: [phone] I F: [phone]

## 2023-07-24 ENCOUNTER — Encounter: Payer: Medicare Other | Admitting: Physical Medicine & Rehabilitation

## 2023-07-25 ENCOUNTER — Encounter: Payer: Self-pay | Admitting: Physical Therapy

## 2023-07-25 ENCOUNTER — Ambulatory Visit: Payer: Medicare Other | Admitting: Physical Therapy

## 2023-07-25 DIAGNOSIS — R296 Repeated falls: Secondary | ICD-10-CM

## 2023-07-25 DIAGNOSIS — R262 Difficulty in walking, not elsewhere classified: Secondary | ICD-10-CM

## 2023-07-25 DIAGNOSIS — R29818 Other symptoms and signs involving the nervous system: Secondary | ICD-10-CM

## 2023-07-25 DIAGNOSIS — M5459 Other low back pain: Secondary | ICD-10-CM | POA: Diagnosis not present

## 2023-07-25 DIAGNOSIS — M6281 Muscle weakness (generalized): Secondary | ICD-10-CM

## 2023-07-25 DIAGNOSIS — M5416 Radiculopathy, lumbar region: Secondary | ICD-10-CM

## 2023-07-25 NOTE — Therapy (Signed)
OUTPATIENT PHYSICAL THERAPY TREATMENT  Patient Name: Travis Fitzpatrick MRN: [id number] DOB:November 30, 1980, 42 y.o., male Today's Date: 07/25/23   END OF SESSION:  PT End of Session - 07/25/23 1654     Visit Number 39    Number of Visits 43    Date for PT Re-Evaluation 08/26/23    Authorization Type MEDICARE PART B reporting period from 06/05/2023    Progress Note Due on Visit 40    PT Start Time 1654    PT Stop Time 1740    PT Time Calculation (min) 46 min    Activity Tolerance Patient limited by pain    Behavior During Therapy [facility] for tasks assessed/performed                   Past Medical History:  Diagnosis Date   Bowel trouble    urgency   Medical history non-contributory    Urinary urgency    Past Surgical History:  Procedure Laterality Date   BACK SURGERY  2010   CIRCUMCISION     LUMBAR LAMINECTOMY/DECOMPRESSION MICRODISCECTOMY  07/20/2011   Procedure: LUMBAR LAMINECTOMY/DECOMPRESSION MICRODISCECTOMY;  Surgeon: Carmela Hurt;  Location: [facility];  Service: Neurosurgery;  Laterality: N/A;  right thoracotomy with thoracic eight-nine discectomy and fusion   LUMBAR LAMINECTOMY/DECOMPRESSION MICRODISCECTOMY Right 10/06/2021   Procedure: Right Lumbar Four-Five Microdiscectomy, Right Lumbar Five-Sacal One Foraminotomy;  Surgeon: Coletta Memos, MD;  Location: [facility] OR;  Service: Neurosurgery;  Laterality: Right;  3C/RM 21   THORACIC DISCECTOMY  07/16/2012   Procedure: THORACIC DISCECTOMY;  Surgeon: Carmela Hurt, MD;  Location: [facility];  Service: Neurosurgery;  Laterality: Right;  RIGHT Thoracic seven-eight  thoracic diskectomy via thoracotomy by dr Laneta Simmers   THORACIC DISCECTOMY N/A 12/15/2014   Procedure: THORACIC SEVEN TO THORACIC NINE Laminectomy ;  Surgeon: Coletta Memos, MD;  Location: [facility];  Service: Neurosurgery;  Laterality: N/A;   THORACIC DISCECTOMY N/A 06/06/2017   Procedure: LAMINECTOMY THORACIC NINE-TEN;  Surgeon: Coletta Memos, MD;  Location: [facility]  OR;  Service: Neurosurgery;  Laterality: N/A;  LAMINECTOMY THORACIC NINE-TEN   THORACOTOMY  07/20/2011   Procedure: THORACOTOMY OPEN FOR SPINE SURGERY;  Surgeon: Norton Blizzard, MD;  Location: [facility];  Service: Vascular;  Laterality: N/A;   THORACOTOMY  07/16/2012   Procedure: THORACOTOMY OPEN FOR SPINE SURGERY;  Surgeon: Alleen Borne, MD;  Location: [facility];  Service: Thoracic;  Laterality: N/A;   Patient Active Problem List   Diagnosis Date Noted   Nerve pain 12/05/2022   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   S/P lumbar spinal fusion 05/25/2022   Synovial cyst of lumbar facet joint 05/25/2022   HNP (herniated nucleus pulposus), lumbar 10/06/2021   Hand pain 06/28/2021   Lumbar facet arthropathy 01/11/2021   Thoracic spondylosis with myelopathy 01/11/2021   Thoracic spinal stenosis 06/06/2017   Stenosis, spinal, thoracic 12/15/2014   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 09/22/2014   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/2011    PCP: Nira Retort  REFERRING PROVIDER: Ranelle Oyster, MD  REFERRING DIAG: thoracic disc disease with myelopathy, nerve pain, adhesive arachnoiditis  Rationale for Evaluation and Treatment: Rehabilitation  THERAPY DIAG:  Other low back pain  Other symptoms and signs involving the nervous system  Radiculopathy, lumbar region  Muscle weakness (generalized)  Repeated falls  Difficulty in walking, not elsewhere classified  ONSET DATE:  Suddenly started having weakness 11 years ago, most recent episode of worsening October 2022, s/p right  L4-5 laminotomy, microdiscectomy on 10/06/2021, now s/p L4-5 PLIF by Dr. Franky Macho on 05/25/2022.  PERTINENT HISTORY:  Patient is a 42 y.o. male who presents to outpatient physical therapy with a referral for medical diagnosis thoracic disc disease with myelopathy, nerve pain, adhesive arachnoiditis. This patient's chief complaints consist of disabling low back and  R >L leg pain and weakness with activity, bowel and bladder urgency, incontinence, and retention, leading to the following functional deficits: difficulty with or unable to complete any activity that requires weight bearing, use of B LE, and/or balance including working, household and community mobility, walking, driving, going to family gatherings, avoiding incontinent episodes, playing with daughter, helping around the house, bed mobility, transfers. 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 Franky Macho on 05/25/2022, adhesive arachnoiditis, nerve pain.  Patient denies hx of cancer, stroke, seizures, lung problems, heart problems, diabetes, unexplained weight loss, and osteoporosis.  SUBJECTIVE:                                                                                                                                                                                           SUBJECTIVE STATEMENT:     Patient arrives to treatment session with BRW. He states he had new pain stinging in the right posterior hip when he weight bears since last PT session.   PAIN:  NPRS: 4/10 mostly in low back (stabbing/pressure), bilateral posterior thighs, right calf right shin, and right hip/groin. Also new pain stinging in the right posterior hip when he weight bears since last PT session.   PRECAUTIONS: Fall  PATIENT GOALS: "To get better, walk, drive again, climb stairs, play with my daughter, help around the house again"  NEXT MD VISIT: 03/13/2023 follow up with Dr. Riley Kill.   OBJECTIVE   TODAY'S  TREATMENT:       Manual therapy: to reduce pain and tissue tension, improve range of motion, neuromodulation, in order to promote improved ability to complete functional activities. HOOKLYING - caudal glide with belt through right hip joint, grade III-IV, 4x30-45 seconds.   Therapeutic exercise: to centralize symptoms and improve ROM, strength, muscular endurance, and activity tolerance required for successful completion of functional activities.  - ambulated 1x100 feet with BRW and SBA with chair follow. Limited by increased shocking pain to right hip.  Superset:  - seated overhead pull down (B shoulder extension from ~120  degrees flexion to bar on thighs) with cable bar, unable at 20# due to right low back pain, 2x20 at 15#. Un-tucked right leg (too much knee flexion) back felt better. 2x10 at 20#.   - seated CHEST press with 8#DBs 3x15/15/4 (stopped due to pain), un-tucked leg and then able to do 1x20.Marland Kitchen Posture as upright as possible without provoking pain (limited by pain).   - seated hip flexion from elevated table (25 inches) tapping 2 BlazePods set on random, as many taps as possible in 30 seconds using both feet. 1x30 seconds (taps recorded 13)  - seated hip flexion from elevated table (25 inches) tapping 6 BlazePods set on random (with distractions on 2nd two sets), as many taps as possible in 60 seconds using both feet. 4x60 seconds (taps recorded 34/30/30/36)   - ambulation with BRW and SBA approx 100 feet down ramp from clinic to vehicle.  Pt required multimodal cuing for proper technique and to facilitate improved neuromuscular control, strength, range of motion, and functional ability resulting in improved performance and form.   PATIENT EDUCATION:  Education details: Exercise purpose/form. Self management techniques.  Person educated: patient Education method: Explanation Education comprehension: verbalized understanding and needs further education  HOME EXERCISE  PROGRAM: Verbally:  - seated lumbar flexion stretch with head down and one LE extended using rollator, 1x10 each side.  - curl up with legs elevated, 5x15 seconds - sidelying open book 1x10 each side  Access Code: [email or web address] URL: [email or web address] Date: 06/24/2023 Prepared by: Norton Blizzard  Exercises - Median Nerve Flossing - Tray  - 3 x daily - 10 reps - Seated Cervical Retraction  - 1-2 x daily - 3 sets - 10 reps  ASSESSMENT:  CLINICAL IMPRESSION:     Patient arrives with some increased burning pain in his right lumbar to hip region with weight bearing. He was able to continue exercises for core strengthening with adjustments and altered weight and reps at times due to the pain described. He also had more difficulty with walking today due to increased pain with R LE weight bearing. He demonstrated increased focus and speed with gamification using BlazePods that improved engagement and enjoyment. Patient was able to tolerate interventions throughout entire session. Patient would benefit from continued management of limiting condition by skilled physical therapist to address remaining impairments and functional limitations to work towards stated goals and return to PLOF or maximal functional independence.   OBJECTIVE IMPAIRMENTS: Abnormal gait, decreased activity tolerance, decreased balance, decreased coordination, decreased endurance, decreased knowledge of condition, decreased knowledge of use of DME, decreased mobility, difficulty walking, decreased ROM, decreased strength, increased edema, impaired perceived functional ability, increased muscle spasms, impaired flexibility, impaired sensation, impaired tone, improper body mechanics, postural dysfunction, obesity, and pain.   ACTIVITY LIMITATIONS: carrying, lifting, bending, standing, squatting, stairs, transfers, bed mobility, continence, bathing, toileting, dressing, hygiene/grooming, locomotion level, and caring for  others  PARTICIPATION LIMITATIONS: meal prep, cleaning, laundry, interpersonal relationship, driving, shopping, community activity, occupation, yard work, and   difficulty with or unable to complete any activity that requires weight bearing, use of B LE, and/or balance including working, household and community mobility, walking, driving, going to family gatherings, avoiding incontinent episodes, playing with daughter, helping around the house, bed mobility, transfers  PERSONAL FACTORS: Past/current experiences, Time since onset of injury/illness/exacerbation, and 3+ comorbidities:   3 thoracic spine surgeries, thoracic MRI notes "Myelomalacia with severe cord atrophy from T7 through  T9-10 and mild decreased volume the remainder of the thoracic cord, stable" s/p right L4-5 laminotomy, microdiscectomy on 10/06/2021, 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 Franky Macho on 05/25/2022, adhesive arachnoiditis, nerve pain are also affecting patient's functional outcome.   REHAB POTENTIAL: Fair due to severity and nature of condition.   CLINICAL DECISION MAKING: Evolving/moderate complexity  EVALUATION COMPLEXITY: Moderate   GOALS: Goals reviewed with patient? No  SHORT TERM GOALS: Target date: 01/09/2023. Target date updated to 05/28/2023 for all unmet goals on 03/05/2023.   Patient will be independent with initial home exercise program for self-management of symptoms. Baseline: Initial HEP to be provided at visit 2 as appropriate (12/26/22); Goal status: MET   LONG TERM GOALS: Target date: 03/20/2023. Target date updated to 08/26/23  for all unmet goals on 06/03/2023.   Patient will be independent with a long-term home exercise program for self-management of symptoms.  Baseline: Initial HEP to be provided at visit 2 as appropriate (12/26/22); participating as able  (01/31/2023); patient currently participating as tolerated (03/05/2023; 04/09/2023; 06/03/2023);   Goal status: In-progress  2.  Patient will demonstrate improved FOTO by equal or greater than 10 points by visit #13 to demonstrate improvement in overall condition and self-reported functional ability.  Baseline: to be tested visit 2 as appropriate (12/26/22); 30 at visit #3 (01/02/2023); 46 at visit #13 (03/05/2023); 45 at visit #29 (06/03/2023); Goal status: MET  3.  Patient will demonstrate the ability to ambulate equal or greater than 600 feet with LRAD during the 6 minute walk to improve his household and community mobility.  Baseline: 34 feet with bari-RW (12/26/22); 200 feet with bari-RW (01/31/2023); 500 feet with BRW (03/05/2023); 400 feet with BRW (04/09/2023); 152 feet with BRW and W/C follow for safety (06/03/2023);  Goal status: Ongoing  4.  Patient will complete 5 Time Sit To Stand Test from 19.5 inch surface or lower in equal or less than 15 seconds with no UE support to demonstrate improved transfer ability for improved household and toileting mobility.  Baseline: 23 seconds with heavy B UE support on RW from 19.5 inch plinth. Pain throbbing down the right LE.  (12/26/22); 19 seconds with heavy B UE support on RW from 19.5 inch plinth (03/05/2023); 20 seconds with heavy B UE support on RRW from 18.5 inch plinth. Painful in back of B calves, thighs, and lower back (04/09/2023); 15 seconds with heavy B UE support on BRW from 18.5 inch plinth. Painful in back of B calves, thighs, and lower back (06/03/2023);  Goal status: In-progress  5.  Patient will report worst pain equal or less than 3/10 with functional activities to improve his ability to complete basic household and community mobility. Baseline: up to 8/10 (12/26/22); reports 3/10 pain (01/31/2023); reports 4/10 pain (02/19/2023); up to 7/10 in the last 2 week s(03/05/2023): up to  7/10 in the last two weeks (04/09/2023); up to 8/10 in the last 2  weeks (06/03/2023);  Goal status: Ongoing   PLAN:  PT FREQUENCY: 1-2x/week  PT DURATION: 12 weeks  PLANNED INTERVENTIONS: Therapeutic exercises, Therapeutic activity, Neuromuscular re-education, Balance training, Gait training, Patient/Family education, Self Care, Joint mobilization, Stair training, Orthotic/Fit training, DME instructions, Aquatic Therapy, Dry Needling, Electrical stimulation, Wheelchair mobility training, Spinal mobilization, Cryotherapy, Moist heat, Manual therapy, and Re-evaluation.  PLAN FOR NEXT SESSION:  update HEP as appropriate, neurodynamics, gait training, LE/core/functional strengthening, stretching,  and balance as tolerated, education, manual therapy as needed.  Cira Rue, PT, DPT 07/25/2023, 6:09 PM   [facility] [address] [place], [place] [address] P: [phone] I F: [phone]

## 2023-07-26 ENCOUNTER — Other Ambulatory Visit: Payer: Self-pay | Admitting: Physical Medicine & Rehabilitation

## 2023-07-30 ENCOUNTER — Ambulatory Visit: Payer: Medicare Other | Admitting: Physical Therapy

## 2023-08-01 ENCOUNTER — Ambulatory Visit: Payer: Medicare Other | Admitting: Physical Therapy

## 2023-08-05 ENCOUNTER — Ambulatory Visit: Payer: Medicare Other

## 2023-08-05 DIAGNOSIS — M5416 Radiculopathy, lumbar region: Secondary | ICD-10-CM

## 2023-08-05 DIAGNOSIS — R29818 Other symptoms and signs involving the nervous system: Secondary | ICD-10-CM

## 2023-08-05 DIAGNOSIS — M5459 Other low back pain: Secondary | ICD-10-CM

## 2023-08-05 DIAGNOSIS — M6281 Muscle weakness (generalized): Secondary | ICD-10-CM

## 2023-08-05 NOTE — Therapy (Addendum)
OUTPATIENT PHYSICAL THERAPY TREATMENT Progress Note  Reporting 06/05/23-08/05/23  Patient Name: Travis Fitzpatrick MRN: [id number] DOB:Oct 11, 1980, 42 y.o., male Today's Date: 08/05/23   END OF SESSION:  PT End of Session - 08/05/23 1743     Visit Number 40    Number of Visits 43    Date for PT Re-Evaluation 08/26/23    Authorization Type MEDICARE PART B reporting period from 06/05/2023    Progress Note Due on Visit 40    PT Start Time 1530    PT Stop Time 1610    PT Time Calculation (min) 40 min    Activity Tolerance Patient tolerated treatment well;No increased pain    Behavior During Therapy [facility] for tasks assessed/performed                   Past Medical History:  Diagnosis Date   Bowel trouble    urgency   Medical history non-contributory    Urinary urgency    Past Surgical History:  Procedure Laterality Date   BACK SURGERY  2010   CIRCUMCISION     LUMBAR LAMINECTOMY/DECOMPRESSION MICRODISCECTOMY  07/20/2011   Procedure: LUMBAR LAMINECTOMY/DECOMPRESSION MICRODISCECTOMY;  Surgeon: Carmela Hurt;  Location: [facility];  Service: Neurosurgery;  Laterality: N/A;  right thoracotomy with thoracic eight-nine discectomy and fusion   LUMBAR LAMINECTOMY/DECOMPRESSION MICRODISCECTOMY Right 10/06/2021   Procedure: Right Lumbar Four-Five Microdiscectomy, Right Lumbar Five-Sacal One Foraminotomy;  Surgeon: Coletta Memos, MD;  Location: [facility];  Service: Neurosurgery;  Laterality: Right;  3C/RM 21   THORACIC DISCECTOMY  07/16/2012   Procedure: THORACIC DISCECTOMY;  Surgeon: Carmela Hurt, MD;  Location: [facility];  Service: Neurosurgery;  Laterality: Right;  RIGHT Thoracic seven-eight  thoracic diskectomy via thoracotomy by dr Laneta Simmers   THORACIC DISCECTOMY N/A 12/15/2014   Procedure: THORACIC SEVEN TO THORACIC NINE Laminectomy ;  Surgeon: Coletta Memos, MD;  Location: [facility];  Service: Neurosurgery;  Laterality: N/A;   THORACIC DISCECTOMY N/A 06/06/2017   Procedure:  LAMINECTOMY THORACIC NINE-TEN;  Surgeon: Coletta Memos, MD;  Location: [facility];  Service: Neurosurgery;  Laterality: N/A;  LAMINECTOMY THORACIC NINE-TEN   THORACOTOMY  07/20/2011   Procedure: THORACOTOMY OPEN FOR SPINE SURGERY;  Surgeon: Norton Blizzard, MD;  Location: [facility];  Service: Vascular;  Laterality: N/A;   THORACOTOMY  07/16/2012   Procedure: THORACOTOMY OPEN FOR SPINE SURGERY;  Surgeon: Alleen Borne, MD;  Location: [facility];  Service: Thoracic;  Laterality: N/A;   Patient Active Problem List   Diagnosis Date Noted   Nerve pain 12/05/2022   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   S/P lumbar spinal fusion 05/25/2022   Synovial cyst of lumbar facet joint 05/25/2022   HNP (herniated nucleus pulposus), lumbar 10/06/2021   Hand pain 06/28/2021   Lumbar facet arthropathy 01/11/2021   Thoracic spondylosis with myelopathy 01/11/2021   Thoracic spinal stenosis 06/06/2017   Stenosis, spinal, thoracic 12/15/2014   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 09/22/2014   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/2011    PCP: Nira Retort  REFERRING PROVIDER: Ranelle Oyster, MD  REFERRING DIAG: thoracic disc disease with myelopathy, nerve pain, adhesive arachnoiditis  Rationale for Evaluation and Treatment: Rehabilitation  THERAPY DIAG:  Other low back pain  Other symptoms and signs involving the nervous system  Radiculopathy, lumbar region  Muscle weakness (generalized)  ONSET DATE:  Suddenly started having weakness 11 years ago, most recent episode of worsening October 2022, s/p right L4-5 laminotomy, microdiscectomy  on 10/06/2021, now s/p L4-5 PLIF by Dr. Franky Macho on 05/25/2022.  PERTINENT HISTORY:  Patient is a 42 y.o. male who presents to outpatient physical therapy with a referral for medical diagnosis thoracic disc disease with myelopathy, nerve pain, adhesive arachnoiditis. This patient's chief complaints consist of disabling low back  and R >L leg pain and weakness with activity, bowel and bladder urgency, incontinence, and retention, leading to the following functional deficits: difficulty with or unable to complete any activity that requires weight bearing, use of B LE, and/or balance including working, household and community mobility, walking, driving, going to family gatherings, avoiding incontinent episodes, playing with daughter, helping around the house, bed mobility, transfers. 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 Franky Macho on 05/25/2022, adhesive arachnoiditis, nerve pain.  Patient denies hx of cancer, stroke, seizures, lung problems, heart problems, diabetes, unexplained weight loss, and osteoporosis.  SUBJECTIVE:                                                                                                                                                                                           SUBJECTIVE STATEMENT:     Pt felts sinck again last weak, nauseated, light sensitive, headache. He is waiting for a call back from MD still. He feel sbetter today.   PAIN:  NPRS: 4/10 mostly in low back (stabbing/pressure), bilateral posterior thighs, right calf right shin, and right hip/groin. Also new pain stinging in the right posterior hip when he weight bears since last PT session.   PRECAUTIONS: Fall  PATIENT GOALS: "To get better, walk, drive again, climb stairs, play with my daughter, help around the house again"  NEXT MD VISIT: 03/13/2023 follow up with Dr. Riley Kill.   OBJECTIVE   TODAY'S TREATMENT:      -STS from elevated surface (24" height, BRW as needed) 2x10   -seated marching 1x30 (alternating high surface)  -side stepping along treadmill bar 2x94ft  -AMB overground 278ft c BRW  *seated recovery  -AMB overground 12ft c BRW (0.30m/s)  -*seated recovery   - seated overhead pull down (B shoulder extension from ~120  degrees flexion to bar on thighs) with cable bar x20  - seated overhead pull down (B shoulder extension from ~120  degrees flexion to bar on thighs) with cable bar  x20   - seated CHEST press with sled bar + 7.5lb AW 3x15 - seated CHEST press with sled bar + 7.5lb AW 3x15   Pt required multimodal cuing for proper technique and to facilitate improved neuromuscular control, strength, range of motion, and functional ability resulting in improved performance and form.   PATIENT EDUCATION:  Education details: Exercise purpose/form. Self management techniques.  Person educated: patient Education method: Explanation Education comprehension: verbalized understanding and needs further education  HOME EXERCISE PROGRAM: Verbally:  - seated lumbar flexion stretch with head down and one LE extended using rollator, 1x10 each side.  - curl up with legs elevated, 5x15 seconds - sidelying open book 1x10 each side  Access Code: [email or web address] URL: [email or web address] Date: 06/24/2023 Prepared by: Norton Blizzard  Exercises - Median Nerve Flossing - Tray  - 3 x daily - 10 reps - Seated Cervical Retraction  - 1-2 x daily - 3 sets - 10 reps  ASSESSMENT:  CLINICAL IMPRESSION:     Continued to work on basics today, pt still having shooting pain in hips with AMB but overall is functioning well compared to his recent levels of mobility. Rest breaks provided as needed throughout session. Patient was able to tolerate interventions throughout entire session. Overall pt continues to show gradual progression in functional activity tolerance, but  remains remarkably limited  compared to baseline. Patient would benefit from continued management of limiting condition by skilled physical therapist to address remaining impairments and functional limitations to work towards stated goals and return to PLOF or maximal functional independence.   OBJECTIVE IMPAIRMENTS: Abnormal gait, decreased activity tolerance, decreased balance, decreased coordination, decreased endurance, decreased knowledge of condition, decreased knowledge of use of DME, decreased mobility, difficulty walking, decreased ROM, decreased strength, increased edema, impaired perceived functional ability, increased muscle spasms, impaired flexibility, impaired sensation, impaired tone, improper body mechanics, postural dysfunction, obesity, and pain.   ACTIVITY LIMITATIONS: carrying, lifting, bending, standing, squatting, stairs, transfers, bed mobility, continence, bathing, toileting, dressing, hygiene/grooming, locomotion level, and caring for others  PARTICIPATION LIMITATIONS: meal prep, cleaning, laundry, interpersonal relationship, driving, shopping, community activity, occupation, yard work, and   difficulty with or unable to complete any activity that requires weight bearing, use of B LE, and/or balance including working, household and community mobility, walking, driving, going to family gatherings, avoiding incontinent episodes, playing with daughter, helping around the house, bed mobility, transfers  PERSONAL FACTORS: Past/current experiences, Time since onset of injury/illness/exacerbation, and 3+ comorbidities:   3 thoracic spine surgeries, thoracic MRI notes "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 Franky Macho on 05/25/2022, adhesive arachnoiditis, nerve pain are also affecting patient's functional outcome.   REHAB POTENTIAL: Fair due to severity and nature of condition.   CLINICAL DECISION MAKING: Evolving/moderate complexity  EVALUATION COMPLEXITY: Moderate   GOALS: Goals reviewed with patient? No  SHORT TERM GOALS: Target date: 01/09/2023. Target date updated to 05/28/2023 for all unmet goals on 03/05/2023.   Patient will be independent with initial home exercise program for self-management of symptoms. Baseline: Initial HEP to be provided at visit 2 as appropriate (12/26/22); Goal status: MET   LONG TERM GOALS: Target date: 03/20/2023. Target date updated to 08/26/23  for all unmet goals on 06/03/2023.  Patient will be independent with a long-term home exercise program for self-management of symptoms.  Baseline: Initial HEP to be provided at visit 2 as appropriate (12/26/22); participating as able (01/31/2023); patient currently participating as tolerated (03/05/2023; 04/09/2023; 06/03/2023);   Goal status: In-progress  2.  Patient will demonstrate improved FOTO by equal or greater than 10 points by visit #13 to demonstrate improvement in overall condition and self-reported functional ability.  Baseline: to be tested visit 2 as appropriate (12/26/22); 30 at visit #3 (01/02/2023); 46 at visit #13 (03/05/2023); 45 at visit #29 (06/03/2023); Goal status: MET  3.  Patient will demonstrate the ability to ambulate equal or greater than 600 feet with LRAD during the 6 minute walk to improve his household and community mobility.  Baseline: 34 feet with bari-RW (12/26/22); 200 feet with bari-RW (01/31/2023); 500 feet with BRW (03/05/2023); 400 feet with BRW (04/09/2023); 152 feet with BRW and W/C follow for safety (06/03/2023);  Goal status: Ongoing  4.  Patient will complete 5 Time Sit To Stand Test from 19.5 inch surface or lower in equal or less than 15 seconds with no UE support to  demonstrate improved transfer ability for improved household and toileting mobility.  Baseline: 23 seconds with heavy B UE support on RW from 19.5 inch plinth. Pain throbbing down the right LE.  (12/26/22); 19 seconds with heavy B UE support on RW from 19.5 inch plinth (03/05/2023); 20 seconds with heavy B UE support on RRW from 18.5 inch plinth. Painful in back of B calves, thighs, and lower back (04/09/2023); 15 seconds with heavy B UE support on BRW from 18.5 inch plinth. Painful in back of B calves, thighs, and lower back (06/03/2023);  Goal status: In-progress  5.  Patient will report worst pain equal or less than 3/10 with functional activities to improve his ability to complete basic household and community mobility. Baseline: up to 8/10 (12/26/22); reports 3/10 pain (01/31/2023); reports 4/10 pain (02/19/2023); up to 7/10 in the last 2 week s(03/05/2023): up to  7/10 in the last two weeks (04/09/2023); up to 8/10 in the last 2 weeks (06/03/2023);  Goal status: Ongoing   PLAN:  PT FREQUENCY: 1-2x/week  PT DURATION: 12 weeks  PLANNED INTERVENTIONS: Therapeutic exercises, Therapeutic activity, Neuromuscular re-education, Balance training, Gait training, Patient/Family education, Self Care, Joint mobilization, Stair training, Orthotic/Fit training, DME instructions, Aquatic Therapy, Dry Needling, Electrical stimulation, Wheelchair mobility training, Spinal mobilization, Cryotherapy, Moist heat, Manual therapy, and Re-evaluation.  PLAN FOR NEXT SESSION:  update HEP as appropriate, neurodynamics, gait training, LE/core/functional strengthening, stretching, and balance as tolerated, education, manual therapy as needed.  Rosamaria Lints, PT, DPT 08/05/2023, 5:45 PM   [facility] [address] [place], [place] [address] P: [phone] I F: [phone]

## 2023-08-08 ENCOUNTER — Ambulatory Visit: Payer: Medicare Other | Admitting: Physical Therapy

## 2023-08-12 ENCOUNTER — Ambulatory Visit: Payer: Medicare Other | Admitting: Physical Therapy

## 2023-08-12 DIAGNOSIS — R29818 Other symptoms and signs involving the nervous system: Secondary | ICD-10-CM

## 2023-08-12 DIAGNOSIS — M6281 Muscle weakness (generalized): Secondary | ICD-10-CM

## 2023-08-12 DIAGNOSIS — R296 Repeated falls: Secondary | ICD-10-CM

## 2023-08-12 DIAGNOSIS — M5416 Radiculopathy, lumbar region: Secondary | ICD-10-CM

## 2023-08-12 DIAGNOSIS — M5459 Other low back pain: Secondary | ICD-10-CM

## 2023-08-12 DIAGNOSIS — R262 Difficulty in walking, not elsewhere classified: Secondary | ICD-10-CM

## 2023-08-12 NOTE — Therapy (Signed)
OUTPATIENT PHYSICAL THERAPY TREATMENT   Patient Name: Travis Fitzpatrick MRN: [id number] DOB:1980-11-04, 42 y.o., male Today's Date: 08/12/23   END OF SESSION:  PT End of Session - 08/12/23 1645     Visit Number 41    Number of Visits 50    Date for PT Re-Evaluation 08/26/23    Authorization Type MEDICARE PART B reporting period from 08/05/2023    Progress Note Due on Visit 50    PT Start Time 1606    PT Stop Time 1650    PT Time Calculation (min) 44 min    Activity Tolerance Patient tolerated treatment well;Patient limited by pain    Behavior During Therapy [facility] for tasks assessed/performed                    Past Medical History:  Diagnosis Date   Bowel trouble    urgency   Medical history non-contributory    Urinary urgency    Past Surgical History:  Procedure Laterality Date   BACK SURGERY  2010   CIRCUMCISION     LUMBAR LAMINECTOMY/DECOMPRESSION MICRODISCECTOMY  07/20/2011   Procedure: LUMBAR LAMINECTOMY/DECOMPRESSION MICRODISCECTOMY;  Surgeon: Carmela Hurt;  Location: [facility];  Service: Neurosurgery;  Laterality: N/A;  right thoracotomy with thoracic eight-nine discectomy and fusion   LUMBAR LAMINECTOMY/DECOMPRESSION MICRODISCECTOMY Right 10/06/2021   Procedure: Right Lumbar Four-Five Microdiscectomy, Right Lumbar Five-Sacal One Foraminotomy;  Surgeon: Coletta Memos, MD;  Location: [facility];  Service: Neurosurgery;  Laterality: Right;  3C/RM 21   THORACIC DISCECTOMY  07/16/2012   Procedure: THORACIC DISCECTOMY;  Surgeon: Carmela Hurt, MD;  Location: [facility];  Service: Neurosurgery;  Laterality: Right;  RIGHT Thoracic seven-eight  thoracic diskectomy via thoracotomy by dr Laneta Simmers   THORACIC DISCECTOMY N/A 12/15/2014   Procedure: THORACIC SEVEN TO THORACIC NINE Laminectomy ;  Surgeon: Coletta Memos, MD;  Location: [facility];  Service: Neurosurgery;  Laterality: N/A;   THORACIC DISCECTOMY N/A 06/06/2017   Procedure: LAMINECTOMY THORACIC NINE-TEN;  Surgeon:  Coletta Memos, MD;  Location: [facility];  Service: Neurosurgery;  Laterality: N/A;  LAMINECTOMY THORACIC NINE-TEN   THORACOTOMY  07/20/2011   Procedure: THORACOTOMY OPEN FOR SPINE SURGERY;  Surgeon: Norton Blizzard, MD;  Location: [facility];  Service: Vascular;  Laterality: N/A;   THORACOTOMY  07/16/2012   Procedure: THORACOTOMY OPEN FOR SPINE SURGERY;  Surgeon: Alleen Borne, MD;  Location: [facility];  Service: Thoracic;  Laterality: N/A;   Patient Active Problem List   Diagnosis Date Noted   Nerve pain 12/05/2022   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   S/P lumbar spinal fusion 05/25/2022   Synovial cyst of lumbar facet joint 05/25/2022   HNP (herniated nucleus pulposus), lumbar 10/06/2021   Hand pain 06/28/2021   Lumbar facet arthropathy 01/11/2021   Thoracic spondylosis with myelopathy 01/11/2021   Thoracic spinal stenosis 06/06/2017   Stenosis, spinal, thoracic 12/15/2014   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 09/22/2014   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/2011    PCP: Nira Retort  REFERRING PROVIDER: Ranelle Oyster, MD  REFERRING DIAG: thoracic disc disease with myelopathy, nerve pain, adhesive arachnoiditis  Rationale for Evaluation and Treatment: Rehabilitation  THERAPY DIAG:  Other low back pain  Other symptoms and signs involving the nervous system  Radiculopathy, lumbar region  Muscle weakness (generalized)  Repeated falls  Difficulty in walking, not elsewhere classified  ONSET DATE:  Suddenly started having weakness 11 years ago, most recent episode of  worsening October 2022, s/p right L4-5 laminotomy, microdiscectomy on 10/06/2021, now s/p L4-5 PLIF by Dr. Franky Macho on 05/25/2022.  PERTINENT HISTORY:  Patient is a 42 y.o. male who presents to outpatient physical therapy with a referral for medical diagnosis thoracic disc disease with myelopathy, nerve pain, adhesive arachnoiditis. This patient's chief complaints  consist of disabling low back and R >L leg pain and weakness with activity, bowel and bladder urgency, incontinence, and retention, leading to the following functional deficits: difficulty with or unable to complete any activity that requires weight bearing, use of B LE, and/or balance including working, household and community mobility, walking, driving, going to family gatherings, avoiding incontinent episodes, playing with daughter, helping around the house, bed mobility, transfers. 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 Franky Macho on 05/25/2022, adhesive arachnoiditis, nerve pain.  Patient denies hx of cancer, stroke, seizures, lung problems, heart problems, diabetes, unexplained weight loss, and osteoporosis.  SUBJECTIVE:                                                                                                                                                                                           SUBJECTIVE STATEMENT:     Patient states that after last PT session it was rough. He states as soon as he got home he had a lot of pain and throbbing in his lower back, the back of both legs and R groin area. He missed his last PT appointment because he did not have a ride (his wife was out of town). Right now he is having sort of a good day. He still has pain in his lower back between 2-3/10. He states things were more painful  without the hip mobilization.   PAIN:  NPRS: 2-3/10 mostly in low back (stabbing/pressure). Still has pain bilateral posterior thighs, right calf right shin,  and right hip/groin. Continues with stinging in the right posterior hip when he weight bears.   PRECAUTIONS: Fall  PATIENT GOALS: "To get better, walk, drive again, climb stairs, play with my daughter, help around the house again"  NEXT MD VISIT: 03/13/2023 follow up with Dr. Riley Kill.   OBJECTIVE   TODAY'S TREATMENT:    Manual therapy: to reduce pain and tissue tension, improve range of motion, neuromodulation, in order to promote improved  ability to complete functional activities. Hooklying R hip caudal/posterior glide with belt, 4x30-45 seconds, grade IV  Therapeutic activities: dynamic activities for functional strengthening and improved functional activity tolerance. side stepping along treadmill bar with B UE support 2x5 feet each way seated rest 4x5 feet each way Seated rest 4x5 feet each way  Ambulation approx 100 feet down ramp from clinic to vehicle with BRW and W/C follow for safety  Therapeutic exercise: to centralize symptoms and improve ROM, strength, muscular endurance, and activity tolerance required for successful completion of functional activities.  seated hip flexion from elevated surface (nustep seat, 23 inches) with B UE support tapping 6 BlazePods set on random (with 2 distraction colors), as many taps as possible in 60 seconds using both feet.  1x60 seconds (taps recorded 25) 1x2 min (taps recorded: 38, )   seated overhead pull down (B shoulder extension from ~120  degrees flexion to bar on thighs) with cable bar  1x20 at 15#   seated CHEST press with sled bar (8.8#) + 7.5# AW 1x16  Periodic rest breaks in lumbar flexion with UE supported   Pt required multimodal cuing for proper technique and to facilitate improved neuromuscular control, strength, range of motion, and functional ability resulting in improved performance and form.   PATIENT EDUCATION:  Education details: Exercise purpose/form. Self management techniques.  Person educated:  patient Education method: Explanation Education comprehension: verbalized understanding and needs further education  HOME EXERCISE PROGRAM: Verbally:  - seated lumbar flexion stretch with head down and one LE extended using rollator, 1x10 each side.  - curl up with legs elevated, 5x15 seconds - sidelying open book 1x10 each side  Access Code: [email or web address] URL: [email or web address] Date: 06/24/2023 Prepared by: Norton Blizzard  Exercises - Median Nerve Flossing - Tray  - 3 x daily - 10 reps - Seated Cervical Retraction  - 1-2 x daily - 3 sets - 10 reps  ASSESSMENT:  CLINICAL IMPRESSION:     Patient arrives reporting flair in pain after last PT session but not that stopped him from coming to his next appointment (was limited by lack of transportation instead). Continued with standing exercises as tolerated and exercises for core and hip strength as tolerated. Manual therapy continued for right hip, with pain relief noted. However, he continues to be limited in his activity tolerance. Patient would benefit from continued management of limiting condition by skilled physical therapist to address remaining impairments and functional limitations to work towards stated goals and return to PLOF or maximal functional independence.  OBJECTIVE IMPAIRMENTS: Abnormal gait, decreased activity tolerance, decreased balance, decreased coordination, decreased endurance, decreased knowledge of condition, decreased knowledge of use of DME, decreased mobility, difficulty walking, decreased ROM, decreased strength, increased edema, impaired perceived functional ability, increased muscle spasms, impaired flexibility, impaired sensation, impaired tone, improper body mechanics, postural dysfunction, obesity, and pain.   ACTIVITY LIMITATIONS: carrying, lifting, bending, standing, squatting, stairs, transfers, bed mobility, continence, bathing, toileting, dressing, hygiene/grooming, locomotion level, and caring  for others  PARTICIPATION LIMITATIONS: meal prep, cleaning, laundry, interpersonal relationship, driving, shopping, community activity, occupation, yard work, and   difficulty with or unable to complete any activity that requires weight bearing, use of B LE, and/or balance including working, household and community mobility, walking, driving, going to family gatherings, avoiding incontinent episodes, playing with daughter, helping around the house, bed mobility, transfers  PERSONAL FACTORS: Past/current experiences, Time since onset of injury/illness/exacerbation, and 3+ comorbidities:   3 thoracic spine surgeries, thoracic MRI notes "Myelomalacia with severe  cord atrophy from T7 through T9-10 and mild decreased volume the remainder of the thoracic cord, stable" s/p right L4-5 laminotomy, microdiscectomy on 10/06/2021, 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 Franky Macho on 05/25/2022, adhesive arachnoiditis, nerve pain are also affecting patient's functional outcome.   REHAB POTENTIAL: Fair due to severity and nature of condition.   CLINICAL DECISION MAKING: Evolving/moderate complexity  EVALUATION COMPLEXITY: Moderate   GOALS: Goals reviewed with patient? No  SHORT TERM GOALS: Target date: 01/09/2023. Target date updated to 05/28/2023 for all unmet goals on 03/05/2023.   Patient will be independent with initial home exercise program for self-management of symptoms. Baseline: Initial HEP to be provided at visit 2 as appropriate (12/26/22); Goal status: MET   LONG TERM GOALS: Target date: 03/20/2023. Target date updated to 08/26/23  for all unmet goals on 06/03/2023.   Patient will be independent with a long-term home exercise program for self-management of symptoms.  Baseline: Initial HEP to be provided at visit 2 as appropriate (12/26/22); participating as able  (01/31/2023); patient currently participating as tolerated (03/05/2023; 04/09/2023; 06/03/2023);   Goal status: In-progress  2.  Patient will demonstrate improved FOTO by equal or greater than 10 points by visit #13 to demonstrate improvement in overall condition and self-reported functional ability.  Baseline: to be tested visit 2 as appropriate (12/26/22); 30 at visit #3 (01/02/2023); 46 at visit #13 (03/05/2023); 45 at visit #29 (06/03/2023); Goal status: MET  3.  Patient will demonstrate the ability to ambulate equal or greater than 600 feet with LRAD during the 6 minute walk to improve his household and community mobility.  Baseline: 34 feet with bari-RW (12/26/22); 200 feet with bari-RW (01/31/2023); 500 feet with BRW (03/05/2023); 400 feet with BRW (04/09/2023); 152 feet with BRW and W/C follow for safety (06/03/2023);  Goal status: Ongoing  4.  Patient will complete 5 Time Sit To Stand Test from 19.5 inch surface or lower in equal or less than 15 seconds with no UE support to demonstrate improved transfer ability for improved household and toileting mobility.  Baseline: 23 seconds with heavy B UE support on RW from 19.5 inch plinth. Pain throbbing down the right LE.  (12/26/22); 19 seconds with heavy B UE support on RW from 19.5 inch plinth (03/05/2023); 20 seconds with heavy B UE support on RRW from 18.5 inch plinth. Painful in back of B calves, thighs, and lower back (04/09/2023); 15 seconds with heavy B UE support on BRW from 18.5 inch plinth. Painful in back of B calves, thighs, and lower back (06/03/2023);  Goal status: In-progress  5.  Patient will report worst pain equal or less than 3/10 with functional activities to improve his ability to complete basic household and community mobility. Baseline: up to 8/10 (12/26/22); reports 3/10 pain (01/31/2023); reports 4/10 pain (02/19/2023); up to 7/10 in the last 2 week s(03/05/2023): up to  7/10 in the last two weeks (04/09/2023); up to 8/10 in the last 2  weeks (06/03/2023);  Goal status: Ongoing   PLAN:  PT FREQUENCY: 1-2x/week  PT DURATION: 12 weeks  PLANNED INTERVENTIONS: Therapeutic exercises, Therapeutic activity, Neuromuscular re-education, Balance training, Gait training, Patient/Family education, Self Care, Joint mobilization, Stair training, Orthotic/Fit training, DME instructions, Aquatic Therapy, Dry Needling, Electrical stimulation, Wheelchair mobility training, Spinal mobilization, Cryotherapy, Moist heat, Manual therapy, and Re-evaluation.  PLAN FOR NEXT SESSION:  update HEP as appropriate, neurodynamics,  gait training, LE/core/functional strengthening, stretching, and balance as tolerated, education, manual therapy as needed.  Luretha Murphy. Ilsa Iha, PT, DPT 08/12/23, 5:06 PM  [facility] [address] [place], [place] [address] P: [phone] I F: [phone]

## 2023-08-15 ENCOUNTER — Ambulatory Visit: Payer: Medicare Other | Admitting: Physical Therapy

## 2023-08-19 ENCOUNTER — Ambulatory Visit: Payer: Medicare Other | Attending: Physical Medicine & Rehabilitation | Admitting: Physical Therapy

## 2023-08-19 DIAGNOSIS — M5459 Other low back pain: Secondary | ICD-10-CM | POA: Diagnosis present

## 2023-08-19 DIAGNOSIS — R262 Difficulty in walking, not elsewhere classified: Secondary | ICD-10-CM | POA: Diagnosis present

## 2023-08-19 DIAGNOSIS — R29818 Other symptoms and signs involving the nervous system: Secondary | ICD-10-CM | POA: Diagnosis present

## 2023-08-19 DIAGNOSIS — M6281 Muscle weakness (generalized): Secondary | ICD-10-CM

## 2023-08-19 DIAGNOSIS — R296 Repeated falls: Secondary | ICD-10-CM | POA: Diagnosis present

## 2023-08-19 DIAGNOSIS — M5416 Radiculopathy, lumbar region: Secondary | ICD-10-CM | POA: Diagnosis present

## 2023-08-19 NOTE — Therapy (Signed)
 OUTPATIENT PHYSICAL THERAPY TREATMENT   Patient Name: Travis Fitzpatrick MRN: [id number] DOB:08-Aug-1981, 43 y.o., male Today's Date: 08/20/23   END OF SESSION:  PT End of Session - 08/20/23 1947     Visit Number 42    Number of Visits 50    Date for PT Re-Evaluation 08/26/23    Authorization Type MEDICARE PART B reporting period from 08/05/2023    Progress Note Due on Visit 50    PT Start Time 1605    PT Stop Time 1645    PT Time Calculation (min) 40 min    Activity Tolerance Patient tolerated treatment well;Patient limited by pain    Behavior During Therapy [facility] for tasks assessed/performed               Past Medical History:  Diagnosis Date   Bowel trouble    urgency   Medical history non-contributory    Urinary urgency    Past Surgical History:  Procedure Laterality Date   BACK SURGERY  2010   CIRCUMCISION     LUMBAR LAMINECTOMY/DECOMPRESSION MICRODISCECTOMY  07/20/2011   Procedure: LUMBAR LAMINECTOMY/DECOMPRESSION MICRODISCECTOMY;  Surgeon: Rockey LITTIE Peru;  Location: [facility];  Service: Neurosurgery;  Laterality: N/A;  right thoracotomy with thoracic eight-nine discectomy and fusion   LUMBAR LAMINECTOMY/DECOMPRESSION MICRODISCECTOMY Right 10/06/2021   Procedure: Right Lumbar Four-Five Microdiscectomy, Right Lumbar Five-Sacal One Foraminotomy;  Surgeon: Peru Rockey, MD;  Location: [facility];  Service: Neurosurgery;  Laterality: Right;  3C/RM 21   THORACIC DISCECTOMY  07/16/2012   Procedure: THORACIC DISCECTOMY;  Surgeon: Rockey LITTIE Peru, MD;  Location: [facility];  Service: Neurosurgery;  Laterality: Right;  RIGHT Thoracic seven-eight  thoracic diskectomy via thoracotomy by dr lucas   THORACIC DISCECTOMY N/A 12/15/2014   Procedure: THORACIC SEVEN TO THORACIC NINE Laminectomy ;  Surgeon: Rockey Peru, MD;  Location: [facility];  Service: Neurosurgery;  Laterality: N/A;   THORACIC DISCECTOMY N/A 06/06/2017   Procedure: LAMINECTOMY THORACIC NINE-TEN;  Surgeon: [place] Rockey, MD;  Location: [facility];  Service: Neurosurgery;  Laterality: N/A;  LAMINECTOMY THORACIC NINE-TEN   THORACOTOMY  07/20/2011   Procedure: THORACOTOMY OPEN FOR SPINE SURGERY;  Surgeon: JONETTA Belvie Nam, MD;  Location: [facility];  Service: Vascular;  Laterality: N/A;   THORACOTOMY  07/16/2012   Procedure: THORACOTOMY OPEN FOR SPINE SURGERY;  Surgeon: Dorise MARLA Lucas, MD;  Location: [facility];  Service: Thoracic;  Laterality: N/A;   Patient Active Problem List   Diagnosis Date Noted   Nerve pain 12/05/2022   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   S/P lumbar spinal fusion 05/25/2022   Synovial cyst of lumbar facet joint 05/25/2022   HNP (herniated nucleus pulposus), lumbar 10/06/2021   Hand pain 06/28/2021   Lumbar facet arthropathy 01/11/2021   Thoracic spondylosis with myelopathy 01/11/2021   Thoracic spinal stenosis 06/06/2017   Stenosis, spinal, thoracic 12/15/2014   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 09/22/2014   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/2011    PCP: Maryl Sayres  REFERRING PROVIDER: Babs Arthea DASEN, MD  REFERRING DIAG: thoracic disc disease with myelopathy, nerve pain, adhesive arachnoiditis  Rationale for Evaluation and Treatment: Rehabilitation  THERAPY DIAG:  Other low back pain  Other symptoms and signs involving the nervous system  Radiculopathy, lumbar region  Muscle weakness (generalized)  Repeated falls  Difficulty in walking, not elsewhere classified  ONSET DATE:  Suddenly started having weakness 11 years ago, most recent episode of worsening October 2022, s/p right  L4-5 laminotomy, microdiscectomy on 10/06/2021, now s/p L4-5 PLIF by Dr. Gillie on 05/25/2022.  PERTINENT HISTORY:  Patient is a 43 y.o. male who presents to outpatient physical therapy with a referral for medical diagnosis thoracic disc disease with myelopathy, nerve pain, adhesive arachnoiditis. This patient's chief complaints consist of  disabling low back and R >L leg pain and weakness with activity, bowel and bladder urgency, incontinence, and retention, leading to the following functional deficits: difficulty with or unable to complete any activity that requires weight bearing, use of B LE, and/or balance including working, household and community mobility, walking, driving, going to family gatherings, avoiding incontinent episodes, playing with daughter, helping around the house, bed mobility, transfers. 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 Gillie on 05/25/2022, adhesive arachnoiditis, nerve pain.  Patient denies hx of cancer, stroke, seizures, lung problems, heart problems, diabetes, unexplained weight loss, and osteoporosis.  SUBJECTIVE:                                                                                                                                                                                           SUBJECTIVE STATEMENT:     Patient states that after last PT session he had to stay in bed for a day due to too much pain with weight bearing. Then he was okay. He states he is making it today. He states his back has been more painful than usual. He has had near falls but not falls.   PAIN:  NPRS: 5/10 mostly in low back (stabbing/pressure). Still has pain bilateral posterior thighs, right calf right shin, and right hip/groin. Continues with intermittent stinging in the right posterior hip when he weight bears (worse with more with).  PRECAUTIONS: Fall  PATIENT GOALS: To get better, walk,  drive again, climb stairs, play with my daughter, help around the house again  NEXT MD VISIT: 03/13/2023 follow up with Dr. Babs.   OBJECTIVE   TODAY'S TREATMENT:    Manual therapy: to reduce pain and tissue tension, improve range of motion, neuromodulation, in order to promote improved ability to complete functional activities. Hooklying R hip caudal/posterior glide with belt, 4x30-45 seconds, grade IV  Therapeutic activities: dynamic activities for functional strengthening and improved functional activity tolerance. side stepping along treadmill bar with B UE  support 3x5 feet each way seated rest 4x5 feet each way Seated rest 5x5 feet each way  Ambulation approx 100 feet around the clinic, then 100 feet down ramp from clinic to vehicle with BRW and W/C follow for safety  Therapeutic exercise: to centralize symptoms and improve ROM, strength, muscular endurance, and activity tolerance required for successful completion of functional activities.  seated CHEST press with sled bar (8.8#) + 7.5# AW 3x15  Seated deadlift with KB (seated on 22.5 inch surface), 20# KB to 4 inch yoga block.  Sets of 2-5 reps with brief seated rests until reached 14 total. Discontinued due to increasing back pain.   Seated lumbar flexion stretch leaning on RW/TM bar between exercises for pain relief.   Pt required multimodal cuing for proper technique and to facilitate improved neuromuscular control, strength, range of motion, and functional ability resulting in improved performance and form.   PATIENT EDUCATION:  Education details: Exercise purpose/form. Self management techniques.  Person educated: patient Education method: Explanation Education comprehension: verbalized understanding and needs further education  HOME EXERCISE PROGRAM: Verbally:  - seated lumbar flexion stretch with head down and one LE extended using rollator, 1x10 each side.  - curl up with legs elevated, 5x15 seconds -  sidelying open book 1x10 each side  Access Code: [email or web address] URL: [email or web address] Date: 06/24/2023 Prepared by: Camie Cleverly  Exercises - Median Nerve Flossing - Tray  - 3 x daily - 10 reps - Seated Cervical Retraction  - 1-2 x daily - 3 sets - 10 reps  ASSESSMENT:  CLINICAL IMPRESSION:     Patient arrives reporting pain that kept him in bed for one day after last PT session. He continues to get some relief in the right hip from joint distraction techniques. Today's session focused on pain relief of the right hip and exercises for improved core, LE, and functional strength as tolerated. Patient continues to be limited in how many exercises he can tolerate with worsening pain with weight bearing, trunk extension, and activities that increase intra-abdominal pressure. Patient would benefit from continued management of limiting condition by skilled physical therapist to address remaining impairments and functional limitations to work towards stated goals and return to PLOF or maximal functional independence.    OBJECTIVE IMPAIRMENTS: Abnormal gait, decreased activity tolerance, decreased balance, decreased coordination, decreased endurance, decreased knowledge of condition, decreased knowledge of use of DME, decreased mobility, difficulty walking, decreased ROM, decreased strength, increased edema, impaired perceived functional ability, increased muscle spasms, impaired flexibility, impaired sensation, impaired tone, improper body mechanics, postural dysfunction, obesity, and pain.   ACTIVITY LIMITATIONS: carrying, lifting, bending, standing, squatting, stairs, transfers, bed mobility, continence, bathing, toileting, dressing, hygiene/grooming, locomotion level, and caring for others  PARTICIPATION LIMITATIONS: meal prep, cleaning, laundry, interpersonal relationship, driving, shopping, community activity, occupation, yard work, and   difficulty with or unable to complete any  activity that requires weight bearing, use of B LE, and/or balance including working, household and community mobility, walking, driving, going to family gatherings, avoiding incontinent episodes, playing with daughter, helping around the house, bed mobility, transfers  PERSONAL FACTORS: Past/current experiences, Time since onset of injury/illness/exacerbation, and 3+ comorbidities:   3 thoracic spine surgeries, thoracic MRI notes 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 Gillie on 05/25/2022, adhesive arachnoiditis, nerve pain are also affecting patient's functional outcome.   REHAB POTENTIAL: Fair due to severity and nature of condition.   CLINICAL DECISION MAKING: Evolving/moderate complexity  EVALUATION COMPLEXITY: Moderate   GOALS: Goals reviewed with patient? No  SHORT TERM GOALS: Target date: 01/09/2023. Target date updated to 05/28/2023 for all unmet goals on 03/05/2023.   Patient will be independent with initial home exercise program for self-management of symptoms. Baseline: Initial HEP to be provided at visit 2 as appropriate (12/26/22); Goal status: MET   LONG TERM GOALS: Target date: 03/20/2023. Target date updated to 08/26/23  for all unmet goals on 06/03/2023.   Patient will be independent with a long-term home exercise program for self-management of symptoms.  Baseline: Initial HEP to be provided at visit 2 as appropriate (12/26/22); participating as able (01/31/2023); patient currently participating as tolerated (03/05/2023; 04/09/2023; 06/03/2023);   Goal status: In-progress  2.  Patient will demonstrate improved FOTO by equal or greater than 10 points by visit  #13 to demonstrate improvement in overall condition and self-reported functional ability.  Baseline: to be tested visit 2 as appropriate (12/26/22); 30 at visit #3 (01/02/2023); 46 at visit #13 (03/05/2023); 45 at visit #29 (06/03/2023); Goal status: MET  3.  Patient will demonstrate the ability to ambulate equal or greater than 600 feet with LRAD during the 6 minute walk to improve his household and community mobility.  Baseline: 34 feet with bari-RW (12/26/22); 200 feet with bari-RW (01/31/2023); 500 feet with BRW (03/05/2023); 400 feet with BRW (04/09/2023); 152 feet with BRW and W/C follow for safety (06/03/2023);  Goal status: Ongoing  4.  Patient will complete 5 Time Sit To Stand Test from 19.5 inch surface or lower in equal or less than 15 seconds with no UE support to demonstrate improved transfer ability for improved household and toileting mobility.  Baseline: 23 seconds with heavy B UE support on RW from 19.5 inch plinth. Pain throbbing down the right LE.  (12/26/22); 19 seconds with heavy B UE support on RW from 19.5 inch plinth (03/05/2023); 20 seconds with heavy B UE support on RRW from 18.5 inch plinth. Painful in back of B calves, thighs, and lower back (04/09/2023); 15 seconds with heavy B UE support on BRW from 18.5 inch plinth. Painful in back of B calves, thighs, and lower back (06/03/2023);  Goal status: In-progress  5.  Patient will report worst pain equal or less than 3/10 with functional activities to improve his ability to complete basic household and community mobility. Baseline: up to 8/10 (12/26/22); reports 3/10 pain (01/31/2023); reports 4/10 pain (02/19/2023); up to 7/10 in the last 2 week s(03/05/2023): up to  7/10 in the last two weeks (04/09/2023); up to 8/10 in the last 2 weeks (06/03/2023);  Goal status: Ongoing   PLAN:  PT FREQUENCY: 1-2x/week  PT DURATION: 12 weeks  PLANNED INTERVENTIONS: Therapeutic exercises, Therapeutic activity, Neuromuscular re-education, Balance  training, Gait training, Patient/Family education, Self Care, Joint mobilization, Stair training, Orthotic/Fit training, DME instructions, Aquatic Therapy, Dry Needling, Electrical stimulation, Wheelchair mobility training, Spinal mobilization, Cryotherapy, Moist heat, Manual therapy, and Re-evaluation.  PLAN FOR NEXT SESSION:  update HEP as appropriate, neurodynamics, gait training, LE/core/functional strengthening, stretching, and balance as tolerated, education, manual therapy as needed.  Camie SAUNDERS. Juli, PT, DPT 08/20/23, 7:53 PM  [facility] [address] [place], [place] [address] P: [phone] I F: [phone]

## 2023-08-20 ENCOUNTER — Encounter: Payer: Self-pay | Admitting: Physical Therapy

## 2023-08-21 ENCOUNTER — Encounter: Payer: Self-pay | Admitting: Physical Therapy

## 2023-08-21 ENCOUNTER — Ambulatory Visit: Payer: Medicare Other | Admitting: Physical Therapy

## 2023-08-21 DIAGNOSIS — R262 Difficulty in walking, not elsewhere classified: Secondary | ICD-10-CM

## 2023-08-21 DIAGNOSIS — M5416 Radiculopathy, lumbar region: Secondary | ICD-10-CM

## 2023-08-21 DIAGNOSIS — R296 Repeated falls: Secondary | ICD-10-CM

## 2023-08-21 DIAGNOSIS — M5459 Other low back pain: Secondary | ICD-10-CM

## 2023-08-21 DIAGNOSIS — R29818 Other symptoms and signs involving the nervous system: Secondary | ICD-10-CM

## 2023-08-21 DIAGNOSIS — M6281 Muscle weakness (generalized): Secondary | ICD-10-CM

## 2023-08-21 NOTE — Therapy (Signed)
 OUTPATIENT PHYSICAL THERAPY TREATMENT   Patient Name: Travis Fitzpatrick MRN: [id number] DOB:July 20, 1981, 43 y.o., male Today's Date: 08/22/23   END OF SESSION:  PT End of Session - 08/21/23 1610     Visit Number 43    Number of Visits 50    Date for PT Re-Evaluation 08/26/23    Authorization Type MEDICARE PART B reporting period from 08/05/2023    Progress Note Due on Visit 50    PT Start Time 1607    PT Stop Time 1645    PT Time Calculation (min) 38 min    Activity Tolerance Patient tolerated treatment well;Patient limited by pain    Behavior During Therapy [facility] for tasks assessed/performed                Past Medical History:  Diagnosis Date   Bowel trouble    urgency   Medical history non-contributory    Urinary urgency    Past Surgical History:  Procedure Laterality Date   BACK SURGERY  2010   CIRCUMCISION     LUMBAR LAMINECTOMY/DECOMPRESSION MICRODISCECTOMY  07/20/2011   Procedure: LUMBAR LAMINECTOMY/DECOMPRESSION MICRODISCECTOMY;  Surgeon: Rockey LITTIE Peru;  Location: [facility];  Service: Neurosurgery;  Laterality: N/A;  right thoracotomy with thoracic eight-nine discectomy and fusion   LUMBAR LAMINECTOMY/DECOMPRESSION MICRODISCECTOMY Right 10/06/2021   Procedure: Right Lumbar Four-Five Microdiscectomy, Right Lumbar Five-Sacal One Foraminotomy;  Surgeon: Peru Rockey, MD;  Location: [facility];  Service: Neurosurgery;  Laterality: Right;  3C/RM 21   THORACIC DISCECTOMY  07/16/2012   Procedure: THORACIC DISCECTOMY;  Surgeon: Rockey LITTIE Peru, MD;  Location: [facility];  Service: Neurosurgery;  Laterality: Right;  RIGHT Thoracic seven-eight  thoracic diskectomy via thoracotomy by dr lucas   THORACIC DISCECTOMY N/A 12/15/2014   Procedure: THORACIC SEVEN TO THORACIC NINE Laminectomy ;  Surgeon: Rockey Peru, MD;  Location: [facility];  Service: Neurosurgery;  Laterality: N/A;   THORACIC DISCECTOMY N/A 06/06/2017   Procedure: LAMINECTOMY THORACIC NINE-TEN;  Surgeon:  [place]u Rockey, MD;  Location: [facility];  Service: Neurosurgery;  Laterality: N/A;  LAMINECTOMY THORACIC NINE-TEN   THORACOTOMY  07/20/2011   Procedure: THORACOTOMY OPEN FOR SPINE SURGERY;  Surgeon: JONETTA Belvie Nam, MD;  Location: [facility];  Service: Vascular;  Laterality: N/A;   THORACOTOMY  07/16/2012   Procedure: THORACOTOMY OPEN FOR SPINE SURGERY;  Surgeon: Dorise MARLA Lucas, MD;  Location: [facility];  Service: Thoracic;  Laterality: N/A;   Patient Active Problem List   Diagnosis Date Noted   Nerve pain 12/05/2022   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   S/P lumbar spinal fusion 05/25/2022   Synovial cyst of lumbar facet joint 05/25/2022   HNP (herniated nucleus pulposus), lumbar 10/06/2021   Hand pain 06/28/2021   Lumbar facet arthropathy 01/11/2021   Thoracic spondylosis with myelopathy 01/11/2021   Thoracic spinal stenosis 06/06/2017   Stenosis, spinal, thoracic 12/15/2014   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 09/22/2014   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/2011    PCP: Maryl Sayres  REFERRING PROVIDER: Babs Arthea DASEN, MD  REFERRING DIAG: thoracic disc disease with myelopathy, nerve pain, adhesive arachnoiditis  Rationale for Evaluation and Treatment: Rehabilitation  THERAPY DIAG:  Other low back pain  Other symptoms and signs involving the nervous system  Radiculopathy, lumbar region  Muscle weakness (generalized)  Repeated falls  Difficulty in walking, not elsewhere classified  ONSET DATE:  Suddenly started having weakness 11 years ago, most recent episode of worsening October 2022, s/p  right L4-5 laminotomy, microdiscectomy on 10/06/2021, now s/p L4-5 PLIF by Dr. Gillie on 05/25/2022.  PERTINENT HISTORY:  Patient is a 43 y.o. male who presents to outpatient physical therapy with a referral for medical diagnosis thoracic disc disease with myelopathy, nerve pain, adhesive arachnoiditis. This patient's chief complaints  consist of disabling low back and R >L leg pain and weakness with activity, bowel and bladder urgency, incontinence, and retention, leading to the following functional deficits: difficulty with or unable to complete any activity that requires weight bearing, use of B LE, and/or balance including working, household and community mobility, walking, driving, going to family gatherings, avoiding incontinent episodes, playing with daughter, helping around the house, bed mobility, transfers. 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 Gillie on 05/25/2022, adhesive arachnoiditis, nerve pain.  Patient denies hx of cancer, stroke, seizures, lung problems, heart problems, diabetes, unexplained weight loss, and osteoporosis.  SUBJECTIVE:                                                                                                                                                                                           SUBJECTIVE STATEMENT:     Patient states that he had increased pain after last PT session but not so much it kept him in the bed. He states he had a couple of episodes where he nearly fell on his way to PT today. The first time he was coming down the stairs and it felt like his back and right leg gave out. He had to call for his wife to help him up. Then the feeling came back in his leg. Something similar happened again when getting out of the vehcile to come into the clinic as well. He reports doing his supine exercises while at home.   PAIN:  NPRS: 5/10 mostly in low back  (stabbing/pressure). 5/10 pain bilateral posterior thighs, right calf right shin, and right hip/groin. 6/10 pain in right calf and right shin while ambulating. Continues with intermittent stinging in the right posterior hip when he weight bears (worse with more with).  PRECAUTIONS: Fall  PATIENT GOALS: To get better, walk, drive again, climb stairs, play with my daughter, help around the house again  NEXT MD VISIT:   OBJECTIVE   TODAY'S TREATMENT:  Manual therapy: to reduce pain and tissue tension, improve range of motion, neuromodulation, in order to promote improved ability to complete functional activities. Hooklying R hip caudal/posterior glide with belt, 4x30-45 seconds, grade IV  Therapeutic exercise: to centralize symptoms and improve ROM, strength, muscular endurance, and activity tolerance required for successful completion of functional activities.  Seated CHEST press with sled bar (8.8#) + 7.5# AW 3x15  Seated deadlift with KB (seated on 22.5 inch surface), 20# KB to 4 inch yoga block.   1x10   Seated rest  1x10  Seated rest  Discontinued due to increasing back pain.   Seated lumbar flexion roll out with clear theraball, approx 2 min at self selected pace (for pain relief)  Seated hip flexion from elevated surface (22.5 inches) with B UE support tapping 6 BlazePods set on random (with 2 distraction colors), as many taps as possible in specified time using both feet.  1x30 seconds (9 taps) 2x2 min (41/46 taps)  Therapeutic activities: dynamic activities for functional strengthening and improved functional activity tolerance.  side stepping along treadmill bar with B UE support 5x5 feet each way seated rest 5x5 feet each way Seated rest 5x5 feet each way  Ambulation approx 100 feet down ramp from clinic to vehicle with BRW and W/C follow for safety  Pt required multimodal cuing for proper technique and to facilitate improved neuromuscular control, strength, range  of motion, and functional ability resulting in improved performance and form.   PATIENT EDUCATION:  Education details: Exercise purpose/form. Self management techniques.  Person educated: patient Education method: Explanation Education comprehension: verbalized understanding and needs further education  HOME EXERCISE PROGRAM: Verbally:  - seated lumbar flexion stretch with head down and one LE extended using rollator, 1x10 each side.  - curl up with legs elevated, 5x15 seconds - sidelying open book 1x10 each side  Access Code: [email or web address] URL: [email or web address] Date: 06/24/2023 Prepared by: Camie Cleverly  Exercises - Median Nerve Flossing - Tray  - 3 x daily - 10 reps - Seated Cervical Retraction  - 1-2 x daily - 3 sets - 10 reps  ASSESSMENT:  CLINICAL IMPRESSION:     Patient arrives reporting increased pain after last PT session, but not that made him stay in the bed and was therefore better tolerated than usual. Today patient struggling more with sudden R LE weakness and difficulty with weight bearing on R LE, so more seated exercises completed initially. Later in the visit he was able to complete more standing exercises with less discomfort. Utilized W/C follow for safety. Plan to continue with tolerated strengthening and functional exercises next session. Patient would benefit from continued management of limiting condition by skilled physical therapist to address remaining impairments and functional limitations to work towards stated goals and return to PLOF or maximal functional independence.   OBJECTIVE IMPAIRMENTS: Abnormal gait, decreased activity tolerance, decreased balance, decreased coordination, decreased endurance, decreased knowledge of condition, decreased knowledge of use of DME, decreased mobility, difficulty walking, decreased ROM, decreased strength, increased edema, impaired perceived functional ability, increased muscle spasms, impaired flexibility,  impaired sensation, impaired tone, improper body mechanics, postural dysfunction, obesity, and pain.   ACTIVITY LIMITATIONS: carrying, lifting, bending, standing, squatting, stairs, transfers, bed mobility, continence, bathing, toileting, dressing, hygiene/grooming, locomotion level, and caring for others  PARTICIPATION LIMITATIONS: meal prep, cleaning, laundry, interpersonal relationship, driving, shopping, community activity, occupation, yard work, and   difficulty with or unable to complete any activity that requires weight bearing, use of B LE, and/or balance  including working, household and community mobility, walking, driving, going to family gatherings, avoiding incontinent episodes, playing with daughter, helping around the house, bed mobility, transfers  PERSONAL FACTORS: Past/current experiences, Time since onset of injury/illness/exacerbation, and 3+ comorbidities:   3 thoracic spine surgeries, thoracic MRI notes 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 Gillie on 05/25/2022, adhesive arachnoiditis, nerve pain are also affecting patient's functional outcome.   REHAB POTENTIAL: Fair due to severity and nature of condition.   CLINICAL DECISION MAKING: Evolving/moderate complexity  EVALUATION COMPLEXITY: Moderate   GOALS: Goals reviewed with patient? No  SHORT TERM GOALS: Target date: 01/09/2023. Target date updated to 05/28/2023 for all unmet goals on 03/05/2023.   Patient will be independent with initial home exercise program for self-management of symptoms. Baseline: Initial HEP to be provided at visit 2 as appropriate (12/26/22); Goal status: MET   LONG TERM  GOALS: Target date: 03/20/2023. Target date updated to 08/26/23  for all unmet goals on 06/03/2023.   Patient will be independent with a long-term home exercise program for self-management of symptoms.  Baseline: Initial HEP to be provided at visit 2 as appropriate (12/26/22); participating as able (01/31/2023); patient currently participating as tolerated (03/05/2023; 04/09/2023; 06/03/2023);   Goal status: In-progress  2.  Patient will demonstrate improved FOTO by equal or greater than 10 points by visit #13 to demonstrate improvement in overall condition and self-reported functional ability.  Baseline: to be tested visit 2 as appropriate (12/26/22); 30 at visit #3 (01/02/2023); 46 at visit #13 (03/05/2023); 45 at visit #29 (06/03/2023); Goal status: MET  3.  Patient will demonstrate the ability to ambulate equal or greater than 600 feet with LRAD during the 6 minute walk to improve his household and community mobility.  Baseline: 34 feet with bari-RW (12/26/22); 200 feet with bari-RW (01/31/2023); 500 feet with BRW (03/05/2023); 400 feet with BRW (04/09/2023); 152 feet with BRW and W/C follow for safety (06/03/2023);  Goal status: Ongoing  4.  Patient will complete 5 Time Sit To Stand Test from 19.5 inch surface or lower in equal or less than 15 seconds with no UE support to demonstrate improved transfer ability for improved household and toileting mobility.  Baseline: 23 seconds with heavy B UE support on RW from 19.5 inch plinth. Pain throbbing down the right LE.  (12/26/22); 19 seconds with heavy B UE support on RW from 19.5 inch plinth (03/05/2023); 20 seconds with heavy B UE support on RRW from 18.5 inch plinth. Painful in back of B calves, thighs, and lower back (04/09/2023); 15 seconds with heavy B UE support on BRW from 18.5 inch plinth. Painful in back of B calves, thighs, and lower back (06/03/2023);  Goal status: In-progress  5.  Patient will report worst pain equal or less than 3/10 with  functional activities to improve his ability to complete basic household and community mobility. Baseline: up to 8/10 (12/26/22); reports 3/10 pain (01/31/2023); reports 4/10 pain (02/19/2023); up to 7/10 in the last 2 week s(03/05/2023): up to  7/10 in the last two weeks (04/09/2023); up to 8/10 in the last 2 weeks (06/03/2023);  Goal status: Ongoing   PLAN:  PT FREQUENCY: 1-2x/week  PT DURATION: 12 weeks  PLANNED INTERVENTIONS: Therapeutic  exercises, Therapeutic activity, Neuromuscular re-education, Balance training, Gait training, Patient/Family education, Self Care, Joint mobilization, Stair training, Orthotic/Fit training, DME instructions, Aquatic Therapy, Dry Needling, Electrical stimulation, Wheelchair mobility training, Spinal mobilization, Cryotherapy, Moist heat, Manual therapy, and Re-evaluation.  PLAN FOR NEXT SESSION:  update HEP as appropriate, neurodynamics, gait training, LE/core/functional strengthening, stretching, and balance as tolerated, education, manual therapy as needed.  Camie SAUNDERS. Juli, PT, DPT 08/22/23, 7:35 PM  [facility] [address] [place], [place] [address] P: [phone] I F: [phone]

## 2023-08-22 ENCOUNTER — Encounter: Payer: Self-pay | Admitting: Physical Therapy

## 2023-08-26 ENCOUNTER — Ambulatory Visit: Payer: Medicare Other | Admitting: Physical Therapy

## 2023-08-28 ENCOUNTER — Ambulatory Visit: Payer: Medicare Other | Admitting: Physical Therapy

## 2023-08-28 ENCOUNTER — Encounter: Payer: Self-pay | Admitting: Physical Therapy

## 2023-08-28 DIAGNOSIS — R262 Difficulty in walking, not elsewhere classified: Secondary | ICD-10-CM

## 2023-08-28 DIAGNOSIS — M5416 Radiculopathy, lumbar region: Secondary | ICD-10-CM

## 2023-08-28 DIAGNOSIS — M5459 Other low back pain: Secondary | ICD-10-CM

## 2023-08-28 DIAGNOSIS — R29818 Other symptoms and signs involving the nervous system: Secondary | ICD-10-CM

## 2023-08-28 DIAGNOSIS — R296 Repeated falls: Secondary | ICD-10-CM

## 2023-08-28 DIAGNOSIS — M6281 Muscle weakness (generalized): Secondary | ICD-10-CM

## 2023-08-28 NOTE — Therapy (Addendum)
OUTPATIENT PHYSICAL THERAPY TREATMENT / RE-CERTIFICATION  Patient Name: Travis Fitzpatrick MRN: [id number] DOB:1981-04-29, 43 y.o., male Today's Date: 08/29/23   END OF SESSION:  PT End of Session - 08/28/23 1610     Visit Number 44    Number of Visits 63    Date for PT Re-Evaluation 11/20/23    Authorization Type MEDICARE PART B reporting period from 08/05/2023    Progress Note Due on Visit 50    PT Start Time 1609    PT Stop Time 1642    PT Time Calculation (min) 33 min    Activity Tolerance Patient tolerated treatment well;Patient limited by pain    Behavior During Therapy [facility] for tasks assessed/performed                 Past Medical History:  Diagnosis Date   Bowel trouble    urgency   Medical history non-contributory    Urinary urgency    Past Surgical History:  Procedure Laterality Date   BACK SURGERY  2010   CIRCUMCISION     LUMBAR LAMINECTOMY/DECOMPRESSION MICRODISCECTOMY  07/20/2011   Procedure: LUMBAR LAMINECTOMY/DECOMPRESSION MICRODISCECTOMY;  Surgeon: Carmela Hurt;  Location: [facility];  Service: Neurosurgery;  Laterality: N/A;  right thoracotomy with thoracic eight-nine discectomy and fusion   LUMBAR LAMINECTOMY/DECOMPRESSION MICRODISCECTOMY Right 10/06/2021   Procedure: Right Lumbar Four-Five Microdiscectomy, Right Lumbar Five-Sacal One Foraminotomy;  Surgeon: Coletta Memos, MD;  Location: [facility];  Service: Neurosurgery;  Laterality: Right;  3C/RM 21   THORACIC DISCECTOMY  07/16/2012   Procedure: THORACIC DISCECTOMY;  Surgeon: Carmela Hurt, MD;  Location: [facility];  Service: Neurosurgery;  Laterality: Right;  RIGHT Thoracic seven-eight  thoracic diskectomy via thoracotomy by dr Laneta Simmers   THORACIC DISCECTOMY N/A 12/15/2014   Procedure: THORACIC SEVEN TO THORACIC NINE Laminectomy ;  Surgeon: Coletta Memos, MD;  Location: [facility];  Service: Neurosurgery;  Laterality: N/A;   THORACIC DISCECTOMY N/A 06/06/2017   Procedure: LAMINECTOMY THORACIC  NINE-TEN;  Surgeon: Coletta Memos, MD;  Location: [facility];  Service: Neurosurgery;  Laterality: N/A;  LAMINECTOMY THORACIC NINE-TEN   THORACOTOMY  07/20/2011   Procedure: THORACOTOMY OPEN FOR SPINE SURGERY;  Surgeon: Norton Blizzard, MD;  Location: [facility];  Service: Vascular;  Laterality: N/A;   THORACOTOMY  07/16/2012   Procedure: THORACOTOMY OPEN FOR SPINE SURGERY;  Surgeon: Alleen Borne, MD;  Location: [facility];  Service: Thoracic;  Laterality: N/A;   Patient Active Problem List   Diagnosis Date Noted   Nerve pain 12/05/2022   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   S/P lumbar spinal fusion 05/25/2022   Synovial cyst of lumbar facet joint 05/25/2022   HNP (herniated nucleus pulposus), lumbar 10/06/2021   Hand pain 06/28/2021   Lumbar facet arthropathy 01/11/2021   Thoracic spondylosis with myelopathy 01/11/2021   Thoracic spinal stenosis 06/06/2017   Stenosis, spinal, thoracic 12/15/2014   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 09/22/2014   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/2011    PCP: Nira Retort  REFERRING PROVIDER: Ranelle Oyster, MD  REFERRING DIAG: thoracic disc disease with myelopathy, nerve pain, adhesive arachnoiditis  Rationale for Evaluation and Treatment: Rehabilitation  THERAPY DIAG:  Other low back pain  Other symptoms and signs involving the nervous system  Radiculopathy, lumbar region  Muscle weakness (generalized)  Repeated falls  Difficulty in walking, not elsewhere classified  ONSET DATE:  Suddenly started having weakness 11 years ago, most recent episode of worsening October  2022, s/p right L4-5 laminotomy, microdiscectomy on 10/06/2021, now s/p L4-5 PLIF by Dr. Franky Macho on 05/25/2022.  PERTINENT HISTORY:  Patient is a 43 y.o. male who presents to outpatient physical therapy with a referral for medical diagnosis thoracic disc disease with myelopathy, nerve pain, adhesive arachnoiditis. This  patient's chief complaints consist of disabling low back and R >L leg pain and weakness with activity, bowel and bladder urgency, incontinence, and retention, leading to the following functional deficits: difficulty with or unable to complete any activity that requires weight bearing, use of B LE, and/or balance including working, household and community mobility, walking, driving, going to family gatherings, avoiding incontinent episodes, playing with daughter, helping around the house, bed mobility, transfers. 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 Franky Macho on 05/25/2022, adhesive arachnoiditis, nerve pain.  Patient denies hx of cancer, stroke, seizures, lung problems, heart problems, diabetes, unexplained weight loss, and osteoporosis.  SUBJECTIVE:                                                                                                                                                                                           SUBJECTIVE STATEMENT:     Patient states he is having a lot of pain today. He was unable to get to his PT visit earlier this week because of how the snow lingered on his front porch. He had increased pain after last PT session, but not so bad he could not get up and move around. The pain was in his right groin more than anywhere. He states the LAD during PT sessions really helps control his pain.   PAIN:  NPRS: 3/10 mostly in low back (stabbing/pressure), bilateral posterior thighs, right calf right shin, and right hip/groin,  pain in right calf and right  shin while ambulating. Continues with intermittent stinging in the right posterior hip when he weight bears (comes and goes)  PRECAUTIONS: Fall  PATIENT GOALS: "To get better, walk, drive again, climb stairs, play with my daughter, help around the house again"  NEXT MD VISIT:   OBJECTIVE   TODAY'S TREATMENT:    Manual therapy: to reduce pain and tissue tension, improve range of motion, neuromodulation, in order to promote improved ability to complete functional activities. Hooklying R hip caudal/posterior glide with belt, 4x30-45  seconds, grade IV  Therapeutic exercise: to centralize symptoms and improve ROM, strength, muscular endurance, and activity tolerance required for successful completion of functional activities.  Seated CHEST press with sled bar (8.8#) + 7.5# AW 3x15  Seated deadlift with KB (seated on 22.5 inch surface), 20# KB to 4 inch yoga block.   1x10   Seated rest  1x10 Discontinued due to increasing back pain.  (Attempted with 30#KB but discontinued after 1 rep due to sharp pain)  Seated lumbar flexion roll out with clear theraball, approx 3 min at self selected pace (for pain relief)   Therapeutic activities: dynamic activities for functional strengthening and improved functional activity tolerance.  side stepping along treadmill bar with B UE support 5x5 feet each way seated rest 4x5 feet each way Limited by pain/weakness with continued reps  Ambulation approx 100 feet down ramp from clinic to vehicle with BRW and W/C follow for safety  Pt required multimodal cuing for proper technique and to facilitate improved neuromuscular control, strength, range of motion, and functional ability resulting in improved performance and form.   PATIENT EDUCATION:  Education details: Exercise purpose/form. Self management techniques.  Person educated: patient Education method: Explanation Education comprehension: verbalized understanding and needs further  education  HOME EXERCISE PROGRAM: Verbally:  - seated lumbar flexion stretch with head down and one LE extended using rollator, 1x10 each side.  - curl up with legs elevated, 5x15 seconds - sidelying open book 1x10 each side  Access Code: [email or web address] URL: [email or web address] Date: 06/24/2023 Prepared by: Norton Blizzard  Exercises - Median Nerve Flossing - Tray  - 3 x daily - 10 reps - Seated Cervical Retraction  - 1-2 x daily - 3 sets - 10 reps  ASSESSMENT:  CLINICAL IMPRESSION:     Patient arrives reporting difficulty with pain but less immobility due to pain over the past couple of weeks. Today's session continued to focus on pain control and tolerated exercises for back, LE, and functional strength. Patient continues to be limited by pain but gives good effort. Physical therapy is an important component of this patinet's ability to manage pain and maximize function in the face of a chronic, debilitating, degenerative disease. Long term goals updated to reflect need for continued physical therapy. Patient would benefit from continued management of limiting condition by skilled physical therapist to address remaining impairments and functional limitations to work towards stated goals and return to PLOF or maximal functional independence.   OBJECTIVE IMPAIRMENTS: Abnormal gait, decreased activity tolerance, decreased balance, decreased coordination, decreased endurance, decreased knowledge of condition, decreased knowledge of use of DME, decreased mobility, difficulty walking, decreased ROM, decreased strength, increased edema, impaired perceived functional ability, increased muscle spasms, impaired flexibility, impaired sensation, impaired tone, improper body mechanics, postural dysfunction, obesity, and pain.   ACTIVITY LIMITATIONS: carrying, lifting, bending, standing, squatting, stairs, transfers, bed mobility, continence, bathing, toileting, dressing, hygiene/grooming,  locomotion level, and caring for others  PARTICIPATION LIMITATIONS: meal prep, cleaning, laundry, interpersonal relationship, driving, shopping, community activity, occupation, yard work, and   difficulty with or unable to complete any activity that requires weight bearing, use of B LE, and/or balance including working, household and community mobility, walking, driving, going to family gatherings, avoiding incontinent episodes, playing with daughter, helping around the house, bed mobility, transfers  PERSONAL FACTORS: Past/current experiences, Time since onset of injury/illness/exacerbation, and 3+ comorbidities:   3 thoracic spine surgeries, thoracic MRI notes "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 Franky Macho on 05/25/2022, adhesive arachnoiditis, nerve pain are also affecting patient's functional outcome.   REHAB POTENTIAL: Fair due to severity and nature of condition.   CLINICAL DECISION MAKING: Evolving/moderate complexity  EVALUATION COMPLEXITY: Moderate   GOALS: Goals reviewed with patient? No  SHORT TERM GOALS: Target date: 01/09/2023. Target date updated to 05/28/2023 for all unmet goals on 03/05/2023.   Patient will be independent with initial home exercise program for self-management of symptoms. Baseline: Initial HEP to be provided at visit 2 as appropriate (12/26/22); Goal status: MET   LONG TERM GOALS: Target date: 03/20/2023. Target date updated to 08/26/23  for all unmet goals on 06/03/2023. Target date updated to 11/20/2023 for all  unmet goals on 08/28/2023.   Patient will be independent with a long-term home exercise program for self-management of symptoms.   Baseline: Initial HEP to be provided at visit 2 as appropriate (12/26/22); participating as able (01/31/2023); patient currently participating as tolerated (03/05/2023; 04/09/2023; 06/03/2023);   Goal status: In-progress  2.  Patient will demonstrate improved FOTO by equal or greater than 10 points by visit #13 to demonstrate improvement in overall condition and self-reported functional ability.  Baseline: to be tested visit 2 as appropriate (12/26/22); 30 at visit #3 (01/02/2023); 46 at visit #13 (03/05/2023); 45 at visit #29 (06/03/2023); Goal status: MET  3.  Patient will demonstrate the ability to ambulate equal or greater than 600 feet with LRAD during the 6 minute walk to improve his household and community mobility.  Baseline: 34 feet with bari-RW (12/26/22); 200 feet with bari-RW (01/31/2023); 500 feet with BRW (03/05/2023); 400 feet with BRW (04/09/2023); 152 feet with BRW and W/C follow for safety (06/03/2023);  Goal status: Ongoing  4.  Patient will complete 5 Time Sit To Stand Test from 19.5 inch surface or lower in equal or less than 15 seconds with no UE support to demonstrate improved transfer ability for improved household and toileting mobility.  Baseline: 23 seconds with heavy B UE support on RW from 19.5 inch plinth. Pain throbbing down the right LE.  (12/26/22); 19 seconds with heavy B UE support on RW from 19.5 inch plinth (03/05/2023); 20 seconds with heavy B UE support on RRW from 18.5 inch plinth. Painful in back of B calves, thighs, and lower back (04/09/2023); 15 seconds with heavy B UE support on BRW from 18.5 inch plinth. Painful in back of B calves, thighs, and lower back (06/03/2023);  Goal status: In-progress  5.  Patient will report worst pain equal or less than 3/10 with functional activities to improve his ability to complete basic household and community mobility. Baseline: up to 8/10 (12/26/22); reports 3/10 pain (01/31/2023); reports 4/10 pain (02/19/2023); up to 7/10 in the  last 2 week s(03/05/2023): up to  7/10 in the last two weeks (04/09/2023); up to 8/10 in the last 2 weeks (06/03/2023);  Goal status: Ongoing   PLAN:  PT FREQUENCY: 1-2x/week  PT DURATION: 12 weeks  PLANNED INTERVENTIONS: Therapeutic exercises, Therapeutic activity, Neuromuscular re-education, Balance training, Gait training, Patient/Family education, Self Care, Joint mobilization, Stair training, Orthotic/Fit training, DME instructions, Aquatic Therapy, Dry Needling, Electrical stimulation, Wheelchair mobility training, Spinal mobilization, Cryotherapy, Moist heat, Manual therapy, and Re-evaluation.  PLAN FOR NEXT SESSION:  update HEP as appropriate, neurodynamics,  gait training, LE/core/functional strengthening, stretching, and balance as tolerated, education, manual therapy as needed.  Luretha Murphy. Ilsa Iha, PT, DPT 08/29/23, 10:21 PM  [facility] [address] [place], [place] [address] P: [phone] I F: [phone]

## 2023-09-02 ENCOUNTER — Ambulatory Visit: Payer: Medicare Other | Admitting: Physical Therapy

## 2023-09-02 ENCOUNTER — Encounter: Payer: Self-pay | Admitting: Physical Therapy

## 2023-09-02 DIAGNOSIS — R262 Difficulty in walking, not elsewhere classified: Secondary | ICD-10-CM

## 2023-09-02 DIAGNOSIS — R29818 Other symptoms and signs involving the nervous system: Secondary | ICD-10-CM

## 2023-09-02 DIAGNOSIS — M5459 Other low back pain: Secondary | ICD-10-CM | POA: Diagnosis not present

## 2023-09-02 DIAGNOSIS — M5416 Radiculopathy, lumbar region: Secondary | ICD-10-CM

## 2023-09-02 DIAGNOSIS — R296 Repeated falls: Secondary | ICD-10-CM

## 2023-09-02 DIAGNOSIS — M6281 Muscle weakness (generalized): Secondary | ICD-10-CM

## 2023-09-02 NOTE — Therapy (Signed)
OUTPATIENT PHYSICAL THERAPY TREATMENT   Patient Name: VINAL NOSBISCH MRN: [id number] DOB:01-17-1981, 43 y.o., male Today's Date: 09/02/23   END OF SESSION:  PT End of Session - 09/02/23 1642     Visit Number 45    Number of Visits 63    Date for PT Re-Evaluation 11/20/23    Authorization Type MEDICARE PART B reporting period from 08/05/2023    Progress Note Due on Visit 50    PT Start Time 1608    PT Stop Time 1646    PT Time Calculation (min) 38 min    Activity Tolerance Patient tolerated treatment well;Patient limited by pain    Behavior During Therapy [facility] for tasks assessed/performed              Past Medical History:  Diagnosis Date   Bowel trouble    urgency   Medical history non-contributory    Urinary urgency    Past Surgical History:  Procedure Laterality Date   BACK SURGERY  2010   CIRCUMCISION     LUMBAR LAMINECTOMY/DECOMPRESSION MICRODISCECTOMY  07/20/2011   Procedure: LUMBAR LAMINECTOMY/DECOMPRESSION MICRODISCECTOMY;  Surgeon: Carmela Hurt;  Location: [facility];  Service: Neurosurgery;  Laterality: N/A;  right thoracotomy with thoracic eight-nine discectomy and fusion   LUMBAR LAMINECTOMY/DECOMPRESSION MICRODISCECTOMY Right 10/06/2021   Procedure: Right Lumbar Four-Five Microdiscectomy, Right Lumbar Five-Sacal One Foraminotomy;  Surgeon: Coletta Memos, MD;  Location: [facility];  Service: Neurosurgery;  Laterality: Right;  3C/RM 21   THORACIC DISCECTOMY  07/16/2012   Procedure: THORACIC DISCECTOMY;  Surgeon: Carmela Hurt, MD;  Location: [facility];  Service: Neurosurgery;  Laterality: Right;  RIGHT Thoracic seven-eight  thoracic diskectomy via thoracotomy by dr Laneta Simmers   THORACIC DISCECTOMY N/A 12/15/2014   Procedure: THORACIC SEVEN TO THORACIC NINE Laminectomy ;  Surgeon: Coletta Memos, MD;  Location: [facility];  Service: Neurosurgery;  Laterality: N/A;   THORACIC DISCECTOMY N/A 06/06/2017   Procedure: LAMINECTOMY THORACIC NINE-TEN;  Surgeon: Coletta Memos, MD;  Location: [facility];  Service: Neurosurgery;  Laterality: N/A;  LAMINECTOMY THORACIC NINE-TEN   THORACOTOMY  07/20/2011   Procedure: THORACOTOMY OPEN FOR SPINE SURGERY;  Surgeon: Norton Blizzard, MD;  Location: [facility];  Service: Vascular;  Laterality: N/A;   THORACOTOMY  07/16/2012   Procedure: THORACOTOMY OPEN FOR SPINE SURGERY;  Surgeon: Alleen Borne, MD;  Location: [facility];  Service: Thoracic;  Laterality: N/A;   Patient Active Problem List   Diagnosis Date Noted   Nerve pain 12/05/2022   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   S/P lumbar spinal fusion 05/25/2022   Synovial cyst of lumbar facet joint 05/25/2022   HNP (herniated nucleus pulposus), lumbar 10/06/2021   Hand pain 06/28/2021   Lumbar facet arthropathy 01/11/2021   Thoracic spondylosis with myelopathy 01/11/2021   Thoracic spinal stenosis 06/06/2017   Stenosis, spinal, thoracic 12/15/2014   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 09/22/2014   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/2011    PCP: Nira Retort  REFERRING PROVIDER: Ranelle Oyster, MD  REFERRING DIAG: thoracic disc disease with myelopathy, nerve pain, adhesive arachnoiditis  Rationale for Evaluation and Treatment: Rehabilitation  THERAPY DIAG:  Other low back pain  Other symptoms and signs involving the nervous system  Radiculopathy, lumbar region  Muscle weakness (generalized)  Repeated falls  Difficulty in walking, not elsewhere classified  ONSET DATE:  Suddenly started having weakness 11 years ago, most recent episode of worsening October 2022, s/p right L4-5  laminotomy, microdiscectomy on 10/06/2021, now s/p L4-5 PLIF by Dr. Franky Macho on 05/25/2022.  PERTINENT HISTORY:  Patient is a 43 y.o. male who presents to outpatient physical therapy with a referral for medical diagnosis thoracic disc disease with myelopathy, nerve pain, adhesive arachnoiditis. This patient's chief complaints consist of  disabling low back and R >L leg pain and weakness with activity, bowel and bladder urgency, incontinence, and retention, leading to the following functional deficits: difficulty with or unable to complete any activity that requires weight bearing, use of B LE, and/or balance including working, household and community mobility, walking, driving, going to family gatherings, avoiding incontinent episodes, playing with daughter, helping around the house, bed mobility, transfers. 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 Franky Macho on 05/25/2022, adhesive arachnoiditis, nerve pain.  Patient denies hx of cancer, stroke, seizures, lung problems, heart problems, diabetes, unexplained weight loss, and osteoporosis.  SUBJECTIVE:                                                                                                                                                                                           SUBJECTIVE STATEMENT:     Patient states he is hurting today. He states after last PT session the pain went up but it did not put him down for a couple of days. He had a near fall but not an actual fall since last PT session.   PAIN:  NPRS: 5/10 mostly in low back and  back of right leg   PRECAUTIONS: Fall  PATIENT GOALS: "To get better, walk, drive again, climb stairs, play with my daughter, help around the house again"  NEXT MD VISIT:   OBJECTIVE   TODAY'S TREATMENT:    Manual therapy: to reduce pain and tissue tension, improve range of motion, neuromodulation, in order to promote  improved ability to complete functional activities. Hooklying R hip caudal/posterior glide with belt, 4x30-45 seconds, grade IV  Therapeutic exercise: to centralize symptoms and improve ROM, strength, muscular endurance, and activity tolerance required for successful completion of functional activities.   Seated lumbar flexion roll out with clear theraball, approx 3 min at self selected pace (no improvement in pain)  Superset/Circuit:  seated overhead pull down (B shoulder extension from ~120  degrees flexion to bar on thighs) with  cable bar  3x20 at 15#  Seated CHEST press with  3x15 with sled bar (8.8#) + 7.5# AW  Seated lumbar flexion roll out with clear theraball, self selected pace and time per patient preference to try to calm pain.  2x patient preference.   Seated lumbar flexion roll out with clear theraball, approx 3 min at self selected pace (for pain relief)   Therapeutic activities: dynamic activities for functional strengthening and improved functional activity tolerance.  side stepping along treadmill bar with B UE support 4x5 feet each way seated rest 5x5 feet each way  Ambulation approx 100 feet down ramp from clinic to vehicle with BRW and W/C follow for safety  Pt required multimodal cuing for proper technique and to facilitate improved neuromuscular control, strength, range of motion, and functional ability resulting in improved performance and form.   PATIENT EDUCATION:  Education details: Exercise purpose/form. Self management techniques.  Person educated: patient Education method: Explanation Education comprehension: verbalized understanding and needs further education  HOME EXERCISE PROGRAM: Verbally:  - seated lumbar flexion stretch with head down and one LE extended using rollator, 1x10 each side.  - curl up with legs elevated, 5x15 seconds - sidelying open book 1x10 each side  Access Code: [email or web address] URL: [email or web address] Date:  06/24/2023 Prepared by: Norton Blizzard  Exercises - Median Nerve Flossing - Tray  - 3 x daily - 10 reps - Seated Cervical Retraction  - 1-2 x daily - 3 sets - 10 reps  ASSESSMENT:  CLINICAL IMPRESSION:     Patient arrives with increased pain and decreased tolerance for PT interventions. He continues to be highly motivated to participate and gave excellent effort despite limitations. Today's session focused on more seated exercises to accommodate pain. Plan to continue exercises for core/LE/functional strengthening and balance as tolerated. Patient would benefit from continued management of limiting condition by skilled physical therapist to address remaining impairments and functional limitations to work towards stated goals and return to PLOF or maximal functional independence.  OBJECTIVE IMPAIRMENTS: Abnormal gait, decreased activity tolerance, decreased balance, decreased coordination, decreased endurance, decreased knowledge of condition, decreased knowledge of use of DME, decreased mobility, difficulty walking, decreased ROM, decreased strength, increased edema, impaired perceived functional ability, increased muscle spasms, impaired flexibility, impaired sensation, impaired tone, improper body mechanics, postural dysfunction, obesity, and pain.   ACTIVITY LIMITATIONS: carrying, lifting, bending, standing, squatting, stairs, transfers, bed mobility, continence, bathing, toileting, dressing, hygiene/grooming, locomotion level, and caring for others  PARTICIPATION LIMITATIONS: meal prep, cleaning, laundry, interpersonal relationship, driving, shopping, community activity, occupation, yard work, and   difficulty with or unable to complete any activity that requires weight bearing, use of B LE, and/or balance including working, household and community mobility, walking, driving, going to family gatherings, avoiding incontinent episodes, playing with daughter, helping around the house, bed mobility,  transfers  PERSONAL FACTORS: Past/current experiences, Time since onset of injury/illness/exacerbation, and 3+ comorbidities:   3 thoracic spine surgeries, thoracic MRI notes "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 Franky Macho on 05/25/2022, adhesive arachnoiditis, nerve pain are also affecting patient's functional outcome.   REHAB POTENTIAL: Fair due to severity and nature of condition.   CLINICAL DECISION MAKING: Evolving/moderate complexity  EVALUATION COMPLEXITY: Moderate   GOALS:  Goals reviewed with patient? No  SHORT TERM GOALS: Target date: 01/09/2023. Target date updated to 05/28/2023 for all unmet goals on 03/05/2023.   Patient will be independent with initial home exercise program for self-management of symptoms. Baseline: Initial HEP to be provided at visit 2 as appropriate (12/26/22); Goal status: MET   LONG TERM GOALS: Target date: 03/20/2023. Target date updated to 08/26/23  for all unmet goals on 06/03/2023. Target date updated to 11/20/2023 for all  unmet goals on 08/28/2023.   Patient will be independent with a long-term home exercise program for self-management of symptoms.  Baseline: Initial HEP to be provided at visit 2 as appropriate (12/26/22); participating as able (01/31/2023); patient currently participating as tolerated (03/05/2023; 04/09/2023; 06/03/2023);   Goal status: In-progress  2.  Patient will demonstrate improved FOTO by equal or greater than 10 points by visit #13 to demonstrate improvement in overall condition and self-reported functional ability.  Baseline: to be tested visit 2 as appropriate (12/26/22); 30 at visit #3 (01/02/2023); 46 at visit  #13 (03/05/2023); 45 at visit #29 (06/03/2023); Goal status: MET  3.  Patient will demonstrate the ability to ambulate equal or greater than 600 feet with LRAD during the 6 minute walk to improve his household and community mobility.  Baseline: 34 feet with bari-RW (12/26/22); 200 feet with bari-RW (01/31/2023); 500 feet with BRW (03/05/2023); 400 feet with BRW (04/09/2023); 152 feet with BRW and W/C follow for safety (06/03/2023);  Goal status: Ongoing  4.  Patient will complete 5 Time Sit To Stand Test from 19.5 inch surface or lower in equal or less than 15 seconds with no UE support to demonstrate improved transfer ability for improved household and toileting mobility.  Baseline: 23 seconds with heavy B UE support on RW from 19.5 inch plinth. Pain throbbing down the right LE.  (12/26/22); 19 seconds with heavy B UE support on RW from 19.5 inch plinth (03/05/2023); 20 seconds with heavy B UE support on RRW from 18.5 inch plinth. Painful in back of B calves, thighs, and lower back (04/09/2023); 15 seconds with heavy B UE support on BRW from 18.5 inch plinth. Painful in back of B calves, thighs, and lower back (06/03/2023);  Goal status: In-progress  5.  Patient will report worst pain equal or less than 3/10 with functional activities to improve his ability to complete basic household and community mobility. Baseline: up to 8/10 (12/26/22); reports 3/10 pain (01/31/2023); reports 4/10 pain (02/19/2023); up to 7/10 in the last 2 week s(03/05/2023): up to  7/10 in the last two weeks (04/09/2023); up to 8/10 in the last 2 weeks (06/03/2023);  Goal status: Ongoing   PLAN:  PT FREQUENCY: 1-2x/week  PT DURATION: 12 weeks  PLANNED INTERVENTIONS: Therapeutic exercises, Therapeutic activity, Neuromuscular re-education, Balance training, Gait training, Patient/Family education, Self Care, Joint mobilization, Stair training, Orthotic/Fit training, DME instructions, Aquatic Therapy, Dry Needling, Electrical  stimulation, Wheelchair mobility training, Spinal mobilization, Cryotherapy, Moist heat, Manual therapy, and Re-evaluation.  PLAN FOR NEXT SESSION:  update HEP as appropriate, neurodynamics, gait training, LE/core/functional strengthening, stretching, and balance as tolerated, education, manual therapy as needed.  Luretha Murphy. Ilsa Iha, PT, DPT 09/02/23, 6:33 PM  [facility] [address] [place], [place] [address] P: [phone] I F: [phone]

## 2023-09-04 ENCOUNTER — Ambulatory Visit: Payer: Medicare Other | Admitting: Physical Therapy

## 2023-09-09 ENCOUNTER — Encounter: Payer: Self-pay | Admitting: Physical Therapy

## 2023-09-09 ENCOUNTER — Ambulatory Visit: Payer: Medicare Other | Admitting: Physical Therapy

## 2023-09-09 DIAGNOSIS — M5459 Other low back pain: Secondary | ICD-10-CM | POA: Diagnosis not present

## 2023-09-09 DIAGNOSIS — R29818 Other symptoms and signs involving the nervous system: Secondary | ICD-10-CM

## 2023-09-09 DIAGNOSIS — R296 Repeated falls: Secondary | ICD-10-CM

## 2023-09-09 DIAGNOSIS — M5416 Radiculopathy, lumbar region: Secondary | ICD-10-CM

## 2023-09-09 DIAGNOSIS — M6281 Muscle weakness (generalized): Secondary | ICD-10-CM

## 2023-09-09 NOTE — Therapy (Unsigned)
OUTPATIENT PHYSICAL THERAPY TREATMENT   Patient Name: Travis Fitzpatrick MRN: [id number] DOB:[birth year], 43 y.o., male Today's Date: 09/09/23   END OF SESSION:  PT End of Session - 09/09/23 1714     Visit Number 46    Number of Visits 63    Date for PT Re-Evaluation 11/20/23    Authorization Type MEDICARE PART B reporting period from 08/05/2023    Progress Note Due on Visit 50    PT Start Time 1606    PT Stop Time 1648    PT Time Calculation (min) 42 min    Activity Tolerance Patient tolerated treatment well;Patient limited by pain    Behavior During Therapy [facility] for tasks assessed/performed               Past Medical History:  Diagnosis Date   Bowel trouble    urgency   Medical history non-contributory    Urinary urgency    Past Surgical History:  Procedure Laterality Date   BACK SURGERY  2010   CIRCUMCISION     LUMBAR LAMINECTOMY/DECOMPRESSION MICRODISCECTOMY  07/20/2011   Procedure: LUMBAR LAMINECTOMY/DECOMPRESSION MICRODISCECTOMY;  Surgeon: Carmela Hurt;  Location: [facility];  Service: Neurosurgery;  Laterality: N/A;  right thoracotomy with thoracic eight-nine discectomy and fusion   LUMBAR LAMINECTOMY/DECOMPRESSION MICRODISCECTOMY Right 10/06/2021   Procedure: Right Lumbar Four-Five Microdiscectomy, Right Lumbar Five-Sacal One Foraminotomy;  Surgeon: Coletta Memos, MD;  Location: [facility];  Service: Neurosurgery;  Laterality: Right;  3C/RM 21   THORACIC DISCECTOMY  07/16/2012   Procedure: THORACIC DISCECTOMY;  Surgeon: Carmela Hurt, MD;  Location: [facility];  Service: Neurosurgery;  Laterality: Right;  RIGHT Thoracic seven-eight  thoracic diskectomy via thoracotomy by dr Laneta Simmers   THORACIC DISCECTOMY N/A 12/15/2014   Procedure: THORACIC SEVEN TO THORACIC NINE Laminectomy ;  Surgeon: Coletta Memos, MD;  Location: [facility];  Service: Neurosurgery;  Laterality: N/A;   THORACIC DISCECTOMY N/A 06/06/2017   Procedure: LAMINECTOMY THORACIC NINE-TEN;  Surgeon: Coletta Memos, MD;  Location: [facility];  Service: Neurosurgery;  Laterality: N/A;  LAMINECTOMY THORACIC NINE-TEN   THORACOTOMY  07/20/2011   Procedure: THORACOTOMY OPEN FOR SPINE SURGERY;  Surgeon: Norton Blizzard, MD;  Location: [facility];  Service: Vascular;  Laterality: N/A;   THORACOTOMY  07/16/2012   Procedure: THORACOTOMY OPEN FOR SPINE SURGERY;  Surgeon: Alleen Borne, MD;  Location: [facility];  Service: Thoracic;  Laterality: N/A;   Patient Active Problem List   Diagnosis Date Noted   Nerve pain 12/05/2022   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   S/P lumbar spinal fusion 05/25/2022   Synovial cyst of lumbar facet joint 05/25/2022   HNP (herniated nucleus pulposus), lumbar 10/06/2021   Hand pain 06/28/2021   Lumbar facet arthropathy 01/11/2021   Thoracic spondylosis with myelopathy 01/11/2021   Thoracic spinal stenosis 06/06/2017   Stenosis, spinal, thoracic 12/15/2014   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 09/22/2014   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/2011    PCP: Nira Retort  REFERRING PROVIDER: Ranelle Oyster, MD  REFERRING DIAG: thoracic disc disease with myelopathy, nerve pain, adhesive arachnoiditis  Rationale for Evaluation and Treatment: Rehabilitation  THERAPY DIAG:  Other low back pain  Other symptoms and signs involving the nervous system  Radiculopathy, lumbar region  Muscle weakness (generalized)  Repeated falls  ONSET DATE:  Suddenly started having weakness 11 years ago, most recent episode of worsening October 2022, s/p right L4-5 laminotomy, microdiscectomy on 10/06/2021, now s/p  L4-5 PLIF by Dr. Franky Macho on 05/25/2022.  PERTINENT HISTORY:  Patient is a 43 y.o. male who presents to outpatient physical therapy with a referral for medical diagnosis thoracic disc disease with myelopathy, nerve pain, adhesive arachnoiditis. This patient's chief complaints consist of disabling low back and R >L leg pain and weakness  with activity, bowel and bladder urgency, incontinence, and retention, leading to the following functional deficits: difficulty with or unable to complete any activity that requires weight bearing, use of B LE, and/or balance including working, household and community mobility, walking, driving, going to family gatherings, avoiding incontinent episodes, playing with daughter, helping around the house, bed mobility, transfers. 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 Franky Macho on 05/25/2022, adhesive arachnoiditis, nerve pain.  Patient denies hx of cancer, stroke, seizures, lung problems, heart problems, diabetes, unexplained weight loss, and osteoporosis.  SUBJECTIVE:                                                                                                                                                                                           SUBJECTIVE STATEMENT:     Patient arrives to session feeling better than last week. He states he was experiencing increased pain after leaving last session that lasted 3-4 days that resulted in having to stay in bed for a few days. Patient reports current 5/10 pain in right groin, hip, and back. Patient states he had one near fall about 2 hours after leaving last session. He reports putting pressure on his right leg and it gave out but his wife was able to grab his pants to keep him from falling.    PAIN:  NPRS: 5/10 mostly in low back, right hip, and right groin   PRECAUTIONS: Fall  PATIENT GOALS: "To get better, walk, drive again, climb  stairs, play with my daughter, help around the house again"  NEXT MD VISIT:   OBJECTIVE   TODAY'S TREATMENT:    Manual therapy: to reduce pain and tissue tension, improve range of motion, neuromodulation, in order to promote improved ability to complete functional activities. Hooklying R hip caudal/posterior glide with belt, 4x45 seconds, grade IV  Therapeutic exercise: to centralize symptoms and improve ROM, strength, muscular endurance, and activity tolerance required for successful completion of functional activities.  Superset/Circuit:  Seated overhead pull down (B shoulder extension from ~120  degrees flexion to bar on thighs) with cable bar  1x20 at 20#  2x20 at 25# Seated CHEST press with  3x15 with sled bar (8.8#) + 7.5# AW  Seated lumbar flexion roll out with clear theraball, self selected pace and time per patient preference to calm pain.  2x patient preference.   Seated lumbar flexion roll out with clear theraball, approx 3 min at self selected pace (for pain relief)   Blaze Pod Focus Setting with 6 pods, seated position (tapping pods with feet)  1x2 min (55 hits)   1x2 min (58 hits)    Pt required multimodal cuing for proper technique and to facilitate improved neuromuscular control, strength, range of motion, and functional ability resulting in improved performance and form.  Therex: 32 min Theract: 0 min Neuro: 0 min Manual: 10 min  PATIENT EDUCATION:  Education details: Exercise purpose/form. Self management techniques.  Person educated: patient Education method: Explanation Education comprehension: verbalized understanding and needs further education  HOME EXERCISE PROGRAM: Verbally:  - seated lumbar flexion stretch with head down and one LE extended using rollator, 1x10 each side.  - curl up with legs elevated, 5x15 seconds - sidelying open book 1x10 each side  Access Code: [email or web address] URL: [email or web address] Date: 06/24/2023 Prepared  by: Norton Blizzard  Exercises - Median Nerve Flossing - Tray  - 3 x daily - 10 reps - Seated Cervical Retraction  - 1-2 x daily - 3 sets - 10 reps  ASSESSMENT:  CLINICAL IMPRESSION:     Patient arrives to session with improved pain since last PT session and better tolerance to therapeutic interventions. Patient continues to participate in exercises as tolerated with good motivation. The focus of today's session was to continue better tolerated seated exercises to improve LE/UE/core strengthening to improve ability to complete functional activities. Patient tolerated exercises well and was able to calm pain with periodic seated lumbar flexion roll outs with the theraball. The [employer] Focus setting was selected to refine precision and concentration, isolating lower extremity/hip flexor muscle groups to enhance overall coordination and targeted muscle engagement. Patient reports no change in pain or discomfort at the end of session. Patient would benefit from continued management of limiting condition by skilled physical therapist to address remaining impairments and functional limitations to work towards stated goals and return to PLOF or maximal functional independence.    OBJECTIVE IMPAIRMENTS: Abnormal gait, decreased activity tolerance, decreased balance, decreased coordination, decreased endurance, decreased knowledge of condition, decreased knowledge of use of DME, decreased mobility, difficulty walking, decreased ROM, decreased strength, increased edema, impaired perceived functional ability, increased muscle spasms, impaired flexibility, impaired sensation, impaired tone, improper body mechanics, postural dysfunction, obesity, and pain.   ACTIVITY LIMITATIONS: carrying, lifting, bending, standing, squatting, stairs, transfers, bed mobility, continence, bathing, toileting, dressing, hygiene/grooming, locomotion level, and caring for others  PARTICIPATION LIMITATIONS: meal prep, cleaning, laundry,  interpersonal relationship, driving, shopping, community activity, occupation, yard work, and   difficulty with or unable to complete any activity that requires weight bearing, use of B LE, and/or balance including working, household and community mobility, walking, driving, going to family gatherings, avoiding incontinent episodes, playing with daughter, helping around the house, bed mobility, transfers  PERSONAL FACTORS: Past/current experiences, Time since onset of injury/illness/exacerbation, and 3+ comorbidities:   3 thoracic spine surgeries, thoracic MRI notes "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 Franky Macho on 05/25/2022, adhesive arachnoiditis, nerve pain are also affecting patient's functional outcome.   REHAB POTENTIAL: Fair due to severity and nature of condition.   CLINICAL DECISION MAKING: Evolving/moderate complexity  EVALUATION COMPLEXITY: Moderate   GOALS: Goals reviewed with patient? No  SHORT TERM GOALS: Target date: 01/09/2023. Target date updated to 05/28/2023 for all unmet goals on 03/05/2023.   Patient will be independent with initial home exercise program for self-management of symptoms. Baseline: Initial HEP to be provided at visit 2 as appropriate (12/26/22); Goal status: MET   LONG TERM GOALS: Target date: 03/20/2023. Target date updated to 08/26/23  for all unmet goals on 06/03/2023. Target date updated to 11/20/2023 for all  unmet goals on 08/28/2023.   Patient will be independent with a long-term home exercise program for self-management of symptoms.  Baseline: Initial HEP to be provided at visit 2 as appropriate (12/26/22); participating as able  (01/31/2023); patient currently participating as tolerated (03/05/2023; 04/09/2023; 06/03/2023);   Goal status: In-progress  2.  Patient will demonstrate improved FOTO by equal or greater than 10 points by visit #13 to demonstrate improvement in overall condition and self-reported functional ability.  Baseline: to be tested visit 2 as appropriate (12/26/22); 30 at visit #3 (01/02/2023); 46 at visit #13 (03/05/2023); 45 at visit #29 (06/03/2023); Goal status: MET  3.  Patient will demonstrate the ability to ambulate equal or greater than 600 feet with LRAD during the 6 minute walk to improve his household and community mobility.  Baseline: 34 feet with bari-RW (12/26/22); 200 feet with bari-RW (01/31/2023); 500 feet with BRW (03/05/2023); 400 feet with BRW (04/09/2023); 152 feet with BRW and W/C follow for safety (06/03/2023);  Goal status: Ongoing  4.  Patient will complete 5 Time Sit To Stand Test from 19.5 inch surface or lower in equal or less than 15 seconds with no UE support to demonstrate improved transfer ability for improved household and toileting mobility.  Baseline: 23 seconds with heavy B UE support on RW from 19.5 inch plinth. Pain throbbing down the right LE.  (12/26/22); 19 seconds with heavy B UE support on RW from 19.5 inch plinth (03/05/2023); 20 seconds with heavy B UE support on RRW from 18.5 inch plinth. Painful in back of B calves, thighs, and lower back (04/09/2023); 15 seconds with heavy B UE support on BRW from 18.5 inch plinth. Painful in back of B calves, thighs, and lower back (06/03/2023);  Goal status: In-progress  5.  Patient will report worst pain equal or less than 3/10 with functional activities to improve his ability to complete basic household and community mobility. Baseline: up to 8/10 (12/26/22); reports 3/10 pain (01/31/2023); reports 4/10 pain (02/19/2023); up to 7/10 in the last 2 week s(03/05/2023): up to  7/10 in the last two weeks (04/09/2023); up to 8/10 in the last 2  weeks (06/03/2023);  Goal status: Ongoing   PLAN:  PT FREQUENCY: 1-2x/week  PT DURATION: 12 weeks  PLANNED INTERVENTIONS: Therapeutic exercises, Therapeutic activity, Neuromuscular re-education, Balance training, Gait training, Patient/Family education, Self Care, Joint mobilization, Stair training, Orthotic/Fit training, DME instructions, Aquatic Therapy, Dry Needling, Electrical stimulation, Wheelchair mobility training, Spinal mobilization, Cryotherapy, Moist heat, Manual therapy, and Re-evaluation.  PLAN FOR NEXT SESSION:  update HEP as appropriate, neurodynamics, gait training, LE/core/functional strengthening,  stretching, and balance as tolerated, education, manual therapy as needed.   Kendel Bessey [place], SPT [employer] DPTE   Huntley Dec R. Ilsa Iha, PT, DPT 09/09/23, 5:25 PM  [facility] [address] [place], [place] [address] P: [phone] I F: [phone]

## 2023-09-10 ENCOUNTER — Other Ambulatory Visit: Payer: Self-pay | Admitting: Physical Medicine & Rehabilitation

## 2023-09-10 DIAGNOSIS — G039 Meningitis, unspecified: Secondary | ICD-10-CM

## 2023-09-10 DIAGNOSIS — M792 Neuralgia and neuritis, unspecified: Secondary | ICD-10-CM

## 2023-09-10 DIAGNOSIS — M5104 Intervertebral disc disorders with myelopathy, thoracic region: Secondary | ICD-10-CM

## 2023-09-11 ENCOUNTER — Ambulatory Visit: Payer: Medicare Other | Admitting: Physical Therapy

## 2023-09-11 ENCOUNTER — Encounter: Payer: Self-pay | Admitting: Physical Therapy

## 2023-09-11 DIAGNOSIS — R29818 Other symptoms and signs involving the nervous system: Secondary | ICD-10-CM

## 2023-09-11 DIAGNOSIS — M5459 Other low back pain: Secondary | ICD-10-CM | POA: Diagnosis not present

## 2023-09-11 DIAGNOSIS — M5416 Radiculopathy, lumbar region: Secondary | ICD-10-CM

## 2023-09-11 DIAGNOSIS — R296 Repeated falls: Secondary | ICD-10-CM

## 2023-09-11 DIAGNOSIS — M6281 Muscle weakness (generalized): Secondary | ICD-10-CM

## 2023-09-11 NOTE — Therapy (Unsigned)
OUTPATIENT PHYSICAL THERAPY TREATMENT   Patient Name: Travis Fitzpatrick MRN: [id number] DOB:1980/09/12, 43 y.o., male Today's Date: 09/12/23   END OF SESSION:  PT End of Session - 09/11/23 1951     Visit Number 47    Number of Visits 63    Date for PT Re-Evaluation 11/20/23    Authorization Type MEDICARE PART B reporting period from 08/05/2023    Progress Note Due on Visit 50    PT Start Time 1602    PT Stop Time 1645    PT Time Calculation (min) 43 min    Activity Tolerance Patient tolerated treatment well;Patient limited by pain    Behavior During Therapy [facility] for tasks assessed/performed                Past Medical History:  Diagnosis Date   Bowel trouble    urgency   Medical history non-contributory    Urinary urgency    Past Surgical History:  Procedure Laterality Date   BACK SURGERY  2010   CIRCUMCISION     LUMBAR LAMINECTOMY/DECOMPRESSION MICRODISCECTOMY  07/20/2011   Procedure: LUMBAR LAMINECTOMY/DECOMPRESSION MICRODISCECTOMY;  Surgeon: Carmela Hurt;  Location: [facility];  Service: Neurosurgery;  Laterality: N/A;  right thoracotomy with thoracic eight-nine discectomy and fusion   LUMBAR LAMINECTOMY/DECOMPRESSION MICRODISCECTOMY Right 10/06/2021   Procedure: Right Lumbar Four-Five Microdiscectomy, Right Lumbar Five-Sacal One Foraminotomy;  Surgeon: Coletta Memos, MD;  Location: [facility];  Service: Neurosurgery;  Laterality: Right;  3C/RM 21   THORACIC DISCECTOMY  07/16/2012   Procedure: THORACIC DISCECTOMY;  Surgeon: Carmela Hurt, MD;  Location: [facility];  Service: Neurosurgery;  Laterality: Right;  RIGHT Thoracic seven-eight  thoracic diskectomy via thoracotomy by dr Laneta Simmers   THORACIC DISCECTOMY N/A 12/15/2014   Procedure: THORACIC SEVEN TO THORACIC NINE Laminectomy ;  Surgeon: Coletta Memos, MD;  Location: [facility];  Service: Neurosurgery;  Laterality: N/A;   THORACIC DISCECTOMY N/A 06/06/2017   Procedure: LAMINECTOMY THORACIC NINE-TEN;  Surgeon:  Coletta Memos, MD;  Location: [facility] OR;  Service: Neurosurgery;  Laterality: N/A;  LAMINECTOMY THORACIC NINE-TEN   THORACOTOMY  07/20/2011   Procedure: THORACOTOMY OPEN FOR SPINE SURGERY;  Surgeon: Norton Blizzard, MD;  Location: [facility];  Service: Vascular;  Laterality: N/A;   THORACOTOMY  07/16/2012   Procedure: THORACOTOMY OPEN FOR SPINE SURGERY;  Surgeon: Alleen Borne, MD;  Location: [facility];  Service: Thoracic;  Laterality: N/A;   Patient Active Problem List   Diagnosis Date Noted   Nerve pain 12/05/2022   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   S/P lumbar spinal fusion 05/25/2022   Synovial cyst of lumbar facet joint 05/25/2022   HNP (herniated nucleus pulposus), lumbar 10/06/2021   Hand pain 06/28/2021   Lumbar facet arthropathy 01/11/2021   Thoracic spondylosis with myelopathy 01/11/2021   Thoracic spinal stenosis 06/06/2017   Stenosis, spinal, thoracic 12/15/2014   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 09/22/2014   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/2011    PCP: Nira Retort  REFERRING PROVIDER: Ranelle Oyster, MD  REFERRING DIAG: thoracic disc disease with myelopathy, nerve pain, adhesive arachnoiditis  Rationale for Evaluation and Treatment: Rehabilitation  THERAPY DIAG:  Other low back pain  Other symptoms and signs involving the nervous system  Radiculopathy, lumbar region  Muscle weakness (generalized)  Repeated falls  ONSET DATE:  Suddenly started having weakness 11 years ago, most recent episode of worsening October 2022, s/p right L4-5 laminotomy, microdiscectomy on 10/06/2021, now  s/p L4-5 PLIF by Dr. Franky Macho on 05/25/2022.  PERTINENT HISTORY:  Patient is a 43 y.o. male who presents to outpatient physical therapy with a referral for medical diagnosis thoracic disc disease with myelopathy, nerve pain, adhesive arachnoiditis. This patient's chief complaints consist of disabling low back and R >L leg pain and  weakness with activity, bowel and bladder urgency, incontinence, and retention, leading to the following functional deficits: difficulty with or unable to complete any activity that requires weight bearing, use of B LE, and/or balance including working, household and community mobility, walking, driving, going to family gatherings, avoiding incontinent episodes, playing with daughter, helping around the house, bed mobility, transfers. 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 Franky Macho on 05/25/2022, adhesive arachnoiditis, nerve pain.  Patient denies hx of cancer, stroke, seizures, lung problems, heart problems, diabetes, unexplained weight loss, and osteoporosis.  SUBJECTIVE:                                                                                                                                                                                           SUBJECTIVE STATEMENT:     Patient arrives to session feeling good today with pain feeling slightly better than usual. He reports 3/10 pain in his low back, right hip/groin, back of thighs, and right shin/calf. Patient reports he felt a lot of pain leaving last session but eased that evening after a shower and resting. Patient states he has not had any near falls/falls since last session.   PAIN:  NPRS: 3/10 mostly in low back, right groin/hip, bilateral hamstrings, right shin and calf  PRECAUTIONS: Fall  PATIENT GOALS: "To get better, walk, drive again, climb stairs, play with my daughter, help around the house again"  NEXT MD  VISIT:   OBJECTIVE   TODAY'S TREATMENT:    Manual therapy: to reduce pain and tissue tension, improve range of motion, neuromodulation, in order to promote improved ability to complete functional activities. Hooklying R hip caudal/posterior glide with belt, 4x45 seconds, grade IV  Therapeutic exercise: to centralize symptoms and improve ROM, strength, muscular endurance, and activity tolerance required for successful completion of functional activities.   Superset/Circuit:  Seated overhead pull down (B shoulder extension from ~120  degrees flexion to bar on thighs) with cable bar  2x20  at 25# Discontinued during third set after 2 repetitions due to increase in pain down right leg  Seated CHEST press with  2x15 with sled bar (8.8#) + 7.5# AW  Seated lumbar flexion roll out with clear theraball, self selected pace and time per patient preference to calm pain.  4x patient preference.   Therapeutic activities: dynamic activities for functional strengthening and improved functional activity tolerance.   Blaze Pod Focus Setting with 4 pods, standing position at treadmill (tapping pods with feet)  1x2 min (20 hits)  Discontinued at 1 minute due to increase and pain and needing to sit down   1x1 min (24 hits)  Utilized wheelchair positioned behind patient for safety   Pt required multimodal cuing for proper technique and to facilitate improved neuromuscular control, strength, range of motion, and functional ability resulting in improved performance and form.  PATIENT EDUCATION:  Education details: Exercise purpose/form. Self management techniques.  Person educated: patient Education method: Explanation Education comprehension: verbalized understanding and needs further education  HOME EXERCISE PROGRAM: Verbally:  - seated lumbar flexion stretch with head down and one LE extended using rollator, 1x10 each side.  - curl up with legs elevated, 5x15 seconds - sidelying open book 1x10 each  side  Access Code: [email or web address] URL: [email or web address] Date: 06/24/2023 Prepared by: Norton Blizzard  Exercises - Median Nerve Flossing - Tray  - 3 x daily - 10 reps - Seated Cervical Retraction  - 1-2 x daily - 3 sets - 10 reps  ASSESSMENT:  CLINICAL IMPRESSION:     Patient arrives to session with improvement in low back/lower extremity pain since last session. The focus of today's session was to continue seated exercises to improve LE/UE/core strengthening to improve participation in functional activities. Modified overhead pull downs were discontinued during third due to significant increase in pain. Tolerance to blaze pod focus setting with foot taps in standing position improved with shortening duration to 1 minute. Patient tolerated all other exercises well and was able to reduce pain with periodic seated lumbar flexion roll outs with the theraball. Patient reports slight increase in nerve pain and low back/leg pain to 4/10 at end of session. Patient would benefit from continued management of limiting condition by skilled physical therapist to address remaining impairments and functional limitations to work towards stated goals and return to PLOF or maximal functional independence.    OBJECTIVE IMPAIRMENTS: Abnormal gait, decreased activity tolerance, decreased balance, decreased coordination, decreased endurance, decreased knowledge of condition, decreased knowledge of use of DME, decreased mobility, difficulty walking, decreased ROM, decreased strength, increased edema, impaired perceived functional ability, increased muscle spasms, impaired flexibility, impaired sensation, impaired tone, improper body mechanics, postural dysfunction, obesity, and pain.   ACTIVITY LIMITATIONS: carrying, lifting, bending, standing, squatting, stairs, transfers, bed mobility, continence, bathing, toileting, dressing, hygiene/grooming, locomotion level, and caring for others  PARTICIPATION  LIMITATIONS: meal prep, cleaning, laundry, interpersonal relationship, driving, shopping, community activity, occupation, yard work, and   difficulty with or unable to complete any activity that requires weight bearing, use of B LE, and/or balance including working, household and community mobility, walking, driving, going to family gatherings, avoiding incontinent episodes, playing with daughter, helping around the house, bed mobility, transfers  PERSONAL FACTORS: Past/current experiences, Time since onset of injury/illness/exacerbation, and 3+ comorbidities:   3 thoracic spine surgeries, thoracic MRI notes "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 Franky Macho on 05/25/2022, adhesive arachnoiditis, nerve pain are also affecting patient's functional outcome.   REHAB POTENTIAL: Fair due to severity and nature of condition.   CLINICAL DECISION MAKING: Evolving/moderate complexity  EVALUATION COMPLEXITY: Moderate   GOALS: Goals reviewed with patient? No  SHORT TERM GOALS: Target date: 01/09/2023. Target date updated to 05/28/2023 for all unmet goals on 03/05/2023.   Patient will be independent with initial home exercise program for self-management of symptoms. Baseline: Initial HEP to be provided at visit 2 as appropriate (12/26/22); Goal status: MET   LONG TERM GOALS: Target date: 03/20/2023. Target date updated to 08/26/23  for all unmet goals on 06/03/2023. Target date updated to 11/20/2023 for all  unmet goals on 08/28/2023.   Patient will be independent with a long-term home exercise program for self-management of symptoms.  Baseline: Initial HEP to be provided at visit 2 as  appropriate (12/26/22); participating as able (01/31/2023); patient currently participating as tolerated (03/05/2023; 04/09/2023; 06/03/2023);   Goal status: In-progress  2.  Patient will demonstrate improved FOTO by equal or greater than 10 points by visit #13 to demonstrate improvement in overall condition and self-reported functional ability.  Baseline: to be tested visit 2 as appropriate (12/26/22); 30 at visit #3 (01/02/2023); 46 at visit #13 (03/05/2023); 45 at visit #29 (06/03/2023); Goal status: MET  3.  Patient will demonstrate the ability to ambulate equal or greater than 600 feet with LRAD during the 6 minute walk to improve his household and community mobility.  Baseline: 34 feet with bari-RW (12/26/22); 200 feet with bari-RW (01/31/2023); 500 feet with BRW (03/05/2023); 400 feet with BRW (04/09/2023); 152 feet with BRW and W/C follow for safety (06/03/2023);  Goal status: Ongoing  4.  Patient will complete 5 Time Sit To Stand Test from 19.5 inch surface or lower in equal or less than 15 seconds with no UE support to demonstrate improved transfer ability for improved household and toileting mobility.  Baseline: 23 seconds with heavy B UE support on RW from 19.5 inch plinth. Pain throbbing down the right LE.  (12/26/22); 19 seconds with heavy B UE support on RW from 19.5 inch plinth (03/05/2023); 20 seconds with heavy B UE support on RRW from 18.5 inch plinth. Painful in back of B calves, thighs, and lower back (04/09/2023); 15 seconds with heavy B UE support on BRW from 18.5 inch plinth. Painful in back of B calves, thighs, and lower back (06/03/2023);  Goal status: In-progress  5.  Patient will report worst pain equal or less than 3/10 with functional activities to improve his ability to complete basic household and community mobility. Baseline: up to 8/10 (12/26/22); reports 3/10 pain (01/31/2023); reports 4/10 pain (02/19/2023); up to 7/10 in the last 2 week s(03/05/2023): up to  7/10 in the last  two weeks (04/09/2023); up to 8/10 in the last 2 weeks (06/03/2023);  Goal status: Ongoing   PLAN:  PT FREQUENCY: 1-2x/week  PT DURATION: 12 weeks  PLANNED INTERVENTIONS: Therapeutic exercises, Therapeutic activity, Neuromuscular re-education, Balance training, Gait training, Patient/Family education, Self Care, Joint mobilization, Stair training, Orthotic/Fit training, DME instructions, Aquatic Therapy, Dry Needling, Electrical stimulation, Wheelchair mobility training, Spinal mobilization, Cryotherapy, Moist heat, Manual therapy, and Re-evaluation.  PLAN FOR NEXT SESSION:  update HEP as appropriate, neurodynamics, gait training, LE/core/functional strengthening, stretching, and balance as tolerated, education, manual therapy as needed.  Terramuggus Carmack [place], SPT [employer] DPTE   Huntley Dec R. Ilsa Iha, PT, DPT 09/12/23, 12:00 PM  [facility] [address] [place], [place] [address] P: [phone] I F: [phone]

## 2023-09-16 ENCOUNTER — Ambulatory Visit: Payer: Medicare Other | Admitting: Physical Therapy

## 2023-09-18 ENCOUNTER — Ambulatory Visit: Payer: Medicare Other | Admitting: Physical Therapy

## 2023-09-24 ENCOUNTER — Ambulatory Visit: Payer: Medicare Other | Attending: Physical Medicine & Rehabilitation | Admitting: Physical Therapy

## 2023-09-24 DIAGNOSIS — M5416 Radiculopathy, lumbar region: Secondary | ICD-10-CM | POA: Diagnosis present

## 2023-09-24 DIAGNOSIS — R262 Difficulty in walking, not elsewhere classified: Secondary | ICD-10-CM | POA: Insufficient documentation

## 2023-09-24 DIAGNOSIS — M5459 Other low back pain: Secondary | ICD-10-CM | POA: Insufficient documentation

## 2023-09-24 DIAGNOSIS — R296 Repeated falls: Secondary | ICD-10-CM | POA: Diagnosis present

## 2023-09-24 DIAGNOSIS — M6281 Muscle weakness (generalized): Secondary | ICD-10-CM | POA: Diagnosis present

## 2023-09-24 DIAGNOSIS — R29818 Other symptoms and signs involving the nervous system: Secondary | ICD-10-CM | POA: Insufficient documentation

## 2023-09-24 NOTE — Therapy (Unsigned)
 OUTPATIENT PHYSICAL THERAPY TREATMENT   Patient Name: Travis Fitzpatrick MRN: [id number] DOB:[birth year], 43 y.o., male Today's Date: 09/25/23   END OF SESSION:  PT End of Session - 09/24/23 1645     Visit Number 48    Number of Visits 63    Date for PT Re-Evaluation 11/20/23    Authorization Type MEDICARE PART B reporting period from 08/05/2023    Progress Note Due on Visit 50    PT Start Time 1607    PT Stop Time 1647    PT Time Calculation (min) 40 min    Activity Tolerance Patient tolerated treatment well;Patient limited by pain    Behavior During Therapy [facility] for tasks assessed/performed                 Past Medical History:  Diagnosis Date   Bowel trouble    urgency   Medical history non-contributory    Urinary urgency    Past Surgical History:  Procedure Laterality Date   BACK SURGERY  2010   CIRCUMCISION     LUMBAR LAMINECTOMY/DECOMPRESSION MICRODISCECTOMY  07/20/2011   Procedure: LUMBAR LAMINECTOMY/DECOMPRESSION MICRODISCECTOMY;  Surgeon: Travis Fitzpatrick;  Location: [facility];  Service: Neurosurgery;  Laterality: N/A;  right thoracotomy with thoracic eight-nine discectomy and fusion   LUMBAR LAMINECTOMY/DECOMPRESSION MICRODISCECTOMY Right 10/06/2021   Procedure: Right Lumbar Four-Five Microdiscectomy, Right Lumbar Five-Sacal One Foraminotomy;  Surgeon: Travis Memos, MD;  Location: [facility];  Service: Neurosurgery;  Laterality: Right;  3C/RM 21   THORACIC DISCECTOMY  07/16/2012   Procedure: THORACIC DISCECTOMY;  Surgeon: Travis Hurt, MD;  Location: [facility];  Service: Neurosurgery;  Laterality: Right;  RIGHT Thoracic seven-eight  thoracic diskectomy via thoracotomy by Travis Fitzpatrick   THORACIC DISCECTOMY N/A 12/15/2014   Procedure: THORACIC SEVEN TO THORACIC NINE Laminectomy ;  Surgeon: Travis Memos, MD;  Location: [facility];  Service: Neurosurgery;  Laterality: N/A;   THORACIC DISCECTOMY N/A 06/06/2017   Procedure: LAMINECTOMY THORACIC NINE-TEN;  Surgeon:  Travis Memos, MD;  Location: [facility];  Service: Neurosurgery;  Laterality: N/A;  LAMINECTOMY THORACIC NINE-TEN   THORACOTOMY  07/20/2011   Procedure: THORACOTOMY OPEN FOR SPINE SURGERY;  Surgeon: Travis Blizzard, MD;  Location: [facility];  Service: Vascular;  Laterality: N/A;   THORACOTOMY  07/16/2012   Procedure: THORACOTOMY OPEN FOR SPINE SURGERY;  Surgeon: Travis Borne, MD;  Location: [facility];  Service: Thoracic;  Laterality: N/A;   Patient Active Problem List   Diagnosis Date Noted   Nerve pain 12/05/2022   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   S/P lumbar spinal fusion 05/25/2022   Synovial cyst of lumbar facet joint 05/25/2022   HNP (herniated nucleus pulposus), lumbar 10/06/2021   Hand pain 06/28/2021   Lumbar facet arthropathy 01/11/2021   Thoracic spondylosis with myelopathy 01/11/2021   Thoracic spinal stenosis 06/06/2017   Stenosis, spinal, thoracic 12/15/2014   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 09/22/2014   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/2011    PCP: Travis Fitzpatrick  REFERRING PROVIDER: Ranelle Oyster, MD  REFERRING DIAG: thoracic disc disease with myelopathy, nerve pain, adhesive arachnoiditis  Rationale for Evaluation and Treatment: Rehabilitation  THERAPY DIAG:  Other low back pain  Other symptoms and signs involving the nervous system  Radiculopathy, lumbar region  Muscle weakness (generalized)  Repeated falls  ONSET DATE:  Suddenly started having weakness 11 years ago, most recent episode of worsening October 2022, s/p right L4-5 laminotomy, microdiscectomy on 10/06/2021,  now s/p L4-5 PLIF by Travis. Franky Fitzpatrick on 05/25/2022.  PERTINENT HISTORY:  Patient is a 43 y.o. male who presents to outpatient physical therapy with a referral for medical diagnosis thoracic disc disease with myelopathy, nerve pain, adhesive arachnoiditis. This patient's chief complaints consist of disabling low back and R >L leg pain and  weakness with activity, bowel and bladder urgency, incontinence, and retention, leading to the following functional deficits: difficulty with or unable to complete any activity that requires weight bearing, use of B LE, and/or balance including working, household and community mobility, walking, driving, going to family gatherings, avoiding incontinent episodes, playing with daughter, helping around the house, bed mobility, transfers. 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 Franky Fitzpatrick on 05/25/2022, adhesive arachnoiditis, nerve pain.  Patient denies hx of cancer, stroke, seizures, lung problems, heart problems, diabetes, unexplained weight loss, and osteoporosis.  SUBJECTIVE:                                                                                                                                                                                           SUBJECTIVE STATEMENT:     Feeling okay today  Fell weekend before last and twisted right ankle - was unable to walk on it for several days Started to be able to walk again on Friday (09/20/23) Stayed in bed and when he needed to get out of bed, he used the wheelchair  Right leg gave out when he was walking out of the bathroom  Wife had to help him up - he wasn't able to get up on his own  Didn't go to doctor to get ankle looked at  Feels pressure in low back, pain down back of both thighs, and pain in right calf Travis Fitzpatrick hurts when walking and putting a lot of pressure on it  Hip has been feeling better (after laying in bed a lot)     PAIN:  NPRS: 5/10 mostly in low back, right groin/hip, bilateral hamstrings, right calf  PRECAUTIONS: Fall  PATIENT GOALS: "To get better, walk, drive again, climb stairs, play with my daughter, help around the house again"  NEXT MD VISIT:   OBJECTIVE   ATFL Anterior Drawer Test: Negative on R and L  Talar Tilt Test: Slight discomfort with varus tilt to  talus on right side.   Palpation and Observation at R ankle: No ecchymosis or excessive swelling. No excessive heat detected with palpation.  TODAY'S TREATMENT:    Manual therapy: to reduce pain and tissue tension, improve range of motion, neuromodulation, in order to promote improved ability to complete functional activities. Hooklying R hip caudal/posterior glide with belt, 3x45 seconds, grade IV  Therapeutic exercise: to centralize symptoms and improve ROM, strength, muscular endurance, and activity tolerance required for successful completion of functional activities.   Superset/Circuit:  Seated overhead pull down (B shoulder extension from ~120  degrees flexion to bar on thighs) with cable bar  3x20 at 20#  Seated CHEST press with  2x15 with sled bar (8.8#) + 7.5# AW   Seated lumbar flexion roll out with clear theraball, self selected pace and time per patient preference to calm pain.  2x patient preference  ATFL Anterior Drawer Test (see results above)  Talar Tilt Test (see results above)  Palpation and Observation to bilateral ankles (see results above)  Pt required multimodal cuing for proper technique and to facilitate improved neuromuscular control, strength, range of motion, and functional ability resulting in improved performance and form.   PATIENT EDUCATION:  Education details: Exercise purpose/form. Self management techniques.  Person educated: patient Education method: Explanation Education comprehension: verbalized understanding and needs further education  HOME EXERCISE PROGRAM: Verbally:  -  seated lumbar flexion stretch with head down and one LE extended using rollator, 1x10 each side.  - curl up with legs elevated, 5x15 seconds - sidelying open book 1x10 each side  Access Code: [email or web address] URL: [email or web address] Date: 06/24/2023 Prepared by: Travis Fitzpatrick  Exercises - Median Nerve Flossing - Tray  - 3 x daily - 10 reps - Seated Cervical Retraction  - 1-2 x daily - 3 sets - 10 reps  ASSESSMENT:  CLINICAL IMPRESSION:     Patient arrives to session after experiencing a fall two weekends ago that resulted in severe right ankle pain and was unable to walk for several days. Bilateral ankles assessed to determine status of right ankle after injury and if it was appropriate to continue with session. ATFL anterior drawer test was negative indicating there was not any ligamentous laxity or hypermobility in the right ankle. The Talar tilt test resulted in slight discomfort with varus tilt of the talus on the right side, which correlates with the inversion mechanism in which he rolled his ankle. No presence of ecchymosis or excessive swelling. No excessive heat detected with palpation. Based on findings, it was safe for patient to proceed with exercises. The focus of today's session was to continue with seated exercises to improve upper extremity and core strengthening to improve participation in functional activities. Patient tolerated exercises well and was able to reduce pain with periodic, self paced seated lumbar flexion roll outs with the theraball. Patient reports feeling relief in his right hip, groin, and posterior thighs at end of session. Patient would benefit from continued management of limiting condition by skilled physical therapist to address remaining impairments and functional limitations to work towards stated goals and return to PLOF or maximal functional independence.    OBJECTIVE IMPAIRMENTS: Abnormal gait, decreased activity tolerance, decreased balance,  decreased coordination, decreased endurance, decreased knowledge of condition, decreased knowledge of use of DME, decreased mobility, difficulty walking, decreased ROM, decreased strength, increased edema, impaired perceived functional ability, increased muscle spasms, impaired flexibility, impaired sensation, impaired tone, improper body mechanics, postural dysfunction, obesity, and pain.   ACTIVITY LIMITATIONS: carrying,  lifting, bending, standing, squatting, stairs, transfers, bed mobility, continence, bathing, toileting, dressing, hygiene/grooming, locomotion level, and caring for others  PARTICIPATION LIMITATIONS: meal prep, cleaning, laundry, interpersonal relationship, driving, shopping, community activity, occupation, yard work, and   difficulty with or unable to complete any activity that requires weight bearing, use of B LE, and/or balance including working, household and community mobility, walking, driving, going to family gatherings, avoiding incontinent episodes, playing with daughter, helping around the house, bed mobility, transfers  PERSONAL FACTORS: Past/current experiences, Time since onset of injury/illness/exacerbation, and 3+ comorbidities:   3 thoracic spine surgeries, thoracic MRI notes "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 Franky Fitzpatrick on 05/25/2022, adhesive arachnoiditis, nerve pain are also affecting patient's functional outcome.   REHAB POTENTIAL: Fair due to severity and nature of condition.   CLINICAL DECISION MAKING: Evolving/moderate complexity  EVALUATION COMPLEXITY: Moderate   GOALS: Goals reviewed with patient? No  SHORT TERM GOALS: Target date:  01/09/2023. Target date updated to 05/28/2023 for all unmet goals on 03/05/2023.   Patient will be independent with initial home exercise program for self-management of symptoms. Baseline: Initial HEP to be provided at visit 2 as appropriate (12/26/22); Goal status: MET   LONG TERM GOALS: Target date: 03/20/2023. Target date updated to 08/26/23  for all unmet goals on 06/03/2023. Target date updated to 11/20/2023 for all  unmet goals on 08/28/2023.   Patient will be independent with a long-term home exercise program for self-management of symptoms.  Baseline: Initial HEP to be provided at visit 2 as appropriate (12/26/22); participating as able (01/31/2023); patient currently participating as tolerated (03/05/2023; 04/09/2023; 06/03/2023);   Goal status: In-progress  2.  Patient will demonstrate improved FOTO by equal or greater than 10 points by visit #13 to demonstrate improvement in overall condition and self-reported functional ability.  Baseline: to be tested visit 2 as appropriate (12/26/22); 30 at visit #3 (01/02/2023); 46 at visit #13 (03/05/2023); 45 at visit #29 (06/03/2023); Goal status: MET  3.  Patient will demonstrate the ability to ambulate equal or greater than 600 feet with LRAD during the 6 minute walk to improve his household and community mobility.  Baseline: 34 feet with bari-RW (12/26/22); 200 feet with bari-RW (01/31/2023); 500 feet with BRW (03/05/2023); 400 feet with BRW (04/09/2023); 152 feet with BRW and W/C follow for safety (06/03/2023);  Goal status: Ongoing  4.  Patient will complete 5 Time Sit To Stand Test from 19.5 inch surface or lower in equal or less than 15 seconds with no UE support to demonstrate improved transfer ability for improved household and toileting mobility.  Baseline: 23 seconds with heavy B UE support on RW from 19.5 inch plinth. Pain throbbing down the right LE.  (12/26/22); 19 seconds with heavy B UE support on RW from 19.5 inch plinth (03/05/2023); 20  seconds with heavy B UE support on RRW from 18.5 inch plinth. Painful in back of B calves, thighs, and lower back (04/09/2023); 15 seconds with heavy B UE support on BRW from 18.5 inch plinth. Painful in back of B calves, thighs, and lower back (06/03/2023);  Goal status: In-progress  5.  Patient will report worst pain equal or less than 3/10 with functional activities to improve his ability  to complete basic household and community mobility. Baseline: up to 8/10 (12/26/22); reports 3/10 pain (01/31/2023); reports 4/10 pain (02/19/2023); up to 7/10 in the last 2 week s(03/05/2023): up to  7/10 in the last two weeks (04/09/2023); up to 8/10 in the last 2 weeks (06/03/2023);  Goal status: Ongoing   PLAN:  PT FREQUENCY: 1-2x/week  PT DURATION: 12 weeks  PLANNED INTERVENTIONS: Therapeutic exercises, Therapeutic activity, Neuromuscular re-education, Balance training, Gait training, Patient/Family education, Self Care, Joint mobilization, Stair training, Orthotic/Fit training, DME instructions, Aquatic Therapy, Dry Needling, Electrical stimulation, Wheelchair mobility training, Spinal mobilization, Cryotherapy, Moist heat, Manual therapy, and Re-evaluation.  PLAN FOR NEXT SESSION:  update HEP as appropriate, neurodynamics, gait training, LE/core/functional strengthening, stretching, and balance as tolerated, education, manual therapy as needed.   Richetta Cubillos [place], SPT [employer] DPTE   Huntley Dec R. Ilsa Iha, PT, DPT 09/25/23, 10:01 AM  [facility] [address] [place], [place] [address] P: [phone] I F: [phone]

## 2023-09-25 ENCOUNTER — Encounter: Payer: Medicare Other | Attending: Physical Medicine & Rehabilitation | Admitting: Physical Medicine & Rehabilitation

## 2023-09-25 ENCOUNTER — Encounter: Payer: Self-pay | Admitting: Physical Medicine & Rehabilitation

## 2023-09-25 VITALS — BP 122/88 | HR 90 | Ht 70.0 in | Wt 337.0 lb

## 2023-09-25 DIAGNOSIS — M4714 Other spondylosis with myelopathy, thoracic region: Secondary | ICD-10-CM

## 2023-09-25 DIAGNOSIS — M47816 Spondylosis without myelopathy or radiculopathy, lumbar region: Secondary | ICD-10-CM | POA: Diagnosis not present

## 2023-09-25 DIAGNOSIS — R252 Cramp and spasm: Secondary | ICD-10-CM

## 2023-09-25 DIAGNOSIS — G039 Meningitis, unspecified: Secondary | ICD-10-CM | POA: Diagnosis not present

## 2023-09-25 DIAGNOSIS — M792 Neuralgia and neuritis, unspecified: Secondary | ICD-10-CM

## 2023-09-25 DIAGNOSIS — M5104 Intervertebral disc disorders with myelopathy, thoracic region: Secondary | ICD-10-CM

## 2023-09-25 MED ORDER — DULOXETINE HCL 30 MG PO CPEP: 90.0000 mg | ORAL_CAPSULE | Freq: Every day | ORAL | 3 refills | Status: DC

## 2023-09-25 MED ORDER — BACLOFEN 10 MG PO TABS: 10.0000 mg | ORAL_TABLET | Freq: Three times a day (TID) | ORAL | 3 refills | Status: DC

## 2023-09-25 NOTE — Addendum Note (Signed)
Addended by: Faith Rogue T on: 09/25/2023 01:21 PM   Modules accepted: Level of Service

## 2023-09-25 NOTE — Patient Instructions (Signed)
ALWAYS FEEL FREE TO CALL OUR OFFICE WITH ANY PROBLEMS OR QUESTIONS [phone])  **PLEASE NOTE** ALL MEDICATION REFILL REQUESTS (INCLUDING CONTROLLED SUBSTANCES) NEED TO BE MADE AT LEAST 7 DAYS PRIOR TO REFILL BEING DUE. ANY REFILL REQUESTS INSIDE THAT TIME FRAME MAY RESULT IN DELAYS IN RECEIVING YOUR PRESCRIPTION.

## 2023-09-25 NOTE — Progress Notes (Signed)
Subjective:    Patient ID: Travis Fitzpatrick, male    DOB: 1981-06-08, 43 y.o.   MRN: [id number]  HPI  The patient has consented to a tele-health/telephone encounter with [facility]. The patient is at home and the provider is at the office. Two separate patient identifiers were utilized to confirm the identity of the patient.  We discussed the limitations of evaluation and management by telemedicine and the availability of in person appointments. The patient expressed understanding and agreed to proceed.   Travis Fitzpatrick is meeting me today regarding his thoracic myelopathy and presumptive lumbar adhesive arachnoiditis. He has good and bad days. Some days he moves and then days where he can't get out of bed. The pain is mostly in his low back and posterior thighs. It's worst when he stands or walks.  He has not been back to see NS as he said everything looked ok.   He is still going to therapy twice a week. He is walking at home, stretching, etc. His weight is down to 337 lbs.   He remains on cymbalta, gabapentin, nortriptyline, and voltaren for pain. He never received the higher dose of cymbalta from the pharmacy.   Deree reports increasing muscle spasms in both legs. They seem to be increasing over the last few months.   He saw urology for his bladder. They placed him on myrbetriq which has helped with his frequency and emptying in general. He feels constipated. He incompletely emptied.   Pain Inventory Average Pain 5 Pain Right Now 4 My pain is sharp and shooting and pressure  In the last 24 hours, has pain interfered with the following? General activity 4 Relation with others 0 Enjoyment of life 0 What TIME of day is your pain at its worst? varies Sleep (in general) Fair  Pain is worse with: walking and standing Pain improves with: heat/ice, medication, and fetal position Relief from Meds: 6  Family History  Problem Relation Age of Onset    Hypertension Father    Social History   Socioeconomic History   Marital status: Single    Spouse name: Not on file   Number of children: Not on file   Years of education: Not on file   Highest education level: Not on file  Occupational History   Not on file  Tobacco Use   Smoking status: Never   Smokeless tobacco: Never  Vaping Use   Vaping status: Never Used  Substance and Sexual Activity   Alcohol use: No    Alcohol/week: 0.0 standard drinks of alcohol   Drug use: No   Sexual activity: Never  Other Topics Concern   Not on file  Social History Narrative   Not on file   Social Drivers of Health   Financial Resource Strain: Medium Risk (08/30/2022)   Received from [facility], [facility]   Overall Financial Resource Strain (CARDIA)    Difficulty of Paying Living Expenses: Somewhat hard  Food Insecurity: Food Insecurity Present (08/30/2022)   Received from [facility], [facility]   Hunger Vital Sign    Worried About Running Out of Food in the Last Year: Never true    Ran Out of Food in the Last Year: Sometimes true  Transportation Needs: No Transportation Needs (01/25/2023)   Received from [facility], [facility]   [facility] - Transportation    Lack of Transportation (Medical): No  Lack of Transportation (Non-Medical): No  Physical Activity: Not on file  Stress: Not on file  Social Connections: Not on file   Past Surgical History:  Procedure Laterality Date   BACK SURGERY  2010   CIRCUMCISION     LUMBAR LAMINECTOMY/DECOMPRESSION MICRODISCECTOMY  07/20/2011   Procedure: LUMBAR LAMINECTOMY/DECOMPRESSION MICRODISCECTOMY;  Surgeon: Carmela Hurt;  [facility]: [facility];  Service: Neurosurgery;  Laterality: N/A;  right thoracotomy with thoracic eight-nine discectomy and fusion   LUMBAR LAMINECTOMY/DECOMPRESSION MICRODISCECTOMY Right 10/06/2021   Procedure: Right Lumbar Four-Five  Microdiscectomy, Right Lumbar Five-Sacal One Foraminotomy;  Surgeon: Coletta Memos, MD;  Location: [facility];  Service: Neurosurgery;  Laterality: Right;  3C/RM 21   THORACIC DISCECTOMY  07/16/2012   Procedure: THORACIC DISCECTOMY;  Surgeon: Carmela Hurt, MD;  Location: [facility];  Service: Neurosurgery;  Laterality: Right;  RIGHT Thoracic seven-eight  thoracic diskectomy via thoracotomy by dr Laneta Simmers   THORACIC DISCECTOMY N/A 12/15/2014   Procedure: THORACIC SEVEN TO THORACIC NINE Laminectomy ;  Surgeon: Coletta Memos, MD;  [facility]: [facility];  Service: Neurosurgery;  Laterality: N/A;   THORACIC DISCECTOMY N/A 06/06/2017   Procedure: LAMINECTOMY THORACIC NINE-TEN;  Surgeon: Coletta Memos, MD;  [facility]: [facility];  Service: Neurosurgery;  Laterality: N/A;  LAMINECTOMY THORACIC NINE-TEN   THORACOTOMY  07/20/2011   Procedure: THORACOTOMY OPEN FOR SPINE SURGERY;  Surgeon: Norton Blizzard, MD;  [facility]: [facility];  Service: Vascular;  Laterality: N/A;   THORACOTOMY  07/16/2012   Procedure: THORACOTOMY OPEN FOR SPINE SURGERY;  Surgeon: Alleen Borne, MD;  [facility];  Service: Thoracic;  Laterality: N/A;   Past Surgical History:  Procedure Laterality Date   BACK SURGERY  2010   CIRCUMCISION     LUMBAR LAMINECTOMY/DECOMPRESSION MICRODISCECTOMY  07/20/2011   Procedure: LUMBAR LAMINECTOMY/DECOMPRESSION MICRODISCECTOMY;  Surgeon: Carmela Hurt;  [facility];  Service: Neurosurgery;  Laterality: N/A;  right thoracotomy with thoracic eight-nine discectomy and fusion   LUMBAR LAMINECTOMY/DECOMPRESSION MICRODISCECTOMY Right 10/06/2021   Procedure: Right Lumbar Four-Five Microdiscectomy, Right Lumbar Five-Sacal One Foraminotomy;  Surgeon: Coletta Memos, MD;  [facility];  Service: Neurosurgery;  Laterality: Right;  3C/RM 21   THORACIC DISCECTOMY  07/16/2012   Procedure: THORACIC DISCECTOMY;  Surgeon: Carmela Hurt, MD;  [facility];  Service: Neurosurgery;  Laterality:  Right;  RIGHT Thoracic seven-eight  thoracic diskectomy via thoracotomy by dr Laneta Simmers   THORACIC DISCECTOMY N/A 12/15/2014   Procedure: THORACIC SEVEN TO THORACIC NINE Laminectomy ;  Surgeon: Coletta Memos, MD;  [facility];  Service: Neurosurgery;  Laterality: N/A;   THORACIC DISCECTOMY N/A 06/06/2017   Procedure: LAMINECTOMY THORACIC NINE-TEN;  Surgeon: Coletta Memos, MD;  [facility];  Service: Neurosurgery;  Laterality: N/A;  LAMINECTOMY THORACIC NINE-TEN   THORACOTOMY  07/20/2011   Procedure: THORACOTOMY OPEN FOR SPINE SURGERY;  Surgeon: Algis Downs Karle Plumber, MD;  [facility];  Service: Vascular;  Laterality: N/A;   THORACOTOMY  07/16/2012   Procedure: THORACOTOMY OPEN FOR SPINE SURGERY;  Surgeon: Alleen Borne, MD;  [facility];  Service: Thoracic;  Laterality: N/A;   Past Medical History:  Diagnosis Date   Bowel trouble    urgency   Medical history non-contributory    Urinary urgency    BP 122/88   Pulse 90   Ht 5\' 10"  (1.778 m)   Wt (!) 337 lb (152.9 kg)   BMI 48.35 kg/m   Opioid Risk Score:  Fall Risk Score:  `1  Depression screen PHQ 2/9     03/13/2023    1:10 PM 12/19/2022    2:41 PM 12/05/2022   10:16 AM 06/28/2021    9:29 AM 03/29/2021    9:39 AM 01/11/2021   10:19 AM  Depression screen PHQ 2/9  Decreased Interest 0 0 0 0 1 3  Down, Depressed, Hopeless 0 0 0 0 1 1  PHQ - 2 Score 0 0 0 0 2 4  Altered sleeping      3  Tired, decreased energy      3  Change in appetite      3  Feeling bad or failure about yourself       0  Trouble concentrating      2  Moving slowly or fidgety/restless      1  Suicidal thoughts      0  PHQ-9 Score      16     Review of Systems  Musculoskeletal:  Positive for back pain.       Groin and hip pain  Bilateral leg pain, more in the right leg  All other systems reviewed and are negative.     Objective:   Physical Exam Virtual visit       Assessment & Plan:  1. Hx of thoracic myelopathy with  residual sensory deficits from trunk down.  He has a T8 sensory level on exam 2.  Lumbar spondylosis/ddd s/p decompression and fusion  3.  Right hip pain concerning for OA 4. See no sign of neurogenic bowel or bladder. Pt does have back pain which he pushes to empty his bowels on toilet. 5. Morbid obesity 6. Adhesive arachnoiditis.    Plan: 1.  Increase gabapentin to 600 mg qid.   2.  Increase nortriptyline to 25mg  qhs 2.  Continue diclofenac 75 mg twice daily with food.   3.  Referral for zynex Nexwave device for low back and buttock pain 4.  continue outpt therapy to address LE strengthn rom, pain, etc. Dicussed the importance of staying active. 5.  Increase cymbalta to 90mg  daily. Sent another rx   6.  Consider suppository for helping empty bowels completely 7. Add baclofen 10mg  bid for spasms, can increase to tid in one week.      20+ minutes of phone time were spent during this visit. All questions were encouraged and answered. Follow up with me in 3 mos.

## 2023-09-26 ENCOUNTER — Ambulatory Visit: Payer: Medicare Other | Admitting: Physical Therapy

## 2023-10-01 ENCOUNTER — Ambulatory Visit: Payer: Medicare Other | Admitting: Physical Therapy

## 2023-10-01 DIAGNOSIS — M5459 Other low back pain: Secondary | ICD-10-CM | POA: Diagnosis not present

## 2023-10-01 DIAGNOSIS — M5416 Radiculopathy, lumbar region: Secondary | ICD-10-CM

## 2023-10-01 DIAGNOSIS — R29818 Other symptoms and signs involving the nervous system: Secondary | ICD-10-CM

## 2023-10-01 DIAGNOSIS — R262 Difficulty in walking, not elsewhere classified: Secondary | ICD-10-CM

## 2023-10-01 DIAGNOSIS — R296 Repeated falls: Secondary | ICD-10-CM

## 2023-10-01 DIAGNOSIS — M6281 Muscle weakness (generalized): Secondary | ICD-10-CM

## 2023-10-01 NOTE — Therapy (Signed)
 OUTPATIENT PHYSICAL THERAPY TREATMENT   Patient Name: Travis Fitzpatrick MRN: [id number] DOB:04/05/1981, 43 y.o., male Today's Date: 10/01/23   END OF SESSION:  PT End of Session - 10/01/23 1639     Visit Number 49    Number of Visits 63    Date for PT Re-Evaluation 11/20/23    Authorization Type MEDICARE PART B reporting period from 08/05/2023    Progress Note Due on Visit 50    PT Start Time 1608    PT Stop Time 1646    PT Time Calculation (min) 38 min    Activity Tolerance Patient tolerated treatment well;Patient limited by pain    Behavior During Therapy [facility] for tasks assessed/performed                  Past Medical History:  Diagnosis Date   Bowel trouble    urgency   Medical history non-contributory    Urinary urgency    Past Surgical History:  Procedure Laterality Date   BACK SURGERY  2010   CIRCUMCISION     LUMBAR LAMINECTOMY/DECOMPRESSION MICRODISCECTOMY  07/20/2011   Procedure: LUMBAR LAMINECTOMY/DECOMPRESSION MICRODISCECTOMY;  Surgeon: Carmela Hurt;  [facility];  Service: Neurosurgery;  Laterality: N/A;  right thoracotomy with thoracic eight-nine discectomy and fusion   LUMBAR LAMINECTOMY/DECOMPRESSION MICRODISCECTOMY Right 10/06/2021   Procedure: Right Lumbar Four-Five Microdiscectomy, Right Lumbar Five-Sacal One Foraminotomy;  Surgeon: Coletta Memos, MD;  [facility];  Service: Neurosurgery;  Laterality: Right;  3C/RM 21   THORACIC DISCECTOMY  07/16/2012   Procedure: THORACIC DISCECTOMY;  Surgeon: Carmela Hurt, MD;  [facility];  Service: Neurosurgery;  Laterality: Right;  RIGHT Thoracic seven-eight  thoracic diskectomy via thoracotomy by dr Laneta Simmers   THORACIC DISCECTOMY N/A 12/15/2014   Procedure: THORACIC SEVEN TO THORACIC NINE Laminectomy ;  Surgeon: Coletta Memos, MD;  [facility];  Service: Neurosurgery;  Laterality: N/A;   THORACIC DISCECTOMY N/A 06/06/2017   Procedure: LAMINECTOMY THORACIC NINE-TEN;  Surgeon:  Coletta Memos, MD;  [facility];  Service: Neurosurgery;  Laterality: N/A;  LAMINECTOMY THORACIC NINE-TEN   THORACOTOMY  07/20/2011   Procedure: THORACOTOMY OPEN FOR SPINE SURGERY;  Surgeon: Norton Blizzard, MD;  [facility];  Service: Vascular;  Laterality: N/A;   THORACOTOMY  07/16/2012   Procedure: THORACOTOMY OPEN FOR SPINE SURGERY;  Surgeon: Alleen Borne, MD;  [facility];  Service: Thoracic;  Laterality: N/A;   Patient Active Problem List   Diagnosis Date Noted   Spasticity 09/25/2023   Nerve pain 12/05/2022   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   S/P lumbar spinal fusion 05/25/2022   Synovial cyst of lumbar facet joint 05/25/2022   HNP (herniated nucleus pulposus), lumbar 10/06/2021   Hand pain 06/28/2021   Lumbar facet arthropathy 01/11/2021   Thoracic spondylosis with myelopathy 01/11/2021   Thoracic spinal stenosis 06/06/2017   Stenosis, spinal, thoracic 12/15/2014   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 09/22/2014   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/2011    PCP: Nira Retort  REFERRING PROVIDER: Ranelle Oyster, MD  REFERRING DIAG: thoracic disc disease with myelopathy, nerve pain, adhesive arachnoiditis  Rationale for Evaluation and Treatment: Rehabilitation  THERAPY DIAG:  Other low back pain  Other symptoms and signs involving the nervous system  Radiculopathy, lumbar region  Muscle weakness (generalized)  Repeated falls  Difficulty in walking, not elsewhere classified  ONSET DATE:  Suddenly started having weakness 11 years ago, most recent  episode of worsening October 2022, s/p right L4-5 laminotomy, microdiscectomy on 10/06/2021, now s/p L4-5 PLIF by Dr. Franky Macho on 05/25/2022.  PERTINENT HISTORY:  Patient is a 43 y.o. male who presents to outpatient physical therapy with a referral for medical diagnosis thoracic disc disease with myelopathy, nerve pain, adhesive arachnoiditis. This  patient's chief complaints consist of disabling low back and R >L leg pain and weakness with activity, bowel and bladder urgency, incontinence, and retention, leading to the following functional deficits: difficulty with or unable to complete any activity that requires weight bearing, use of B LE, and/or balance including working, household and community mobility, walking, driving, going to family gatherings, avoiding incontinent episodes, playing with daughter, helping around the house, bed mobility, transfers. 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 Franky Macho on 05/25/2022, adhesive arachnoiditis, nerve pain.  Patient denies hx of cancer, stroke, seizures, lung problems, heart problems, diabetes, unexplained weight loss, and osteoporosis.  SUBJECTIVE:                                                                                                                                                                                           SUBJECTIVE STATEMENT:     Patient states today has been a good day, and it is the best day in a while. He states after last PT session he went home and took a shower and tried to get up and his back was really tight and he had sharp pains down the back of his legs so he went to bed and could not get up. He states he was feeling worse before he came into PT that day and does not attribute his worsened pain to what he did in PT. He cancelled his last PT session because his body would not allow him to go. He was able to see his doctor by zoom and they increased his  medications, which he started taking but has not noticed a meaningful difference. Ankle is no longer bothering him.    PAIN:  NPRS: 3/10 mostly in low back, right groin/hip, bilateral hamstrings, right calf  PRECAUTIONS: Fall  PATIENT GOALS: "To get better, walk, drive again, climb stairs, play with my daughter, help around the house again"  NEXT MD VISIT:   OBJECTIVE   TODAY'S TREATMENT:  Manual therapy: to reduce pain and tissue tension, improve range of motion, neuromodulation, in order to promote improved ability to complete functional activities. Hooklying R hip caudal/posterior glide with belt, 3x45 seconds, grade IV  Therapeutic exercise: to centralize symptoms and improve ROM, strength, muscular endurance, and activity tolerance required for successful completion of functional activities.   Superset/Circuit:  Seated overhead pull down (B shoulder extension from ~120  degrees flexion to bar on thighs) with cable bar  3x20 at 25#  Seated CHEST press with  3x15 with sled bar (8.8#) + 15# AW (felt too light at first per patient)  Seated Single Arm Row:  2x20 each side with 15# cable Cuing for abdominal brace Discontinued after it started to get painful with second set on the right  Therapeutic activities: dynamic therapeutic activities incorporating MULTIPLE parameters or areas of the body designed to achieve improved functional performance.  TRX sit <> stand from 17 inch grey chair 2x5 with effort to maintain position of spine that does not provoke symptoms.   Pt required multimodal cuing for proper technique and to facilitate improved neuromuscular control, strength, range of motion, and functional ability resulting in improved performance and form.   PATIENT EDUCATION:  Education details: Exercise purpose/form. Self management techniques.  Person educated: patient Education method: Explanation Education comprehension: verbalized understanding and needs further  education  HOME EXERCISE PROGRAM: Verbally:  - seated lumbar flexion stretch with head down and one LE extended using rollator, 1x10 each side.  - curl up with legs elevated, 5x15 seconds - sidelying open book 1x10 each side  Access Code: [email or web address] URL: [email or web address] Date: 06/24/2023 Prepared by: Norton Blizzard  Exercises - Median Nerve Flossing - Tray  - 3 x daily - 10 reps - Seated Cervical Retraction  - 1-2 x daily - 3 sets - 10 reps  ASSESSMENT:  CLINICAL IMPRESSION:     Patient arrives feeling pretty good today after several days of difficulty getting out of bed due to worse symptoms. Exercises were kept mostly in seated position to help decrease chance of provoking symptoms. Overall patient had decreased symptoms and improved tolerance for exercises, including increasing load on familiar seated exercises. He also tolerated some seated single arm rowing that was designed to help with anti-rotation and multifidus strengthening, but he was unable to complete all three planned sets due to increasing lumbar irritation with R row. He was able to tolerate TRX sit <> stand and found them challenging. This new possibly pain provoking exercise was limited to 2x5 to help prevent increased pain later. Patient reported feeling better than usual at the end of the session. Plan to complete progress note at next session. Patient would benefit from continued management of limiting condition by skilled physical therapist to address remaining impairments and functional limitations to work towards stated goals and return to PLOF or maximal functional independence.   OBJECTIVE IMPAIRMENTS: Abnormal gait, decreased activity tolerance, decreased balance, decreased coordination, decreased endurance, decreased knowledge of condition, decreased knowledge of use of DME, decreased mobility, difficulty walking, decreased ROM, decreased strength, increased edema, impaired perceived functional  ability, increased muscle spasms, impaired flexibility, impaired sensation, impaired tone, improper body mechanics, postural dysfunction, obesity, and pain.   ACTIVITY LIMITATIONS: carrying, lifting, bending, standing, squatting, stairs, transfers, bed mobility, continence, bathing, toileting, dressing, hygiene/grooming, locomotion level, and caring for others  PARTICIPATION LIMITATIONS: meal prep, cleaning, laundry, interpersonal relationship, driving, shopping, community activity, occupation, yard work, and   difficulty with or unable to complete any activity that  requires weight bearing, use of B LE, and/or balance including working, household and community mobility, walking, driving, going to family gatherings, avoiding incontinent episodes, playing with daughter, helping around the house, bed mobility, transfers  PERSONAL FACTORS: Past/current experiences, Time since onset of injury/illness/exacerbation, and 3+ comorbidities:   3 thoracic spine surgeries, thoracic MRI notes "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 Franky Macho on 05/25/2022, adhesive arachnoiditis, nerve pain are also affecting patient's functional outcome.   REHAB POTENTIAL: Fair due to severity and nature of condition.   CLINICAL DECISION MAKING: Evolving/moderate complexity  EVALUATION COMPLEXITY: Moderate   GOALS: Goals reviewed with patient? No  SHORT TERM GOALS: Target date: 01/09/2023. Target date updated to 05/28/2023 for all unmet goals on 03/05/2023.   Patient will be independent with initial home exercise program for self-management of symptoms. Baseline: Initial HEP to be provided at visit 2 as  appropriate (12/26/22); Goal status: MET   LONG TERM GOALS: Target date: 03/20/2023. Target date updated to 08/26/23  for all unmet goals on 06/03/2023. Target date updated to 11/20/2023 for all  unmet goals on 08/28/2023.   Patient will be independent with a long-term home exercise program for self-management of symptoms.  Baseline: Initial HEP to be provided at visit 2 as appropriate (12/26/22); participating as able (01/31/2023); patient currently participating as tolerated (03/05/2023; 04/09/2023; 06/03/2023);   Goal status: In-progress  2.  Patient will demonstrate improved FOTO by equal or greater than 10 points by visit #13 to demonstrate improvement in overall condition and self-reported functional ability.  Baseline: to be tested visit 2 as appropriate (12/26/22); 30 at visit #3 (01/02/2023); 46 at visit #13 (03/05/2023); 45 at visit #29 (06/03/2023); Goal status: MET  3.  Patient will demonstrate the ability to ambulate equal or greater than 600 feet with LRAD during the 6 minute walk to improve his household and community mobility.  Baseline: 34 feet with bari-RW (12/26/22); 200 feet with bari-RW (01/31/2023); 500 feet with BRW (03/05/2023); 400 feet with BRW (04/09/2023); 152 feet with BRW and W/C follow for safety (06/03/2023);  Goal status: Ongoing  4.  Patient will complete 5 Time Sit To Stand Test from 19.5 inch surface or lower in equal or less than 15 seconds with no UE support to demonstrate improved transfer ability for improved household and toileting mobility.  Baseline: 23 seconds with heavy B UE support on RW from 19.5 inch plinth. Pain throbbing down the right LE.  (12/26/22); 19 seconds with heavy B UE support on RW from 19.5 inch plinth (03/05/2023); 20 seconds with heavy B UE support on RRW from 18.5 inch plinth. Painful in back of B calves, thighs, and lower back (04/09/2023); 15 seconds with heavy B UE support on BRW from 18.5 inch plinth. Painful in back of B calves, thighs, and  lower back (06/03/2023);  Goal status: In-progress  5.  Patient will report worst pain equal or less than 3/10 with functional activities to improve his ability to complete basic household and community mobility. Baseline: up to 8/10 (12/26/22); reports 3/10 pain (01/31/2023); reports 4/10 pain (02/19/2023); up to 7/10 in the last 2 week s(03/05/2023): up to  7/10 in the last two weeks (04/09/2023); up to 8/10 in the last 2 weeks (  06/03/2023);  Goal status: Ongoing   PLAN:  PT FREQUENCY: 1-2x/week  PT DURATION: 12 weeks  PLANNED INTERVENTIONS: Therapeutic exercises, Therapeutic activity, Neuromuscular re-education, Balance training, Gait training, Patient/Family education, Self Care, Joint mobilization, Stair training, Orthotic/Fit training, DME instructions, Aquatic Therapy, Dry Needling, Electrical stimulation, Wheelchair mobility training, Spinal mobilization, Cryotherapy, Moist heat, Manual therapy, and Re-evaluation.  PLAN FOR NEXT SESSION:  update HEP as appropriate, neurodynamics, gait training, LE/core/functional strengthening, stretching, and balance as tolerated, education, manual therapy as needed.   Luretha Murphy. Ilsa Iha, PT, DPT 10/01/23, 6:13 PM  [facility] [address] [place], [place] [address] P: [phone] I F: [phone]

## 2023-10-03 ENCOUNTER — Ambulatory Visit: Payer: Medicare Other | Admitting: Physical Therapy

## 2023-10-08 ENCOUNTER — Encounter: Payer: Self-pay | Admitting: Physical Therapy

## 2023-10-08 ENCOUNTER — Ambulatory Visit: Payer: Medicare Other | Admitting: Physical Therapy

## 2023-10-08 DIAGNOSIS — M6281 Muscle weakness (generalized): Secondary | ICD-10-CM

## 2023-10-08 DIAGNOSIS — M5416 Radiculopathy, lumbar region: Secondary | ICD-10-CM

## 2023-10-08 DIAGNOSIS — R296 Repeated falls: Secondary | ICD-10-CM

## 2023-10-08 DIAGNOSIS — M5459 Other low back pain: Secondary | ICD-10-CM | POA: Diagnosis not present

## 2023-10-08 DIAGNOSIS — R29818 Other symptoms and signs involving the nervous system: Secondary | ICD-10-CM

## 2023-10-08 DIAGNOSIS — R262 Difficulty in walking, not elsewhere classified: Secondary | ICD-10-CM

## 2023-10-08 NOTE — Therapy (Signed)
 OUTPATIENT PHYSICAL THERAPY TREATMENT  / PROGRESS NOTE Dates of reporting from 08/05/2023 - 10/08/2023  Patient Name: Travis Fitzpatrick MRN: [id number] DOB:1980/10/09, 43 y.o., male Today's Date: 10/08/23   END OF SESSION:  PT End of Session - 10/08/23 1709     Visit Number 50    Number of Visits 63    Date for PT Re-Evaluation 11/20/23    Authorization Type MEDICARE PART B reporting period from 08/05/2023    Progress Note Due on Visit 50    PT Start Time 1606    PT Stop Time 1649    PT Time Calculation (min) 43 min    Activity Tolerance Patient limited by pain    Behavior During Therapy [facility] for tasks assessed/performed              Past Medical History:  Diagnosis Date   Bowel trouble    urgency   Medical history non-contributory    Urinary urgency    Past Surgical History:  Procedure Laterality Date   BACK SURGERY  2010   CIRCUMCISION     LUMBAR LAMINECTOMY/DECOMPRESSION MICRODISCECTOMY  07/20/2011   Procedure: LUMBAR LAMINECTOMY/DECOMPRESSION MICRODISCECTOMY;  Surgeon: Carmela Hurt;  Location: [facility];  Service: Neurosurgery;  Laterality: N/A;  right thoracotomy with thoracic eight-nine discectomy and fusion   LUMBAR LAMINECTOMY/DECOMPRESSION MICRODISCECTOMY Right 10/06/2021   Procedure: Right Lumbar Four-Five Microdiscectomy, Right Lumbar Five-Sacal One Foraminotomy;  Surgeon: Coletta Memos, MD;  Location: [facility];  Service: Neurosurgery;  Laterality: Right;  3C/RM 21   THORACIC DISCECTOMY  07/16/2012   Procedure: THORACIC DISCECTOMY;  Surgeon: Carmela Hurt, MD;  Location: [facility];  Service: Neurosurgery;  Laterality: Right;  RIGHT Thoracic seven-eight  thoracic diskectomy via thoracotomy by dr Laneta Simmers   THORACIC DISCECTOMY N/A 12/15/2014   Procedure: THORACIC SEVEN TO THORACIC NINE Laminectomy ;  Surgeon: Coletta Memos, MD;  Location: [facility];  Service: Neurosurgery;  Laterality: N/A;   THORACIC DISCECTOMY N/A 06/06/2017   Procedure: LAMINECTOMY THORACIC  NINE-TEN;  Surgeon: Coletta Memos, MD;  Location: [facility];  Service: Neurosurgery;  Laterality: N/A;  LAMINECTOMY THORACIC NINE-TEN   THORACOTOMY  07/20/2011   Procedure: THORACOTOMY OPEN FOR SPINE SURGERY;  Surgeon: Norton Blizzard, MD;  Location: [facility];  Service: Vascular;  Laterality: N/A;   THORACOTOMY  07/16/2012   Procedure: THORACOTOMY OPEN FOR SPINE SURGERY;  Surgeon: Alleen Borne, MD;  Location: [facility];  Service: Thoracic;  Laterality: N/A;   Patient Active Problem List   Diagnosis Date Noted   Spasticity 09/25/2023   Nerve pain 12/05/2022   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   S/P lumbar spinal fusion 05/25/2022   Synovial cyst of lumbar facet joint 05/25/2022   HNP (herniated nucleus pulposus), lumbar 10/06/2021   Hand pain 06/28/2021   Lumbar facet arthropathy 01/11/2021   Thoracic spondylosis with myelopathy 01/11/2021   Thoracic spinal stenosis 06/06/2017   Stenosis, spinal, thoracic 12/15/2014   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 09/22/2014   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/2011    PCP: Nira Retort  REFERRING PROVIDER: Ranelle Oyster, MD  REFERRING DIAG: thoracic disc disease with myelopathy, nerve pain, adhesive arachnoiditis  Rationale for Evaluation and Treatment: Rehabilitation  THERAPY DIAG:  Other low back pain  Other symptoms and signs involving the nervous system  Radiculopathy, lumbar region  Muscle weakness (generalized)  Repeated falls  Difficulty in walking, not elsewhere classified  ONSET DATE:  Suddenly started having weakness 11 years  ago, most recent episode of worsening October 2022, s/p right L4-5 laminotomy, microdiscectomy on 10/06/2021, now s/p L4-5 PLIF by Dr. Franky Macho on 05/25/2022.  PERTINENT HISTORY:  Patient is a 43 y.o. male who presents to outpatient physical therapy with a referral for medical diagnosis thoracic disc disease with myelopathy, nerve pain, adhesive  arachnoiditis. This patient's chief complaints consist of disabling low back and R >L leg pain and weakness with activity, bowel and bladder urgency, incontinence, and retention, leading to the following functional deficits: difficulty with or unable to complete any activity that requires weight bearing, use of B LE, and/or balance including working, household and community mobility, walking, driving, going to family gatherings, avoiding incontinent episodes, playing with daughter, helping around the house, bed mobility, transfers. 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 Franky Macho on 05/25/2022, adhesive arachnoiditis, nerve pain.  Patient denies hx of cancer, stroke, seizures, lung problems, heart problems, diabetes, unexplained weight loss, and osteoporosis.  SUBJECTIVE:                                                                                                                                                                                           SUBJECTIVE STATEMENT:     Patient states he is doing alright today. He states he did not feel good after last PT session. He states that when he got home it felt like something locked up in his low back. It didn't get to where he could not walk but it was really painful in his right groin and hip region when he put weight on it. However, he could put weight on it the leg without it giving out. It is still feeling like that but is not as bad as last week. It is more manageable now. He is noticing improvements over time. He reports he is  getting up easier from the chair and the floor. Walking is easier. He can tell his core is stronger. No falls since last PT session. HEP is going okay. He does the supine exercises daily and the standing exercises when he is not hurting so much.   PAIN:  NPRS: 6/10 mostly in low back, right groin/hip, bilateral hamstrings, right calf  PRECAUTIONS: Fall  PATIENT GOALS: "To get better, walk, drive again, climb stairs, play with  my daughter, help around the house again"  NEXT MD VISIT:   OBJECTIVE   SELF-REPORTED FUNCTION FOTO score: 49/100 (Inflammatory Diseases of the Nervous System) questionnaire)     SELF-REPORTED FUNCTION Patient Specific Functional Scale (PSFS)  Walking Around People: 3 Enjoying Life: 2 Driving: 0 Average: 7.8/29  FUNCTIONAL/BALANCE TESTS: 6 Minute Walk Test:  Purpose: to assess function with community mobility Score: 330 feet with BRW.  Test concluded early with 2:52 min remaining due to low back, R hip, and nerve pain as well as progressive LE weakness.  Pain up to 7-8/10.   Time to administer test: 6 minutes (explanation and test) Analysis: patient ambulated significantly less than 1890 feet which is the norm for healthy males of his age, but he ambulated significantly more than he did when it was last measured at 152 feet on 06/03/2023. He continues to be limited by low back pain, R hip pain, and neurologic pain and weakness in B LE with weight bearing activities.    Five Time Sit to Stand (5TSTS):  Purpose: to assess LE functional strength and power for functional mobility.  Score: 19 seconds from 18.5 inch plinth heavy B UE support on BRW  Test concluded early with 2:52 min remaining due to low back, R hip, and nerve pain as well as progressive LE weakness.  Time to administer test: 4 minutes (explanation, preparation reps, and test) Analysis: patient scored significantly worse than the 7.61.8 sec that is the norm for healthy males of his age. He  also required heavy UE support while test norms assume UE crossed over chest. This demonstrates a significantly worse LE strength and power than would be expected for his age group and is actually worse than the norm for 63-83 year old community dwelling adults, which is 10.6 + 3.4 seconds. His score is 4 seconds worse than was recorded last on 06/03/2023 which was 15 seconds with similar UE support. His score was better than his baseline of 23 seconds at the start of current episode of care.    TODAY'S TREATMENT:    Manual therapy: to reduce pain and tissue tension, improve range of motion, neuromodulation, in order to promote improved ability to complete functional activities. Hooklying R hip caudal/posterior glide with belt, 4x30-45 seconds, grade IV  Physical Performance Test or Measurement: a physical performance test or measurement with written report.  6 Minute Walk Test and 5 Times Sit To Stand Test (see above for written reports)  Therapeutic exercise: to centralize symptoms and improve ROM, strength, muscular endurance, and activity tolerance required for successful completion of functional activities.   Superset/Circuit:  Seated overhead pull down (B shoulder extension from ~120 degrees flexion to bar on thighs) with cable bar  1x20 at 25# 1x5 at 25# (discontinued due to increasing pain) 1x15 at 20#  Seated CHEST press with  1x15 with 23# Barbell  Seated lumbar flexion roll out with clear theraball to decrease pain after irritating activities (6 Minute Walk Test and seated overhead pull down or chest press).  Two sets of self-selected time for each until pain calmed down and he was ready to do next session.   Pt required multimodal cuing for proper technique and to facilitate improved neuromuscular control, strength, range of motion, and functional ability resulting in improved performance and form.   PATIENT EDUCATION:  Education details: Exercise purpose/form. Self management  techniques.  Person educated: patient Education method: Explanation Education comprehension: verbalized understanding and needs further education  HOME EXERCISE PROGRAM: Verbally:  -  seated lumbar flexion stretch with head down and one LE extended using rollator, 1x10 each side.  - curl up with legs elevated, 5x15 seconds - sidelying open book 1x10 each side  Access Code: [email or web address] URL: [email or web address] Date: 06/24/2023 Prepared by: Norton Blizzard  Exercises - Median Nerve Flossing - Tray  - 3 x daily - 10 reps - Seated Cervical Retraction  - 1-2 x daily - 3 sets - 10 reps  ASSESSMENT:  CLINICAL IMPRESSION:     Patient has attended 50 physical therapy sessions since starting current episode of care on 12/26/2022. He continues to struggle with tolerating PT interventions and with ambulatory function. He does get pain relief with manual distraction to the right hip. He was able to ambulate further during the 6 Minute Walk Test today than at last progress note but his 5 Times Sit To Stand test was about 4 seconds slower. He has been gaining core strength and activity tolerance to seated loading on the sagittal plane, but continues to have episodes of increased pain and weakness with decreased function. His physician continues to recommend PT and he will experience functional decline without continued PT. He has a chronic degenerative neurologic condition that will worsen without continued PT. Plan to continue working towards goals and maximizing functional mobility and quality of life. New goal added for Patient Specific Functional Scale to make functional outcome measure more personalized to patient and due to expected expiration of FOTO score access in the next two months.  Patient would benefit from continued management of limiting condition by skilled physical therapist to address remaining impairments and functional limitations to work towards stated goals and return to PLOF or  maximal functional independence.   OBJECTIVE IMPAIRMENTS: Abnormal gait, decreased activity tolerance, decreased balance, decreased coordination, decreased endurance, decreased knowledge of condition, decreased knowledge of use of DME, decreased mobility, difficulty walking, decreased ROM, decreased strength, increased edema, impaired perceived functional ability, increased muscle spasms, impaired flexibility, impaired sensation, impaired tone, improper body mechanics, postural dysfunction, obesity, and pain.   ACTIVITY LIMITATIONS: carrying, lifting, bending, standing, squatting, stairs, transfers, bed mobility, continence, bathing, toileting, dressing, hygiene/grooming, locomotion level, and caring for others  PARTICIPATION LIMITATIONS: meal prep, cleaning, laundry, interpersonal relationship, driving, shopping, community activity, occupation, yard work, and   difficulty with or unable to complete any activity that requires weight bearing, use of B LE, and/or balance including working, household and community mobility, walking, driving, going to family gatherings, avoiding incontinent episodes, playing with daughter, helping around the house, bed mobility, transfers  PERSONAL FACTORS: Past/current experiences, Time since onset of injury/illness/exacerbation, and 3+ comorbidities:   3 thoracic spine surgeries, thoracic MRI notes "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 Franky Macho on 05/25/2022, adhesive arachnoiditis, nerve pain are also affecting patient's functional outcome.   REHAB POTENTIAL: Fair due to severity and nature of condition.   CLINICAL DECISION MAKING: Evolving/moderate  complexity  EVALUATION COMPLEXITY: Moderate   GOALS: Goals reviewed with patient? No  SHORT TERM GOALS: Target date: 01/09/2023. Target date updated to 05/28/2023 for all unmet goals on 03/05/2023.   Patient will be independent with initial home exercise program for self-management of symptoms. Baseline: Initial HEP to be provided at visit 2 as appropriate (12/26/22); Goal  status: MET   LONG TERM GOALS: Target date: 03/20/2023. Target date updated to 08/26/23  for all unmet goals on 06/03/2023. Target date updated to 11/20/2023 for all  unmet goals on 08/28/2023.   Patient will be independent with a long-term home exercise program for self-management of symptoms.  Baseline: Initial HEP to be provided at visit 2 as appropriate (12/26/22); participating as able (01/31/2023); patient currently participating as tolerated (03/05/2023; 04/09/2023; 06/03/2023); participates daily in mat exercises and in weight bearing exercises as tolerated (10/08/2023);  Goal status: In-progress  2.  Patient will demonstrate improved FOTO by equal or greater than 10 points by visit #13 to demonstrate improvement in overall condition and self-reported functional ability.  Baseline: to be tested visit 2 as appropriate (12/26/22); 30 at visit #3 (01/02/2023); 46 at visit #13 (03/05/2023); 45 at visit #29 (06/03/2023); 49 at visit #50 (10/08/2023);  Goal status: MET  3.  Patient will demonstrate the ability to ambulate equal or greater than 600 feet with LRAD during the 6 minute walk to improve his household and community mobility.  Baseline: 34 feet with bari-RW (12/26/22); 200 feet with bari-RW (01/31/2023); 500 feet with BRW (03/05/2023); 400 feet with BRW (04/09/2023); 152 feet with BRW and W/C follow for safety (06/03/2023); 330 feet with BRW (10/08/2023);  Goal status: Ongoing  4.  Patient will complete 5 Time Sit To Stand Test from 19.5 inch surface or lower in equal or less than 15 seconds with no UE support to demonstrate  improved transfer ability for improved household and toileting mobility.  Baseline: 23 seconds with heavy B UE support on RW from 19.5 inch plinth. Pain throbbing down the right LE.  (12/26/22); 19 seconds with heavy B UE support on RW from 19.5 inch plinth (03/05/2023); 20 seconds with heavy B UE support on RRW from 18.5 inch plinth. Painful in back of B calves, thighs, and lower back (04/09/2023); 15 seconds with heavy B UE support on BRW from 18.5 inch plinth. Painful in back of B calves, thighs, and lower back (06/03/2023); 19 seconds with heavy B UE support on BRW from 18.5 inch plinth. Painful in back of B calves, thighs, and lower back (10/08/2023);  Goal status: Ongoing  5.  Patient will report worst pain equal or less than 3/10 with functional activities to improve his ability to complete basic household and community mobility. Baseline: up to 8/10 (12/26/22); reports 3/10 pain (01/31/2023); reports 4/10 pain (02/19/2023); up to 7/10 in the last 2 week s(03/05/2023): up to  7/10 in the last two weeks (04/09/2023); up to 8/10 in the last 2 weeks (06/03/2023);  up to 9/10 over the last 2 weeks (10/08/2023);  Goal status: Ongoing  6.  Patient will demonstrate improvement in Patient Specific Functional Scale (PSFS) by equal or greater than 3/10 points to reflect clinically significant improvement in patient's most valued functional activities. Baseline: 1.7/10 for Walking Around [address], [place], and Driving ([birth year]); Goal status: NEW    PLAN:  PT FREQUENCY: 1-2x/week  PT DURATION: 12 weeks  PLANNED INTERVENTIONS: Therapeutic exercises, Therapeutic activity, Neuromuscular re-education, Balance training, Gait training, Patient/Family education, Self Care, Joint mobilization, Stair training, Orthotic/Fit training, DME instructions, Aquatic Therapy, Dry Needling, Electrical stimulation, Wheelchair mobility training, Spinal mobilization, Cryotherapy, Moist heat, Manual therapy, and  Re-evaluation.  PLAN FOR NEXT SESSION:  update HEP as appropriate, neurodynamics, gait training, LE/core/functional strengthening, stretching, and balance as tolerated, education, manual therapy as needed.   Luretha Murphy. Ilsa Iha, PT, DPT 10/08/23, 6:57 PM  [facility] [address] [place], [place] [address] P: [phone] I F: [phone]

## 2023-10-10 ENCOUNTER — Encounter: Payer: Self-pay | Admitting: Physical Therapy

## 2023-10-10 ENCOUNTER — Ambulatory Visit: Payer: Medicare Other | Admitting: Physical Therapy

## 2023-10-10 DIAGNOSIS — R29818 Other symptoms and signs involving the nervous system: Secondary | ICD-10-CM

## 2023-10-10 DIAGNOSIS — M5459 Other low back pain: Secondary | ICD-10-CM

## 2023-10-10 DIAGNOSIS — M5416 Radiculopathy, lumbar region: Secondary | ICD-10-CM

## 2023-10-10 DIAGNOSIS — M6281 Muscle weakness (generalized): Secondary | ICD-10-CM

## 2023-10-10 DIAGNOSIS — R296 Repeated falls: Secondary | ICD-10-CM

## 2023-10-10 DIAGNOSIS — R262 Difficulty in walking, not elsewhere classified: Secondary | ICD-10-CM

## 2023-10-10 NOTE — Therapy (Signed)
 OUTPATIENT PHYSICAL THERAPY TREATMENT   Patient Name: Travis Fitzpatrick MRN: [id number] DOB:02-06-1981, 43 y.o., male Today's Date: 10/10/23   END OF SESSION:  PT End of Session - 10/10/23 1618     Visit Number 51    Number of Visits 63    Date for PT Re-Evaluation 11/20/23    Authorization Type MEDICARE PART B reporting period from 10/08/2023    Progress Note Due on Visit 50    PT Start Time 1603    PT Stop Time 1647    PT Time Calculation (min) 44 min    Activity Tolerance Patient limited by pain    Behavior During Therapy [facility] for tasks assessed/performed             Past Medical History:  Diagnosis Date   Bowel trouble    urgency   Medical history non-contributory    Urinary urgency    Past Surgical History:  Procedure Laterality Date   BACK SURGERY  2010   CIRCUMCISION     LUMBAR LAMINECTOMY/DECOMPRESSION MICRODISCECTOMY  07/20/2011   Procedure: LUMBAR LAMINECTOMY/DECOMPRESSION MICRODISCECTOMY;  Surgeon: Carmela Hurt;  Location: [facility];  Service: Neurosurgery;  Laterality: N/A;  right thoracotomy with thoracic eight-nine discectomy and fusion   LUMBAR LAMINECTOMY/DECOMPRESSION MICRODISCECTOMY Right 10/06/2021   Procedure: Right Lumbar Four-Five Microdiscectomy, Right Lumbar Five-Sacal One Foraminotomy;  Surgeon: Coletta Memos, MD;  Location: [facility];  Service: Neurosurgery;  Laterality: Right;  3C/RM 21   THORACIC DISCECTOMY  07/16/2012   Procedure: THORACIC DISCECTOMY;  Surgeon: Carmela Hurt, MD;  Location: [facility];  Service: Neurosurgery;  Laterality: Right;  RIGHT Thoracic seven-eight  thoracic diskectomy via thoracotomy by dr Laneta Simmers   THORACIC DISCECTOMY N/A 12/15/2014   Procedure: THORACIC SEVEN TO THORACIC NINE Laminectomy ;  Surgeon: Coletta Memos, MD;  Location: [facility];  Service: Neurosurgery;  Laterality: N/A;   THORACIC DISCECTOMY N/A 06/06/2017   Procedure: LAMINECTOMY THORACIC NINE-TEN;  Surgeon: Coletta Memos, MD;  Location: [facility];  Service:  Neurosurgery;  Laterality: N/A;  LAMINECTOMY THORACIC NINE-TEN   THORACOTOMY  07/20/2011   Procedure: THORACOTOMY OPEN FOR SPINE SURGERY;  Surgeon: Norton Blizzard, MD;  Location: [facility];  Service: Vascular;  Laterality: N/A;   THORACOTOMY  07/16/2012   Procedure: THORACOTOMY OPEN FOR SPINE SURGERY;  Surgeon: Alleen Borne, MD;  Location: [facility];  Service: Thoracic;  Laterality: N/A;   Patient Active Problem List   Diagnosis Date Noted   Spasticity 09/25/2023   Nerve pain 12/05/2022   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   S/P lumbar spinal fusion 05/25/2022   Synovial cyst of lumbar facet joint 05/25/2022   HNP (herniated nucleus pulposus), lumbar 10/06/2021   Hand pain 06/28/2021   Lumbar facet arthropathy 01/11/2021   Thoracic spondylosis with myelopathy 01/11/2021   Thoracic spinal stenosis 06/06/2017   Stenosis, spinal, thoracic 12/15/2014   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 09/22/2014   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/2011    PCP: Nira Retort  REFERRING PROVIDER: Ranelle Oyster, MD  REFERRING DIAG: thoracic disc disease with myelopathy, nerve pain, adhesive arachnoiditis  Rationale for Evaluation and Treatment: Rehabilitation  THERAPY DIAG:  Other low back pain  Other symptoms and signs involving the nervous system  Radiculopathy, lumbar region  Muscle weakness (generalized)  Repeated falls  Difficulty in walking, not elsewhere classified  ONSET DATE:  Suddenly started having weakness 11 years ago, most recent episode of worsening October 2022, s/p right L4-5  laminotomy, microdiscectomy on 10/06/2021, now s/p L4-5 PLIF by Dr. Franky Macho on 05/25/2022.  PERTINENT HISTORY:  Patient is a 43 y.o. male who presents to outpatient physical therapy with a referral for medical diagnosis thoracic disc disease with myelopathy, nerve pain, adhesive arachnoiditis. This patient's chief complaints consist of disabling  low back and R >L leg pain and weakness with activity, bowel and bladder urgency, incontinence, and retention, leading to the following functional deficits: difficulty with or unable to complete any activity that requires weight bearing, use of B LE, and/or balance including working, household and community mobility, walking, driving, going to family gatherings, avoiding incontinent episodes, playing with daughter, helping around the house, bed mobility, transfers. 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 Franky Macho on 05/25/2022, adhesive arachnoiditis, nerve pain.  Patient denies hx of cancer, stroke, seizures, lung problems, heart problems, diabetes, unexplained weight loss, and osteoporosis.  SUBJECTIVE:                                                                                                                                                                                           SUBJECTIVE STATEMENT:     Patient states he is doing okay today and actually today is a good day. He states he felt pain in normal places after last PT session with good soreness.   PAIN:  NPRS: 3/10 mostly in low back, right groin/hip, bilateral hamstrings, right calf  PRECAUTIONS: Fall  PATIENT GOALS: "To get better, walk, drive again, climb stairs, play with my daughter, help around the house again"  NEXT MD VISIT:   OBJECTIVE    TODAY'S TREATMENT:    Manual therapy: to reduce pain and tissue tension, improve range of motion, neuromodulation, in order to promote improved ability to complete  functional activities. Hooklying R hip caudal/posterior glide with belt, 4x30-45 seconds, grade IV  Therapeutic exercise: to centralize symptoms and improve ROM, strength, muscular endurance, and activity tolerance required for successful completion of functional activities.   Superset/Circuit:  Seated overhead pull down (B shoulder extension from ~120 degrees flexion to bar on thighs) with cable bar  2x20 at 25# Unable to tolerate 3rd set at this weight 1x20 at 20# Cuing for increased TrA contraction  Seated CHEST press with  3x15 with 23# Barbell  Seated  lumbar flexion roll out with clear theraball to decrease pain after irritating activities.  1x50 seconds 1x2 min  Seated lateral ball shifts to challenge core 3x10 each side with 15# slam ball 1x15 each side with 15# slam ball   Therapeutic activities: dynamic therapeutic activities incorporating MULTIPLE parameters or areas of the body designed to achieve improved functional performance.  TRX sit <> stand from 17 inch grey chair 2x5 with effort to maintain position of spine that does not provoke symptoms.  CGA and assistance with positioning and stabilizing chair and TRX  Blaze pods (to simulate stepping and weight bearing tolerance needed for gait):  Standing toe taps on 4 pods (one lateral to each foot on same level and one in front of each foot and elevated on airex pad) with BU support on treadmill. Random with 2 lights as distraction. 4x30 seconds 11 hits 16 hits 15 hits 17 hits 30 second seated break between sets  Pt required multimodal cuing for proper technique and to facilitate improved neuromuscular control, strength, range of motion, and functional ability resulting in improved performance and form.   PATIENT EDUCATION:  Education details: Exercise purpose/form. Self management techniques.  Person educated: patient Education method: Explanation Education comprehension: verbalized understanding and needs  further education  HOME EXERCISE PROGRAM: Verbally:  - seated lumbar flexion stretch with head down and one LE extended using rollator, 1x10 each side.  - curl up with legs elevated, 5x15 seconds - sidelying open book 1x10 each side  Access Code: [email or web address] URL: [email or web address] Date: 06/24/2023 Prepared by: Norton Blizzard  Exercises - Median Nerve Flossing - Tray  - 3 x daily - 10 reps - Seated Cervical Retraction  - 1-2 x daily - 3 sets - 10 reps  ASSESSMENT:  CLINICAL IMPRESSION:     Patient arrives having a "good day." He was able to complete more interventions with less rest today. Again attempted more standing and squatting exercises to help improve LE functional mobility. Patient tolerated well overall but was still limited by pain. Also introduced lateral ball shifts to re-attempt engagement of core in the frontal and horizontal plane which he has had difficulty tolerating in the past. Also continued with R hip mobilization with continued pain relief following. Plan to continue with exercises to improve functional activity limitations as tolerated. Patient would benefit from continued management of limiting condition by skilled physical therapist to address remaining impairments and functional limitations to work towards stated goals and return to PLOF or maximal functional independence.    OBJECTIVE IMPAIRMENTS: Abnormal gait, decreased activity tolerance, decreased balance, decreased coordination, decreased endurance, decreased knowledge of condition, decreased knowledge of use of DME, decreased mobility, difficulty walking, decreased ROM, decreased strength, increased edema, impaired perceived functional ability, increased muscle spasms, impaired flexibility, impaired sensation, impaired tone, improper body mechanics, postural dysfunction, obesity, and pain.   ACTIVITY LIMITATIONS: carrying, lifting, bending, standing, squatting, stairs, transfers, bed mobility,  continence, bathing, toileting, dressing, hygiene/grooming, locomotion level, and caring for others  PARTICIPATION LIMITATIONS: meal prep, cleaning, laundry, interpersonal relationship, driving, shopping, community activity, occupation, yard work, and   difficulty with or unable to complete any activity that requires weight bearing, use of B LE, and/or balance including working, household and community mobility, walking, driving, going to family gatherings, avoiding incontinent episodes, playing with daughter, helping around the house, bed mobility, transfers  PERSONAL FACTORS: Past/current experiences, Time since onset of injury/illness/exacerbation, and 3+ comorbidities:   3 thoracic spine surgeries, thoracic MRI notes "Myelomalacia with severe cord atrophy  from T7 through T9-10 and mild decreased volume the remainder of the thoracic cord, stable" s/p right L4-5 laminotomy, microdiscectomy on 10/06/2021, 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 Franky Macho on 05/25/2022, adhesive arachnoiditis, nerve pain are also affecting patient's functional outcome.   REHAB POTENTIAL: Fair due to severity and nature of condition.   CLINICAL DECISION MAKING: Evolving/moderate complexity  EVALUATION COMPLEXITY: Moderate   GOALS: Goals reviewed with patient? No  SHORT TERM GOALS: Target date: 01/09/2023. Target date updated to 05/28/2023 for all unmet goals on 03/05/2023.   Patient will be independent with initial home exercise program for self-management of symptoms. Baseline: Initial HEP to be provided at visit 2 as appropriate (12/26/22); Goal status: MET   LONG TERM GOALS: Target date: 03/20/2023. Target date updated to 08/26/23  for all unmet goals on 06/03/2023. Target date updated to 11/20/2023 for all  unmet goals on 08/28/2023.   Patient will be independent with a long-term  home exercise program for self-management of symptoms.  Baseline: Initial HEP to be provided at visit 2 as appropriate (12/26/22); participating as able (01/31/2023); patient currently participating as tolerated (03/05/2023; 04/09/2023; 06/03/2023); participates daily in mat exercises and in weight bearing exercises as tolerated (10/08/2023);  Goal status: In-progress  2.  Patient will demonstrate improved FOTO by equal or greater than 10 points by visit #13 to demonstrate improvement in overall condition and self-reported functional ability.  Baseline: to be tested visit 2 as appropriate (12/26/22); 30 at visit #3 (01/02/2023); 46 at visit #13 (03/05/2023); 45 at visit #29 (06/03/2023); 49 at visit #50 (10/08/2023);  Goal status: MET  3.  Patient will demonstrate the ability to ambulate equal or greater than 600 feet with LRAD during the 6 minute walk to improve his household and community mobility.  Baseline: 34 feet with bari-RW (12/26/22); 200 feet with bari-RW (01/31/2023); 500 feet with BRW (03/05/2023); 400 feet with BRW (04/09/2023); 152 feet with BRW and W/C follow for safety (06/03/2023); 330 feet with BRW (10/08/2023);  Goal status: Ongoing  4.  Patient will complete 5 Time Sit To Stand Test from 19.5 inch surface or lower in equal or less than 15 seconds with no UE support to demonstrate improved transfer ability for improved household and toileting mobility.  Baseline: 23 seconds with heavy B UE support on RW from 19.5 inch plinth. Pain throbbing down the right LE.  (12/26/22); 19 seconds with heavy B UE support on RW from 19.5 inch plinth (03/05/2023); 20 seconds with heavy B UE support on RRW from 18.5 inch plinth. Painful in back of B calves, thighs, and lower back (04/09/2023); 15 seconds with heavy B UE support on BRW from 18.5 inch plinth. Painful in back of B calves, thighs, and lower back (06/03/2023); 19 seconds with heavy B UE support on BRW from 18.5 inch plinth. Painful in back of B calves,  thighs, and lower back (10/08/2023);  Goal status: Ongoing  5.  Patient will report worst pain equal or less than 3/10 with functional activities to improve his ability to complete basic household and community mobility. Baseline: up to 8/10 (12/26/22); reports 3/10 pain (01/31/2023); reports 4/10 pain (02/19/2023); up to 7/10 in the last 2 week s(03/05/2023): up to  7/10 in the last two weeks (04/09/2023); up to 8/10 in the last 2 weeks (06/03/2023);  up to 9/10 over the last 2 weeks (10/08/2023);  Goal status:  Ongoing  6.  Patient will demonstrate improvement in Patient Specific Functional Scale (PSFS) by equal or greater than 3/10 points to reflect clinically significant improvement in patient's most valued functional activities. Baseline: 1.7/10 for Walking Around [address], [place], and Driving (78/29/[birth year]); Goal status: NEW    PLAN:  PT FREQUENCY: 1-2x/week  PT DURATION: 12 weeks  PLANNED INTERVENTIONS: Therapeutic exercises, Therapeutic activity, Neuromuscular re-education, Balance training, Gait training, Patient/Family education, Self Care, Joint mobilization, Stair training, Orthotic/Fit training, DME instructions, Aquatic Therapy, Dry Needling, Electrical stimulation, Wheelchair mobility training, Spinal mobilization, Cryotherapy, Moist heat, Manual therapy, and Re-evaluation.  PLAN FOR NEXT SESSION:  update HEP as appropriate, neurodynamics, gait training, LE/core/functional strengthening, stretching, and balance as tolerated, education, manual therapy as needed.   Luretha Murphy. Ilsa Iha, PT, DPT 10/10/23, 8:33 PM  [facility] [address] [place], [place] [address] P: [phone] I F: [phone]

## 2023-10-15 ENCOUNTER — Ambulatory Visit: Payer: Medicare Other | Attending: Physical Medicine & Rehabilitation | Admitting: Physical Therapy

## 2023-10-15 ENCOUNTER — Encounter: Payer: Self-pay | Admitting: Physical Therapy

## 2023-10-15 DIAGNOSIS — M5416 Radiculopathy, lumbar region: Secondary | ICD-10-CM | POA: Insufficient documentation

## 2023-10-15 DIAGNOSIS — R262 Difficulty in walking, not elsewhere classified: Secondary | ICD-10-CM | POA: Insufficient documentation

## 2023-10-15 DIAGNOSIS — M6281 Muscle weakness (generalized): Secondary | ICD-10-CM | POA: Diagnosis present

## 2023-10-15 DIAGNOSIS — M5459 Other low back pain: Secondary | ICD-10-CM | POA: Diagnosis present

## 2023-10-15 DIAGNOSIS — R296 Repeated falls: Secondary | ICD-10-CM | POA: Insufficient documentation

## 2023-10-15 DIAGNOSIS — R29818 Other symptoms and signs involving the nervous system: Secondary | ICD-10-CM | POA: Insufficient documentation

## 2023-10-15 NOTE — Therapy (Unsigned)
 OUTPATIENT PHYSICAL THERAPY TREATMENT   Patient Name: Travis Fitzpatrick MRN: [id number] DOB:January 27, 1981, 43 y.o., male Today's Date: 10/15/23   END OF SESSION:  PT End of Session - 10/15/23 1916     Visit Number 52    Number of Visits 63    Date for PT Re-Evaluation 11/20/23    Authorization Type MEDICARE PART B reporting period from 10/08/2023    Progress Note Due on Visit 50    PT Start Time 1614    PT Stop Time 1645    PT Time Calculation (min) 31 min    Activity Tolerance Patient limited by pain;Patient tolerated treatment well    Behavior During Therapy [facility] for tasks assessed/performed              Past Medical History:  Diagnosis Date   Bowel trouble    urgency   Medical history non-contributory    Urinary urgency    Past Surgical History:  Procedure Laterality Date   BACK SURGERY  2010   CIRCUMCISION     LUMBAR LAMINECTOMY/DECOMPRESSION MICRODISCECTOMY  07/20/2011   Procedure: LUMBAR LAMINECTOMY/DECOMPRESSION MICRODISCECTOMY;  Surgeon: Carmela Hurt;  Location: [facility];  Service: Neurosurgery;  Laterality: N/A;  right thoracotomy with thoracic eight-nine discectomy and fusion   LUMBAR LAMINECTOMY/DECOMPRESSION MICRODISCECTOMY Right 10/06/2021   Procedure: Right Lumbar Four-Five Microdiscectomy, Right Lumbar Five-Sacal One Foraminotomy;  Surgeon: Coletta Memos, MD;  Location: [facility];  Service: Neurosurgery;  Laterality: Right;  3C/RM 21   THORACIC DISCECTOMY  07/16/2012   Procedure: THORACIC DISCECTOMY;  Surgeon: Carmela Hurt, MD;  Location: [facility];  Service: Neurosurgery;  Laterality: Right;  RIGHT Thoracic seven-eight  thoracic diskectomy via thoracotomy by dr Laneta Simmers   THORACIC DISCECTOMY N/A 12/15/2014   Procedure: THORACIC SEVEN TO THORACIC NINE Laminectomy ;  Surgeon: Coletta Memos, MD;  Location: [facility];  Service: Neurosurgery;  Laterality: N/A;   THORACIC DISCECTOMY N/A 06/06/2017   Procedure: LAMINECTOMY THORACIC NINE-TEN;  Surgeon: Coletta Memos, MD;  Location: [facility];  Service: Neurosurgery;  Laterality: N/A;  LAMINECTOMY THORACIC NINE-TEN   THORACOTOMY  07/20/2011   Procedure: THORACOTOMY OPEN FOR SPINE SURGERY;  Surgeon: Norton Blizzard, MD;  Location: [facility];  Service: Vascular;  Laterality: N/A;   THORACOTOMY  07/16/2012   Procedure: THORACOTOMY OPEN FOR SPINE SURGERY;  Surgeon: Alleen Borne, MD;  Location: [facility];  Service: Thoracic;  Laterality: N/A;   Patient Active Problem List   Diagnosis Date Noted   Spasticity 09/25/2023   Nerve pain 12/05/2022   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   S/P lumbar spinal fusion 05/25/2022   Synovial cyst of lumbar facet joint 05/25/2022   HNP (herniated nucleus pulposus), lumbar 10/06/2021   Hand pain 06/28/2021   Lumbar facet arthropathy 01/11/2021   Thoracic spondylosis with myelopathy 01/11/2021   Thoracic spinal stenosis 06/06/2017   Stenosis, spinal, thoracic 12/15/2014   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 09/22/2014   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/2011    PCP: Nira Retort  REFERRING PROVIDER: Ranelle Oyster, MD  REFERRING DIAG: thoracic disc disease with myelopathy, nerve pain, adhesive arachnoiditis  Rationale for Evaluation and Treatment: Rehabilitation  THERAPY DIAG:  Other low back pain  Other symptoms and signs involving the nervous system  Radiculopathy, lumbar region  Muscle weakness (generalized)  Repeated falls  Difficulty in walking, not elsewhere classified  ONSET DATE:  Suddenly started having weakness 11 years ago, most recent episode of worsening October  2022, s/p right L4-5 laminotomy, microdiscectomy on 10/06/2021, now s/p L4-5 PLIF by Dr. Franky Macho on 05/25/2022.  PERTINENT HISTORY:  Patient is a 43 y.o. male who presents to outpatient physical therapy with a referral for medical diagnosis thoracic disc disease with myelopathy, nerve pain, adhesive arachnoiditis. This patient's  chief complaints consist of disabling low back and R >L leg pain and weakness with activity, bowel and bladder urgency, incontinence, and retention, leading to the following functional deficits: difficulty with or unable to complete any activity that requires weight bearing, use of B LE, and/or balance including working, household and community mobility, walking, driving, going to family gatherings, avoiding incontinent episodes, playing with daughter, helping around the house, bed mobility, transfers. 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 Franky Macho on 05/25/2022, adhesive arachnoiditis, nerve pain.  Patient denies hx of cancer, stroke, seizures, lung problems, heart problems, diabetes, unexplained weight loss, and osteoporosis.  SUBJECTIVE:                                                                                                                                                                                           SUBJECTIVE STATEMENT:     Patient states his body hurts today but it is okay. He states he was in pain after last PT session but not to where it put him down all weekend. He states he did surprisingly good after last PT session. He is dependent on his sister in law getting off work in time to pick up his nieces and nephews his wife is watching to be able to get here in time.   PAIN:  NPRS: 4/10 mostly in low back, right groin/hip, bilateral hamstrings, right calf  PRECAUTIONS: Fall  PATIENT GOALS: "To get better, walk, drive again, climb stairs, play with my  daughter, help around the house again"  NEXT MD VISIT:   OBJECTIVE    TODAY'S TREATMENT:    Manual therapy: to reduce pain and tissue tension, improve range of motion, neuromodulation, in order to promote improved ability to complete functional activities. Hooklying R hip caudal/posterior glide with belt, 4x45 seconds, grade IV  Therapeutic exercise: to centralize symptoms and improve ROM, strength, muscular endurance, and activity tolerance required for successful completion of functional activities.   Superset:  Seated overhead pull  down (B shoulder extension from ~120 degrees flexion to bar on thighs) with cable bar  2x20 at 25# Unable to tolerate 3rd set at this weight 1x20 at 20#  Seated CHEST press with  3x15 with 23# Barbell  Seated lumbar flexion roll out with clear theraball to decrease pain after irritating activities.  2x duration to patient preference  Therapeutic activities: dynamic therapeutic activities incorporating MULTIPLE parameters or areas of the body designed to achieve improved functional performance.  TRX sit <> stand from 17 inch grey chair 3x5 with effort to maintain position of spine that does not provoke symptoms.  At least 1 min rest between each set First set with a lot of failed attempts, pt fearful of falling CGA-min A and assistance with positioning and stabilizing chair and TRX  Pt required multimodal cuing for proper technique and to facilitate improved neuromuscular control, strength, range of motion, and functional ability resulting in improved performance and form.   PATIENT EDUCATION:  Education details: Exercise purpose/form. Self management techniques.  Person educated: patient Education method: Explanation Education comprehension: verbalized understanding and needs further education  HOME EXERCISE PROGRAM: Verbally:  - seated lumbar flexion stretch with head down and one LE extended using rollator, 1x10 each side.  - curl up with  legs elevated, 5x15 seconds - sidelying open book 1x10 each side  Access Code: [email or web address] URL: [email or web address] Date: 06/24/2023 Prepared by: Norton Blizzard  Exercises - Median Nerve Flossing - Tray  - 3 x daily - 10 reps - Seated Cervical Retraction  - 1-2 x daily - 3 sets - 10 reps  ASSESSMENT:  CLINICAL IMPRESSION:     Patient arrives with acceptable tolerance to last PT session. Continued with joint mobs to improve R hip pain and continued with core and functional strengthening as tolerated. Patient arrived late due to transportation issues and session was shorter. He was able to complete more sit <> stand rests today. Patient would benefit from continued management of limiting condition by skilled physical therapist to address remaining impairments and functional limitations to work towards stated goals and return to PLOF or maximal functional independence.   OBJECTIVE IMPAIRMENTS: Abnormal gait, decreased activity tolerance, decreased balance, decreased coordination, decreased endurance, decreased knowledge of condition, decreased knowledge of use of DME, decreased mobility, difficulty walking, decreased ROM, decreased strength, increased edema, impaired perceived functional ability, increased muscle spasms, impaired flexibility, impaired sensation, impaired tone, improper body mechanics, postural dysfunction, obesity, and pain.   ACTIVITY LIMITATIONS: carrying, lifting, bending, standing, squatting, stairs, transfers, bed mobility, continence, bathing, toileting, dressing, hygiene/grooming, locomotion level, and caring for others  PARTICIPATION LIMITATIONS: meal prep, cleaning, laundry, interpersonal relationship, driving, shopping, community activity, occupation, yard work, and   difficulty with or unable to complete any activity that requires weight bearing, use of B LE, and/or balance including working, household and community mobility, walking, driving, going to family  gatherings, avoiding incontinent episodes, playing with daughter, helping around the house, bed mobility, transfers  PERSONAL FACTORS: Past/current experiences, Time since onset of injury/illness/exacerbation, and 3+ comorbidities:   3 thoracic spine surgeries, thoracic MRI notes "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 Franky Macho on 05/25/2022, adhesive arachnoiditis, nerve pain are also affecting patient's functional outcome.   REHAB POTENTIAL:  Fair due to severity and nature of condition.   CLINICAL DECISION MAKING: Evolving/moderate complexity  EVALUATION COMPLEXITY: Moderate   GOALS: Goals reviewed with patient? No  SHORT TERM GOALS: Target date: 01/09/2023. Target date updated to 05/28/2023 for all unmet goals on 03/05/2023.   Patient will be independent with initial home exercise program for self-management of symptoms. Baseline: Initial HEP to be provided at visit 2 as appropriate (12/26/22); Goal status: MET   LONG TERM GOALS: Target date: 03/20/2023. Target date updated to 08/26/23  for all unmet goals on 06/03/2023. Target date updated to 11/20/2023 for all  unmet goals on 08/28/2023.   Patient will be independent with a long-term home exercise program for self-management of symptoms.  Baseline: Initial HEP to be provided at visit 2 as appropriate (12/26/22); participating as able (01/31/2023); patient currently participating as tolerated (03/05/2023; 04/09/2023; 06/03/2023); participates daily in mat exercises and in weight bearing exercises as tolerated (10/08/2023);  Goal status: In-progress  2.  Patient will demonstrate improved FOTO by equal or greater than 10 points by  visit #13 to demonstrate improvement in overall condition and self-reported functional ability.  Baseline: to be tested visit 2 as appropriate (12/26/22); 30 at visit #3 (01/02/2023); 46 at visit #13 (03/05/2023); 45 at visit #29 (06/03/2023); 49 at visit #50 (10/08/2023);  Goal status: MET  3.  Patient will demonstrate the ability to ambulate equal or greater than 600 feet with LRAD during the 6 minute walk to improve his household and community mobility.  Baseline: 34 feet with bari-RW (12/26/22); 200 feet with bari-RW (01/31/2023); 500 feet with BRW (03/05/2023); 400 feet with BRW (04/09/2023); 152 feet with BRW and W/C follow for safety (06/03/2023); 330 feet with BRW (10/08/2023);  Goal status: Ongoing  4.  Patient will complete 5 Time Sit To Stand Test from 19.5 inch surface or lower in equal or less than 15 seconds with no UE support to demonstrate improved transfer ability for improved household and toileting mobility.  Baseline: 23 seconds with heavy B UE support on RW from 19.5 inch plinth. Pain throbbing down the right LE.  (12/26/22); 19 seconds with heavy B UE support on RW from 19.5 inch plinth (03/05/2023); 20 seconds with heavy B UE support on RRW from 18.5 inch plinth. Painful in back of B calves, thighs, and lower back (04/09/2023); 15 seconds with heavy B UE support on BRW from 18.5 inch plinth. Painful in back of B calves, thighs, and lower back (06/03/2023); 19 seconds with heavy B UE support on BRW from 18.5 inch plinth. Painful in back of B calves, thighs, and lower back (10/08/2023);  Goal status: Ongoing  5.  Patient will report worst pain equal or less than 3/10 with functional activities to improve his ability to complete basic household and community mobility. Baseline: up to 8/10 (12/26/22); reports 3/10 pain (01/31/2023); reports 4/10 pain (02/19/2023); up to 7/10 in the last 2 week s(03/05/2023): up to  7/10 in the last two weeks (04/09/2023); up to 8/10 in the last 2 weeks (06/03/2023);   up to 9/10 over the last 2 weeks (10/08/2023);  Goal status: Ongoing  6.  Patient will demonstrate improvement in Patient Specific Functional Scale (PSFS) by equal or greater than 3/10 points to reflect clinically significant improvement in patient's most valued functional activities. Baseline: 1.7/10 for Walking Around [address], [place], and Driving (09/81/19); Goal status: NEW    PLAN:  PT FREQUENCY: 1-2x/week  PT DURATION: 12 weeks  PLANNED INTERVENTIONS: Therapeutic exercises, Therapeutic activity,  Neuromuscular re-education, Balance training, Gait training, Patient/Family education, Self Care, Joint mobilization, Stair training, Orthotic/Fit training, DME instructions, Aquatic Therapy, Dry Needling, Electrical stimulation, Wheelchair mobility training, Spinal mobilization, Cryotherapy, Moist heat, Manual therapy, and Re-evaluation.  PLAN FOR NEXT SESSION:  update HEP as appropriate, neurodynamics, gait training, LE/core/functional strengthening, stretching, and balance as tolerated, education, manual therapy as needed.   Luretha Murphy. Ilsa Iha, PT, DPT 10/15/23, 8:45 PM  [facility] [address] [place], [place] [address] P: [phone] I F: [phone]

## 2023-10-17 ENCOUNTER — Ambulatory Visit: Payer: Medicare Other | Admitting: Physical Therapy

## 2023-10-22 ENCOUNTER — Ambulatory Visit: Payer: Medicare Other

## 2023-10-22 DIAGNOSIS — M5459 Other low back pain: Secondary | ICD-10-CM | POA: Diagnosis not present

## 2023-10-22 DIAGNOSIS — M5416 Radiculopathy, lumbar region: Secondary | ICD-10-CM

## 2023-10-22 DIAGNOSIS — R29818 Other symptoms and signs involving the nervous system: Secondary | ICD-10-CM

## 2023-10-22 DIAGNOSIS — R296 Repeated falls: Secondary | ICD-10-CM

## 2023-10-22 DIAGNOSIS — M6281 Muscle weakness (generalized): Secondary | ICD-10-CM

## 2023-10-22 NOTE — Therapy (Signed)
 OUTPATIENT PHYSICAL THERAPY TREATMENT   Patient Name: Travis Fitzpatrick MRN: [id number] DOB:1980/10/29, 43 y.o., male Today's Date: 10/22/23   END OF SESSION:  PT End of Session - 10/22/23 1621     Visit Number 53    Number of Visits 63    Date for PT Re-Evaluation 11/20/23    Authorization Type MEDICARE PART B reporting period from 10/08/2023    Progress Note Due on Visit 60    PT Start Time 1600    PT Stop Time 1640    PT Time Calculation (min) 40 min    Activity Tolerance Patient limited by pain;Patient tolerated treatment well    Behavior During Therapy [facility] for tasks assessed/performed              Past Medical History:  Diagnosis Date   Bowel trouble    urgency   Medical history non-contributory    Urinary urgency    Past Surgical History:  Procedure Laterality Date   BACK SURGERY  2010   CIRCUMCISION     LUMBAR LAMINECTOMY/DECOMPRESSION MICRODISCECTOMY  07/20/2011   Procedure: LUMBAR LAMINECTOMY/DECOMPRESSION MICRODISCECTOMY;  Surgeon: Carmela Hurt;  Location: [facility];  Service: Neurosurgery;  Laterality: N/A;  right thoracotomy with thoracic eight-nine discectomy and fusion   LUMBAR LAMINECTOMY/DECOMPRESSION MICRODISCECTOMY Right 10/06/2021   Procedure: Right Lumbar Four-Five Microdiscectomy, Right Lumbar Five-Sacal One Foraminotomy;  Surgeon: Coletta Memos, MD;  Location: [facility];  Service: Neurosurgery;  Laterality: Right;  3C/RM 21   THORACIC DISCECTOMY  07/16/2012   Procedure: THORACIC DISCECTOMY;  Surgeon: Carmela Hurt, MD;  Location: [facility];  Service: Neurosurgery;  Laterality: Right;  RIGHT Thoracic seven-eight  thoracic diskectomy via thoracotomy by dr Laneta Simmers   THORACIC DISCECTOMY N/A 12/15/2014   Procedure: THORACIC SEVEN TO THORACIC NINE Laminectomy ;  Surgeon: Coletta Memos, MD;  Location: [facility];  Service: Neurosurgery;  Laterality: N/A;   THORACIC DISCECTOMY N/A 06/06/2017   Procedure: LAMINECTOMY THORACIC NINE-TEN;  Surgeon: Coletta Memos, MD;  Location: [facility];  Service: Neurosurgery;  Laterality: N/A;  LAMINECTOMY THORACIC NINE-TEN   THORACOTOMY  07/20/2011   Procedure: THORACOTOMY OPEN FOR SPINE SURGERY;  Surgeon: Norton Blizzard, MD;  Location: [facility];  Service: Vascular;  Laterality: N/A;   THORACOTOMY  07/16/2012   Procedure: THORACOTOMY OPEN FOR SPINE SURGERY;  Surgeon: Alleen Borne, MD;  Location: [facility];  Service: Thoracic;  Laterality: N/A;   Patient Active Problem List   Diagnosis Date Noted   Spasticity 09/25/2023   Nerve pain 12/05/2022   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   S/P lumbar spinal fusion 05/25/2022   Synovial cyst of lumbar facet joint 05/25/2022   HNP (herniated nucleus pulposus), lumbar 10/06/2021   Hand pain 06/28/2021   Lumbar facet arthropathy 01/11/2021   Thoracic spondylosis with myelopathy 01/11/2021   Thoracic spinal stenosis 06/06/2017   Stenosis, spinal, thoracic 12/15/2014   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 09/22/2014   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/2011    PCP: Nira Retort  REFERRING PROVIDER: Ranelle Oyster, MD  REFERRING DIAG: thoracic disc disease with myelopathy, nerve pain, adhesive arachnoiditis  Rationale for Evaluation and Treatment: Rehabilitation  THERAPY DIAG:  Other low back pain  Other symptoms and signs involving the nervous system  Radiculopathy, lumbar region  Muscle weakness (generalized)  Repeated falls  ONSET DATE:  Suddenly started having weakness 11 years ago, most recent episode of worsening October 2022, s/p right L4-5 laminotomy, microdiscectomy on  10/06/2021, now s/p L4-5 PLIF by Dr. Franky Macho on 05/25/2022.  PERTINENT HISTORY:  Patient is a 43 y.o. male who presents to outpatient physical therapy with a referral for medical diagnosis thoracic disc disease with myelopathy, nerve pain, adhesive arachnoiditis. This patient's chief complaints consist of disabling low back and R  >L leg pain and weakness with activity, bowel and bladder urgency, incontinence, and retention, leading to the following functional deficits: difficulty with or unable to complete any activity that requires weight bearing, use of B LE, and/or balance including working, household and community mobility, walking, driving, going to family gatherings, avoiding incontinent episodes, playing with daughter, helping around the house, bed mobility, transfers. 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 Franky Macho on 05/25/2022, adhesive arachnoiditis, nerve pain.  Patient denies hx of cancer, stroke, seizures, lung problems, heart problems, diabetes, unexplained weight loss, and osteoporosis.  SUBJECTIVE:                                                                                                                                                                                           SUBJECTIVE STATEMENT:     Pt doing well overall. Pain well controlled on arrival.   PAIN:  NPRS: 3/10 mostly in low back, right groin/hip, bilateral hamstrings, right calf  PRECAUTIONS: Fall  PATIENT GOALS: "To get better, walk, drive again, climb stairs, play with my daughter, help around the house again"  NEXT MD VISIT:   OBJECTIVE   TODAY'S TREATMENT 10/22/23 -Seated overhead pull down (B shoulder extension from ~120 degrees flexion to bar on thighs) with cable bar 1x20 at 25# -Seated CHEST press with 23# Barbell 1x15  -silver physio ball rollout 5x15sec   -Seated overhead pull down (B shoulder  extension from ~120 degrees flexion to bar on thighs) with cable bar 1x20 at 25# -Seated CHEST press with 23# Barbell 1x15  -silver physio ball rollout 5x15sec   -Seated overhead pull down (B shoulder extension from ~120 degrees flexion to bar on thighs) with cable bar 1x20 at 25# -Seated 10lb weight olympic plate driving the bus ([id number])  -silver physio ball rollout 5x15sec   -Gray chair STS c TRX pull to stand row 1x5 -silver physio ball rollout 5x15sec   -Gray chair STS c TRX pull to stand row  1x5 -silver physio ball rollout 5x15sec   -AMB Matrix Cable to Treadmill, BRW, modI   -side stepping along treadmill bar 5x bilat   -side stepping along treadmill bar 5x bilat   Pt required multimodal cuing for proper technique and to facilitate improved neuromuscular control, strength, range of motion, and functional ability resulting in improved performance and form.   PATIENT EDUCATION:  Education details: Exercise purpose/form. Self management techniques.  Person educated: patient Education method: Explanation Education comprehension: verbalized understanding and needs further education  HOME EXERCISE PROGRAM: Verbally:  - seated lumbar flexion stretch with head down and one LE extended using rollator, 1x10 each side.  - curl up with legs elevated, 5x15 seconds - sidelying open book 1x10 each side  Access Code: [email or web address] URL: [email or web address] Date: 06/24/2023 Prepared by: Norton Blizzard  Exercises - Median Nerve Flossing - Tray  - 3 x daily - 10 reps - Seated Cervical Retraction  - 1-2 x daily - 3 sets - 10 reps  ASSESSMENT:  CLINICAL IMPRESSION:     Patient arrives with acceptable tolerance to last PT session. Pt takes recovery breaks as needed this date.Patient would benefit from continued management of limiting condition by skilled physical therapist to address remaining impairments and functional limitations to work towards stated goals and return to  PLOF or maximal functional independence.   OBJECTIVE IMPAIRMENTS: Abnormal gait, decreased activity tolerance, decreased balance, decreased coordination, decreased endurance, decreased knowledge of condition, decreased knowledge of use of DME, decreased mobility, difficulty walking, decreased ROM, decreased strength, increased edema, impaired perceived functional ability, increased muscle spasms, impaired flexibility, impaired sensation, impaired tone, improper body mechanics, postural dysfunction, obesity, and pain.   ACTIVITY LIMITATIONS: carrying, lifting, bending, standing, squatting, stairs, transfers, bed mobility, continence, bathing, toileting, dressing, hygiene/grooming, locomotion level, and caring for others  PARTICIPATION LIMITATIONS: meal prep, cleaning, laundry, interpersonal relationship, driving, shopping, community activity, occupation, yard work, and   difficulty with or unable to complete any activity that requires weight bearing, use of B LE, and/or balance including working, household and community mobility, walking, driving, going to family gatherings, avoiding incontinent episodes, playing with daughter, helping around the house, bed mobility, transfers  PERSONAL FACTORS: Past/current experiences, Time since onset of injury/illness/exacerbation, and 3+ comorbidities:   3 thoracic spine surgeries, thoracic MRI notes "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 Franky Macho on 05/25/2022, adhesive arachnoiditis, nerve pain are also affecting patient's functional outcome.   REHAB POTENTIAL: Fair due to severity and nature of condition.   CLINICAL DECISION MAKING:  Evolving/moderate complexity  EVALUATION COMPLEXITY: Moderate   GOALS: Goals reviewed with patient? No  SHORT TERM GOALS: Target date: 01/09/2023. Target date updated to 05/28/2023 for all unmet goals on 03/05/2023.   Patient will be independent with initial home exercise program for self-management of symptoms. Baseline: Initial HEP to be provided at visit 2 as appropriate (12/26/22); Goal status: MET   LONG TERM GOALS: Target date: 03/20/2023. Target date updated to 08/26/23  for all unmet goals on 06/03/2023. Target date updated to 11/20/2023 for all  unmet goals on 08/28/2023.   Patient will be independent with a long-term home exercise program for self-management of symptoms.  Baseline: Initial HEP to be provided at visit 2 as appropriate (  12/26/22); participating as able (01/31/2023); patient currently participating as tolerated (03/05/2023; 04/09/2023; 06/03/2023); participates daily in mat exercises and in weight bearing exercises as tolerated (10/08/2023);  Goal status: In-progress  2.  Patient will demonstrate improved FOTO by equal or greater than 10 points by visit #13 to demonstrate improvement in overall condition and self-reported functional ability.  Baseline: to be tested visit 2 as appropriate (12/26/22); 30 at visit #3 (01/02/2023); 46 at visit #13 (03/05/2023); 45 at visit #29 (06/03/2023); 49 at visit #50 (10/08/2023);  Goal status: MET  3.  Patient will demonstrate the ability to ambulate equal or greater than 600 feet with LRAD during the 6 minute walk to improve his household and community mobility.  Baseline: 34 feet with bari-RW (12/26/22); 200 feet with bari-RW (01/31/2023); 500 feet with BRW (03/05/2023); 400 feet with BRW (04/09/2023); 152 feet with BRW and W/C follow for safety (06/03/2023); 330 feet with BRW (10/08/2023);  Goal status: Ongoing  4.  Patient will complete 5 Time Sit To Stand Test from 19.5 inch surface or lower in equal or less than 15 seconds with no UE  support to demonstrate improved transfer ability for improved household and toileting mobility.  Baseline: 23 seconds with heavy B UE support on RW from 19.5 inch plinth. Pain throbbing down the right LE.  (12/26/22); 19 seconds with heavy B UE support on RW from 19.5 inch plinth (03/05/2023); 20 seconds with heavy B UE support on RRW from 18.5 inch plinth. Painful in back of B calves, thighs, and lower back (04/09/2023); 15 seconds with heavy B UE support on BRW from 18.5 inch plinth. Painful in back of B calves, thighs, and lower back (06/03/2023); 19 seconds with heavy B UE support on BRW from 18.5 inch plinth. Painful in back of B calves, thighs, and lower back (10/08/2023);  Goal status: Ongoing  5.  Patient will report worst pain equal or less than 3/10 with functional activities to improve his ability to complete basic household and community mobility. Baseline: up to 8/10 (12/26/22); reports 3/10 pain (01/31/2023); reports 4/10 pain (02/19/2023); up to 7/10 in the last 2 week s(03/05/2023): up to  7/10 in the last two weeks (04/09/2023); up to 8/10 in the last 2 weeks (06/03/2023);  up to 9/10 over the last 2 weeks (10/08/2023);  Goal status: Ongoing  6.  Patient will demonstrate improvement in Patient Specific Functional Scale (PSFS) by equal or greater than 3/10 points to reflect clinically significant improvement in patient's most valued functional activities. Baseline: 1.7/10 for Walking Around [address], [place], and Driving (82/95/62); Goal status: NEW    PLAN:  PT FREQUENCY: 1-2x/week  PT DURATION: 12 weeks  PLANNED INTERVENTIONS: Therapeutic exercises, Therapeutic activity, Neuromuscular re-education, Balance training, Gait training, Patient/Family education, Self Care, Joint mobilization, Stair training, Orthotic/Fit training, DME instructions, Aquatic Therapy, Dry Needling, Electrical stimulation, Wheelchair mobility training, Spinal mobilization, Cryotherapy, Moist heat, Manual  therapy, and Re-evaluation.  PLAN FOR NEXT SESSION:  update HEP as appropriate, neurodynamics, gait training, LE/core/functional strengthening, stretching, and balance as tolerated, education, manual therapy as needed.   4:26 PM, 10/22/23 Rosamaria Lints, PT, DPT Physical Therapist - [place] [phone] (Office)

## 2023-10-24 ENCOUNTER — Ambulatory Visit: Payer: Medicare Other | Admitting: Physical Therapy

## 2023-10-29 ENCOUNTER — Ambulatory Visit: Payer: Medicare Other | Admitting: Physical Therapy

## 2023-10-31 ENCOUNTER — Ambulatory Visit: Payer: Medicare Other | Admitting: Physical Therapy

## 2023-11-05 ENCOUNTER — Ambulatory Visit: Payer: Medicare Other | Admitting: Physical Therapy

## 2023-11-05 ENCOUNTER — Encounter: Payer: Self-pay | Admitting: Physical Therapy

## 2023-11-05 DIAGNOSIS — M5416 Radiculopathy, lumbar region: Secondary | ICD-10-CM

## 2023-11-05 DIAGNOSIS — M5459 Other low back pain: Secondary | ICD-10-CM | POA: Diagnosis not present

## 2023-11-05 DIAGNOSIS — R262 Difficulty in walking, not elsewhere classified: Secondary | ICD-10-CM

## 2023-11-05 DIAGNOSIS — M6281 Muscle weakness (generalized): Secondary | ICD-10-CM

## 2023-11-05 DIAGNOSIS — R296 Repeated falls: Secondary | ICD-10-CM

## 2023-11-05 DIAGNOSIS — R29818 Other symptoms and signs involving the nervous system: Secondary | ICD-10-CM

## 2023-11-05 NOTE — Therapy (Signed)
 OUTPATIENT PHYSICAL THERAPY TREATMENT   Patient Name: Travis Fitzpatrick MRN: [id number] DOB:Jan 11, 1981, 43 y.o., male Today's Date: 11/05/23   END OF SESSION:  PT End of Session - 11/05/23 1637     Visit Number 54    Number of Visits 63    Date for PT Re-Evaluation 11/20/23    Authorization Type MEDICARE PART B reporting period from 10/08/2023    Progress Note Due on Visit 60    PT Start Time 1607    PT Stop Time 1645    PT Time Calculation (min) 38 min    Activity Tolerance Patient limited by pain;Patient tolerated treatment well    Behavior During Therapy [facility] for tasks assessed/performed               Past Medical History:  Diagnosis Date   Bowel trouble    urgency   Medical history non-contributory    Urinary urgency    Past Surgical History:  Procedure Laterality Date   BACK SURGERY  2010   CIRCUMCISION     LUMBAR LAMINECTOMY/DECOMPRESSION MICRODISCECTOMY  07/20/2011   Procedure: LUMBAR LAMINECTOMY/DECOMPRESSION MICRODISCECTOMY;  Surgeon: Carmela Hurt;  Location: [facility];  Service: Neurosurgery;  Laterality: N/A;  right thoracotomy with thoracic eight-nine discectomy and fusion   LUMBAR LAMINECTOMY/DECOMPRESSION MICRODISCECTOMY Right 10/06/2021   Procedure: Right Lumbar Four-Five Microdiscectomy, Right Lumbar Five-Sacal One Foraminotomy;  Surgeon: Coletta Memos, MD;  Location: [facility];  Service: Neurosurgery;  Laterality: Right;  3C/RM 21   THORACIC DISCECTOMY  07/16/2012   Procedure: THORACIC DISCECTOMY;  Surgeon: Carmela Hurt, MD;  Location: [facility];  Service: Neurosurgery;  Laterality: Right;  RIGHT Thoracic seven-eight  thoracic diskectomy via thoracotomy by dr Laneta Simmers   THORACIC DISCECTOMY N/A 12/15/2014   Procedure: THORACIC SEVEN TO THORACIC NINE Laminectomy ;  Surgeon: Coletta Memos, MD;  Location: [facility];  Service: Neurosurgery;  Laterality: N/A;   THORACIC DISCECTOMY N/A 06/06/2017   Procedure: LAMINECTOMY THORACIC NINE-TEN;  Surgeon: Coletta Memos, MD;  Location: [facility];  Service: Neurosurgery;  Laterality: N/A;  LAMINECTOMY THORACIC NINE-TEN   THORACOTOMY  07/20/2011   Procedure: THORACOTOMY OPEN FOR SPINE SURGERY;  Surgeon: Norton Blizzard, MD;  Location: [facility];  Service: Vascular;  Laterality: N/A;   THORACOTOMY  07/16/2012   Procedure: THORACOTOMY OPEN FOR SPINE SURGERY;  Surgeon: Alleen Borne, MD;  Location: [facility];  Service: Thoracic;  Laterality: N/A;   Patient Active Problem List   Diagnosis Date Noted   Spasticity 09/25/2023   Nerve pain 12/05/2022   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   S/P lumbar spinal fusion 05/25/2022   Synovial cyst of lumbar facet joint 05/25/2022   HNP (herniated nucleus pulposus), lumbar 10/06/2021   Hand pain 06/28/2021   Lumbar facet arthropathy 01/11/2021   Thoracic spondylosis with myelopathy 01/11/2021   Thoracic spinal stenosis 06/06/2017   Stenosis, spinal, thoracic 12/15/2014   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 09/22/2014   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/2011    PCP: Nira Retort  REFERRING PROVIDER: Ranelle Oyster, MD  REFERRING DIAG: thoracic disc disease with myelopathy, nerve pain, adhesive arachnoiditis  Rationale for Evaluation and Treatment: Rehabilitation  THERAPY DIAG:  Other low back pain  Other symptoms and signs involving the nervous system  Radiculopathy, lumbar region  Muscle weakness (generalized)  Repeated falls  Difficulty in walking, not elsewhere classified  ONSET DATE:  Suddenly started having weakness 11 years ago, most recent episode of worsening  October 2022, s/p right L4-5 laminotomy, microdiscectomy on 10/06/2021, now s/p L4-5 PLIF by Dr. Franky Macho on 05/25/2022.  PERTINENT HISTORY:  Patient is a 43 y.o. male who presents to outpatient physical therapy with a referral for medical diagnosis thoracic disc disease with myelopathy, nerve pain, adhesive arachnoiditis. This patient's  chief complaints consist of disabling low back and R >L leg pain and weakness with activity, bowel and bladder urgency, incontinence, and retention, leading to the following functional deficits: difficulty with or unable to complete any activity that requires weight bearing, use of B LE, and/or balance including working, household and community mobility, walking, driving, going to family gatherings, avoiding incontinent episodes, playing with daughter, helping around the house, bed mobility, transfers. 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 Franky Macho on 05/25/2022, adhesive arachnoiditis, nerve pain.  Patient denies hx of cancer, stroke, seizures, lung problems, heart problems, diabetes, unexplained weight loss, and osteoporosis.  SUBJECTIVE:                                                                                                                                                                                           SUBJECTIVE STATEMENT:     Patient states last week he started feeling like when he had to go to the hospital. He was sweating when he was just sitting at home and it was cold. He felt nausea, vomiting, weakness and lightheadedness when trying to get up. He had to cancel his PT appointments. He started feeling better on Saturday but he still is getting intermittent nausea and lightheadedness when going from supine to sit and to standing. He also fell on his buttocks on Friday.   PAIN:  NPRS: 3/10 mostly in low back, right groin/hip, bilateral posterior thighs, right  calf  PRECAUTIONS: Fall  PATIENT GOALS: "To get better, walk, drive again, climb stairs, play with my daughter, help around the house again"  NEXT MD VISIT:   OBJECTIVE   TREATMENT Manual therapy: to reduce pain and tissue tension, improve range of motion, neuromodulation, in order to promote improved ability to complete functional activities. Hooklying R hip caudal/posterior glide with belt, 4x45 seconds, grade IV  Therapeutic exercise: to centralize symptoms and improve ROM, strength, muscular endurance, and activity tolerance required for successful completion of functional activities.  Seated CHEST press with  3x15 with 23# Barbell  Seated overhead pull down (B shoulder extension from ~120 degrees flexion to bar on thighs) with cable bar  3x20 at 20#   Seated lumbar flexion roll out with clear theraball to decrease pain after irritating activities.  1x duration to patient preference   Therapeutic activities: dynamic therapeutic activities incorporating MULTIPLE parameters or areas of the body designed to achieve improved functional performance.  Seated marching while holding 15# slam ball in front of chest 1x10 each side Modified to decrease R hip ROM due to increasing pulling at posterior R hip Headache afterwards   TRX sit <> stand from 17 inch grey chair with airex on it (started with a2z pad on it too, but removed when too easy).  3x5/5/6 with effort to maintain position of spine that does not provoke symptoms.  Self selected rest between each set First set with a lot of failed attempts, pt fearful of falling CGA with assistance with positioning and stabilizing chair and TRX   Pt required multimodal cuing for proper technique and to facilitate improved neuromuscular control, strength, range of motion, and functional ability resulting in improved performance and form.   PATIENT EDUCATION:  Education details: Exercise purpose/form. Self management techniques.   Person educated: patient Education method: Explanation Education comprehension: verbalized understanding and needs further education  HOME EXERCISE PROGRAM: Verbally:  - seated lumbar flexion stretch with head down and one LE extended using rollator, 1x10 each side.  - curl up with legs elevated, 5x15 seconds - sidelying open book 1x10 each side  Access Code: [email or web address] URL: [email or web address] Date: 06/24/2023 Prepared by: Norton Blizzard  Exercises - Median Nerve Flossing - Tray  - 3 x daily - 10 reps - Seated Cervical Retraction  - 1-2 x daily - 3 sets - 10 reps  ASSESSMENT:  CLINICAL IMPRESSION:     Patient arrives following a apparent heavy flair in adhesive arachnoiditis that caused a lot of constitutional symptoms. Proceeded carefully with today's session based on patient's tolerance. He continues to be limited in what he can tolerate by low back and R LE pain but was able to finish the session with acceptable discomfort. Patient would benefit from continued management of limiting condition by skilled physical therapist to address remaining impairments and functional limitations to work towards stated goals and return to PLOF or maximal functional independence.   OBJECTIVE IMPAIRMENTS: Abnormal gait, decreased activity tolerance, decreased balance, decreased coordination, decreased endurance, decreased knowledge of condition, decreased knowledge of use of DME, decreased mobility, difficulty walking, decreased ROM, decreased strength, increased edema, impaired perceived functional ability, increased muscle spasms, impaired flexibility, impaired sensation, impaired tone, improper body mechanics, postural dysfunction, obesity, and pain.   ACTIVITY LIMITATIONS: carrying, lifting, bending, standing, squatting, stairs, transfers, bed mobility, continence, bathing, toileting, dressing, hygiene/grooming, locomotion level, and caring for others  PARTICIPATION LIMITATIONS: meal  prep, cleaning, laundry, interpersonal relationship, driving, shopping, community activity, occupation, yard work, and   difficulty with or unable to complete any activity that requires weight bearing, use of B LE, and/or balance including working, household and community mobility, walking, driving, going to family gatherings, avoiding incontinent episodes, playing with daughter, helping around the house, bed mobility, transfers  PERSONAL FACTORS: Past/current experiences, Time since onset of injury/illness/exacerbation, and 3+ comorbidities:   3 thoracic spine surgeries, thoracic MRI notes "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 Franky Macho on 05/25/2022, adhesive arachnoiditis, nerve pain are also affecting patient's functional outcome.   REHAB POTENTIAL: Fair due to severity and nature of condition.   CLINICAL DECISION MAKING: Evolving/moderate complexity  EVALUATION COMPLEXITY: Moderate   GOALS: Goals reviewed with patient? No  SHORT TERM GOALS: Target date: 01/09/2023. Target date updated to 05/28/2023 for all unmet goals on 03/05/2023.   Patient will be independent with initial home exercise program for self-management of symptoms. Baseline: Initial HEP to be provided at visit 2 as appropriate (12/26/22); Goal status: MET   LONG TERM GOALS: Target date: 03/20/2023. Target date updated to 08/26/23  for all unmet goals on 06/03/2023. Target date updated to 11/20/2023 for all  unmet goals on 08/28/2023.   Patient will be independent with a long-term home exercise program for self-management of symptoms.  Baseline: Initial HEP to be provided at visit 2 as appropriate (12/26/22);  participating as able (01/31/2023); patient currently participating as tolerated (03/05/2023; 04/09/2023; 06/03/2023); participates daily in mat exercises and in weight bearing exercises as tolerated (10/08/2023);  Goal status: In-progress  2.  Patient will demonstrate improved FOTO by equal or greater than 10 points by visit #13 to demonstrate improvement in overall condition and self-reported functional ability.  Baseline: to be tested visit 2 as appropriate (12/26/22); 30 at visit #3 (01/02/2023); 46 at visit #13 (03/05/2023); 45 at visit #29 (06/03/2023); 49 at visit #50 (10/08/2023);  Goal status: MET  3.  Patient will demonstrate the ability to ambulate equal or greater than 600 feet with LRAD during the 6 minute walk to improve his household and community mobility.  Baseline: 34 feet with bari-RW (12/26/22); 200 feet with bari-RW (01/31/2023); 500 feet with BRW (03/05/2023); 400 feet with BRW (04/09/2023); 152 feet with BRW and W/C follow for safety (06/03/2023); 330 feet with BRW (10/08/2023);  Goal status: Ongoing  4.  Patient will complete 5 Time Sit To Stand Test from 19.5 inch surface or lower in equal or less than 15 seconds with no UE support to demonstrate improved transfer ability for improved household and toileting mobility.  Baseline: 23 seconds with heavy B UE support on RW from 19.5 inch plinth. Pain throbbing down the right LE.  (12/26/22); 19 seconds with heavy B UE support on RW from 19.5 inch plinth (03/05/2023); 20 seconds with heavy B UE support on RRW from 18.5 inch plinth. Painful in back of B calves, thighs, and lower back (04/09/2023); 15 seconds with heavy B UE support on BRW from 18.5 inch plinth. Painful in back of B calves, thighs, and lower back (06/03/2023); 19 seconds with heavy B UE support on BRW from 18.5 inch plinth. Painful in back of B calves, thighs, and lower back (10/08/2023);  Goal status: Ongoing  5.  Patient will report worst pain equal or less than 3/10 with  functional activities to improve his ability to complete basic household and community mobility. Baseline: up to 8/10 (12/26/22); reports 3/10 pain (01/31/2023); reports 4/10 pain (02/19/2023); up to 7/10 in the last 2 week s(03/05/2023): up to  7/10 in the last two weeks (04/09/2023); up to 8/10 in the last 2 weeks (06/03/2023);  up to 9/10 over the last 2 weeks (10/08/2023);  Goal status: Ongoing  6.  Patient will demonstrate improvement in Patient Specific Functional Scale (PSFS) by equal or greater than 3/10 points  to reflect clinically significant improvement in patient's most valued functional activities. Baseline: 1.7/10 for Walking Around [address], [place], and Driving ([birth year]); Goal status: NEW    PLAN:  PT FREQUENCY: 1-2x/week  PT DURATION: 12 weeks  PLANNED INTERVENTIONS: Therapeutic exercises, Therapeutic activity, Neuromuscular re-education, Balance training, Gait training, Patient/Family education, Self Care, Joint mobilization, Stair training, Orthotic/Fit training, DME instructions, Aquatic Therapy, Dry Needling, Electrical stimulation, Wheelchair mobility training, Spinal mobilization, Cryotherapy, Moist heat, Manual therapy, and Re-evaluation.  PLAN FOR NEXT SESSION:  update HEP as appropriate, neurodynamics, gait training, LE/core/functional strengthening, stretching, and balance as tolerated, education, manual therapy as needed.   Luretha Murphy. Ilsa Iha, PT, DPT 11/05/23, 7:44 PM  [facility] [address] [place], [place] [address] P: [phone] I F: [phone]

## 2023-11-07 ENCOUNTER — Ambulatory Visit: Payer: Medicare Other | Admitting: Physical Therapy

## 2023-11-07 ENCOUNTER — Encounter: Payer: Self-pay | Admitting: Physical Therapy

## 2023-11-07 VITALS — BP 127/86 | HR 108

## 2023-11-07 DIAGNOSIS — M5416 Radiculopathy, lumbar region: Secondary | ICD-10-CM

## 2023-11-07 DIAGNOSIS — M5459 Other low back pain: Secondary | ICD-10-CM

## 2023-11-07 DIAGNOSIS — R296 Repeated falls: Secondary | ICD-10-CM

## 2023-11-07 DIAGNOSIS — R262 Difficulty in walking, not elsewhere classified: Secondary | ICD-10-CM

## 2023-11-07 DIAGNOSIS — R29818 Other symptoms and signs involving the nervous system: Secondary | ICD-10-CM

## 2023-11-07 DIAGNOSIS — M6281 Muscle weakness (generalized): Secondary | ICD-10-CM

## 2023-11-07 NOTE — Therapy (Unsigned)
 OUTPATIENT PHYSICAL THERAPY TREATMENT   Patient Name: Travis Fitzpatrick MRN: [id number] DOB:Dec 16, 1980, 43 y.o., male Today's Date: 11/07/23   END OF SESSION:  PT End of Session - 11/07/23 1605     Visit Number 55    Number of Visits 63    Date for PT Re-Evaluation 11/20/23    Authorization Type MEDICARE PART B reporting period from 10/08/2023    Progress Note Due on Visit 60    PT Start Time 1604    PT Stop Time 1644    PT Time Calculation (min) 40 min    Activity Tolerance Patient limited by pain;Patient tolerated treatment well    Behavior During Therapy [facility] for tasks assessed/performed                Past Medical History:  Diagnosis Date   Bowel trouble    urgency   Medical history non-contributory    Urinary urgency    Past Surgical History:  Procedure Laterality Date   BACK SURGERY  2010   CIRCUMCISION     LUMBAR LAMINECTOMY/DECOMPRESSION MICRODISCECTOMY  07/20/2011   Procedure: LUMBAR LAMINECTOMY/DECOMPRESSION MICRODISCECTOMY;  Surgeon: Carmela Hurt;  Location: [facility];  Service: Neurosurgery;  Laterality: N/A;  right thoracotomy with thoracic eight-nine discectomy and fusion   LUMBAR LAMINECTOMY/DECOMPRESSION MICRODISCECTOMY Right 10/06/2021   Procedure: Right Lumbar Four-Five Microdiscectomy, Right Lumbar Five-Sacal One Foraminotomy;  Surgeon: Coletta Memos, MD;  Location: [facility];  Service: Neurosurgery;  Laterality: Right;  3C/RM 21   THORACIC DISCECTOMY  07/16/2012   Procedure: THORACIC DISCECTOMY;  Surgeon: Carmela Hurt, MD;  Location: [facility];  Service: Neurosurgery;  Laterality: Right;  RIGHT Thoracic seven-eight  thoracic diskectomy via thoracotomy by dr Laneta Simmers   THORACIC DISCECTOMY N/A 12/15/2014   Procedure: THORACIC SEVEN TO THORACIC NINE Laminectomy ;  Surgeon: Coletta Memos, MD;  Location: [facility];  Service: Neurosurgery;  Laterality: N/A;   THORACIC DISCECTOMY N/A 06/06/2017   Procedure: LAMINECTOMY THORACIC NINE-TEN;  Surgeon: Coletta Memos, MD;  Location: [facility] OR;  Service: Neurosurgery;  Laterality: N/A;  LAMINECTOMY THORACIC NINE-TEN   THORACOTOMY  07/20/2011   Procedure: THORACOTOMY OPEN FOR SPINE SURGERY;  Surgeon: Norton Blizzard, MD;  Location: [facility];  Service: Vascular;  Laterality: N/A;   THORACOTOMY  07/16/2012   Procedure: THORACOTOMY OPEN FOR SPINE SURGERY;  Surgeon: Alleen Borne, MD;  Location: [facility];  Service: Thoracic;  Laterality: N/A;   Patient Active Problem List   Diagnosis Date Noted   Spasticity 09/25/2023   Nerve pain 12/05/2022   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   S/P lumbar spinal fusion 05/25/2022   Synovial cyst of lumbar facet joint 05/25/2022   HNP (herniated nucleus pulposus), lumbar 10/06/2021   Hand pain 06/28/2021   Lumbar facet arthropathy 01/11/2021   Thoracic spondylosis with myelopathy 01/11/2021   Thoracic spinal stenosis 06/06/2017   Stenosis, spinal, thoracic 12/15/2014   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 09/22/2014   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/2011    PCP: Nira Retort  REFERRING PROVIDER: Ranelle Oyster, MD  REFERRING DIAG: thoracic disc disease with myelopathy, nerve pain, adhesive arachnoiditis  Rationale for Evaluation and Treatment: Rehabilitation  THERAPY DIAG:  Other low back pain  Other symptoms and signs involving the nervous system  Radiculopathy, lumbar region  Muscle weakness (generalized)  Repeated falls  Difficulty in walking, not elsewhere classified  ONSET DATE:  Suddenly started having weakness 11 years ago, most recent episode of  worsening October 2022, s/p right L4-5 laminotomy, microdiscectomy on 10/06/2021, now s/p L4-5 PLIF by Dr. Franky Macho on 05/25/2022.  PERTINENT HISTORY:  Patient is a 43 y.o. male who presents to outpatient physical therapy with a referral for medical diagnosis thoracic disc disease with myelopathy, nerve pain, adhesive arachnoiditis. This patient's  chief complaints consist of disabling low back and R >L leg pain and weakness with activity, bowel and bladder urgency, incontinence, and retention, leading to the following functional deficits: difficulty with or unable to complete any activity that requires weight bearing, use of B LE, and/or balance including working, household and community mobility, walking, driving, going to family gatherings, avoiding incontinent episodes, playing with daughter, helping around the house, bed mobility, transfers. 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 Franky Macho on 05/25/2022, adhesive arachnoiditis, nerve pain.  Patient denies hx of cancer, stroke, seizures, lung problems, heart problems, diabetes, unexplained weight loss, and osteoporosis.  SUBJECTIVE:                                                                                                                                                                                           SUBJECTIVE STATEMENT:     Patient states he is feeling a lot better than he has in the last 2 weeks. He felt good after last time he came to PT. Nothing stiffened up but he did have to lay in the dark for a while because he did get light headed. Patient states he doesn't drink soda, but he noticed he felt better and not as lightheaded after he drank one of his wife's mountain dews.   PAIN:  NPRS: 2/10 mostly in low back, right groin/hip, bilateral posterior thighs, right calf  PRECAUTIONS: Fall  PATIENT GOALS: "To get better, walk, drive again, climb stairs, play  with my daughter, help around the house again"  NEXT MD VISIT:   OBJECTIVE  Vitals:   11/07/23 1620  BP: 127/86  Pulse: (!) 108  SpO2: 100%  Automatic cuff  TREATMENT Manual therapy: to reduce pain and tissue tension, improve range of motion, neuromodulation, in order to promote improved ability to complete functional activities. Hooklying R hip caudal/posterior glide with belt, 4x45 seconds, grade IV  Therapeutic exercise: to centralize symptoms and improve ROM, strength, muscular endurance, and  activity tolerance required for successful completion of functional activities.    Vitals measurement due to recent lightheadedness   Seated overhead pull down (B shoulder extension from ~120 degrees flexion to bar on thighs) with cable bar  3x20 at 25#  Seated CHEST press with  3x15 with 23# Barbell   Seated lumbar flexion roll out with clear theraball to decrease pain after irritating activities.  1x duration to patient preference   Therapeutic activities: dynamic therapeutic activities incorporating MULTIPLE parameters or areas of the body designed to achieve improved functional performance.  Seated marching from elevated plinth (23.5 inches) while holding 15# slam ball in front of chest  1x10 each side Modified to decrease R hip ROM due to increasing pulling at posterior R hip   TRX sit <> stand from 17 inch grey chair  3x5 with effort to maintain position of spine that does not provoke symptoms.  Self selected rest between each set CGA with assistance with positioning and stabilizing chair and TRX   Pt required multimodal cuing for proper technique and to facilitate improved neuromuscular control, strength, range of motion, and functional ability resulting in improved performance and form.   PATIENT EDUCATION:  Education details: Exercise purpose/form. Self management techniques.  Person educated: patient Education method: Explanation Education comprehension: verbalized  understanding and needs further education  HOME EXERCISE PROGRAM: Verbally:  - seated lumbar flexion stretch with head down and one LE extended using rollator, 1x10 each side.  - curl up with legs elevated, 5x15 seconds - sidelying open book 1x10 each side  Access Code: [email or web address] URL: [email or web address] Date: 06/24/2023 Prepared by: Norton Blizzard  Exercises - Median Nerve Flossing - Tray  - 3 x daily - 10 reps - Seated Cervical Retraction  - 1-2 x daily - 3 sets - 10 reps  ASSESSMENT:  CLINICAL IMPRESSION:     Patient arrives following a apparent heavy flair in adhesive arachnoiditis that caused a lot of constitutional symptoms. Proceeded carefully with today's session based on patient's tolerance. He continues to be limited in what he can tolerate by low back and R LE pain but was able to finish the session with acceptable discomfort. Patient would benefit from continued management of limiting condition by skilled physical therapist to address remaining impairments and functional limitations to work towards stated goals and return to PLOF or maximal functional independence.   OBJECTIVE IMPAIRMENTS: Abnormal gait, decreased activity tolerance, decreased balance, decreased coordination, decreased endurance, decreased knowledge of condition, decreased knowledge of use of DME, decreased mobility, difficulty walking, decreased ROM, decreased strength, increased edema, impaired perceived functional ability, increased muscle spasms, impaired flexibility, impaired sensation, impaired tone, improper body mechanics, postural dysfunction, obesity, and pain.   ACTIVITY LIMITATIONS: carrying, lifting, bending, standing, squatting, stairs, transfers, bed mobility, continence, bathing, toileting, dressing, hygiene/grooming, locomotion level, and caring for others  PARTICIPATION LIMITATIONS: meal prep, cleaning, laundry, interpersonal relationship, driving, shopping, community activity,  occupation, yard work, and   difficulty with or unable to complete any activity that requires weight bearing, use of B LE, and/or balance including working, household and community mobility, walking, driving, going to family gatherings, avoiding incontinent episodes, playing with daughter, helping around the house, bed mobility, transfers  PERSONAL FACTORS: Past/current experiences, Time since onset of injury/illness/exacerbation, and 3+ comorbidities:   3 thoracic spine surgeries, thoracic MRI notes "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 Franky Macho on 05/25/2022, adhesive arachnoiditis, nerve pain are also affecting patient's functional outcome.   REHAB POTENTIAL: Fair due to severity and nature of condition.   CLINICAL DECISION MAKING: Evolving/moderate complexity  EVALUATION COMPLEXITY: Moderate   GOALS: Goals reviewed with patient? No  SHORT TERM GOALS: Target date: 01/09/2023. Target date updated to 05/28/2023 for all unmet goals on 03/05/2023.   Patient will be independent with initial home exercise program for self-management of symptoms. Baseline: Initial HEP to be provided at visit 2 as appropriate (12/26/22); Goal status: MET   LONG TERM GOALS: Target date: 03/20/2023. Target date updated to 08/26/23  for all unmet goals on 06/03/2023. Target date updated to 11/20/2023 for all  unmet goals on 08/28/2023.   Patient will be independent with a long-term home exercise program for self-management of symptoms.  Baseline: Initial HEP to be provided at visit 2 as appropriate (12/26/22); participating as able (01/31/2023); patient currently participating as tolerated (03/05/2023;  04/09/2023; 06/03/2023); participates daily in mat exercises and in weight bearing exercises as tolerated (10/08/2023);  Goal status: In-progress  2.  Patient will demonstrate improved FOTO by equal or greater than 10 points by visit #13 to demonstrate improvement in overall condition and self-reported functional ability.  Baseline: to be tested visit 2 as appropriate (12/26/22); 30 at visit #3 (01/02/2023); 46 at visit #13 (03/05/2023); 45 at visit #29 (06/03/2023); 49 at visit #50 (10/08/2023);  Goal status: MET  3.  Patient will demonstrate the ability to ambulate equal or greater than 600 feet with LRAD during the 6 minute walk to improve his household and community mobility.  Baseline: 34 feet with bari-RW (12/26/22); 200 feet with bari-RW (01/31/2023); 500 feet with BRW (03/05/2023); 400 feet with BRW (04/09/2023); 152 feet with BRW and W/C follow for safety (06/03/2023); 330 feet with BRW (10/08/2023);  Goal status: Ongoing  4.  Patient will complete 5 Time Sit To Stand Test from 19.5 inch surface or lower in equal or less than 15 seconds with no UE support to demonstrate improved transfer ability for improved household and toileting mobility.  Baseline: 23 seconds with heavy B UE support on RW from 19.5 inch plinth. Pain throbbing down the right LE.  (12/26/22); 19 seconds with heavy B UE support on RW from 19.5 inch plinth (03/05/2023); 20 seconds with heavy B UE support on RRW from 18.5 inch plinth. Painful in back of B calves, thighs, and lower back (04/09/2023); 15 seconds with heavy B UE support on BRW from 18.5 inch plinth. Painful in back of B calves, thighs, and lower back (06/03/2023); 19 seconds with heavy B UE support on BRW from 18.5 inch plinth. Painful in back of B calves, thighs, and lower back (10/08/2023);  Goal status: Ongoing  5.  Patient will report worst pain equal or less than 3/10 with functional activities to improve his ability to complete basic household and community  mobility. Baseline: up to 8/10 (12/26/22); reports 3/10 pain (01/31/2023); reports 4/10 pain (02/19/2023); up to 7/10 in the last 2 week s(03/05/2023): up to  7/10 in the last two weeks (04/09/2023); up to 8/10 in the last 2 weeks (06/03/2023);  up to 9/10 over the last 2 weeks (10/08/2023);  Goal status: Ongoing  6.  Patient will demonstrate improvement in Patient Specific Functional Scale (PSFS) by equal or greater than 3/10 points to reflect clinically significant  improvement in patient's most valued functional activities. Baseline: 1.7/10 for Walking Around [address], [place], and Driving (69/62/95); Goal status: NEW    PLAN:  PT FREQUENCY: 1-2x/week  PT DURATION: 12 weeks  PLANNED INTERVENTIONS: Therapeutic exercises, Therapeutic activity, Neuromuscular re-education, Balance training, Gait training, Patient/Family education, Self Care, Joint mobilization, Stair training, Orthotic/Fit training, DME instructions, Aquatic Therapy, Dry Needling, Electrical stimulation, Wheelchair mobility training, Spinal mobilization, Cryotherapy, Moist heat, Manual therapy, and Re-evaluation.  PLAN FOR NEXT SESSION:  update HEP as appropriate, neurodynamics, gait training, LE/core/functional strengthening, stretching, and balance as tolerated, education, manual therapy as needed.   Luretha Murphy. Ilsa Iha, PT, DPT 11/07/23, 8:10 PM  [facility] [address] [place], [place] [address] P: [phone] I F: [phone]

## 2023-11-11 ENCOUNTER — Encounter: Payer: Self-pay | Admitting: Physical Therapy

## 2023-11-11 ENCOUNTER — Ambulatory Visit: Payer: Medicare Other | Admitting: Physical Therapy

## 2023-11-11 DIAGNOSIS — R29818 Other symptoms and signs involving the nervous system: Secondary | ICD-10-CM

## 2023-11-11 DIAGNOSIS — M5416 Radiculopathy, lumbar region: Secondary | ICD-10-CM

## 2023-11-11 DIAGNOSIS — M5459 Other low back pain: Secondary | ICD-10-CM | POA: Diagnosis not present

## 2023-11-11 DIAGNOSIS — M6281 Muscle weakness (generalized): Secondary | ICD-10-CM

## 2023-11-11 DIAGNOSIS — R262 Difficulty in walking, not elsewhere classified: Secondary | ICD-10-CM

## 2023-11-11 DIAGNOSIS — R296 Repeated falls: Secondary | ICD-10-CM

## 2023-11-11 NOTE — Therapy (Signed)
 OUTPATIENT PHYSICAL THERAPY TREATMENT   Patient Name: Travis Fitzpatrick MRN: [id number] DOB:02-21-1981, 43 y.o., male Today's Date: 11/11/23   END OF SESSION:  PT End of Session - 11/11/23 1954     Visit Number 56    Number of Visits 63    Date for PT Re-Evaluation 11/20/23    Authorization Type MEDICARE PART B reporting period from 10/08/2023    Progress Note Due on Visit 60    PT Start Time 1605    PT Stop Time 1647    PT Time Calculation (min) 42 min    Activity Tolerance Patient limited by pain;Patient tolerated treatment well    Behavior During Therapy [facility] for tasks assessed/performed                 Past Medical History:  Diagnosis Date   Bowel trouble    urgency   Medical history non-contributory    Urinary urgency    Past Surgical History:  Procedure Laterality Date   BACK SURGERY  2010   CIRCUMCISION     LUMBAR LAMINECTOMY/DECOMPRESSION MICRODISCECTOMY  07/20/2011   Procedure: LUMBAR LAMINECTOMY/DECOMPRESSION MICRODISCECTOMY;  Surgeon: Carmela Hurt;  Location: [facility];  Service: Neurosurgery;  Laterality: N/A;  right thoracotomy with thoracic eight-nine discectomy and fusion   LUMBAR LAMINECTOMY/DECOMPRESSION MICRODISCECTOMY Right 10/06/2021   Procedure: Right Lumbar Four-Five Microdiscectomy, Right Lumbar Five-Sacal One Foraminotomy;  Surgeon: Coletta Memos, MD;  Location: [facility];  Service: Neurosurgery;  Laterality: Right;  3C/RM 21   THORACIC DISCECTOMY  07/16/2012   Procedure: THORACIC DISCECTOMY;  Surgeon: Carmela Hurt, MD;  Location: [facility];  Service: Neurosurgery;  Laterality: Right;  RIGHT Thoracic seven-eight  thoracic diskectomy via thoracotomy by dr Laneta Simmers   THORACIC DISCECTOMY N/A 12/15/2014   Procedure: THORACIC SEVEN TO THORACIC NINE Laminectomy ;  Surgeon: Coletta Memos, MD;  Location: [facility];  Service: Neurosurgery;  Laterality: N/A;   THORACIC DISCECTOMY N/A 06/06/2017   Procedure: LAMINECTOMY THORACIC NINE-TEN;  Surgeon:  Coletta Memos, MD;  Location: [facility];  Service: Neurosurgery;  Laterality: N/A;  LAMINECTOMY THORACIC NINE-TEN   THORACOTOMY  07/20/2011   Procedure: THORACOTOMY OPEN FOR SPINE SURGERY;  Surgeon: Norton Blizzard, MD;  Location: [facility];  Service: Vascular;  Laterality: N/A;   THORACOTOMY  07/16/2012   Procedure: THORACOTOMY OPEN FOR SPINE SURGERY;  Surgeon: Alleen Borne, MD;  Location: [facility];  Service: Thoracic;  Laterality: N/A;   Patient Active Problem List   Diagnosis Date Noted   Spasticity 09/25/2023   Nerve pain 12/05/2022   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   S/P lumbar spinal fusion 05/25/2022   Synovial cyst of lumbar facet joint 05/25/2022   HNP (herniated nucleus pulposus), lumbar 10/06/2021   Hand pain 06/28/2021   Lumbar facet arthropathy 01/11/2021   Thoracic spondylosis with myelopathy 01/11/2021   Thoracic spinal stenosis 06/06/2017   Stenosis, spinal, thoracic 12/15/2014   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 09/22/2014   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/2011    PCP: Nira Retort  REFERRING PROVIDER: Ranelle Oyster, MD  REFERRING DIAG: thoracic disc disease with myelopathy, nerve pain, adhesive arachnoiditis  Rationale for Evaluation and Treatment: Rehabilitation  THERAPY DIAG:  Other low back pain  Other symptoms and signs involving the nervous system  Radiculopathy, lumbar region  Muscle weakness (generalized)  Repeated falls  Difficulty in walking, not elsewhere classified  ONSET DATE:  Suddenly started having weakness 11 years ago, most recent episode  of worsening October 2022, s/p right L4-5 laminotomy, microdiscectomy on 10/06/2021, now s/p L4-5 PLIF by Dr. Franky Macho on 05/25/2022.  PERTINENT HISTORY:  Patient is a 43 y.o. male who presents to outpatient physical therapy with a referral for medical diagnosis thoracic disc disease with myelopathy, nerve pain, adhesive arachnoiditis. This  patient's chief complaints consist of disabling low back and R >L leg pain and weakness with activity, bowel and bladder urgency, incontinence, and retention, leading to the following functional deficits: difficulty with or unable to complete any activity that requires weight bearing, use of B LE, and/or balance including working, household and community mobility, walking, driving, going to family gatherings, avoiding incontinent episodes, playing with daughter, helping around the house, bed mobility, transfers. 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 Franky Macho on 05/25/2022, adhesive arachnoiditis, nerve pain.  Patient denies hx of cancer, stroke, seizures, lung problems, heart problems, diabetes, unexplained weight loss, and osteoporosis.  SUBJECTIVE:                                                                                                                                                                                           SUBJECTIVE STATEMENT:     Patient states his pain is elevated today, and this started yesterday. He did not do anything out of the ordinary. He is only having pain/weakness not the other constitutional symptoms. He felt mostly sore after last PT session with a little aching and pain but not to where it took him off his feet.   PAIN:  NPRS: 5/10 mostly in low back, right groin/hip, bilateral posterior thighs, right calf  PRECAUTIONS: Fall  PATIENT GOALS: "To get better, walk, drive again, climb stairs, play with my daughter, help around the house  again"  NEXT MD VISIT:   OBJECTIVE  TREATMENT Manual therapy: to reduce pain and tissue tension, improve range of motion, neuromodulation, in order to promote improved ability to complete functional activities. Hooklying R hip caudal/posterior glide with belt, 4x45 seconds, grade IIIV  Therapeutic exercise: to centralize symptoms and improve ROM, strength, muscular endurance, and activity tolerance required for successful completion of functional activities.   Seated overhead pull down (B shoulder extension from ~120 degrees flexion to bar on thighs) with cable bar  1x3 at 20# Discontinued due to  pain down leg with any attempt to pull down.   Seated CHEST press with  1x15 with 8.8# sled bar 2x15 with 23# Barbell   Seated lumbar flexion roll out with clear theraball to decrease pain after irritating activities.  2x 2.5 minutes   Therapeutic activities: dynamic therapeutic activities incorporating MULTIPLE parameters or areas of the body designed to achieve improved functional performance.   TRX sit <> stand from 17 inch grey chair  with airex pad + A2z pad on it 2x5  1x2 (discontinued due to increasing sharp pain in the right low back/thigh/leg) 1 min rest between sets CGA with assistance with positioning and stabilizing chair and TRX  Seated marching from elevated plinth (23.5 inches) while holding 15# slam ball in front of chest  1x6 each side Discontinued due to increasing sharp pain  Seated ball dribbles off each knee during march 2x10 each side with small green ball (able to bounce it back better than soccer ball)  Seated drop kicks to lower leg/foot of small green ball 1x10 each side Assisted by PT to return ball when it did not bounce back to pateint   Pt required multimodal cuing for proper technique and to facilitate improved neuromuscular control, strength, range of motion, and functional ability resulting in improved performance and form.   PATIENT EDUCATION:   Education details: Exercise purpose/form. Self management techniques.  Person educated: patient Education method: Explanation Education comprehension: verbalized understanding and needs further education  HOME EXERCISE PROGRAM: Verbally:  - seated lumbar flexion stretch with head down and one LE extended using rollator, 1x10 each side.  - curl up with legs elevated, 5x15 seconds - sidelying open book 1x10 each side  Access Code: [email or web address] URL: [email or web address] Date: 06/24/2023 Prepared by: Norton Blizzard  Exercises - Median Nerve Flossing - Tray  - 3 x daily - 10 reps - Seated Cervical Retraction  - 1-2 x daily - 3 sets - 10 reps  ASSESSMENT:  CLINICAL IMPRESSION:     Patient with elevated pain and decreased activity tolerance today. Continued with strengthening and conditioning exercises as tolerated with rest breaks utilizing lumbar flexion as needed to calm pain. Patient was limited by sharp shooting in the right low back into the hip and down the back, which is typical for his presentation. He continues to put forth good effort and requires assistance to complete exercises that are most beneficial to his function.  Patient would benefit from continued management of limiting condition by skilled physical therapist to address remaining impairments and functional limitations to work towards stated goals and return to PLOF or maximal functional independence.    OBJECTIVE IMPAIRMENTS: Abnormal gait, decreased activity tolerance, decreased balance, decreased coordination, decreased endurance, decreased knowledge of condition, decreased knowledge of use of DME, decreased mobility, difficulty walking, decreased ROM, decreased strength, increased edema, impaired perceived functional ability, increased muscle spasms, impaired flexibility, impaired sensation, impaired tone, improper body mechanics, postural dysfunction, obesity, and pain.   ACTIVITY LIMITATIONS: carrying,  lifting, bending, standing, squatting, stairs, transfers, bed mobility, continence, bathing, toileting, dressing, hygiene/grooming, locomotion level, and caring for others  PARTICIPATION LIMITATIONS: meal prep, cleaning, laundry, interpersonal relationship, driving, shopping, community activity, occupation, yard work, and   difficulty with or unable to complete any activity that requires weight bearing, use of B LE, and/or balance including working, household and community mobility, walking, driving, going to family gatherings, avoiding incontinent episodes, playing with daughter, helping around the house, bed mobility, transfers  PERSONAL FACTORS: Past/current experiences, Time since onset  of injury/illness/exacerbation, and 3+ comorbidities:   3 thoracic spine surgeries, thoracic MRI notes "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 Franky Macho on 05/25/2022, adhesive arachnoiditis, nerve pain are also affecting patient's functional outcome.   REHAB POTENTIAL: Fair due to severity and nature of condition.   CLINICAL DECISION MAKING: Evolving/moderate complexity  EVALUATION COMPLEXITY: Moderate   GOALS: Goals reviewed with patient? No  SHORT TERM GOALS: Target date: 01/09/2023. Target date updated to 05/28/2023 for all unmet goals on 03/05/2023.   Patient will be independent with initial home exercise program for self-management of symptoms. Baseline: Initial HEP to be provided at visit 2 as appropriate (12/26/22); Goal status: MET   LONG TERM GOALS: Target date: 03/20/2023. Target date updated to 08/26/23  for all unmet goals on 06/03/2023. Target date updated to 11/20/2023 for all   unmet goals on 08/28/2023.   Patient will be independent with a long-term home exercise program for self-management of symptoms.  Baseline: Initial HEP to be provided at visit 2 as appropriate (12/26/22); participating as able (01/31/2023); patient currently participating as tolerated (03/05/2023; 04/09/2023; 06/03/2023); participates daily in mat exercises and in weight bearing exercises as tolerated (10/08/2023);  Goal status: In-progress  2.  Patient will demonstrate improved FOTO by equal or greater than 10 points by visit #13 to demonstrate improvement in overall condition and self-reported functional ability.  Baseline: to be tested visit 2 as appropriate (12/26/22); 30 at visit #3 (01/02/2023); 46 at visit #13 (03/05/2023); 45 at visit #29 (06/03/2023); 49 at visit #50 (10/08/2023);  Goal status: MET  3.  Patient will demonstrate the ability to ambulate equal or greater than 600 feet with LRAD during the 6 minute walk to improve his household and community mobility.  Baseline: 34 feet with bari-RW (12/26/22); 200 feet with bari-RW (01/31/2023); 500 feet with BRW (03/05/2023); 400 feet with BRW (04/09/2023); 152 feet with BRW and W/C follow for safety (06/03/2023); 330 feet with BRW (10/08/2023);  Goal status: Ongoing  4.  Patient will complete 5 Time Sit To Stand Test from 19.5 inch surface or lower in equal or less than 15 seconds with no UE support to demonstrate improved transfer ability for improved household and toileting mobility.  Baseline: 23 seconds with heavy B UE support on RW from 19.5 inch plinth. Pain throbbing down the right LE.  (12/26/22); 19 seconds with heavy B UE support on RW from 19.5 inch plinth (03/05/2023); 20 seconds with heavy B UE support on RRW from 18.5 inch plinth. Painful in back of B calves, thighs, and lower back (04/09/2023); 15 seconds with heavy B UE support on BRW from 18.5 inch plinth. Painful in back of B calves, thighs, and lower back (06/03/2023); 19 seconds with  heavy B UE support on BRW from 18.5 inch plinth. Painful in back of B calves, thighs, and lower back (10/08/2023);  Goal status: Ongoing  5.  Patient will report worst pain equal or less than 3/10 with functional activities to improve his ability to complete basic household and community mobility. Baseline: up to 8/10 (12/26/22); reports 3/10 pain (01/31/2023); reports 4/10 pain (02/19/2023); up to 7/10 in the last 2 week s(03/05/2023): up to  7/10 in the last two weeks (04/09/2023); up to 8/10  in the last 2 weeks (06/03/2023);  up to 9/10 over the last 2 weeks (10/08/2023);  Goal status: Ongoing  6.  Patient will demonstrate improvement in Patient Specific Functional Scale (PSFS) by equal or greater than 3/10 points to reflect clinically significant improvement in patient's most valued functional activities. Baseline: 1.7/10 for Walking Around [address], [place], and Driving ([birth year]); Goal status: NEW    PLAN:  PT FREQUENCY: 1-2x/week  PT DURATION: 12 weeks  PLANNED INTERVENTIONS: Therapeutic exercises, Therapeutic activity, Neuromuscular re-education, Balance training, Gait training, Patient/Family education, Self Care, Joint mobilization, Stair training, Orthotic/Fit training, DME instructions, Aquatic Therapy, Dry Needling, Electrical stimulation, Wheelchair mobility training, Spinal mobilization, Cryotherapy, Moist heat, Manual therapy, and Re-evaluation.  PLAN FOR NEXT SESSION:  update HEP as appropriate, neurodynamics, gait training, LE/core/functional strengthening, stretching, and balance as tolerated, education, manual therapy as needed.   Luretha Murphy. Ilsa Iha, PT, DPT 11/11/23, 8:02 PM  [facility] [address] [place], [place] [address] P: [phone] I F: [phone]

## 2023-11-13 ENCOUNTER — Ambulatory Visit: Payer: Medicare Other | Admitting: Physical Therapy

## 2023-11-18 ENCOUNTER — Ambulatory Visit: Payer: Medicare Other | Attending: Physical Medicine & Rehabilitation | Admitting: Physical Therapy

## 2023-11-18 ENCOUNTER — Encounter: Payer: Self-pay | Admitting: Physical Therapy

## 2023-11-18 DIAGNOSIS — R296 Repeated falls: Secondary | ICD-10-CM | POA: Diagnosis present

## 2023-11-18 DIAGNOSIS — R262 Difficulty in walking, not elsewhere classified: Secondary | ICD-10-CM | POA: Insufficient documentation

## 2023-11-18 DIAGNOSIS — M5459 Other low back pain: Secondary | ICD-10-CM | POA: Diagnosis present

## 2023-11-18 DIAGNOSIS — R29818 Other symptoms and signs involving the nervous system: Secondary | ICD-10-CM | POA: Diagnosis present

## 2023-11-18 DIAGNOSIS — M5416 Radiculopathy, lumbar region: Secondary | ICD-10-CM | POA: Diagnosis present

## 2023-11-18 DIAGNOSIS — M6281 Muscle weakness (generalized): Secondary | ICD-10-CM | POA: Diagnosis present

## 2023-11-18 NOTE — Therapy (Signed)
 OUTPATIENT PHYSICAL THERAPY TREATMENT   Patient Name: Travis Fitzpatrick MRN: [id number] DOB:1980-12-12, 43 y.o., male Today's Date: 11/18/23   END OF SESSION:  PT End of Session - 11/18/23 1608     Visit Number 57    Number of Visits 63    Date for PT Re-Evaluation 11/20/23    Authorization Type MEDICARE PART B reporting period from 10/08/2023    Progress Note Due on Visit 60    PT Start Time 1607    PT Stop Time 1645    PT Time Calculation (min) 38 min    Activity Tolerance Patient limited by pain;Patient tolerated treatment well    Behavior During Therapy [facility] for tasks assessed/performed                  Past Medical History:  Diagnosis Date   Bowel trouble    urgency   Medical history non-contributory    Urinary urgency    Past Surgical History:  Procedure Laterality Date   BACK SURGERY  2010   CIRCUMCISION     LUMBAR LAMINECTOMY/DECOMPRESSION MICRODISCECTOMY  07/20/2011   Procedure: LUMBAR LAMINECTOMY/DECOMPRESSION MICRODISCECTOMY;  Surgeon: Carmela Hurt;  Location: [facility];  Service: Neurosurgery;  Laterality: N/A;  right thoracotomy with thoracic eight-nine discectomy and fusion   LUMBAR LAMINECTOMY/DECOMPRESSION MICRODISCECTOMY Right 10/06/2021   Procedure: Right Lumbar Four-Five Microdiscectomy, Right Lumbar Five-Sacal One Foraminotomy;  Surgeon: Coletta Memos, MD;  Location: [facility];  Service: Neurosurgery;  Laterality: Right;  3C/RM 21   THORACIC DISCECTOMY  07/16/2012   Procedure: THORACIC DISCECTOMY;  Surgeon: Carmela Hurt, MD;  Location: [facility];  Service: Neurosurgery;  Laterality: Right;  RIGHT Thoracic seven-eight  thoracic diskectomy via thoracotomy by dr Laneta Simmers   THORACIC DISCECTOMY N/A 12/15/2014   Procedure: THORACIC SEVEN TO THORACIC NINE Laminectomy ;  Surgeon: Coletta Memos, MD;  Location: [facility];  Service: Neurosurgery;  Laterality: N/A;   THORACIC DISCECTOMY N/A 06/06/2017   Procedure: LAMINECTOMY THORACIC NINE-TEN;  Surgeon:  Coletta Memos, MD;  Location: [facility];  Service: Neurosurgery;  Laterality: N/A;  LAMINECTOMY THORACIC NINE-TEN   THORACOTOMY  07/20/2011   Procedure: THORACOTOMY OPEN FOR SPINE SURGERY;  Surgeon: Norton Blizzard, MD;  Location: [facility];  Service: Vascular;  Laterality: N/A;   THORACOTOMY  07/16/2012   Procedure: THORACOTOMY OPEN FOR SPINE SURGERY;  Surgeon: Alleen Borne, MD;  Location: [facility];  Service: Thoracic;  Laterality: N/A;   Patient Active Problem List   Diagnosis Date Noted   Spasticity 09/25/2023   Nerve pain 12/05/2022   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   S/P lumbar spinal fusion 05/25/2022   Synovial cyst of lumbar facet joint 05/25/2022   HNP (herniated nucleus pulposus), lumbar 10/06/2021   Hand pain 06/28/2021   Lumbar facet arthropathy 01/11/2021   Thoracic spondylosis with myelopathy 01/11/2021   Thoracic spinal stenosis 06/06/2017   Stenosis, spinal, thoracic 12/15/2014   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 09/22/2014   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/2011    PCP: Nira Retort  REFERRING PROVIDER: Ranelle Oyster, MD  REFERRING DIAG: thoracic disc disease with myelopathy, nerve pain, adhesive arachnoiditis  Rationale for Evaluation and Treatment: Rehabilitation  THERAPY DIAG:  Other low back pain  Other symptoms and signs involving the nervous system  Radiculopathy, lumbar region  Muscle weakness (generalized)  Repeated falls  Difficulty in walking, not elsewhere classified  ONSET DATE:  Suddenly started having weakness 11 years ago, most recent  episode of worsening October 2022, s/p right L4-5 laminotomy, microdiscectomy on 10/06/2021, now s/p L4-5 PLIF by Dr. Franky Macho on 05/25/2022.  PERTINENT HISTORY:  Patient is a 43 y.o. male who presents to outpatient physical therapy with a referral for medical diagnosis thoracic disc disease with myelopathy, nerve pain, adhesive arachnoiditis. This  patient's chief complaints consist of disabling low back and R >L leg pain and weakness with activity, bowel and bladder urgency, incontinence, and retention, leading to the following functional deficits: difficulty with or unable to complete any activity that requires weight bearing, use of B LE, and/or balance including working, household and community mobility, walking, driving, going to family gatherings, avoiding incontinent episodes, playing with daughter, helping around the house, bed mobility, transfers. 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 Franky Macho on 05/25/2022, adhesive arachnoiditis, nerve pain.  Patient denies hx of cancer, stroke, seizures, lung problems, heart problems, diabetes, unexplained weight loss, and osteoporosis.  SUBJECTIVE:                                                                                                                                                                                           SUBJECTIVE STATEMENT:     Patient states he is feeling better than last week. He thinks last week was just a rough week. He was unable to attend his second PT visit last week due to increased symptoms.   PAIN:  NPRS: 2/10 mostly in low back, right groin/hip, bilateral posterior thighs, right calf  PRECAUTIONS: Fall  PATIENT GOALS: "To get better, walk, drive again, climb stairs, play with my daughter, help around the house again"  NEXT MD VISIT:   OBJECTIVE  TREATMENT Manual therapy: to reduce pain and tissue tension, improve range of motion,  neuromodulation, in order to promote improved ability to complete functional activities. Hooklying R hip caudal/posterior glide with belt, 4x45 seconds, grade IIIV  Therapeutic activities: dynamic therapeutic activities incorporating MULTIPLE parameters or areas of the body designed to achieve improved functional performance.  Seated overhead pull down (B shoulder extension from ~120 degrees flexion to bar on thighs) with cable bar  3x20 at 20# With diaphragmatic breathing coordination.   Neuromuscular Re-education: a technique or exercise performed with the goal of improving the level of communication between the body and  the brain, such as for balance, motor control, muscle activation patterns, coordination, desensitization, quality of muscle contraction, proprioception, and/or kinesthetic sense needed for successful and safe completion of functional activities.   Breathing drills in hookling and seated position to improve coordination of core muscles to better be able to support back. Diaphragmatic breathing focus with moveemnt in belly, and ribs (unable).     Pt required multimodal cuing for proper technique and to facilitate improved neuromuscular control, strength, range of motion, and functional ability resulting in improved performance and form.   PATIENT EDUCATION:  Education details: Exercise purpose/form. Self management techniques.  Person educated: patient Education method: Explanation Education comprehension: verbalized understanding and needs further education  HOME EXERCISE PROGRAM: Verbally:  - seated lumbar flexion stretch with head down and one LE extended using rollator, 1x10 each side.  - curl up with legs elevated, 5x15 seconds - sidelying open book 1x10 each side  Access Code: [email or web address] URL: [email or web address] Date: 11/18/2023 Prepared by: Norton Blizzard  Exercises - Supine Diaphragmatic Breathing  - 2-3 x daily - 2 sets - 10 breaths - Seated  Diaphragmatic Breathing  - 3 reps - 1 minute practice time  ASSESSMENT:  CLINICAL IMPRESSION:     Patient with improved pain today. Focused on diaphragmatic breathing to improve core coordination for active support of spine during functional activities. Patient with paradoxical breathing but able to perform better with concentration and practice. He needed UE support to perform diaphragmatic breathing in upright position and was unable to perform 360 breathing in hooklying. Patient would benefit from continued management of limiting condition by skilled physical therapist to address remaining impairments and functional limitations to work towards stated goals and return to PLOF or maximal functional independence.     OBJECTIVE IMPAIRMENTS: Abnormal gait, decreased activity tolerance, decreased balance, decreased coordination, decreased endurance, decreased knowledge of condition, decreased knowledge of use of DME, decreased mobility, difficulty walking, decreased ROM, decreased strength, increased edema, impaired perceived functional ability, increased muscle spasms, impaired flexibility, impaired sensation, impaired tone, improper body mechanics, postural dysfunction, obesity, and pain.   ACTIVITY LIMITATIONS: carrying, lifting, bending, standing, squatting, stairs, transfers, bed mobility, continence, bathing, toileting, dressing, hygiene/grooming, locomotion level, and caring for others  PARTICIPATION LIMITATIONS: meal prep, cleaning, laundry, interpersonal relationship, driving, shopping, community activity, occupation, yard work, and   difficulty with or unable to complete any activity that requires weight bearing, use of B LE, and/or balance including working, household and community mobility, walking, driving, going to family gatherings, avoiding incontinent episodes, playing with daughter, helping around the house, bed mobility, transfers  PERSONAL FACTORS: Past/current experiences, Time since  onset of injury/illness/exacerbation, and 3+ comorbidities:   3 thoracic spine surgeries, thoracic MRI notes "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 Franky Macho on 05/25/2022, adhesive arachnoiditis, nerve pain are also affecting patient's functional outcome.   REHAB POTENTIAL: Fair due to severity and nature of condition.   CLINICAL DECISION MAKING: Evolving/moderate complexity  EVALUATION COMPLEXITY: Moderate   GOALS: Goals reviewed with patient? No  SHORT TERM GOALS: Target date: 01/09/2023. Target date updated to 05/28/2023 for all unmet goals on 03/05/2023.   Patient will be independent with initial home exercise program for self-management of symptoms. Baseline: Initial HEP to be  provided at visit 2 as appropriate (12/26/22); Goal status: MET   LONG TERM GOALS: Target date: 03/20/2023. Target date updated to 08/26/23  for all unmet goals on 06/03/2023. Target date updated to 11/20/2023 for all  unmet goals on 08/28/2023.   Patient will be independent with a long-term home exercise program for self-management of symptoms.  Baseline: Initial HEP to be provided at visit 2 as appropriate (12/26/22); participating as able (01/31/2023); patient currently participating as tolerated (03/05/2023; 04/09/2023; 06/03/2023); participates daily in mat exercises and in weight bearing exercises as tolerated (10/08/2023);  Goal status: In-progress  2.  Patient will demonstrate improved FOTO by equal or greater than 10 points by visit #13 to demonstrate improvement in overall condition and self-reported functional ability.  Baseline: to be tested visit 2 as appropriate (12/26/22); 30 at visit #3  (01/02/2023); 46 at visit #13 (03/05/2023); 45 at visit #29 (06/03/2023); 49 at visit #50 (10/08/2023);  Goal status: MET  3.  Patient will demonstrate the ability to ambulate equal or greater than 600 feet with LRAD during the 6 minute walk to improve his household and community mobility.  Baseline: 34 feet with bari-RW (12/26/22); 200 feet with bari-RW (01/31/2023); 500 feet with BRW (03/05/2023); 400 feet with BRW (04/09/2023); 152 feet with BRW and W/C follow for safety (06/03/2023); 330 feet with BRW (10/08/2023);  Goal status: Ongoing  4.  Patient will complete 5 Time Sit To Stand Test from 19.5 inch surface or lower in equal or less than 15 seconds with no UE support to demonstrate improved transfer ability for improved household and toileting mobility.  Baseline: 23 seconds with heavy B UE support on RW from 19.5 inch plinth. Pain throbbing down the right LE.  (12/26/22); 19 seconds with heavy B UE support on RW from 19.5 inch plinth (03/05/2023); 20 seconds with heavy B UE support on RRW from 18.5 inch plinth. Painful in back of B calves, thighs, and lower back (04/09/2023); 15 seconds with heavy B UE support on BRW from 18.5 inch plinth. Painful in back of B calves, thighs, and lower back (06/03/2023); 19 seconds with heavy B UE support on BRW from 18.5 inch plinth. Painful in back of B calves, thighs, and lower back (10/08/2023);  Goal status: Ongoing  5.  Patient will report worst pain equal or less than 3/10 with functional activities to improve his ability to complete basic household and community mobility. Baseline: up to 8/10 (12/26/22); reports 3/10 pain (01/31/2023); reports 4/10 pain (02/19/2023); up to 7/10 in the last 2 week s(03/05/2023): up to  7/10 in the last two weeks (04/09/2023); up to 8/10 in the last 2 weeks (06/03/2023);  up to 9/10 over the last 2 weeks (10/08/2023);  Goal status: Ongoing  6.  Patient will demonstrate improvement in Patient Specific Functional Scale (PSFS) by equal or  greater than 3/10 points to reflect clinically significant improvement in patient's most valued functional activities. Baseline: 1.7/10 for Walking Around [address], [place], and Driving (16/10/[birth year]); Goal status: NEW    PLAN:  PT FREQUENCY: 1-2x/week  PT DURATION: 12 weeks  PLANNED INTERVENTIONS: Therapeutic exercises, Therapeutic activity, Neuromuscular re-education, Balance training, Gait training, Patient/Family education, Self Care, Joint mobilization, Stair training, Orthotic/Fit training, DME instructions, Aquatic Therapy, Dry Needling, Electrical stimulation, Wheelchair mobility training, Spinal mobilization, Cryotherapy, Moist heat, Manual therapy, and Re-evaluation.  PLAN FOR NEXT SESSION:  update HEP as appropriate, neurodynamics, gait training, LE/core/functional strengthening, stretching, and balance as tolerated, education, manual therapy as needed.   Luretha Murphy.  Ilsa Iha, PT, DPT 11/18/23, 7:42 PM  [facility] [address] [place], [place] [address] P: [phone] I F: [phone]

## 2023-11-20 ENCOUNTER — Ambulatory Visit: Payer: Medicare Other | Admitting: Physical Therapy

## 2023-11-20 ENCOUNTER — Encounter: Payer: Self-pay | Admitting: Physical Therapy

## 2023-11-20 DIAGNOSIS — M5459 Other low back pain: Secondary | ICD-10-CM | POA: Diagnosis not present

## 2023-11-20 DIAGNOSIS — M6281 Muscle weakness (generalized): Secondary | ICD-10-CM

## 2023-11-20 DIAGNOSIS — R262 Difficulty in walking, not elsewhere classified: Secondary | ICD-10-CM

## 2023-11-20 DIAGNOSIS — R296 Repeated falls: Secondary | ICD-10-CM

## 2023-11-20 DIAGNOSIS — R29818 Other symptoms and signs involving the nervous system: Secondary | ICD-10-CM

## 2023-11-20 DIAGNOSIS — M5416 Radiculopathy, lumbar region: Secondary | ICD-10-CM

## 2023-11-20 NOTE — Therapy (Signed)
 OUTPATIENT PHYSICAL THERAPY TREATMENT   Patient Name: Travis Fitzpatrick MRN: [id number] DOB:Aug 18, 1980, 43 y.o., male Today's Date: 11/20/23   END OF SESSION:  PT End of Session - 11/20/23 2143     Visit Number 58    Number of Visits 63    Date for PT Re-Evaluation 11/20/23    Authorization Type MEDICARE PART B reporting period from 10/08/2023    Progress Note Due on Visit 60    PT Start Time 1606    PT Stop Time 1647    PT Time Calculation (min) 41 min    Activity Tolerance Patient limited by pain;Patient tolerated treatment well    Behavior During Therapy [facility] for tasks assessed/performed                   Past Medical History:  Diagnosis Date   Bowel trouble    urgency   Medical history non-contributory    Urinary urgency    Past Surgical History:  Procedure Laterality Date   BACK SURGERY  2010   CIRCUMCISION     LUMBAR LAMINECTOMY/DECOMPRESSION MICRODISCECTOMY  07/20/2011   Procedure: LUMBAR LAMINECTOMY/DECOMPRESSION MICRODISCECTOMY;  Surgeon: Carmela Hurt;  Location: [facility];  Service: Neurosurgery;  Laterality: N/A;  right thoracotomy with thoracic eight-nine discectomy and fusion   LUMBAR LAMINECTOMY/DECOMPRESSION MICRODISCECTOMY Right 10/06/2021   Procedure: Right Lumbar Four-Five Microdiscectomy, Right Lumbar Five-Sacal One Foraminotomy;  Surgeon: Coletta Memos, MD;  Location: [facility];  Service: Neurosurgery;  Laterality: Right;  3C/RM 21   THORACIC DISCECTOMY  07/16/2012   Procedure: THORACIC DISCECTOMY;  Surgeon: Carmela Hurt, MD;  Location: [facility];  Service: Neurosurgery;  Laterality: Right;  RIGHT Thoracic seven-eight  thoracic diskectomy via thoracotomy by dr Laneta Simmers   THORACIC DISCECTOMY N/A 12/15/2014   Procedure: THORACIC SEVEN TO THORACIC NINE Laminectomy ;  Surgeon: Coletta Memos, MD;  Location: [facility];  Service: Neurosurgery;  Laterality: N/A;   THORACIC DISCECTOMY N/A 06/06/2017   Procedure: LAMINECTOMY THORACIC NINE-TEN;  Surgeon:  Coletta Memos, MD;  Location: [facility];  Service: Neurosurgery;  Laterality: N/A;  LAMINECTOMY THORACIC NINE-TEN   THORACOTOMY  07/20/2011   Procedure: THORACOTOMY OPEN FOR SPINE SURGERY;  Surgeon: Norton Blizzard, MD;  Location: [facility];  Service: Vascular;  Laterality: N/A;   THORACOTOMY  07/16/2012   Procedure: THORACOTOMY OPEN FOR SPINE SURGERY;  Surgeon: Alleen Borne, MD;  Location: [facility];  Service: Thoracic;  Laterality: N/A;   Patient Active Problem List   Diagnosis Date Noted   Spasticity 09/25/2023   Nerve pain 12/05/2022   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   S/P lumbar spinal fusion 05/25/2022   Synovial cyst of lumbar facet joint 05/25/2022   HNP (herniated nucleus pulposus), lumbar 10/06/2021   Hand pain 06/28/2021   Lumbar facet arthropathy 01/11/2021   Thoracic spondylosis with myelopathy 01/11/2021   Thoracic spinal stenosis 06/06/2017   Stenosis, spinal, thoracic 12/15/2014   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 09/22/2014   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/2011    PCP: Nira Retort  REFERRING PROVIDER: Ranelle Oyster, MD  REFERRING DIAG: thoracic disc disease with myelopathy, nerve pain, adhesive arachnoiditis  Rationale for Evaluation and Treatment: Rehabilitation  THERAPY DIAG:  Other low back pain  Other symptoms and signs involving the nervous system  Radiculopathy, lumbar region  Muscle weakness (generalized)  Repeated falls  Difficulty in walking, not elsewhere classified  ONSET DATE:  Suddenly started having weakness 11 years ago, most  recent episode of worsening October 2022, s/p right L4-5 laminotomy, microdiscectomy on 10/06/2021, now s/p L4-5 PLIF by Dr. Franky Macho on 05/25/2022.  PERTINENT HISTORY:  Patient is a 43 y.o. male who presents to outpatient physical therapy with a referral for medical diagnosis thoracic disc disease with myelopathy, nerve pain, adhesive arachnoiditis. This  patient's chief complaints consist of disabling low back and R >L leg pain and weakness with activity, bowel and bladder urgency, incontinence, and retention, leading to the following functional deficits: difficulty with or unable to complete any activity that requires weight bearing, use of B LE, and/or balance including working, household and community mobility, walking, driving, going to family gatherings, avoiding incontinent episodes, playing with daughter, helping around the house, bed mobility, transfers. 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 Franky Macho on 05/25/2022, adhesive arachnoiditis, nerve pain.  Patient denies hx of cancer, stroke, seizures, lung problems, heart problems, diabetes, unexplained weight loss, and osteoporosis.  SUBJECTIVE:                                                                                                                                                                                           SUBJECTIVE STATEMENT:     Patient states he is feeling well today and felt a little better after last PT session. He practiced his breathing laying down and with his hands on the RW but he is still finding it hard to perform without UE support.   PAIN:  NPRS: 2/10 mostly in low back, right groin/hip, bilateral posterior thighs, right calf  PRECAUTIONS: Fall  PATIENT GOALS: "To get better, walk, drive again, climb stairs, play with my daughter, help around the house again"  NEXT MD VISIT:   OBJECTIVE  TREATMENT Manual therapy: to reduce pain and  tissue tension, improve range of motion, neuromodulation, in order to promote improved ability to complete functional activities. Hooklying R hip caudal/posterior glide with belt, 3x45 seconds, grade IV  Therapeutic exercise: therapeutic exercises that incorporate ONE parameter at one or more areas of the body to centralize symptoms, develop strength and endurance, range of motion, and flexibility required for successful completion of functional activities.  For improved muscular endurance, and activity tolerance; and to induce the analgesic effect of aerobic exercise, stimulate improved joint nutrition, work towards meeting  physical activity guidelines for health.   NuStep level  using bilateral upper and lower extremities. Seat/handle setting 11/11.  1x4 min  Level 7, SPM goal: >100 Tried other configurations but that seemed to get best CV response Discontinued due to increasing R groin pain with each press through the R LE  Upper body ergometer level 32 1 min forwards 1 min backwards Too hard on shoulders so discontinued Attempted with LE on lower pedals but too painful in R groin  NuStep level  using bilateral upper and L  lower extremity only (R propped on stool). Seat/handle setting 12/12.  1x1 min  Level 6, SPM goal: >100 Discontinued due to shoulder fatigue Also dyspnea   Seated lumbar flexion roll out   2 minutes self selected pace   Neuromuscular Re-education: a technique or exercise performed with the goal of improving the level of communication between the body and the brain, such as for balance, motor control, muscle activation patterns, coordination, desensitization, quality of muscle contraction, proprioception, and/or kinesthetic sense needed for successful and safe completion of functional activities.   Seated diaphragmatic breathing with hands on knees 1x20 with neck flexed to prevent increased pain pt was having with head up Improved motor control since last  session.   Seated overhead pull down (B shoulder extension from ~120 degrees flexion to bar on thighs) with cable bar  2x20 at 25# With diaphragmatic breathing coordination.  Pauses and isloated diaphragmatic breathing to get coordination    Pt required multimodal cuing for proper technique and to facilitate improved neuromuscular control, strength, range of motion, and functional ability resulting in improved performance and form.   PATIENT EDUCATION:  Education details: Exercise purpose/form. Self management techniques.  Person educated: patient Education method: Explanation Education comprehension: verbalized understanding and needs further education  HOME EXERCISE PROGRAM: Verbally:  - seated lumbar flexion stretch with head down and one LE extended using rollator, 1x10 each side.  - curl up with legs elevated, 5x15 seconds - sidelying open book 1x10 each side  Access Code: [email or web address] URL: [email or web address] Date: 11/18/2023 Prepared by: Norton Blizzard  Exercises - Supine Diaphragmatic Breathing  - 2-3 x daily - 2 sets - 10 breaths - Seated Diaphragmatic Breathing  - 3 reps - 1 minute practice time  ASSESSMENT:  CLINICAL IMPRESSION:     Patient with continued lower level of pain. Attempted to incorporate aerobic exercises with goal to achieve moderate to vigorous intensity to help patient work towards meeting physical activity guidelines for health and benefit from analgesic and metabolic effect of aerobic exercise. Patient was limited by his right groin pain with R LE extension and heavy fatigue in B UE. He demonstrated improved diaphragmatic breathing but had difficulty coordinating it with pull down exercise today. Plan to complete re-certification/progress note next session. Patient would benefit from continued management of limiting condition by skilled physical therapist to address remaining impairments and functional limitations to work towards stated goals  and return to PLOF or maximal functional independence.      OBJECTIVE IMPAIRMENTS: Abnormal gait, decreased activity tolerance, decreased balance, decreased coordination, decreased endurance, decreased knowledge of condition, decreased knowledge of use of DME, decreased mobility, difficulty walking, decreased ROM, decreased strength, increased edema, impaired perceived functional ability, increased muscle spasms, impaired flexibility, impaired sensation, impaired tone, improper body mechanics, postural dysfunction, obesity, and pain.   ACTIVITY LIMITATIONS: carrying, lifting, bending, standing, squatting, stairs, transfers, bed mobility, continence, bathing, toileting, dressing, hygiene/grooming, locomotion level, and caring for others  PARTICIPATION LIMITATIONS: meal  prep, cleaning, laundry, interpersonal relationship, driving, shopping, community activity, occupation, yard work, and   difficulty with or unable to complete any activity that requires weight bearing, use of B LE, and/or balance including working, household and community mobility, walking, driving, going to family gatherings, avoiding incontinent episodes, playing with daughter, helping around the house, bed mobility, transfers  PERSONAL FACTORS: Past/current experiences, Time since onset of injury/illness/exacerbation, and 3+ comorbidities:   3 thoracic spine surgeries, thoracic MRI notes "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 Franky Macho on 05/25/2022, adhesive arachnoiditis, nerve pain are also affecting patient's functional outcome.   REHAB POTENTIAL: Fair due to severity and nature of condition.   CLINICAL DECISION  MAKING: Evolving/moderate complexity  EVALUATION COMPLEXITY: Moderate   GOALS: Goals reviewed with patient? No  SHORT TERM GOALS: Target date: 01/09/2023. Target date updated to 05/28/2023 for all unmet goals on 03/05/2023.   Patient will be independent with initial home exercise program for self-management of symptoms. Baseline: Initial HEP to be provided at visit 2 as appropriate (12/26/22); Goal status: MET   LONG TERM GOALS: Target date: 03/20/2023. Target date updated to 08/26/23  for all unmet goals on 06/03/2023. Target date updated to 11/20/2023 for all  unmet goals on 08/28/2023.   Patient will be independent with a long-term home exercise program for self-management of symptoms.  Baseline: Initial HEP to be provided at visit 2 as appropriate (12/26/22); participating as able (01/31/2023); patient currently participating as tolerated (03/05/2023; 04/09/2023; 06/03/2023); participates daily in mat exercises and in weight bearing exercises as tolerated (10/08/2023);  Goal status: In-progress  2.  Patient will demonstrate improved FOTO by equal or greater than 10 points by visit #13 to demonstrate improvement in overall condition and self-reported functional ability.  Baseline: to be tested visit 2 as appropriate (12/26/22); 30 at visit #3 (01/02/2023); 46 at visit #13 (03/05/2023); 45 at visit #29 (06/03/2023); 49 at visit #50 (10/08/2023);  Goal status: MET  3.  Patient will demonstrate the ability to ambulate equal or greater than 600 feet with LRAD during the 6 minute walk to improve his household and community mobility.  Baseline: 34 feet with bari-RW (12/26/22); 200 feet with bari-RW (01/31/2023); 500 feet with BRW (03/05/2023); 400 feet with BRW (04/09/2023); 152 feet with BRW and W/C follow for safety (06/03/2023); 330 feet with BRW (10/08/2023);  Goal status: Ongoing  4.  Patient will complete 5 Time Sit To Stand Test from 19.5 inch surface or lower in equal or less than 15 seconds with no  UE support to demonstrate improved transfer ability for improved household and toileting mobility.  Baseline: 23 seconds with heavy B UE support on RW from 19.5 inch plinth. Pain throbbing down the right LE.  (12/26/22); 19 seconds with heavy B UE support on RW from 19.5 inch plinth (03/05/2023); 20 seconds with heavy B UE support on RRW from 18.5 inch plinth. Painful in back of B calves, thighs, and lower back (04/09/2023); 15 seconds with heavy B UE support on BRW from 18.5 inch plinth. Painful in back of B calves, thighs, and lower back (06/03/2023); 19 seconds with heavy B UE support on BRW from 18.5 inch plinth. Painful in back of B calves, thighs, and lower back (10/08/2023);  Goal status: Ongoing  5.  Patient will report worst pain equal or less than 3/10 with functional activities to improve his ability to complete basic household and community mobility. Baseline: up to 8/10 (12/26/22); reports 3/10 pain (01/31/2023); reports 4/10 pain (02/19/2023); up to 7/10 in the last 2 week s(03/05/2023): up to  7/10 in the last two weeks (04/09/2023); up to 8/10 in the last 2 weeks (06/03/2023);  up to 9/10 over the last 2 weeks (10/08/2023);  Goal status: Ongoing  6.  Patient will demonstrate improvement in Patient Specific Functional Scale (PSFS) by equal or greater than 3/10 points to reflect clinically significant improvement in patient's most valued functional activities. Baseline: 1.7/10 for Walking Around [address], [place], and Driving (16/10[birth year]); Goal status: NEW    PLAN:  PT FREQUENCY: 1-2x/week  PT DURATION: 12 weeks  PLANNED INTERVENTIONS: Therapeutic exercises, Therapeutic activity, Neuromuscular re-education, Balance training, Gait training, Patient/Family education, Self Care, Joint mobilization, Stair training, Orthotic/Fit training, DME instructions, Aquatic Therapy, Dry Needling, Electrical stimulation, Wheelchair mobility training, Spinal mobilization, Cryotherapy, Moist heat, Manual  therapy, and Re-evaluation.  PLAN FOR NEXT SESSION:  update HEP as appropriate, neurodynamics, gait training, LE/core/functional strengthening, stretching, and balance as tolerated, education, manual therapy as needed.   Luretha Murphy. Ilsa Iha, PT, DPT 11/20/23, 9:47 PM  [facility] [address] [place], [place] [address] P: [phone] I F: [phone]

## 2023-11-25 ENCOUNTER — Ambulatory Visit: Payer: Medicare Other | Admitting: Physical Therapy

## 2023-11-25 DIAGNOSIS — M5459 Other low back pain: Secondary | ICD-10-CM

## 2023-11-25 DIAGNOSIS — R29818 Other symptoms and signs involving the nervous system: Secondary | ICD-10-CM

## 2023-11-25 DIAGNOSIS — M6281 Muscle weakness (generalized): Secondary | ICD-10-CM

## 2023-11-25 DIAGNOSIS — R262 Difficulty in walking, not elsewhere classified: Secondary | ICD-10-CM

## 2023-11-25 DIAGNOSIS — R296 Repeated falls: Secondary | ICD-10-CM

## 2023-11-25 DIAGNOSIS — M5416 Radiculopathy, lumbar region: Secondary | ICD-10-CM

## 2023-11-25 NOTE — Therapy (Unsigned)
 OUTPATIENT PHYSICAL THERAPY TREATMENT / PROGRESS NOTE / RE-CERTIFICATION Dates of reporting from 10/08/2023 to 11/25/2023  Patient Name: Travis Fitzpatrick MRN: [id number] DOB:[birth year], 43 y.o., male Today's Date: 11/26/23   END OF SESSION:  PT End of Session - 11/25/23 1555     Visit Number 59    Number of Visits 63    Date for PT Re-Evaluation 02/17/24    Authorization Type MEDICARE PART B reporting period from 10/08/2023    Progress Note Due on Visit 60    PT Start Time 1600    PT Stop Time 1640    PT Time Calculation (min) 40 min    Activity Tolerance Patient limited by pain;Patient tolerated treatment well    Behavior During Therapy [facility] for tasks assessed/performed               Past Medical History:  Diagnosis Date   Bowel trouble    urgency   Medical history non-contributory    Urinary urgency    Past Surgical History:  Procedure Laterality Date   BACK SURGERY  2010   CIRCUMCISION     LUMBAR LAMINECTOMY/DECOMPRESSION MICRODISCECTOMY  07/20/2011   Procedure: LUMBAR LAMINECTOMY/DECOMPRESSION MICRODISCECTOMY;  Surgeon: Pasty Bongo;  Location: [facility];  Service: Neurosurgery;  Laterality: N/A;  right thoracotomy with thoracic eight-nine discectomy and fusion   LUMBAR LAMINECTOMY/DECOMPRESSION MICRODISCECTOMY Right 10/06/2021   Procedure: Right Lumbar Four-Five Microdiscectomy, Right Lumbar Five-Sacal One Foraminotomy;  Surgeon: Audie Bleacher, MD;  Location: [facility];  Service: Neurosurgery;  Laterality: Right;  3C/RM 21   THORACIC DISCECTOMY  07/16/2012   Procedure: THORACIC DISCECTOMY;  Surgeon: Pasty Bongo, MD;  Location: [facility];  Service: Neurosurgery;  Laterality: Right;  RIGHT Thoracic seven-eight  thoracic diskectomy via thoracotomy by dr Sherene Dilling   THORACIC DISCECTOMY N/A 12/15/2014   Procedure: THORACIC SEVEN TO THORACIC NINE Laminectomy ;  Surgeon: Audie Bleacher, MD;  Location: [facility];  Service: Neurosurgery;  Laterality: N/A;   THORACIC  DISCECTOMY N/A 06/06/2017   Procedure: LAMINECTOMY THORACIC NINE-TEN;  Surgeon: Audie Bleacher, MD;  Location: [facility];  Service: Neurosurgery;  Laterality: N/A;  LAMINECTOMY THORACIC NINE-TEN   THORACOTOMY  07/20/2011   Procedure: THORACOTOMY OPEN FOR SPINE SURGERY;  Surgeon: Gilmore Lager, MD;  Location: [facility];  Service: Vascular;  Laterality: N/A;   THORACOTOMY  07/16/2012   Procedure: THORACOTOMY OPEN FOR SPINE SURGERY;  Surgeon: Bartley Lightning, MD;  Location: [facility];  Service: Thoracic;  Laterality: N/A;   Patient Active Problem List   Diagnosis Date Noted   Spasticity 09/25/2023   Nerve pain 12/05/2022   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   S/P lumbar spinal fusion 05/25/2022   Synovial cyst of lumbar facet joint 05/25/2022   HNP (herniated nucleus pulposus), lumbar 10/06/2021   Hand pain 06/28/2021   Lumbar facet arthropathy 01/11/2021   Thoracic spondylosis with myelopathy 01/11/2021   Thoracic spinal stenosis 06/06/2017   Stenosis, spinal, thoracic 12/15/2014   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 09/22/2014   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/2011    PCP: Cherisse Cornell  REFERRING PROVIDER: Rawland Caddy, MD  REFERRING DIAG: thoracic disc disease with myelopathy, nerve pain, adhesive arachnoiditis  Rationale for Evaluation and Treatment: Rehabilitation  THERAPY DIAG:  Other low back pain  Other symptoms and signs involving the nervous system  Radiculopathy, lumbar region  Muscle weakness (generalized)  Repeated falls  Difficulty in walking, not elsewhere classified  ONSET DATE:  Suddenly  started having weakness 11 years ago, most recent episode of worsening October 2022, s/p right L4-5 laminotomy, microdiscectomy on 10/06/2021, now s/p L4-5 PLIF by Dr. Franky Macho on 05/25/2022.  PERTINENT HISTORY:  Patient is a 43 y.o. male who presents to outpatient physical therapy with a referral for medical diagnosis  thoracic disc disease with myelopathy, nerve pain, adhesive arachnoiditis. This patient's chief complaints consist of disabling low back and R >L leg pain and weakness with activity, bowel and bladder urgency, incontinence, and retention, leading to the following functional deficits: difficulty with or unable to complete any activity that requires weight bearing, use of B LE, and/or balance including working, household and community mobility, walking, driving, going to family gatherings, avoiding incontinent episodes, playing with daughter, helping around the house, bed mobility, transfers. 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 Franky Macho on 05/25/2022, adhesive arachnoiditis, nerve pain.  Patient denies hx of cancer, stroke, seizures, lung problems, heart problems, diabetes, unexplained weight loss, and osteoporosis.  SUBJECTIVE:                                                                                                                                                                                           SUBJECTIVE STATEMENT:     Patient states he is doing okay today. He states after last PT session he had a lot of shoulder pain after last PT session and he had increased low back and right leg pain, especially the right groin was throbbing. He states he continues to feel PT is helping him. He feels "more stronger" "a little more stable." He states he can get around better than he could have. "There is a lot of stuff, I just can't remember."   PAIN:  NPRS: 2/10 mostly in low back,  right groin/hip, bilateral posterior thighs, right calf. Especially in the right shin area today.   PRECAUTIONS: Fall  PATIENT GOALS: "To get better, walk, drive again, climb stairs, play with my daughter, help around the house again"  NEXT MD VISIT:   OBJECTIVE  SELF-REPORTED FUNCTION Patient Specific Functional Scale (PSFS)  Walking Around People: 2 Enjoying Life: 5 Driving: 1 Average: 2.9/56   FUNCTIONAL/BALANCE TESTS: 6 Minute Walk Test:  Purpose: to assess function with community mobility Score: 2:45 feet with BRW.  Test  concluded early with 3:23 min remaining due to low back, R hip, and nerve pain as well as progressive LE weakness (low back and right shin worst) Pain up to 7/10.   Analysis: patient ambulated significantly less than 1890 feet which is the norm for healthy males of his age, and he ambulated just under 100 feet less than last progress note on 10/08/2023. He still ambulated significantly more than he did the measurement prior to that which was 152 feet on 06/03/2023. He continues to be limited by low back pain, R hip pain, and neurologic pain and weakness in B LE with weight bearing activities. Pain was worst in low back and right shin.    Five Time Sit to Stand (5TSTS):  Purpose: to assess LE functional strength and power for functional mobility.  Score: 18 seconds from 18.5 inch plinth heavy B UE support on BRW  Analysis: patient scored significantly worse than the 7.61.8 sec that is the norm for healthy males of his age. He also required heavy UE support while test norms assume UE crossed over chest. This demonstrates a significantly worse LE strength and power than would be expected for his age group and is actually worse than the norm for 37-59 year old community dwelling adults, which is 10.6 + 3.4 seconds. His score is 1 second better than was recorded last on 10/08/2023 which was 19 seconds with similar UE support. His score was better than his baseline of 23  seconds at the start of current episode of care.    TREATMENT Manual therapy: to reduce pain and tissue tension, improve range of motion, neuromodulation, in order to promote improved ability to complete functional activities. Hooklying R hip caudal/posterior glide with belt, 4x30-45 seconds, grade IV  Physical Performance Test or Measurement: a physical performance test or measurement with written report.  6 Minute Walk Test and 5 Times Sit To Stand Test (see above for written reports)   Therapeutic exercise: therapeutic exercises that incorporate ONE parameter at one or more areas of the body to centralize symptoms, develop strength and endurance, range of motion, and flexibility required for successful completion of functional activities.  Superset:  Seated overhead pull down (B shoulder extension from ~120 degrees flexion to bar on thighs) with cable bar  3x20 at 20# Increasing pain at right LE, but pt feels it is acceptable  Seated CHEST press with  3x15 with 23# Barbell   Seated lumbar flexion roll out   2 minutes self selected pace   Therapeutic activities: dynamic therapeutic activities incorporating MULTIPLE parameters or areas of the body designed to achieve improved functional performance.  Sit <> stand with TRX support from 17 inch chair with airex+a2x pad placed in chair 1x5 1 min rest 1x5 1 min rest 1x5 SBA and stabilization of chair by PT    Pt required multimodal cuing for proper technique and to facilitate improved neuromuscular control, strength, range of motion, and functional ability resulting in improved performance and form.   PATIENT EDUCATION:  Education details: Exercise purpose/form. Self management techniques.  Person educated: patient Education method: Explanation Education comprehension: verbalized understanding and needs further education  HOME EXERCISE PROGRAM: Verbally:  - seated lumbar flexion stretch with head down and one LE extended  using rollator, 1x10 each side.  - curl up with legs elevated, 5x15 seconds - sidelying open book 1x10 each side  Access Code: [email or web address] URL: [email or web address] Date: 11/18/2023 Prepared by: Alleen Isle  Exercises - Supine Diaphragmatic Breathing  - 2-3  x daily - 2 sets - 10 breaths - Seated Diaphragmatic Breathing  - 3 reps - 1 minute practice time  ASSESSMENT:  CLINICAL IMPRESSION:     Patient has attended 71 physical therapy sessions since starting current episode of care on 03/12/2023. He continues to have variations in activity tolerance, which leads to missing appointments while he recovers. He continues to be very motivated and works hard in clinic, but is very limited in what he can due related to irritability of condition. His condition is severe, chronic, and degenerative in nature and continued PT is medically necessary to prevent decline in functional independence. Patient would benefit from continued management of limiting condition by skilled physical therapist to address remaining impairments and functional limitations to work towards stated goals and return to PLOF or maximal functional independence.   OBJECTIVE IMPAIRMENTS: Abnormal gait, decreased activity tolerance, decreased balance, decreased coordination, decreased endurance, decreased knowledge of condition, decreased knowledge of use of DME, decreased mobility, difficulty walking, decreased ROM, decreased strength, increased edema, impaired perceived functional ability, increased muscle spasms, impaired flexibility, impaired sensation, impaired tone, improper body mechanics, postural dysfunction, obesity, and pain.   ACTIVITY LIMITATIONS: carrying, lifting, bending, standing, squatting, stairs, transfers, bed mobility, continence, bathing, toileting, dressing, hygiene/grooming, locomotion level, and caring for others  PARTICIPATION LIMITATIONS: meal prep, cleaning, laundry, interpersonal relationship,  driving, shopping, community activity, occupation, yard work, and   difficulty with or unable to complete any activity that requires weight bearing, use of B LE, and/or balance including working, household and community mobility, walking, driving, going to family gatherings, avoiding incontinent episodes, playing with daughter, helping around the house, bed mobility, transfers  PERSONAL FACTORS: Past/current experiences, Time since onset of injury/illness/exacerbation, and 3+ comorbidities:   3 thoracic spine surgeries, thoracic MRI notes "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 Franky Macho on 05/25/2022, adhesive arachnoiditis, nerve pain are also affecting patient's functional outcome.   REHAB POTENTIAL: Fair due to severity and nature of condition.   CLINICAL DECISION MAKING: Evolving/moderate complexity  EVALUATION COMPLEXITY: Moderate   GOALS: Goals reviewed with patient? No  SHORT TERM GOALS: Target date: 01/09/2023. Target date updated to 05/28/2023 for all unmet goals on 03/05/2023.   Patient will be independent with initial home exercise program for self-management of symptoms. Baseline: Initial HEP to be provided at visit 2 as appropriate (12/26/22); Goal status: MET   LONG TERM GOALS: Target date: 03/20/2023. Target date updated to 08/26/23  for all unmet goals on 06/03/2023. Target date updated to 11/20/2023 for all  unmet goals on 08/28/2023. Target date updated to 02/17/2024  for all unmet goals on 11/25/2023.  Patient will be independent with a long-term home exercise program for self-management of symptoms.  Baseline: Initial HEP to be provided at visit 2 as appropriate  (12/26/22); participating as able (01/31/2023); patient currently participating as tolerated (03/05/2023; 04/09/2023; 06/03/2023); participates daily in mat exercises and in weight bearing exercises as tolerated (10/08/2023; 11/25/2023);  Goal status: In-progress  2.  Patient will demonstrate improved FOTO by equal or greater than 10 points by visit #13 to demonstrate improvement in overall condition and self-reported functional ability.  Baseline: to be tested visit 2 as appropriate (12/26/22); 30 at visit #3 (01/02/2023); 46 at visit #  13 (03/05/2023); 45 at visit #29 (06/03/2023); 49 at visit #50 (10/08/2023);  Goal status: MET  3.  Patient will demonstrate the ability to ambulate equal or greater than 600 feet with LRAD during the 6 minute walk to improve his household and community mobility.  Baseline: 34 feet with bari-RW (12/26/22); 200 feet with bari-RW (01/31/2023); 500 feet with BRW (03/05/2023); 400 feet with BRW (04/09/2023); 152 feet with BRW and W/C follow for safety (06/03/2023); 330 feet with BRW (10/08/2023); 245 feet with BRW and SBA for safety (11/25/2023);  Goal status: Ongoing  4.  Patient will complete 5 Time Sit To Stand Test from 19.5 inch surface or lower in equal or less than 15 seconds with no UE support to demonstrate improved transfer ability for improved household and toileting mobility.  Baseline: 23 seconds with heavy B UE support on RW from 19.5 inch plinth. Pain throbbing down the right LE.  (12/26/22); 19 seconds with heavy B UE support on RW from 19.5 inch plinth (03/05/2023); 20 seconds with heavy B UE support on RRW from 18.5 inch plinth. Painful in back of B calves, thighs, and lower back (04/09/2023); 15 seconds with heavy B UE support on BRW from 18.5 inch plinth. Painful in back of B calves, thighs, and lower back (06/03/2023); 19 seconds with heavy B UE support on BRW from 18.5 inch plinth. Painful in back of B calves, thighs, and lower back (10/08/2023); 18 seconds with heavy B  UE support on BRW from 18.5 inch plinth. Painful in back of B calves, thighs, and lower back (11/25/2023);  Goal status: Progressing   5.  Patient will report worst pain equal or less than 3/10 with functional activities to improve his ability to complete basic household and community mobility. Baseline: up to 8/10 (12/26/22); reports 3/10 pain (01/31/2023); reports 4/10 pain (02/19/2023); up to 7/10 in the last 2 week s(03/05/2023): up to  7/10 in the last two weeks (04/09/2023); up to 8/10 in the last 2 weeks (06/03/2023);  up to 9/10 over the last 2 weeks (10/08/2023); up to 7-8/10 in the last 2 weeks (11/25/2023); Goal status: Ongoing  6.  Patient will demonstrate improvement in Patient Specific Functional Scale (PSFS) by equal or greater than 3/10 points to reflect clinically significant improvement in patient's most valued functional activities. Baseline: 1.7/10 for Walking Around [address], Enjoying Life, and Driving (78/29/56); 2.1/30 (11/25/2023);  Goal status: progressing    PLAN:  PT FREQUENCY: 1-2x/week  PT DURATION: 12 weeks  PLANNED INTERVENTIONS: Therapeutic exercises, Therapeutic activity, Neuromuscular re-education, Balance training, Gait training, Patient/Family education, Self Care, Joint mobilization, Stair training, Orthotic/Fit training, DME instructions, Aquatic Therapy, Dry Needling, Electrical stimulation, Wheelchair mobility training, Spinal mobilization, Cryotherapy, Moist heat, Manual therapy, and Re-evaluation.  PLAN FOR NEXT SESSION:  update HEP as appropriate, neurodynamics, gait training, LE/core/functional strengthening, stretching, and balance as tolerated, education, manual therapy as needed.   Carilyn Charles. Artemio Larry, PT, DPT 11/26/23, 8:41 AM  [facility] [address] [place], [place] [address] P: [phone] I F: [phone]

## 2023-11-26 ENCOUNTER — Encounter: Payer: Self-pay | Admitting: Physical Therapy

## 2023-11-27 ENCOUNTER — Ambulatory Visit: Payer: Medicare Other | Admitting: Physical Therapy

## 2023-12-04 ENCOUNTER — Ambulatory Visit: Admitting: Physical Therapy

## 2023-12-04 ENCOUNTER — Encounter: Payer: Self-pay | Admitting: Physical Therapy

## 2023-12-04 DIAGNOSIS — R262 Difficulty in walking, not elsewhere classified: Secondary | ICD-10-CM

## 2023-12-04 DIAGNOSIS — M5459 Other low back pain: Secondary | ICD-10-CM

## 2023-12-04 DIAGNOSIS — R29818 Other symptoms and signs involving the nervous system: Secondary | ICD-10-CM

## 2023-12-04 DIAGNOSIS — R296 Repeated falls: Secondary | ICD-10-CM

## 2023-12-04 DIAGNOSIS — M5416 Radiculopathy, lumbar region: Secondary | ICD-10-CM

## 2023-12-04 DIAGNOSIS — M6281 Muscle weakness (generalized): Secondary | ICD-10-CM

## 2023-12-04 NOTE — Therapy (Signed)
 OUTPATIENT PHYSICAL THERAPY TREATMENT   Patient Name: Travis Fitzpatrick MRN: [id number] DOB:[birth year], 43 y.o., male Today's Date: 12/04/23   END OF SESSION:  PT End of Session - 12/04/23 1857     Visit Number 60    Number of Visits 63    Date for PT Re-Evaluation 02/17/24    Authorization Type MEDICARE PART B reporting period from 10/08/2023    Progress Note Due on Visit 60    PT Start Time 1739    PT Stop Time 1817    PT Time Calculation (min) 38 min    Activity Tolerance Patient limited by pain;Patient tolerated treatment well    Behavior During Therapy [facility] for tasks assessed/performed             Past Medical History:  Diagnosis Date   Bowel trouble    urgency   Medical history non-contributory    Urinary urgency    Past Surgical History:  Procedure Laterality Date   BACK SURGERY  2010   CIRCUMCISION     LUMBAR LAMINECTOMY/DECOMPRESSION MICRODISCECTOMY  07/20/2011   Procedure: LUMBAR LAMINECTOMY/DECOMPRESSION MICRODISCECTOMY;  Surgeon: Pasty Bongo;  Location: [facility];  Service: Neurosurgery;  Laterality: N/A;  right thoracotomy with thoracic eight-nine discectomy and fusion   LUMBAR LAMINECTOMY/DECOMPRESSION MICRODISCECTOMY Right 10/06/2021   Procedure: Right Lumbar Four-Five Microdiscectomy, Right Lumbar Five-Sacal One Foraminotomy;  Surgeon: Audie Bleacher, MD;  Location: [facility];  Service: Neurosurgery;  Laterality: Right;  3C/RM 21   THORACIC DISCECTOMY  07/16/2012   Procedure: THORACIC DISCECTOMY;  Surgeon: Pasty Bongo, MD;  Location: [facility];  Service: Neurosurgery;  Laterality: Right;  RIGHT Thoracic seven-eight  thoracic diskectomy via thoracotomy by dr Sherene Dilling   THORACIC DISCECTOMY N/A 12/15/2014   Procedure: THORACIC SEVEN TO THORACIC NINE Laminectomy ;  Surgeon: Audie Bleacher, MD;  Location: [facility];  Service: Neurosurgery;  Laterality: N/A;   THORACIC DISCECTOMY N/A 06/06/2017   Procedure: LAMINECTOMY THORACIC NINE-TEN;  Surgeon: Audie Bleacher,  MD;  Location: [facility];  Service: Neurosurgery;  Laterality: N/A;  LAMINECTOMY THORACIC NINE-TEN   THORACOTOMY  07/20/2011   Procedure: THORACOTOMY OPEN FOR SPINE SURGERY;  Surgeon: Gilmore Lager, MD;  Location: [facility];  Service: Vascular;  Laterality: N/A;   THORACOTOMY  07/16/2012   Procedure: THORACOTOMY OPEN FOR SPINE SURGERY;  Surgeon: Bartley Lightning, MD;  Location: [facility];  Service: Thoracic;  Laterality: N/A;   Patient Active Problem List   Diagnosis Date Noted   Spasticity 09/25/2023   Nerve pain 12/05/2022   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   S/P lumbar spinal fusion 05/25/2022   Synovial cyst of lumbar facet joint 05/25/2022   HNP (herniated nucleus pulposus), lumbar 10/06/2021   Hand pain 06/28/2021   Lumbar facet arthropathy 01/11/2021   Thoracic spondylosis with myelopathy 01/11/2021   Thoracic spinal stenosis 06/06/2017   Stenosis, spinal, thoracic 12/15/2014   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 09/22/2014   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/2011    PCP: Cherisse Cornell  REFERRING PROVIDER: Rawland Caddy, MD  REFERRING DIAG: thoracic disc disease with myelopathy, nerve pain, adhesive arachnoiditis  Rationale for Evaluation and Treatment: Rehabilitation  THERAPY DIAG:  Other low back pain  Other symptoms and signs involving the nervous system  Radiculopathy, lumbar region  Muscle weakness (generalized)  Repeated falls  Difficulty in walking, not elsewhere classified  ONSET DATE:  Suddenly started having weakness 11 years ago, most recent episode of worsening October 2022,  s/p right L4-5 laminotomy, microdiscectomy on 10/06/2021, now s/p L4-5 PLIF by Dr. Michale Age on 05/25/2022.  PERTINENT HISTORY:  Patient is a 43 y.o. male who presents to outpatient physical therapy with a referral for medical diagnosis thoracic disc disease with myelopathy, nerve pain, adhesive arachnoiditis. This patient's chief  complaints consist of disabling low back and R >L leg pain and weakness with activity, bowel and bladder urgency, incontinence, and retention, leading to the following functional deficits: difficulty with or unable to complete any activity that requires weight bearing, use of B LE, and/or balance including working, household and community mobility, walking, driving, going to family gatherings, avoiding incontinent episodes, playing with daughter, helping around the house, bed mobility, transfers. 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 Michale Age on 05/25/2022, adhesive arachnoiditis, nerve pain.  Patient denies hx of cancer, stroke, seizures, lung problems, heart problems, diabetes, unexplained weight loss, and osteoporosis.  SUBJECTIVE:                                                                                                                                                                                           SUBJECTIVE STATEMENT:     Patient states he is feeling better today than the last 2 weeks. He has been having constitutional symptoms associated with AA with increased pain and weakness over the last 2 weeks. He continues to notice needing to have a BM triggers his symptoms. He has taken out rice and noodles from his diet to decrease constipation. He states peanut butter helps him goes. Broccoli gives him a Travis of gas but he still eats it. He has a sensitivity to certain fruits. Ice cream makes him go. Cheese stops him up. He fell on Friday. He was getting up to go to the  bathroom. When he turned the corner his back or leg didn't like it and he fell. He had is BRW instead of the rollator that day. He does not feel he got hurt. He fell on his buttocks against the wall. His wife helped him get up. He states he discussed some of his issues with fecal incontinence and constipation with Dr. Rachel Budds,  who gave him advice and suggested as suppository to help (pt states it did not). Patient states he has felt improvement in fecal  incontinence with core strengthening in PT, although he still has issues. He is clean when he wipes but when he goes back to the bathroom again he finds fecal matter if he wipes before going.   PAIN:  NPRS: 2/10 mostly in low back, right groin/hip, bilateral posterior thighs, right calf.   PRECAUTIONS: Fall  PATIENT GOALS: "To get better, walk, drive again, climb stairs, play with my daughter, help around the house again"  NEXT MD VISIT:   OBJECTIVE (measurement last taken on 11/25/2023);   SELF-REPORTED FUNCTION Patient Specific Functional Scale (PSFS)  Walking Around People: 2 Enjoying Life: 5 Driving: 1 Average: 2.9/52   FUNCTIONAL/BALANCE TESTS: 6 Minute Walk Test:  Purpose: to assess function with community mobility Score: 2:45 feet with BRW.  Test concluded early with 3:23 min remaining due to low back, R hip, and nerve pain as well as progressive LE weakness (low back and right shin worst) Pain up to 7/10.   Analysis: patient ambulated significantly less than 1890 feet which is the norm for healthy males of his age, and he ambulated just under 100 feet less than last progress note on 10/08/2023. He still ambulated significantly more than he did the measurement prior to that which was 152 feet on 06/03/2023. He continues to be limited by low back pain, R hip pain, and neurologic pain and weakness in B LE with weight bearing activities. Pain was worst in low back and right shin.    Five Time Sit to Stand (5TSTS):  Purpose: to assess LE  functional strength and power for functional mobility.  Score: 18 seconds from 18.5 inch plinth heavy B UE support on BRW  Analysis: patient scored significantly worse than the 7.61.8 sec that is the norm for healthy males of his age. He also required heavy UE support while test norms assume UE crossed over chest. This demonstrates a significantly worse LE strength and power than would be expected for his age group and is actually worse than the norm for 88-71 year old community dwelling adults, which is 10.6 + 3.4 seconds. His score is 1 second better than was recorded last on 10/08/2023 which was 19 seconds with similar UE support. His score was better than his baseline of 23 seconds at the start of current episode of care.    TREATMENT Manual therapy: to reduce pain and tissue tension, improve range of motion, neuromodulation, in order to promote improved ability to complete functional activities.  Hooklying R hip caudal/posterior glide with belt, 4x30-45 seconds, grade IV  Therapeutic exercise: therapeutic exercises that incorporate ONE parameter at one or more areas of the body to centralize symptoms, develop strength and endurance, range of motion, and flexibility required for successful completion of functional activities.  Superset:  Seated overhead pull down (B shoulder extension from ~120 degrees flexion to bar on thighs) with cable bar  3x20 at 25# Increasing pain at right LE, but pt feels it is acceptable  Seated CHEST press with  3x15 with 23# Barbell  Seated lumbar flexion roll out   2x minutes self selected pace and time   Neuromuscular Re-education: a technique or exercise performed with the goal of improving the level of communication between the body and the brain, such as for balance, motor control, muscle activation patterns, coordination, desensitization, quality of muscle contraction, proprioception, and/or kinesthetic sense needed for successful and safe completion of  functional activities.   Education about core cannister model for the abdominal cavity and how to use breathing and  core bracing to support the back while squatting/transferring. Also discussed how this is related to pelvic floor.   Sit <> stand to squat with TRX support from 17 inch chair with airex pad placed in chair 3x5-7 reps concentrating on learning and coordinating valsava brace SBA and stabilization of chair by PT Focus on coordinating abdominal valsalva brace during squat and sit <> stand resulting in patient with more LE power and much less back pain while squatting.     Pt required multimodal cuing for proper technique and to facilitate improved neuromuscular control, strength, range of motion, and functional ability resulting in improved performance and form.   PATIENT EDUCATION:  Education details: Exercise purpose/form. Self management techniques.  Person educated: patient Education method: Explanation Education comprehension: verbalized understanding and needs further education  HOME EXERCISE PROGRAM: Verbally:  - seated lumbar flexion stretch with head down and one LE extended using rollator, 1x10 each side.  - curl up with legs elevated, 5x15 seconds - sidelying open book 1x10 each side  Access Code: [email or web address] URL: [email or web address] Date: 11/18/2023 Prepared by: Alleen Isle  Exercises - Supine Diaphragmatic Breathing  - 2-3 x daily - 2 sets - 10 breaths - Seated Diaphragmatic Breathing  - 3 reps - 1 minute practice time  ASSESSMENT:  CLINICAL IMPRESSION:     Patient missed his 2nd PT visit last week due to difficulty with flair in symptoms including increased concordant pain, weakness, and constitutional symptoms that accompany his condition such as nausea, photophobia, phonophobia, and lightheadedness. He continues to get pain relief in the right hip from long axis distraction. Education on core cannister and bracing during squatting was  completed and he was able to improve his pain with squatting and improve force transfer resulting in improved LE power and strength during TRX squat/sit<>stand. Patient also expressing more needs regarding pelvic floor related issues such as fecal incontinence that are likely also affecting his core cannister and active support of the low back. His low concordant pain and strength are negatively affected by issues with bowel function, where he feels much weaker and has worse pain when he has the urge to have a BM. Plan to incorporate more interventions to help address the pelvic floor to improve pain, function, and continence as appropriate.  Patient has attended 60 physical therapy sessions since starting current episode of care on 03/12/2023. He continues to have variations in activity tolerance, which leads to missing appointments while he recovers. He continues to be very motivated and works hard in clinic, but is very limited in what he can due related to irritability of condition. His condition is severe, chronic, and degenerative in nature and continued PT is medically necessary to prevent decline in functional independence. Patient would benefit from continued management of limiting condition by skilled physical therapist to address remaining impairments and functional limitations to work towards stated goals and return to PLOF or maximal functional independence.   OBJECTIVE IMPAIRMENTS: Abnormal gait, decreased activity tolerance, decreased balance, decreased coordination, decreased endurance, decreased knowledge of condition, decreased knowledge of use of DME, decreased mobility, difficulty walking, decreased ROM, decreased strength, increased edema, impaired perceived functional ability, increased muscle spasms, impaired flexibility, impaired sensation, impaired tone, improper body mechanics, postural dysfunction, obesity, and pain.   ACTIVITY LIMITATIONS: carrying, lifting, bending, standing,  squatting, stairs, transfers, bed mobility, continence, bathing, toileting, dressing, hygiene/grooming, locomotion level, and caring for others  PARTICIPATION LIMITATIONS: meal prep, cleaning, laundry, interpersonal relationship, driving, shopping, community activity, occupation, yard work, and  difficulty with or unable to complete any activity that requires weight bearing, use of B LE, and/or balance including working, household and community mobility, walking, driving, going to family gatherings, avoiding incontinent episodes, playing with daughter, helping around the house, bed mobility, transfers  PERSONAL FACTORS: Past/current experiences, Time since onset of injury/illness/exacerbation, and 3+ comorbidities:   3 thoracic spine surgeries, thoracic MRI notes "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 Michale Age on 05/25/2022, adhesive arachnoiditis, nerve pain are also affecting patient's functional outcome.   REHAB POTENTIAL: Fair due to severity and nature of condition.   CLINICAL DECISION MAKING: Evolving/moderate complexity  EVALUATION COMPLEXITY: Moderate   GOALS: Goals reviewed with patient? No  SHORT TERM GOALS: Target date: 01/09/2023. Target date updated to 05/28/2023 for all unmet goals on 03/05/2023.   Patient will be independent with initial home exercise program for self-management of symptoms. Baseline: Initial HEP to be provided at visit 2 as appropriate (12/26/22); Goal status: MET   LONG TERM GOALS: Target date: 03/20/2023. Target date updated to 08/26/23  for all unmet goals on 06/03/2023. Target date updated to 11/20/2023 for all  unmet goals on 08/28/2023.  Target date updated to 02/17/2024  for all unmet goals on 11/25/2023.  Patient will be independent with a long-term home exercise program for self-management of symptoms.  Baseline: Initial HEP to be provided at visit 2 as appropriate (12/26/22); participating as able (01/31/2023); patient currently participating as tolerated (03/05/2023; 04/09/2023; 06/03/2023); participates daily in mat exercises and in weight bearing exercises as tolerated (10/08/2023; 11/25/2023);  Goal status: In-progress  2.  Patient will demonstrate improved FOTO by equal or greater than 10 points by visit #13 to demonstrate improvement in overall condition and self-reported functional ability.  Baseline: to be tested visit 2 as appropriate (12/26/22); 30 at visit #3 (01/02/2023); 46 at visit #13 (03/05/2023); 45 at visit #29 (06/03/2023); 49 at visit #50 (10/08/2023);  Goal status: MET  3.  Patient will demonstrate the ability to ambulate equal or greater than 600 feet with LRAD during the 6 minute walk to improve his household and community mobility.  Baseline: 34 feet with bari-RW (12/26/22); 200 feet with bari-RW (01/31/2023); 500 feet with BRW (03/05/2023); 400 feet with BRW (04/09/2023); 152 feet with BRW and W/C follow for safety (06/03/2023); 330 feet with BRW (10/08/2023); 245 feet with BRW and SBA for safety (11/25/2023);  Goal status: Ongoing  4.  Patient will complete 5 Time Sit To Stand Test from 19.5 inch surface or lower in equal or less than 15 seconds with no UE support to demonstrate improved transfer ability for improved household and toileting mobility.  Baseline: 23 seconds with heavy B UE support on RW from 19.5 inch plinth. Pain throbbing down the right LE.  (12/26/22); 19 seconds with heavy B UE support on RW from 19.5 inch plinth (03/05/2023); 20 seconds with heavy B UE support on RRW from 18.5 inch plinth. Painful in back of B calves, thighs, and lower back (04/09/2023); 15 seconds with heavy B UE support on BRW from  18.5 inch plinth. Painful in back of B calves, thighs, and lower back (06/03/2023); 19 seconds with heavy B UE support on BRW from 18.5 inch plinth. Painful in back of B  calves, thighs, and lower back (10/08/2023); 18 seconds with heavy B UE support on BRW from 18.5 inch plinth. Painful in back of B calves, thighs, and lower back (11/25/2023);  Goal status: Progressing   5.  Patient will report worst pain equal or less than 3/10 with functional activities to improve his ability to complete basic household and community mobility. Baseline: up to 8/10 (12/26/22); reports 3/10 pain (01/31/2023); reports 4/10 pain (02/19/2023); up to 7/10 in the last 2 week s(03/05/2023): up to  7/10 in the last two weeks (04/09/2023); up to 8/10 in the last 2 weeks (06/03/2023);  up to 9/10 over the last 2 weeks (10/08/2023); up to 7-8/10 in the last 2 weeks (11/25/2023); Goal status: Ongoing  6.  Patient will demonstrate improvement in Patient Specific Functional Scale (PSFS) by equal or greater than 3/10 points to reflect clinically significant improvement in patient's most valued functional activities. Baseline: 1.7/10 for Walking Around [address], Enjoying Life, and Driving (65/78/46); 9.6/29 (11/25/2023);  Goal status: progressing    PLAN:  PT FREQUENCY: 1-2x/week  PT DURATION: 12 weeks  PLANNED INTERVENTIONS: Therapeutic exercises, Therapeutic activity, Neuromuscular re-education, Balance training, Gait training, Patient/Family education, Self Care, Joint mobilization, Stair training, Orthotic/Fit training, DME instructions, Aquatic Therapy, Dry Needling, Electrical stimulation, Wheelchair mobility training, Spinal mobilization, Cryotherapy, Moist heat, Manual therapy, and Re-evaluation.  PLAN FOR NEXT SESSION:  update HEP as appropriate, neurodynamics, gait training, LE/core/functional strengthening, stretching, and balance as tolerated, education, manual therapy as needed. Pelvic floor.    Carilyn Charles. Artemio Larry, PT,  DPT 12/04/23, 7:17 PM  [facility] [address] [place], [place] [address] P: [phone] I F: [phone]

## 2023-12-11 ENCOUNTER — Ambulatory Visit: Admitting: Physical Therapy

## 2023-12-16 ENCOUNTER — Ambulatory Visit: Attending: Physical Medicine & Rehabilitation | Admitting: Physical Therapy

## 2023-12-16 ENCOUNTER — Encounter: Payer: Self-pay | Admitting: Physical Therapy

## 2023-12-16 DIAGNOSIS — M5416 Radiculopathy, lumbar region: Secondary | ICD-10-CM | POA: Insufficient documentation

## 2023-12-16 DIAGNOSIS — M5442 Lumbago with sciatica, left side: Secondary | ICD-10-CM | POA: Diagnosis present

## 2023-12-16 DIAGNOSIS — M5441 Lumbago with sciatica, right side: Secondary | ICD-10-CM | POA: Diagnosis present

## 2023-12-16 DIAGNOSIS — G8929 Other chronic pain: Secondary | ICD-10-CM | POA: Insufficient documentation

## 2023-12-16 DIAGNOSIS — R2689 Other abnormalities of gait and mobility: Secondary | ICD-10-CM | POA: Insufficient documentation

## 2023-12-16 DIAGNOSIS — R296 Repeated falls: Secondary | ICD-10-CM | POA: Diagnosis present

## 2023-12-16 DIAGNOSIS — M5459 Other low back pain: Secondary | ICD-10-CM | POA: Diagnosis present

## 2023-12-16 DIAGNOSIS — M6281 Muscle weakness (generalized): Secondary | ICD-10-CM | POA: Diagnosis present

## 2023-12-16 DIAGNOSIS — R29818 Other symptoms and signs involving the nervous system: Secondary | ICD-10-CM | POA: Diagnosis present

## 2023-12-16 DIAGNOSIS — R262 Difficulty in walking, not elsewhere classified: Secondary | ICD-10-CM | POA: Insufficient documentation

## 2023-12-16 DIAGNOSIS — M546 Pain in thoracic spine: Secondary | ICD-10-CM | POA: Diagnosis present

## 2023-12-16 NOTE — Therapy (Signed)
 OUTPATIENT PHYSICAL THERAPY TREATMENT   Patient Name: Travis Fitzpatrick MRN: [id number] DOB:1981-07-21, 43 y.o., male Today's Date: 12/16/23   END OF SESSION:  PT End of Session - 12/16/23 1608     Visit Number 61    Number of Visits 63    Date for PT Re-Evaluation 02/17/24    Authorization Type MEDICARE PART B reporting period from 12/04/23    Progress Note Due on Visit 60    PT Start Time 1604    PT Stop Time 1650    PT Time Calculation (min) 46 min    Activity Tolerance Patient limited by pain;Patient tolerated treatment well    Behavior During Therapy [facility] for tasks assessed/performed              Past Medical History:  Diagnosis Date   Bowel trouble    urgency   Medical history non-contributory    Urinary urgency    Past Surgical History:  Procedure Laterality Date   BACK SURGERY  2010   CIRCUMCISION     LUMBAR LAMINECTOMY/DECOMPRESSION MICRODISCECTOMY  07/20/2011   Procedure: LUMBAR LAMINECTOMY/DECOMPRESSION MICRODISCECTOMY;  Surgeon: Pasty Bongo;  Location: [facility];  Service: Neurosurgery;  Laterality: N/A;  right thoracotomy with thoracic eight-nine discectomy and fusion   LUMBAR LAMINECTOMY/DECOMPRESSION MICRODISCECTOMY Right 10/06/2021   Procedure: Right Lumbar Four-Five Microdiscectomy, Right Lumbar Five-Sacal One Foraminotomy;  Surgeon: Audie Bleacher, MD;  Location: [facility];  Service: Neurosurgery;  Laterality: Right;  3C/RM 21   THORACIC DISCECTOMY  07/16/2012   Procedure: THORACIC DISCECTOMY;  Surgeon: Pasty Bongo, MD;  Location: [facility];  Service: Neurosurgery;  Laterality: Right;  RIGHT Thoracic seven-eight  thoracic diskectomy via thoracotomy by dr Sherene Dilling   THORACIC DISCECTOMY N/A 12/15/2014   Procedure: THORACIC SEVEN TO THORACIC NINE Laminectomy ;  Surgeon: Audie Bleacher, MD;  Location: [facility];  Service: Neurosurgery;  Laterality: N/A;   THORACIC DISCECTOMY N/A 06/06/2017   Procedure: LAMINECTOMY THORACIC NINE-TEN;  Surgeon: Audie Bleacher,  MD;  Location: [facility];  Service: Neurosurgery;  Laterality: N/A;  LAMINECTOMY THORACIC NINE-TEN   THORACOTOMY  07/20/2011   Procedure: THORACOTOMY OPEN FOR SPINE SURGERY;  Surgeon: Gilmore Lager, MD;  Location: [facility];  Service: Vascular;  Laterality: N/A;   THORACOTOMY  07/16/2012   Procedure: THORACOTOMY OPEN FOR SPINE SURGERY;  Surgeon: Bartley Lightning, MD;  Location: [facility];  Service: Thoracic;  Laterality: N/A;   Patient Active Problem List   Diagnosis Date Noted   Spasticity 09/25/2023   Nerve pain 12/05/2022   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   S/P lumbar spinal fusion 05/25/2022   Synovial cyst of lumbar facet joint 05/25/2022   HNP (herniated nucleus pulposus), lumbar 10/06/2021   Hand pain 06/28/2021   Lumbar facet arthropathy 01/11/2021   Thoracic spondylosis with myelopathy 01/11/2021   Thoracic spinal stenosis 06/06/2017   Stenosis, spinal, thoracic 12/15/2014   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 09/22/2014   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/2011    PCP: Cherisse Cornell  REFERRING PROVIDER: Rawland Caddy, MD  REFERRING DIAG: thoracic disc disease with myelopathy, nerve pain, adhesive arachnoiditis  Rationale for Evaluation and Treatment: Rehabilitation  THERAPY DIAG:  Other low back pain  Other symptoms and signs involving the nervous system  Radiculopathy, lumbar region  Muscle weakness (generalized)  Repeated falls  Difficulty in walking, not elsewhere classified  ONSET DATE:  Suddenly started having weakness 11 years ago, most recent episode of worsening October  2022, s/p right L4-5 laminotomy, microdiscectomy on 10/06/2021, now s/p L4-5 PLIF by Dr. Michale Age on 05/25/2022.  PERTINENT HISTORY:  Patient is a 43 y.o. male who presents to outpatient physical therapy with a referral for medical diagnosis thoracic disc disease with myelopathy, nerve pain, adhesive arachnoiditis. This patient's chief  complaints consist of disabling low back and R >L leg pain and weakness with activity, bowel and bladder urgency, incontinence, and retention, leading to the following functional deficits: difficulty with or unable to complete any activity that requires weight bearing, use of B LE, and/or balance including working, household and community mobility, walking, driving, going to family gatherings, avoiding incontinent episodes, playing with daughter, helping around the house, bed mobility, transfers. 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 Michale Age on 05/25/2022, adhesive arachnoiditis, nerve pain.  Patient denies hx of cancer, stroke, seizures, lung problems, heart problems, diabetes, unexplained weight loss, and osteoporosis.  SUBJECTIVE:                                                                                                                                                                                           SUBJECTIVE STATEMENT:     Patient reports he is okay today. He states last week he had more of the same kind of trouble except no headaches or nausea. He states he has been having more of a pressure in his low back pain while he is in the standing position. His legs started to shake once when he was standing while feeling this pressure. He tried to stand straight up and his legs started shaking. He tried it once and it had the shaking. He tried it again and it was wobbling. He has an appointment with Dr. Jamison Mccreedy on Wednesday (in person). He states the breathing exercise has been  helping him not needing to use spinal flexion to reduce his symptoms as much.   PAIN:  NPRS: 2/10 mostly in low back, right groin/hip, bilateral posterior thighs, right calf.   PRECAUTIONS: Fall  PATIENT GOALS: "To get better, walk, drive again, climb stairs, play with my daughter, help around the house again"  NEXT MD VISIT:   OBJECTIVE  TREATMENT Manual therapy: to reduce pain and tissue tension, improve range of motion, neuromodulation, in order to promote improved ability  to complete functional activities.  Hooklying R hip caudal/posterior glide with belt, 4x45 seconds, grade IV  Therapeutic exercise: therapeutic exercises that incorporate ONE parameter at one or more areas of the body to centralize symptoms, develop strength and endurance, range of motion, and flexibility required for successful completion of functional activities.  Superset:  Seated overhead pull down (B shoulder extension from ~120 degrees flexion to bar on thighs) with cable bar  3x20 at 25# Increasing pain at right LE, but pt feels it is acceptable  Seated CHEST press with  3x15 with 23# Barbell  Seated marching on 22-24 inch high plinth 2x10 each side with B UE support (unable to perform with no UE support without increased pain.   Seated slam ball shifts across front of body  1x~5 each way with 15# slam ball attempting to keep back still Discontinued due to inability to perform without increased pain.    Therapeutic activities: dynamic therapeutic activities incorporating MULTIPLE parameters or areas of the body designed to achieve improved functional performance.  Sit <> stand to squat with TRX support from 17 inch chair with airex pad placed in chair 3x5 reps concentrating on learning and coordinating valsava brace SBA and stabilization of chair by PT Practiced coordinating abdominal valsalva brace during squat and sit <> stand resulting in patient with more LE power and much less back pain while  squatting.   Neuromuscular Re-education: a technique or exercise performed with the goal of improving the level of communication between the body and the brain, such as for balance, motor control, muscle activation patterns, coordination, desensitization, quality of muscle contraction, proprioception, and/or kinesthetic sense needed for successful and safe completion of functional activities.   Education about anatomy and function of pelvic floor, with video showing how it contracts and relaxes and it's location and contribution to continence and the core canister/back support.   Practiced gaining awareness and neuromuscular connection pelvic floor AROM using A-frame house analogy and attempting to move the floor between attic, first floor, and basement. Patient responded well to cue to squeeze the butt hole and could feel this contraction and relaxation. Updated HEP to include pelvic floor AROM exercises with focus on improving awareness and control.     Pt required multimodal cuing for proper technique and to facilitate improved neuromuscular control, strength, range of motion, and functional ability resulting in improved performance and form.   PATIENT EDUCATION:  Education details: Exercise purpose/form. Self management techniques.  Person educated: patient Education method: Explanation Education comprehension: verbalized understanding and needs further education  HOME EXERCISE PROGRAM: Verbally:  - seated lumbar flexion stretch with head down and one LE extended using rollator, 1x10 each side.  - curl up with legs elevated, 5x15 seconds - sidelying open book 1x10 each side  Access Code: [email or web address] URL: [email or web address] Date: 12/16/2023 Prepared by: Alleen Isle  Exercises - Seated Diaphragmatic Breathing  - 3 reps - 1 minute practice time - Seated Pelvic Floor Elevators  - 3 x daily - 2 sets - 10 reps  ASSESSMENT:  CLINICAL IMPRESSION:     Patient missed PT  last week due to elevated symptoms and is reporting new pressure at low back in standing with B LE shaking. He has an appointment with his referring physician Dr. Rachel Budds in 2 days and was encouraged to discuss these new symptoms with him. Continued with previously tolerated exercises this visit. Patient's report of more severe symptoms do not correlate well with what he does in clinic. He reported feeling  better by then end of today's session than when he arrived. Initiated progression towards pelvic floor (PF) muscle training to improve core and lumbar stability with today's session focused on improving awareness of the anatomy and function of the PF, then initiating motor control exercises to try to feel the full range of motion of the PF.  Patient had difficulty feeling contraction but was able to feel his anus squeezing and relaxing. Patient would benefit from continued management of limiting condition by skilled physical therapist to address remaining impairments and functional limitations to work towards stated goals and return to PLOF or maximal functional independence.   OBJECTIVE IMPAIRMENTS: Abnormal gait, decreased activity tolerance, decreased balance, decreased coordination, decreased endurance, decreased knowledge of condition, decreased knowledge of use of DME, decreased mobility, difficulty walking, decreased ROM, decreased strength, increased edema, impaired perceived functional ability, increased muscle spasms, impaired flexibility, impaired sensation, impaired tone, improper body mechanics, postural dysfunction, obesity, and pain.   ACTIVITY LIMITATIONS: carrying, lifting, bending, standing, squatting, stairs, transfers, bed mobility, continence, bathing, toileting, dressing, hygiene/grooming, locomotion level, and caring for others  PARTICIPATION LIMITATIONS: meal prep, cleaning, laundry, interpersonal relationship, driving, shopping, community activity, occupation, yard work, and    difficulty with or unable to complete any activity that requires weight bearing, use of B LE, and/or balance including working, household and community mobility, walking, driving, going to family gatherings, avoiding incontinent episodes, playing with daughter, helping around the house, bed mobility, transfers  PERSONAL FACTORS: Past/current experiences, Time since onset of injury/illness/exacerbation, and 3+ comorbidities:   3 thoracic spine surgeries, thoracic MRI notes "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 Michale Age on 05/25/2022, adhesive arachnoiditis, nerve pain are also affecting patient's functional outcome.   REHAB POTENTIAL: Fair due to severity and nature of condition.   CLINICAL DECISION MAKING: Evolving/moderate complexity  EVALUATION COMPLEXITY: Moderate   GOALS: Goals reviewed with patient? No  SHORT TERM GOALS: Target date: 01/09/2023. Target date updated to 05/28/2023 for all unmet goals on 03/05/2023.   Patient will be independent with initial home exercise program for self-management of symptoms. Baseline: Initial HEP to be provided at visit 2 as appropriate (12/26/22); Goal status: MET   LONG TERM GOALS: Target date: 03/20/2023. Target date updated to 08/26/23  for all unmet goals on 06/03/2023. Target date updated to 11/20/2023 for all  unmet goals on 08/28/2023. Target date updated to 02/17/2024  for all unmet goals on 11/25/2023.  Patient will be independent with a long-term home exercise program for self-management of symptoms.  Baseline: Initial HEP to be provided at visit 2 as appropriate (12/26/22); participating as able (01/31/2023); patient currently  participating as tolerated (03/05/2023; 04/09/2023; 06/03/2023); participates daily in mat exercises and in weight bearing exercises as tolerated (10/08/2023; 11/25/2023);  Goal status: In-progress  2.  Patient will demonstrate improved FOTO by equal or greater than 10 points by visit #13 to demonstrate improvement in overall condition and self-reported functional ability.  Baseline: to be tested visit 2 as appropriate (12/26/22); 30 at visit #3 (01/02/2023); 46 at visit #13 (03/05/2023); 45 at visit #29 (06/03/2023); 49 at visit #50 (10/08/2023);  Goal status: MET  3.  Patient will demonstrate the ability to ambulate equal or greater than 600 feet with LRAD  during the 6 minute walk to improve his household and community mobility.  Baseline: 34 feet with bari-RW (12/26/22); 200 feet with bari-RW (01/31/2023); 500 feet with BRW (03/05/2023); 400 feet with BRW (04/09/2023); 152 feet with BRW and W/C follow for safety (06/03/2023); 330 feet with BRW (10/08/2023); 245 feet with BRW and SBA for safety (11/25/2023);  Goal status: Ongoing  4.  Patient will complete 5 Time Sit To Stand Test from 19.5 inch surface or lower in equal or less than 15 seconds with no UE support to demonstrate improved transfer ability for improved household and toileting mobility.  Baseline: 23 seconds with heavy B UE support on RW from 19.5 inch plinth. Pain throbbing down the right LE.  (12/26/22); 19 seconds with heavy B UE support on RW from 19.5 inch plinth (03/05/2023); 20 seconds with heavy B UE support on RRW from 18.5 inch plinth. Painful in back of B calves, thighs, and lower back (04/09/2023); 15 seconds with heavy B UE support on BRW from 18.5 inch plinth. Painful in back of B calves, thighs, and lower back (06/03/2023); 19 seconds with heavy B UE support on BRW from 18.5 inch plinth. Painful in back of B calves, thighs, and lower back (10/08/2023); 18 seconds with heavy B UE support on BRW from 18.5 inch plinth. Painful in back of B  calves, thighs, and lower back (11/25/2023);  Goal status: Progressing   5.  Patient will report worst pain equal or less than 3/10 with functional activities to improve his ability to complete basic household and community mobility. Baseline: up to 8/10 (12/26/22); reports 3/10 pain (01/31/2023); reports 4/10 pain (02/19/2023); up to 7/10 in the last 2 week s(03/05/2023): up to  7/10 in the last two weeks (04/09/2023); up to 8/10 in the last 2 weeks (06/03/2023);  up to 9/10 over the last 2 weeks (10/08/2023); up to 7-8/10 in the last 2 weeks (11/25/2023); Goal status: Ongoing  6.  Patient will demonstrate improvement in Patient Specific Functional Scale (PSFS) by equal or greater than 3/10 points to reflect clinically significant improvement in patient's most valued functional activities. Baseline: 1.7/10 for Walking Around [address], Enjoying Life, and Driving (95/62/13); 0.8/65 (11/25/2023);  Goal status: progressing    PLAN:  PT FREQUENCY: 1-2x/week  PT DURATION: 12 weeks  PLANNED INTERVENTIONS: Therapeutic exercises, Therapeutic activity, Neuromuscular re-education, Balance training, Gait training, Patient/Family education, Self Care, Joint mobilization, Stair training, Orthotic/Fit training, DME instructions, Aquatic Therapy, Dry Needling, Electrical stimulation, Wheelchair mobility training, Spinal mobilization, Cryotherapy, Moist heat, Manual therapy, and Re-evaluation.  PLAN FOR NEXT SESSION:  update HEP as appropriate, neurodynamics, gait training, LE/core/functional strengthening, stretching, and balance as tolerated, education, manual therapy as needed. Pelvic floor.    Carilyn Charles. Artemio Larry, PT, DPT 12/16/23, 5:18 PM  [facility] [address] [place], [place] [address] P: [phone] I F: [phone]

## 2023-12-18 ENCOUNTER — Encounter: Payer: Medicare Other | Attending: Physical Medicine & Rehabilitation | Admitting: Physical Medicine & Rehabilitation

## 2023-12-18 ENCOUNTER — Encounter: Payer: Self-pay | Admitting: Physical Therapy

## 2023-12-18 ENCOUNTER — Ambulatory Visit: Admitting: Physical Therapy

## 2023-12-18 ENCOUNTER — Encounter: Payer: Self-pay | Admitting: Physical Medicine & Rehabilitation

## 2023-12-18 VITALS — BP 147/94 | HR 111 | Ht 70.0 in | Wt 342.0 lb

## 2023-12-18 DIAGNOSIS — Z79899 Other long term (current) drug therapy: Secondary | ICD-10-CM | POA: Insufficient documentation

## 2023-12-18 DIAGNOSIS — R252 Cramp and spasm: Secondary | ICD-10-CM | POA: Diagnosis not present

## 2023-12-18 DIAGNOSIS — R262 Difficulty in walking, not elsewhere classified: Secondary | ICD-10-CM

## 2023-12-18 DIAGNOSIS — M5459 Other low back pain: Secondary | ICD-10-CM

## 2023-12-18 DIAGNOSIS — R29818 Other symptoms and signs involving the nervous system: Secondary | ICD-10-CM

## 2023-12-18 DIAGNOSIS — G039 Meningitis, unspecified: Secondary | ICD-10-CM | POA: Diagnosis not present

## 2023-12-18 DIAGNOSIS — M5104 Intervertebral disc disorders with myelopathy, thoracic region: Secondary | ICD-10-CM | POA: Diagnosis present

## 2023-12-18 DIAGNOSIS — M4714 Other spondylosis with myelopathy, thoracic region: Secondary | ICD-10-CM | POA: Diagnosis present

## 2023-12-18 DIAGNOSIS — M6281 Muscle weakness (generalized): Secondary | ICD-10-CM

## 2023-12-18 DIAGNOSIS — M47816 Spondylosis without myelopathy or radiculopathy, lumbar region: Secondary | ICD-10-CM | POA: Insufficient documentation

## 2023-12-18 DIAGNOSIS — R296 Repeated falls: Secondary | ICD-10-CM

## 2023-12-18 DIAGNOSIS — M25551 Pain in right hip: Secondary | ICD-10-CM | POA: Insufficient documentation

## 2023-12-18 DIAGNOSIS — M5416 Radiculopathy, lumbar region: Secondary | ICD-10-CM

## 2023-12-18 MED ORDER — BACLOFEN 10 MG PO TABS: 10.0000 mg | ORAL_TABLET | Freq: Two times a day (BID) | ORAL | Status: DC

## 2023-12-18 NOTE — Therapy (Signed)
 OUTPATIENT PHYSICAL THERAPY TREATMENT   Patient Name: Travis Fitzpatrick MRN: [id number] DOB:1980-11-28, 43 y.o., male Today's Date: 12/18/23   END OF SESSION:  PT End of Session - 12/18/23 1721     Visit Number 62    Number of Visits 70    Date for PT Re-Evaluation 02/17/24    Authorization Type MEDICARE PART B reporting period from 12/04/23    Progress Note Due on Visit 60    PT Start Time 1607    PT Stop Time 1700    PT Time Calculation (min) 53 min    Activity Tolerance Patient limited by pain;Patient tolerated treatment well    Behavior During Therapy [facility] for tasks assessed/performed               Past Medical History:  Diagnosis Date   Bowel trouble    urgency   Medical history non-contributory    Urinary urgency    Past Surgical History:  Procedure Laterality Date   BACK SURGERY  2010   CIRCUMCISION     LUMBAR LAMINECTOMY/DECOMPRESSION MICRODISCECTOMY  07/20/2011   Procedure: LUMBAR LAMINECTOMY/DECOMPRESSION MICRODISCECTOMY;  Surgeon: Pasty Bongo;  Location: [facility];  Service: Neurosurgery;  Laterality: N/A;  right thoracotomy with thoracic eight-nine discectomy and fusion   LUMBAR LAMINECTOMY/DECOMPRESSION MICRODISCECTOMY Right 10/06/2021   Procedure: Right Lumbar Four-Five Microdiscectomy, Right Lumbar Five-Sacal One Foraminotomy;  Surgeon: Audie Bleacher, MD;  Location: [facility];  Service: Neurosurgery;  Laterality: Right;  3C/RM 21   THORACIC DISCECTOMY  07/16/2012   Procedure: THORACIC DISCECTOMY;  Surgeon: Pasty Bongo, MD;  Location: [facility];  Service: Neurosurgery;  Laterality: Right;  RIGHT Thoracic seven-eight  thoracic diskectomy via thoracotomy by dr Sherene Dilling   THORACIC DISCECTOMY N/A 12/15/2014   Procedure: THORACIC SEVEN TO THORACIC NINE Laminectomy ;  Surgeon: Audie Bleacher, MD;  Location: [facility];  Service: Neurosurgery;  Laterality: N/A;   THORACIC DISCECTOMY N/A 06/06/2017   Procedure: LAMINECTOMY THORACIC NINE-TEN;  Surgeon: Audie Bleacher, MD;  Location: [facility];  Service: Neurosurgery;  Laterality: N/A;  LAMINECTOMY THORACIC NINE-TEN   THORACOTOMY  07/20/2011   Procedure: THORACOTOMY OPEN FOR SPINE SURGERY;  Surgeon: Gilmore Lager, MD;  Location: [facility];  Service: Vascular;  Laterality: N/A;   THORACOTOMY  07/16/2012   Procedure: THORACOTOMY OPEN FOR SPINE SURGERY;  Surgeon: Bartley Lightning, MD;  Location: [facility];  Service: Thoracic;  Laterality: N/A;   Patient Active Problem List   Diagnosis Date Noted   Spasticity 09/25/2023   Nerve pain 12/05/2022   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   S/P lumbar spinal fusion 05/25/2022   Synovial cyst of lumbar facet joint 05/25/2022   HNP (herniated nucleus pulposus), lumbar 10/06/2021   Hand pain 06/28/2021   Lumbar facet arthropathy 01/11/2021   Thoracic spondylosis with myelopathy 01/11/2021   Thoracic spinal stenosis 06/06/2017   Stenosis, spinal, thoracic 12/15/2014   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 09/22/2014   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/2011    PCP: Cherisse Cornell  REFERRING PROVIDER: Rawland Caddy, MD  REFERRING DIAG: thoracic disc disease with myelopathy, nerve pain, adhesive arachnoiditis  Rationale for Evaluation and Treatment: Rehabilitation  THERAPY DIAG:  Other low back pain  Other symptoms and signs involving the nervous system  Radiculopathy, lumbar region  Muscle weakness (generalized)  Repeated falls  Difficulty in walking, not elsewhere classified  ONSET DATE:  Suddenly started having weakness 11 years ago, most recent episode of worsening  October 2022, s/p right L4-5 laminotomy, microdiscectomy on 10/06/2021, now s/p L4-5 PLIF by Dr. Michale Age on 05/25/2022.  PERTINENT HISTORY:  Patient is a 43 y.o. male who presents to outpatient physical therapy with a referral for medical diagnosis thoracic disc disease with myelopathy, nerve pain, adhesive arachnoiditis. This patient's  chief complaints consist of disabling low back and R >L leg pain and weakness with activity, bowel and bladder urgency, incontinence, and retention, leading to the following functional deficits: difficulty with or unable to complete any activity that requires weight bearing, use of B LE, and/or balance including working, household and community mobility, walking, driving, going to family gatherings, avoiding incontinent episodes, playing with daughter, helping around the house, bed mobility, transfers. 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 Michale Age on 05/25/2022, adhesive arachnoiditis, nerve pain.  Patient denies hx of cancer, stroke, seizures, lung problems, heart problems, diabetes, unexplained weight loss, and osteoporosis.  SUBJECTIVE:                                                                                                                                                                                           SUBJECTIVE STATEMENT:     Patient reports he is feeling okay today. He states Dr. Rachel Budds examined him and said everything looked good. Patient states Dr. Rachel Budds said patient needs to strengthen his ankles to help his R hip pain, and he needs to get his ankles stronger before he can drive. He also said patient needs to stretch into extension more to combat the pulling sensation patient feels when he does this that feels like something is pulling up from the backs of his legs. However, patients states when he does this, his legs go numb, the pain gets worse in his legs, and  he cannot walk. Patient states when he told Dr. Rachel Budds this he said stretching it more would help. Patient states he asked Dr. Rachel Budds about the right thumb and index finger paresthesia he experiences with  photophobia and phonophobia and getting lighteheaded that happens when he has worse leg weakness and symptoms (and seem related to his condition to patient), and Dr. Rachel Budds told him he didn't think it was related because the adhesive arachnoiditis is in his low back. Pateint states he and his wife have worked  to find information about adhesive arachnoiditis and saw information that the photophobia, nausea, and photosensitivity are sometimes symptoms of adhesive arachnoiditis. He was hospitalized for these symptoms last year and his doctors at the hospital never identified the cause. He understood in the past that Dr. Rachel Budds told him these symptoms were likely from the adhesive arachnoiditis, so he was confused when he understood him to mean they are not at his last visit.   PAIN:  NPRS: 2/10 mostly in low back, right groin/hip, bilateral posterior thighs, right calf.   PRECAUTIONS: Fall  PATIENT GOALS: "To get better, walk, drive again, climb stairs, play with my daughter, help around the house again"  NEXT MD VISIT:   OBJECTIVE  STRENGTH (MMT):  Ankle (seated position) Dorsiflexion: R = 2/5, L = 2+/5. Plantarflexion: R = 3+/5, L = 3/5. Eversion: R = 1+/5, L = 1/5. Inversion: R = 2/5, L = 1/5.   TREATMENT Manual therapy: to reduce pain and tissue tension, improve range of motion, neuromodulation, in order to promote improved ability to complete functional activities.  Hooklying R hip caudal/posterior glide with belt, 4x45 seconds, grade IV  Therapeutic exercise: therapeutic exercises that incorporate ONE parameter at one or more areas of the body to centralize symptoms, develop strength and endurance, range of motion, and flexibility required for successful completion of functional  activities.  MMT to assess ankle strength (see above) Education on role of nerve function and strengthening for muscule strength in relation to his ankles.   Trial of various positions and methods to improve activation/strength of ankle muscles.  B ankle eversion with small children's ball between ankles and YTB around forefeet with heels elevated (to get eversion strengthening in plantar flexed position).  1x10 able to move R ankle, but not left against band.  Unable to keep left in neutral Difficulty maintaining ball between ankles Seated plantar flexion against blue theraband held at both ends with knee extended and foot off the floor.  3x20 each side Added to HEP   Seated ankle dorsiflexion 1x20 B LE R ankle moving better than L (unable to consistently clear floor) Added to HEP  Attempted B eversion with YTB loop and ball between ankles with feet flat on the floor (moving slightly into dorsiflexion to move to eversion) Unable to keep ball between ankles Unable to move L LE Difficulty moving R LE Discontinued  Sidelying L ankle eversion and R ankle inversion against gravity 1x20 each (able to move through AROM) Added to HEP  Attempted calf stretch with strap in seated position with ankle resting on the floor R LE: unable to tolerate due to nerve tension causing pain in shin, back of thigh and buttocks. (Discontinued)  Updated HEP to include seated and lying versions of ankle strengthening to help patient do it more frequently throughout the day.    Pt required multimodal cuing for proper technique and to facilitate improved neuromuscular control, strength, range of motion, and functional ability resulting in improved performance and form.   PATIENT EDUCATION:  Education details: Exercise purpose/form. Self management techniques.  Person educated: patient Education method: Explanation Education comprehension: verbalized understanding and needs further education  HOME  EXERCISE PROGRAM: Verbally:  - seated lumbar flexion stretch with head down and one LE extended using rollator, 1x10 each side.  - curl up with legs elevated, 5x15 seconds - sidelying open book 1x10 each side  Access Code: [email or web address] URL: [email or web address] Date: 12/18/2023 Prepared by: Alleen Isle  Exercises - Seated Diaphragmatic Breathing  -  3 reps - 1 minute practice time - Seated Pelvic Floor Elevators  - 3 x daily - 2 sets - 10 reps - Seated Ankle Plantar Flexion with Resistance Loop  - 1 x daily - 3 sets - 20 reps - Seated Ankle Dorsiflexion AROM  - 1 x daily - 3 sets - 10-30 reps - Seated Ankle Inversion AROM  - 1 x daily - 3 sets - 10-30 reps - Seated Ankle Eversion AROM  - 1 x daily - 3 sets - 10-30 reps - Sidelying Ankle Eversion Strengthening with Ankle Weight  - 1 x daily - 3 sets - 20 reps - Sidelying Ankle Inversion with Ankle Weight  - 1 x daily - 3 sets - 20 reps - Seated Ankle Dorsiflexion with Ankle Weight  - 1 x daily - 3 sets - 20 reps  ASSESSMENT:  CLINICAL IMPRESSION:     Provided patient with ankle strengthening program as suggested by MD. However, patient's weakness in the B LE appears directly related to lack of full nerve function there and prognoses for improvement with this program is highly guarded. Today he had better motion and ability to activate R ankle motions compared to left. He also had at least 1/5 MMT in all motions. However, simply the amount of stimulus his ankles get with his mobiltiy and ambulation around the home should have already strengthened his ankles more than what the currently demonstrate. Patient had severe difficulty activating left ankle inverters and everters constituting 1/5 MMT. Patient appeared to understand the exercises and be able to perform them with difficulty due to lack of effective innervation. Patient would benefit from continued management of limiting condition by skilled physical therapist to address  remaining impairments and functional limitations to work towards stated goals and return to PLOF or maximal functional independence.    OBJECTIVE IMPAIRMENTS: Abnormal gait, decreased activity tolerance, decreased balance, decreased coordination, decreased endurance, decreased knowledge of condition, decreased knowledge of use of DME, decreased mobility, difficulty walking, decreased ROM, decreased strength, increased edema, impaired perceived functional ability, increased muscle spasms, impaired flexibility, impaired sensation, impaired tone, improper body mechanics, postural dysfunction, obesity, and pain.   ACTIVITY LIMITATIONS: carrying, lifting, bending, standing, squatting, stairs, transfers, bed mobility, continence, bathing, toileting, dressing, hygiene/grooming, locomotion level, and caring for others  PARTICIPATION LIMITATIONS: meal prep, cleaning, laundry, interpersonal relationship, driving, shopping, community activity, occupation, yard work, and   difficulty with or unable to complete any activity that requires weight bearing, use of B LE, and/or balance including working, household and community mobility, walking, driving, going to family gatherings, avoiding incontinent episodes, playing with daughter, helping around the house, bed mobility, transfers  PERSONAL FACTORS: Past/current experiences, Time since onset of injury/illness/exacerbation, and 3+ comorbidities:   3 thoracic spine surgeries, thoracic MRI notes "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 Michale Age on 05/25/2022, adhesive arachnoiditis, nerve pain are also affecting patient's functional outcome.   REHAB  POTENTIAL: Fair due to severity and nature of condition.   CLINICAL DECISION MAKING: Evolving/moderate complexity  EVALUATION COMPLEXITY: Moderate   GOALS: Goals reviewed with patient? No  SHORT TERM GOALS: Target date: 01/09/2023. Target date updated to 05/28/2023 for all unmet goals on 03/05/2023.   Patient will be independent  with initial home exercise program for self-management of symptoms. Baseline: Initial HEP to be provided at visit 2 as appropriate (12/26/22); Goal status: MET   LONG TERM GOALS: Target date: 03/20/2023. Target date updated to 08/26/23  for all unmet goals on 06/03/2023. Target date updated to 11/20/2023 for all  unmet goals on 08/28/2023. Target date updated to 02/17/2024  for all unmet goals on 11/25/2023.  Patient will be independent with a long-term home exercise program for self-management of symptoms.  Baseline: Initial HEP to be provided at visit 2 as appropriate (12/26/22); participating as able (01/31/2023); patient currently participating as tolerated (03/05/2023; 04/09/2023; 06/03/2023); participates daily in mat exercises and in weight bearing exercises as tolerated (10/08/2023; 11/25/2023);  Goal status: In-progress  2.  Patient will demonstrate improved FOTO by equal or greater than 10 points by visit #13 to demonstrate improvement in overall condition and self-reported functional ability.  Baseline: to be tested visit 2 as appropriate (12/26/22); 30 at visit #3 (01/02/2023); 46 at visit #13 (03/05/2023); 45 at visit #29 (06/03/2023); 49 at visit #50 (10/08/2023);  Goal status: MET  3.  Patient will demonstrate the ability to ambulate equal or greater than 600 feet with LRAD during the 6 minute walk to improve his household and community mobility.  Baseline: 34 feet with bari-RW (12/26/22); 200 feet with bari-RW (01/31/2023); 500 feet with BRW (03/05/2023); 400 feet with BRW (04/09/2023); 152 feet with BRW and W/C follow for safety (06/03/2023); 330 feet with BRW  (10/08/2023); 245 feet with BRW and SBA for safety (11/25/2023);  Goal status: Ongoing  4.  Patient will complete 5 Time Sit To Stand Test from 19.5 inch surface or lower in equal or less than 15 seconds with no UE support to demonstrate improved transfer ability for improved household and toileting mobility.  Baseline: 23 seconds with heavy B UE support on RW from 19.5 inch plinth. Pain throbbing down the right LE.  (12/26/22); 19 seconds with heavy B UE support on RW from 19.5 inch plinth (03/05/2023); 20 seconds with heavy B UE support on RRW from 18.5 inch plinth. Painful in back of B calves, thighs, and lower back (04/09/2023); 15 seconds with heavy B UE support on BRW from 18.5 inch plinth. Painful in back of B calves, thighs, and lower back (06/03/2023); 19 seconds with heavy B UE support on BRW from 18.5 inch plinth. Painful in back of B calves, thighs, and lower back (10/08/2023); 18 seconds with heavy B UE support on BRW from 18.5 inch plinth. Painful in back of B calves, thighs, and lower back (11/25/2023);  Goal status: Progressing   5.  Patient will report worst pain equal or less than 3/10 with functional activities to improve his ability to complete basic household and community mobility. Baseline: up to 8/10 (12/26/22); reports 3/10 pain (01/31/2023); reports 4/10 pain (02/19/2023); up to 7/10 in the last 2 week s(03/05/2023): up to  7/10 in the last two weeks (04/09/2023); up to 8/10 in the last 2 weeks (06/03/2023);  up to 9/10 over the last 2 weeks (10/08/2023); up to 7-8/10 in the last 2 weeks (11/25/2023); Goal status: Ongoing  6.  Patient will demonstrate improvement in Patient Specific Functional Scale (PSFS) by equal or greater than 3/10 points to reflect clinically significant improvement in patient's most valued functional activities. Baseline: 1.7/10 for Walking Around [address], Enjoying Life, and Driving (84/13/24); 4.0/10 (11/25/2023);  Goal status: progressing    PLAN:  PT FREQUENCY:  1-2x/week  PT DURATION: 12 weeks  PLANNED INTERVENTIONS: Therapeutic exercises, Therapeutic activity, Neuromuscular re-education, Balance training, Gait training, Patient/Family education, Self Care, Joint mobilization, Stair training, Orthotic/Fit training, DME instructions, Aquatic Therapy, Dry Needling, Electrical stimulation, Wheelchair mobility training, Spinal mobilization, Cryotherapy, Moist heat, Manual therapy, and Re-evaluation.  PLAN FOR NEXT SESSION:  update HEP as appropriate, neurodynamics, gait training, LE/core/functional strengthening, stretching, and balance as tolerated, education, manual therapy as needed. Pelvic floor.    Carilyn Charles. Artemio Larry, PT, DPT 12/18/23, 5:36 PM  [facility] [address] [place], [place] [address] P: [phone] I F: [phone]

## 2023-12-18 NOTE — Progress Notes (Signed)
 Subjective:    Patient ID: Travis Fitzpatrick, male    DOB: 1980-11-09, 43 y.o.   MRN: [id number]  HPI  Travis Fitzpatrick is here in follow up of his thoracic myelopathy and associated paraparesis and pain. He has been busy with outpt therapies. He still has hip and groin pain pain as well as weakness in his right hip. He will experience pain radiating from his back down both legs, right more than left.   He remains on cymbalta  and diclofenac  as well as pamelor  for nerve pain. He is also on baclofen  10mg  bid as tid made him sleepy. The medication is helping. His pain is much better.   He still has some resicual stool left when he empties. He doesn't use a supppository  He's interested in driving. He has looked into hand controls    Pain Inventory Average Pain 2 Pain Right Now 2 My pain is constant and stabbing  In the last 24 hours, has pain interfered with the following? General activity 3 Relation with others 3 Enjoyment of life 4 What TIME of day is your pain at its worst? morning  and evening Sleep (in general) Fair  Pain is worse with: walking, standing, and some activites Pain improves with: rest and medication Relief from Meds: 4  Family History  Problem Relation Age of Onset   Hypertension Father    Social History   Socioeconomic History   Marital status: Single    Spouse name: Not on file   Number of children: Not on file   Years of education: Not on file   Highest education level: Not on file  Occupational History   Not on file  Tobacco Use   Smoking status: Never   Smokeless tobacco: Never  Vaping Use   Vaping status: Never Used  Substance and Sexual Activity   Alcohol use: No    Alcohol/week: 0.0 standard drinks of alcohol   Drug use: No   Sexual activity: Never  Other Topics Concern   Not on file  Social History Narrative   Not on file   Social Drivers of Health   Financial Resource Strain: Medium Risk (08/30/2022)   Received from [facility], [facility]   Overall Financial Resource Strain (CARDIA)    Difficulty of Paying Living Expenses: Somewhat hard  Food Insecurity: Food Insecurity Present (08/30/2022)   Received from [facility], [facility]   Hunger Vital Sign    Worried About Running Out of Food in the Last Year: Never true    Ran Out of Food in the Last Year: Sometimes true  Transportation Needs: No Transportation Needs (01/25/2023)   Received from [facility], [facility]   [facility] - Transportation    Lack of Transportation (Medical): No    Lack of Transportation (Non-Medical): No  Physical Activity: Not on file  Stress: Not on file  Social Connections: Not on file   Past Surgical History:  Procedure Laterality Date   BACK SURGERY  2010   CIRCUMCISION     LUMBAR LAMINECTOMY/DECOMPRESSION MICRODISCECTOMY  07/20/2011   Procedure: LUMBAR LAMINECTOMY/DECOMPRESSION MICRODISCECTOMY;  Surgeon: Pasty Bongo;  Location: [facility];  Service: Neurosurgery;  Laterality: N/A;  right thoracotomy with thoracic eight-nine discectomy and fusion   LUMBAR LAMINECTOMY/DECOMPRESSION MICRODISCECTOMY Right 10/06/2021   Procedure: Right Lumbar Four-Five Microdiscectomy, Right Lumbar Five-Sacal One Foraminotomy;  Surgeon: Audie Bleacher, MD;  Location: [facility];  Service: Neurosurgery;  Laterality:  Right;  3C/RM 21   THORACIC DISCECTOMY  07/16/2012   Procedure: THORACIC DISCECTOMY;  Surgeon: Pasty Bongo, MD;  Location: [facility];  Service: Neurosurgery;  Laterality: Right;  RIGHT Thoracic seven-eight  thoracic diskectomy via thoracotomy by dr Sherene Dilling   THORACIC DISCECTOMY N/A 12/15/2014   Procedure: THORACIC SEVEN TO THORACIC NINE Laminectomy ;  Surgeon: Audie Bleacher, MD;  Location: [facility];  Service: Neurosurgery;  Laterality: N/A;   THORACIC DISCECTOMY N/A 06/06/2017   Procedure: LAMINECTOMY THORACIC NINE-TEN;  Surgeon: Audie Bleacher, MD;  Location: [facility];   Service: Neurosurgery;  Laterality: N/A;  LAMINECTOMY THORACIC NINE-TEN   THORACOTOMY  07/20/2011   Procedure: THORACOTOMY OPEN FOR SPINE SURGERY;  Surgeon: Gilmore Lager, MD;  Location: [facility];  Service: Vascular;  Laterality: N/A;   THORACOTOMY  07/16/2012   Procedure: THORACOTOMY OPEN FOR SPINE SURGERY;  Surgeon: Bartley Lightning, MD;  Location: [facility];  Service: Thoracic;  Laterality: N/A;   Past Surgical History:  Procedure Laterality Date   BACK SURGERY  2010   CIRCUMCISION     LUMBAR LAMINECTOMY/DECOMPRESSION MICRODISCECTOMY  07/20/2011   Procedure: LUMBAR LAMINECTOMY/DECOMPRESSION MICRODISCECTOMY;  Surgeon: Pasty Bongo;  Location: [facility];  Service: Neurosurgery;  Laterality: N/A;  right thoracotomy with thoracic eight-nine discectomy and fusion   LUMBAR LAMINECTOMY/DECOMPRESSION MICRODISCECTOMY Right 10/06/2021   Procedure: Right Lumbar Four-Five Microdiscectomy, Right Lumbar Five-Sacal One Foraminotomy;  Surgeon: Audie Bleacher, MD;  Location: [facility];  Service: Neurosurgery;  Laterality: Right;  3C/RM 21   THORACIC DISCECTOMY  07/16/2012   Procedure: THORACIC DISCECTOMY;  Surgeon: Pasty Bongo, MD;  [facility]: [facility];  Service: Neurosurgery;  Laterality: Right;  RIGHT Thoracic seven-eight  thoracic diskectomy via thoracotomy by dr Sherene Dilling   THORACIC DISCECTOMY N/A 12/15/2014   Procedure: THORACIC SEVEN TO THORACIC NINE Laminectomy ;  Surgeon: Audie Bleacher, MD;  Location: [facility];  Service: Neurosurgery;  Laterality: N/A;   THORACIC DISCECTOMY N/A 06/06/2017   Procedure: LAMINECTOMY THORACIC NINE-TEN;  Surgeon: Audie Bleacher, MD;  [facility]: [facility];  Service: Neurosurgery;  Laterality: N/A;  LAMINECTOMY THORACIC NINE-TEN   THORACOTOMY  07/20/2011   Procedure: THORACOTOMY OPEN FOR SPINE SURGERY;  Surgeon: Baker Bon Harrietta Lime, MD;  Location: [facility];  Service: Vascular;  Laterality: N/A;   THORACOTOMY  07/16/2012   Procedure: THORACOTOMY OPEN FOR SPINE SURGERY;   Surgeon: Bartley Lightning, MD;  Location: [facility];  Service: Thoracic;  Laterality: N/A;   Past Medical History:  Diagnosis Date   Bowel trouble    urgency   Medical history non-contributory    Urinary urgency    BP (!) 147/94   Pulse (!) 111   Ht 5\' 10"  (1.778 m)   Wt (!) 342 lb (155.1 kg)   SpO2 96%   BMI 49.07 kg/m   Opioid Risk Score:   Fall Risk Score:  `1  Depression screen PHQ 2/9     03/13/2023    1:10 PM 12/19/2022    2:41 PM 12/05/2022   10:16 AM 06/28/2021    9:29 AM 03/29/2021    9:39 AM 01/11/2021   10:19 AM  Depression screen PHQ 2/9  Decreased Interest 0 0 0 0 1 3  Down, Depressed, Hopeless 0 0 0 0 1 1  PHQ - 2 Score 0 0 0 0 2 4  Altered sleeping      3  Tired, decreased energy      3  Change in  appetite      3  Feeling bad or failure about yourself       0  Trouble concentrating      2  Moving slowly or fidgety/restless      1  Suicidal thoughts      0  PHQ-9 Score      16      Review of Systems  Musculoskeletal:  Negative for back pain.       Buttocks pain Back of leg pain Pelvic pain  All other systems reviewed and are negative.     Objective:   Physical Exam   General: Alert and oriented x 3, No apparent distress HEENT: Head is normocephalic, atraumatic, PERRLA, EOMI, sclera anicteric, oral mucosa pink and moist, dentition intact, ext ear canals clear,  Neck: Supple without JVD or lymphadenopathy Heart: Reg rate and rhythm. No murmurs rubs or gallops Chest: CTA bilaterally without wheezes, rales, or rhonchi; no distress Abdomen: Soft, non-tender, non-distended, bowel sounds positive. Extremities: No clubbing, cyanosis, or edema. Pulses are 2+ Psych: Pt's affect is appropriate. Pt is cooperative Skin: Clean and intact without signs of breakdown Neuro: Alert and oriented x 3. Normal insight and awareness. Intact Memory. Normal language and speech. Cranial nerve exam unremarkable. MMT: BUE 5/5. RLE HF 2l, KE 2, ADF/PF 1. LLE 3 to 3+HF, KE  and 3/5 ADF/PF. Decreased sensation below mid abdomen.  Left knee hyperextends and hip flexes during stance.  Musculoskeletal: no jt pain.        Assessment & Plan:  1. Hx of thoracic myelopathy with residual sensory deficits from trunk down.  He has a T8 sensory level on exam 2.  Lumbar spondylosis/ddd s/p decompression and fusion  3.  Right hip pain concerning for OA 4. See no sign of neurogenic bowel or bladder. Pt does have back pain which he pushes to empty his bowels on toilet. 5. Morbid obesity 6. Adhesive arachnoiditis.    Plan: 1.  Maintain gabapentin  to 600 mg qid.   2.  Maintain nortriptyline  to 25mg  qhs 2.  Continue diclofenac  75 mg twice daily with food.   3.  [employer] Nexwave device for low back and buttock pain 4.  continue outpt therapy to address LE strengthn rom, pain, etc. Dicussed the importance of staying active. 5.  continue cymbalta  to 90mg  daily. 6.  We discussed suppository for helping empty bowels completely. He's not too keen on that though.  7. continue baclofen  10mg  bid for spasms. This helps     20+ minutes of phone time were spent during this visit. All questions were encouraged and answered. Follow up with me in 3 mos.

## 2023-12-18 NOTE — Patient Instructions (Signed)
 ALWAYS FEEL FREE TO CALL OUR OFFICE WITH ANY PROBLEMS OR QUESTIONS [phone])  **PLEASE NOTE** ALL MEDICATION REFILL REQUESTS (INCLUDING CONTROLLED SUBSTANCES) NEED TO BE MADE AT LEAST 7 DAYS PRIOR TO REFILL BEING DUE. ANY REFILL REQUESTS INSIDE THAT TIME FRAME MAY RESULT IN DELAYS IN RECEIVING YOUR PRESCRIPTION.

## 2023-12-19 ENCOUNTER — Telehealth: Payer: Self-pay | Admitting: Physical Medicine & Rehabilitation

## 2023-12-19 NOTE — Telephone Encounter (Signed)
 Bryan from [employer] is calling on behalf of pt to request UTD orders for an electrical therapy device. He states that the original orders have lapsed as it has been over 6mos. Orders can be faxed to [phone]. His line is [phone] if any questions.

## 2023-12-20 ENCOUNTER — Telehealth: Payer: Self-pay | Admitting: Registered Nurse

## 2023-12-20 NOTE — Telephone Encounter (Signed)
 [employer] prescription Faxed; Per Dr Rachel Budds request.  Call placed to representative, no answer. Left message to return the call.

## 2023-12-24 ENCOUNTER — Ambulatory Visit

## 2023-12-24 DIAGNOSIS — R296 Repeated falls: Secondary | ICD-10-CM

## 2023-12-24 DIAGNOSIS — M5416 Radiculopathy, lumbar region: Secondary | ICD-10-CM

## 2023-12-24 DIAGNOSIS — R29818 Other symptoms and signs involving the nervous system: Secondary | ICD-10-CM

## 2023-12-24 DIAGNOSIS — M5459 Other low back pain: Secondary | ICD-10-CM

## 2023-12-24 DIAGNOSIS — M6281 Muscle weakness (generalized): Secondary | ICD-10-CM

## 2023-12-24 DIAGNOSIS — R262 Difficulty in walking, not elsewhere classified: Secondary | ICD-10-CM

## 2023-12-24 DIAGNOSIS — R2689 Other abnormalities of gait and mobility: Secondary | ICD-10-CM

## 2023-12-24 NOTE — Therapy (Signed)
 OUTPATIENT PHYSICAL THERAPY TREATMENT   Patient Name: Travis Fitzpatrick MRN: [id number] DOB:12/29/1980, 43 y.o., male Today's Date: 12/24/23   END OF SESSION:  PT End of Session - 12/24/23 1820     Visit Number 63    Number of Visits 70    Date for PT Re-Evaluation 02/17/24    Authorization Type MEDICARE PART B reporting period from 12/04/23    Progress Note Due on Visit 60    PT Start Time 1650    PT Stop Time 1730    PT Time Calculation (min) 40 min    Activity Tolerance Patient limited by pain;Patient tolerated treatment well    Behavior During Therapy [facility] for tasks assessed/performed                Past Medical History:  Diagnosis Date   Bowel trouble    urgency   Medical history non-contributory    Urinary urgency    Past Surgical History:  Procedure Laterality Date   BACK SURGERY  2010   CIRCUMCISION     LUMBAR LAMINECTOMY/DECOMPRESSION MICRODISCECTOMY  07/20/2011   Procedure: LUMBAR LAMINECTOMY/DECOMPRESSION MICRODISCECTOMY;  Surgeon: Pasty Bongo;  Location: [facility];  Service: Neurosurgery;  Laterality: N/A;  right thoracotomy with thoracic eight-nine discectomy and fusion   LUMBAR LAMINECTOMY/DECOMPRESSION MICRODISCECTOMY Right 10/06/2021   Procedure: Right Lumbar Four-Five Microdiscectomy, Right Lumbar Five-Sacal One Foraminotomy;  Surgeon: Audie Bleacher, MD;  Location: [facility];  Service: Neurosurgery;  Laterality: Right;  3C/RM 21   THORACIC DISCECTOMY  07/16/2012   Procedure: THORACIC DISCECTOMY;  Surgeon: Pasty Bongo, MD;  Location: [facility];  Service: Neurosurgery;  Laterality: Right;  RIGHT Thoracic seven-eight  thoracic diskectomy via thoracotomy by dr Sherene Dilling   THORACIC DISCECTOMY N/A 12/15/2014   Procedure: THORACIC SEVEN TO THORACIC NINE Laminectomy ;  Surgeon: Audie Bleacher, MD;  Location: [facility];  Service: Neurosurgery;  Laterality: N/A;   THORACIC DISCECTOMY N/A 06/06/2017   Procedure: LAMINECTOMY THORACIC NINE-TEN;  Surgeon: Audie Bleacher, MD;  Location: [facility];  Service: Neurosurgery;  Laterality: N/A;  LAMINECTOMY THORACIC NINE-TEN   THORACOTOMY  07/20/2011   Procedure: THORACOTOMY OPEN FOR SPINE SURGERY;  Surgeon: Gilmore Lager, MD;  Location: [facility];  Service: Vascular;  Laterality: N/A;   THORACOTOMY  07/16/2012   Procedure: THORACOTOMY OPEN FOR SPINE SURGERY;  Surgeon: Bartley Lightning, MD;  Location: [facility];  Service: Thoracic;  Laterality: N/A;   Patient Active Problem List   Diagnosis Date Noted   Spasticity 09/25/2023   Nerve pain 12/05/2022   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   S/P lumbar spinal fusion 05/25/2022   Synovial cyst of lumbar facet joint 05/25/2022   HNP (herniated nucleus pulposus), lumbar 10/06/2021   Hand pain 06/28/2021   Lumbar facet arthropathy 01/11/2021   Thoracic spondylosis with myelopathy 01/11/2021   Thoracic spinal stenosis 06/06/2017   Stenosis, spinal, thoracic 12/15/2014   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 09/22/2014   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/2011    PCP: Cherisse Cornell  REFERRING PROVIDER: Rawland Caddy, MD  REFERRING DIAG: thoracic disc disease with myelopathy, nerve pain, adhesive arachnoiditis  Rationale for Evaluation and Treatment: Rehabilitation  THERAPY DIAG:  Other low back pain  Other symptoms and signs involving the nervous system  Radiculopathy, lumbar region  Muscle weakness (generalized)  Repeated falls  Difficulty in walking, not elsewhere classified  Other abnormalities of gait and mobility  ONSET DATE:  Suddenly started having weakness  11 years ago, most recent episode of worsening October 2022, s/p right L4-5 laminotomy, microdiscectomy on 10/06/2021, now s/p L4-5 PLIF by Dr. Michale Age on 05/25/2022.  PERTINENT HISTORY:  Patient is a 43 y.o. male who presents to outpatient physical therapy with a referral for medical diagnosis thoracic disc disease with myelopathy, nerve  pain, adhesive arachnoiditis. This patient's chief complaints consist of disabling low back and R >L leg pain and weakness with activity, bowel and bladder urgency, incontinence, and retention, leading to the following functional deficits: difficulty with or unable to complete any activity that requires weight bearing, use of B LE, and/or balance including working, household and community mobility, walking, driving, going to family gatherings, avoiding incontinent episodes, playing with daughter, helping around the house, bed mobility, transfers. 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 Michale Age on 05/25/2022, adhesive arachnoiditis, nerve pain.  Patient denies hx of cancer, stroke, seizures, lung problems, heart problems, diabetes, unexplained weight loss, and osteoporosis.  SUBJECTIVE:                                                                                                                                                                                           SUBJECTIVE STATEMENT:     Patient reports he is feeling okay today. He states Dr. Rachel Budds examined him and said everything looked good. Patient states Dr. Rachel Budds said patient needs to strengthen his ankles to help his R hip pain, and he needs to get his ankles stronger before he can drive. He also said patient needs to stretch into extension more to combat the pulling sensation patient feels when he does this that feels like something is pulling up from the backs of his legs. However, patients states when he does this, his legs  go numb, the pain gets worse in his legs, and he cannot walk. Patient states when he told Dr. Rachel Budds this he said stretching it more would help. Patient states he asked Dr. Rachel Budds about the right thumb and index finger paresthesia he experiences with  photophobia and phonophobia and getting lighteheaded that happens when he has worse leg weakness and symptoms (and seem related to his condition to patient), and Dr. Rachel Budds told him he didn't think it was related because the adhesive arachnoiditis is in his low back.  Pateint states he and his wife have worked to find information about adhesive arachnoiditis and saw information that the photophobia, nausea, and photosensitivity are sometimes symptoms of adhesive arachnoiditis. He was hospitalized for these symptoms last year and his doctors at the hospital never identified the cause. He understood in the past that Dr. Rachel Budds told him these symptoms were likely from the adhesive arachnoiditis, so he was confused when he understood him to mean they are not at his last visit.   PAIN:  NPRS: 2/10 mostly in low back, right groin/hip, bilateral posterior thighs, right calf.   PRECAUTIONS: Fall  PATIENT GOALS: "To get better, walk, drive again, climb stairs, play with my daughter, help around the house again"  NEXT MD VISIT:   OBJECTIVE  STRENGTH (MMT):  Ankle (seated position) Dorsiflexion: R = 2/5, L = 2+/5. Plantarflexion: R = 3+/5, L = 3/5. Eversion: R = 1+/5, L = 1/5. Inversion: R = 2/5, L = 1/5.   TREATMENT Manual therapy: to reduce pain and tissue tension, improve range of motion, neuromodulation, in order to promote improved ability to complete functional activities.  Hooklying R hip caudal/posterior glide with belt, 4x45 seconds, grade IV   Therapeutic exercise: therapeutic exercises that incorporate ONE parameter at one or more areas of the body to centralize symptoms, develop strength and endurance, range of motion, and flexibility  required for successful completion of functional activities.  Seated plantar flexion against black theraband held at both ends with knee extended and foot off the floor.  3x20 each side  Seated R ankle eversion into eversion. Black TB: 2x12  Seated L ankle eversion 2x12 AROM   Seated ankle dorsiflexion 1x20 B LE R ankle moving better than L (unable to consistently clear floor) Added to HEP  STS to RW: 3x5    Pt required multimodal cuing for proper technique and to facilitate improved neuromuscular control, strength, range of motion, and functional ability resulting in improved performance and form.   PATIENT EDUCATION:  Education details: Exercise purpose/form. Self management techniques.  Person educated: patient Education method: Explanation Education comprehension: verbalized understanding and needs further education  HOME EXERCISE PROGRAM: Verbally:  - seated lumbar flexion stretch with head down and one LE extended using rollator, 1x10 each side.  - curl up with legs elevated, 5x15 seconds - sidelying open book 1x10 each side  Access Code: [email or web address] URL: [email or web address] Date: 12/18/2023 Prepared by: Alleen Isle  Exercises - Seated Diaphragmatic Breathing  - 3 reps - 1 minute practice time - Seated Pelvic Floor Elevators  - 3 x daily - 2 sets - 10 reps - Seated Ankle Plantar Flexion with Resistance Loop  - 1 x daily - 3 sets - 20 reps - Seated Ankle Dorsiflexion AROM  - 1 x daily - 3 sets - 10-30 reps - Seated Ankle Inversion AROM  - 1 x daily - 3 sets - 10-30 reps - Seated Ankle Eversion AROM  - 1 x daily - 3 sets - 10-30 reps - Sidelying Ankle Eversion Strengthening with Ankle Weight  - 1 x daily - 3 sets - 20 reps - Sidelying Ankle Inversion with Ankle Weight  - 1 x daily - 3 sets - 20 reps - Seated Ankle Dorsiflexion with Ankle Weight  - 1 x daily - 3 sets - 20 reps  ASSESSMENT:  CLINICAL IMPRESSION:     Continuing PT POC working on ankle  strength and mobility. Pt remains markedly weak in B ankles but especially in L ankle eversion.  Appears to have more active ankle inversion this date as suspected from last author at last visit. Encouraged regular compliance and completion for stengthening as recommended by MD and primary PT. Patient would benefit from continued management of limiting condition by skilled physical therapist to address remaining impairments and functional limitations to work towards stated goals and return to PLOF or maximal functional independence.   OBJECTIVE IMPAIRMENTS: Abnormal gait, decreased activity tolerance, decreased balance, decreased coordination, decreased endurance, decreased knowledge of condition, decreased knowledge of use of DME, decreased mobility, difficulty walking, decreased ROM, decreased strength, increased edema, impaired perceived functional ability, increased muscle spasms, impaired flexibility, impaired sensation, impaired tone, improper body mechanics, postural dysfunction, obesity, and pain.   ACTIVITY LIMITATIONS: carrying, lifting, bending, standing, squatting, stairs, transfers, bed mobility, continence, bathing, toileting, dressing, hygiene/grooming, locomotion level, and caring for others  PARTICIPATION LIMITATIONS: meal prep, cleaning, laundry, interpersonal relationship, driving, shopping, community activity, occupation, yard work, and  difficulty with or unable to complete any activity that requires weight bearing, use of B LE, and/or balance including working, household and community mobility, walking, driving, going to family gatherings, avoiding incontinent episodes, playing with daughter, helping around the house, bed mobility, transfers  PERSONAL FACTORS: Past/current experiences, Time since onset of injury/illness/exacerbation, and 3+ comorbidities:  3 thoracic spine surgeries, thoracic MRI notes "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 Michale Age on 05/25/2022, adhesive arachnoiditis, nerve pain are also affecting patient's functional outcome.   REHAB POTENTIAL: Fair due to severity and nature of condition.   CLINICAL DECISION MAKING: Evolving/moderate complexity  EVALUATION COMPLEXITY: Moderate   GOALS: Goals reviewed with patient? No  SHORT TERM GOALS: Target date: 01/09/2023. Target date updated to 05/28/2023 for all unmet goals on 03/05/2023.   Patient will be independent with initial home exercise program for self-management of symptoms. Baseline: Initial HEP to be provided at visit 2 as appropriate (12/26/22); Goal status: MET   LONG TERM GOALS: Target date: 03/20/2023. Target date updated to 08/26/23  for all unmet goals on 06/03/2023. Target date updated to 11/20/2023 for all  unmet goals on 08/28/2023. Target date updated to 02/17/2024  for all unmet goals on 11/25/2023.  Patient will be independent with a long-term home exercise program for self-management of symptoms.  Baseline: Initial HEP to be provided at visit 2 as appropriate (12/26/22); participating as able (01/31/2023); patient currently participating as tolerated (03/05/2023; 04/09/2023; 06/03/2023); participates daily in mat exercises and in weight bearing exercises as tolerated (10/08/2023; 11/25/2023);  Goal status: In-progress  2.  Patient will demonstrate improved FOTO by equal or greater than 10 points by visit #13 to demonstrate improvement in overall condition and self-reported functional ability.  Baseline: to be tested visit 2 as appropriate (12/26/22); 30 at visit #3 (01/02/2023); 46 at visit #13 (03/05/2023); 45 at visit #29 (06/03/2023); 49 at visit #50 (10/08/2023);  Goal status:  MET  3.  Patient will demonstrate the ability to ambulate equal or greater than 600 feet with LRAD during the 6 minute walk to improve his household and community mobility.  Baseline: 34 feet with bari-RW (12/26/22); 200 feet with bari-RW (01/31/2023); 500 feet with BRW (03/05/2023); 400 feet with BRW (04/09/2023); 152 feet with BRW and W/C follow for safety (06/03/2023);  330 feet with BRW (10/08/2023); 245 feet with BRW and SBA for safety (11/25/2023);  Goal status: Ongoing  4.  Patient will complete 5 Time Sit To Stand Test from 19.5 inch surface or lower in equal or less than 15 seconds with no UE support to demonstrate improved transfer ability for improved household and toileting mobility.  Baseline: 23 seconds with heavy B UE support on RW from 19.5 inch plinth. Pain throbbing down the right LE.  (12/26/22); 19 seconds with heavy B UE support on RW from 19.5 inch plinth (03/05/2023); 20 seconds with heavy B UE support on RRW from 18.5 inch plinth. Painful in back of B calves, thighs, and lower back (04/09/2023); 15 seconds with heavy B UE support on BRW from 18.5 inch plinth. Painful in back of B calves, thighs, and lower back (06/03/2023); 19 seconds with heavy B UE support on BRW from 18.5 inch plinth. Painful in back of B calves, thighs, and lower back (10/08/2023); 18 seconds with heavy B UE support on BRW from 18.5 inch plinth. Painful in back of B calves, thighs, and lower back (11/25/2023);  Goal status: Progressing   5.  Patient will report worst pain equal or less than 3/10 with functional activities to improve his ability to complete basic household and community mobility. Baseline: up to 8/10 (12/26/22); reports 3/10 pain (01/31/2023); reports 4/10 pain (02/19/2023); up to 7/10 in the last 2 week s(03/05/2023): up to  7/10 in the last two weeks (04/09/2023); up to 8/10 in the last 2 weeks (06/03/2023);  up to 9/10 over the last 2 weeks (10/08/2023); up to 7-8/10 in the last 2 weeks (11/25/2023); Goal  status: Ongoing  6.  Patient will demonstrate improvement in Patient Specific Functional Scale (PSFS) by equal or greater than 3/10 points to reflect clinically significant improvement in patient's most valued functional activities. Baseline: 1.7/10 for Walking Around [address], Enjoying Life, and Driving (16/10/96); 0.4/54 (11/25/2023);  Goal status: progressing    PLAN:  PT FREQUENCY: 1-2x/week  PT DURATION: 12 weeks  PLANNED INTERVENTIONS: Therapeutic exercises, Therapeutic activity, Neuromuscular re-education, Balance training, Gait training, Patient/Family education, Self Care, Joint mobilization, Stair training, Orthotic/Fit training, DME instructions, Aquatic Therapy, Dry Needling, Electrical stimulation, Wheelchair mobility training, Spinal mobilization, Cryotherapy, Moist heat, Manual therapy, and Re-evaluation.  PLAN FOR NEXT SESSION:  update HEP as appropriate, neurodynamics, gait training, LE/core/functional strengthening, stretching, and balance as tolerated, education, manual therapy as needed. Pelvic floor.   Marc Senior. Fairly IV, PT, DPT Physical Therapist- [facility]  12/24/23, 6:22 PM

## 2023-12-26 ENCOUNTER — Ambulatory Visit

## 2023-12-26 ENCOUNTER — Encounter: Payer: Self-pay | Admitting: Physical Therapy

## 2023-12-26 DIAGNOSIS — M5459 Other low back pain: Secondary | ICD-10-CM

## 2023-12-26 DIAGNOSIS — R2689 Other abnormalities of gait and mobility: Secondary | ICD-10-CM

## 2023-12-26 DIAGNOSIS — M5416 Radiculopathy, lumbar region: Secondary | ICD-10-CM

## 2023-12-26 DIAGNOSIS — R29818 Other symptoms and signs involving the nervous system: Secondary | ICD-10-CM

## 2023-12-26 DIAGNOSIS — R296 Repeated falls: Secondary | ICD-10-CM

## 2023-12-26 DIAGNOSIS — R262 Difficulty in walking, not elsewhere classified: Secondary | ICD-10-CM

## 2023-12-26 DIAGNOSIS — G8929 Other chronic pain: Secondary | ICD-10-CM

## 2023-12-26 DIAGNOSIS — M6281 Muscle weakness (generalized): Secondary | ICD-10-CM

## 2023-12-26 DIAGNOSIS — M546 Pain in thoracic spine: Secondary | ICD-10-CM

## 2023-12-26 NOTE — Therapy (Signed)
 OUTPATIENT PHYSICAL THERAPY TREATMENT   Patient Name: ARVID DEGROOT MRN: [id number] DOB:[birth year], 43 y.o., male Today's Date: 12/26/23   END OF SESSION:  PT End of Session - 12/26/23 1650     Visit Number 64    Number of Visits 70    Date for PT Re-Evaluation 02/17/24    Authorization Type MEDICARE PART B reporting period from 12/04/23    Progress Note Due on Visit 60    PT Start Time 1650    PT Stop Time 1730    PT Time Calculation (min) 40 min    Activity Tolerance Patient limited by pain;Patient tolerated treatment well    Behavior During Therapy [facility] for tasks assessed/performed                Past Medical History:  Diagnosis Date   Bowel trouble    urgency   Medical history non-contributory    Urinary urgency    Past Surgical History:  Procedure Laterality Date   BACK SURGERY  2010   CIRCUMCISION     LUMBAR LAMINECTOMY/DECOMPRESSION MICRODISCECTOMY  07/20/2011   Procedure: LUMBAR LAMINECTOMY/DECOMPRESSION MICRODISCECTOMY;  Surgeon: Pasty Bongo;  Location: [facility];  Service: Neurosurgery;  Laterality: N/A;  right thoracotomy with thoracic eight-nine discectomy and fusion   LUMBAR LAMINECTOMY/DECOMPRESSION MICRODISCECTOMY Right 10/06/2021   Procedure: Right Lumbar Four-Five Microdiscectomy, Right Lumbar Five-Sacal One Foraminotomy;  Surgeon: Audie Bleacher, MD;  Location: [facility];  Service: Neurosurgery;  Laterality: Right;  3C/RM 21   THORACIC DISCECTOMY  07/16/2012   Procedure: THORACIC DISCECTOMY;  Surgeon: Pasty Bongo, MD;  Location: [facility];  Service: Neurosurgery;  Laterality: Right;  RIGHT Thoracic seven-eight  thoracic diskectomy via thoracotomy by dr Sherene Dilling   THORACIC DISCECTOMY N/A 12/15/2014   Procedure: THORACIC SEVEN TO THORACIC NINE Laminectomy ;  Surgeon: Audie Bleacher, MD;  Location: [facility];  Service: Neurosurgery;  Laterality: N/A;   THORACIC DISCECTOMY N/A 06/06/2017   Procedure: LAMINECTOMY THORACIC NINE-TEN;  Surgeon: Audie Bleacher, MD;  Location: [facility];  Service: Neurosurgery;  Laterality: N/A;  LAMINECTOMY THORACIC NINE-TEN   THORACOTOMY  07/20/2011   Procedure: THORACOTOMY OPEN FOR SPINE SURGERY;  Surgeon: Gilmore Lager, MD;  Location: [facility];  Service: Vascular;  Laterality: N/A;   THORACOTOMY  07/16/2012   Procedure: THORACOTOMY OPEN FOR SPINE SURGERY;  Surgeon: Bartley Lightning, MD;  Location: [facility];  Service: Thoracic;  Laterality: N/A;   Patient Active Problem List   Diagnosis Date Noted   Spasticity 09/25/2023   Nerve pain 12/05/2022   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   S/P lumbar spinal fusion 05/25/2022   Synovial cyst of lumbar facet joint 05/25/2022   HNP (herniated nucleus pulposus), lumbar 10/06/2021   Hand pain 06/28/2021   Lumbar facet arthropathy 01/11/2021   Thoracic spondylosis with myelopathy 01/11/2021   Thoracic spinal stenosis 06/06/2017   Stenosis, spinal, thoracic 12/15/2014   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 09/22/2014   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/2011    PCP: Cherisse Cornell  REFERRING PROVIDER: Rawland Caddy, MD  REFERRING DIAG: thoracic disc disease with myelopathy, nerve pain, adhesive arachnoiditis  Rationale for Evaluation and Treatment: Rehabilitation  THERAPY DIAG:  Other low back pain  Other symptoms and signs involving the nervous system  Radiculopathy, lumbar region  Muscle weakness (generalized)  Repeated falls  Difficulty in walking, not elsewhere classified  Other abnormalities of gait and mobility  Pain in thoracic spine  Chronic bilateral  low back pain with bilateral sciatica  ONSET DATE:  Suddenly started having weakness 11 years ago, most recent episode of worsening October 2022, s/p right L4-5 laminotomy, microdiscectomy on 10/06/2021, now s/p L4-5 PLIF by Dr. Michale Age on 05/25/2022.  PERTINENT HISTORY:  Patient is a 43 y.o. male who presents to outpatient physical therapy  with a referral for medical diagnosis thoracic disc disease with myelopathy, nerve pain, adhesive arachnoiditis. This patient's chief complaints consist of disabling low back and R >L leg pain and weakness with activity, bowel and bladder urgency, incontinence, and retention, leading to the following functional deficits: difficulty with or unable to complete any activity that requires weight bearing, use of B LE, and/or balance including working, household and community mobility, walking, driving, going to family gatherings, avoiding incontinent episodes, playing with daughter, helping around the house, bed mobility, transfers. 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 Michale Age on 05/25/2022, adhesive arachnoiditis, nerve pain.  Patient denies hx of cancer, stroke, seizures, lung problems, heart problems, diabetes, unexplained weight loss, and osteoporosis.  SUBJECTIVE:                                                                                                                                                                                           SUBJECTIVE STATEMENT:      Pt reports he is doing well. Stating he has begun to work on his ankle strength. States breathing exercises helped his pain a lot. He has been very pleased he has been able to attend all PT sessions the last 2 weeks due to better pain control.  PAIN:  NPRS: 2/10 mostly in low back, right groin/hip, bilateral posterior thighs, right calf.   PRECAUTIONS: Fall  PATIENT GOALS: "To get better, walk, drive again, climb  stairs, play with my daughter, help around the house again"  NEXT MD VISIT:   OBJECTIVE  STRENGTH (MMT):  Ankle (seated position) Dorsiflexion: R = 2/5, L = 2+/5. Plantarflexion: R = 3+/5, L = 3/5. Eversion: R = 1+/5, L = 1/5. Inversion: R = 2/5, L = 1/5.   TREATMENT Manual therapy: to reduce pain and tissue tension, improve range of motion, neuromodulation, in order to promote improved ability to complete functional activities.  Hooklying R hip caudal/posterior glide with belt, 4x45 seconds,  grade IV   Therapeutic exercise: therapeutic exercises that incorporate ONE parameter at one or more areas of the body to centralize symptoms, develop strength and endurance, range of motion, and flexibility required for successful completion of functional activities.  Seated plantar flexion against black theraband held at both ends with knee extended and foot off the floor.  3x20 each side  Seated R ankle eversion into  Black TB: 3x12. Max TC's on knee to prevent hip compensation.   Seated L ankle eversion 2x12 AROM. Max TC's for form/technique.   Seated ankle dorsiflexion 1x20 B LE R ankle moving better than L (unable to consistently clear floor)  Standing at RW with feet on airex pad. 3x30 sec, SBA   STS to RW: 3x5    Pt required multimodal cuing for proper technique and to facilitate improved neuromuscular control, strength, range of motion, and functional ability resulting in improved performance and form.   PATIENT EDUCATION:  Education details: Exercise purpose/form. Self management techniques.  Person educated: patient Education method: Explanation Education comprehension: verbalized understanding and needs further education  HOME EXERCISE PROGRAM: Verbally:  - seated lumbar flexion stretch with head down and one LE extended using rollator, 1x10 each side.  - curl up with legs elevated, 5x15 seconds - sidelying open book 1x10 each side  Access Code: [email or web address] URL:  [email or web address] Date: 12/18/2023 Prepared by: Alleen Isle  Exercises - Seated Diaphragmatic Breathing  - 3 reps - 1 minute practice time - Seated Pelvic Floor Elevators  - 3 x daily - 2 sets - 10 reps - Seated Ankle Plantar Flexion with Resistance Loop  - 1 x daily - 3 sets - 20 reps - Seated Ankle Dorsiflexion AROM  - 1 x daily - 3 sets - 10-30 reps - Seated Ankle Inversion AROM  - 1 x daily - 3 sets - 10-30 reps - Seated Ankle Eversion AROM  - 1 x daily - 3 sets - 10-30 reps - Sidelying Ankle Eversion Strengthening with Ankle Weight  - 1 x daily - 3 sets - 20 reps - Sidelying Ankle Inversion with Ankle Weight  - 1 x daily - 3 sets - 20 reps - Seated Ankle Dorsiflexion with Ankle Weight  - 1 x daily - 3 sets - 20 reps  ASSESSMENT:  CLINICAL IMPRESSION:     Continuing PT POC working on ankle strength and mobility. Pt remains markedly weak in B ankles but especially in L ankle eversion. Tolerating static standing on airex for ankle stability to RW with VC's to use RW as little as possible, safely with good tolerance. Mild onset of LBP reported after airex pad standing. Pt does need regular multimodal cuing to ensure correct form/technique with ankle exercises but with good carryover. Patient would benefit from continued management of limiting condition by skilled physical therapist to address remaining impairments and functional limitations to work towards stated goals and return to PLOF or maximal functional independence.   OBJECTIVE IMPAIRMENTS: Abnormal gait, decreased activity tolerance, decreased balance, decreased coordination, decreased endurance, decreased knowledge of condition, decreased knowledge of use of DME, decreased mobility, difficulty walking, decreased ROM, decreased strength, increased edema, impaired perceived functional ability, increased muscle spasms, impaired flexibility, impaired sensation, impaired tone, improper body mechanics, postural dysfunction,  obesity, and pain.   ACTIVITY LIMITATIONS: carrying, lifting, bending, standing, squatting, stairs, transfers, bed mobility, continence, bathing, toileting, dressing, hygiene/grooming, locomotion level, and caring for others  PARTICIPATION LIMITATIONS: meal prep, cleaning, laundry, interpersonal relationship, driving, shopping, community activity, occupation, yard work,  and  difficulty with or unable to complete any activity that requires weight bearing, use of B LE, and/or balance including working, household and community mobility, walking, driving, going to family gatherings, avoiding incontinent episodes, playing with daughter, helping around the house, bed mobility, transfers  PERSONAL FACTORS: Past/current experiences, Time since onset of injury/illness/exacerbation, and 3+ comorbidities:  3 thoracic spine surgeries, thoracic MRI notes "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 Michale Age on 05/25/2022, adhesive arachnoiditis, nerve pain are also affecting patient's functional outcome.   REHAB POTENTIAL: Fair due to severity and nature of condition.   CLINICAL DECISION MAKING: Evolving/moderate complexity  EVALUATION COMPLEXITY: Moderate   GOALS: Goals reviewed with patient? No  SHORT TERM GOALS: Target date: 01/09/2023. Target date updated to 05/28/2023 for all unmet goals on 03/05/2023.   Patient will be independent with initial home exercise program for self-management of symptoms. Baseline: Initial HEP to be provided at visit 2 as appropriate (12/26/22); Goal status: MET   LONG TERM GOALS: Target date: 03/20/2023. Target date updated to 08/26/23  for all unmet goals on  06/03/2023. Target date updated to 11/20/2023 for all  unmet goals on 08/28/2023. Target date updated to 02/17/2024  for all unmet goals on 11/25/2023.  Patient will be independent with a long-term home exercise program for self-management of symptoms.  Baseline: Initial HEP to be provided at visit 2 as appropriate (12/26/22); participating as able (01/31/2023); patient currently participating as tolerated (03/05/2023; 04/09/2023; 06/03/2023); participates daily in mat exercises and in weight bearing exercises as tolerated (10/08/2023; 11/25/2023);  Goal status: In-progress  2.  Patient will demonstrate improved FOTO by equal or greater than 10 points by visit #13 to demonstrate improvement in overall condition and self-reported functional ability.  Baseline: to be tested visit 2 as appropriate (12/26/22); 30 at visit #3 (01/02/2023); 46 at visit #13 (03/05/2023); 45 at visit #29 (06/03/2023); 49 at visit #50 (10/08/2023);  Goal status: MET  3.  Patient will demonstrate the ability to ambulate equal or greater than 600 feet with LRAD during the 6 minute walk to improve his household and community mobility.  Baseline: 34 feet with bari-RW (12/26/22); 200 feet with bari-RW (01/31/2023); 500 feet with BRW (03/05/2023); 400 feet with BRW (04/09/2023); 152 feet with BRW and W/C follow for safety (06/03/2023); 330 feet with BRW (10/08/2023); 245 feet with BRW and SBA for safety (11/25/2023);  Goal status: Ongoing  4.  Patient will complete 5 Time Sit To Stand Test from 19.5 inch surface or lower in equal or less than 15 seconds with no UE support to demonstrate improved transfer ability for improved household and toileting mobility.  Baseline: 23 seconds with heavy B UE support on RW from 19.5 inch plinth. Pain throbbing down the right LE.  (12/26/22); 19 seconds with heavy B UE support on RW from 19.5 inch plinth (03/05/2023); 20 seconds with heavy B UE support on RRW from 18.5 inch plinth. Painful in back of B calves,  thighs, and lower back (04/09/2023); 15 seconds with heavy B UE support on BRW from 18.5 inch plinth. Painful in back of B calves, thighs, and lower back (06/03/2023); 19 seconds with heavy B UE support on BRW from 18.5 inch plinth. Painful in back of  B calves, thighs, and lower back (10/08/2023); 18 seconds with heavy B UE support on BRW from 18.5 inch plinth. Painful in back of B calves, thighs, and lower back (11/25/2023);  Goal status: Progressing   5.  Patient will report worst pain equal or less than 3/10 with functional activities to improve his ability to complete basic household and community mobility. Baseline: up to 8/10 (12/26/22); reports 3/10 pain (01/31/2023); reports 4/10 pain (02/19/2023); up to 7/10 in the last 2 week s(03/05/2023): up to  7/10 in the last two weeks (04/09/2023); up to 8/10 in the last 2 weeks (06/03/2023);  up to 9/10 over the last 2 weeks (10/08/2023); up to 7-8/10 in the last 2 weeks (11/25/2023); Goal status: Ongoing  6.  Patient will demonstrate improvement in Patient Specific Functional Scale (PSFS) by equal or greater than 3/10 points to reflect clinically significant improvement in patient's most valued functional activities. Baseline: 1.7/10 for Walking Around [address], Enjoying Life, and Driving (24/40/10); 2.7/25 (11/25/2023);  Goal status: progressing    PLAN:  PT FREQUENCY: 1-2x/week  PT DURATION: 12 weeks  PLANNED INTERVENTIONS: Therapeutic exercises, Therapeutic activity, Neuromuscular re-education, Balance training, Gait training, Patient/Family education, Self Care, Joint mobilization, Stair training, Orthotic/Fit training, DME instructions, Aquatic Therapy, Dry Needling, Electrical stimulation, Wheelchair mobility training, Spinal mobilization, Cryotherapy, Moist heat, Manual therapy, and Re-evaluation.  PLAN FOR NEXT SESSION:  update HEP as appropriate, neurodynamics, gait training, LE/core/functional strengthening, stretching, and balance as tolerated,  education, manual therapy as needed. Pelvic floor.   Marc Senior. Fairly IV, PT, DPT Physical Therapist- [place]  [facility]  12/26/23, 5:49 PM

## 2023-12-31 ENCOUNTER — Encounter: Payer: Self-pay | Admitting: Physical Therapy

## 2023-12-31 ENCOUNTER — Ambulatory Visit: Admitting: Physical Therapy

## 2023-12-31 DIAGNOSIS — M5459 Other low back pain: Secondary | ICD-10-CM

## 2023-12-31 DIAGNOSIS — M6281 Muscle weakness (generalized): Secondary | ICD-10-CM

## 2023-12-31 DIAGNOSIS — M5416 Radiculopathy, lumbar region: Secondary | ICD-10-CM

## 2023-12-31 DIAGNOSIS — R296 Repeated falls: Secondary | ICD-10-CM

## 2023-12-31 DIAGNOSIS — R29818 Other symptoms and signs involving the nervous system: Secondary | ICD-10-CM

## 2023-12-31 DIAGNOSIS — R262 Difficulty in walking, not elsewhere classified: Secondary | ICD-10-CM

## 2023-12-31 NOTE — Therapy (Signed)
 OUTPATIENT PHYSICAL THERAPY TREATMENT   Patient Name: GRANTHAM HIPPERT MRN: [id number] DOB:[birth year], 43 y.o., male Today's Date: 12/31/23   END OF SESSION:  PT End of Session - 12/31/23 1604     Visit Number 65    Number of Visits 70    Date for PT Re-Evaluation 02/17/24    Authorization Type MEDICARE PART B reporting period from 12/04/23    Progress Note Due on Visit 60    PT Start Time 1606    PT Stop Time 1650    PT Time Calculation (min) 44 min    Activity Tolerance Patient limited by pain;Patient tolerated treatment well    Behavior During Therapy [facility] for tasks assessed/performed                 Past Medical History:  Diagnosis Date   Bowel trouble    urgency   Medical history non-contributory    Urinary urgency    Past Surgical History:  Procedure Laterality Date   BACK SURGERY  2010   CIRCUMCISION     LUMBAR LAMINECTOMY/DECOMPRESSION MICRODISCECTOMY  07/20/2011   Procedure: LUMBAR LAMINECTOMY/DECOMPRESSION MICRODISCECTOMY;  Surgeon: Pasty Bongo;  Location: [facility];  Service: Neurosurgery;  Laterality: N/A;  right thoracotomy with thoracic eight-nine discectomy and fusion   LUMBAR LAMINECTOMY/DECOMPRESSION MICRODISCECTOMY Right 10/06/2021   Procedure: Right Lumbar Four-Five Microdiscectomy, Right Lumbar Five-Sacal One Foraminotomy;  Surgeon: Audie Bleacher, MD;  Location: [facility];  Service: Neurosurgery;  Laterality: Right;  3C/RM 21   THORACIC DISCECTOMY  07/16/2012   Procedure: THORACIC DISCECTOMY;  Surgeon: Pasty Bongo, MD;  Location: [facility];  Service: Neurosurgery;  Laterality: Right;  RIGHT Thoracic seven-eight  thoracic diskectomy via thoracotomy by dr Sherene Dilling   THORACIC DISCECTOMY N/A 12/15/2014   Procedure: THORACIC SEVEN TO THORACIC NINE Laminectomy ;  Surgeon: Audie Bleacher, MD;  Location: [facility];  Service: Neurosurgery;  Laterality: N/A;   THORACIC DISCECTOMY N/A 06/06/2017   Procedure: LAMINECTOMY THORACIC NINE-TEN;  Surgeon: Audie Bleacher, MD;  Location: [facility];  Service: Neurosurgery;  Laterality: N/A;  LAMINECTOMY THORACIC NINE-TEN   THORACOTOMY  07/20/2011   Procedure: THORACOTOMY OPEN FOR SPINE SURGERY;  Surgeon: Gilmore Lager, MD;  Location: [facility];  Service: Vascular;  Laterality: N/A;   THORACOTOMY  07/16/2012   Procedure: THORACOTOMY OPEN FOR SPINE SURGERY;  Surgeon: Bartley Lightning, MD;  Location: [facility];  Service: Thoracic;  Laterality: N/A;   Patient Active Problem List   Diagnosis Date Noted   Spasticity 09/25/2023   Nerve pain 12/05/2022   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   S/P lumbar spinal fusion 05/25/2022   Synovial cyst of lumbar facet joint 05/25/2022   HNP (herniated nucleus pulposus), lumbar 10/06/2021   Hand pain 06/28/2021   Lumbar facet arthropathy 01/11/2021   Thoracic spondylosis with myelopathy 01/11/2021   Thoracic spinal stenosis 06/06/2017   Stenosis, spinal, thoracic 12/15/2014   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 09/22/2014   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/2011    PCP: Cherisse Cornell  REFERRING PROVIDER: Rawland Caddy, MD  REFERRING DIAG: thoracic disc disease with myelopathy, nerve pain, adhesive arachnoiditis  Rationale for Evaluation and Treatment: Rehabilitation  THERAPY DIAG:  Other low back pain  Other symptoms and signs involving the nervous system  Radiculopathy, lumbar region  Muscle weakness (generalized)  Repeated falls  Difficulty in walking, not elsewhere classified  ONSET DATE:  Suddenly started having weakness 11 years ago, most recent episode  of worsening October 2022, s/p right L4-5 laminotomy, microdiscectomy on 10/06/2021, now s/p L4-5 PLIF by Dr. Michale Age on 05/25/2022.  PERTINENT HISTORY:  Patient is a 43 y.o. male who presents to outpatient physical therapy with a referral for medical diagnosis thoracic disc disease with myelopathy, nerve pain, adhesive arachnoiditis. This patient's  chief complaints consist of disabling low back and R >L leg pain and weakness with activity, bowel and bladder urgency, incontinence, and retention, leading to the following functional deficits: difficulty with or unable to complete any activity that requires weight bearing, use of B LE, and/or balance including working, household and community mobility, walking, driving, going to family gatherings, avoiding incontinent episodes, playing with daughter, helping around the house, bed mobility, transfers. 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 Michale Age on 05/25/2022, adhesive arachnoiditis, nerve pain.  Patient denies hx of cancer, stroke, seizures, lung problems, heart problems, diabetes, unexplained weight loss, and osteoporosis.  SUBJECTIVE:                                                                                                                                                                                           SUBJECTIVE STATEMENT:     Patient states his right leg is acting up today. He noticed it this morning when he got up. He states the breathing exercises work because this is the 5 PT session in a row he was able to attend without having so much pain he had to cancel. He has been working on his foot/ankle exercises and he feels they are helping. He can take a few steps without holding now. His right thumb stays numb most of the time and he states Dr. Rachel Budds said he did not think it is related to his AA. Patient is not sure what to think because he has online friends with AA  whose doctor said his similar symptoms are related to his AA.   PAIN:  NPRS: 5/10 mostly in low back, right groin/hip, bilateral posterior thighs, right calf.   PRECAUTIONS: Fall  PATIENT GOALS: "To get better, walk, drive again, climb stairs, play with my daughter, help around the house again"  NEXT MD VISIT:   OBJECTIVE  STRENGTH (MMT) (last tested 12/18/23):  Ankle (seated position) Dorsiflexion: R = 2/5, L = 2+/5.  Plantarflexion: R = 3+/5, L = 3/5. Eversion: R = 1+/5, L = 1/5. Inversion: R = 2/5, L = 1/5.  CERVICAL SPINE SPECIAL TESTS Spurling's part B: R = positive Cervical spine axial distraction: positive    TREATMENT Manual therapy: to reduce pain and tissue tension, improve range of motion, neuromodulation, in order to promote improved ability to complete functional activities.  Hooklying R hip caudal/posterior glide with belt, 4x45 seconds, grade IV  Cervical spine special tests (see above)   Therapeutic exercise: therapeutic exercises that incorporate ONE parameter at one or more areas of the body to centralize symptoms, develop strength and endurance, range of motion, and flexibility required for successful completion of functional activities.  Seated plantar flexion against black (progressed to Silver) theraband held at both ends with knee extended and foot off the floor.  3x20 each side  Neuromuscular Re-education: a technique or exercise performed with the goal of improving the level of communication between the body and the brain, such as for balance, motor control, muscle activation patterns, coordination, desensitization, quality of muscle contraction, proprioception, and/or kinesthetic sense needed for successful and safe completion of functional activities.    Seated R ankle eversion into YTB with loop at end: 3x10. Patient with self block of R thigh to help prevent hip compensation.   Seated L ankle eversion 2x12 against YTB with ankle in PF while  propped on PT's leg. Unable to move past neutral into eversion but able to move from inversion towards neutral. Band with very light resistance and for visual stimulus. More reps in different positions to try to get activation, but settled on above before successful reps counted.   Seated ankle dorsiflexion 1x20 R LE, 1x15 L LE (unable to continue after 15 due to fatigue) R ankle moving better than L (unable to consistently clear floor)  Standing at RW with feet on airex pad. 3x30 sec, SBA   TRX squat/STS to airex pad on 17 inch chair:: 2x3-4 with various strategies for abdominal brace, but ultimately did not continue due to R hip/glute pain/instability when patient allows abdomen to expand   Pt required multimodal cuing for proper technique and to facilitate improved neuromuscular control, strength, range of motion, and functional ability resulting in improved performance and form.   PATIENT EDUCATION:  Education details: Exercise purpose/form. Self management techniques.  Person educated: patient Education method: Explanation Education comprehension: verbalized understanding and needs further education  HOME EXERCISE PROGRAM: Verbally:  - seated lumbar flexion stretch with head down and one LE extended using rollator, 1x10 each side.  - curl up with legs elevated, 5x15 seconds - sidelying open book 1x10 each side  Access Code: [email or web address] URL: [email or web address] Date: 12/18/2023 Prepared by: Alleen Isle  Exercises - Seated Diaphragmatic Breathing  - 3 reps - 1 minute practice time - Seated Pelvic Floor Elevators  - 3 x daily - 2 sets - 10 reps - Seated Ankle Plantar Flexion with Resistance Loop  - 1 x daily - 3 sets - 20 reps - Seated Ankle Dorsiflexion AROM  - 1 x daily - 3 sets - 10-30 reps - Seated Ankle Inversion AROM  - 1 x daily - 3 sets - 10-30 reps - Seated Ankle Eversion AROM  - 1 x daily - 3 sets - 10-30 reps - Sidelying Ankle Eversion Strengthening with  Ankle Weight  - 1 x daily - 3 sets - 20 reps - Sidelying Ankle Inversion with Ankle Weight  - 1 x daily - 3 sets -  20 reps - Seated Ankle Dorsiflexion with Ankle Weight  - 1 x daily - 3 sets - 20 reps  ASSESSMENT:  CLINICAL IMPRESSION:     Continued POC working on ankle strength. Patient continues to have extreme difficulty with ankle eversion (L > R) but was able to progress plantar flexion. He had more hip/gute pain today and was unable to tolerate sit <> stand as well as usual. R thumb numbness appears likely to be radicular in nature coming from his neck. Patient would benefit from continued management of limiting condition by skilled physical therapist to address remaining impairments and functional limitations to work towards stated goals and return to PLOF or maximal functional independence.    OBJECTIVE IMPAIRMENTS: Abnormal gait, decreased activity tolerance, decreased balance, decreased coordination, decreased endurance, decreased knowledge of condition, decreased knowledge of use of DME, decreased mobility, difficulty walking, decreased ROM, decreased strength, increased edema, impaired perceived functional ability, increased muscle spasms, impaired flexibility, impaired sensation, impaired tone, improper body mechanics, postural dysfunction, obesity, and pain.   ACTIVITY LIMITATIONS: carrying, lifting, bending, standing, squatting, stairs, transfers, bed mobility, continence, bathing, toileting, dressing, hygiene/grooming, locomotion level, and caring for others  PARTICIPATION LIMITATIONS: meal prep, cleaning, laundry, interpersonal relationship, driving, shopping, community activity, occupation, yard work, and  difficulty with or unable to complete any activity that requires weight bearing, use of B LE, and/or balance including working, household and community mobility, walking, driving, going to family gatherings, avoiding incontinent episodes, playing with daughter, helping around the  house, bed mobility, transfers  PERSONAL FACTORS: Past/current experiences, Time since onset of injury/illness/exacerbation, and 3+ comorbidities:  3 thoracic spine surgeries, thoracic MRI notes "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 Michale Age on 05/25/2022, adhesive arachnoiditis, nerve pain are also affecting patient's functional outcome.   REHAB POTENTIAL: Fair due to severity and nature of condition.   CLINICAL DECISION MAKING: Evolving/moderate complexity  EVALUATION COMPLEXITY: Moderate   GOALS: Goals reviewed with patient? No  SHORT TERM GOALS: Target date: 01/09/2023. Target date updated to 05/28/2023 for all unmet goals on 03/05/2023.   Patient will be independent with initial home exercise program for self-management of symptoms. Baseline: Initial HEP to be provided at visit 2 as appropriate (12/26/22); Goal status: MET   LONG TERM GOALS: Target date: 03/20/2023. Target date updated to 08/26/23  for all unmet goals on 06/03/2023. Target date updated to 11/20/2023 for all  unmet goals on 08/28/2023. Target date updated to 02/17/2024  for all unmet goals on 11/25/2023.  Patient will be independent with a long-term home exercise program for self-management of symptoms.  Baseline: Initial HEP to be provided at visit 2 as appropriate (12/26/22); participating as able (01/31/2023); patient currently participating as tolerated (03/05/2023; 04/09/2023; 06/03/2023); participates daily in mat exercises and in weight bearing exercises as tolerated (10/08/2023; 11/25/2023);  Goal status: In-progress  2.  Patient will demonstrate improved FOTO by equal or greater than 10 points by visit  #13 to demonstrate improvement in overall condition and self-reported functional ability.  Baseline: to be tested visit 2 as appropriate (12/26/22); 30 at visit #3 (01/02/2023); 46 at visit #13 (03/05/2023); 45 at visit #29 (06/03/2023); 49 at visit #50 (10/08/2023);  Goal status: MET  3.  Patient will demonstrate  the ability to ambulate equal or greater than 600 feet with LRAD during the 6 minute walk to improve his household and community mobility.  Baseline: 34 feet with bari-RW (12/26/22); 200 feet with bari-RW (01/31/2023); 500 feet with BRW (03/05/2023); 400 feet with BRW (04/09/2023); 152 feet with BRW and W/C follow for safety (06/03/2023); 330 feet with BRW (10/08/2023); 245 feet with BRW and SBA for safety (11/25/2023);  Goal status: Ongoing  4.  Patient will complete 5 Time Sit To Stand Test from 19.5 inch surface or lower in equal or less than 15 seconds with no UE support to demonstrate improved transfer ability for improved household and toileting mobility.  Baseline: 23 seconds with heavy B UE support on RW from 19.5 inch plinth. Pain throbbing down the right LE.  (12/26/22); 19 seconds with heavy B UE support on RW from 19.5 inch plinth (03/05/2023); 20 seconds with heavy B UE support on RRW from 18.5 inch plinth. Painful in back of B calves, thighs, and lower back (04/09/2023); 15 seconds with heavy B UE support on BRW from 18.5 inch plinth. Painful in back of B calves, thighs, and lower back (06/03/2023); 19 seconds with heavy B UE support on BRW from 18.5 inch plinth. Painful in back of B calves, thighs, and lower back (10/08/2023); 18 seconds with heavy B UE support on BRW from 18.5 inch plinth. Painful in back of B calves, thighs, and lower back (11/25/2023);  Goal status: Progressing   5.  Patient will report worst pain equal or less than 3/10 with functional activities to improve his ability to complete basic household and community mobility. Baseline: up to 8/10 (12/26/22); reports 3/10 pain  (01/31/2023); reports 4/10 pain (02/19/2023); up to 7/10 in the last 2 week s(03/05/2023): up to  7/10 in the last two weeks (04/09/2023); up to 8/10 in the last 2 weeks (06/03/2023);  up to 9/10 over the last 2 weeks (10/08/2023); up to 7-8/10 in the last 2 weeks (11/25/2023); Goal status: Ongoing  6.  Patient will demonstrate improvement in Patient Specific Functional Scale (PSFS) by equal or greater than 3/10 points to reflect clinically significant improvement in patient's most valued functional activities. Baseline: 1.7/10 for Walking Around [address], Enjoying Life, and Driving (69/62/95); 2.8/41 (11/25/2023);  Goal status: progressing    PLAN:  PT FREQUENCY: 1-2x/week  PT DURATION: 12 weeks  PLANNED INTERVENTIONS: Therapeutic exercises, Therapeutic activity, Neuromuscular re-education, Balance training, Gait training, Patient/Family education, Self Care, Joint mobilization, Stair training, Orthotic/Fit training, DME instructions, Aquatic Therapy, Dry Needling, Electrical stimulation, Wheelchair mobility training, Spinal mobilization, Cryotherapy, Moist heat, Manual therapy, and Re-evaluation.  PLAN FOR NEXT SESSION:  update HEP as appropriate, neurodynamics, gait training, LE/core/functional strengthening, stretching, and balance as tolerated, education, manual therapy as needed. Pelvic floor.   Carilyn Charles. Artemio Larry, PT, DPT 12/31/23, 5:06 PM  [facility] [address] [place], [place] [address] P: [phone] I F: [phone]

## 2023-12-31 NOTE — Telephone Encounter (Signed)
 Task completed in Epic.

## 2024-01-02 ENCOUNTER — Ambulatory Visit: Admitting: Physical Therapy

## 2024-01-07 ENCOUNTER — Other Ambulatory Visit: Payer: Self-pay | Admitting: Physical Medicine & Rehabilitation

## 2024-01-07 DIAGNOSIS — G039 Meningitis, unspecified: Secondary | ICD-10-CM

## 2024-01-07 DIAGNOSIS — M792 Neuralgia and neuritis, unspecified: Secondary | ICD-10-CM

## 2024-01-07 DIAGNOSIS — M5104 Intervertebral disc disorders with myelopathy, thoracic region: Secondary | ICD-10-CM

## 2024-01-08 ENCOUNTER — Encounter: Payer: Self-pay | Admitting: Physical Therapy

## 2024-01-08 ENCOUNTER — Ambulatory Visit: Admitting: Physical Therapy

## 2024-01-08 DIAGNOSIS — M5459 Other low back pain: Secondary | ICD-10-CM | POA: Diagnosis not present

## 2024-01-08 DIAGNOSIS — R296 Repeated falls: Secondary | ICD-10-CM

## 2024-01-08 DIAGNOSIS — R262 Difficulty in walking, not elsewhere classified: Secondary | ICD-10-CM

## 2024-01-08 DIAGNOSIS — M6281 Muscle weakness (generalized): Secondary | ICD-10-CM

## 2024-01-08 DIAGNOSIS — R29818 Other symptoms and signs involving the nervous system: Secondary | ICD-10-CM

## 2024-01-08 DIAGNOSIS — M5416 Radiculopathy, lumbar region: Secondary | ICD-10-CM

## 2024-01-08 NOTE — Therapy (Signed)
 OUTPATIENT PHYSICAL THERAPY TREATMENT   Patient Name: Travis Fitzpatrick MRN: [id number] DOB:04/30/1981, 43 y.o., male Today's Date: 01/08/24   END OF SESSION:  PT End of Session - 01/08/24 1706     Visit Number 66    Number of Visits 70    Date for PT Re-Evaluation 02/17/24    Authorization Type MEDICARE PART B reporting period from 12/04/23    Progress Note Due on Visit 60    PT Start Time 1652    PT Stop Time 1730    PT Time Calculation (min) 38 min    Activity Tolerance Patient limited by pain    Behavior During Therapy [facility] for tasks assessed/performed                  Past Medical History:  Diagnosis Date   Bowel trouble    urgency   Medical history non-contributory    Urinary urgency    Past Surgical History:  Procedure Laterality Date   BACK SURGERY  2010   CIRCUMCISION     LUMBAR LAMINECTOMY/DECOMPRESSION MICRODISCECTOMY  07/20/2011   Procedure: LUMBAR LAMINECTOMY/DECOMPRESSION MICRODISCECTOMY;  Surgeon: Pasty Bongo;  Location: [facility];  Service: Neurosurgery;  Laterality: N/A;  right thoracotomy with thoracic eight-nine discectomy and fusion   LUMBAR LAMINECTOMY/DECOMPRESSION MICRODISCECTOMY Right 10/06/2021   Procedure: Right Lumbar Four-Five Microdiscectomy, Right Lumbar Five-Sacal One Foraminotomy;  Surgeon: Audie Bleacher, MD;  Location: [facility];  Service: Neurosurgery;  Laterality: Right;  3C/RM 21   THORACIC DISCECTOMY  07/16/2012   Procedure: THORACIC DISCECTOMY;  Surgeon: Pasty Bongo, MD;  Location: [facility];  Service: Neurosurgery;  Laterality: Right;  RIGHT Thoracic seven-eight  thoracic diskectomy via thoracotomy by dr Sherene Dilling   THORACIC DISCECTOMY N/A 12/15/2014   Procedure: THORACIC SEVEN TO THORACIC NINE Laminectomy ;  Surgeon: Audie Bleacher, MD;  Location: [facility];  Service: Neurosurgery;  Laterality: N/A;   THORACIC DISCECTOMY N/A 06/06/2017   Procedure: LAMINECTOMY THORACIC NINE-TEN;  Surgeon: Audie Bleacher, MD;  Location: [facility];   Service: Neurosurgery;  Laterality: N/A;  LAMINECTOMY THORACIC NINE-TEN   THORACOTOMY  07/20/2011   Procedure: THORACOTOMY OPEN FOR SPINE SURGERY;  Surgeon: Gilmore Lager, MD;  Location: [facility];  Service: Vascular;  Laterality: N/A;   THORACOTOMY  07/16/2012   Procedure: THORACOTOMY OPEN FOR SPINE SURGERY;  Surgeon: Bartley Lightning, MD;  Location: [facility];  Service: Thoracic;  Laterality: N/A;   Patient Active Problem List   Diagnosis Date Noted   Spasticity 09/25/2023   Nerve pain 12/05/2022   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   S/P lumbar spinal fusion 05/25/2022   Synovial cyst of lumbar facet joint 05/25/2022   HNP (herniated nucleus pulposus), lumbar 10/06/2021   Hand pain 06/28/2021   Lumbar facet arthropathy 01/11/2021   Thoracic spondylosis with myelopathy 01/11/2021   Thoracic spinal stenosis 06/06/2017   Stenosis, spinal, thoracic 12/15/2014   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 09/22/2014   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/2011    PCP: Cherisse Cornell  REFERRING PROVIDER: Rawland Caddy, MD  REFERRING DIAG: thoracic disc disease with myelopathy, nerve pain, adhesive arachnoiditis  Rationale for Evaluation and Treatment: Rehabilitation  THERAPY DIAG:  Other low back pain  Other symptoms and signs involving the nervous system  Radiculopathy, lumbar region  Muscle weakness (generalized)  Repeated falls  Difficulty in walking, not elsewhere classified  ONSET DATE:  Suddenly started having weakness 11 years ago, most recent episode of worsening  October 2022, s/p right L4-5 laminotomy, microdiscectomy on 10/06/2021, now s/p L4-5 PLIF by Dr. Michale Age on 05/25/2022.  PERTINENT HISTORY:  Patient is a 43 y.o. male who presents to outpatient physical therapy with a referral for medical diagnosis thoracic disc disease with myelopathy, nerve pain, adhesive arachnoiditis. This patient's chief complaints consist of  disabling low back and R >L leg pain and weakness with activity, bowel and bladder urgency, incontinence, and retention, leading to the following functional deficits: difficulty with or unable to complete any activity that requires weight bearing, use of B LE, and/or balance including working, household and community mobility, walking, driving, going to family gatherings, avoiding incontinent episodes, playing with daughter, helping around the house, bed mobility, transfers. 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 Michale Age on 05/25/2022, adhesive arachnoiditis, nerve pain.  Patient denies hx of cancer, stroke, seizures, lung problems, heart problems, diabetes, unexplained weight loss, and osteoporosis.  SUBJECTIVE:                                                                                                                                                                                           SUBJECTIVE STATEMENT:     Patient states he is doing okay. He states his leg and back feels similar to last week. He states he could not walk on his right LE and fell on Thursday. It feels like a "stinger" in the right sacrum to his lower glute region. This happens when he does a lot, mostly when he walks and stands on it. It stays for about 2 min to 2 days once it happens. The "stinger" is there right now a little bit. HEP has been going pretty good. He states he can almost lift his heels up when standing, putting some pressure  on his walker.   PAIN:  NPRS: 3/10 mostly in low back, right  groin/hip, bilateral posterior thighs, right calf.   PRECAUTIONS: Fall  PATIENT GOALS: "To get better, walk, drive again, climb stairs, play with my daughter, help around the house again"  NEXT MD VISIT:   OBJECTIVE  STRENGTH (MMT) (last tested 12/18/23):  Ankle (seated position) Dorsiflexion: R = 2/5, L = 2+/5. Plantarflexion: R = 3+/5, L = 3/5. Eversion: R = 1+/5, L = 1/5. Inversion: R = 2/5,  L = 1/5.   TREATMENT Manual therapy: to reduce pain and tissue tension, improve range of motion, neuromodulation, in order to promote improved ability to complete functional activities.  Hooklying R hip caudal/posterior glide with belt, 4x45 seconds, grade IV  Therapeutic exercise: therapeutic exercises that incorporate ONE parameter at one or more areas of the body to centralize symptoms, develop strength and endurance, range of motion, and flexibility required for successful completion of functional activities.  Superset:  Seated overhead pull down (B shoulder extension from ~120 degrees flexion to bar on thighs) with cable bar to improve core strength 2x20 at 25# Increasing pain at right LE, but pt feels it is acceptable   Seated CHEST press to improve trunk extensor strength 2x15 with 23# Barbell  Seated LAQ on elevated plinth with AW 2x20 each side with 10#AW Very fatiguing by end of set Pulling down back of R LE but R LE feels "stronger" to pt  Neuromuscular Re-education: a technique or exercise performed with the goal of improving the level of communication between the body and the brain, such as for balance, motor control, muscle activation patterns, coordination, desensitization, quality of muscle contraction, proprioception, and/or kinesthetic sense needed for successful and safe completion of functional activities.   Seated LE extension/flexion with foot on basketball  3x to muscular failure each side (5-10 reps) Needed min A at R ankle to prevent eversion off the  ball Needed assistance to lift foot onto ball bilaterally.    Pt required multimodal cuing for proper technique and to facilitate improved neuromuscular control, strength, range of motion, and functional ability resulting in improved performance and form.   PATIENT EDUCATION:  Education details: Exercise purpose/form. Self management techniques.  Person educated: patient Education method: Explanation Education comprehension: verbalized understanding and needs further education  HOME EXERCISE PROGRAM: Verbally:  - seated lumbar flexion stretch with head down and one LE extended using rollator, 1x10 each side.  - curl up with legs elevated, 5x15 seconds - sidelying open book 1x10 each side  Access Code: [email or web address] URL: [email or web address] Date: 12/18/2023 Prepared by: Alleen Isle  Exercises - Seated Diaphragmatic Breathing  - 3 reps - 1 minute practice time - Seated Pelvic Floor Elevators  - 3 x daily - 2 sets - 10 reps - Seated Ankle Plantar Flexion with Resistance Loop  - 1 x daily - 3 sets - 20 reps - Seated Ankle Dorsiflexion AROM  - 1 x daily - 3 sets - 10-30 reps - Seated Ankle Inversion AROM  - 1 x daily - 3 sets - 10-30 reps - Seated Ankle Eversion AROM  - 1 x daily - 3 sets - 10-30 reps - Sidelying Ankle Eversion Strengthening with Ankle Weight  - 1 x daily - 3 sets - 20 reps - Sidelying Ankle Inversion with Ankle Weight  - 1 x daily - 3 sets - 20 reps - Seated Ankle Dorsiflexion with Ankle Weight  - 1 x daily - 3 sets - 20 reps  ASSESSMENT:  CLINICAL IMPRESSION:     Pain increased to 4/10 by end of session, especially provoked with LAQ. Continued POC working on core, functional, and LE strength. Patient continues to be severely limited by neurogenic weakness and pain. Patient would benefit from continued management of limiting condition by skilled physical therapist to address remaining impairments and functional limitations to work towards stated goals and  return to PLOF or maximal functional independence.   OBJECTIVE IMPAIRMENTS: Abnormal gait, decreased activity tolerance, decreased balance, decreased coordination,  decreased endurance, decreased knowledge of condition, decreased knowledge of use of DME, decreased mobility, difficulty walking, decreased ROM, decreased strength, increased edema, impaired perceived functional ability, increased muscle spasms, impaired flexibility, impaired sensation, impaired tone, improper body mechanics, postural dysfunction, obesity, and pain.   ACTIVITY LIMITATIONS: carrying, lifting, bending, standing, squatting, stairs, transfers, bed mobility, continence, bathing, toileting, dressing, hygiene/grooming, locomotion level, and caring for others  PARTICIPATION LIMITATIONS: meal prep, cleaning, laundry, interpersonal relationship, driving, shopping, community activity, occupation, yard work, and  difficulty with or unable to complete any activity that requires weight bearing, use of B LE, and/or balance including working, household and community mobility, walking, driving, going to family gatherings, avoiding incontinent episodes, playing with daughter, helping around the house, bed mobility, transfers  PERSONAL FACTORS: Past/current experiences, Time since onset of injury/illness/exacerbation, and 3+ comorbidities:  3 thoracic spine surgeries, thoracic MRI notes "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 Michale Age on 05/25/2022, adhesive arachnoiditis, nerve pain are also affecting patient's functional outcome.   REHAB POTENTIAL: Fair due to severity and nature of condition.   CLINICAL DECISION MAKING:  Evolving/moderate complexity  EVALUATION COMPLEXITY: Moderate   GOALS: Goals reviewed with patient? No  SHORT TERM GOALS: Target date: 01/09/2023. Target date updated to 05/28/2023 for all unmet goals on 03/05/2023.   Patient will be independent with initial home exercise program for self-management of symptoms. Baseline: Initial HEP to be provided at visit 2 as appropriate (12/26/22); Goal status: MET   LONG TERM GOALS: Target date: 03/20/2023. Target date updated to 08/26/23  for all unmet goals on 06/03/2023. Target date updated to 11/20/2023 for all  unmet goals on 08/28/2023. Target date updated to 02/17/2024  for all unmet goals on 11/25/2023.  Patient will be independent with a long-term home exercise program for self-management of symptoms.  Baseline: Initial HEP to be provided at visit 2 as appropriate (12/26/22); participating as able (01/31/2023); patient currently participating as tolerated (03/05/2023; 04/09/2023; 06/03/2023); participates daily in mat exercises and in weight bearing exercises as tolerated (10/08/2023; 11/25/2023);  Goal status: In-progress  2.  Patient will demonstrate improved FOTO by equal or greater than 10 points by visit #13 to demonstrate improvement in overall condition and self-reported functional ability.  Baseline: to be tested visit 2 as appropriate (12/26/22); 30 at visit #3 (01/02/2023); 46 at visit #13 (03/05/2023); 45 at visit #29 (06/03/2023); 49 at visit #50 (10/08/2023);  Goal status: MET  3.  Patient will demonstrate the ability to ambulate equal or greater than 600 feet with LRAD during the 6 minute walk to improve his household and community mobility.  Baseline: 34 feet with bari-RW (12/26/22); 200 feet with bari-RW (01/31/2023); 500 feet with BRW (03/05/2023); 400 feet with BRW (04/09/2023); 152 feet with BRW and W/C follow for safety (06/03/2023); 330 feet with BRW (10/08/2023); 245 feet with BRW and SBA for safety (11/25/2023);  Goal status: Ongoing  4.   Patient will complete 5 Time Sit To Stand Test from 19.5 inch surface or lower in equal or less than 15 seconds with no UE support to demonstrate improved transfer ability for improved household and toileting mobility.  Baseline: 23 seconds with heavy B UE support on RW from 19.5 inch plinth. Pain throbbing down the  right LE.  (12/26/22); 19 seconds with heavy B UE support on RW from 19.5 inch plinth (03/05/2023); 20 seconds with heavy B UE support on RRW from 18.5 inch plinth. Painful in back of B calves, thighs, and lower back (04/09/2023); 15 seconds with heavy B UE support on BRW from 18.5 inch plinth. Painful in back of B calves, thighs, and lower back (06/03/2023); 19 seconds with heavy B UE support on BRW from 18.5 inch plinth. Painful in back of B calves, thighs, and lower back (10/08/2023); 18 seconds with heavy B UE support on BRW from 18.5 inch plinth. Painful in back of B calves, thighs, and lower back (11/25/2023);  Goal status: Progressing   5.  Patient will report worst pain equal or less than 3/10 with functional activities to improve his ability to complete basic household and community mobility. Baseline: up to 8/10 (12/26/22); reports 3/10 pain (01/31/2023); reports 4/10 pain (02/19/2023); up to 7/10 in the last 2 week s(03/05/2023): up to  7/10 in the last two weeks (04/09/2023); up to 8/10 in the last 2 weeks (06/03/2023);  up to 9/10 over the last 2 weeks (10/08/2023); up to 7-8/10 in the last 2 weeks (11/25/2023); Goal status: Ongoing  6.  Patient will demonstrate improvement in Patient Specific Functional Scale (PSFS) by equal or greater than 3/10 points to reflect clinically significant improvement in patient's most valued functional activities. Baseline: 1.7/10 for Walking Around [address], Enjoying Life, and Driving (16/10/[birth year]); 0.4/54 (11/25/2023);  Goal status: progressing    PLAN:  PT FREQUENCY: 1-2x/week  PT DURATION: 12 weeks  PLANNED INTERVENTIONS: Therapeutic exercises,  Therapeutic activity, Neuromuscular re-education, Balance training, Gait training, Patient/Family education, Self Care, Joint mobilization, Stair training, Orthotic/Fit training, DME instructions, Aquatic Therapy, Dry Needling, Electrical stimulation, Wheelchair mobility training, Spinal mobilization, Cryotherapy, Moist heat, Manual therapy, and Re-evaluation.  PLAN FOR NEXT SESSION:  update HEP as appropriate, neurodynamics, gait training, LE/core/functional strengthening, stretching, and balance as tolerated, education, manual therapy as needed. Pelvic floor.   Travis Fitzpatrick. Artemio Larry, PT, DPT 01/08/24, 5:47 PM  [facility] [address] [place], [place] [address] P: [phone] I F: [phone]

## 2024-01-13 ENCOUNTER — Encounter: Admitting: Physical Therapy

## 2024-01-14 ENCOUNTER — Ambulatory Visit: Attending: Physical Medicine & Rehabilitation | Admitting: Physical Therapy

## 2024-01-14 ENCOUNTER — Encounter: Payer: Self-pay | Admitting: Physical Therapy

## 2024-01-14 DIAGNOSIS — M5416 Radiculopathy, lumbar region: Secondary | ICD-10-CM | POA: Diagnosis present

## 2024-01-14 DIAGNOSIS — R296 Repeated falls: Secondary | ICD-10-CM | POA: Insufficient documentation

## 2024-01-14 DIAGNOSIS — R262 Difficulty in walking, not elsewhere classified: Secondary | ICD-10-CM | POA: Insufficient documentation

## 2024-01-14 DIAGNOSIS — M5459 Other low back pain: Secondary | ICD-10-CM | POA: Diagnosis present

## 2024-01-14 DIAGNOSIS — M6281 Muscle weakness (generalized): Secondary | ICD-10-CM | POA: Insufficient documentation

## 2024-01-14 DIAGNOSIS — R29818 Other symptoms and signs involving the nervous system: Secondary | ICD-10-CM | POA: Insufficient documentation

## 2024-01-14 NOTE — Therapy (Signed)
 OUTPATIENT PHYSICAL THERAPY TREATMENT   Patient Name: Travis Fitzpatrick MRN: [id number] DOB:Nov 02, 1980, 43 y.o., male Today's Date: 01/14/24   END OF SESSION:  PT End of Session - 01/14/24 1751     Visit Number 67    Number of Visits 70    Date for PT Re-Evaluation 02/17/24    Authorization Type MEDICARE PART B reporting period from 12/04/23    Progress Note Due on Visit 60    PT Start Time 1732    PT Stop Time 1810    PT Time Calculation (min) 38 min    Activity Tolerance Patient limited by pain;Patient tolerated treatment well    Behavior During Therapy [facility] for tasks assessed/performed                   Past Medical History:  Diagnosis Date   Bowel trouble    urgency   Medical history non-contributory    Urinary urgency    Past Surgical History:  Procedure Laterality Date   BACK SURGERY  2010   CIRCUMCISION     LUMBAR LAMINECTOMY/DECOMPRESSION MICRODISCECTOMY  07/20/2011   Procedure: LUMBAR LAMINECTOMY/DECOMPRESSION MICRODISCECTOMY;  Surgeon: Pasty Bongo;  Location: [facility];  Service: Neurosurgery;  Laterality: N/A;  right thoracotomy with thoracic eight-nine discectomy and fusion   LUMBAR LAMINECTOMY/DECOMPRESSION MICRODISCECTOMY Right 10/06/2021   Procedure: Right Lumbar Four-Five Microdiscectomy, Right Lumbar Five-Sacal One Foraminotomy;  Surgeon: Audie Bleacher, MD;  Location: [facility] OR;  Service: Neurosurgery;  Laterality: Right;  3C/RM 21   THORACIC DISCECTOMY  07/16/2012   Procedure: THORACIC DISCECTOMY;  Surgeon: Pasty Bongo, MD;  Location: [facility];  Service: Neurosurgery;  Laterality: Right;  RIGHT Thoracic seven-eight  thoracic diskectomy via thoracotomy by dr Sherene Dilling   THORACIC DISCECTOMY N/A 12/15/2014   Procedure: THORACIC SEVEN TO THORACIC NINE Laminectomy ;  Surgeon: Audie Bleacher, MD;  Location: [facility];  Service: Neurosurgery;  Laterality: N/A;   THORACIC DISCECTOMY N/A 06/06/2017   Procedure: LAMINECTOMY THORACIC NINE-TEN;  Surgeon:  Audie Bleacher, MD;  Location: [facility] OR;  Service: Neurosurgery;  Laterality: N/A;  LAMINECTOMY THORACIC NINE-TEN   THORACOTOMY  07/20/2011   Procedure: THORACOTOMY OPEN FOR SPINE SURGERY;  Surgeon: Gilmore Lager, MD;  Location: [facility];  Service: Vascular;  Laterality: N/A;   THORACOTOMY  07/16/2012   Procedure: THORACOTOMY OPEN FOR SPINE SURGERY;  Surgeon: Bartley Lightning, MD;  Location: [facility];  Service: Thoracic;  Laterality: N/A;   Patient Active Problem List   Diagnosis Date Noted   Spasticity 09/25/2023   Nerve pain 12/05/2022   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   S/P lumbar spinal fusion 05/25/2022   Synovial cyst of lumbar facet joint 05/25/2022   HNP (herniated nucleus pulposus), lumbar 10/06/2021   Hand pain 06/28/2021   Lumbar facet arthropathy 01/11/2021   Thoracic spondylosis with myelopathy 01/11/2021   Thoracic spinal stenosis 06/06/2017   Stenosis, spinal, thoracic 12/15/2014   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 09/22/2014   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/2011    PCP: Cherisse Cornell  REFERRING PROVIDER: Rawland Caddy, MD  REFERRING DIAG: thoracic disc disease with myelopathy, nerve pain, adhesive arachnoiditis  Rationale for Evaluation and Treatment: Rehabilitation  THERAPY DIAG:  Other low back pain  Other symptoms and signs involving the nervous system  Radiculopathy, lumbar region  Muscle weakness (generalized)  Repeated falls  Difficulty in walking, not elsewhere classified  ONSET DATE:  Suddenly started having weakness 11 years ago, most  recent episode of worsening October 2022, s/p right L4-5 laminotomy, microdiscectomy on 10/06/2021, now s/p L4-5 PLIF by Dr. Michale Age on 05/25/2022.  PERTINENT HISTORY:  Patient is a 43 y.o. male who presents to outpatient physical therapy with a referral for medical diagnosis thoracic disc disease with myelopathy, nerve pain, adhesive arachnoiditis. This  patient's chief complaints consist of disabling low back and R >L leg pain and weakness with activity, bowel and bladder urgency, incontinence, and retention, leading to the following functional deficits: difficulty with or unable to complete any activity that requires weight bearing, use of B LE, and/or balance including working, household and community mobility, walking, driving, going to family gatherings, avoiding incontinent episodes, playing with daughter, helping around the house, bed mobility, transfers. 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 Michale Age on 05/25/2022, adhesive arachnoiditis, nerve pain.  Patient denies hx of cancer, stroke, seizures, lung problems, heart problems, diabetes, unexplained weight loss, and osteoporosis.  SUBJECTIVE:                                                                                                                                                                                           SUBJECTIVE STATEMENT:     Patient states he is doing pretty good. He really hasn't had the problems as severe as usually.   PAIN:  NPRS: 2/10 mostly in low back, right groin/hip, bilateral posterior thighs, right calf.   PRECAUTIONS: Fall  PATIENT GOALS: "To get better, walk, drive again, climb stairs, play with my daughter, help around the house again"  NEXT MD VISIT:   OBJECTIVE  STRENGTH (MMT) (last tested 12/18/23):  Ankle (seated position) Dorsiflexion: R = 2/5, L = 2+/5. Plantarflexion: R = 3+/5, L = 3/5. Eversion: R = 1+/5, L =  1/5. Inversion: R = 2/5, L = 1/5.   TREATMENT Manual therapy: to reduce pain and tissue tension, improve range of motion, neuromodulation, in order to promote improved ability to complete functional activities.  Hooklying R hip caudal/posterior glide with belt, 4x45 seconds, grade IV  Therapeutic exercise: therapeutic exercises that incorporate ONE parameter at one or more areas of the body to centralize symptoms, develop strength and endurance, range of motion, and flexibility required for successful completion of functional activities.  Seated LAQ on elevated plinth with  AW+TB loop 3x10 each side with 17.5#AW + BlueTB loop anchored at base of plinth Last two reps incomplete during set 2 and 3 Pulling down back of R LE but tolerable  Superset:  Seated overhead pull down (B shoulder extension from ~120 degrees flexion to bar on thighs) with cable bar to improve core strength 1x15 at 35# (stopped due to low back/leg pain) 1x20 at 25# Increasing pain at right LE, but pt feels it is acceptable  Seated lumbar flexion roll out with theraball 1x ad lib per pt preference following first set of overhead pull down   Seated CHEST press to improve trunk extensor strength 2x15 with 23# Barbell  Neuromuscular Re-education: a technique or exercise performed with the goal of improving the level of communication between the body and the brain, such as for balance, motor control, muscle activation patterns, coordination, desensitization, quality of muscle contraction, proprioception, and/or kinesthetic sense needed for successful and safe completion of functional activities.   Seated LE extension/flexion with foot on basketball  R:  1x8 1x9 1x10 L:  1x10 1x10 1x10 Needed assistance to move ball into place and hold it still while placing LE (pt uses arms to assist placing LE).    Pt required multimodal cuing for proper technique and to facilitate improved neuromuscular control, strength, range  of motion, and functional ability resulting in improved performance and form.   PATIENT EDUCATION:  Education details: Exercise purpose/form. Self management techniques.  Person educated: patient Education method: Explanation Education comprehension: verbalized understanding and needs further education  HOME EXERCISE PROGRAM: Verbally:  - seated lumbar flexion stretch with head down and one LE extended using rollator, 1x10 each side.  - curl up with legs elevated, 5x15 seconds - sidelying open book 1x10 each side  Access Code: [email or web address] URL: [email or web address] Date: 12/18/2023 Prepared by: Alleen Isle  Exercises - Seated Diaphragmatic Breathing  - 3 reps - 1 minute practice time - Seated Pelvic Floor Elevators  - 3 x daily - 2 sets - 10 reps - Seated Ankle Plantar Flexion with Resistance Loop  - 1 x daily - 3 sets - 20 reps - Seated Ankle Dorsiflexion AROM  - 1 x daily - 3 sets - 10-30 reps - Seated Ankle Inversion AROM  - 1 x daily - 3 sets - 10-30 reps - Seated Ankle Eversion AROM  - 1 x daily - 3 sets - 10-30 reps - Sidelying Ankle Eversion Strengthening with Ankle Weight  - 1 x daily - 3 sets - 20 reps - Sidelying Ankle Inversion with Ankle Weight  - 1 x daily - 3 sets - 20 reps - Seated Ankle Dorsiflexion with Ankle Weight  - 1 x daily - 3 sets - 20 reps  ASSESSMENT:  CLINICAL IMPRESSION:     Continued working on LE and core strength and motor control as tolerated. Able to progress knee extension with AW and bands. Patient with good tolerance but intervention choices are limited due to pain with standing exercises and those that increase core intra-abdominal pressure or extend his lumbothoracic spine. Patient would benefit from continued management of limiting condition by skilled physical therapist to address remaining impairments and functional limitations to work towards stated goals and return to PLOF or maximal functional independence.    OBJECTIVE  IMPAIRMENTS: Abnormal gait, decreased activity tolerance, decreased balance, decreased coordination, decreased endurance, decreased knowledge of condition, decreased knowledge of use of DME, decreased mobility, difficulty walking, decreased ROM, decreased strength, increased edema, impaired perceived functional ability, increased  muscle spasms, impaired flexibility, impaired sensation, impaired tone, improper body mechanics, postural dysfunction, obesity, and pain.   ACTIVITY LIMITATIONS: carrying, lifting, bending, standing, squatting, stairs, transfers, bed mobility, continence, bathing, toileting, dressing, hygiene/grooming, locomotion level, and caring for others  PARTICIPATION LIMITATIONS: meal prep, cleaning, laundry, interpersonal relationship, driving, shopping, community activity, occupation, yard work, and  difficulty with or unable to complete any activity that requires weight bearing, use of B LE, and/or balance including working, household and community mobility, walking, driving, going to family gatherings, avoiding incontinent episodes, playing with daughter, helping around the house, bed mobility, transfers  PERSONAL FACTORS: Past/current experiences, Time since onset of injury/illness/exacerbation, and 3+ comorbidities:  3 thoracic spine surgeries, thoracic MRI notes "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 Michale Age on 05/25/2022, adhesive arachnoiditis, nerve pain are also affecting patient's functional outcome.   REHAB POTENTIAL: Fair due to severity and nature of condition.   CLINICAL DECISION MAKING: Evolving/moderate complexity  EVALUATION COMPLEXITY:  Moderate   GOALS: Goals reviewed with patient? No  SHORT TERM GOALS: Target date: 01/09/2023. Target date updated to 05/28/2023 for all unmet goals on 03/05/2023.   Patient will be independent with initial home exercise program for self-management of symptoms. Baseline: Initial HEP to be provided at visit 2 as appropriate (12/26/22); Goal status: MET   LONG TERM GOALS: Target date: 03/20/2023. Target date updated to 08/26/23  for all unmet goals on 06/03/2023. Target date updated to 11/20/2023 for all  unmet goals on 08/28/2023. Target date updated to 02/17/2024  for all unmet goals on 11/25/2023.  Patient will be independent with a long-term home exercise program for self-management of symptoms.  Baseline: Initial HEP to be provided at visit 2 as appropriate (12/26/22); participating as able (01/31/2023); patient currently participating as tolerated (03/05/2023; 04/09/2023; 06/03/2023); participates daily in mat exercises and in weight bearing exercises as tolerated (10/08/2023; 11/25/2023);  Goal status: In-progress  2.  Patient will demonstrate improved FOTO by equal or greater than 10 points by visit #13 to demonstrate improvement in overall condition and self-reported functional ability.  Baseline: to be tested visit 2 as appropriate (12/26/22); 30 at visit #3 (01/02/2023); 46 at visit #13 (03/05/2023); 45 at visit #29 (06/03/2023); 49 at visit #50 (10/08/2023);  Goal status: MET  3.  Patient will demonstrate the ability to ambulate equal or greater than 600 feet with LRAD during the 6 minute walk to improve his household and community mobility.  Baseline: 34 feet with bari-RW (12/26/22); 200 feet with bari-RW (01/31/2023); 500 feet with BRW (03/05/2023); 400 feet with BRW (04/09/2023); 152 feet with BRW and W/C follow for safety (06/03/2023); 330 feet with BRW (10/08/2023); 245 feet with BRW and SBA for safety (11/25/2023);  Goal status: Ongoing  4.  Patient will complete 5 Time Sit To Stand Test from 19.5  inch surface or lower in equal or less than 15 seconds with no UE support to demonstrate improved transfer ability for improved household and toileting mobility.  Baseline: 23 seconds with heavy B UE support on RW from 19.5 inch plinth. Pain throbbing down the right LE.  (12/26/22); 19 seconds with heavy B UE support on RW from 19.5 inch plinth (03/05/2023); 20 seconds with heavy B UE support on RRW  from 18.5 inch plinth. Painful in back of B calves, thighs, and lower back (04/09/2023); 15 seconds with heavy B UE support on BRW from 18.5 inch plinth. Painful in back of B calves, thighs, and lower back (06/03/2023); 19 seconds with heavy B UE support on BRW from 18.5 inch plinth. Painful in back of B calves, thighs, and lower back (10/08/2023); 18 seconds with heavy B UE support on BRW from 18.5 inch plinth. Painful in back of B calves, thighs, and lower back (11/25/2023);  Goal status: Progressing   5.  Patient will report worst pain equal or less than 3/10 with functional activities to improve his ability to complete basic household and community mobility. Baseline: up to 8/10 (12/26/22); reports 3/10 pain (01/31/2023); reports 4/10 pain (02/19/2023); up to 7/10 in the last 2 week s(03/05/2023): up to  7/10 in the last two weeks (04/09/2023); up to 8/10 in the last 2 weeks (06/03/2023);  up to 9/10 over the last 2 weeks (10/08/2023); up to 7-8/10 in the last 2 weeks (11/25/2023); Goal status: Ongoing  6.  Patient will demonstrate improvement in Patient Specific Functional Scale (PSFS) by equal or greater than 3/10 points to reflect clinically significant improvement in patient's most valued functional activities. Baseline: 1.7/10 for Walking Around [address], Enjoying Life, and Driving (16/10/96); 0.4/54 (11/25/2023);  Goal status: progressing    PLAN:  PT FREQUENCY: 1-2x/week  PT DURATION: 12 weeks  PLANNED INTERVENTIONS: Therapeutic exercises, Therapeutic activity, Neuromuscular re-education, Balance  training, Gait training, Patient/Family education, Self Care, Joint mobilization, Stair training, Orthotic/Fit training, DME instructions, Aquatic Therapy, Dry Needling, Electrical stimulation, Wheelchair mobility training, Spinal mobilization, Cryotherapy, Moist heat, Manual therapy, and Re-evaluation.  PLAN FOR NEXT SESSION:  update HEP as appropriate, neurodynamics, gait training, LE/core/functional strengthening, stretching, and balance as tolerated, education, manual therapy as needed. Pelvic floor.   Carilyn Charles. Artemio Larry, PT, DPT 01/14/24, 6:35 PM  [facility] [address] [place], [place] [address] P: [phone] I F: [phone]

## 2024-01-16 ENCOUNTER — Encounter: Payer: Self-pay | Admitting: Physical Therapy

## 2024-01-16 ENCOUNTER — Ambulatory Visit: Admitting: Physical Therapy

## 2024-01-16 DIAGNOSIS — R296 Repeated falls: Secondary | ICD-10-CM

## 2024-01-16 DIAGNOSIS — M6281 Muscle weakness (generalized): Secondary | ICD-10-CM

## 2024-01-16 DIAGNOSIS — M5459 Other low back pain: Secondary | ICD-10-CM | POA: Diagnosis not present

## 2024-01-16 DIAGNOSIS — R262 Difficulty in walking, not elsewhere classified: Secondary | ICD-10-CM

## 2024-01-16 DIAGNOSIS — M5416 Radiculopathy, lumbar region: Secondary | ICD-10-CM

## 2024-01-16 DIAGNOSIS — R29818 Other symptoms and signs involving the nervous system: Secondary | ICD-10-CM

## 2024-01-16 NOTE — Therapy (Signed)
 OUTPATIENT PHYSICAL THERAPY TREATMENT   Patient Name: Travis Fitzpatrick MRN: [id number] DOB:[birth year], 43 y.o., male Today's Date: 01/16/24   END OF SESSION:  PT End of Session - 01/16/24 1735     Visit Number 68    Number of Visits 70    Date for PT Re-Evaluation 02/17/24    Authorization Type MEDICARE PART B reporting period from 12/04/23    Progress Note Due on Visit 60    PT Start Time 1734    PT Stop Time 1812    PT Time Calculation (min) 38 min    Activity Tolerance Patient limited by pain;Patient tolerated treatment well    Behavior During Therapy [facility] for tasks assessed/performed                    Past Medical History:  Diagnosis Date   Bowel trouble    urgency   Medical history non-contributory    Urinary urgency    Past Surgical History:  Procedure Laterality Date   BACK SURGERY  2010   CIRCUMCISION     LUMBAR LAMINECTOMY/DECOMPRESSION MICRODISCECTOMY  07/20/2011   Procedure: LUMBAR LAMINECTOMY/DECOMPRESSION MICRODISCECTOMY;  Surgeon: Pasty Bongo;  Location: [facility];  Service: Neurosurgery;  Laterality: N/A;  right thoracotomy with thoracic eight-nine discectomy and fusion   LUMBAR LAMINECTOMY/DECOMPRESSION MICRODISCECTOMY Right 10/06/2021   Procedure: Right Lumbar Four-Five Microdiscectomy, Right Lumbar Five-Sacal One Foraminotomy;  Surgeon: Audie Bleacher, MD;  Location: [facility];  Service: Neurosurgery;  Laterality: Right;  3C/RM 21   THORACIC DISCECTOMY  07/16/2012   Procedure: THORACIC DISCECTOMY;  Surgeon: Pasty Bongo, MD;  Location: [facility];  Service: Neurosurgery;  Laterality: Right;  RIGHT Thoracic seven-eight  thoracic diskectomy via thoracotomy by dr Sherene Dilling   THORACIC DISCECTOMY N/A 12/15/2014   Procedure: THORACIC SEVEN TO THORACIC NINE Laminectomy ;  Surgeon: Audie Bleacher, MD;  Location: [facility];  Service: Neurosurgery;  Laterality: N/A;   THORACIC DISCECTOMY N/A 06/06/2017   Procedure: LAMINECTOMY THORACIC NINE-TEN;  Surgeon:  Audie Bleacher, MD;  Location: [facility];  Service: Neurosurgery;  Laterality: N/A;  LAMINECTOMY THORACIC NINE-TEN   THORACOTOMY  07/20/2011   Procedure: THORACOTOMY OPEN FOR SPINE SURGERY;  Surgeon: Gilmore Lager, MD;  Location: [facility];  Service: Vascular;  Laterality: N/A;   THORACOTOMY  07/16/2012   Procedure: THORACOTOMY OPEN FOR SPINE SURGERY;  Surgeon: Bartley Lightning, MD;  Location: [facility];  Service: Thoracic;  Laterality: N/A;   Patient Active Problem List   Diagnosis Date Noted   Spasticity 09/25/2023   Nerve pain 12/05/2022   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   S/P lumbar spinal fusion 05/25/2022   Synovial cyst of lumbar facet joint 05/25/2022   HNP (herniated nucleus pulposus), lumbar 10/06/2021   Hand pain 06/28/2021   Lumbar facet arthropathy 01/11/2021   Thoracic spondylosis with myelopathy 01/11/2021   Thoracic spinal stenosis 06/06/2017   Stenosis, spinal, thoracic 12/15/2014   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 09/22/2014   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/2011    PCP: Cherisse Cornell  REFERRING PROVIDER: Rawland Caddy, MD  REFERRING DIAG: thoracic disc disease with myelopathy, nerve pain, adhesive arachnoiditis  Rationale for Evaluation and Treatment: Rehabilitation  THERAPY DIAG:  Other low back pain  Other symptoms and signs involving the nervous system  Radiculopathy, lumbar region  Muscle weakness (generalized)  Repeated falls  Difficulty in walking, not elsewhere classified  ONSET DATE:  Suddenly started having weakness 11 years ago,  most recent episode of worsening October 2022, s/p right L4-5 laminotomy, microdiscectomy on 10/06/2021, now s/p L4-5 PLIF by Dr. Michale Age on 05/25/2022.  PERTINENT HISTORY:  Patient is a 43 y.o. male who presents to outpatient physical therapy with a referral for medical diagnosis thoracic disc disease with myelopathy, nerve pain, adhesive arachnoiditis. This  patient's chief complaints consist of disabling low back and R >L leg pain and weakness with activity, bowel and bladder urgency, incontinence, and retention, leading to the following functional deficits: difficulty with or unable to complete any activity that requires weight bearing, use of B LE, and/or balance including working, household and community mobility, walking, driving, going to family gatherings, avoiding incontinent episodes, playing with daughter, helping around the house, bed mobility, transfers. 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 Michale Age on 05/25/2022, adhesive arachnoiditis, nerve pain.  Patient denies hx of cancer, stroke, seizures, lung problems, heart problems, diabetes, unexplained weight loss, and osteoporosis.  SUBJECTIVE:                                                                                                                                                                                           SUBJECTIVE STATEMENT:     Patient states he has nauseated and lightheaded today. He was much worse with these symptoms last night and also had photo- and phono- sensitivity. He had to lay down in the dark and he felt better when he woke up.  He felt good soreness after last PT session. The soreness was in his thighs and the back of his legs.   PAIN:  NPRS: 2/10 mostly in low back, right groin/hip, bilateral posterior thighs, right calf.   PRECAUTIONS: Fall  PATIENT GOALS: "To get better, walk, drive again, climb stairs, play with my daughter, help around  the house again"  NEXT MD VISIT:   OBJECTIVE  STRENGTH (MMT) (last tested 12/18/23):  Ankle (seated position) Dorsiflexion: R = 2/5, L = 2+/5. Plantarflexion: R = 3+/5, L = 3/5. Eversion: R = 1+/5, L = 1/5. Inversion: R = 2/5, L = 1/5.   TREATMENT Manual therapy: to reduce pain and tissue tension, improve range of motion, neuromodulation, in order to promote improved ability to complete functional activities.  Hooklying R hip caudal/posterior glide with belt, 4x45 seconds,  grade IV  Therapeutic exercise: therapeutic exercises that incorporate ONE parameter at one or more areas of the body to centralize symptoms, develop strength and endurance, range of motion, and flexibility required for successful completion of functional activities.  Seated LAQ on elevated plinth with AW+TB loop 3x10 each side with 17.5#AW + BlueTB loop anchored at base of plinth Last two reps incomplete during set 2 and 3 Pulling down back of R LE but tolerable  Seated lumbar flexion roll out with theraball 1x ad lib per pt preference following first set of overhead pull down  Superset:  Seated overhead pull down (B shoulder extension from ~120 degrees flexion to bar on thighs) with cable bar to improve core strength 3x20 at 25#    Seated CHEST press to improve trunk extensor strength 2x15 with 23# Barbell  Neuromuscular Re-education: a technique or exercise performed with the goal of improving the level of communication between the body and the brain, such as for balance, motor control, muscle activation patterns, coordination, desensitization, quality of muscle contraction, proprioception, and/or kinesthetic sense needed for successful and safe completion of functional activities.   Seated LE extension/flexion with foot on basketball  R:  1x10 1x10 1x10 L:  1x10 1x10 1x10 Needed assistance to move ball into place and hold it still while placing LE (pt uses arms to assist placing LE).    Pt required  multimodal cuing for proper technique and to facilitate improved neuromuscular control, strength, range of motion, and functional ability resulting in improved performance and form.   PATIENT EDUCATION:  Education details: Exercise purpose/form. Self management techniques.  Person educated: patient Education method: Explanation Education comprehension: verbalized understanding and needs further education  HOME EXERCISE PROGRAM: Verbally:  - seated lumbar flexion stretch with head down and one LE extended using rollator, 1x10 each side.  - curl up with legs elevated, 5x15 seconds - sidelying open book 1x10 each side  Access Code: [email or web address] URL: [email or web address] Date: 12/18/2023 Prepared by: Alleen Isle  Exercises - Seated Diaphragmatic Breathing  - 3 reps - 1 minute practice time - Seated Pelvic Floor Elevators  - 3 x daily - 2 sets - 10 reps - Seated Ankle Plantar Flexion with Resistance Loop  - 1 x daily - 3 sets - 20 reps - Seated Ankle Dorsiflexion AROM  - 1 x daily - 3 sets - 10-30 reps - Seated Ankle Inversion AROM  - 1 x daily - 3 sets - 10-30 reps - Seated Ankle Eversion AROM  - 1 x daily - 3 sets - 10-30 reps - Sidelying Ankle Eversion Strengthening with Ankle Weight  - 1 x daily - 3 sets - 20 reps - Sidelying Ankle Inversion with Ankle Weight  - 1 x daily - 3 sets - 20 reps - Seated Ankle Dorsiflexion with Ankle Weight  - 1 x daily - 3 sets - 20 reps  ASSESSMENT:  CLINICAL IMPRESSION:     Patient with episode of nausea, photo- and phono- sensitivity, and dizziness yesterday that is lingering today. Also with DOMS in thighs from last PT session so continued with similar exercises as tolerated. Patient was able to complete exercises with lights dimmed to help with constitutional symptoms. He continues to have severe limitations due to LE neurogenic weakness and pain. Patient would benefit from continued management of limiting condition by skilled physical  therapist to address remaining impairments and functional limitations to work towards stated goals and return to PLOF or maximal functional independence.   OBJECTIVE IMPAIRMENTS:  Abnormal gait, decreased activity tolerance, decreased balance, decreased coordination, decreased endurance, decreased knowledge of condition, decreased knowledge of use of DME, decreased mobility, difficulty walking, decreased ROM, decreased strength, increased edema, impaired perceived functional ability, increased muscle spasms, impaired flexibility, impaired sensation, impaired tone, improper body mechanics, postural dysfunction, obesity, and pain.   ACTIVITY LIMITATIONS: carrying, lifting, bending, standing, squatting, stairs, transfers, bed mobility, continence, bathing, toileting, dressing, hygiene/grooming, locomotion level, and caring for others  PARTICIPATION LIMITATIONS: meal prep, cleaning, laundry, interpersonal relationship, driving, shopping, community activity, occupation, yard work, and  difficulty with or unable to complete any activity that requires weight bearing, use of B LE, and/or balance including working, household and community mobility, walking, driving, going to family gatherings, avoiding incontinent episodes, playing with daughter, helping around the house, bed mobility, transfers  PERSONAL FACTORS: Past/current experiences, Time since onset of injury/illness/exacerbation, and 3+ comorbidities:  3 thoracic spine surgeries, thoracic MRI notes "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 Michale Age on 05/25/2022, adhesive arachnoiditis, nerve pain are also affecting patient's functional  outcome.   REHAB POTENTIAL: Fair due to severity and nature of condition.   CLINICAL DECISION MAKING: Evolving/moderate complexity  EVALUATION COMPLEXITY: Moderate   GOALS: Goals reviewed with patient? No  SHORT TERM GOALS: Target date: 01/09/2023. Target date updated to 05/28/2023 for all unmet goals on 03/05/2023.   Patient will be independent with initial home exercise program for self-management of symptoms. Baseline: Initial HEP to be provided at visit 2 as appropriate (12/26/22); Goal status: MET   LONG TERM GOALS: Target date: 03/20/2023. Target date updated to 08/26/23  for all unmet goals on 06/03/2023. Target date updated to 11/20/2023 for all  unmet goals on 08/28/2023. Target date updated to 02/17/2024  for all unmet goals on 11/25/2023.  Patient will be independent with a long-term home exercise program for self-management of symptoms.  Baseline: Initial HEP to be provided at visit 2 as appropriate (12/26/22); participating as able (01/31/2023); patient currently participating as tolerated (03/05/2023; 04/09/2023; 06/03/2023); participates daily in mat exercises and in weight bearing exercises as tolerated (10/08/2023; 11/25/2023);  Goal status: In-progress  2.  Patient will demonstrate improved FOTO by equal or greater than 10 points by visit #13 to demonstrate improvement in overall condition and self-reported functional ability.  Baseline: to be tested visit 2 as appropriate (12/26/22); 30 at visit #3 (01/02/2023); 46 at visit #13 (03/05/2023); 45 at visit #29 (06/03/2023); 49 at visit #50 (10/08/2023);  Goal status: MET  3.  Patient will demonstrate the ability to ambulate equal or greater than 600 feet with LRAD during the 6 minute walk to improve his household and community mobility.  Baseline: 34 feet with bari-RW (12/26/22); 200 feet with bari-RW (01/31/2023); 500 feet with BRW (03/05/2023); 400 feet with BRW (04/09/2023); 152 feet with BRW and W/C follow for safety (06/03/2023); 330  feet with BRW (10/08/2023); 245 feet with BRW and SBA for safety (11/25/2023);  Goal status: Ongoing  4.  Patient will complete 5 Time Sit To Stand Test from 19.5 inch surface or lower in equal or less than 15 seconds with no UE support to demonstrate improved transfer ability for improved household and toileting mobility.  Baseline: 23 seconds with heavy B UE support on  RW from 19.5 inch plinth. Pain throbbing down the right LE.  (12/26/22); 19 seconds with heavy B UE support on RW from 19.5 inch plinth (03/05/2023); 20 seconds with heavy B UE support on RRW from 18.5 inch plinth. Painful in back of B calves, thighs, and lower back (04/09/2023); 15 seconds with heavy B UE support on BRW from 18.5 inch plinth. Painful in back of B calves, thighs, and lower back (06/03/2023); 19 seconds with heavy B UE support on BRW from 18.5 inch plinth. Painful in back of B calves, thighs, and lower back (10/08/2023); 18 seconds with heavy B UE support on BRW from 18.5 inch plinth. Painful in back of B calves, thighs, and lower back (11/25/2023);  Goal status: Progressing   5.  Patient will report worst pain equal or less than 3/10 with functional activities to improve his ability to complete basic household and community mobility. Baseline: up to 8/10 (12/26/22); reports 3/10 pain (01/31/2023); reports 4/10 pain (02/19/2023); up to 7/10 in the last 2 week s(03/05/2023): up to  7/10 in the last two weeks (04/09/2023); up to 8/10 in the last 2 weeks (06/03/2023);  up to 9/10 over the last 2 weeks (10/08/2023); up to 7-8/10 in the last 2 weeks (11/25/2023); Goal status: Ongoing  6.  Patient will demonstrate improvement in Patient Specific Functional Scale (PSFS) by equal or greater than 3/10 points to reflect clinically significant improvement in patient's most valued functional activities. Baseline: 1.7/10 for Walking Around [address], Enjoying Life, and Driving (45/40/98); 1.1/91 (11/25/2023);  Goal status:  progressing    PLAN:  PT FREQUENCY: 1-2x/week  PT DURATION: 12 weeks  PLANNED INTERVENTIONS: Therapeutic exercises, Therapeutic activity, Neuromuscular re-education, Balance training, Gait training, Patient/Family education, Self Care, Joint mobilization, Stair training, Orthotic/Fit training, DME instructions, Aquatic Therapy, Dry Needling, Electrical stimulation, Wheelchair mobility training, Spinal mobilization, Cryotherapy, Moist heat, Manual therapy, and Re-evaluation.  PLAN FOR NEXT SESSION:  update HEP as appropriate, neurodynamics, gait training, LE/core/functional strengthening, stretching, and balance as tolerated, education, manual therapy as needed. Pelvic floor.   Carilyn Charles. Artemio Larry, PT, DPT 01/16/24, 6:27 PM  [facility] [place] [facility] [address] [place], [place] [address] P: [phone] I F: [phone]

## 2024-01-18 ENCOUNTER — Other Ambulatory Visit: Payer: Self-pay | Admitting: Medical Genetics

## 2024-01-20 ENCOUNTER — Encounter: Payer: Self-pay | Admitting: Physical Therapy

## 2024-01-20 ENCOUNTER — Ambulatory Visit: Admitting: Physical Therapy

## 2024-01-20 DIAGNOSIS — M5459 Other low back pain: Secondary | ICD-10-CM | POA: Diagnosis not present

## 2024-01-20 DIAGNOSIS — M5416 Radiculopathy, lumbar region: Secondary | ICD-10-CM

## 2024-01-20 DIAGNOSIS — R296 Repeated falls: Secondary | ICD-10-CM

## 2024-01-20 DIAGNOSIS — R262 Difficulty in walking, not elsewhere classified: Secondary | ICD-10-CM

## 2024-01-20 DIAGNOSIS — M6281 Muscle weakness (generalized): Secondary | ICD-10-CM

## 2024-01-20 DIAGNOSIS — R29818 Other symptoms and signs involving the nervous system: Secondary | ICD-10-CM

## 2024-01-20 NOTE — Therapy (Signed)
 OUTPATIENT PHYSICAL THERAPY TREATMENT   Patient Name: Travis Fitzpatrick MRN: [id number] DOB:04/22/1981, 43 y.o., male Today's Date: 01/20/24   END OF SESSION:  PT End of Session - 01/20/24 1841     Visit Number 69    Number of Visits 70    Date for PT Re-Evaluation 02/17/24    Authorization Type MEDICARE PART B reporting period from 12/04/23    Progress Note Due on Visit 60    PT Start Time 1651    PT Stop Time 1732    PT Time Calculation (min) 41 min    Activity Tolerance Patient limited by pain;Patient tolerated treatment well    Behavior During Therapy [facility] for tasks assessed/performed                     Past Medical History:  Diagnosis Date   Bowel trouble    urgency   Medical history non-contributory    Urinary urgency    Past Surgical History:  Procedure Laterality Date   BACK SURGERY  2010   CIRCUMCISION     LUMBAR LAMINECTOMY/DECOMPRESSION MICRODISCECTOMY  07/20/2011   Procedure: LUMBAR LAMINECTOMY/DECOMPRESSION MICRODISCECTOMY;  Surgeon: Pasty Bongo;  Location: [facility];  Service: Neurosurgery;  Laterality: N/A;  right thoracotomy with thoracic eight-nine discectomy and fusion   LUMBAR LAMINECTOMY/DECOMPRESSION MICRODISCECTOMY Right 10/06/2021   Procedure: Right Lumbar Four-Five Microdiscectomy, Right Lumbar Five-Sacal One Foraminotomy;  Surgeon: Audie Bleacher, MD;  Location: [facility];  Service: Neurosurgery;  Laterality: Right;  3C/RM 21   THORACIC DISCECTOMY  07/16/2012   Procedure: THORACIC DISCECTOMY;  Surgeon: Pasty Bongo, MD;  Location: [facility];  Service: Neurosurgery;  Laterality: Right;  RIGHT Thoracic seven-eight  thoracic diskectomy via thoracotomy by dr Sherene Dilling   THORACIC DISCECTOMY N/A 12/15/2014   Procedure: THORACIC SEVEN TO THORACIC NINE Laminectomy ;  Surgeon: Audie Bleacher, MD;  Location: [facility];  Service: Neurosurgery;  Laterality: N/A;   THORACIC DISCECTOMY N/A 06/06/2017   Procedure: LAMINECTOMY THORACIC NINE-TEN;  Surgeon:  Audie Bleacher, MD;  Location: [facility] OR;  Service: Neurosurgery;  Laterality: N/A;  LAMINECTOMY THORACIC NINE-TEN   THORACOTOMY  07/20/2011   Procedure: THORACOTOMY OPEN FOR SPINE SURGERY;  Surgeon: Gilmore Lager, MD;  Location: [facility];  Service: Vascular;  Laterality: N/A;   THORACOTOMY  07/16/2012   Procedure: THORACOTOMY OPEN FOR SPINE SURGERY;  Surgeon: Bartley Lightning, MD;  Location: [facility];  Service: Thoracic;  Laterality: N/A;   Patient Active Problem List   Diagnosis Date Noted   Spasticity 09/25/2023   Nerve pain 12/05/2022   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   S/P lumbar spinal fusion 05/25/2022   Synovial cyst of lumbar facet joint 05/25/2022   HNP (herniated nucleus pulposus), lumbar 10/06/2021   Hand pain 06/28/2021   Lumbar facet arthropathy 01/11/2021   Thoracic spondylosis with myelopathy 01/11/2021   Thoracic spinal stenosis 06/06/2017   Stenosis, spinal, thoracic 12/15/2014   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 09/22/2014   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/2011    PCP: Cherisse Cornell  REFERRING PROVIDER: Rawland Caddy, MD  REFERRING DIAG: thoracic disc disease with myelopathy, nerve pain, adhesive arachnoiditis  Rationale for Evaluation and Treatment: Rehabilitation  THERAPY DIAG:  Other low back pain  Other symptoms and signs involving the nervous system  Radiculopathy, lumbar region  Muscle weakness (generalized)  Repeated falls  Difficulty in walking, not elsewhere classified  ONSET DATE:  Suddenly started having weakness 11 years  ago, most recent episode of worsening October 2022, s/p right L4-5 laminotomy, microdiscectomy on 10/06/2021, now s/p L4-5 PLIF by Dr. Michale Age on 05/25/2022.  PERTINENT HISTORY:  Patient is a 43 y.o. male who presents to outpatient physical therapy with a referral for medical diagnosis thoracic disc disease with myelopathy, nerve pain, adhesive arachnoiditis. This  patient's chief complaints consist of disabling low back and R >L leg pain and weakness with activity, bowel and bladder urgency, incontinence, and retention, leading to the following functional deficits: difficulty with or unable to complete any activity that requires weight bearing, use of B LE, and/or balance including working, household and community mobility, walking, driving, going to family gatherings, avoiding incontinent episodes, playing with daughter, helping around the house, bed mobility, transfers. 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 Michale Age on 05/25/2022, adhesive arachnoiditis, nerve pain.  Patient denies hx of cancer, stroke, seizures, lung problems, heart problems, diabetes, unexplained weight loss, and osteoporosis.  SUBJECTIVE:                                                                                                                                                                                           SUBJECTIVE STATEMENT:     Patient states he is feeing better without the psychomotor symptoms today. He felt okay after last PT session except the psychomotor symptoms. He also had a little abdominal muscle and quad soreness.   PAIN:  NPRS: 2/10 mostly in low back, right groin/hip, bilateral posterior thighs, right calf.   PRECAUTIONS: Fall  PATIENT GOALS: "To get better, walk, drive again, climb stairs, play with my daughter, help around the house again"  NEXT MD VISIT:   OBJECTIVE  STRENGTH (MMT) (last tested 12/18/23):  Ankle (seated  position) Dorsiflexion: R = 2/5, L = 2+/5. Plantarflexion: R = 3+/5, L = 3/5. Eversion: R = 1+/5, L = 1/5. Inversion: R = 2/5, L = 1/5.   TREATMENT Manual therapy: to reduce pain and tissue tension, improve range of motion, neuromodulation, in order to promote improved ability to complete functional activities.  Hooklying R hip caudal/posterior glide with belt, 3x45-60 seconds, grade IV  Therapeutic exercise: therapeutic exercises that incorporate ONE parameter at one or more areas of the body to centralize symptoms, develop strength and endurance, range of  motion, and flexibility required for successful completion of functional activities.  Seated LAQ on elevated plinth with AW+TB loop 3x12 each side with 17.5#AW + BlueTB loop anchored at base of plinth Last two reps incomplete during set 2 and 3 Pulling down back of R LE but tolerable  Superset:  Seated overhead pull down (B shoulder extension from ~120 degrees flexion to bar on thighs) with cable bar to improve core strength 3x20 at 35#    Seated CHEST press to improve trunk extensor strength 3x15 with 23# Barbell  Seated lumbar flexion roll out with theraball 2x ad lib per pt preference to help calm pain   Neuromuscular Re-education: a technique or exercise performed with the goal of improving the level of communication between the body and the brain, such as for balance, motor control, muscle activation patterns, coordination, desensitization, quality of muscle contraction, proprioception, and/or kinesthetic sense needed for successful and safe completion of functional activities.   Seated LE extension/flexion with foot on basketball  R:  3x12 L:  3x12  Therapeutic activities: dynamic therapeutic activities incorporating MULTIPLE parameters or areas of the body designed to achieve improved functional performance. 2x5 TRX butt tap on 17 chair+a2z pad+airex pad PT holding chair still    Pt required multimodal cuing for  proper technique and to facilitate improved neuromuscular control, strength, range of motion, and functional ability resulting in improved performance and form.   PATIENT EDUCATION:  Education details: Exercise purpose/form. Self management techniques.  Person educated: patient Education method: Explanation Education comprehension: verbalized understanding and needs further education  HOME EXERCISE PROGRAM: Verbally:  - seated lumbar flexion stretch with head down and one LE extended using rollator, 1x10 each side.  - curl up with legs elevated, 5x15 seconds - sidelying open book 1x10 each side  Access Code: [email or web address] URL: [email or web address] Date: 12/18/2023 Prepared by: Alleen Isle  Exercises - Seated Diaphragmatic Breathing  - 3 reps - 1 minute practice time - Seated Pelvic Floor Elevators  - 3 x daily - 2 sets - 10 reps - Seated Ankle Plantar Flexion with Resistance Loop  - 1 x daily - 3 sets - 20 reps - Seated Ankle Dorsiflexion AROM  - 1 x daily - 3 sets - 10-30 reps - Seated Ankle Inversion AROM  - 1 x daily - 3 sets - 10-30 reps - Seated Ankle Eversion AROM  - 1 x daily - 3 sets - 10-30 reps - Sidelying Ankle Eversion Strengthening with Ankle Weight  - 1 x daily - 3 sets - 20 reps - Sidelying Ankle Inversion with Ankle Weight  - 1 x daily - 3 sets - 20 reps - Seated Ankle Dorsiflexion with Ankle Weight  - 1 x daily - 3 sets - 20 reps  ASSESSMENT:  CLINICAL IMPRESSION:     Patient recovered from psychomotor symptoms today and able to tolerate progression in loading with overhead pull today and all three sets of chest press in seated. Showing mild improvements in LE coordination as he is now able to complete all three sets of hamstring ball rolls with 12 reps per set. He continues to be severely limited in functional mobility by LE neurogenic weakness and pain. Patient would benefit from continued management of limiting condition by skilled physical therapist to  address remaining impairments and functional limitations to work towards stated goals and return to PLOF or maximal functional independence.   OBJECTIVE IMPAIRMENTS: Abnormal gait, decreased activity tolerance, decreased balance, decreased coordination, decreased endurance, decreased knowledge of condition,  decreased knowledge of use of DME, decreased mobility, difficulty walking, decreased ROM, decreased strength, increased edema, impaired perceived functional ability, increased muscle spasms, impaired flexibility, impaired sensation, impaired tone, improper body mechanics, postural dysfunction, obesity, and pain.   ACTIVITY LIMITATIONS: carrying, lifting, bending, standing, squatting, stairs, transfers, bed mobility, continence, bathing, toileting, dressing, hygiene/grooming, locomotion level, and caring for others  PARTICIPATION LIMITATIONS: meal prep, cleaning, laundry, interpersonal relationship, driving, shopping, community activity, occupation, yard work, and  difficulty with or unable to complete any activity that requires weight bearing, use of B LE, and/or balance including working, household and community mobility, walking, driving, going to family gatherings, avoiding incontinent episodes, playing with daughter, helping around the house, bed mobility, transfers  PERSONAL FACTORS: Past/current experiences, Time since onset of injury/illness/exacerbation, and 3+ comorbidities:  3 thoracic spine surgeries, thoracic MRI notes "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 Michale Age on 05/25/2022, adhesive arachnoiditis, nerve pain are also affecting patient's functional outcome.   REHAB  POTENTIAL: Fair due to severity and nature of condition.   CLINICAL DECISION MAKING: Evolving/moderate complexity  EVALUATION COMPLEXITY: Moderate   GOALS: Goals reviewed with patient? No  SHORT TERM GOALS: Target date: 01/09/2023. Target date updated to 05/28/2023 for all unmet goals on 03/05/2023.   Patient will be independent with initial home exercise program for self-management of symptoms. Baseline: Initial HEP to be provided at visit 2 as appropriate (12/26/22); Goal status: MET   LONG TERM GOALS: Target date: 03/20/2023. Target date updated to 08/26/23  for all unmet goals on 06/03/2023. Target date updated to 11/20/2023 for all  unmet goals on 08/28/2023. Target date updated to 02/17/2024  for all unmet goals on 11/25/2023.  Patient will be independent with a long-term home exercise program for self-management of symptoms.  Baseline: Initial HEP to be provided at visit 2 as appropriate (12/26/22); participating as able (01/31/2023); patient currently participating as tolerated (03/05/2023; 04/09/2023; 06/03/2023); participates daily in mat exercises and in weight bearing exercises as tolerated (10/08/2023; 11/25/2023);  Goal status: In-progress  2.  Patient will demonstrate improved FOTO by equal or greater than 10 points by visit #13 to demonstrate improvement in overall condition and self-reported functional ability.  Baseline: to be tested visit 2 as appropriate (12/26/22); 30 at visit #3 (01/02/2023); 46 at visit #13 (03/05/2023); 45 at visit #29 (06/03/2023); 49 at visit #50 (10/08/2023);  Goal status: MET  3.  Patient will demonstrate the ability to ambulate equal or greater than 600 feet with LRAD during the 6 minute walk to improve his household and community mobility.  Baseline: 34 feet with bari-RW (12/26/22); 200 feet with bari-RW (01/31/2023); 500 feet with BRW (03/05/2023); 400 feet with BRW (04/09/2023); 152 feet with BRW and W/C follow for safety (06/03/2023); 330 feet with BRW  (10/08/2023); 245 feet with BRW and SBA for safety (11/25/2023);  Goal status: Ongoing  4.  Patient will complete 5 Time Sit To Stand Test from 19.5 inch surface or lower in equal or less than 15 seconds with no UE support to demonstrate improved transfer ability for improved household and toileting mobility.  Baseline: 23 seconds with heavy B UE support on RW from 19.5 inch plinth. Pain throbbing down the right LE.  (12/26/22); 19 seconds  with heavy B UE support on RW from 19.5 inch plinth (03/05/2023); 20 seconds with heavy B UE support on RRW from 18.5 inch plinth. Painful in back of B calves, thighs, and lower back (04/09/2023); 15 seconds with heavy B UE support on BRW from 18.5 inch plinth. Painful in back of B calves, thighs, and lower back (06/03/2023); 19 seconds with heavy B UE support on BRW from 18.5 inch plinth. Painful in back of B calves, thighs, and lower back (10/08/2023); 18 seconds with heavy B UE support on BRW from 18.5 inch plinth. Painful in back of B calves, thighs, and lower back (11/25/2023);  Goal status: Progressing   5.  Patient will report worst pain equal or less than 3/10 with functional activities to improve his ability to complete basic household and community mobility. Baseline: up to 8/10 (12/26/22); reports 3/10 pain (01/31/2023); reports 4/10 pain (02/19/2023); up to 7/10 in the last 2 week s(03/05/2023): up to  7/10 in the last two weeks (04/09/2023); up to 8/10 in the last 2 weeks (06/03/2023);  up to 9/10 over the last 2 weeks (10/08/2023); up to 7-8/10 in the last 2 weeks (11/25/2023); Goal status: Ongoing  6.  Patient will demonstrate improvement in Patient Specific Functional Scale (PSFS) by equal or greater than 3/10 points to reflect clinically significant improvement in patient's most valued functional activities. Baseline: 1.7/10 for Walking Around [address], Enjoying Life, and Driving (11/91/47); 8.2/95 (11/25/2023);  Goal status: progressing    PLAN:  PT FREQUENCY:  1-2x/week  PT DURATION: 12 weeks  PLANNED INTERVENTIONS: Therapeutic exercises, Therapeutic activity, Neuromuscular re-education, Balance training, Gait training, Patient/Family education, Self Care, Joint mobilization, Stair training, Orthotic/Fit training, DME instructions, Aquatic Therapy, Dry Needling, Electrical stimulation, Wheelchair mobility training, Spinal mobilization, Cryotherapy, Moist heat, Manual therapy, and Re-evaluation.  PLAN FOR NEXT SESSION:  update HEP as appropriate, neurodynamics, gait training, LE/core/functional strengthening, stretching, and balance as tolerated, education, manual therapy as needed. Pelvic floor.   Carilyn Charles. Artemio Larry, PT, DPT 01/20/24, 6:44 PM  [facility] [address] [place], [place] [address] P: [phone] I F: [phone]

## 2024-01-22 ENCOUNTER — Ambulatory Visit: Admitting: Physical Therapy

## 2024-01-22 ENCOUNTER — Encounter: Payer: Self-pay | Admitting: Physical Therapy

## 2024-01-22 DIAGNOSIS — R29818 Other symptoms and signs involving the nervous system: Secondary | ICD-10-CM

## 2024-01-22 DIAGNOSIS — M5459 Other low back pain: Secondary | ICD-10-CM | POA: Diagnosis not present

## 2024-01-22 DIAGNOSIS — R262 Difficulty in walking, not elsewhere classified: Secondary | ICD-10-CM

## 2024-01-22 DIAGNOSIS — R296 Repeated falls: Secondary | ICD-10-CM

## 2024-01-22 DIAGNOSIS — M5416 Radiculopathy, lumbar region: Secondary | ICD-10-CM

## 2024-01-22 DIAGNOSIS — M6281 Muscle weakness (generalized): Secondary | ICD-10-CM

## 2024-01-22 NOTE — Therapy (Signed)
 OUTPATIENT PHYSICAL THERAPY TREATMENT / PROGRESS NOTE / RE-CERTIFICATION Dates of reporting from 12/04/2023 to 01/22/2024  Patient Name: Travis Fitzpatrick MRN: [id number] DOB:05-31-1981, 43 y.o., male Today's Date: 01/22/24   END OF SESSION:  PT End of Session - 01/22/24 1802     Visit Number 70    Number of Visits 93    Date for PT Re-Evaluation 04/15/24    Authorization Type MEDICARE PART B reporting period from 12/04/23    Progress Note Due on Visit 60    PT Start Time 1651    PT Stop Time 1734    PT Time Calculation (min) 43 min    Activity Tolerance Patient limited by pain;Patient tolerated treatment well    Behavior During Therapy [facility] for tasks assessed/performed                      Past Medical History:  Diagnosis Date   Bowel trouble    urgency   Medical history non-contributory    Urinary urgency    Past Surgical History:  Procedure Laterality Date   BACK SURGERY  2010   CIRCUMCISION     LUMBAR LAMINECTOMY/DECOMPRESSION MICRODISCECTOMY  07/20/2011   Procedure: LUMBAR LAMINECTOMY/DECOMPRESSION MICRODISCECTOMY;  Surgeon: Pasty Bongo;  Location: [facility];  Service: Neurosurgery;  Laterality: N/A;  right thoracotomy with thoracic eight-nine discectomy and fusion   LUMBAR LAMINECTOMY/DECOMPRESSION MICRODISCECTOMY Right 10/06/2021   Procedure: Right Lumbar Four-Five Microdiscectomy, Right Lumbar Five-Sacal One Foraminotomy;  Surgeon: Audie Bleacher, MD;  Location: [facility];  Service: Neurosurgery;  Laterality: Right;  3C/RM 21   THORACIC DISCECTOMY  07/16/2012   Procedure: THORACIC DISCECTOMY;  Surgeon: Pasty Bongo, MD;  Location: [facility];  Service: Neurosurgery;  Laterality: Right;  RIGHT Thoracic seven-eight  thoracic diskectomy via thoracotomy by dr Sherene Dilling   THORACIC DISCECTOMY N/A 12/15/2014   Procedure: THORACIC SEVEN TO THORACIC NINE Laminectomy ;  Surgeon: Audie Bleacher, MD;  Location: [facility];  Service: Neurosurgery;  Laterality: N/A;    THORACIC DISCECTOMY N/A 06/06/2017   Procedure: LAMINECTOMY THORACIC NINE-TEN;  Surgeon: Audie Bleacher, MD;  Location: [facility];  Service: Neurosurgery;  Laterality: N/A;  LAMINECTOMY THORACIC NINE-TEN   THORACOTOMY  07/20/2011   Procedure: THORACOTOMY OPEN FOR SPINE SURGERY;  Surgeon: Gilmore Lager, MD;  Location: [facility];  Service: Vascular;  Laterality: N/A;   THORACOTOMY  07/16/2012   Procedure: THORACOTOMY OPEN FOR SPINE SURGERY;  Surgeon: Bartley Lightning, MD;  Location: [facility];  Service: Thoracic;  Laterality: N/A;   Patient Active Problem List   Diagnosis Date Noted   Spasticity 09/25/2023   Nerve pain 12/05/2022   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   S/P lumbar spinal fusion 05/25/2022   Synovial cyst of lumbar facet joint 05/25/2022   HNP (herniated nucleus pulposus), lumbar 10/06/2021   Hand pain 06/28/2021   Lumbar facet arthropathy 01/11/2021   Thoracic spondylosis with myelopathy 01/11/2021   Thoracic spinal stenosis 06/06/2017   Stenosis, spinal, thoracic 12/15/2014   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 09/22/2014   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/2011    PCP: Cherisse Cornell  REFERRING PROVIDER: Rawland Caddy, MD  REFERRING DIAG: thoracic disc disease with myelopathy, nerve pain, adhesive arachnoiditis  Rationale for Evaluation and Treatment: Rehabilitation  THERAPY DIAG:  Other low back pain  Other symptoms and signs involving the nervous system  Radiculopathy, lumbar region  Muscle weakness (generalized)  Repeated falls  Difficulty in walking, not  elsewhere classified  ONSET DATE:  Suddenly started having weakness 11 years ago, most recent episode of worsening October 2022, s/p right L4-5 laminotomy, microdiscectomy on 10/06/2021, now s/p L4-5 PLIF by Dr. Michale Age on 05/25/2022.  PERTINENT HISTORY:  Patient is a 43 y.o. male who presents to outpatient physical therapy with a referral for medical  diagnosis thoracic disc disease with myelopathy, nerve pain, adhesive arachnoiditis. This patient's chief complaints consist of disabling low back and R >L leg pain and weakness with activity, bowel and bladder urgency, incontinence, and retention, leading to the following functional deficits: difficulty with or unable to complete any activity that requires weight bearing, use of B LE, and/or balance including working, household and community mobility, walking, driving, going to family gatherings, avoiding incontinent episodes, playing with daughter, helping around the house, bed mobility, transfers. 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 Michale Age on 05/25/2022, adhesive arachnoiditis, nerve pain.  Patient denies hx of cancer, stroke, seizures, lung problems, heart problems, diabetes, unexplained weight loss, and osteoporosis.  SUBJECTIVE:                                                                                                                                                                                           SUBJECTIVE STATEMENT:     Patient states things have been going okay except he has pain at his right shin and over the top of his ankle today. He saw his PCP who wrote a PT order to address numbness/tingling in the right arm. He things the pain in his right ankle might be from walking more than usual today. He states he feels like his function has improved since PT taught him abdominal brace/valsalva with coordinated breathing while performing transfers and squats. HE  continues to perform HEP daily if his body will let him.   PAIN:  NPRS: 5/10 mostly in low back, right groin/hip, bilateral posterior thighs, right calf.   PRECAUTIONS: Fall  PATIENT GOALS: To get better, walk, drive again, climb stairs, play with my daughter, help around the house again  NEXT MD VISIT:   OBJECTIVE  SELF-REPORTED FUNCTION Patient Specific Functional Scale (PSFS)  Walking Around People: 3 Enjoying Life: 5 Driving: 0 Average: 1.6/10  STRENGTH (MMT) (last tested 01/22/24):  Hip  Flexion: R =  2-/5, L = 2-/5. Ankle (seated position) Dorsiflexion: R = 3/5, L = 2-/5. Plantarflexion: R = 2/5, L = 2/5. Eversion: R = 2/5, L = 1/5. Inversion: R =2+/5, L = 1/5.   FUNCTIONAL/BALANCE TESTS:   Five Time Sit to Stand (5TSTS):  Purpose: to assess LE functional strength and power for functional mobility.  Score: 15 seconds from 18.5 inch plinth heavy B UE support on BRW  Analysis: patient scored significantly worse than the 7.61.8 sec that is the norm for healthy males of his age but improved since last PT progress note. He also required heavy UE support while test norms assume UE crossed over chest. This demonstrates a significantly worse LE strength and power than would be expected for his age group and is actually worse than the norm for 80-22 year old community dwelling adults, which is 10.6 + 3.4 seconds. His score is 3 second better than was recorded last on 11/25/2023 which was 18 seconds with similar UE support. His score was better than his baseline of 23 seconds at the start of current episode of care.    6 Minute Walk Test:  Purpose: to assess function with community mobility Score: 343 feet with BRW.  Test concluded early with 2:23 min remaining due to low back, R hip, and nerve pain as well as progressive LE weakness (low back and right shin worst) Pain up to 6-7/10.   Analysis: patient ambulated significantly less than 1890 feet which is the norm for healthy  males of his age, and he ambulated just under 100 feet more than last progress note on 11/25/2023. He continues to be limited by low back pain, R hip pain, and neurologic pain and weakness in B LE with weight bearing activities. Pain was worst in low back and right shin.   TREATMENT Manual therapy: to reduce pain and tissue tension, improve range of motion, neuromodulation, in order to promote improved ability to complete functional activities.  Hooklying R hip caudal/posterior glide with belt, 3x45 seconds, grade IV  Therapeutic exercise: therapeutic exercises that incorporate ONE parameter at one or more areas of the body to centralize symptoms, develop strength and endurance, range of motion, and flexibility required for successful completion of functional activities.  MMT at ankles/hip to assess progress (see above)  Seated lumbar flexion roll out with theraball 1x ad lib per pt preference to help calm pain   Physical Performance Test or Measurement: a physical performance test or measurement with written report. and 5TSTS (see above)  Therapeutic activities: dynamic therapeutic activities incorporating MULTIPLE parameters or areas of the body designed to achieve improved functional performance.  Forward step up to 4 inch step with B UE support on TM bar in front of step 1x10 each side with seated break between sides CGA with chair behind PT    Pt required multimodal cuing for proper technique and to facilitate improved neuromuscular control, strength, range of motion, and functional ability resulting in improved performance and form.   PATIENT EDUCATION:  Education details: Exercise purpose/form. Self management techniques. POC.  Person educated: patient Education method: Explanation Education comprehension: verbalized understanding and needs further education  HOME EXERCISE PROGRAM: Verbally:  - seated lumbar flexion stretch with head down and one LE extended using  rollator, 1x10 each side.  - curl up with legs elevated, 5x15 seconds - sidelying open book 1x10 each side  Access Code: [email or web address] URL: [email or web address] Date: 12/18/2023 Prepared by: Alleen Isle  Exercises - Seated Diaphragmatic Breathing  -  3 reps - 1 minute practice time - Seated Pelvic Floor Elevators  - 3 x daily - 2 sets - 10 reps - Seated Ankle Plantar Flexion with Resistance Loop  - 1 x daily - 3 sets - 20 reps - Seated Ankle Dorsiflexion AROM  - 1 x daily - 3 sets - 10-30 reps - Seated Ankle Inversion AROM  - 1 x daily - 3 sets - 10-30 reps - Seated Ankle Eversion AROM  - 1 x daily - 3 sets - 10-30 reps - Sidelying Ankle Eversion Strengthening with Ankle Weight  - 1 x daily - 3 sets - 20 reps - Sidelying Ankle Inversion with Ankle Weight  - 1 x daily - 3 sets - 20 reps - Seated Ankle Dorsiflexion with Ankle Weight  - 1 x daily - 3 sets - 20 reps  ASSESSMENT:  CLINICAL IMPRESSION:     Patient has attended 70 physical therapy sessions since starting current episode of care on 03/12/2023. Patient has been working on ankle exercises since 12/18/2023 and MMT today suggests equivocal improvement in strength, likely because weakness is neurogenic and nerve function has not improved. In this PT's opinion, he has very low potential to improve his B ankle strength sufficiently to return to driving with foot pedals safely unless nerve function drastically improves. Patient's strength deficits seem very neurogenic, not strongly related to deconditioning. If his nerves were functioning sufficiently, his ankle strength would already be much better due to his continued efforts at rehab and functional mobility over the last several years. The only time this PT sees such a deficit with this level of motivation and effort in a patient is when there is neurologic damage. Patient also lacks reliable strength and motor control in hip flexion to ensure he will be able to safely control pedals  when driving. Strongly recommend patient pursue vehicle modifications to be able to control it with his upper extremities. Patient did improve his ambulation distance in 6 minute walk test by 100 feet since last PT progress note (and this is after walking a long distance at the hospital earlier today). He also demonstrated improved 5 Times Sit To Stand test by 3 seconds. However, he still cannot stand from a 19.5 inch plinth reliably without heavy B UE support on RW. He demonstrated one repetition of unsteady sit <> stand using one hand on the walker and one hand on the plinth. Patient continues to struggle with debilitating pain and intermittent psychomotor symptoms that appear related to flairs in his adhesive arachnoiditis that limit his ability to participate in PT. Spending more than a few minutes with standing exercises and attempting exercises that put him into lumbar extension greatly exacerbate his pain, often for days on end which cause him to miss PT visits and restrain him to the bed at home. Patient's condition is chronic and degenerative and continued PT is necessary to prevent/slow functional decline. He also has symptoms of R cervical radiculopathy and would benefit from adding this to his PT POC. Patient would benefit from continued management of limiting condition by skilled physical therapist to address remaining impairments and functional limitations to work towards stated goals and return to PLOF or maximal functional independence.    OBJECTIVE IMPAIRMENTS: Abnormal gait, decreased activity tolerance, decreased balance, decreased coordination, decreased endurance, decreased knowledge of condition, decreased knowledge of use of DME, decreased mobility, difficulty walking, decreased ROM, decreased strength, increased edema, impaired perceived functional ability, increased muscle spasms, impaired flexibility, impaired sensation, impaired tone,  improper body mechanics, postural dysfunction,  obesity, and pain.   ACTIVITY LIMITATIONS: carrying, lifting, bending, standing, squatting, stairs, transfers, bed mobility, continence, bathing, toileting, dressing, hygiene/grooming, locomotion level, and caring for others  PARTICIPATION LIMITATIONS: meal prep, cleaning, laundry, interpersonal relationship, driving, shopping, community activity, occupation, yard work, and  difficulty with or unable to complete any activity that requires weight bearing, use of B LE, and/or balance including working, household and community mobility, walking, driving, going to family gatherings, avoiding incontinent episodes, playing with daughter, helping around the house, bed mobility, transfers  PERSONAL FACTORS: Past/current experiences, Time since onset of injury/illness/exacerbation, and 3+ comorbidities:  3 thoracic spine surgeries, thoracic MRI notes 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 Michale Age on 05/25/2022, adhesive arachnoiditis, nerve pain are also affecting patient's functional outcome.   REHAB POTENTIAL: Fair due to severity and nature of condition.   CLINICAL DECISION MAKING: Evolving/moderate complexity  EVALUATION COMPLEXITY: Moderate   GOALS: Goals reviewed with patient? No  SHORT TERM GOALS: Target date: 01/09/2023. Target date updated to 05/28/2023 for all unmet goals on 03/05/2023.   Patient will be independent with initial home exercise program for self-management of symptoms. Baseline: Initial HEP to be provided at visit 2 as appropriate (12/26/22); Goal status: MET   LONG TERM GOALS: Target date: 03/20/2023. Target date updated to 08/26/23  for all unmet goals on  06/03/2023. Target date updated to 11/20/2023 for all  unmet goals on 08/28/2023. Target date updated to 02/17/2024  for all unmet goals on 11/25/2023. Target date updated to 04/14/2024 for all unmet goals on 01/22/2024.   Patient will be independent with a long-term home exercise program for self-management of symptoms.  Baseline: Initial HEP to be provided at visit 2 as appropriate (12/26/22); participating as able (01/31/2023); patient currently participating as tolerated (03/05/2023; 04/09/2023; 06/03/2023); participates daily in mat exercises and in weight bearing exercises as tolerated (10/08/2023; 11/25/2023); Tries to do daily but sometimes cannot when his body won't let him (01/22/2024);  Goal status: In-progress  2.  Patient will demonstrate improved FOTO by equal or greater than 10 points by visit #13 to demonstrate improvement in overall condition and self-reported functional ability.  Baseline: to be tested visit 2 as appropriate (12/26/22); 30 at visit #3 (01/02/2023); 46 at visit #13 (03/05/2023); 45 at visit #29 (06/03/2023); 49 at visit #50 (10/08/2023);  Goal status: MET  3.  Patient will demonstrate the ability to ambulate equal or greater than 600 feet with LRAD during the 6 minute walk to improve his household and community mobility.  Baseline: 34 feet with bari-RW (12/26/22); 200 feet with bari-RW (01/31/2023); 500 feet with BRW (03/05/2023); 400 feet with BRW (04/09/2023); 152 feet with BRW and W/C follow for safety (06/03/2023); 330 feet with BRW (10/08/2023); 245 feet with BRW and SBA for safety (11/25/2023); 343 feet with BRW and SBA for safety (01/22/2024);  Goal status: In progress  4.  Patient will complete 5 Time Sit To Stand Test from 19.5 inch surface or lower in equal or less than 15 seconds with no UE support to demonstrate improved transfer ability for improved household and toileting mobility.  Baseline: 23 seconds with heavy B UE support on RW from 19.5 inch plinth. Pain throbbing down  the right LE.  (12/26/22); 19 seconds with heavy B UE support on RW from 19.5 inch plinth (03/05/2023); 20 seconds with heavy B UE support on RRW from 18.5 inch plinth. Painful in back of B calves, thighs, and lower back (04/09/2023); 15 seconds with heavy B UE support on BRW from 18.5 inch plinth. Painful in back of B calves, thighs, and lower back (06/03/2023); 19 seconds with heavy B UE support on BRW from 18.5 inch plinth. Painful in back of B calves, thighs, and lower back (10/08/2023); 18 seconds with heavy B UE support on BRW from 18.5 inch plinth. Painful in back of B calves, thighs, and lower back (11/25/2023); 15seconds with heavy B UE support on BRW from 18.5 inch plinth. Painful in back of B calves, thighs, and lower back (01/22/2024);  Goal status: Progressing   5.  Patient will report worst pain equal or less than 3/10 with functional activities to improve his ability to complete basic household and community mobility. Baseline: up to 8/10 (12/26/22); reports 3/10 pain (01/31/2023); reports 4/10 pain (02/19/2023); up to 7/10 in the last 2 weeks (03/05/2023): up to  7/10 in the last two weeks (04/09/2023); up to 8/10 in the last 2 weeks (06/03/2023);  up to 9/10 over the last 2 weeks (10/08/2023); up to 7-8/10 in the last 2 weeks (11/25/2023); 7/10 in the last 2 weeks (01/22/2024);  Goal status: Ongoing  6.  Patient will demonstrate improvement in Patient Specific Functional Scale (PSFS) by equal or greater than 3/10 points to reflect clinically significant improvement in patient's most valued functional activities. Baseline: 1.7/10 for Walking Around [address], Enjoying Life, and Driving (29/56/21); 3.0/86 (11/25/2023); 2.7/10 (01/22/24);  Goal status: progressing    PLAN:  PT FREQUENCY: 1-2x/week  PT DURATION: 12 weeks  PLANNED INTERVENTIONS: Therapeutic exercises, Therapeutic activity, Neuromuscular re-education, Balance training, Gait training, Patient/Family education, Self Care, Joint  mobilization, Stair training, Orthotic/Fit training, DME instructions, Aquatic Therapy, Dry Needling, Electrical stimulation, Wheelchair mobility training, Spinal mobilization, Cryotherapy, Moist heat, Manual therapy, and Re-evaluation.  PLAN FOR NEXT SESSION:  update HEP as appropriate, neurodynamics, gait training, LE/core/functional strengthening, stretching, and balance as tolerated, education, manual therapy as needed. Pelvic floor.   Carilyn Charles. Artemio Larry, PT, DPT 01/22/24, 6:07 PM  [facility] [address] [place], [place] [address] P: [phone] I F: [phone]

## 2024-01-27 ENCOUNTER — Ambulatory Visit: Admitting: Physical Therapy

## 2024-01-29 ENCOUNTER — Ambulatory Visit: Admitting: Physical Therapy

## 2024-01-29 ENCOUNTER — Encounter: Payer: Self-pay | Admitting: Physical Therapy

## 2024-01-29 DIAGNOSIS — R29818 Other symptoms and signs involving the nervous system: Secondary | ICD-10-CM

## 2024-01-29 DIAGNOSIS — M5459 Other low back pain: Secondary | ICD-10-CM | POA: Diagnosis not present

## 2024-01-29 DIAGNOSIS — R296 Repeated falls: Secondary | ICD-10-CM

## 2024-01-29 DIAGNOSIS — M6281 Muscle weakness (generalized): Secondary | ICD-10-CM

## 2024-01-29 DIAGNOSIS — R262 Difficulty in walking, not elsewhere classified: Secondary | ICD-10-CM

## 2024-01-29 DIAGNOSIS — M5416 Radiculopathy, lumbar region: Secondary | ICD-10-CM

## 2024-01-29 NOTE — Therapy (Signed)
 OUTPATIENT PHYSICAL THERAPY TREATMENT   Patient Name: MEHMET SCALLY MRN: [id number] DOB:[birth year], 43 y.o., male Today's Date: 01/29/24   END OF SESSION:  PT End of Session - 01/29/24 1847     Visit Number 71    Number of Visits 93    Date for PT Re-Evaluation 04/15/24    Authorization Type MEDICARE PART B reporting period from 01/22/2024    Progress Note Due on Visit 80    PT Start Time 1819    PT Stop Time 1859    PT Time Calculation (min) 40 min    Activity Tolerance Patient limited by pain;Patient tolerated treatment well    Behavior During Therapy [facility] for tasks assessed/performed            Past Medical History:  Diagnosis Date   Bowel trouble    urgency   Medical history non-contributory    Urinary urgency    Past Surgical History:  Procedure Laterality Date   BACK SURGERY  2010   CIRCUMCISION     LUMBAR LAMINECTOMY/DECOMPRESSION MICRODISCECTOMY  07/20/2011   Procedure: LUMBAR LAMINECTOMY/DECOMPRESSION MICRODISCECTOMY;  Surgeon: Pasty Bongo;  Location: [facility];  Service: Neurosurgery;  Laterality: N/A;  right thoracotomy with thoracic eight-nine discectomy and fusion   LUMBAR LAMINECTOMY/DECOMPRESSION MICRODISCECTOMY Right 10/06/2021   Procedure: Right Lumbar Four-Five Microdiscectomy, Right Lumbar Five-Sacal One Foraminotomy;  Surgeon: Audie Bleacher, MD;  Location: [facility];  Service: Neurosurgery;  Laterality: Right;  3C/RM 21   THORACIC DISCECTOMY  07/16/2012   Procedure: THORACIC DISCECTOMY;  Surgeon: Pasty Bongo, MD;  Location: [facility];  Service: Neurosurgery;  Laterality: Right;  RIGHT Thoracic seven-eight  thoracic diskectomy via thoracotomy by dr Sherene Dilling   THORACIC DISCECTOMY N/A 12/15/2014   Procedure: THORACIC SEVEN TO THORACIC NINE Laminectomy ;  Surgeon: Audie Bleacher, MD;  Location: [facility];  Service: Neurosurgery;  Laterality: N/A;   THORACIC DISCECTOMY N/A 06/06/2017   Procedure: LAMINECTOMY THORACIC NINE-TEN;  Surgeon: Audie Bleacher,  MD;  Location: [facility];  Service: Neurosurgery;  Laterality: N/A;  LAMINECTOMY THORACIC NINE-TEN   THORACOTOMY  07/20/2011   Procedure: THORACOTOMY OPEN FOR SPINE SURGERY;  Surgeon: Gilmore Lager, MD;  Location: [facility];  Service: Vascular;  Laterality: N/A;   THORACOTOMY  07/16/2012   Procedure: THORACOTOMY OPEN FOR SPINE SURGERY;  Surgeon: Bartley Lightning, MD;  Location: [facility];  Service: Thoracic;  Laterality: N/A;   Patient Active Problem List   Diagnosis Date Noted   Spasticity 09/25/2023   Nerve pain 12/05/2022   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   S/P lumbar spinal fusion 05/25/2022   Synovial cyst of lumbar facet joint 05/25/2022   HNP (herniated nucleus pulposus), lumbar 10/06/2021   Hand pain 06/28/2021   Lumbar facet arthropathy 01/11/2021   Thoracic spondylosis with myelopathy 01/11/2021   Thoracic spinal stenosis 06/06/2017   Stenosis, spinal, thoracic 12/15/2014   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 09/22/2014   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/2011    PCP: Cherisse Cornell  REFERRING PROVIDER: Rawland Caddy, MD  REFERRING DIAG: thoracic disc disease with myelopathy, nerve pain, adhesive arachnoiditis  Rationale for Evaluation and Treatment: Rehabilitation  THERAPY DIAG:  Other low back pain  Other symptoms and signs involving the nervous system  Radiculopathy, lumbar region  Muscle weakness (generalized)  Repeated falls  Difficulty in walking, not elsewhere classified  ONSET DATE:  Suddenly started having weakness 11 years ago, most recent episode of worsening October 2022, s/p  right L4-5 laminotomy, microdiscectomy on 10/06/2021, now s/p L4-5 PLIF by Dr. Michale Age on 05/25/2022.  PERTINENT HISTORY:  Patient is a 43 y.o. male who presents to outpatient physical therapy with a referral for medical diagnosis thoracic disc disease with myelopathy, nerve pain, adhesive arachnoiditis. This patient's chief  complaints consist of disabling low back and R >L leg pain and weakness with activity, bowel and bladder urgency, incontinence, and retention, leading to the following functional deficits: difficulty with or unable to complete any activity that requires weight bearing, use of B LE, and/or balance including working, household and community mobility, walking, driving, going to family gatherings, avoiding incontinent episodes, playing with daughter, helping around the house, bed mobility, transfers. 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 Michale Age on 05/25/2022, adhesive arachnoiditis, nerve pain.  Patient denies hx of cancer, stroke, seizures, lung problems, heart problems, diabetes, unexplained weight loss, and osteoporosis.  SUBJECTIVE:                                                                                                                                                                                           SUBJECTIVE STATEMENT:     Patient states he is okay today. He went to a graduation on Saturday and there was a little walk and he did not take his motorized wheelchair. He went to a cookout on Sunday and when he awoke on Monday he could not put any pressure on his right LE (it felt like it would buckle and he had sharp pain in his right lower back/hip region). It is feeling better now. He felt okay after his last PT session with the normal soreness. He got the results from his cervical spine xray back that showed straightening of the cervical spine curve.   PAIN:  NPRS:  3/10 mostly in low back, right groin/hip, bilateral posterior thighs, right calf.   PRECAUTIONS: Fall  PATIENT GOALS: To get better, walk, drive again, climb stairs, play with my daughter, help around the house again  NEXT MD VISIT:   OBJECTIVE   TREATMENT Manual therapy: to reduce pain and tissue tension, improve range of motion, neuromodulation, in order to promote improved ability to complete functional activities.  Hooklying R hip caudal/posterior glide with belt, 3x45 seconds, grade IV  Therapeutic exercise:  therapeutic exercises that incorporate ONE parameter at one or more areas of the body to centralize symptoms, develop strength and endurance, range of motion, and flexibility required for successful completion of functional activities.  Seated LAQ on elevated plinth with AW+TB loop 3x12 each side with 17.5#AW + BlueTB loop anchored at base of plinth Difficulty with reps on R LE towards end of sets Pulling down back of R LE but tolerable  Superset:  Seated overhead pull down (B shoulder extension from ~120 degrees flexion to bar on thighs) with cable bar to improve abdominal strength 1x20 at 35#  painful   Seated CHEST press to improve trunk extensor strength 1x15 with 23# Barbell  Seated lumbar flexion roll out with theraball 1x ad lib per pt preference to help calm pain   Neuromuscular Re-education: a technique or exercise performed with the goal of improving the level of communication between the body and the brain, such as for balance, motor control, muscle activation patterns, coordination, desensitization, quality of muscle contraction, proprioception, and/or kinesthetic sense needed for successful and safe completion of functional activities.    Seated LE extension/flexion with foot on basketball  R:        3x10 L:         3x10  Therapeutic activities: dynamic therapeutic activities incorporating MULTIPLE parameters or areas of the body designed to achieve  improved functional performance.  Sit <> Stand with B UE support on RW 3x5    Pt required multimodal cuing for proper technique and to facilitate improved neuromuscular control, strength, range of motion, and functional ability resulting in improved performance and form.   PATIENT EDUCATION:  Education details: Exercise purpose/form. Self management techniques. POC.  Person educated: patient Education method: Explanation Education comprehension: verbalized understanding and needs further education  HOME EXERCISE PROGRAM: Verbally:  - seated lumbar flexion stretch with head down and one LE extended using rollator, 1x10 each side.  - curl up with legs elevated, 5x15 seconds - sidelying open book 1x10 each side  Access Code: [email or web address] URL: [email or web address] Date: 12/18/2023 Prepared by: Alleen Isle  Exercises - Seated Diaphragmatic Breathing  - 3 reps - 1 minute practice time - Seated Pelvic Floor Elevators  - 3 x daily - 2 sets - 10 reps - Seated Ankle Plantar Flexion with Resistance Loop  - 1 x daily - 3 sets - 20 reps - Seated Ankle Dorsiflexion AROM  - 1 x daily - 3 sets - 10-30 reps - Seated Ankle Inversion AROM  - 1 x daily - 3 sets - 10-30 reps - Seated Ankle Eversion AROM  - 1 x daily - 3 sets - 10-30 reps - Sidelying Ankle Eversion Strengthening with Ankle Weight  - 1 x daily - 3 sets - 20 reps - Sidelying Ankle Inversion with Ankle Weight  - 1 x daily - 3 sets - 20 reps - Seated Ankle Dorsiflexion with Ankle Weight  - 1 x daily - 3 sets - 20 reps  ASSESSMENT:  CLINICAL IMPRESSION:     Patient missed his first PT appointment this week after his right LE flared up following a longer walk over the weekend. He was better today but still more limited than last PT session in his exercise tolerance. Continued working on historically well tolerated exercises. Plan to try sled push next session. Patient would benefit from continued management of limiting  condition by skilled physical therapist to address remaining impairments and functional limitations to work towards stated goals and return to [facility]  or maximal functional independence.    OBJECTIVE IMPAIRMENTS: Abnormal gait, decreased activity tolerance, decreased balance, decreased coordination, decreased endurance, decreased knowledge of condition, decreased knowledge of use of DME, decreased mobility, difficulty walking, decreased ROM, decreased strength, increased edema, impaired perceived functional ability, increased muscle spasms, impaired flexibility, impaired sensation, impaired tone, improper body mechanics, postural dysfunction, obesity, and pain.   ACTIVITY LIMITATIONS: carrying, lifting, bending, standing, squatting, stairs, transfers, bed mobility, continence, bathing, toileting, dressing, hygiene/grooming, locomotion level, and caring for others  PARTICIPATION LIMITATIONS: meal prep, cleaning, laundry, interpersonal relationship, driving, shopping, community activity, occupation, yard work, and  difficulty with or unable to complete any activity that requires weight bearing, use of B LE, and/or balance including working, household and community mobility, walking, driving, going to family gatherings, avoiding incontinent episodes, playing with daughter, helping around the house, bed mobility, transfers  PERSONAL FACTORS: Past/current experiences, Time since onset of injury/illness/exacerbation, and 3+ comorbidities:  3 thoracic spine surgeries, thoracic MRI notes 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 Michale Age on 05/25/2022, adhesive arachnoiditis, nerve pain are also  affecting patient's functional outcome.   REHAB POTENTIAL: Fair due to severity and nature of condition.   CLINICAL DECISION MAKING: Evolving/moderate complexity  EVALUATION COMPLEXITY: Moderate   GOALS: Goals reviewed with patient? No  SHORT TERM GOALS: Target date: 01/09/2023. Target date updated to 05/28/2023 for all unmet goals on 03/05/2023.   Patient will be independent with initial home exercise program for self-management of symptoms. Baseline: Initial HEP to be provided at visit 2 as appropriate (12/26/22); Goal status: MET   LONG TERM GOALS: Target date: 03/20/2023. Target date updated to 08/26/23  for all unmet goals on 06/03/2023. Target date updated to 11/20/2023 for all  unmet goals on 08/28/2023. Target date updated to 02/17/2024  for all unmet goals on 11/25/2023. Target date updated to 04/14/2024 for all unmet goals on 01/22/2024.   Patient will be independent with a long-term home exercise program for self-management of symptoms.  Baseline: Initial HEP to be provided at visit 2 as appropriate (12/26/22); participating as able (01/31/2023); patient currently participating as tolerated (03/05/2023; 04/09/2023; 06/03/2023); participates daily in mat exercises and in weight bearing exercises as tolerated (10/08/2023; 11/25/2023); Tries to do daily but sometimes cannot when his body won't let him (01/22/2024);  Goal status: In-progress  2.  Patient will demonstrate improved FOTO by equal or greater than 10 points by visit #13 to demonstrate improvement in overall condition and self-reported functional ability.  Baseline: to be tested visit 2 as appropriate (12/26/22); 30 at visit #3 (01/02/2023); 46 at visit #13 (03/05/2023); 45 at visit #29 (06/03/2023); 49 at visit #50 (10/08/2023);  Goal status: MET  3.  Patient will demonstrate the ability to ambulate equal or greater than 600 feet with LRAD during the 6 minute walk to improve his household and community mobility.  Baseline: 34 feet with  bari-RW (12/26/22); 200 feet with bari-RW (01/31/2023); 500 feet with BRW (03/05/2023); 400 feet with BRW (04/09/2023); 152 feet with BRW and W/C follow for safety (06/03/2023); 330 feet with BRW (10/08/2023); 245 feet with BRW and SBA for safety (11/25/2023); 343 feet with BRW and SBA for safety (01/22/2024);  Goal status: In progress  4.  Patient will  complete 5 Time Sit To Stand Test from 19.5 inch surface or lower in equal or less than 15 seconds with no UE support to demonstrate improved transfer ability for improved household and toileting mobility.  Baseline: 23 seconds with heavy B UE support on RW from 19.5 inch plinth. Pain throbbing down the right LE.  (12/26/22); 19 seconds with heavy B UE support on RW from 19.5 inch plinth (03/05/2023); 20 seconds with heavy B UE support on RRW from 18.5 inch plinth. Painful in back of B calves, thighs, and lower back (04/09/2023); 15 seconds with heavy B UE support on BRW from 18.5 inch plinth. Painful in back of B calves, thighs, and lower back (06/03/2023); 19 seconds with heavy B UE support on BRW from 18.5 inch plinth. Painful in back of B calves, thighs, and lower back (10/08/2023); 18 seconds with heavy B UE support on BRW from 18.5 inch plinth. Painful in back of B calves, thighs, and lower back (11/25/2023); 15seconds with heavy B UE support on BRW from 18.5 inch plinth. Painful in back of B calves, thighs, and lower back (01/22/2024);  Goal status: Progressing   5.  Patient will report worst pain equal or less than 3/10 with functional activities to improve his ability to complete basic household and community mobility. Baseline: up to 8/10 (12/26/22); reports 3/10 pain (01/31/2023); reports 4/10 pain (02/19/2023); up to 7/10 in the last 2 weeks (03/05/2023): up to  7/10 in the last two weeks (04/09/2023); up to 8/10 in the last 2 weeks (06/03/2023);  up to 9/10 over the last 2 weeks (10/08/2023); up to 7-8/10 in the last 2 weeks (11/25/2023); 7/10 in the last 2 weeks  (01/22/2024);  Goal status: Ongoing  6.  Patient will demonstrate improvement in Patient Specific Functional Scale (PSFS) by equal or greater than 3/10 points to reflect clinically significant improvement in patient's most valued functional activities. Baseline: 1.7/10 for Walking Around [address], Enjoying Life, and Driving (24/40/10); 2.7/25 (11/25/2023); 2.7/10 (01/22/24);  Goal status: progressing    PLAN:  PT FREQUENCY: 1-2x/week  PT DURATION: 12 weeks  PLANNED INTERVENTIONS: Therapeutic exercises, Therapeutic activity, Neuromuscular re-education, Balance training, Gait training, Patient/Family education, Self Care, Joint mobilization, Stair training, Orthotic/Fit training, DME instructions, Aquatic Therapy, Dry Needling, Electrical stimulation, Wheelchair mobility training, Spinal mobilization, Cryotherapy, Moist heat, Manual therapy, and Re-evaluation.  PLAN FOR NEXT SESSION:  update HEP as appropriate, neurodynamics, gait training, LE/core/functional strengthening, stretching, and balance as tolerated, education, manual therapy as needed. Pelvic floor.   Carilyn Charles. Artemio Larry, PT, DPT 01/29/24, 8:19 PM  [facility] [address] [place], [place] [address] P: [phone] I F: [phone]

## 2024-02-04 ENCOUNTER — Encounter: Payer: Self-pay | Admitting: Physical Therapy

## 2024-02-04 ENCOUNTER — Ambulatory Visit: Admitting: Physical Therapy

## 2024-02-04 DIAGNOSIS — R296 Repeated falls: Secondary | ICD-10-CM

## 2024-02-04 DIAGNOSIS — M6281 Muscle weakness (generalized): Secondary | ICD-10-CM

## 2024-02-04 DIAGNOSIS — M5416 Radiculopathy, lumbar region: Secondary | ICD-10-CM

## 2024-02-04 DIAGNOSIS — R262 Difficulty in walking, not elsewhere classified: Secondary | ICD-10-CM

## 2024-02-04 DIAGNOSIS — M5459 Other low back pain: Secondary | ICD-10-CM

## 2024-02-04 DIAGNOSIS — R29818 Other symptoms and signs involving the nervous system: Secondary | ICD-10-CM

## 2024-02-04 NOTE — Therapy (Signed)
 OUTPATIENT PHYSICAL THERAPY TREATMENT   Patient Name: Travis Fitzpatrick MRN: [id number] DOB:19-Oct-1980, 43 y.o., male Today's Date: 02/04/24   END OF SESSION:  PT End of Session - 02/04/24 1859     Visit Number 72    Number of Visits 93    Date for PT Re-Evaluation 04/15/24    Authorization Type MEDICARE PART B reporting period from 01/22/2024    Progress Note Due on Visit 80    PT Start Time 1822    PT Stop Time 1904    PT Time Calculation (min) 42 min    Activity Tolerance Patient limited by pain;Patient tolerated treatment well    Behavior During Therapy [facility] for tasks assessed/performed             Past Medical History:  Diagnosis Date   Bowel trouble    urgency   Medical history non-contributory    Urinary urgency    Past Surgical History:  Procedure Laterality Date   BACK SURGERY  2010   CIRCUMCISION     LUMBAR LAMINECTOMY/DECOMPRESSION MICRODISCECTOMY  07/20/2011   Procedure: LUMBAR LAMINECTOMY/DECOMPRESSION MICRODISCECTOMY;  Surgeon: Rockey LITTIE Peru;  Location: [facility];  Service: Neurosurgery;  Laterality: N/A;  right thoracotomy with thoracic eight-nine discectomy and fusion   LUMBAR LAMINECTOMY/DECOMPRESSION MICRODISCECTOMY Right 10/06/2021   Procedure: Right Lumbar Four-Five Microdiscectomy, Right Lumbar Five-Sacal One Foraminotomy;  Surgeon: Peru Rockey, MD;  Location: [facility];  Service: Neurosurgery;  Laterality: Right;  3C/RM 21   THORACIC DISCECTOMY  07/16/2012   Procedure: THORACIC DISCECTOMY;  Surgeon: Rockey LITTIE Peru, MD;  Location: [facility];  Service: Neurosurgery;  Laterality: Right;  RIGHT Thoracic seven-eight  thoracic diskectomy via thoracotomy by dr lucas   THORACIC DISCECTOMY N/A 12/15/2014   Procedure: THORACIC SEVEN TO THORACIC NINE Laminectomy ;  Surgeon: Rockey Peru, MD;  Location: [facility];  Service: Neurosurgery;  Laterality: N/A;   THORACIC DISCECTOMY N/A 06/06/2017   Procedure: LAMINECTOMY THORACIC NINE-TEN;  Surgeon: [place] Rockey, MD;  Location: [facility];  Service: Neurosurgery;  Laterality: N/A;  LAMINECTOMY THORACIC NINE-TEN   THORACOTOMY  07/20/2011   Procedure: THORACOTOMY OPEN FOR SPINE SURGERY;  Surgeon: JONETTA Belvie Nam, MD;  Location: [facility];  Service: Vascular;  Laterality: N/A;   THORACOTOMY  07/16/2012   Procedure: THORACOTOMY OPEN FOR SPINE SURGERY;  Surgeon: Dorise MARLA lucas, MD;  Location: [facility];  Service: Thoracic;  Laterality: N/A;   Patient Active Problem List   Diagnosis Date Noted   Spasticity 09/25/2023   Nerve pain 12/05/2022   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   S/P lumbar spinal fusion 05/25/2022   Synovial cyst of lumbar facet joint 05/25/2022   HNP (herniated nucleus pulposus), lumbar 10/06/2021   Hand pain 06/28/2021   Lumbar facet arthropathy 01/11/2021   Thoracic spondylosis with myelopathy 01/11/2021   Thoracic spinal stenosis 06/06/2017   Stenosis, spinal, thoracic 12/15/2014   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 09/22/2014   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/2011    PCP: Maryl Sayres  REFERRING PROVIDER: Babs Arthea DASEN, MD  REFERRING DIAG: thoracic disc disease with myelopathy, nerve pain, adhesive arachnoiditis  Rationale for Evaluation and Treatment: Rehabilitation  THERAPY DIAG:  Other low back pain  Other symptoms and signs involving the nervous system  Radiculopathy, lumbar region  Muscle weakness (generalized)  Repeated falls  Difficulty in walking, not elsewhere classified  ONSET DATE:  Suddenly started having weakness 11 years ago, most recent episode of worsening October 2022,  s/p right L4-5 laminotomy, microdiscectomy on 10/06/2021, now s/p L4-5 PLIF by Dr. Gillie on 05/25/2022.  PERTINENT HISTORY:  Patient is a 43 y.o. male who presents to outpatient physical therapy with a referral for medical diagnosis thoracic disc disease with myelopathy, nerve pain, adhesive arachnoiditis. This patient's  chief complaints consist of disabling low back and R >L leg pain and weakness with activity, bowel and bladder urgency, incontinence, and retention, leading to the following functional deficits: difficulty with or unable to complete any activity that requires weight bearing, use of B LE, and/or balance including working, household and community mobility, walking, driving, going to family gatherings, avoiding incontinent episodes, playing with daughter, helping around the house, bed mobility, transfers. 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 Gillie on 05/25/2022, adhesive arachnoiditis, nerve pain.  Patient denies hx of cancer, stroke, seizures, lung problems, heart problems, diabetes, unexplained weight loss, and osteoporosis.  SUBJECTIVE:                                                                                                                                                                                           SUBJECTIVE STATEMENT:     Patient states he is okay today. He went to a graduation on Saturday and there was a little walk and he did not take his motorized wheelchair. He went to a cookout on Sunday and when he awoke on Monday he could not put any pressure on his right LE (it felt like it would buckle and he had sharp pain in his right lower back/hip region). It is feeling better now. He felt okay after his last PT session with the normal soreness. He got the results from his cervical spine xray back that showed straightening of the cervical spine curve.   PAIN:   NPRS: 3/10 mostly in low back, right groin/hip, bilateral posterior thighs, right calf.   PRECAUTIONS: Fall  PATIENT GOALS: To get better, walk, drive again, climb stairs, play with my daughter, help around the house again  NEXT MD VISIT:   OBJECTIVE   TREATMENT Manual therapy: to reduce pain and tissue tension, improve range of motion, neuromodulation, in order to promote improved ability to complete functional activities.  Hooklying R hip caudal/posterior glide with belt, 3x45 seconds, grade IV  Therapeutic  exercise: therapeutic exercises that incorporate ONE parameter at one or more areas of the body to centralize symptoms, develop strength and endurance, range of motion, and flexibility required for successful completion of functional activities.  Seated LAQ on elevated plinth with AW+TB loop R:  3x8 (max) with 17.5#AW + BlueTB loop anchored at base of plinth L:  3x12 each side with 17.5#AW + BlueTB loop anchored at base of plinth  Difficulty with reps on R LE towards end of sets Pulling down back of R LE but tolerable  Seated lumbar flexion roll out with theraball 1x ad lib per pt preference to help calm pain after sled push  Education on communication with referring clinician including recommendation that patient follow up with Dr. Babs.    Therapeutic activities: dynamic therapeutic activities incorporating MULTIPLE parameters or areas of the body designed to achieve improved functional performance.  Sled push 2 sets of 2x30 feet with sled only (75#) PT helping turn sled Seated break between each set Pt attempted 3rd set but R LE too painful   Pt required multimodal cuing for proper technique and to facilitate improved neuromuscular control, strength, range of motion, and functional ability resulting in improved performance and form.   PATIENT EDUCATION:  Education details: Exercise purpose/form. Self management techniques. POC.  Person educated:  patient Education method: Explanation Education comprehension: verbalized understanding and needs further education  HOME EXERCISE PROGRAM: Verbally:  - seated lumbar flexion stretch with head down and one LE extended using rollator, 1x10 each side.  - curl up with legs elevated, 5x15 seconds - sidelying open book 1x10 each side  Access Code: [email or web address] URL: [email or web address] Date: 12/18/2023 Prepared by: Camie Cleverly  Exercises - Seated Diaphragmatic Breathing  - 3 reps - 1 minute practice time - Seated Pelvic Floor Elevators  - 3 x daily - 2 sets - 10 reps - Seated Ankle Plantar Flexion with Resistance Loop  - 1 x daily - 3 sets - 20 reps - Seated Ankle Dorsiflexion AROM  - 1 x daily - 3 sets - 10-30 reps - Seated Ankle Inversion AROM  - 1 x daily - 3 sets - 10-30 reps - Seated Ankle Eversion AROM  - 1 x daily - 3 sets - 10-30 reps - Sidelying Ankle Eversion Strengthening with Ankle Weight  - 1 x daily - 3 sets - 20 reps - Sidelying Ankle Inversion with Ankle Weight  - 1 x daily - 3 sets - 20 reps - Seated Ankle Dorsiflexion with Ankle Weight  - 1 x daily - 3 sets - 20 reps  ASSESSMENT:  CLINICAL IMPRESSION:     Patient tried pushing sled for the first time today. He was able to do it but limited by increasing symptoms in the right LE that ultimately stopped him from performing more. He was highly motivated to push the sled. Exercises after were limited to those that are less irritating. Plan to assess tolerance at next session after he is able to go through recovery from the exercises. Patient would benefit from continued management of limiting condition by skilled physical therapist to address remaining impairments and functional limitations to work towards stated goals and return to PLOF or maximal functional independence.     OBJECTIVE IMPAIRMENTS: Abnormal gait, decreased activity tolerance, decreased balance, decreased coordination, decreased endurance,  decreased knowledge of condition, decreased knowledge of use of DME, decreased mobility, difficulty walking, decreased ROM, decreased strength, increased edema, impaired perceived functional ability, increased muscle spasms, impaired flexibility, impaired sensation, impaired  tone, improper body mechanics, postural dysfunction, obesity, and pain.   ACTIVITY LIMITATIONS: carrying, lifting, bending, standing, squatting, stairs, transfers, bed mobility, continence, bathing, toileting, dressing, hygiene/grooming, locomotion level, and caring for others  PARTICIPATION LIMITATIONS: meal prep, cleaning, laundry, interpersonal relationship, driving, shopping, community activity, occupation, yard work, and  difficulty with or unable to complete any activity that requires weight bearing, use of B LE, and/or balance including working, household and community mobility, walking, driving, going to family gatherings, avoiding incontinent episodes, playing with daughter, helping around the house, bed mobility, transfers  PERSONAL FACTORS: Past/current experiences, Time since onset of injury/illness/exacerbation, and 3+ comorbidities:  3 thoracic spine surgeries, thoracic MRI notes 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 Gillie on 05/25/2022, adhesive arachnoiditis, nerve pain are also affecting patient's functional outcome.   REHAB POTENTIAL: Fair due to severity and nature of condition.   CLINICAL DECISION MAKING: Evolving/moderate complexity  EVALUATION COMPLEXITY: Moderate   GOALS: Goals reviewed with patient? No  SHORT TERM GOALS: Target date: 01/09/2023. Target date updated to 05/28/2023 for  all unmet goals on 03/05/2023.   Patient will be independent with initial home exercise program for self-management of symptoms. Baseline: Initial HEP to be provided at visit 2 as appropriate (12/26/22); Goal status: MET   LONG TERM GOALS: Target date: 03/20/2023. Target date updated to 08/26/23  for all unmet goals on 06/03/2023. Target date updated to 11/20/2023 for all  unmet goals on 08/28/2023. Target date updated to 02/17/2024  for all unmet goals on 11/25/2023. Target date updated to 04/14/2024 for all unmet goals on 01/22/2024.   Patient will be independent with a long-term home exercise program for self-management of symptoms.  Baseline: Initial HEP to be provided at visit 2 as appropriate (12/26/22); participating as able (01/31/2023); patient currently participating as tolerated (03/05/2023; 04/09/2023; 06/03/2023); participates daily in mat exercises and in weight bearing exercises as tolerated (10/08/2023; 11/25/2023); Tries to do daily but sometimes cannot when his body won't let him (01/22/2024);  Goal status: In-progress  2.  Patient will demonstrate improved FOTO by equal or greater than 10 points by visit #13 to demonstrate improvement in overall condition and self-reported functional ability.  Baseline: to be tested visit 2 as appropriate (12/26/22); 30 at visit #3 (01/02/2023); 46 at visit #13 (03/05/2023); 45 at visit #29 (06/03/2023); 49 at visit #50 (10/08/2023);  Goal status: MET  3.  Patient will demonstrate the ability to ambulate equal or greater than 600 feet with LRAD during the 6 minute walk to improve his household and community mobility.  Baseline: 34 feet with bari-RW (12/26/22); 200 feet with bari-RW (01/31/2023); 500 feet with BRW (03/05/2023); 400 feet with BRW (04/09/2023); 152 feet with BRW and W/C follow for safety (06/03/2023); 330 feet with BRW (10/08/2023); 245 feet with BRW and SBA for safety (11/25/2023); 343 feet with BRW and SBA for safety (01/22/2024);  Goal status: In  progress  4.  Patient will complete 5 Time Sit To Stand Test from 19.5 inch surface or lower in equal or less than 15 seconds with no UE support to demonstrate improved transfer ability for improved household and toileting mobility.  Baseline: 23 seconds with heavy B UE support on RW from 19.5 inch plinth. Pain throbbing  down the right LE.  (12/26/22); 19 seconds with heavy B UE support on RW from 19.5 inch plinth (03/05/2023); 20 seconds with heavy B UE support on RRW from 18.5 inch plinth. Painful in back of B calves, thighs, and lower back (04/09/2023); 15 seconds with heavy B UE support on BRW from 18.5 inch plinth. Painful in back of B calves, thighs, and lower back (06/03/2023); 19 seconds with heavy B UE support on BRW from 18.5 inch plinth. Painful in back of B calves, thighs, and lower back (10/08/2023); 18 seconds with heavy B UE support on BRW from 18.5 inch plinth. Painful in back of B calves, thighs, and lower back (11/25/2023); 15seconds with heavy B UE support on BRW from 18.5 inch plinth. Painful in back of B calves, thighs, and lower back (01/22/2024);  Goal status: Progressing   5.  Patient will report worst pain equal or less than 3/10 with functional activities to improve his ability to complete basic household and community mobility. Baseline: up to 8/10 (12/26/22); reports 3/10 pain (01/31/2023); reports 4/10 pain (02/19/2023); up to 7/10 in the last 2 weeks (03/05/2023): up to  7/10 in the last two weeks (04/09/2023); up to 8/10 in the last 2 weeks (06/03/2023);  up to 9/10 over the last 2 weeks (10/08/2023); up to 7-8/10 in the last 2 weeks (11/25/2023); 7/10 in the last 2 weeks (01/22/2024);  Goal status: Ongoing  6.  Patient will demonstrate improvement in Patient Specific Functional Scale (PSFS) by equal or greater than 3/10 points to reflect clinically significant improvement in patient's most valued functional activities. Baseline: 1.7/10 for Walking Around [address], Enjoying Life, and  Driving (97/74/74); 7.2/89 (11/25/2023); 2.7/10 (01/22/24);  Goal status: progressing    PLAN:  PT FREQUENCY: 1-2x/week  PT DURATION: 12 weeks  PLANNED INTERVENTIONS: Therapeutic exercises, Therapeutic activity, Neuromuscular re-education, Balance training, Gait training, Patient/Family education, Self Care, Joint mobilization, Stair training, Orthotic/Fit training, DME instructions, Aquatic Therapy, Dry Needling, Electrical stimulation, Wheelchair mobility training, Spinal mobilization, Cryotherapy, Moist heat, Manual therapy, and Re-evaluation.  PLAN FOR NEXT SESSION:  update HEP as appropriate, neurodynamics, gait training, LE/core/functional strengthening, stretching, and balance as tolerated, education, manual therapy as needed. Pelvic floor.   Camie SAUNDERS. Juli, PT, DPT 02/04/24, 7:41 PM  [facility] [address] [place], [place] [address] P: [phone] I F: [phone]

## 2024-02-06 ENCOUNTER — Ambulatory Visit: Admitting: Physical Therapy

## 2024-02-06 ENCOUNTER — Encounter: Payer: Self-pay | Admitting: Physical Therapy

## 2024-02-06 DIAGNOSIS — R262 Difficulty in walking, not elsewhere classified: Secondary | ICD-10-CM

## 2024-02-06 DIAGNOSIS — M6281 Muscle weakness (generalized): Secondary | ICD-10-CM

## 2024-02-06 DIAGNOSIS — R296 Repeated falls: Secondary | ICD-10-CM

## 2024-02-06 DIAGNOSIS — M5459 Other low back pain: Secondary | ICD-10-CM | POA: Diagnosis not present

## 2024-02-06 DIAGNOSIS — M5416 Radiculopathy, lumbar region: Secondary | ICD-10-CM

## 2024-02-06 DIAGNOSIS — R29818 Other symptoms and signs involving the nervous system: Secondary | ICD-10-CM

## 2024-02-06 NOTE — Therapy (Signed)
 OUTPATIENT PHYSICAL THERAPY TREATMENT   Patient Name: Travis Fitzpatrick MRN: [id number] DOB:[birth year], 43 y.o., male Today's Date: 02/06/24   END OF SESSION:  PT End of Session - 02/06/24 1808     Visit Number 73    Number of Visits 93    Date for PT Re-Evaluation 04/15/24    Authorization Type MEDICARE PART B reporting period from 01/22/2024    Progress Note Due on Visit 80    PT Start Time 1734    PT Stop Time 1812    PT Time Calculation (min) 38 min    Activity Tolerance Patient limited by pain;Patient tolerated treatment well    Behavior During Therapy [facility] for tasks assessed/performed              Past Medical History:  Diagnosis Date   Bowel trouble    urgency   Medical history non-contributory    Urinary urgency    Past Surgical History:  Procedure Laterality Date   BACK SURGERY  2010   CIRCUMCISION     LUMBAR LAMINECTOMY/DECOMPRESSION MICRODISCECTOMY  07/20/2011   Procedure: LUMBAR LAMINECTOMY/DECOMPRESSION MICRODISCECTOMY;  Surgeon: Rockey LITTIE Peru;  Location: [facility];  Service: Neurosurgery;  Laterality: N/A;  right thoracotomy with thoracic eight-nine discectomy and fusion   LUMBAR LAMINECTOMY/DECOMPRESSION MICRODISCECTOMY Right 10/06/2021   Procedure: Right Lumbar Four-Five Microdiscectomy, Right Lumbar Five-Sacal One Foraminotomy;  Surgeon: Peru Rockey, MD;  Location: [facility];  Service: Neurosurgery;  Laterality: Right;  3C/RM 21   THORACIC DISCECTOMY  07/16/2012   Procedure: THORACIC DISCECTOMY;  Surgeon: Rockey LITTIE Peru, MD;  Location: [facility];  Service: Neurosurgery;  Laterality: Right;  RIGHT Thoracic seven-eight  thoracic diskectomy via thoracotomy by dr lucas   THORACIC DISCECTOMY N/A 12/15/2014   Procedure: THORACIC SEVEN TO THORACIC NINE Laminectomy ;  Surgeon: Rockey Peru, MD;  Location: [facility];  Service: Neurosurgery;  Laterality: N/A;   THORACIC DISCECTOMY N/A 06/06/2017   Procedure: LAMINECTOMY THORACIC NINE-TEN;  Surgeon: [place] Rockey, MD;  Location: [facility];  Service: Neurosurgery;  Laterality: N/A;  LAMINECTOMY THORACIC NINE-TEN   THORACOTOMY  07/20/2011   Procedure: THORACOTOMY OPEN FOR SPINE SURGERY;  Surgeon: JONETTA Belvie Nam, MD;  Location: [facility];  Service: Vascular;  Laterality: N/A;   THORACOTOMY  07/16/2012   Procedure: THORACOTOMY OPEN FOR SPINE SURGERY;  Surgeon: Dorise MARLA lucas, MD;  Location: [facility];  Service: Thoracic;  Laterality: N/A;   Patient Active Problem List   Diagnosis Date Noted   Spasticity 09/25/2023   Nerve pain 12/05/2022   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   S/P lumbar spinal fusion 05/25/2022   Synovial cyst of lumbar facet joint 05/25/2022   HNP (herniated nucleus pulposus), lumbar 10/06/2021   Hand pain 06/28/2021   Lumbar facet arthropathy 01/11/2021   Thoracic spondylosis with myelopathy 01/11/2021   Thoracic spinal stenosis 06/06/2017   Stenosis, spinal, thoracic 12/15/2014   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 09/22/2014   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/2011    PCP: Maryl Sayres  REFERRING PROVIDER: Babs Arthea DASEN, MD  REFERRING DIAG: thoracic disc disease with myelopathy, nerve pain, adhesive arachnoiditis  Rationale for Evaluation and Treatment: Rehabilitation  THERAPY DIAG:  Other low back pain  Other symptoms and signs involving the nervous system  Radiculopathy, lumbar region  Muscle weakness (generalized)  Repeated falls  Difficulty in walking, not elsewhere classified  ONSET DATE:  Suddenly started having weakness 11 years ago, most recent episode of worsening October  2022, s/p right L4-5 laminotomy, microdiscectomy on 10/06/2021, now s/p L4-5 PLIF by Dr. Gillie on 05/25/2022.  PERTINENT HISTORY:  Patient is a 43 y.o. male who presents to outpatient physical therapy with a referral for medical diagnosis thoracic disc disease with myelopathy, nerve pain, adhesive arachnoiditis. This patient's  chief complaints consist of disabling low back and R >L leg pain and weakness with activity, bowel and bladder urgency, incontinence, and retention, leading to the following functional deficits: difficulty with or unable to complete any activity that requires weight bearing, use of B LE, and/or balance including working, household and community mobility, walking, driving, going to family gatherings, avoiding incontinent episodes, playing with daughter, helping around the house, bed mobility, transfers. 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 Gillie on 05/25/2022, adhesive arachnoiditis, nerve pain.  Patient denies hx of cancer, stroke, seizures, lung problems, heart problems, diabetes, unexplained weight loss, and osteoporosis.  SUBJECTIVE:                                                                                                                                                                                           SUBJECTIVE STATEMENT:     Patient states he is doing okay today. He states his pain was up to 6/10 after last PT session and it is now 4/10. It was a fight to get up and not be confined to the bed. He set up an appointment on 02/19/2024 with Dr. Babs.   PAIN:  NPRS: 4/10 mostly in low back, right groin/hip, bilateral posterior thighs, right calf.   PRECAUTIONS: Fall  PATIENT GOALS: To get better, walk, drive again, climb stairs, play with my daughter, help around the house again  NEXT MD VISIT:   OBJECTIVE   TREATMENT Manual therapy: to reduce pain and  tissue tension, improve range of motion, neuromodulation, in order to promote improved ability to complete functional activities.  Hooklying R hip caudal/posterior glide with belt, 3-4x30-45 seconds, grade IV  Therapeutic exercise: therapeutic exercises that incorporate ONE parameter at one or more areas of the body to centralize symptoms, develop strength and endurance, range of motion, and flexibility required for successful completion of functional activities.  Seated LAQ on elevated plinth with AW+TB loop R:  4x8/6/6/6 with 17.5#AW + BlueTB loop anchored at  base of plinth (max due to pain) L:  3x12 each side with 17.5#AW + BlueTB loop anchored at base of plinth  Difficulty with reps on R LE towards end of sets Pulling down back of R LE but tolerable  Superset:  Seated overhead pull down (B shoulder extension from ~120 degrees flexion to bar on thighs) with cable bar to improve abdominal strength 1x20 at 35#  1x5 at 35# + 15 at 25# 1x20 at 25#  Seated CHEST press to improve trunk extensor strength 3x15 with 23# Barbell  Seated lumbar flexion roll out with theraball 1x ad lib per pt preference to help calm pain after sled push   Therapeutic activities: dynamic therapeutic activities incorporating MULTIPLE parameters or areas of the body designed to achieve improved functional performance.  Sled push 2x30 feet with sled only (75#) PT helping turn sled Seated break between each set Increasing concordant pain so discontinued for the day   Pt required multimodal cuing for proper technique and to facilitate improved neuromuscular control, strength, range of motion, and functional ability resulting in improved performance and form.   PATIENT EDUCATION:  Education details: Exercise purpose/form. Self management techniques. POC.  Person educated: patient Education method: Explanation Education comprehension: verbalized understanding and needs further education  HOME EXERCISE  PROGRAM: Verbally:  - seated lumbar flexion stretch with head down and one LE extended using rollator, 1x10 each side.  - curl up with legs elevated, 5x15 seconds - sidelying open book 1x10 each side  Access Code: [email or web address] URL: [email or web address] Date: 12/18/2023 Prepared by: Camie Cleverly  Exercises - Seated Diaphragmatic Breathing  - 3 reps - 1 minute practice time - Seated Pelvic Floor Elevators  - 3 x daily - 2 sets - 10 reps - Seated Ankle Plantar Flexion with Resistance Loop  - 1 x daily - 3 sets - 20 reps - Seated Ankle Dorsiflexion AROM  - 1 x daily - 3 sets - 10-30 reps - Seated Ankle Inversion AROM  - 1 x daily - 3 sets - 10-30 reps - Seated Ankle Eversion AROM  - 1 x daily - 3 sets - 10-30 reps - Sidelying Ankle Eversion Strengthening with Ankle Weight  - 1 x daily - 3 sets - 20 reps - Sidelying Ankle Inversion with Ankle Weight  - 1 x daily - 3 sets - 20 reps - Seated Ankle Dorsiflexion with Ankle Weight  - 1 x daily - 3 sets - 20 reps  ASSESSMENT:  CLINICAL IMPRESSION:     Continued with LAD for pain control and strengthening as tolerated. Patient still had elevated pain after pushing the sled last visit. He was able to push the sled half the distance today and completed more seated abdominal and lumbar extensor exercises. Patient would benefit from continued management of limiting condition by skilled physical therapist to address remaining impairments and functional limitations to work towards stated goals and return to PLOF or maximal functional independence.   OBJECTIVE IMPAIRMENTS: Abnormal gait, decreased activity tolerance, decreased balance, decreased coordination, decreased endurance, decreased knowledge of condition, decreased knowledge of use of DME, decreased mobility, difficulty walking, decreased ROM, decreased strength, increased edema, impaired perceived functional ability, increased muscle spasms, impaired flexibility, impaired sensation,  impaired tone, improper body mechanics, postural dysfunction, obesity, and pain.   ACTIVITY LIMITATIONS: carrying, lifting, bending, standing, squatting, stairs, transfers, bed mobility, continence, bathing, toileting, dressing, hygiene/grooming, locomotion level, and caring for others  PARTICIPATION LIMITATIONS: meal prep, cleaning, laundry, interpersonal relationship, driving, shopping, community activity, occupation,  yard work, and  difficulty with or unable to complete any activity that requires weight bearing, use of B LE, and/or balance including working, household and community mobility, walking, driving, going to family gatherings, avoiding incontinent episodes, playing with daughter, helping around the house, bed mobility, transfers  PERSONAL FACTORS: Past/current experiences, Time since onset of injury/illness/exacerbation, and 3+ comorbidities:  3 thoracic spine surgeries, thoracic MRI notes 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 Gillie on 05/25/2022, adhesive arachnoiditis, nerve pain are also affecting patient's functional outcome.   REHAB POTENTIAL: Fair due to severity and nature of condition.   CLINICAL DECISION MAKING: Evolving/moderate complexity  EVALUATION COMPLEXITY: Moderate   GOALS: Goals reviewed with patient? No  SHORT TERM GOALS: Target date: 01/09/2023. Target date updated to 05/28/2023 for all unmet goals on 03/05/2023.   Patient will be independent with initial home exercise program for self-management of symptoms. Baseline: Initial HEP to be provided at visit 2 as appropriate (12/26/22); Goal status: MET   LONG TERM GOALS: Target date:  03/20/2023. Target date updated to 08/26/23  for all unmet goals on 06/03/2023. Target date updated to 11/20/2023 for all  unmet goals on 08/28/2023. Target date updated to 02/17/2024  for all unmet goals on 11/25/2023. Target date updated to 04/14/2024 for all unmet goals on 01/22/2024.   Patient will be independent with a long-term home exercise program for self-management of symptoms.  Baseline: Initial HEP to be provided at visit 2 as appropriate (12/26/22); participating as able (01/31/2023); patient currently participating as tolerated (03/05/2023; 04/09/2023; 06/03/2023); participates daily in mat exercises and in weight bearing exercises as tolerated (10/08/2023; 11/25/2023); Tries to do daily but sometimes cannot when his body won't let him (01/22/2024);  Goal status: In-progress  2.  Patient will demonstrate improved FOTO by equal or greater than 10 points by visit #13 to demonstrate improvement in overall condition and self-reported functional ability.  Baseline: to be tested visit 2 as appropriate (12/26/22); 30 at visit #3 (01/02/2023); 46 at visit #13 (03/05/2023); 45 at visit #29 (06/03/2023); 49 at visit #50 (10/08/2023);  Goal status: MET  3.  Patient will demonstrate the ability to ambulate equal or greater than 600 feet with LRAD during the 6 minute walk to improve his household and community mobility.  Baseline: 34 feet with bari-RW (12/26/22); 200 feet with bari-RW (01/31/2023); 500 feet with BRW (03/05/2023); 400 feet with BRW (04/09/2023); 152 feet with BRW and W/C follow for safety (06/03/2023); 330 feet with BRW (10/08/2023); 245 feet with BRW and SBA for safety (11/25/2023); 343 feet with BRW and SBA for safety (01/22/2024);  Goal status: In progress  4.  Patient will complete 5 Time Sit To Stand Test from 19.5 inch surface or lower in equal or less than 15 seconds with no UE support to demonstrate improved transfer ability for improved household and toileting mobility.  Baseline: 23 seconds with  heavy B UE support on RW from 19.5 inch plinth. Pain throbbing down the right LE.  (12/26/22); 19 seconds with heavy B UE support on RW from 19.5 inch plinth (03/05/2023); 20 seconds with heavy B UE support on RRW from 18.5 inch plinth. Painful in back of B calves, thighs, and lower back (04/09/2023); 15 seconds with  heavy B UE support on BRW from 18.5 inch plinth. Painful in back of B calves, thighs, and lower back (06/03/2023); 19 seconds with heavy B UE support on BRW from 18.5 inch plinth. Painful in back of B calves, thighs, and lower back (10/08/2023); 18 seconds with heavy B UE support on BRW from 18.5 inch plinth. Painful in back of B calves, thighs, and lower back (11/25/2023); 15seconds with heavy B UE support on BRW from 18.5 inch plinth. Painful in back of B calves, thighs, and lower back (01/22/2024);  Goal status: Progressing   5.  Patient will report worst pain equal or less than 3/10 with functional activities to improve his ability to complete basic household and community mobility. Baseline: up to 8/10 (12/26/22); reports 3/10 pain (01/31/2023); reports 4/10 pain (02/19/2023); up to 7/10 in the last 2 weeks (03/05/2023): up to  7/10 in the last two weeks (04/09/2023); up to 8/10 in the last 2 weeks (06/03/2023);  up to 9/10 over the last 2 weeks (10/08/2023); up to 7-8/10 in the last 2 weeks (11/25/2023); 7/10 in the last 2 weeks (01/22/2024);  Goal status: Ongoing  6.  Patient will demonstrate improvement in Patient Specific Functional Scale (PSFS) by equal or greater than 3/10 points to reflect clinically significant improvement in patient's most valued functional activities. Baseline: 1.7/10 for Walking Around [address], Enjoying Life, and Driving (97/74/74); 7.2/89 (11/25/2023); 2.7/10 (01/22/24);  Goal status: progressing    PLAN:  PT FREQUENCY: 1-2x/week  PT DURATION: 12 weeks  PLANNED INTERVENTIONS: Therapeutic exercises, Therapeutic activity, Neuromuscular re-education, Balance training,  Gait training, Patient/Family education, Self Care, Joint mobilization, Stair training, Orthotic/Fit training, DME instructions, Aquatic Therapy, Dry Needling, Electrical stimulation, Wheelchair mobility training, Spinal mobilization, Cryotherapy, Moist heat, Manual therapy, and Re-evaluation.  PLAN FOR NEXT SESSION:  update HEP as appropriate, neurodynamics, gait training, LE/core/functional strengthening, stretching, and balance as tolerated, education, manual therapy as needed. Pelvic floor.   Camie SAUNDERS. Juli, PT, DPT 02/06/24, 6:12 PM  [facility] [address] [place], [place] [address] P: [phone] I F: [phone]

## 2024-02-07 ENCOUNTER — Other Ambulatory Visit: Payer: Self-pay | Admitting: Physical Medicine & Rehabilitation

## 2024-02-07 DIAGNOSIS — M792 Neuralgia and neuritis, unspecified: Secondary | ICD-10-CM

## 2024-02-07 DIAGNOSIS — G039 Meningitis, unspecified: Secondary | ICD-10-CM

## 2024-02-07 DIAGNOSIS — M5104 Intervertebral disc disorders with myelopathy, thoracic region: Secondary | ICD-10-CM

## 2024-02-10 ENCOUNTER — Other Ambulatory Visit: Payer: Self-pay | Admitting: Physical Medicine & Rehabilitation

## 2024-02-10 DIAGNOSIS — R252 Cramp and spasm: Secondary | ICD-10-CM

## 2024-02-11 ENCOUNTER — Ambulatory Visit: Attending: Physical Medicine & Rehabilitation | Admitting: Physical Therapy

## 2024-02-11 ENCOUNTER — Encounter: Payer: Self-pay | Admitting: Physical Therapy

## 2024-02-11 DIAGNOSIS — M5441 Lumbago with sciatica, right side: Secondary | ICD-10-CM | POA: Insufficient documentation

## 2024-02-11 DIAGNOSIS — G8929 Other chronic pain: Secondary | ICD-10-CM | POA: Insufficient documentation

## 2024-02-11 DIAGNOSIS — R262 Difficulty in walking, not elsewhere classified: Secondary | ICD-10-CM | POA: Insufficient documentation

## 2024-02-11 DIAGNOSIS — M6281 Muscle weakness (generalized): Secondary | ICD-10-CM | POA: Insufficient documentation

## 2024-02-11 DIAGNOSIS — M5459 Other low back pain: Secondary | ICD-10-CM | POA: Diagnosis present

## 2024-02-11 DIAGNOSIS — M5442 Lumbago with sciatica, left side: Secondary | ICD-10-CM | POA: Insufficient documentation

## 2024-02-11 DIAGNOSIS — R2689 Other abnormalities of gait and mobility: Secondary | ICD-10-CM | POA: Diagnosis present

## 2024-02-11 DIAGNOSIS — R29818 Other symptoms and signs involving the nervous system: Secondary | ICD-10-CM | POA: Insufficient documentation

## 2024-02-11 DIAGNOSIS — M5416 Radiculopathy, lumbar region: Secondary | ICD-10-CM | POA: Insufficient documentation

## 2024-02-11 DIAGNOSIS — R296 Repeated falls: Secondary | ICD-10-CM | POA: Diagnosis present

## 2024-02-11 DIAGNOSIS — M546 Pain in thoracic spine: Secondary | ICD-10-CM | POA: Insufficient documentation

## 2024-02-11 NOTE — Therapy (Signed)
 OUTPATIENT PHYSICAL THERAPY TREATMENT   Patient Name: Travis Fitzpatrick MRN: [id number] DOB:01-24-1981, 43 y.o., male Today's Date: 02/11/24   END OF SESSION:  PT End of Session - 02/11/24 1725     Visit Number 74    Number of Visits 93    Date for PT Re-Evaluation 04/15/24    Authorization Type MEDICARE PART B reporting period from 01/22/2024    Progress Note Due on Visit 80    PT Start Time 1650    PT Stop Time 1732    PT Time Calculation (min) 42 min    Activity Tolerance Patient limited by pain;Patient tolerated treatment well    Behavior During Therapy [facility] for tasks assessed/performed               Past Medical History:  Diagnosis Date   Bowel trouble    urgency   Medical history non-contributory    Urinary urgency    Past Surgical History:  Procedure Laterality Date   BACK SURGERY  2010   CIRCUMCISION     LUMBAR LAMINECTOMY/DECOMPRESSION MICRODISCECTOMY  07/20/2011   Procedure: LUMBAR LAMINECTOMY/DECOMPRESSION MICRODISCECTOMY;  Surgeon: Rockey LITTIE Peru;  Location: [facility];  Service: Neurosurgery;  Laterality: N/A;  right thoracotomy with thoracic eight-nine discectomy and fusion   LUMBAR LAMINECTOMY/DECOMPRESSION MICRODISCECTOMY Right 10/06/2021   Procedure: Right Lumbar Four-Five Microdiscectomy, Right Lumbar Five-Sacal One Foraminotomy;  Surgeon: Peru Rockey, MD;  Location: [facility];  Service: Neurosurgery;  Laterality: Right;  3C/RM 21   THORACIC DISCECTOMY  07/16/2012   Procedure: THORACIC DISCECTOMY;  Surgeon: Rockey LITTIE Peru, MD;  Location: [facility];  Service: Neurosurgery;  Laterality: Right;  RIGHT Thoracic seven-eight  thoracic diskectomy via thoracotomy by dr lucas   THORACIC DISCECTOMY N/A 12/15/2014   Procedure: THORACIC SEVEN TO THORACIC NINE Laminectomy ;  Surgeon: Rockey Peru, MD;  Location: [facility];  Service: Neurosurgery;  Laterality: N/A;   THORACIC DISCECTOMY N/A 06/06/2017   Procedure: LAMINECTOMY THORACIC NINE-TEN;  Surgeon: [place] Rockey, MD;  Location: [facility];  Service: Neurosurgery;  Laterality: N/A;  LAMINECTOMY THORACIC NINE-TEN   THORACOTOMY  07/20/2011   Procedure: THORACOTOMY OPEN FOR SPINE SURGERY;  Surgeon: JONETTA Belvie Nam, MD;  Location: [facility];  Service: Vascular;  Laterality: N/A;   THORACOTOMY  07/16/2012   Procedure: THORACOTOMY OPEN FOR SPINE SURGERY;  Surgeon: Dorise MARLA lucas, MD;  Location: [facility];  Service: Thoracic;  Laterality: N/A;   Patient Active Problem List   Diagnosis Date Noted   Spasticity 09/25/2023   Nerve pain 12/05/2022   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   S/P lumbar spinal fusion 05/25/2022   Synovial cyst of lumbar facet joint 05/25/2022   HNP (herniated nucleus pulposus), lumbar 10/06/2021   Hand pain 06/28/2021   Lumbar facet arthropathy 01/11/2021   Thoracic spondylosis with myelopathy 01/11/2021   Thoracic spinal stenosis 06/06/2017   Stenosis, spinal, thoracic 12/15/2014   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 09/22/2014   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/2011    PCP: Maryl Sayres  REFERRING PROVIDER: Babs Arthea DASEN, MD  REFERRING DIAG: thoracic disc disease with myelopathy, nerve pain, adhesive arachnoiditis  Rationale for Evaluation and Treatment: Rehabilitation  THERAPY DIAG:  Other low back pain  Other symptoms and signs involving the nervous system  Radiculopathy, lumbar region  Muscle weakness (generalized)  Repeated falls  Difficulty in walking, not elsewhere classified  ONSET DATE:  Suddenly started having weakness 11 years ago, most recent episode of worsening  October 2022, s/p right L4-5 laminotomy, microdiscectomy on 10/06/2021, now s/p L4-5 PLIF by Dr. Gillie on 05/25/2022.  PERTINENT HISTORY:  Patient is a 43 y.o. male who presents to outpatient physical therapy with a referral for medical diagnosis thoracic disc disease with myelopathy, nerve pain, adhesive arachnoiditis. This patient's  chief complaints consist of disabling low back and R >L leg pain and weakness with activity, bowel and bladder urgency, incontinence, and retention, leading to the following functional deficits: difficulty with or unable to complete any activity that requires weight bearing, use of B LE, and/or balance including working, household and community mobility, walking, driving, going to family gatherings, avoiding incontinent episodes, playing with daughter, helping around the house, bed mobility, transfers. 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 Gillie on 05/25/2022, adhesive arachnoiditis, nerve pain.  Patient denies hx of cancer, stroke, seizures, lung problems, heart problems, diabetes, unexplained weight loss, and osteoporosis.  SUBJECTIVE:                                                                                                                                                                                           SUBJECTIVE STATEMENT:     Patient states he is doing okay. His pain is lower end today.  It was a little rough after last PT session because his right lower leg was hurting. He did not get stuck in the bed.   PAIN:  NPRS: 2/10 mostly in low back, right groin/hip, bilateral posterior thighs, right calf.   PRECAUTIONS: Fall  PATIENT GOALS: To get better, walk, drive again, climb stairs, play with my daughter, help around the house again  NEXT MD VISIT:   OBJECTIVE   TREATMENT Manual therapy: to reduce pain and tissue tension, improve range of motion,  neuromodulation, in order to promote improved ability to complete functional activities.  Hooklying R hip caudal/posterior glide with belt, 3-4x30-45 seconds, grade IV  Therapeutic exercise: therapeutic exercises that incorporate ONE parameter at one or more areas of the body to centralize symptoms, develop strength and endurance, range of motion, and flexibility required for successful completion of functional activities.  Seated LAQ on elevated plinth with AW+TB loop R:  4x11/10/10/8 with 17.5#AW + BlueTB loop anchored at base of plinth (max due to pain) L:  3x12  each side with 17.5#AW + BlueTB loop anchored at base of plinth  Difficulty with reps on R LE towards end of sets Pulling down back of R LE but tolerable   Seated overhead pull down (B shoulder extension from ~120 degrees flexion to bar on thighs) with cable bar to improve abdominal strength 2x20 at 30#  Tried 35# and 25# but too painful and too easy, respectively  Seated lumbar flexion  1x ad lib per pt preference to help calm pain after pain producing  Therapeutic activities: dynamic therapeutic activities incorporating MULTIPLE parameters or areas of the body designed to achieve improved functional performance.  Sled push 4x30 feet with sled only (75#) PT turns sled Seated break between each set Increasing concordant pain so discontinued for the day   Pt required multimodal cuing for proper technique and to facilitate improved neuromuscular control, strength, range of motion, and functional ability resulting in improved performance and form.   PATIENT EDUCATION:  Education details: Exercise purpose/form. Self management techniques. POC.  Person educated: patient Education method: Explanation Education comprehension: verbalized understanding and needs further education  HOME EXERCISE PROGRAM: Verbally:  - seated lumbar flexion stretch with head down and one LE extended using rollator, 1x10 each side.  - curl up  with legs elevated, 5x15 seconds - sidelying open book 1x10 each side  Access Code: [email or web address] URL: [email or web address] Date: 12/18/2023 Prepared by: Camie Cleverly  Exercises - Seated Diaphragmatic Breathing  - 3 reps - 1 minute practice time - Seated Pelvic Floor Elevators  - 3 x daily - 2 sets - 10 reps - Seated Ankle Plantar Flexion with Resistance Loop  - 1 x daily - 3 sets - 20 reps - Seated Ankle Dorsiflexion AROM  - 1 x daily - 3 sets - 10-30 reps - Seated Ankle Inversion AROM  - 1 x daily - 3 sets - 10-30 reps - Seated Ankle Eversion AROM  - 1 x daily - 3 sets - 10-30 reps - Sidelying Ankle Eversion Strengthening with Ankle Weight  - 1 x daily - 3 sets - 20 reps - Sidelying Ankle Inversion with Ankle Weight  - 1 x daily - 3 sets - 20 reps - Seated Ankle Dorsiflexion with Ankle Weight  - 1 x daily - 3 sets - 20 reps  ASSESSMENT:  CLINICAL IMPRESSION:     Continued with hip distraction for pain control and strengthening as tolerated. Patient better able to tolerate sled push today but continues to have elevated pain after that limites his participation some in other activities. He continues to get some pain relief with hip distraction. Continued PT is medically necessary to help prevent functional decline. Patient would benefit from continued management of limiting condition by skilled physical therapist to address remaining impairments and functional limitations to work towards stated goals and return to PLOF or maximal functional independence.    OBJECTIVE IMPAIRMENTS: Abnormal gait, decreased activity tolerance, decreased balance, decreased coordination, decreased endurance, decreased knowledge of condition, decreased knowledge of use of DME, decreased mobility, difficulty walking, decreased ROM, decreased strength, increased edema, impaired perceived functional ability, increased muscle spasms, impaired flexibility, impaired sensation, impaired tone, improper body  mechanics, postural dysfunction, obesity, and pain.   ACTIVITY LIMITATIONS: carrying, lifting, bending, standing, squatting, stairs, transfers, bed mobility, continence, bathing, toileting, dressing, hygiene/grooming, locomotion level, and caring for others  PARTICIPATION LIMITATIONS: meal prep, cleaning, laundry, interpersonal relationship, driving, shopping, community activity, occupation, yard work, and  difficulty with or unable to complete any activity that requires  weight bearing, use of B LE, and/or balance including working, household and community mobility, walking, driving, going to family gatherings, avoiding incontinent episodes, playing with daughter, helping around the house, bed mobility, transfers  PERSONAL FACTORS: Past/current experiences, Time since onset of injury/illness/exacerbation, and 3+ comorbidities:  3 thoracic spine surgeries, thoracic MRI notes 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 Gillie on 05/25/2022, adhesive arachnoiditis, nerve pain are also affecting patient's functional outcome.   REHAB POTENTIAL: Fair due to severity and nature of condition.   CLINICAL DECISION MAKING: Evolving/moderate complexity  EVALUATION COMPLEXITY: Moderate   GOALS: Goals reviewed with patient? No  SHORT TERM GOALS: Target date: 01/09/2023. Target date updated to 05/28/2023 for all unmet goals on 03/05/2023.   Patient will be independent with initial home exercise program for self-management of symptoms. Baseline: Initial HEP to be provided at visit 2 as appropriate (12/26/22); Goal status: MET   LONG TERM GOALS: Target date: 03/20/2023. Target date updated to  08/26/23  for all unmet goals on 06/03/2023. Target date updated to 11/20/2023 for all  unmet goals on 08/28/2023. Target date updated to 02/17/2024  for all unmet goals on 11/25/2023. Target date updated to 04/14/2024 for all unmet goals on 01/22/2024.   Patient will be independent with a long-term home exercise program for self-management of symptoms.  Baseline: Initial HEP to be provided at visit 2 as appropriate (12/26/22); participating as able (01/31/2023); patient currently participating as tolerated (03/05/2023; 04/09/2023; 06/03/2023); participates daily in mat exercises and in weight bearing exercises as tolerated (10/08/2023; 11/25/2023); Tries to do daily but sometimes cannot when his body won't let him (01/22/2024);  Goal status: In-progress  2.  Patient will demonstrate improved FOTO by equal or greater than 10 points by visit #13 to demonstrate improvement in overall condition and self-reported functional ability.  Baseline: to be tested visit 2 as appropriate (12/26/22); 30 at visit #3 (01/02/2023); 46 at visit #13 (03/05/2023); 45 at visit #29 (06/03/2023); 49 at visit #50 (10/08/2023);  Goal status: MET  3.  Patient will demonstrate the ability to ambulate equal or greater than 600 feet with LRAD during the 6 minute walk to improve his household and community mobility.  Baseline: 34 feet with bari-RW (12/26/22); 200 feet with bari-RW (01/31/2023); 500 feet with BRW (03/05/2023); 400 feet with BRW (04/09/2023); 152 feet with BRW and W/C follow for safety (06/03/2023); 330 feet with BRW (10/08/2023); 245 feet with BRW and SBA for safety (11/25/2023); 343 feet with BRW and SBA for safety (01/22/2024);  Goal status: In progress  4.  Patient will complete 5 Time Sit To Stand Test from 19.5 inch surface or lower in equal or less than 15 seconds with no UE support to demonstrate improved transfer ability for improved household and toileting mobility.  Baseline: 23 seconds with heavy B UE support on RW from 19.5  inch plinth. Pain throbbing down the right LE.  (12/26/22); 19 seconds with heavy B UE support on RW from 19.5 inch plinth (03/05/2023); 20 seconds with heavy B UE support on RRW from 18.5 inch plinth. Painful in back of B calves, thighs, and lower back (04/09/2023); 15 seconds with heavy B UE support on BRW from 18.5 inch plinth. Painful in back of  B calves, thighs, and lower back (06/03/2023); 19 seconds with heavy B UE support on BRW from 18.5 inch plinth. Painful in back of B calves, thighs, and lower back (10/08/2023); 18 seconds with heavy B UE support on BRW from 18.5 inch plinth. Painful in back of B calves, thighs, and lower back (11/25/2023); 15seconds with heavy B UE support on BRW from 18.5 inch plinth. Painful in back of B calves, thighs, and lower back (01/22/2024);  Goal status: Progressing   5.  Patient will report worst pain equal or less than 3/10 with functional activities to improve his ability to complete basic household and community mobility. Baseline: up to 8/10 (12/26/22); reports 3/10 pain (01/31/2023); reports 4/10 pain (02/19/2023); up to 7/10 in the last 2 weeks (03/05/2023): up to  7/10 in the last two weeks (04/09/2023); up to 8/10 in the last 2 weeks (06/03/2023);  up to 9/10 over the last 2 weeks (10/08/2023); up to 7-8/10 in the last 2 weeks (11/25/2023); 7/10 in the last 2 weeks (01/22/2024);  Goal status: Ongoing  6.  Patient will demonstrate improvement in Patient Specific Functional Scale (PSFS) by equal or greater than 3/10 points to reflect clinically significant improvement in patient's most valued functional activities. Baseline: 1.7/10 for Walking Around [address], Enjoying Life, and Driving (97/74/74); 7.2/89 (11/25/2023); 2.7/10 (01/22/24);  Goal status: progressing    PLAN:  PT FREQUENCY: 1-2x/week  PT DURATION: 12 weeks  PLANNED INTERVENTIONS: Therapeutic exercises, Therapeutic activity, Neuromuscular re-education, Balance training, Gait training, Patient/Family  education, Self Care, Joint mobilization, Stair training, Orthotic/Fit training, DME instructions, Aquatic Therapy, Dry Needling, Electrical stimulation, Wheelchair mobility training, Spinal mobilization, Cryotherapy, Moist heat, Manual therapy, and Re-evaluation.  PLAN FOR NEXT SESSION:  update HEP as appropriate, neurodynamics, gait training, LE/core/functional strengthening, stretching, and balance as tolerated, education, manual therapy as needed. Pelvic floor.   Camie SAUNDERS. Juli, PT, DPT 02/11/24, 5:33 PM  [facility] [address] [place], [place] [address] P: [phone] I F: [phone]

## 2024-02-13 ENCOUNTER — Ambulatory Visit: Admitting: Physical Therapy

## 2024-02-17 ENCOUNTER — Ambulatory Visit: Admitting: Physical Therapy

## 2024-02-17 ENCOUNTER — Encounter: Payer: Self-pay | Admitting: Physical Therapy

## 2024-02-17 DIAGNOSIS — R262 Difficulty in walking, not elsewhere classified: Secondary | ICD-10-CM

## 2024-02-17 DIAGNOSIS — R296 Repeated falls: Secondary | ICD-10-CM

## 2024-02-17 DIAGNOSIS — M5459 Other low back pain: Secondary | ICD-10-CM | POA: Diagnosis not present

## 2024-02-17 DIAGNOSIS — R29818 Other symptoms and signs involving the nervous system: Secondary | ICD-10-CM

## 2024-02-17 DIAGNOSIS — M5416 Radiculopathy, lumbar region: Secondary | ICD-10-CM

## 2024-02-17 DIAGNOSIS — M6281 Muscle weakness (generalized): Secondary | ICD-10-CM

## 2024-02-17 NOTE — Therapy (Signed)
 OUTPATIENT PHYSICAL THERAPY TREATMENT   Patient Name: Travis Fitzpatrick MRN: [id number] DOB:02/07/1981, 43 y.o., male Today's Date: 02/17/24   END OF SESSION:  PT End of Session - 02/17/24 1728     Visit Number 75    Number of Visits 93    Date for PT Re-Evaluation 04/15/24    Authorization Type MEDICARE PART B reporting period from 01/22/2024    Progress Note Due on Visit 80    PT Start Time 1656    PT Stop Time 1729    PT Time Calculation (min) 33 min    Activity Tolerance Patient limited by pain;Patient tolerated treatment well    Behavior During Therapy [facility] for tasks assessed/performed                Past Medical History:  Diagnosis Date   Bowel trouble    urgency   Medical history non-contributory    Urinary urgency    Past Surgical History:  Procedure Laterality Date   BACK SURGERY  2010   CIRCUMCISION     LUMBAR LAMINECTOMY/DECOMPRESSION MICRODISCECTOMY  07/20/2011   Procedure: LUMBAR LAMINECTOMY/DECOMPRESSION MICRODISCECTOMY;  Surgeon: Rockey LITTIE Peru;  Location: [facility];  Service: Neurosurgery;  Laterality: N/A;  right thoracotomy with thoracic eight-nine discectomy and fusion   LUMBAR LAMINECTOMY/DECOMPRESSION MICRODISCECTOMY Right 10/06/2021   Procedure: Right Lumbar Four-Five Microdiscectomy, Right Lumbar Five-Sacal One Foraminotomy;  Surgeon: Peru Rockey, MD;  Location: [facility];  Service: Neurosurgery;  Laterality: Right;  3C/RM 21   THORACIC DISCECTOMY  07/16/2012   Procedure: THORACIC DISCECTOMY;  Surgeon: Rockey LITTIE Peru, MD;  Location: [facility];  Service: Neurosurgery;  Laterality: Right;  RIGHT Thoracic seven-eight  thoracic diskectomy via thoracotomy by dr lucas   THORACIC DISCECTOMY N/A 12/15/2014   Procedure: THORACIC SEVEN TO THORACIC NINE Laminectomy ;  Surgeon: Rockey Peru, MD;  Location: [facility];  Service: Neurosurgery;  Laterality: N/A;   THORACIC DISCECTOMY N/A 06/06/2017   Procedure: LAMINECTOMY THORACIC NINE-TEN;  Surgeon:  Peru Rockey, MD;  Location: [facility];  Service: Neurosurgery;  Laterality: N/A;  LAMINECTOMY THORACIC NINE-TEN   THORACOTOMY  07/20/2011   Procedure: THORACOTOMY OPEN FOR SPINE SURGERY;  Surgeon: JONETTA Belvie Nam, MD;  Location: [facility];  Service: Vascular;  Laterality: N/A;   THORACOTOMY  07/16/2012   Procedure: THORACOTOMY OPEN FOR SPINE SURGERY;  Surgeon: Dorise MARLA lucas, MD;  Location: [facility];  Service: Thoracic;  Laterality: N/A;   Patient Active Problem List   Diagnosis Date Noted   Spasticity 09/25/2023   Nerve pain 12/05/2022   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   S/P lumbar spinal fusion 05/25/2022   Synovial cyst of lumbar facet joint 05/25/2022   HNP (herniated nucleus pulposus), lumbar 10/06/2021   Hand pain 06/28/2021   Lumbar facet arthropathy 01/11/2021   Thoracic spondylosis with myelopathy 01/11/2021   Thoracic spinal stenosis 06/06/2017   Stenosis, spinal, thoracic 12/15/2014   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 09/22/2014   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/2011    PCP: Maryl Sayres  REFERRING PROVIDER: Babs Arthea DASEN, MD  REFERRING DIAG: thoracic disc disease with myelopathy, nerve pain, adhesive arachnoiditis  Rationale for Evaluation and Treatment: Rehabilitation  THERAPY DIAG:  Other low back pain  Other symptoms and signs involving the nervous system  Radiculopathy, lumbar region  Muscle weakness (generalized)  Repeated falls  Difficulty in walking, not elsewhere classified  ONSET DATE:  Suddenly started having weakness 11 years ago, most recent episode of  worsening October 2022, s/p right L4-5 laminotomy, microdiscectomy on 10/06/2021, now s/p L4-5 PLIF by Dr. Gillie on 05/25/2022.  PERTINENT HISTORY:  Patient is a 43 y.o. male who presents to outpatient physical therapy with a referral for medical diagnosis thoracic disc disease with myelopathy, nerve pain, adhesive arachnoiditis. This  patient's chief complaints consist of disabling low back and R >L leg pain and weakness with activity, bowel and bladder urgency, incontinence, and retention, leading to the following functional deficits: difficulty with or unable to complete any activity that requires weight bearing, use of B LE, and/or balance including working, household and community mobility, walking, driving, going to family gatherings, avoiding incontinent episodes, playing with daughter, helping around the house, bed mobility, transfers. 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 Gillie on 05/25/2022, adhesive arachnoiditis, nerve pain.  Patient denies hx of cancer, stroke, seizures, lung problems, heart problems, diabetes, unexplained weight loss, and osteoporosis.  SUBJECTIVE:                                                                                                                                                                                           SUBJECTIVE STATEMENT:     Patient states he was okay on Wednesday last week. He rode down to [place] on Thursday and when he got up on Friday his legs wouldn't work and it was too painful so he missed the cookout. He had to stay until Saturday night before he could get to the car to come home. He states he has continued to be irritated but able to get out of bed yesterday and today.   PAIN:  NPRS: 5/10 mostly in low back, right groin/hip, bilateral posterior thighs, right calf.   PRECAUTIONS: Fall  PATIENT GOALS: To get better, walk, drive again, climb  stairs, play with my daughter, help around the house again  NEXT MD VISIT:   OBJECTIVE   TREATMENT Manual therapy: to reduce pain and tissue tension, improve range of motion, neuromodulation, in order to promote improved ability to complete functional activities.  Hooklying R hip caudal/posterior glide with belt, 3-4x30-45 seconds, grade IV  Therapeutic exercise: therapeutic exercises that incorporate ONE parameter at one or more areas of the body to centralize symptoms, develop strength and endurance, range of motion, and flexibility  required for successful completion of functional activities.  Seated LAQ on elevated plinth with AW+TB loop R:  4x8/8/8/8 with 17.5#AW + BlueTB loop anchored at base of plinth (max due to pain) L:  3x12 each side with 17.5#AW + BlueTB loop anchored at base of plinth  Difficulty with reps on R LE towards end of sets Pulling down back of R LE but tolerable  Circuit:  Seated overhead pull down (B shoulder extension from ~120 degrees flexion to bar on thighs) with cable bar to improve abdominal strength 3x20 at 25#   Seated CHEST press to improve trunk extensor strength 2x15/12 with 23# Barbell  Seated lumbar flexion  1x ad lib per pt preference to help calm pain after pain producing  Pt required multimodal cuing for proper technique and to facilitate improved neuromuscular control, strength, range of motion, and functional ability resulting in improved performance and form.   PATIENT EDUCATION:  Education details: Exercise purpose/form. Self management techniques. POC.  Person educated: patient Education method: Explanation Education comprehension: verbalized understanding and needs further education  HOME EXERCISE PROGRAM: Verbally:  - seated lumbar flexion stretch with head down and one LE extended using rollator, 1x10 each side.  - curl up with legs elevated, 5x15 seconds - sidelying open book 1x10 each side  Access Code: [email or web address] URL:  [email or web address] Date: 12/18/2023 Prepared by: Camie Cleverly  Exercises - Seated Diaphragmatic Breathing  - 3 reps - 1 minute practice time - Seated Pelvic Floor Elevators  - 3 x daily - 2 sets - 10 reps - Seated Ankle Plantar Flexion with Resistance Loop  - 1 x daily - 3 sets - 20 reps - Seated Ankle Dorsiflexion AROM  - 1 x daily - 3 sets - 10-30 reps - Seated Ankle Inversion AROM  - 1 x daily - 3 sets - 10-30 reps - Seated Ankle Eversion AROM  - 1 x daily - 3 sets - 10-30 reps - Sidelying Ankle Eversion Strengthening with Ankle Weight  - 1 x daily - 3 sets - 20 reps - Sidelying Ankle Inversion with Ankle Weight  - 1 x daily - 3 sets - 20 reps - Seated Ankle Dorsiflexion with Ankle Weight  - 1 x daily - 3 sets - 20 reps  ASSESSMENT:  CLINICAL IMPRESSION:     Patient was late today, so session was shortened. He also had elevated pain today, so did not complete sled push. Patient continued with quad strengthening and core strengthening as tolerated. Patient would benefit from continued management of limiting condition by skilled physical therapist to address remaining impairments and functional limitations to work towards stated goals and return to PLOF or maximal functional independence.     OBJECTIVE IMPAIRMENTS: Abnormal gait, decreased activity tolerance, decreased balance, decreased coordination, decreased endurance, decreased knowledge of condition, decreased knowledge of use of DME, decreased mobility, difficulty walking, decreased ROM, decreased strength, increased edema, impaired perceived functional ability, increased muscle spasms, impaired flexibility, impaired sensation, impaired tone, improper body mechanics, postural dysfunction, obesity, and pain.   ACTIVITY LIMITATIONS: carrying, lifting, bending, standing, squatting, stairs, transfers, bed mobility, continence, bathing, toileting, dressing, hygiene/grooming, locomotion level, and caring for  others  PARTICIPATION LIMITATIONS: meal prep, cleaning, laundry, interpersonal relationship, driving, shopping, community activity, occupation, yard work, and  difficulty with or unable to complete any activity that requires weight bearing, use of B LE, and/or balance including working, household and community mobility, walking, driving, going to family gatherings, avoiding incontinent episodes, playing with daughter, helping around the house,  bed mobility, transfers  PERSONAL FACTORS: Past/current experiences, Time since onset of injury/illness/exacerbation, and 3+ comorbidities:  3 thoracic spine surgeries, thoracic MRI notes 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 Gillie on 05/25/2022, adhesive arachnoiditis, nerve pain are also affecting patient's functional outcome.   REHAB POTENTIAL: Fair due to severity and nature of condition.   CLINICAL DECISION MAKING: Evolving/moderate complexity  EVALUATION COMPLEXITY: Moderate   GOALS: Goals reviewed with patient? No  SHORT TERM GOALS: Target date: 01/09/2023. Target date updated to 05/28/2023 for all unmet goals on 03/05/2023.   Patient will be independent with initial home exercise program for self-management of symptoms. Baseline: Initial HEP to be provided at visit 2 as appropriate (12/26/22); Goal status: MET   LONG TERM GOALS: Target date: 03/20/2023. Target date updated to 08/26/23  for all unmet goals on 06/03/2023. Target date updated to 11/20/2023 for all  unmet goals on 08/28/2023. Target date updated to 02/17/2024  for all unmet goals on 11/25/2023. Target date updated to 04/14/2024 for all unmet goals on 01/22/2024.   Patient  will be independent with a long-term home exercise program for self-management of symptoms.  Baseline: Initial HEP to be provided at visit 2 as appropriate (12/26/22); participating as able (01/31/2023); patient currently participating as tolerated (03/05/2023; 04/09/2023; 06/03/2023); participates daily in mat exercises and in weight bearing exercises as tolerated (10/08/2023; 11/25/2023); Tries to do daily but sometimes cannot when his body won't let him (01/22/2024);  Goal status: In-progress  2.  Patient will demonstrate improved FOTO by equal or greater than 10 points by visit #13 to demonstrate improvement in overall condition and self-reported functional ability.  Baseline: to be tested visit 2 as appropriate (12/26/22); 30 at visit #3 (01/02/2023); 46 at visit #13 (03/05/2023); 45 at visit #29 (06/03/2023); 49 at visit #50 (10/08/2023);  Goal status: MET  3.  Patient will demonstrate the ability to ambulate equal or greater than 600 feet with LRAD during the 6 minute walk to improve his household and community mobility.  Baseline: 34 feet with bari-RW (12/26/22); 200 feet with bari-RW (01/31/2023); 500 feet with BRW (03/05/2023); 400 feet with BRW (04/09/2023); 152 feet with BRW and W/C follow for safety (06/03/2023); 330 feet with BRW (10/08/2023); 245 feet with BRW and SBA for safety (11/25/2023); 343 feet with BRW and SBA for safety (01/22/2024);  Goal status: In progress  4.  Patient will complete 5 Time Sit To Stand Test from 19.5 inch surface or lower in equal or less than 15 seconds with no UE support to demonstrate improved transfer ability for improved household and toileting mobility.  Baseline: 23 seconds with heavy B UE support on RW from 19.5 inch plinth. Pain throbbing down the right LE.  (12/26/22); 19 seconds with heavy B UE support on RW from 19.5 inch plinth (03/05/2023); 20 seconds with heavy B UE support on RRW from 18.5 inch plinth. Painful in back of B calves, thighs, and lower back  (04/09/2023); 15 seconds with heavy B UE support on BRW from 18.5 inch plinth. Painful in back of B calves, thighs, and lower back (06/03/2023); 19 seconds with heavy B UE support on BRW from 18.5 inch plinth. Painful in back of B calves, thighs, and lower back (  10/08/2023); 18 seconds with heavy B UE support on BRW from 18.5 inch plinth. Painful in back of B calves, thighs, and lower back (11/25/2023); 15seconds with heavy B UE support on BRW from 18.5 inch plinth. Painful in back of B calves, thighs, and lower back (01/22/2024);  Goal status: Progressing   5.  Patient will report worst pain equal or less than 3/10 with functional activities to improve his ability to complete basic household and community mobility. Baseline: up to 8/10 (12/26/22); reports 3/10 pain (01/31/2023); reports 4/10 pain (02/19/2023); up to 7/10 in the last 2 weeks (03/05/2023): up to  7/10 in the last two weeks (04/09/2023); up to 8/10 in the last 2 weeks (06/03/2023);  up to 9/10 over the last 2 weeks (10/08/2023); up to 7-8/10 in the last 2 weeks (11/25/2023); 7/10 in the last 2 weeks (01/22/2024);  Goal status: Ongoing  6.  Patient will demonstrate improvement in Patient Specific Functional Scale (PSFS) by equal or greater than 3/10 points to reflect clinically significant improvement in patient's most valued functional activities. Baseline: 1.7/10 for Walking Around [address], Enjoying Life, and Driving (97/74/74); 7.2/89 (11/25/2023); 2.7/10 (01/22/24);  Goal status: progressing    PLAN:  PT FREQUENCY: 1-2x/week  PT DURATION: 12 weeks  PLANNED INTERVENTIONS: Therapeutic exercises, Therapeutic activity, Neuromuscular re-education, Balance training, Gait training, Patient/Family education, Self Care, Joint mobilization, Stair training, Orthotic/Fit training, DME instructions, Aquatic Therapy, Dry Needling, Electrical stimulation, Wheelchair mobility training, Spinal mobilization, Cryotherapy, Moist heat, Manual therapy, and  Re-evaluation.  PLAN FOR NEXT SESSION:  update HEP as appropriate, neurodynamics, gait training, LE/core/functional strengthening, stretching, and balance as tolerated, education, manual therapy as needed. Pelvic floor.   Camie SAUNDERS. Juli, PT, DPT 02/17/24, 6:12 PM  [facility] [address] [place], [place] [address] P: [phone] I F: [phone]

## 2024-02-19 ENCOUNTER — Encounter: Attending: Physical Medicine & Rehabilitation | Admitting: Physical Medicine & Rehabilitation

## 2024-02-19 ENCOUNTER — Ambulatory Visit: Admitting: Physical Therapy

## 2024-02-19 ENCOUNTER — Encounter: Payer: Self-pay | Admitting: Physical Medicine & Rehabilitation

## 2024-02-19 VITALS — BP 127/79 | HR 108 | Ht 70.0 in | Wt 314.8 lb

## 2024-02-19 DIAGNOSIS — G039 Meningitis, unspecified: Secondary | ICD-10-CM | POA: Diagnosis not present

## 2024-02-19 DIAGNOSIS — Z5941 Food insecurity: Secondary | ICD-10-CM | POA: Diagnosis not present

## 2024-02-19 DIAGNOSIS — M5104 Intervertebral disc disorders with myelopathy, thoracic region: Secondary | ICD-10-CM | POA: Insufficient documentation

## 2024-02-19 DIAGNOSIS — G5601 Carpal tunnel syndrome, right upper limb: Secondary | ICD-10-CM | POA: Diagnosis not present

## 2024-02-19 DIAGNOSIS — M79661 Pain in right lower leg: Secondary | ICD-10-CM | POA: Insufficient documentation

## 2024-02-19 DIAGNOSIS — M549 Dorsalgia, unspecified: Secondary | ICD-10-CM | POA: Insufficient documentation

## 2024-02-19 DIAGNOSIS — Z5986 Financial insecurity: Secondary | ICD-10-CM | POA: Insufficient documentation

## 2024-02-19 DIAGNOSIS — M25551 Pain in right hip: Secondary | ICD-10-CM | POA: Insufficient documentation

## 2024-02-19 DIAGNOSIS — M4716 Other spondylosis with myelopathy, lumbar region: Secondary | ICD-10-CM | POA: Diagnosis not present

## 2024-02-19 MED ORDER — PREGABALIN 50 MG PO CAPS: 50.0000 mg | ORAL_CAPSULE | Freq: Three times a day (TID) | ORAL | 2 refills | Status: DC

## 2024-02-19 NOTE — Patient Instructions (Addendum)
 ALWAYS FEEL FREE TO CALL OUR OFFICE WITH ANY PROBLEMS OR QUESTIONS [phone])  **PLEASE NOTE** ALL MEDICATION REFILL REQUESTS (INCLUDING CONTROLLED SUBSTANCES) NEED TO BE MADE AT LEAST 7 DAYS PRIOR TO REFILL BEING DUE. ANY REFILL REQUESTS INSIDE THAT TIME FRAME MAY RESULT IN DELAYS IN RECEIVING YOUR PRESCRIPTION.     ?USE SUPPOSITORY BEFORE YOU EMPTY YOUR BOWELS TO HELP YOU GO MORE COMPLETELY   -CONSIDER REGULAR SENNA OR MIRALAX  POWDER DAILY AS WELL   YOU MAY TRY DRIVING IN AN EMPTY PARKING LOT OR IN YOUR NEIGHBORHOOD IN THE EARLY MORNING UNDER SUPERVISION ONLY.     LYRICA : TAKE 50MG  2X DAILY FOR ONE WEEK THEN 3X DAILY.

## 2024-02-19 NOTE — Progress Notes (Signed)
 Subjective:    Patient ID: Travis Fitzpatrick, male    DOB: [birth year], 43 y.o.   MRN: [id number]  HPI: Travis Fitzpatrick is back regarding his thoracic myelopathy and associated functional deficits.  He remains active with physical therapy on outpatient basis. He has noticed some numbness in his right thumb and pointer finger and is uncomfortable. Symptoms have been present for about 4 mos. He denies any symptoms in his head, neck or upper arm.  He is right-handed.  He continues have pain in his back and lower legs.  Pain ranges from a 2-5 out of 10 usually.  Gabapentin  seems to have helped to a certain extent along with his diclofenac  and other medications he is currently taking.  He is using his rolling walker for gait.  He seems to feel that he has been improving.  He has interest in driving again.  He has some concerns about cost of a formal driving evaluation.  From a bowel standpoint he is moving his bowels daily.  He does have some incontinence at times between his movements.  He does not always feel that he is empty.  He is not using a suppository as we discussed at last visit.  He is taken Senokot in the past with some results.   Pain Inventory Average  Pain 2 Pain Right Now 3 My pain is stabbing and tingling  In the last 24 hours, has pain interfered with the following? General activity 6 Relation with others 5 Enjoyment of life 5 What TIME of day is your pain at its worst? morning , evening, and night Sleep (in general) Fair  Pain is worse with: walking, bending, sitting, standing, and some activites Pain improves with: rest, medication, and TENS Relief from Meds: 4  Family History  Problem Relation Age of Onset   Hypertension Father    Social History   Socioeconomic History   Marital status: Single    Spouse name: Not on file   Number of children: Not on file   Years of education: Not on file   Highest education level: Not on file  Occupational History   Not on file   Tobacco Use   Smoking status: Never   Smokeless tobacco: Never  Vaping Use   Vaping status: Never Used  Substance and Sexual Activity   Alcohol use: No    Alcohol/week: 0.0 standard drinks of alcohol   Drug use: No   Sexual activity: Never  Other Topics Concern   Not on file  Social History Narrative   Not on file   Social Drivers of Health   Financial Resource Strain: Medium Risk (08/30/2022)   Received from [facility]   Overall Financial Resource Strain (CARDIA)    Difficulty of Paying Living Expenses: Somewhat hard  Food Insecurity: Food Insecurity Present (08/30/2022)   Received from [facility]   Hunger Vital Sign    Within the past 12 months, you worried that your food would run out before you got the money to buy more.: Never true    Within the past 12 months, the food you bought just didn't last and you didn't have money to get more.: Sometimes true  Transportation Needs: No Transportation Needs (01/25/2023)   Received from [facility]   PRAPARE - Transportation    Lack of Transportation (Medical): No    Lack of Transportation (Non-Medical): No  Physical Activity: Not on file  Stress: Not on file  Social Connections: Not on  file   Past Surgical History:  Procedure Laterality Date   BACK SURGERY  2010   CIRCUMCISION     LUMBAR LAMINECTOMY/DECOMPRESSION MICRODISCECTOMY  07/20/2011   Procedure: LUMBAR LAMINECTOMY/DECOMPRESSION MICRODISCECTOMY;  Surgeon: Rockey LITTIE Peru;  Location: [facility];  Service: Neurosurgery;  Laterality: N/A;  right thoracotomy with thoracic eight-nine discectomy and fusion   LUMBAR LAMINECTOMY/DECOMPRESSION MICRODISCECTOMY Right 10/06/2021   Procedure: Right Lumbar Four-Five Microdiscectomy, Right Lumbar Five-Sacal One Foraminotomy;  Surgeon: [place] Rockey, MD;  Location: [facility];  Service: Neurosurgery;  Laterality: Right;  3C/RM 21   THORACIC DISCECTOMY  07/16/2012   Procedure: THORACIC DISCECTOMY;   Surgeon: Rockey LITTIE Peru, MD;  Location: [facility];  Service: Neurosurgery;  Laterality: Right;  RIGHT Thoracic seven-eight  thoracic diskectomy via thoracotomy by dr lucas   THORACIC DISCECTOMY N/A 12/15/2014   Procedure: THORACIC SEVEN TO THORACIC NINE Laminectomy ;  Surgeon: Rockey Peru, MD;  Location: [facility];  Service: Neurosurgery;  Laterality: N/A;   THORACIC DISCECTOMY N/A 06/06/2017   Procedure: LAMINECTOMY THORACIC NINE-TEN;  Surgeon: [place] Rockey, MD;  Location: [facility];  Service: Neurosurgery;  Laterality: N/A;  LAMINECTOMY THORACIC NINE-TEN   THORACOTOMY  07/20/2011   Procedure: THORACOTOMY OPEN FOR SPINE SURGERY;  Surgeon: JONETTA Belvie Nam, MD;  Location: [facility];  Service: Vascular;  Laterality: N/A;   THORACOTOMY  07/16/2012   Procedure: THORACOTOMY OPEN FOR SPINE SURGERY;  Surgeon: Dorise MARLA lucas, MD;  Location: [facility];  Service: Thoracic;  Laterality: N/A;   Past Surgical History:  Procedure Laterality Date   BACK SURGERY  2010   CIRCUMCISION     LUMBAR LAMINECTOMY/DECOMPRESSION MICRODISCECTOMY  07/20/2011   Procedure: LUMBAR LAMINECTOMY/DECOMPRESSION MICRODISCECTOMY;  Surgeon: Rockey LITTIE Peru;  Location: [facility];  Service: Neurosurgery;  Laterality: N/A;  right thoracotomy with thoracic eight-nine discectomy and fusion   LUMBAR LAMINECTOMY/DECOMPRESSION MICRODISCECTOMY Right 10/06/2021   Procedure: Right Lumbar Four-Five Microdiscectomy, Right Lumbar Five-Sacal One Foraminotomy;  Surgeon: [place] Rockey, MD;  Location: [facility];  Service: Neurosurgery;  Laterality: Right;  3C/RM 21   THORACIC DISCECTOMY  07/16/2012   Procedure: THORACIC DISCECTOMY;  Surgeon: Rockey LITTIE Peru, MD;  Location: [facility];  Service: Neurosurgery;  Laterality: Right;  RIGHT Thoracic seven-eight  thoracic diskectomy via thoracotomy by dr lucas   THORACIC DISCECTOMY N/A 12/15/2014   Procedure: THORACIC SEVEN TO THORACIC NINE Laminectomy ;  Surgeon: Rockey [place], MD;  [facility]: [facility];  Service: Neurosurgery;  Laterality: N/A;   THORACIC DISCECTOMY N/A 06/06/2017   Procedure: LAMINECTOMY THORACIC NINE-TEN;  Surgeon: [place] Rockey, MD;  Location: [facility];  Service: Neurosurgery;  Laterality: N/A;  LAMINECTOMY THORACIC NINE-TEN   THORACOTOMY  07/20/2011   Procedure: THORACOTOMY OPEN FOR SPINE SURGERY;  Surgeon: JONETTA Belvie Nam, MD;  Location: [facility];  Service: Vascular;  Laterality: N/A;   THORACOTOMY  07/16/2012   Procedure: THORACOTOMY OPEN FOR SPINE SURGERY;  Surgeon: Dorise MARLA lucas, MD;  Location: [facility];  Service: Thoracic;  Laterality: N/A;   Past Medical History:  Diagnosis Date   Bowel trouble    urgency   Medical history non-contributory    Urinary urgency    BP 127/79 (BP Location: Left Arm, Patient Position: Sitting, Cuff Size: Large)   Pulse (!) 108   Ht 5' 10 (1.778 m)   Wt (!) 314 lb 12.8 oz (142.8 kg)   SpO2 98%   BMI 45.17 kg/m   Opioid Risk Score:   Fall Risk  Score:  `1  Depression screen PHQ 2/9     03/13/2023    1:10 PM 12/19/2022    2:41 PM 12/05/2022   10:16 AM 06/28/2021    9:29 AM 03/29/2021    9:39 AM 01/11/2021   10:19 AM  Depression screen PHQ 2/9  Decreased Interest 0 0 0 0 1 3  Down, Depressed, Hopeless 0 0 0 0 1 1  PHQ - 2 Score 0 0 0 0 2 4  Altered sleeping      3  Tired, decreased energy      3  Change in appetite      3  Feeling bad or failure about yourself       0  Trouble concentrating      2  Moving slowly or fidgety/restless      1  Suicidal thoughts      0  PHQ-9 Score      16      Review of Systems  Genitourinary:        Groin pain  Musculoskeletal:  Positive for myalgias.       Bilateral leg pain  Neurological:  Positive for numbness.       Finger and thumb numbness  All other systems reviewed and are negative.      Objective:   Physical Exam General: Alert and oriented x 3, No apparent distress HEENT: Head is normocephalic, atraumatic, PERRLA, EOMI, sclera anicteric, oral mucosa pink and  moist, dentition intact, ext ear canals clear,  Neck: Supple without JVD or lymphadenopathy Heart: Reg rate and rhythm. No murmurs rubs or gallops Chest: CTA bilaterally without wheezes, rales, or rhonchi; no distress Abdomen: Soft, non-tender, non-distended, bowel sounds positive. Extremities: No clubbing, cyanosis, or edema. Pulses are 2+ Psych: Pt's affect is appropriate. Pt is cooperative Skin: Clean and intact without signs of breakdown Neuro: Alert and oriented x 3. Normal insight and awareness. Intact Memory. Normal language and speech. Cranial nerve exam unremarkable. MMT: BUE 5/5. RLE HF 2l, KE 2, ADF/PF 1. LLE 3 to 3+HF, KE and 3 to 3+/5 ADF/PF. Decreased sensation below mid abdomen but does sense general touch.  Left knee hyperextends and hip flexes during stance and gait. Tinel's and Phalen's +. No atrophy. Sensory loss along median half of hand. Spurling's equivocal.  Musculoskeletal: no jt pain.           Assessment & Plan:  1. Hx of thoracic myelopathy with residual sensory deficits from trunk down.  He has a T8 sensory level on exam 2.  Lumbar spondylosis/ddd s/p decompression and fusion  3.  Right hip pain concerning for OA 4. See no sign of neurogenic bowel or bladder. Pt does have back pain which he pushes to empty his bowels on toilet. 5. Morbid obesity 6. Adhesive arachnoiditis.  7. Right CTS   Plan: 1.  Maintain gabapentin  to 600 mg qid. Begin trial of lyrica  50mg  bid to tid 2.  Maintain nortriptyline  to 25mg  qhs 2.  Continue diclofenac  75 mg twice daily with food.   3.  Zynex Nexwave device for low back and buttock pain 4.  maintains outpt therapy to address LE strengthn rom, pain, etc. Dicussed the importance of staying active.   -may try driving with wife in empty parking lot or in neighborhood in morning.  5.  continue cymbalta  to 90mg  daily. 6.  -suppository for helping empty bowels completely. He's not too keen on that though.  7. continue baclofen  10mg  bid  for spasms.  This helps 8. Will refer for RUE CTS and radic screen      20+ minutes of phone time were spent during this visit. All questions were encouraged and answered. Follow up for EMG/NCS as scheduled.

## 2024-02-25 ENCOUNTER — Ambulatory Visit

## 2024-02-25 DIAGNOSIS — M6281 Muscle weakness (generalized): Secondary | ICD-10-CM

## 2024-02-25 DIAGNOSIS — M5459 Other low back pain: Secondary | ICD-10-CM

## 2024-02-25 DIAGNOSIS — R262 Difficulty in walking, not elsewhere classified: Secondary | ICD-10-CM

## 2024-02-25 DIAGNOSIS — R29818 Other symptoms and signs involving the nervous system: Secondary | ICD-10-CM

## 2024-02-25 DIAGNOSIS — R296 Repeated falls: Secondary | ICD-10-CM

## 2024-02-25 DIAGNOSIS — M5416 Radiculopathy, lumbar region: Secondary | ICD-10-CM

## 2024-02-25 NOTE — Therapy (Signed)
 OUTPATIENT PHYSICAL THERAPY TREATMENT   Patient Name: Travis Fitzpatrick MRN: [id number] DOB:15-Apr-1981, 43 y.o., male Today's Date: 02/25/24   END OF SESSION:  PT End of Session - 02/25/24 1656     Visit Number 76    Number of Visits 93    Date for PT Re-Evaluation 04/15/24    Authorization Type MEDICARE PART B reporting period from 01/22/2024    Progress Note Due on Visit 80    PT Start Time 1650    PT Stop Time 1728    PT Time Calculation (min) 38 min    Equipment Utilized During Treatment Gait belt    Activity Tolerance Patient limited by pain;Patient tolerated treatment well;Patient limited by fatigue    Behavior During Therapy [facility] for tasks assessed/performed          Past Medical History:  Diagnosis Date   Bowel trouble    urgency   Medical history non-contributory    Urinary urgency    Past Surgical History:  Procedure Laterality Date   BACK SURGERY  2010   CIRCUMCISION     LUMBAR LAMINECTOMY/DECOMPRESSION MICRODISCECTOMY  07/20/2011   Procedure: LUMBAR LAMINECTOMY/DECOMPRESSION MICRODISCECTOMY;  Surgeon: Rockey LITTIE Peru;  Location: [facility];  Service: Neurosurgery;  Laterality: N/A;  right thoracotomy with thoracic eight-nine discectomy and fusion   LUMBAR LAMINECTOMY/DECOMPRESSION MICRODISCECTOMY Right 10/06/2021   Procedure: Right Lumbar Four-Five Microdiscectomy, Right Lumbar Five-Sacal One Foraminotomy;  Surgeon: Peru Rockey, MD;  Location: [facility];  Service: Neurosurgery;  Laterality: Right;  3C/RM 21   THORACIC DISCECTOMY  07/16/2012   Procedure: THORACIC DISCECTOMY;  Surgeon: Rockey LITTIE Peru, MD;  Location: [facility];  Service: Neurosurgery;  Laterality: Right;  RIGHT Thoracic seven-eight  thoracic diskectomy via thoracotomy by dr lucas   THORACIC DISCECTOMY N/A 12/15/2014   Procedure: THORACIC SEVEN TO THORACIC NINE Laminectomy ;  Surgeon: Rockey Peru, MD;  Location: [facility];  Service: Neurosurgery;  Laterality: N/A;   THORACIC DISCECTOMY N/A  06/06/2017   Procedure: LAMINECTOMY THORACIC NINE-TEN;  Surgeon: [place] Rockey, MD;  Location: [facility];  Service: Neurosurgery;  Laterality: N/A;  LAMINECTOMY THORACIC NINE-TEN   THORACOTOMY  07/20/2011   Procedure: THORACOTOMY OPEN FOR SPINE SURGERY;  Surgeon: JONETTA Belvie Nam, MD;  Location: [facility];  Service: Vascular;  Laterality: N/A;   THORACOTOMY  07/16/2012   Procedure: THORACOTOMY OPEN FOR SPINE SURGERY;  Surgeon: Dorise MARLA lucas, MD;  Location: [facility];  Service: Thoracic;  Laterality: N/A;   Patient Active Problem List   Diagnosis Date Noted   Carpal tunnel syndrome on right 02/19/2024   Spasticity 09/25/2023   Nerve pain 12/05/2022   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   S/P lumbar spinal fusion 05/25/2022   Synovial cyst of lumbar facet joint 05/25/2022   HNP (herniated nucleus pulposus), lumbar 10/06/2021   Hand pain 06/28/2021   Lumbar facet arthropathy 01/11/2021   Thoracic spondylosis with myelopathy 01/11/2021   Thoracic spinal stenosis 06/06/2017   Stenosis, spinal, thoracic 12/15/2014   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 09/22/2014   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/2011    PCP: Maryl Sayres  REFERRING PROVIDER: Babs Arthea DASEN, MD  REFERRING DIAG: thoracic disc disease with myelopathy, nerve pain, adhesive arachnoiditis  Rationale for Evaluation and Treatment: Rehabilitation  THERAPY DIAG:  Other low back pain  Other symptoms and signs involving the nervous system  Radiculopathy, lumbar region  Muscle weakness (generalized)  Repeated falls  Difficulty in walking, not elsewhere classified  ONSET DATE:  Suddenly started having weakness 11 years ago, most recent episode of worsening October 2022, s/p right L4-5 laminotomy, microdiscectomy on 10/06/2021, now s/p L4-5 PLIF by Dr. Gillie on 05/25/2022.  PERTINENT HISTORY:  Patient is a 43 y.o. male who presents to outpatient physical therapy with a  referral for medical diagnosis thoracic disc disease with myelopathy, nerve pain, adhesive arachnoiditis. This patient's chief complaints consist of disabling low back and R >L leg pain and weakness with activity, bowel and bladder urgency, incontinence, and retention, leading to the following functional deficits: difficulty with or unable to complete any activity that requires weight bearing, use of B LE, and/or balance including working, household and community mobility, walking, driving, going to family gatherings, avoiding incontinent episodes, playing with daughter, helping around the house, bed mobility, transfers. 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 Gillie on 05/25/2022, adhesive arachnoiditis, nerve pain.  Patient denies hx of cancer, stroke, seizures, lung problems, heart problems, diabetes, unexplained weight loss, and osteoporosis.  SUBJECTIVE:                                                                                                                                                                                           SUBJECTIVE STATEMENT:     Pt saw doctor last week, says he needs to go for EMG study to determine issues on RUE/ pollux numbness. No additional updates. Pt still having GI hyperfunction with lyrica  and gabapentin  combination.   PAIN:  NPRS:  2/10 mostly in low back, right groin/hip, bilateral posterior thighs, right calf.   PRECAUTIONS: Fall  PATIENT GOALS: To get better, walk, drive again, climb stairs, play with my daughter, help around the  house again  OBJECTIVE  TREATMENT 02/25/24   *pt declines AMB overground due to weather related BLE weakness/discomfort  -Seated overhead pull down (B shoulder extension from ~120 degrees flexion to bar on thighs) with cable + chain 1x20 at 25#  -Seated chest press c short barbell (23#) 1x15 -Seated overhead pull down (B shoulder extension from ~120 degrees flexion to bar on thighs) with cable + chain 1x20 at 25#  -Seated chest press c short barbell (23#) 1x15 -Seated overhead pull down (B shoulder extension from ~120 degrees flexion to bar on thighs) with cable +  chain 1x20 at 25#  -Seated chest press c short barbell (23#) 1x15  -75lb sled push 1x41ft  *seated rest  -75lb sled push 1x31ft  *seated rest  -75lb sled push 1x56ft  *seated rest  -75lb sled push 1x29ft  *seated rest    PATIENT EDUCATION:  Education details: Exercise purpose/form. Self management techniques. POC.  Person educated: patient Education method: Explanation Education comprehension: verbalized understanding and needs further education  HOME EXERCISE PROGRAM: Verbally:  - seated lumbar flexion stretch with head down and one LE extended using rollator, 1x10 each side.  - curl up with legs elevated, 5x15 seconds - sidelying open book 1x10 each side  Access Code: [email or web address] URL: [email or web address] Date: 12/18/2023 Prepared by: Camie Cleverly  Exercises - Seated Diaphragmatic Breathing  - 3 reps - 1 minute practice time - Seated Pelvic Floor Elevators  - 3 x daily - 2 sets - 10 reps - Seated Ankle Plantar Flexion with Resistance Loop  - 1 x daily - 3 sets - 20 reps - Seated Ankle Dorsiflexion AROM  - 1 x daily - 3 sets - 10-30 reps - Seated Ankle Inversion AROM  - 1 x daily - 3 sets - 10-30 reps - Seated Ankle Eversion AROM  - 1 x daily - 3 sets - 10-30 reps - Sidelying Ankle Eversion Strengthening with Ankle Weight  - 1 x daily - 3 sets - 20 reps - Sidelying Ankle Inversion with Ankle  Weight  - 1 x daily - 3 sets - 20 reps - Seated Ankle Dorsiflexion with Ankle Weight  - 1 x daily - 3 sets - 20 reps  ASSESSMENT:  CLINICAL IMPRESSION:     Continued with well established loading program ti advance strength and core motor control. Pt takes recovery breaks as needed, manages pain flares with stretching ad lib. Patient will benefit from continued management of limiting condition by skilled physical therapist to address remaining impairments and functional limitations to work towards stated goals and return to PLOF or maximal functional independence.   OBJECTIVE IMPAIRMENTS: Abnormal gait, decreased activity tolerance, decreased balance, decreased coordination, decreased endurance, decreased knowledge of condition, decreased knowledge of use of DME, decreased mobility, difficulty walking, decreased ROM, decreased strength, increased edema, impaired perceived functional ability, increased muscle spasms, impaired flexibility, impaired sensation, impaired tone, improper body mechanics, postural dysfunction, obesity, and pain.   ACTIVITY LIMITATIONS: carrying, lifting, bending, standing, squatting, stairs, transfers, bed mobility, continence, bathing, toileting, dressing, hygiene/grooming, locomotion level, and caring for others  PARTICIPATION LIMITATIONS: meal prep, cleaning, laundry, interpersonal relationship, driving, shopping, community activity, occupation, yard work, and  difficulty with or unable to complete any activity that requires weight bearing, use of B LE, and/or balance including working, household and community mobility, walking, driving, going to family gatherings, avoiding incontinent episodes, playing with daughter, helping around the house, bed mobility, transfers  PERSONAL FACTORS: Past/current experiences, Time since onset of injury/illness/exacerbation, and 3+ comorbidities:  3 thoracic spine surgeries, thoracic MRI notes Myelomalacia with severe cord atrophy from T7  through T9-10 and mild decreased volume the remainder of the thoracic cord, stable s/p right L4-5 laminotomy, microdiscectomy on 10/06/2021, 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 Gillie on 05/25/2022, adhesive arachnoiditis, nerve pain are also affecting patient's functional outcome.   REHAB POTENTIAL: Fair due to severity and nature of condition.   CLINICAL DECISION MAKING: Evolving/moderate complexity  EVALUATION COMPLEXITY: Moderate  GOALS: Goals reviewed with patient? No  SHORT TERM GOALS: Target date: 01/09/2023. Target date updated to 05/28/2023 for all unmet goals on 03/05/2023.   Patient will be independent with initial home exercise program for self-management of symptoms. Baseline: Initial HEP to be provided at visit 2 as appropriate (12/26/22); Goal status: MET  LONG TERM GOALS: Target date: 03/20/2023. Target date updated to 08/26/23  for all unmet goals on 06/03/2023. Target date updated to 11/20/2023 for all  unmet goals on 08/28/2023. Target date updated to 02/17/2024  for all unmet goals on 11/25/2023. Target date updated to 04/14/2024 for all unmet goals on 01/22/2024.   Patient will be independent with a long-term home exercise program for self-management of symptoms.  Baseline: Initial HEP to be provided at visit 2 as appropriate (12/26/22); participating as able (01/31/2023); patient currently participating as tolerated (03/05/2023; 04/09/2023; 06/03/2023); participates daily in mat exercises and in weight bearing exercises as tolerated (10/08/2023; 11/25/2023); Tries to do daily but sometimes cannot when his body won't let him (01/22/2024);  Goal status: In-progress  2.  Patient will demonstrate improved FOTO by equal or greater than 10 points by visit #13 to demonstrate improvement in overall condition and self-reported functional ability.  Baseline:  to be tested visit 2 as appropriate (12/26/22); 30 at visit #3 (01/02/2023); 46 at visit #13 (03/05/2023); 45 at visit #29 (06/03/2023); 49 at visit #50 (10/08/2023);  Goal status: MET  3.  Patient will demonstrate the ability to ambulate equal or greater than 600 feet with LRAD during the 6 minute walk to improve his household and community mobility.  Baseline: 34 feet with bari-RW (12/26/22); 200 feet with bari-RW (01/31/2023); 500 feet with BRW (03/05/2023); 400 feet with BRW (04/09/2023); 152 feet with BRW and W/C follow for safety (06/03/2023); 330 feet with BRW (10/08/2023); 245 feet with BRW and SBA for safety (11/25/2023); 343 feet with BRW and SBA for safety (01/22/2024);  Goal status: In progress  4.  Patient will complete 5 Time Sit To Stand Test from 19.5 inch surface or lower in equal or less than 15 seconds with no UE support to demonstrate improved transfer ability for improved household and toileting mobility.  Baseline: 23 seconds with heavy B UE support on RW from 19.5 inch plinth. Pain throbbing down the right LE.  (12/26/22); 19 seconds with heavy B UE support on RW from 19.5 inch plinth (03/05/2023); 20 seconds with heavy B UE support on RRW from 18.5 inch plinth. Painful in back of B calves, thighs, and lower back (04/09/2023); 15 seconds with heavy B UE support on BRW from 18.5 inch plinth. Painful in back of B calves, thighs, and lower back (06/03/2023); 19 seconds with heavy B UE support on BRW from 18.5 inch plinth. Painful in back of B calves, thighs, and lower back (10/08/2023); 18 seconds with heavy B UE support on BRW from 18.5 inch plinth. Painful in back of B calves, thighs, and lower back (11/25/2023); 15seconds with heavy B UE support on BRW from 18.5 inch plinth. Painful in back of B calves, thighs, and lower back (01/22/2024);  Goal status: Progressing   5.  Patient will report worst pain equal or less than 3/10 with functional activities to improve his ability to complete basic  household and community mobility. Baseline: up to 8/10 (12/26/22); reports 3/10 pain (01/31/2023); reports 4/10 pain (02/19/2023); up to 7/10 in the last 2 weeks (03/05/2023): up to  7/10 in the last two weeks (04/09/2023); up to 8/10 in the  last 2 weeks (06/03/2023);  up to 9/10 over the last 2 weeks (10/08/2023); up to 7-8/10 in the last 2 weeks (11/25/2023); 7/10 in the last 2 weeks (01/22/2024);  Goal status: Ongoing  6.  Patient will demonstrate improvement in Patient Specific Functional Scale (PSFS) by equal or greater than 3/10 points to reflect clinically significant improvement in patient's most valued functional activities. Baseline: 1.7/10 for Walking Around [address], Enjoying Life, and Driving (97/74/74); 7.2/89 (11/25/2023); 2.7/10 (01/22/24);  Goal status: progressing  PLAN:  PT FREQUENCY: 1-2x/week PT DURATION: 12 weeks PLANNED INTERVENTIONS: Therapeutic exercises, Therapeutic activity, Neuromuscular re-education, Balance training, Gait training, Patient/Family education, Self Care, Joint mobilization, Stair training, Orthotic/Fit training, DME instructions, Aquatic Therapy, Dry Needling, Electrical stimulation, Wheelchair mobility training, Spinal mobilization, Cryotherapy, Moist heat, Manual therapy, and Re-evaluation.  PLAN FOR NEXT SESSION:  update HEP as appropriate, neurodynamics, gait training, LE/core/functional strengthening, stretching, and balance as tolerated, education, manual therapy as needed. Pelvic floor.   5:01 PM, 02/25/24 Peggye JAYSON Linear, PT, DPT Physical Therapist - [place] [phone] (Office)  [facility] [address] [place], [place] [address] P: [phone] I F: [phone]

## 2024-02-27 ENCOUNTER — Ambulatory Visit: Admitting: Physical Therapy

## 2024-03-02 ENCOUNTER — Encounter: Payer: Self-pay | Admitting: Physical Therapy

## 2024-03-02 ENCOUNTER — Ambulatory Visit

## 2024-03-02 DIAGNOSIS — R29818 Other symptoms and signs involving the nervous system: Secondary | ICD-10-CM

## 2024-03-02 DIAGNOSIS — G8929 Other chronic pain: Secondary | ICD-10-CM

## 2024-03-02 DIAGNOSIS — R296 Repeated falls: Secondary | ICD-10-CM

## 2024-03-02 DIAGNOSIS — R262 Difficulty in walking, not elsewhere classified: Secondary | ICD-10-CM

## 2024-03-02 DIAGNOSIS — R2689 Other abnormalities of gait and mobility: Secondary | ICD-10-CM

## 2024-03-02 DIAGNOSIS — M5459 Other low back pain: Secondary | ICD-10-CM | POA: Diagnosis not present

## 2024-03-02 DIAGNOSIS — M546 Pain in thoracic spine: Secondary | ICD-10-CM

## 2024-03-02 DIAGNOSIS — M6281 Muscle weakness (generalized): Secondary | ICD-10-CM

## 2024-03-02 DIAGNOSIS — M5416 Radiculopathy, lumbar region: Secondary | ICD-10-CM

## 2024-03-02 NOTE — Therapy (Signed)
 OUTPATIENT PHYSICAL THERAPY TREATMENT   Patient Name: Travis Fitzpatrick MRN: [id number] DOB:February 14, 1981, 43 y.o., male Today's Date: 03/02/24   END OF SESSION:  PT End of Session - 03/02/24 1607     Visit Number 77    Number of Visits 93    Date for PT Re-Evaluation 04/15/24    Authorization Type MEDICARE PART B reporting period from 01/22/2024    Progress Note Due on Visit 80    PT Start Time 1606    PT Stop Time 1645    PT Time Calculation (min) 39 min    Equipment Utilized During Treatment Gait belt    Activity Tolerance Patient limited by pain;Patient tolerated treatment well;Patient limited by fatigue    Behavior During Therapy [facility] for tasks assessed/performed          Past Medical History:  Diagnosis Date   Bowel trouble    urgency   Medical history non-contributory    Urinary urgency    Past Surgical History:  Procedure Laterality Date   BACK SURGERY  2010   CIRCUMCISION     LUMBAR LAMINECTOMY/DECOMPRESSION MICRODISCECTOMY  07/20/2011   Procedure: LUMBAR LAMINECTOMY/DECOMPRESSION MICRODISCECTOMY;  Surgeon: Rockey LITTIE Peru;  Location: [facility];  Service: Neurosurgery;  Laterality: N/A;  right thoracotomy with thoracic eight-nine discectomy and fusion   LUMBAR LAMINECTOMY/DECOMPRESSION MICRODISCECTOMY Right 10/06/2021   Procedure: Right Lumbar Four-Five Microdiscectomy, Right Lumbar Five-Sacal One Foraminotomy;  Surgeon: [place] Rockey, MD;  Location: [facility];  Service: Neurosurgery;  Laterality: Right;  3C/RM 21   THORACIC DISCECTOMY  07/16/2012   Procedure: THORACIC DISCECTOMY;  Surgeon: Rockey LITTIE Peru, MD;  Location: [facility];  Service: Neurosurgery;  Laterality: Right;  RIGHT Thoracic seven-eight  thoracic diskectomy via thoracotomy by dr lucas   THORACIC DISCECTOMY N/A 12/15/2014   Procedure: THORACIC SEVEN TO THORACIC NINE Laminectomy ;  Surgeon: Rockey Peru, MD;  Location: [facility];  Service: Neurosurgery;  Laterality: N/A;   THORACIC DISCECTOMY N/A  06/06/2017   Procedure: LAMINECTOMY THORACIC NINE-TEN;  Surgeon: [place] Rockey, MD;  Location: [facility];  Service: Neurosurgery;  Laterality: N/A;  LAMINECTOMY THORACIC NINE-TEN   THORACOTOMY  07/20/2011   Procedure: THORACOTOMY OPEN FOR SPINE SURGERY;  Surgeon: JONETTA Belvie Nam, MD;  Location: [facility];  Service: Vascular;  Laterality: N/A;   THORACOTOMY  07/16/2012   Procedure: THORACOTOMY OPEN FOR SPINE SURGERY;  Surgeon: Dorise MARLA lucas, MD;  Location: [facility];  Service: Thoracic;  Laterality: N/A;   Patient Active Problem List   Diagnosis Date Noted   Carpal tunnel syndrome on right 02/19/2024   Spasticity 09/25/2023   Nerve pain 12/05/2022   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   S/P lumbar spinal fusion 05/25/2022   Synovial cyst of lumbar facet joint 05/25/2022   HNP (herniated nucleus pulposus), lumbar 10/06/2021   Hand pain 06/28/2021   Lumbar facet arthropathy 01/11/2021   Thoracic spondylosis with myelopathy 01/11/2021   Thoracic spinal stenosis 06/06/2017   Stenosis, spinal, thoracic 12/15/2014   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 09/22/2014   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/2011    PCP: Maryl Sayres  REFERRING PROVIDER: Babs Arthea DASEN, MD  REFERRING DIAG: thoracic disc disease with myelopathy, nerve pain, adhesive arachnoiditis  Rationale for Evaluation and Treatment: Rehabilitation  THERAPY DIAG:  Other low back pain  Other symptoms and signs involving the nervous system  Radiculopathy, lumbar region  Muscle weakness (generalized)  Repeated falls  Difficulty in walking, not elsewhere classified  Other abnormalities of gait and mobility  Pain in thoracic spine  Chronic bilateral low back pain with bilateral sciatica  ONSET DATE:  Suddenly started having weakness 11 years ago, most recent episode of worsening October 2022, s/p right L4-5 laminotomy, microdiscectomy on 10/06/2021, now s/p L4-5 PLIF by  Dr. Gillie on 05/25/2022.  PERTINENT HISTORY:  Patient is a 43 y.o. male who presents to outpatient physical therapy with a referral for medical diagnosis thoracic disc disease with myelopathy, nerve pain, adhesive arachnoiditis. This patient's chief complaints consist of disabling low back and R >L leg pain and weakness with activity, bowel and bladder urgency, incontinence, and retention, leading to the following functional deficits: difficulty with or unable to complete any activity that requires weight bearing, use of B LE, and/or balance including working, household and community mobility, walking, driving, going to family gatherings, avoiding incontinent episodes, playing with daughter, helping around the house, bed mobility, transfers. 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 Gillie on 05/25/2022, adhesive arachnoiditis, nerve pain.  Patient denies hx of cancer, stroke, seizures, lung problems, heart problems, diabetes, unexplained weight loss, and osteoporosis.  SUBJECTIVE:                                                                                                                                                                                           SUBJECTIVE STATEMENT:     Pt reports improved GI symptoms. Pain currently 2/10 NPS. More discomfort in Lower legs than pain.   PAIN:  NPRS:  2/10 mostly in low back, right groin/hip, bilateral posterior thighs, right calf.   PRECAUTIONS: Fall  PATIENT GOALS: To get better, walk, drive again, climb stairs, play with my  daughter, help around the house again  OBJECTIVE  TREATMENT 03/02/24   -Seated overhead pull down (B shoulder extension from ~120 degrees flexion to bar on thighs) with cable + chain 1x20 at 25#  -Seated chest press c short barbell (23#) 1x15 -Seated overhead pull down (B shoulder extension from ~120 degrees flexion to bar on thighs) with cable + chain 1x20 at 25#  -Seated chest press c short barbell (23#) 1x15 -Seated overhead pull down (B shoulder extension from ~120 degrees flexion to bar on thighs) with cable + chain 1x20 at 25#  -Seated chest  press c short barbell (23#) 1x15  -75lb sled push 1x65ft  *seated rest  -75lb sled push 1x14ft  *seated rest  -75lb sled push 1x41ft  *seated rest  -75lb sled push 1x85ft  *seated rest   STS from mat table: 2x10, 1x5 at BRW    PATIENT EDUCATION:  Education details: Exercise purpose/form. Self management techniques. POC.  Person educated: patient Education method: Explanation Education comprehension: verbalized understanding and needs further education  HOME EXERCISE PROGRAM: Verbally:  - seated lumbar flexion stretch with head down and one LE extended using rollator, 1x10 each side.  - curl up with legs elevated, 5x15 seconds - sidelying open book 1x10 each side  Access Code: [email or web address] URL: [email or web address] Date: 12/18/2023 Prepared by: Camie Cleverly  Exercises - Seated Diaphragmatic Breathing  - 3 reps - 1 minute practice time - Seated Pelvic Floor Elevators  - 3 x daily - 2 sets - 10 reps - Seated Ankle Plantar Flexion with Resistance Loop  - 1 x daily - 3 sets - 20 reps - Seated Ankle Dorsiflexion AROM  - 1 x daily - 3 sets - 10-30 reps - Seated Ankle Inversion AROM  - 1 x daily - 3 sets - 10-30 reps - Seated Ankle Eversion AROM  - 1 x daily - 3 sets - 10-30 reps - Sidelying Ankle Eversion Strengthening with Ankle Weight  - 1 x daily - 3 sets - 20 reps - Sidelying Ankle Inversion with Ankle Weight  - 1  x daily - 3 sets - 20 reps - Seated Ankle Dorsiflexion with Ankle Weight  - 1 x daily - 3 sets - 20 reps  ASSESSMENT:  CLINICAL IMPRESSION:     Continuing PT POC working on progressive LE and back/core strengthening. Continues to need seated rest and intermittent stretching to manage pain flare ups. Able to progress some resistance today with shoulder extension exercise. Patient will benefit from continued management of limiting condition by skilled physical therapist to address remaining impairments and functional limitations to work towards stated goals and return to PLOF or maximal functional independence.   OBJECTIVE IMPAIRMENTS: Abnormal gait, decreased activity tolerance, decreased balance, decreased coordination, decreased endurance, decreased knowledge of condition, decreased knowledge of use of DME, decreased mobility, difficulty walking, decreased ROM, decreased strength, increased edema, impaired perceived functional ability, increased muscle spasms, impaired flexibility, impaired sensation, impaired tone, improper body mechanics, postural dysfunction, obesity, and pain.   ACTIVITY LIMITATIONS: carrying, lifting, bending, standing, squatting, stairs, transfers, bed mobility, continence, bathing, toileting, dressing, hygiene/grooming, locomotion level, and caring for others  PARTICIPATION LIMITATIONS: meal prep, cleaning, laundry, interpersonal relationship, driving, shopping, community activity, occupation, yard work, and  difficulty with or unable to complete any activity that requires weight bearing, use of B LE, and/or balance including working, household and community mobility, walking, driving, going to family gatherings, avoiding incontinent episodes, playing with daughter, helping around the house, bed mobility, transfers  PERSONAL FACTORS: Past/current experiences, Time since onset of injury/illness/exacerbation, and 3+ comorbidities:  3 thoracic spine surgeries, thoracic MRI notes  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 Gillie on 05/25/2022, adhesive arachnoiditis, nerve pain are also affecting patient's functional outcome.   REHAB POTENTIAL: Fair due to severity and nature of condition.  CLINICAL DECISION MAKING: Evolving/moderate complexity  EVALUATION COMPLEXITY: Moderate   GOALS: Goals reviewed with patient? No  SHORT TERM GOALS: Target date: 01/09/2023. Target date updated to 05/28/2023 for all unmet goals on 03/05/2023.   Patient will be independent with initial home exercise program for self-management of symptoms. Baseline: Initial HEP to be provided at visit 2 as appropriate (12/26/22); Goal status: MET  LONG TERM GOALS: Target date: 03/20/2023. Target date updated to 08/26/23  for all unmet goals on 06/03/2023. Target date updated to 11/20/2023 for all  unmet goals on 08/28/2023. Target date updated to 02/17/2024  for all unmet goals on 11/25/2023. Target date updated to 04/14/2024 for all unmet goals on 01/22/2024.   Patient will be independent with a long-term home exercise program for self-management of symptoms.  Baseline: Initial HEP to be provided at visit 2 as appropriate (12/26/22); participating as able (01/31/2023); patient currently participating as tolerated (03/05/2023; 04/09/2023; 06/03/2023); participates daily in mat exercises and in weight bearing exercises as tolerated (10/08/2023; 11/25/2023); Tries to do daily but sometimes cannot when his body won't let him (01/22/2024);  Goal status: In-progress  2.  Patient will demonstrate improved FOTO by equal or greater than 10 points by visit #13 to demonstrate improvement in overall condition  and self-reported functional ability.  Baseline: to be tested visit 2 as appropriate (12/26/22); 30 at visit #3 (01/02/2023); 46 at visit #13 (03/05/2023); 45 at visit #29 (06/03/2023); 49 at visit #50 (10/08/2023);  Goal status: MET  3.  Patient will demonstrate the ability to ambulate equal or greater than 600 feet with LRAD during the 6 minute walk to improve his household and community mobility.  Baseline: 34 feet with bari-RW (12/26/22); 200 feet with bari-RW (01/31/2023); 500 feet with BRW (03/05/2023); 400 feet with BRW (04/09/2023); 152 feet with BRW and W/C follow for safety (06/03/2023); 330 feet with BRW (10/08/2023); 245 feet with BRW and SBA for safety (11/25/2023); 343 feet with BRW and SBA for safety (01/22/2024);  Goal status: In progress  4.  Patient will complete 5 Time Sit To Stand Test from 19.5 inch surface or lower in equal or less than 15 seconds with no UE support to demonstrate improved transfer ability for improved household and toileting mobility.  Baseline: 23 seconds with heavy B UE support on RW from 19.5 inch plinth. Pain throbbing down the right LE.  (12/26/22); 19 seconds with heavy B UE support on RW from 19.5 inch plinth (03/05/2023); 20 seconds with heavy B UE support on RRW from 18.5 inch plinth. Painful in back of B calves, thighs, and lower back (04/09/2023); 15 seconds with heavy B UE support on BRW from 18.5 inch plinth. Painful in back of B calves, thighs, and lower back (06/03/2023); 19 seconds with heavy B UE support on BRW from 18.5 inch plinth. Painful in back of B calves, thighs, and lower back (10/08/2023); 18 seconds with heavy B UE support on BRW from 18.5 inch plinth. Painful in back of B calves, thighs, and lower back (11/25/2023); 15seconds with heavy B UE support on BRW from 18.5 inch plinth. Painful in back of B calves, thighs, and lower back (01/22/2024);  Goal status: Progressing   5.  Patient will report worst pain equal or less than 3/10 with functional  activities to improve his ability to complete basic household and community mobility. Baseline: up to 8/10 (12/26/22); reports 3/10 pain (01/31/2023); reports 4/10 pain (02/19/2023); up to 7/10 in the last 2 weeks (03/05/2023): up to  7/10  in the last two weeks (04/09/2023); up to 8/10 in the last 2 weeks (06/03/2023);  up to 9/10 over the last 2 weeks (10/08/2023); up to 7-8/10 in the last 2 weeks (11/25/2023); 7/10 in the last 2 weeks (01/22/2024);  Goal status: Ongoing  6.  Patient will demonstrate improvement in Patient Specific Functional Scale (PSFS) by equal or greater than 3/10 points to reflect clinically significant improvement in patient's most valued functional activities. Baseline: 1.7/10 for Walking Around [address], Enjoying Life, and Driving (97/74/74); 7.2/89 (11/25/2023); 2.7/10 (01/22/24);  Goal status: progressing  PLAN:  PT FREQUENCY: 1-2x/week PT DURATION: 12 weeks PLANNED INTERVENTIONS: Therapeutic exercises, Therapeutic activity, Neuromuscular re-education, Balance training, Gait training, Patient/Family education, Self Care, Joint mobilization, Stair training, Orthotic/Fit training, DME instructions, Aquatic Therapy, Dry Needling, Electrical stimulation, Wheelchair mobility training, Spinal mobilization, Cryotherapy, Moist heat, Manual therapy, and Re-evaluation.  PLAN FOR NEXT SESSION:  update HEP as appropriate, neurodynamics, gait training, LE/core/functional strengthening, stretching, and balance as tolerated, education, manual therapy as needed. Pelvic floor.   Dorina HERO. Fairly IV, PT, DPT Physical Therapist- [place]  [facility] 6:19 PM, 03/02/24

## 2024-03-04 ENCOUNTER — Ambulatory Visit: Admitting: Physical Therapy

## 2024-03-04 ENCOUNTER — Encounter: Payer: Self-pay | Admitting: Physical Therapy

## 2024-03-04 DIAGNOSIS — M6281 Muscle weakness (generalized): Secondary | ICD-10-CM

## 2024-03-04 DIAGNOSIS — M5459 Other low back pain: Secondary | ICD-10-CM

## 2024-03-04 DIAGNOSIS — R262 Difficulty in walking, not elsewhere classified: Secondary | ICD-10-CM

## 2024-03-04 DIAGNOSIS — M5416 Radiculopathy, lumbar region: Secondary | ICD-10-CM

## 2024-03-04 DIAGNOSIS — R29818 Other symptoms and signs involving the nervous system: Secondary | ICD-10-CM

## 2024-03-04 DIAGNOSIS — R296 Repeated falls: Secondary | ICD-10-CM

## 2024-03-04 NOTE — Therapy (Signed)
 OUTPATIENT PHYSICAL THERAPY TREATMENT   Patient Name: Travis Fitzpatrick MRN: [id number] DOB:[birth year], 43 y.o., male Today's Date: 03/04/24   END OF SESSION:  PT End of Session - 03/04/24 2323     Visit Number 78    Number of Visits 93    Date for PT Re-Evaluation 04/15/24    Authorization Type MEDICARE PART B reporting period from 01/22/2024    Progress Note Due on Visit 80    PT Start Time 1605    PT Stop Time 1645    PT Time Calculation (min) 40 min    Activity Tolerance Patient limited by pain;Patient tolerated treatment well    Behavior During Therapy [facility] for tasks assessed/performed           Past Medical History:  Diagnosis Date   Bowel trouble    urgency   Medical history non-contributory    Urinary urgency    Past Surgical History:  Procedure Laterality Date   BACK SURGERY  2010   CIRCUMCISION     LUMBAR LAMINECTOMY/DECOMPRESSION MICRODISCECTOMY  07/20/2011   Procedure: LUMBAR LAMINECTOMY/DECOMPRESSION MICRODISCECTOMY;  Surgeon: Rockey LITTIE Peru;  Location: [facility];  Service: Neurosurgery;  Laterality: N/A;  right thoracotomy with thoracic eight-nine discectomy and fusion   LUMBAR LAMINECTOMY/DECOMPRESSION MICRODISCECTOMY Right 10/06/2021   Procedure: Right Lumbar Four-Five Microdiscectomy, Right Lumbar Five-Sacal One Foraminotomy;  Surgeon: Peru Rockey, MD;  Location: [facility];  Service: Neurosurgery;  Laterality: Right;  3C/RM 21   THORACIC DISCECTOMY  07/16/2012   Procedure: THORACIC DISCECTOMY;  Surgeon: Rockey LITTIE Peru, MD;  Location: [facility];  Service: Neurosurgery;  Laterality: Right;  RIGHT Thoracic seven-eight  thoracic diskectomy via thoracotomy by dr lucas   THORACIC DISCECTOMY N/A 12/15/2014   Procedure: THORACIC SEVEN TO THORACIC NINE Laminectomy ;  Surgeon: Rockey Peru, MD;  Location: [facility];  Service: Neurosurgery;  Laterality: N/A;   THORACIC DISCECTOMY N/A 06/06/2017   Procedure: LAMINECTOMY THORACIC NINE-TEN;  Surgeon: [place] Rockey,  MD;  Location: [facility];  Service: Neurosurgery;  Laterality: N/A;  LAMINECTOMY THORACIC NINE-TEN   THORACOTOMY  07/20/2011   Procedure: THORACOTOMY OPEN FOR SPINE SURGERY;  Surgeon: JONETTA Belvie Nam, MD;  Location: [facility];  Service: Vascular;  Laterality: N/A;   THORACOTOMY  07/16/2012   Procedure: THORACOTOMY OPEN FOR SPINE SURGERY;  Surgeon: Dorise MARLA lucas, MD;  Location: [facility];  Service: Thoracic;  Laterality: N/A;   Patient Active Problem List   Diagnosis Date Noted   Carpal tunnel syndrome on right 02/19/2024   Spasticity 09/25/2023   Nerve pain 12/05/2022   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   S/P lumbar spinal fusion 05/25/2022   Synovial cyst of lumbar facet joint 05/25/2022   HNP (herniated nucleus pulposus), lumbar 10/06/2021   Hand pain 06/28/2021   Lumbar facet arthropathy 01/11/2021   Thoracic spondylosis with myelopathy 01/11/2021   Thoracic spinal stenosis 06/06/2017   Stenosis, spinal, thoracic 12/15/2014   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 09/22/2014   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/2011    PCP: Maryl Sayres  REFERRING PROVIDER: Babs Arthea DASEN, MD  REFERRING DIAG: thoracic disc disease with myelopathy, nerve pain, adhesive arachnoiditis  Rationale for Evaluation and Treatment: Rehabilitation  THERAPY DIAG:  Other low back pain  Other symptoms and signs involving the nervous system  Radiculopathy, lumbar region  Muscle weakness (generalized)  Repeated falls  Difficulty in walking, not elsewhere classified  ONSET DATE:  Suddenly started having weakness 11 years ago, most  recent episode of worsening October 2022, s/p right L4-5 laminotomy, microdiscectomy on 10/06/2021, now s/p L4-5 PLIF by Dr. Gillie on 05/25/2022.  PERTINENT HISTORY:  Patient is a 43 y.o. male who presents to outpatient physical therapy with a referral for medical diagnosis thoracic disc disease with myelopathy, nerve pain,  adhesive arachnoiditis. This patient's chief complaints consist of disabling low back and R >L leg pain and weakness with activity, bowel and bladder urgency, incontinence, and retention, leading to the following functional deficits: difficulty with or unable to complete any activity that requires weight bearing, use of B LE, and/or balance including working, household and community mobility, walking, driving, going to family gatherings, avoiding incontinent episodes, playing with daughter, helping around the house, bed mobility, transfers. 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 Gillie on 05/25/2022, adhesive arachnoiditis, nerve pain.  Patient denies hx of cancer, stroke, seizures, lung problems, heart problems, diabetes, unexplained weight loss, and osteoporosis.  SUBJECTIVE:                                                                                                                                                                                           SUBJECTIVE STATEMENT:     Patient states he is doing okay. He state she has pain and tightness in the low back and down the back of both legs. Dr. Babs wanted him to get an EMG/NCV for his hand/neck. He also advised patient to practice driving in parking lots and not worry about hand controls if that doesn't go well.   PAIN:  NPRS:  2/10 mostly in low back, right groin/hip, bilateral posterior thighs, right calf.   PRECAUTIONS: Fall  PATIENT GOALS: To get better, walk, drive again, climb stairs, play with my daughter, help  around the house again  OBJECTIVE  TREATMENT Manual therapy: to reduce pain and tissue tension, improve range of motion, neuromodulation, in order to promote improved ability to complete functional activities. Hooklying R hip caudal/posterior glide with belt, 3-4x30-45 seconds, grade IV   Neuromuscular Re-education: a technique or exercise performed with the goal of improving the level of communication between the body and the brain, such as for balance, motor control, muscle activation patterns, coordination, desensitization, quality of muscle contraction, proprioception, and/or kinesthetic sense needed for successful and safe  completion of functional activities.  Hooklying pelvic tilt AROM attempting to find pain free range.  Unable to perform PPT without pain.   Supine with B LE elevated on traction stool, pelvic tilt AROM attempting to find pain free range. Able to perform full range now without pain.   Supine with B LE elevated on traction stool, PPT with lat pull over while maintaining pelvic position.  A few reps to learn then  1x15 with 20#KB 2x15 with 30#KB  No pain once low back controlled with abdominals Harder than seated pull down  Supine with B LE elevated on traction stool, PPT hold with braced spine to bridge with feet elevated on traction stool Instant pain in back with traction stool at knees and at ankles Discontinued.   Supine with B LE elevated on traction stool, PPT hold with braced spine.  Alternating single arm chest fly with DB while maintaining core stability 3x10 each side A little hard on arms No pain in back Feels core challenged differently  Discussed trying to get into quadruped, but did not attempt it due to short amount of time left in visit and poor tolerance when last attempted several months ago.   Therapeutic activities: dynamic therapeutic activities incorporating MULTIPLE parameters or areas of the body designed to achieve improved functional  performance. Sled push (75# sled only) 2x30 feet  Seated rest between pushes  Pt required multimodal cuing for proper technique and to facilitate improved neuromuscular control, strength, range of motion, and functional ability resulting in improved performance and form.  PATIENT EDUCATION:  Education details: Exercise purpose/form. Self management techniques. POC.  Person educated: patient Education method: Explanation Education comprehension: verbalized understanding and needs further education  HOME EXERCISE PROGRAM: Verbally:  - seated lumbar flexion stretch with head down and one LE extended using rollator, 1x10 each side.  - curl up with legs elevated, 5x15 seconds - sidelying open book 1x10 each side  Access Code: [email or web address] URL: [email or web address] Date: 12/18/2023 Prepared by: Camie Cleverly  Exercises - Seated Diaphragmatic Breathing  - 3 reps - 1 minute practice time - Seated Pelvic Floor Elevators  - 3 x daily - 2 sets - 10 reps - Seated Ankle Plantar Flexion with Resistance Loop  - 1 x daily - 3 sets - 20 reps - Seated Ankle Dorsiflexion AROM  - 1 x daily - 3 sets - 10-30 reps - Seated Ankle Inversion AROM  - 1 x daily - 3 sets - 10-30 reps - Seated Ankle Eversion AROM  - 1 x daily - 3 sets - 10-30 reps - Sidelying Ankle Eversion Strengthening with Ankle Weight  - 1 x daily - 3 sets - 20 reps - Sidelying Ankle Inversion with Ankle Weight  - 1 x daily - 3 sets - 20 reps - Seated Ankle Dorsiflexion with Ankle Weight  - 1 x daily - 3 sets - 20 reps  ASSESSMENT:  CLINICAL IMPRESSION:     Continuing working on progressive LE and back/core strengthening.motor control. Patient able to get a stronger challenge to the abdominals with no low back pain when using pelvic tilt hold with legs elevated. Unable to tolerate bridge using the same technique. Wanted to attempt quadruped or bent over hip extension with focus on lumbopelvic control, but ran out of time to try  something that has caused significant flair ups in the past. Consider this for next session. Continued with sled push with remaining time to continue strengthening in more functional position. It increased his pain  as usual, but not past the point he felt comfortable with. May benefit from dropping hip manual therapy as he did well without it for the last two weeks.  Patient would benefit from continued management of limiting condition by skilled physical therapist to address remaining impairments and functional limitations to work towards stated goals and return to PLOF or maximal functional independence.    OBJECTIVE IMPAIRMENTS: Abnormal gait, decreased activity tolerance, decreased balance, decreased coordination, decreased endurance, decreased knowledge of condition, decreased knowledge of use of DME, decreased mobility, difficulty walking, decreased ROM, decreased strength, increased edema, impaired perceived functional ability, increased muscle spasms, impaired flexibility, impaired sensation, impaired tone, improper body mechanics, postural dysfunction, obesity, and pain.   ACTIVITY LIMITATIONS: carrying, lifting, bending, standing, squatting, stairs, transfers, bed mobility, continence, bathing, toileting, dressing, hygiene/grooming, locomotion level, and caring for others  PARTICIPATION LIMITATIONS: meal prep, cleaning, laundry, interpersonal relationship, driving, shopping, community activity, occupation, yard work, and  difficulty with or unable to complete any activity that requires weight bearing, use of B LE, and/or balance including working, household and community mobility, walking, driving, going to family gatherings, avoiding incontinent episodes, playing with daughter, helping around the house, bed mobility, transfers  PERSONAL FACTORS: Past/current experiences, Time since onset of injury/illness/exacerbation, and 3+ comorbidities:  3 thoracic spine surgeries, thoracic MRI notes  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 Gillie on 05/25/2022, adhesive arachnoiditis, nerve pain are also affecting patient's functional outcome.   REHAB POTENTIAL: Fair due to severity and nature of condition.   CLINICAL DECISION MAKING: Evolving/moderate complexity  EVALUATION COMPLEXITY: Moderate   GOALS: Goals reviewed with patient? No  SHORT TERM GOALS: Target date: 01/09/2023. Target date updated to 05/28/2023 for all unmet goals on 03/05/2023.   Patient will be independent with initial home exercise program for self-management of symptoms. Baseline: Initial HEP to be provided at visit 2 as appropriate (12/26/22); Goal status: MET  LONG TERM GOALS: Target date: 03/20/2023. Target date updated to 08/26/23  for all unmet goals on 06/03/2023. Target date updated to 11/20/2023 for all  unmet goals on 08/28/2023. Target date updated to 02/17/2024  for all unmet goals on 11/25/2023. Target date updated to 04/14/2024 for all unmet goals on 01/22/2024.   Patient will be independent with a long-term home exercise program for self-management of symptoms.  Baseline: Initial HEP to be provided at visit 2 as appropriate (12/26/22); participating as able (01/31/2023); patient currently participating as tolerated (03/05/2023; 04/09/2023; 06/03/2023); participates daily in mat exercises and in weight bearing exercises as tolerated (10/08/2023; 11/25/2023); Tries to do daily but sometimes cannot when his body won't let him (01/22/2024);  Goal status: In-progress  2.  Patient will demonstrate improved FOTO by equal or greater than 10 points by visit #13 to demonstrate improvement in overall condition  and self-reported functional ability.  Baseline: to be tested visit 2 as appropriate (12/26/22); 30 at visit #3 (01/02/2023); 46 at visit #13 (03/05/2023); 45 at visit #29 (06/03/2023); 49 at visit #50 (10/08/2023);  Goal status: MET  3.  Patient will demonstrate the ability to ambulate equal or greater than 600 feet with LRAD during the 6 minute walk to improve his household and community mobility.  Baseline: 34 feet with bari-RW (12/26/22);  200 feet with bari-RW (01/31/2023); 500 feet with BRW (03/05/2023); 400 feet with BRW (04/09/2023); 152 feet with BRW and W/C follow for safety (06/03/2023); 330 feet with BRW (10/08/2023); 245 feet with BRW and SBA for safety (11/25/2023); 343 feet with BRW and SBA for safety (01/22/2024);  Goal status: In progress  4.  Patient will complete 5 Time Sit To Stand Test from 19.5 inch surface or lower in equal or less than 15 seconds with no UE support to demonstrate improved transfer ability for improved household and toileting mobility.  Baseline: 23 seconds with heavy B UE support on RW from 19.5 inch plinth. Pain throbbing down the right LE.  (12/26/22); 19 seconds with heavy B UE support on RW from 19.5 inch plinth (03/05/2023); 20 seconds with heavy B UE support on RRW from 18.5 inch plinth. Painful in back of B calves, thighs, and lower back (04/09/2023); 15 seconds with heavy B UE support on BRW from 18.5 inch plinth. Painful in back of B calves, thighs, and lower back (06/03/2023); 19 seconds with heavy B UE support on BRW from 18.5 inch plinth. Painful in back of B calves, thighs, and lower back (10/08/2023); 18 seconds with heavy B UE support on BRW from 18.5 inch plinth. Painful in back of B calves, thighs, and lower back (11/25/2023); 15seconds with heavy B UE support on BRW from 18.5 inch plinth. Painful in back of B calves, thighs, and lower back (01/22/2024);  Goal status: Progressing   5.  Patient will report worst pain equal or less than 3/10 with functional  activities to improve his ability to complete basic household and community mobility. Baseline: up to 8/10 (12/26/22); reports 3/10 pain (01/31/2023); reports 4/10 pain (02/19/2023); up to 7/10 in the last 2 weeks (03/05/2023): up to  7/10 in the last two weeks (04/09/2023); up to 8/10 in the last 2 weeks (06/03/2023);  up to 9/10 over the last 2 weeks (10/08/2023); up to 7-8/10 in the last 2 weeks (11/25/2023); 7/10 in the last 2 weeks (01/22/2024);  Goal status: Ongoing  6.  Patient will demonstrate improvement in Patient Specific Functional Scale (PSFS) by equal or greater than 3/10 points to reflect clinically significant improvement in patient's most valued functional activities. Baseline: 1.7/10 for Walking Around [address], Enjoying Life, and Driving (97/74/74); 7.2/89 (11/25/2023); 2.7/10 (01/22/24);  Goal status: progressing  PLAN:  PT FREQUENCY: 1-2x/week PT DURATION: 12 weeks PLANNED INTERVENTIONS: Therapeutic exercises, Therapeutic activity, Neuromuscular re-education, Balance training, Gait training, Patient/Family education, Self Care, Joint mobilization, Stair training, Orthotic/Fit training, DME instructions, Aquatic Therapy, Dry Needling, Electrical stimulation, Wheelchair mobility training, Spinal mobilization, Cryotherapy, Moist heat, Manual therapy, and Re-evaluation.  PLAN FOR NEXT SESSION:  update HEP as appropriate, neurodynamics, gait training, LE/core/functional strengthening, stretching, and balance as tolerated, education, manual therapy as needed. Pelvic floor.   Camie SAUNDERS. Juli, PT, DPT, Cert. MDT 03/04/24, 11:32 PM  [facility] [address] [place], [place] [address] P: [phone] I F: [phone]

## 2024-03-10 ENCOUNTER — Other Ambulatory Visit: Payer: Self-pay | Admitting: Physical Medicine & Rehabilitation

## 2024-03-10 DIAGNOSIS — M792 Neuralgia and neuritis, unspecified: Secondary | ICD-10-CM

## 2024-03-10 DIAGNOSIS — G039 Meningitis, unspecified: Secondary | ICD-10-CM

## 2024-03-10 DIAGNOSIS — M5104 Intervertebral disc disorders with myelopathy, thoracic region: Secondary | ICD-10-CM

## 2024-03-11 ENCOUNTER — Encounter: Payer: Self-pay | Admitting: Physical Therapy

## 2024-03-11 ENCOUNTER — Ambulatory Visit: Admitting: Physical Therapy

## 2024-03-11 DIAGNOSIS — M5459 Other low back pain: Secondary | ICD-10-CM | POA: Diagnosis not present

## 2024-03-11 DIAGNOSIS — R296 Repeated falls: Secondary | ICD-10-CM

## 2024-03-11 DIAGNOSIS — M6281 Muscle weakness (generalized): Secondary | ICD-10-CM

## 2024-03-11 DIAGNOSIS — M5416 Radiculopathy, lumbar region: Secondary | ICD-10-CM

## 2024-03-11 DIAGNOSIS — R262 Difficulty in walking, not elsewhere classified: Secondary | ICD-10-CM

## 2024-03-11 DIAGNOSIS — R29818 Other symptoms and signs involving the nervous system: Secondary | ICD-10-CM

## 2024-03-11 NOTE — Therapy (Signed)
 OUTPATIENT PHYSICAL THERAPY TREATMENT   Patient Name: Travis Fitzpatrick MRN: [id number] DOB:07-08-1981, 43 y.o., male Today's Date: 03/11/24   END OF SESSION:  PT End of Session - 03/11/24 2048     Visit Number 79    Number of Visits 93    Date for PT Re-Evaluation 04/15/24    Authorization Type MEDICARE PART B reporting period from 01/22/2024    Progress Note Due on Visit 80    PT Start Time 1650    PT Stop Time 1730    PT Time Calculation (min) 40 min    Activity Tolerance Patient limited by pain;Patient tolerated treatment well    Behavior During Therapy [facility] for tasks assessed/performed            Past Medical History:  Diagnosis Date   Bowel trouble    urgency   Medical history non-contributory    Urinary urgency    Past Surgical History:  Procedure Laterality Date   BACK SURGERY  2010   CIRCUMCISION     LUMBAR LAMINECTOMY/DECOMPRESSION MICRODISCECTOMY  07/20/2011   Procedure: LUMBAR LAMINECTOMY/DECOMPRESSION MICRODISCECTOMY;  Surgeon: Rockey LITTIE Peru;  Location: [facility];  Service: Neurosurgery;  Laterality: N/A;  right thoracotomy with thoracic eight-nine discectomy and fusion   LUMBAR LAMINECTOMY/DECOMPRESSION MICRODISCECTOMY Right 10/06/2021   Procedure: Right Lumbar Four-Five Microdiscectomy, Right Lumbar Five-Sacal One Foraminotomy;  Surgeon: Peru Rockey, MD;  Location: [facility];  Service: Neurosurgery;  Laterality: Right;  3C/RM 21   THORACIC DISCECTOMY  07/16/2012   Procedure: THORACIC DISCECTOMY;  Surgeon: Rockey LITTIE Peru, MD;  Location: [facility];  Service: Neurosurgery;  Laterality: Right;  RIGHT Thoracic seven-eight  thoracic diskectomy via thoracotomy by dr lucas   THORACIC DISCECTOMY N/A 12/15/2014   Procedure: THORACIC SEVEN TO THORACIC NINE Laminectomy ;  Surgeon: Rockey Peru, MD;  Location: [facility];  Service: Neurosurgery;  Laterality: N/A;   THORACIC DISCECTOMY N/A 06/06/2017   Procedure: LAMINECTOMY THORACIC NINE-TEN;  Surgeon: [place] Rockey,  MD;  Location: [facility];  Service: Neurosurgery;  Laterality: N/A;  LAMINECTOMY THORACIC NINE-TEN   THORACOTOMY  07/20/2011   Procedure: THORACOTOMY OPEN FOR SPINE SURGERY;  Surgeon: JONETTA Belvie Nam, MD;  Location: [facility];  Service: Vascular;  Laterality: N/A;   THORACOTOMY  07/16/2012   Procedure: THORACOTOMY OPEN FOR SPINE SURGERY;  Surgeon: Dorise MARLA lucas, MD;  Location: [facility];  Service: Thoracic;  Laterality: N/A;   Patient Active Problem List   Diagnosis Date Noted   Carpal tunnel syndrome on right 02/19/2024   Spasticity 09/25/2023   Nerve pain 12/05/2022   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   S/P lumbar spinal fusion 05/25/2022   Synovial cyst of lumbar facet joint 05/25/2022   HNP (herniated nucleus pulposus), lumbar 10/06/2021   Hand pain 06/28/2021   Lumbar facet arthropathy 01/11/2021   Thoracic spondylosis with myelopathy 01/11/2021   Thoracic spinal stenosis 06/06/2017   Stenosis, spinal, thoracic 12/15/2014   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 09/22/2014   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/2011    PCP: Maryl Sayres  REFERRING PROVIDER: Babs Arthea DASEN, MD  REFERRING DIAG: thoracic disc disease with myelopathy, nerve pain, adhesive arachnoiditis  Rationale for Evaluation and Treatment: Rehabilitation  THERAPY DIAG:  Other low back pain  Other symptoms and signs involving the nervous system  Radiculopathy, lumbar region  Muscle weakness (generalized)  Repeated falls  Difficulty in walking, not elsewhere classified  ONSET DATE:  Suddenly started having weakness 11 years ago,  most recent episode of worsening October 2022, s/p right L4-5 laminotomy, microdiscectomy on 10/06/2021, now s/p L4-5 PLIF by Dr. Gillie on 05/25/2022.  PERTINENT HISTORY:  Patient is a 43 y.o. male who presents to outpatient physical therapy with a referral for medical diagnosis thoracic disc disease with myelopathy, nerve pain,  adhesive arachnoiditis. This patient's chief complaints consist of disabling low back and R >L leg pain and weakness with activity, bowel and bladder urgency, incontinence, and retention, leading to the following functional deficits: difficulty with or unable to complete any activity that requires weight bearing, use of B LE, and/or balance including working, household and community mobility, walking, driving, going to family gatherings, avoiding incontinent episodes, playing with daughter, helping around the house, bed mobility, transfers. 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 Gillie on 05/25/2022, adhesive arachnoiditis, nerve pain.  Patient denies hx of cancer, stroke, seizures, lung problems, heart problems, diabetes, unexplained weight loss, and osteoporosis.  SUBJECTIVE:                                                                                                                                                                                           SUBJECTIVE STATEMENT:     Patient states he is doing okay. He felt okay after last PT session. He state he fell and during the night afterward he wet himself without knowing it. This was Friday. He did not get sensation of urgency that night but since then he has been able to feel the urge. He did not hurt himself otherwise in the fall. He was transferring in his bathroom when it happened.   PAIN:  NPRS:  2/10 mostly in low back, right groin/hip, bilateral posterior thighs, right calf.   PRECAUTIONS: Fall  PATIENT GOALS: To  get better, walk, drive again, climb stairs, play with my daughter, help around the house again  OBJECTIVE  TREATMENT  Neuromuscular Re-education: a technique or exercise performed with the goal of improving the level of communication between the body and the brain, such as for balance, motor control, muscle activation patterns, coordination, desensitization, quality of muscle contraction, proprioception, and/or kinesthetic sense needed for successful and safe completion of functional activities.   Supine with B LE elevated on traction stool, PPT with lat pull over while maintaining  pelvic position.  3x10 with 30#KB  No pain since low back controlled with abdominals  Attempts in several positions to perform prone-quadruped hip extension with PPT.  Modified quadruped with bolster under abdomen: bolster too soft to support hips when lifting one leg  Prone with firm circular bolster under hips: too painful with not enough flexion in this position.   Quadruped: not strong enough in hips and abdominals to support position when moving a leg.   Standing laying trunk on plinth with arms holding on and legs supporting some from floor, hip crease at edge of plinth.  Slight PPT hold with hip extension:  2x5-6 each side Unable to do alternating  Therapeutic exercise: therapeutic exercises that incorporate ONE parameter at one or more areas of the body to centralize symptoms, develop strength and endurance, range of motion, and flexibility required for successful completion of functional activities.  Seated trunk rotation against GTB looped behind ipsilateral shoulder and held by PT 2x10 each way Painful but not intolerable  Pt required multimodal cuing for proper technique and to facilitate improved neuromuscular control, strength, range of motion, and functional ability resulting in improved performance and form.  PATIENT EDUCATION:  Education details: Exercise purpose/form. Self management  techniques. POC.  Person educated: patient Education method: Explanation Education comprehension: verbalized understanding and needs further education  HOME EXERCISE PROGRAM: Verbally:  - seated lumbar flexion stretch with head down and one LE extended using rollator, 1x10 each side.  - curl up with legs elevated, 5x15 seconds - sidelying open book 1x10 each side  Access Code: [email or web address] URL: [email or web address] Date: 12/18/2023 Prepared by: Camie Cleverly  Exercises - Seated Diaphragmatic Breathing  - 3 reps - 1 minute practice time - Seated Pelvic Floor Elevators  - 3 x daily - 2 sets - 10 reps - Seated Ankle Plantar Flexion with Resistance Loop  - 1 x daily - 3 sets - 20 reps - Seated Ankle Dorsiflexion AROM  - 1 x daily - 3 sets - 10-30 reps - Seated Ankle Inversion AROM  - 1 x daily - 3 sets - 10-30 reps - Seated Ankle Eversion AROM  - 1 x daily - 3 sets - 10-30 reps - Sidelying Ankle Eversion Strengthening with Ankle Weight  - 1 x daily - 3 sets - 20 reps - Sidelying Ankle Inversion with Ankle Weight  - 1 x daily - 3 sets - 20 reps - Seated Ankle Dorsiflexion with Ankle Weight  - 1 x daily - 3 sets - 20 reps  ASSESSMENT:  CLINICAL IMPRESSION:     Patient ambulating with BRW today. Continued with plan to attempt prone/quadruped motor control exercises to improve lumbopelvic control with hip movement. Patient found all attempts painful but not intolerable. He was too weak in his hips and core to perform in quadruped or various attempts at alternate positions until he performed hip extension while leaning trunk over table with UE support. He was still limited by decreased LE strength but was able to perform up to 6 reps at a time with tolerable discomfort. He tolerated resisted trunk rotations with theraband and continued with supine legs elevated core exercises. Patient would benefit from continued management of limiting condition by skilled physical therapist to address  remaining impairments and functional limitations to work towards stated goals and return to PLOF or maximal functional independence.     OBJECTIVE IMPAIRMENTS: Abnormal gait, decreased activity tolerance, decreased balance, decreased coordination, decreased endurance, decreased knowledge of condition, decreased knowledge of use of  DME, decreased mobility, difficulty walking, decreased ROM, decreased strength, increased edema, impaired perceived functional ability, increased muscle spasms, impaired flexibility, impaired sensation, impaired tone, improper body mechanics, postural dysfunction, obesity, and pain.   ACTIVITY LIMITATIONS: carrying, lifting, bending, standing, squatting, stairs, transfers, bed mobility, continence, bathing, toileting, dressing, hygiene/grooming, locomotion level, and caring for others  PARTICIPATION LIMITATIONS: meal prep, cleaning, laundry, interpersonal relationship, driving, shopping, community activity, occupation, yard work, and  difficulty with or unable to complete any activity that requires weight bearing, use of B LE, and/or balance including working, household and community mobility, walking, driving, going to family gatherings, avoiding incontinent episodes, playing with daughter, helping around the house, bed mobility, transfers  PERSONAL FACTORS: Past/current experiences, Time since onset of injury/illness/exacerbation, and 3+ comorbidities:  3 thoracic spine surgeries, thoracic MRI notes 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 Gillie on 05/25/2022, adhesive arachnoiditis, nerve pain are also affecting patient's functional outcome.   REHAB  POTENTIAL: Fair due to severity and nature of condition.   CLINICAL DECISION MAKING: Evolving/moderate complexity  EVALUATION COMPLEXITY: Moderate   GOALS: Goals reviewed with patient? No  SHORT TERM GOALS: Target date: 01/09/2023. Target date updated to 05/28/2023 for all unmet goals on 03/05/2023.   Patient will be independent with initial home exercise program for self-management of symptoms. Baseline: Initial HEP to be provided at visit 2 as appropriate (12/26/22); Goal status: MET  LONG TERM GOALS: Target date: 03/20/2023. Target date updated to 08/26/23  for all unmet goals on 06/03/2023. Target date updated to 11/20/2023 for all  unmet goals on 08/28/2023. Target date updated to 02/17/2024  for all unmet goals on 11/25/2023. Target date updated to 04/14/2024 for all unmet goals on 01/22/2024.   Patient will be independent with a long-term home exercise program for self-management of symptoms.  Baseline: Initial HEP to be provided at visit 2 as appropriate (12/26/22); participating as able (01/31/2023); patient currently participating as tolerated (03/05/2023; 04/09/2023; 06/03/2023); participates daily in mat exercises and in weight bearing exercises as tolerated (10/08/2023; 11/25/2023); Tries to do daily but sometimes cannot when his body won't let him (01/22/2024);  Goal status: In-progress  2.  Patient will demonstrate improved FOTO by equal or greater than 10 points by visit #13 to demonstrate improvement in overall condition and self-reported functional ability.  Baseline: to be tested visit 2 as appropriate (12/26/22); 30 at visit #3 (01/02/2023); 46 at visit #13 (03/05/2023); 45 at visit #29 (06/03/2023); 49 at visit #50 (10/08/2023);  Goal status: MET  3.  Patient will demonstrate the ability to ambulate equal or greater than 600 feet with LRAD during the 6 minute walk to improve his household and community mobility.  Baseline: 34 feet with bari-RW (12/26/22); 200 feet with bari-RW (01/31/2023);  500 feet with BRW (03/05/2023); 400 feet with BRW (04/09/2023); 152 feet with BRW and W/C follow for safety (06/03/2023); 330 feet with BRW (10/08/2023); 245 feet with BRW and SBA for safety (11/25/2023); 343 feet with BRW and SBA for safety (01/22/2024);  Goal status: In progress  4.  Patient will complete 5 Time Sit To Stand Test from 19.5 inch surface or lower in equal or less than 15 seconds with no UE support to demonstrate improved transfer ability for  improved household and toileting mobility.  Baseline: 23 seconds with heavy B UE support on RW from 19.5 inch plinth. Pain throbbing down the right LE.  (12/26/22); 19 seconds with heavy B UE support on RW from 19.5 inch plinth (03/05/2023); 20 seconds with heavy B UE support on RRW from 18.5 inch plinth. Painful in back of B calves, thighs, and lower back (04/09/2023); 15 seconds with heavy B UE support on BRW from 18.5 inch plinth. Painful in back of B calves, thighs, and lower back (06/03/2023); 19 seconds with heavy B UE support on BRW from 18.5 inch plinth. Painful in back of B calves, thighs, and lower back (10/08/2023); 18 seconds with heavy B UE support on BRW from 18.5 inch plinth. Painful in back of B calves, thighs, and lower back (11/25/2023); 15seconds with heavy B UE support on BRW from 18.5 inch plinth. Painful in back of B calves, thighs, and lower back (01/22/2024);  Goal status: Progressing   5.  Patient will report worst pain equal or less than 3/10 with functional activities to improve his ability to complete basic household and community mobility. Baseline: up to 8/10 (12/26/22); reports 3/10 pain (01/31/2023); reports 4/10 pain (02/19/2023); up to 7/10 in the last 2 weeks (03/05/2023): up to  7/10 in the last two weeks (04/09/2023); up to 8/10 in the last 2 weeks (06/03/2023);  up to 9/10 over the last 2 weeks (10/08/2023); up to 7-8/10 in the last 2 weeks (11/25/2023); 7/10 in the last 2 weeks (01/22/2024);  Goal status: Ongoing  6.  Patient will  demonstrate improvement in Patient Specific Functional Scale (PSFS) by equal or greater than 3/10 points to reflect clinically significant improvement in patient's most valued functional activities. Baseline: 1.7/10 for Walking Around [address], Enjoying Life, and Driving (97/74/74); 7.2/89 (11/25/2023); 2.7/10 (01/22/24);  Goal status: progressing  PLAN:  PT FREQUENCY: 1-2x/week PT DURATION: 12 weeks PLANNED INTERVENTIONS: Therapeutic exercises, Therapeutic activity, Neuromuscular re-education, Balance training, Gait training, Patient/Family education, Self Care, Joint mobilization, Stair training, Orthotic/Fit training, DME instructions, Aquatic Therapy, Dry Needling, Electrical stimulation, Wheelchair mobility training, Spinal mobilization, Cryotherapy, Moist heat, Manual therapy, and Re-evaluation.  PLAN FOR NEXT SESSION:  update HEP as appropriate, neurodynamics, gait training, LE/core/functional strengthening, stretching, and balance as tolerated, education, manual therapy as needed. Pelvic floor.   Camie SAUNDERS. Juli, PT, DPT, Cert. MDT 03/11/24, 9:00 PM  [facility] [address] [place], [place] [address] P: [phone] I F: [phone]

## 2024-03-13 ENCOUNTER — Other Ambulatory Visit: Payer: Self-pay | Admitting: Physical Medicine & Rehabilitation

## 2024-03-13 DIAGNOSIS — R252 Cramp and spasm: Secondary | ICD-10-CM

## 2024-03-17 ENCOUNTER — Ambulatory Visit: Attending: Physical Medicine & Rehabilitation | Admitting: Physical Therapy

## 2024-03-17 DIAGNOSIS — R262 Difficulty in walking, not elsewhere classified: Secondary | ICD-10-CM | POA: Insufficient documentation

## 2024-03-17 DIAGNOSIS — R296 Repeated falls: Secondary | ICD-10-CM | POA: Insufficient documentation

## 2024-03-17 DIAGNOSIS — R29818 Other symptoms and signs involving the nervous system: Secondary | ICD-10-CM | POA: Diagnosis present

## 2024-03-17 DIAGNOSIS — M5416 Radiculopathy, lumbar region: Secondary | ICD-10-CM | POA: Insufficient documentation

## 2024-03-17 DIAGNOSIS — M6281 Muscle weakness (generalized): Secondary | ICD-10-CM | POA: Insufficient documentation

## 2024-03-17 DIAGNOSIS — M5459 Other low back pain: Secondary | ICD-10-CM | POA: Insufficient documentation

## 2024-03-17 NOTE — Therapy (Unsigned)
 OUTPATIENT PHYSICAL THERAPY TREATMENT   Patient Name: Travis Fitzpatrick MRN: [id number] DOB:1981-04-21, 43 y.o., male Today's Date: 03/17/24   END OF SESSION:      Past Medical History:  Diagnosis Date   Bowel trouble    urgency   Medical history non-contributory    Urinary urgency    Past Surgical History:  Procedure Laterality Date   BACK SURGERY  2010   CIRCUMCISION     LUMBAR LAMINECTOMY/DECOMPRESSION MICRODISCECTOMY  07/20/2011   Procedure: LUMBAR LAMINECTOMY/DECOMPRESSION MICRODISCECTOMY;  Surgeon: Rockey LITTIE Peru;  Location: [facility];  Service: Neurosurgery;  Laterality: N/A;  right thoracotomy with thoracic eight-nine discectomy and fusion   LUMBAR LAMINECTOMY/DECOMPRESSION MICRODISCECTOMY Right 10/06/2021   Procedure: Right Lumbar Four-Five Microdiscectomy, Right Lumbar Five-Sacal One Foraminotomy;  Surgeon: Peru Rockey, MD;  Location: [facility];  Service: Neurosurgery;  Laterality: Right;  3C/RM 21   THORACIC DISCECTOMY  07/16/2012   Procedure: THORACIC DISCECTOMY;  Surgeon: Rockey LITTIE Peru, MD;  Location: [facility];  Service: Neurosurgery;  Laterality: Right;  RIGHT Thoracic seven-eight  thoracic diskectomy via thoracotomy by dr lucas   THORACIC DISCECTOMY N/A 12/15/2014   Procedure: THORACIC SEVEN TO THORACIC NINE Laminectomy ;  Surgeon: Rockey Peru, MD;  Location: [facility];  Service: Neurosurgery;  Laterality: N/A;   THORACIC DISCECTOMY N/A 06/06/2017   Procedure: LAMINECTOMY THORACIC NINE-TEN;  Surgeon: Peru Rockey, MD;  Location: [facility];  Service: Neurosurgery;  Laterality: N/A;  LAMINECTOMY THORACIC NINE-TEN   THORACOTOMY  07/20/2011   Procedure: THORACOTOMY OPEN FOR SPINE SURGERY;  Surgeon: JONETTA Belvie Nam, MD;  Location: [facility];  Service: Vascular;  Laterality: N/A;   THORACOTOMY  07/16/2012   Procedure: THORACOTOMY OPEN FOR SPINE SURGERY;  Surgeon: Dorise MARLA lucas, MD;  Location: [facility];  Service: Thoracic;  Laterality: N/A;   Patient Active  Problem List   Diagnosis Date Noted   Carpal tunnel syndrome on right 02/19/2024   Spasticity 09/25/2023   Nerve pain 12/05/2022   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   S/P lumbar spinal fusion 05/25/2022   Synovial cyst of lumbar facet joint 05/25/2022   HNP (herniated nucleus pulposus), lumbar 10/06/2021   Hand pain 06/28/2021   Lumbar facet arthropathy 01/11/2021   Thoracic spondylosis with myelopathy 01/11/2021   Thoracic spinal stenosis 06/06/2017   Stenosis, spinal, thoracic 12/15/2014   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  ONSET DATE:  Suddenly started having weakness 11 years ago, most recent episode of worsening October 2022, s/p right L4-5 laminotomy, microdiscectomy on 10/06/2021, now s/p L4-5 PLIF by Dr. Peru on 05/25/2022.  PERTINENT HISTORY:  Patient is a 43 y.o. male who presents to outpatient physical therapy with a referral for medical diagnosis thoracic disc disease with myelopathy, nerve pain, adhesive arachnoiditis. This patient's chief complaints consist of disabling low back and R >L leg pain and weakness with activity, bowel and bladder urgency, incontinence, and retention, leading to the following functional deficits: difficulty with or unable to complete any activity that requires weight bearing, use of B LE, and/or balance including working, household and community mobility, walking, driving, going to family gatherings, avoiding incontinent episodes, playing with daughter, helping around the house, bed mobility, transfers. 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 Gillie on 05/25/2022, adhesive arachnoiditis, nerve pain.  Patient denies hx of cancer, stroke, seizures, lung problems, heart problems, diabetes, unexplained weight loss, and osteoporosis.  SUBJECTIVE:                                                                                                                                                                                           SUBJECTIVE STATEMENT:     Patient states he is doing pretty good today. He fell since last PT session when he was bending over to get laundry. The dryer is low. Something in the R LE pinched and then his fell. He usually does this sitting, but was trying to do it standing. He does think the medications are helping him with pain but it makes it harder to tell where his limits are. He was sore after last PT session and spent more time in the bed but was not restricted to the bed and his R LE continued to support him.  He has continued to work on driving. He is working on keeping his right foot on the pedal for longer. It continues to try to move right off the brake and onto the gas. He has to use his hand to move his leg when this happens.   PAIN:  NPRS:  2/10 mostly in low back, right groin/hip, bilateral posterior thighs, right calf.   PRECAUTIONS: Fall  PATIENT GOALS: To get better, walk, drive again, climb stairs, play with my daughter, help around the house again  OBJECTIVE  TREATMENT  Neuromuscular Re-education: a technique or exercise performed with the goal of improving the level of communication between the body and the brain, such as for  balance, motor control, muscle activation patterns, coordination, desensitization, quality of muscle contraction, proprioception, and/or kinesthetic sense needed for successful and safe completion of functional activities.   Standing laying trunk on plinth with arms holding on and legs supporting some from floor, hip crease at edge of plinth.  Slight PPT hold with hip extension:  1x10 each side Unable to do alternating  High kneeling on bench with abdomen over plinth at hip level:  Slight PPT with hip extension:  3x10 each side  Therapeutic activities: dynamic therapeutic activities incorporating MULTIPLE parameters or  areas of the body designed to achieve improved functional performance.  Sled push 2x30 feet sled only (75#) 2x30 feet sled + 25# (100# total)  Therapeutic exercise: therapeutic exercises that incorporate ONE parameter at one or more areas of the body to centralize symptoms, develop strength and endurance, range of motion, and flexibility required for successful completion of functional activities.  Seated trunk rotation against BlueTB looped behind ipsilateral shoulder and held by PT 2x10 each way Painful but not intolerable  Pt required multimodal cuing for proper technique and to facilitate improved neuromuscular control, strength, range of motion, and functional ability resulting in improved performance and form.  PATIENT EDUCATION:  Education details: Exercise purpose/form. Self management techniques. POC.  Person educated: patient Education method: Explanation Education comprehension: verbalized understanding and needs further education  HOME EXERCISE PROGRAM: Verbally:  - seated lumbar flexion stretch with head down and one LE extended using rollator, 1x10 each side.  - curl up with legs elevated, 5x15 seconds - sidelying open book 1x10 each side  Access Code: [email or web address] URL: [email or web address] Date: 12/18/2023 Prepared by: Camie Cleverly  Exercises - Seated Diaphragmatic Breathing  - 3 reps - 1 minute practice time - Seated Pelvic Floor Elevators  - 3 x daily - 2 sets - 10 reps - Seated Ankle Plantar Flexion with Resistance Loop  - 1 x daily - 3 sets - 20 reps - Seated Ankle Dorsiflexion AROM  - 1 x daily - 3 sets - 10-30 reps - Seated Ankle Inversion AROM  - 1 x daily - 3 sets - 10-30 reps - Seated Ankle Eversion AROM  - 1 x daily - 3 sets - 10-30 reps - Sidelying Ankle Eversion Strengthening with Ankle Weight  - 1 x daily - 3 sets - 20 reps - Sidelying Ankle Inversion with Ankle Weight  - 1 x daily - 3 sets - 20 reps - Seated Ankle Dorsiflexion with Ankle Weight  - 1 x daily - 3 sets - 20 reps  ASSESSMENT:  CLINICAL IMPRESSION:     Patient ambulating with BRW today. Continued with plan to attempt prone/quadruped motor control exercises to improve lumbopelvic control with hip movement. Patient found all attempts painful but not intolerable. He was too weak in his hips and core to perform in quadruped or various attempts at alternate positions until he performed hip extension while leaning trunk over table with UE support. He was still limited by decreased LE strength but was able to perform up to 6 reps at a time with tolerable discomfort. He tolerated resisted trunk rotations with theraband and continued with supine legs elevated core exercises. Patient would benefit from continued management of limiting condition by skilled physical therapist to address remaining impairments and functional limitations to work towards stated goals and return to PLOF or maximal functional independence.     OBJECTIVE IMPAIRMENTS: Abnormal gait, decreased activity tolerance, decreased balance, decreased coordination, decreased endurance, decreased knowledge of condition, decreased knowledge of use of DME, decreased mobility, difficulty walking, decreased ROM, decreased strength, increased edema, impaired perceived functional ability,  increased muscle spasms, impaired flexibility, impaired sensation, impaired tone, improper body mechanics, postural dysfunction, obesity, and pain.   ACTIVITY LIMITATIONS: carrying, lifting, bending, standing, squatting, stairs, transfers, bed mobility, continence, bathing, toileting, dressing, hygiene/grooming, locomotion level, and caring for others  PARTICIPATION LIMITATIONS: meal prep, cleaning, laundry, interpersonal relationship, driving, shopping, community activity, occupation, yard work, and  difficulty with or unable to complete any activity that requires weight bearing, use of B LE, and/or  balance including working, household and community mobility, walking, driving, going to family gatherings, avoiding incontinent episodes, playing with daughter, helping around the house, bed mobility, transfers  PERSONAL FACTORS: Past/current experiences, Time since onset of injury/illness/exacerbation, and 3+ comorbidities:  3 thoracic spine surgeries, thoracic MRI notes 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 Gillie on 05/25/2022, adhesive arachnoiditis, nerve pain are also affecting patient's functional outcome.   REHAB POTENTIAL: Fair due to severity and nature of condition.   CLINICAL DECISION MAKING: Evolving/moderate complexity  EVALUATION COMPLEXITY: Moderate   GOALS: Goals reviewed with patient? No  SHORT TERM GOALS: Target date: 01/09/2023. Target date updated to 05/28/2023 for all unmet goals on 03/05/2023.   Patient will be independent with initial home exercise program for self-management of symptoms. Baseline: Initial HEP to be provided at visit 2 as appropriate  (12/26/22); Goal status: MET  LONG TERM GOALS: Target date: 03/20/2023. Target date updated to 08/26/23  for all unmet goals on 06/03/2023. Target date updated to 11/20/2023 for all  unmet goals on 08/28/2023. Target date updated to 02/17/2024  for all unmet goals on 11/25/2023. Target date updated to 04/14/2024 for all unmet goals on 01/22/2024.   Patient will be independent with a long-term home exercise program for self-management of symptoms.  Baseline: Initial HEP to be provided at visit 2 as appropriate (12/26/22); participating as able (01/31/2023); patient currently participating as tolerated (03/05/2023; 04/09/2023; 06/03/2023); participates daily in mat exercises and in weight bearing exercises as tolerated (10/08/2023; 11/25/2023); Tries to do daily but sometimes cannot when his body won't let him (01/22/2024);  Goal status: In-progress  2.  Patient will demonstrate improved FOTO by equal or greater than 10 points by visit #13 to demonstrate improvement in overall condition and self-reported functional ability.  Baseline: to be tested visit 2 as appropriate (12/26/22); 30 at visit #3 (01/02/2023); 46 at visit #13 (03/05/2023); 45 at visit #29 (06/03/2023); 49 at visit #50 (10/08/2023);  Goal status: MET  3.  Patient will demonstrate the ability to ambulate equal or greater than 600 feet with LRAD during the 6 minute walk to improve his household and community mobility.  Baseline: 34 feet with bari-RW (12/26/22); 200 feet with bari-RW (01/31/2023); 500 feet with BRW (03/05/2023); 400 feet with BRW (04/09/2023); 152 feet with BRW and W/C follow for safety (06/03/2023); 330 feet with BRW (10/08/2023); 245 feet with BRW and SBA for safety (11/25/2023); 343 feet with BRW and SBA for safety (01/22/2024);  Goal status: In progress  4.  Patient will complete 5 Time Sit To Stand Test from 19.5 inch surface or lower in equal or less than 15 seconds with no UE support to demonstrate improved transfer ability for improved  household and toileting mobility.  Baseline: 23 seconds with heavy B UE support on RW from 19.5 inch plinth. Pain throbbing down the right LE.  (12/26/22); 19 seconds with heavy B UE support on RW from 19.5 inch plinth (03/05/2023); 20 seconds with heavy B UE support on RRW from 18.5 inch plinth. Painful in back of B calves, thighs, and lower back (04/09/2023); 15 seconds with heavy B UE support on BRW from 18.5 inch plinth. Painful in back of B calves, thighs, and lower back (06/03/2023); 19  seconds with heavy B UE support on BRW from 18.5 inch plinth. Painful in back of B calves, thighs, and lower back (10/08/2023); 18 seconds with heavy B UE support on BRW from 18.5 inch plinth. Painful in back of B calves, thighs, and lower back (11/25/2023); 15seconds with heavy B UE support on BRW from 18.5 inch plinth. Painful in back of B calves, thighs, and lower back (01/22/2024);  Goal status: Progressing   5.  Patient will report worst pain equal or less than 3/10 with functional activities to improve his ability to complete basic household and community mobility. Baseline: up to 8/10 (12/26/22); reports 3/10 pain (01/31/2023); reports 4/10 pain (02/19/2023); up to 7/10 in the last 2 weeks (03/05/2023): up to  7/10 in the last two weeks (04/09/2023); up to 8/10 in the last 2 weeks (06/03/2023);  up to 9/10 over the last 2 weeks (10/08/2023); up to 7-8/10 in the last 2 weeks (11/25/2023); 7/10 in the last 2 weeks (01/22/2024);  Goal status: Ongoing  6.  Patient will demonstrate improvement in Patient Specific Functional Scale (PSFS) by equal or greater than 3/10 points to reflect clinically significant improvement in patient's most valued functional activities. Baseline: 1.7/10 for Walking Around [address], Enjoying Life, and Driving (97/74/74); 7.2/89 (11/25/2023); 2.7/10 (01/22/24);  Goal status: progressing  PLAN:  PT FREQUENCY: 1-2x/week PT DURATION: 12 weeks PLANNED INTERVENTIONS: Therapeutic exercises, Therapeutic  activity, Neuromuscular re-education, Balance training, Gait training, Patient/Family education, Self Care, Joint mobilization, Stair training, Orthotic/Fit training, DME instructions, Aquatic Therapy, Dry Needling, Electrical stimulation, Wheelchair mobility training, Spinal mobilization, Cryotherapy, Moist heat, Manual therapy, and Re-evaluation.  PLAN FOR NEXT SESSION:  update HEP as appropriate, neurodynamics, gait training, LE/core/functional strengthening, stretching, and balance as tolerated, education, manual therapy as needed. Pelvic floor.   Camie SAUNDERS. Juli, PT, DPT, Cert. MDT 03/17/24, 4:57 PM  [facility] [address] [place], [place] [address] P: [phone] I F: [phone]

## 2024-03-18 ENCOUNTER — Encounter: Payer: Self-pay | Admitting: Physical Therapy

## 2024-03-19 ENCOUNTER — Ambulatory Visit: Admitting: Physical Therapy

## 2024-03-24 ENCOUNTER — Ambulatory Visit: Admitting: Physical Therapy

## 2024-03-26 ENCOUNTER — Ambulatory Visit: Admitting: Physical Therapy

## 2024-03-30 ENCOUNTER — Ambulatory Visit: Admitting: Physical Therapy

## 2024-03-30 ENCOUNTER — Encounter: Payer: Self-pay | Admitting: Physical Therapy

## 2024-03-30 DIAGNOSIS — M5416 Radiculopathy, lumbar region: Secondary | ICD-10-CM

## 2024-03-30 DIAGNOSIS — R262 Difficulty in walking, not elsewhere classified: Secondary | ICD-10-CM

## 2024-03-30 DIAGNOSIS — M5459 Other low back pain: Secondary | ICD-10-CM

## 2024-03-30 DIAGNOSIS — R29818 Other symptoms and signs involving the nervous system: Secondary | ICD-10-CM

## 2024-03-30 DIAGNOSIS — R296 Repeated falls: Secondary | ICD-10-CM

## 2024-03-30 DIAGNOSIS — M6281 Muscle weakness (generalized): Secondary | ICD-10-CM

## 2024-03-30 NOTE — Therapy (Signed)
 OUTPATIENT PHYSICAL THERAPY TREATMENT  Patient Name: Travis Fitzpatrick MRN: [id number] DOB:1980-10-15, 43 y.o., male Today's Date: 03/30/24   END OF SESSION:  PT End of Session - 03/30/24 1650     Visit Number 81    Number of Visits 93    Date for PT Re-Evaluation 04/15/24    Authorization Type MEDICARE PART B reporting period from 03/18/24    Progress Note Due on Visit 80    PT Start Time 1648    PT Stop Time 1729    PT Time Calculation (min) 41 min    Activity Tolerance Patient limited by pain;Patient tolerated treatment well    Behavior During Therapy [name] for tasks assessed/performed              Past Medical History:  Diagnosis Date   Bowel trouble    urgency   Medical history non-contributory    Urinary urgency    Past Surgical History:  Procedure Laterality Date   BACK SURGERY  2010   CIRCUMCISION     LUMBAR LAMINECTOMY/DECOMPRESSION MICRODISCECTOMY  07/20/2011   Procedure: LUMBAR LAMINECTOMY/DECOMPRESSION MICRODISCECTOMY;  Surgeon: Travis Fitzpatrick;  Location: [facility];  Service: Neurosurgery;  Laterality: N/A;  right thoracotomy with thoracic eight-nine discectomy and fusion   LUMBAR LAMINECTOMY/DECOMPRESSION MICRODISCECTOMY Right 10/06/2021   Procedure: Right Lumbar Four-Five Microdiscectomy, Right Lumbar Five-Sacal One Foraminotomy;  Surgeon: Fitzpatrick Rockey, MD;  Location: [facility];  Service: Neurosurgery;  Laterality: Right;  3C/RM 21   THORACIC DISCECTOMY  07/16/2012   Procedure: THORACIC DISCECTOMY;  Surgeon: Travis LITTIE Peru, MD;  Location: [facility];  Service: Neurosurgery;  Laterality: Right;  RIGHT Thoracic seven-eight  thoracic diskectomy via thoracotomy by dr Fitzpatrick   THORACIC DISCECTOMY N/A 12/15/2014   Procedure: THORACIC SEVEN TO THORACIC NINE Laminectomy ;  Surgeon: Travis Peru, MD;  Location: [facility];  Service: Neurosurgery;  Laterality: N/A;   THORACIC DISCECTOMY N/A 06/06/2017   Procedure: LAMINECTOMY THORACIC NINE-TEN;  Surgeon: Fitzpatrick Rockey,  MD;  Location: [facility];  Service: Neurosurgery;  Laterality: N/A;  LAMINECTOMY THORACIC NINE-TEN   THORACOTOMY  07/20/2011   Procedure: THORACOTOMY OPEN FOR SPINE SURGERY;  Surgeon: Travis Belvie Nam, MD;  Location: [facility];  Service: Vascular;  Laterality: N/A;   THORACOTOMY  07/16/2012   Procedure: THORACOTOMY OPEN FOR SPINE SURGERY;  Surgeon: Travis MARLA lucas, MD;  Location: [facility];  Service: Thoracic;  Laterality: N/A;   Patient Active Problem List   Diagnosis Date Noted   Carpal tunnel syndrome on right 02/19/2024   Spasticity 09/25/2023   Nerve pain 12/05/2022   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   S/P lumbar spinal fusion 05/25/2022   Synovial cyst of lumbar facet joint 05/25/2022   HNP (herniated nucleus pulposus), lumbar 10/06/2021   Hand pain 06/28/2021   Lumbar facet arthropathy 01/11/2021   Thoracic spondylosis with myelopathy 01/11/2021   Thoracic spinal stenosis 06/06/2017   Stenosis, spinal, thoracic 12/15/2014   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 09/22/2014   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/2011    PCP: Travis Fitzpatrick  REFERRING PROVIDER: Babs Arthea DASEN, MD  REFERRING DIAG: thoracic disc disease with myelopathy, nerve pain, adhesive arachnoiditis  Rationale for Evaluation and Treatment: Rehabilitation  THERAPY DIAG:  Other low back pain  Other symptoms and signs involving the nervous system  Radiculopathy, lumbar region  Muscle weakness (generalized)  Repeated falls  Difficulty in walking, not elsewhere classified  ONSET DATE:  Suddenly started having weakness 11 years  ago, most recent episode of worsening October 2022, s/p right L4-5 laminotomy, microdiscectomy on 10/06/2021, now s/p L4-5 PLIF by Travis Fitzpatrick on 05/25/2022.  PERTINENT HISTORY:  Patient is a 43 y.o. male who presents to outpatient physical therapy with a referral for medical diagnosis thoracic disc disease with myelopathy, nerve pain,  adhesive arachnoiditis. This patient's chief complaints consist of disabling low back and R >L leg pain and weakness with activity, bowel and bladder urgency, incontinence, and retention, leading to the following functional deficits: difficulty with or unable to complete any activity that requires weight bearing, use of B LE, and/or balance including working, household and community mobility, walking, driving, going to family gatherings, avoiding incontinent episodes, playing with daughter, helping around the house, bed mobility, transfers. 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  Patient denies hx of cancer, stroke, seizures, lung problems, heart problems, diabetes, unexplained weight loss, and osteoporosis.  SUBJECTIVE:                                                                                                                                                                                           SUBJECTIVE STATEMENT:     Patient states he is doing okay today. He had a flair in symptoms the day after last PT session that kept him in the bed until last Thursday (about a week). He tried to come to PT last Thursday and actually fell trying to get off the porch so he had to call and cancel. He had the dizziness, nausea, and sensitivity to the ligth, etc during this time as well. Travis Fitzpatrick   PAIN:  NPRS:  2/10 mostly in low back, right groin/hip, bilateral posterior thighs, right calf. R leg feels like it is going to give out when he  steps on it.   PRECAUTIONS: Fall  PATIENT GOALS: To get better, walk, drive again, climb stairs, play with my daughter, help around the house again  OBJECTIVE  TREATMENT  Therapeutic exercise: therapeutic exercises that incorporate ONE parameter at one or more areas of the body to centralize symptoms, develop strength and endurance, range of motion, and flexibility required for successful completion of functional activities.  Seated LAQ on elevated plinth with AW+TB loop R:  3x10 with  17.5#AW + BlueTB loop anchored at base of plinth (max due to pain) L:         3x10 each side with 17.5#AW + BlueTB loop anchored at base of plinth   Neuromuscular Re-education: a technique or exercise performed with the goal of improving the level of communication between the body and the brain, such as for balance, motor control, muscle activation patterns, coordination, desensitization, quality of muscle contraction, proprioception, and/or kinesthetic sense needed for successful and safe completion of functional activities.   High kneeling on bench with abdomen over plinth (with or without elbow support): Slight PPT with hip extension  3x10 each side Difficult with weight bearing on R side, at first limited to only one set with weight bearing on R LE.  Seated rest then able to finish reps after discovering pt feels better without PPT   Education on nerve tension positions in context of neural tension sensitivity and load sensitivity.   Therapeutic activities: dynamic therapeutic activities incorporating MULTIPLE parameters or areas of the body designed to achieve improved functional performance.  UE support squats at squat cage (holding on to BB to support bodyweight and offload lumbar spine and LE). Chair placed behind.  1x10 with support bar 1x10 with BB at 3rd hole down (best) 1x5 with BB at 4th hole down (more pain and feels less secure)    Pt required multimodal cuing for proper technique  and to facilitate improved neuromuscular control, strength, range of motion, and functional ability resulting in improved performance and form.  PATIENT EDUCATION:  Education details: Exercise purpose/form. Self management techniques. POC.  Person educated: patient Education method: Explanation Education comprehension: verbalized understanding and needs further education  HOME EXERCISE PROGRAM: Verbally:  - seated lumbar flexion stretch with head down and one LE extended using rollator, 1x10 each side.  - curl up with legs elevated, 5x15 seconds - sidelying open book 1x10 each side  Access Code: [email or web address] URL: [email or web address] Date: 12/18/2023 Prepared by: Camie Cleverly  Exercises - Seated Diaphragmatic Breathing  - 3 reps - 1 minute practice time - Seated Pelvic Floor Elevators  - 3 x daily - 2 sets - 10 reps - Seated Ankle Plantar Flexion with Resistance Loop  - 1 x daily - 3 sets - 20 reps - Seated Ankle Dorsiflexion AROM  - 1 x daily - 3 sets - 10-30 reps - Seated Ankle Inversion AROM  - 1 x daily - 3 sets - 10-30 reps - Seated Ankle Eversion AROM  - 1 x daily - 3 sets - 10-30 reps - Sidelying Ankle Eversion Strengthening with Ankle Weight  - 1 x daily - 3 sets - 20 reps - Sidelying Ankle Inversion with Ankle Weight  - 1 x daily - 3 sets - 20 reps - Seated Ankle Dorsiflexion with Ankle Weight  - 1 x daily - 3 sets - 20 reps  ASSESSMENT:  CLINICAL IMPRESSION:     Patient ambulating with BRW today. He missed his last 3 physical therapy therapy sessions due to being stuck in the bed related to increased pain and intolerance to R LE weight bearing with pain and weakness. He was very motivated to continue weight bearing activities and wants to improve his ability to walk. He has less pain with bodyweight offloading in standing, and was able to tolerate squats unloaded with UE support at squat cage. He also continued with hip extension in nerve tension limited position,  but continued to have limitations with R sided wieght bearing. This  improved with relaxation of PPT tilt in this position. Patient would benefit from continued management of limiting condition by skilled physical therapist to address remaining impairments and functional limitations to work towards stated goals and return to PLOF or maximal functional independence.    OBJECTIVE IMPAIRMENTS: Abnormal gait, decreased activity tolerance, decreased balance, decreased coordination, decreased endurance, decreased knowledge of condition, decreased knowledge of use of DME, decreased mobility, difficulty walking, decreased ROM, decreased strength, increased edema, impaired perceived functional ability, increased muscle spasms, impaired flexibility, impaired sensation, impaired tone, improper body mechanics, postural dysfunction, obesity, and pain.   ACTIVITY LIMITATIONS: carrying, lifting, bending, standing, squatting, stairs, transfers, bed mobility, continence, bathing, toileting, dressing, hygiene/grooming, locomotion level, and caring for others  PARTICIPATION LIMITATIONS: meal prep, cleaning, laundry, interpersonal relationship, driving, shopping, community activity, occupation, yard work, and  difficulty with or unable to complete any activity that requires weight bearing, use of B LE, and/or balance including working, household and community mobility, walking, driving, going to family gatherings, avoiding incontinent episodes, playing with daughter, helping around the house, bed mobility, transfers  PERSONAL FACTORS: Past/current experiences, Time since onset of injury/illness/exacerbation, and 3+ comorbidities:  3 thoracic spine surgeries, thoracic MRI notes 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   REHAB POTENTIAL: Fair due to severity and nature of condition.   CLINICAL DECISION MAKING: Evolving/moderate complexity  EVALUATION COMPLEXITY: Moderate   GOALS: Goals reviewed with patient? No  SHORT TERM GOALS: Target date: 01/09/2023. Target date updated to 05/28/2023 for all unmet goals on 03/05/2023.   Patient will be independent with initial home exercise program for self-management of symptoms. Baseline: Initial HEP to be provided at visit 2 as appropriate (12/26/22); Goal status: MET  LONG TERM GOALS: Target date: 03/20/2023. Target date updated to 08/26/23  for all unmet goals on 06/03/2023. Target date updated to 11/20/2023 for all  unmet goals on 08/28/2023. Target date updated to 02/17/2024  for all unmet goals on 11/25/2023. Target date updated to 04/14/2024 for all unmet goals on 01/22/2024.   Patient will be independent with a long-term home exercise program for self-management of symptoms.  Baseline: Initial HEP to be provided at visit 2 as appropriate (12/26/22); participating as able (01/31/2023); patient currently participating as tolerated (03/05/2023; 04/09/2023; 06/03/2023); participates daily in mat exercises and in weight bearing exercises as tolerated (10/08/2023; 11/25/2023); Tries to do daily but sometimes cannot when his body won't let him (01/22/2024); participating as able including activities he finds way to mimic at home that he learns in clinic (03/17/2024);  Goal status: In-progress  2.  Patient will demonstrate improved FOTO by equal or greater than 10 points by visit #13 to demonstrate improvement in overall condition and self-reported functional ability.  Baseline: to be tested visit 2 as appropriate (12/26/22); 30 at visit #3 (01/02/2023); 46 at visit #13  (03/05/2023); 45 at visit #29 (06/03/2023); 49 at visit #50 (10/08/2023);  Goal status: MET  3.  Patient will demonstrate the ability to ambulate equal or greater than 600 feet with LRAD during the 6 minute walk to improve his household and community mobility.  Baseline: 34 feet with bari-RW (12/26/22); 200 feet with  bari-RW (01/31/2023); 500 feet with BRW (03/05/2023); 400 feet with BRW (04/09/2023); 152 feet with BRW and W/C follow for safety (06/03/2023); 330 feet with BRW (10/08/2023); 245 feet with BRW and SBA for safety (11/25/2023); 343 feet with BRW and SBA for safety (01/22/2024);  Goal status: In progress  4.  Patient will complete 5 Time Sit To Stand Test from 19.5 inch surface or lower in equal or less than 15 seconds with no UE support to demonstrate improved transfer ability for improved household and toileting mobility.  Baseline: 23 seconds with heavy B UE support on RW from 19.5 inch plinth. Pain throbbing down the right LE.  (12/26/22); 19 seconds with heavy B UE support on RW from 19.5 inch plinth (03/05/2023); 20 seconds with heavy B UE support on RRW from 18.5 inch plinth. Painful in back of B calves, thighs, and lower back (04/09/2023); 15 seconds with heavy B UE support on BRW from 18.5 inch plinth. Painful in back of B calves, thighs, and lower back (06/03/2023); 19 seconds with heavy B UE support on BRW from 18.5 inch plinth. Painful in back of B calves, thighs, and lower back (10/08/2023); 18 seconds with heavy B UE support on BRW from 18.5 inch plinth. Painful in back of B calves, thighs, and lower back (11/25/2023); 15seconds with heavy B UE support on BRW from 18.5 inch plinth. Painful in back of B calves, thighs, and lower back (01/22/2024);  Goal status: Progressing   5.  Patient will report worst pain equal or less than 3/10 with functional activities to improve his ability to complete basic household and community mobility. Baseline: up to 8/10 (12/26/22); reports 3/10 pain  (01/31/2023); reports 4/10 pain (02/19/2023); up to 7/10 in the last 2 weeks (03/05/2023): up to  7/10 in the last two weeks (04/09/2023); up to 8/10 in the last 2 weeks (06/03/2023);  up to 9/10 over the last 2 weeks (10/08/2023); up to 7-8/10 in the last 2 weeks (11/25/2023); 7/10 in the last 2 weeks (01/22/2024); better controlled in last 2 weeks but no specific number obtained (03/18/2024);  Goal status: Ongoing  6.  Patient will demonstrate improvement in Patient Specific Functional Scale (PSFS) by equal or greater than 3/10 points to reflect clinically significant improvement in patient's most valued functional activities. Baseline: 1.7/10 for Walking Around [address], Enjoying Life, and Driving (97/74/74); 7.2/89 (11/25/2023); 2.7/10 (01/22/24);  Goal status: progressing  PLAN:  PT FREQUENCY: 1-2x/week PT DURATION: 12 weeks PLANNED INTERVENTIONS: Therapeutic exercises, Therapeutic activity, Neuromuscular re-education, Balance training, Gait training, Patient/Family education, Self Care, Joint mobilization, Stair training, Orthotic/Fit training, DME instructions, Aquatic Therapy, Dry Needling, Electrical stimulation, Wheelchair mobility training, Spinal mobilization, Cryotherapy, Moist heat, Manual therapy, and Re-evaluation.  PLAN FOR NEXT SESSION:  update HEP as appropriate, neurodynamics, gait training, LE/core/functional strengthening, stretching, and balance as tolerated, education, manual therapy as needed. Pelvic floor.   Camie SAUNDERS. Juli, PT, DPT, Cert. MDT 03/30/24, 6:44 PM  [facility] [address] [place], [place] [address] P: [phone] I F: [phone]

## 2024-04-01 ENCOUNTER — Encounter: Payer: Self-pay | Admitting: Physical Therapy

## 2024-04-01 ENCOUNTER — Ambulatory Visit: Admitting: Physical Therapy

## 2024-04-01 DIAGNOSIS — R296 Repeated falls: Secondary | ICD-10-CM

## 2024-04-01 DIAGNOSIS — M6281 Muscle weakness (generalized): Secondary | ICD-10-CM

## 2024-04-01 DIAGNOSIS — M5416 Radiculopathy, lumbar region: Secondary | ICD-10-CM

## 2024-04-01 DIAGNOSIS — M5459 Other low back pain: Secondary | ICD-10-CM | POA: Diagnosis not present

## 2024-04-01 DIAGNOSIS — R262 Difficulty in walking, not elsewhere classified: Secondary | ICD-10-CM

## 2024-04-01 DIAGNOSIS — R29818 Other symptoms and signs involving the nervous system: Secondary | ICD-10-CM

## 2024-04-01 NOTE — Therapy (Signed)
 OUTPATIENT PHYSICAL THERAPY TREATMENT  Patient Name: Travis Fitzpatrick MRN: [id number] DOB:1981-05-11, 43 y.o., male Today's Date: 04/01/24   END OF SESSION:  PT End of Session - 04/01/24 1702     Visit Number 82    Number of Visits 93    Date for PT Re-Evaluation 04/15/24    Authorization Type MEDICARE PART B reporting period from 03/18/24    Progress Note Due on Visit 80    PT Start Time 1656    PT Stop Time 1734    PT Time Calculation (min) 38 min    Activity Tolerance Patient limited by pain;Patient tolerated treatment well    Behavior During Therapy [facility] for tasks assessed/performed               Past Medical History:  Diagnosis Date   Bowel trouble    urgency   Medical history non-contributory    Urinary urgency    Past Surgical History:  Procedure Laterality Date   BACK SURGERY  2010   CIRCUMCISION     LUMBAR LAMINECTOMY/DECOMPRESSION MICRODISCECTOMY  07/20/2011   Procedure: LUMBAR LAMINECTOMY/DECOMPRESSION MICRODISCECTOMY;  Surgeon: Rockey LITTIE Peru;  Location: [facility];  Service: Neurosurgery;  Laterality: N/A;  right thoracotomy with thoracic eight-nine discectomy and fusion   LUMBAR LAMINECTOMY/DECOMPRESSION MICRODISCECTOMY Right 10/06/2021   Procedure: Right Lumbar Four-Five Microdiscectomy, Right Lumbar Five-Sacal One Foraminotomy;  Surgeon: Peru Rockey, MD;  Location: [facility];  Service: Neurosurgery;  Laterality: Right;  3C/RM 21   THORACIC DISCECTOMY  07/16/2012   Procedure: THORACIC DISCECTOMY;  Surgeon: Rockey LITTIE Peru, MD;  Location: [facility];  Service: Neurosurgery;  Laterality: Right;  RIGHT Thoracic seven-eight  thoracic diskectomy via thoracotomy by dr lucas   THORACIC DISCECTOMY N/A 12/15/2014   Procedure: THORACIC SEVEN TO THORACIC NINE Laminectomy ;  Surgeon: Rockey Peru, MD;  Location: [facility];  Service: Neurosurgery;  Laterality: N/A;   THORACIC DISCECTOMY N/A 06/06/2017   Procedure: LAMINECTOMY THORACIC NINE-TEN;  Surgeon: Peru Rockey, MD;  Location: [facility];  Service: Neurosurgery;  Laterality: N/A;  LAMINECTOMY THORACIC NINE-TEN   THORACOTOMY  07/20/2011   Procedure: THORACOTOMY OPEN FOR SPINE SURGERY;  Surgeon: JONETTA Belvie Nam, MD;  Location: [facility];  Service: Vascular;  Laterality: N/A;   THORACOTOMY  07/16/2012   Procedure: THORACOTOMY OPEN FOR SPINE SURGERY;  Surgeon: Dorise MARLA lucas, MD;  Location: [facility];  Service: Thoracic;  Laterality: N/A;   Patient Active Problem List   Diagnosis Date Noted   Carpal tunnel syndrome on right 02/19/2024   Spasticity 09/25/2023   Nerve pain 12/05/2022   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   S/P lumbar spinal fusion 05/25/2022   Synovial cyst of lumbar facet joint 05/25/2022   HNP (herniated nucleus pulposus), lumbar 10/06/2021   Hand pain 06/28/2021   Lumbar facet arthropathy 01/11/2021   Thoracic spondylosis with myelopathy 01/11/2021   Thoracic spinal stenosis 06/06/2017   Stenosis, spinal, thoracic 12/15/2014   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 09/22/2014   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/2011    PCP: Maryl Sayres  REFERRING PROVIDER: Babs Arthea DASEN, MD  REFERRING DIAG: thoracic disc disease with myelopathy, nerve pain, adhesive arachnoiditis  Rationale for Evaluation and Treatment: Rehabilitation  THERAPY DIAG:  Other low back pain  Other symptoms and signs involving the nervous system  Radiculopathy, lumbar region  Muscle weakness (generalized)  Repeated falls  Difficulty in walking, not elsewhere classified  ONSET DATE:  Suddenly started having weakness 11  years ago, most recent episode of worsening October 2022, s/p right L4-5 laminotomy, microdiscectomy on 10/06/2021, now s/p L4-5 PLIF by Dr. Gillie on 05/25/2022.  PERTINENT HISTORY:  Patient is a 43 y.o. male who presents to outpatient physical therapy with a referral for medical diagnosis thoracic disc disease with myelopathy, nerve  pain, adhesive arachnoiditis. This patient's chief complaints consist of disabling low back and R >L leg pain and weakness with activity, bowel and bladder urgency, incontinence, and retention, leading to the following functional deficits: difficulty with or unable to complete any activity that requires weight bearing, use of B LE, and/or balance including working, household and community mobility, walking, driving, going to family gatherings, avoiding incontinent episodes, playing with daughter, helping around the house, bed mobility, transfers. 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 Gillie on 05/25/2022, adhesive arachnoiditis, nerve pain.  Patient denies hx of cancer, stroke, seizures, lung problems, heart problems, diabetes, unexplained weight loss, and osteoporosis.  SUBJECTIVE:                                                                                                                                                                                           SUBJECTIVE STATEMENT:     Patient states he is doing okay. He had some increased muscle soreness and increased pain after last PT session but he did not get stuck in the bed.   PAIN:  NPRS:  2/10 mostly in low back, right groin/hip, bilateral posterior thighs, right calf.  PRECAUTIONS: Fall  PATIENT GOALS: To get better, walk, drive again, climb stairs, play with my daughter, help around the house again  OBJECTIVE  TREATMENT  Therapeutic activities: dynamic therapeutic activities incorporating MULTIPLE parameters  or areas of the body designed to achieve improved functional performance.  UE support squats at squat cage (holding on to BB to support bodyweight and offload lumbar spine and LE). Chair placed behind, RW in front.   1x8 with BB at 2rd hole down  1x7 with BB at 2rd hole down  1x5 with BB at 3rd hole down (best position) 1x5 with BB at 3rd hole down 1x7 with BB at 3rd hole down  1x7 with BB at 3rd hole down  Seated rests between sets  Perching (glutes propped on elevated plinth with  feet on floor, knees below hips) hip flexion 1x10 each side marching 2x10 each side repeaters RW in front of body  UE support on RW progressing to plinth Also actively DF ankles  To improve ability to move legs on/off pedals while driving.  Neuromuscular Re-education: a technique or exercise performed with the goal of improving the level of communication between the body and the brain, such as for balance, motor control, muscle activation patterns, coordination, desensitization, quality of muscle contraction, proprioception, and/or kinesthetic sense needed for successful and safe completion of functional activities.   High kneeling hip extension on weight bench with abdomen/elbows supported on plinth, to improve lumbar stabilizing muscle activation, coordination, and hip stability 4x10 each side (repeaters, knee stays extended due to weakness bringing it back to bench) Seated break after first two sets Difficult with weight bearing on R side,  feels better without PPT    Pt required multimodal cuing for proper technique and to facilitate improved neuromuscular control, strength, range of motion, and functional ability resulting in improved performance and form.  PATIENT EDUCATION:  Education details: Exercise purpose/form. Self management techniques. POC.  Person educated: patient Education method: Explanation Education comprehension: verbalized understanding and needs further education  HOME EXERCISE  PROGRAM: Verbally:  - seated lumbar flexion stretch with head down and one LE extended using rollator, 1x10 each side.  - curl up with legs elevated, 5x15 seconds - sidelying open book 1x10 each side  Access Code: [email or web address] URL: [email or web address] Date: 12/18/2023 Prepared by: Camie Cleverly  Exercises - Seated Diaphragmatic Breathing  - 3 reps - 1 minute practice time - Seated Pelvic Floor Elevators  - 3 x daily - 2 sets - 10 reps - Seated Ankle Plantar Flexion with Resistance Loop  - 1 x daily - 3 sets - 20 reps - Seated Ankle Dorsiflexion AROM  - 1 x daily - 3 sets - 10-30 reps - Seated Ankle Inversion AROM  - 1 x daily - 3 sets - 10-30 reps - Seated Ankle Eversion AROM  - 1 x daily - 3 sets - 10-30 reps - Sidelying Ankle Eversion Strengthening with Ankle Weight  - 1 x daily - 3 sets - 20 reps - Sidelying Ankle Inversion with Ankle Weight  - 1 x daily - 3 sets - 20 reps - Seated Ankle Dorsiflexion with Ankle Weight  - 1 x daily - 3 sets - 20 reps  ASSESSMENT:  CLINICAL IMPRESSION:     Patient ambulating with BRW today. He returns with good tolerance to last PT session which included more squatting and hip extension than previously using a weight bench and squat rack to provide offloading to the spine and to avoid placing sciatic nerve on as much stretch. Patient able to continue with these exercises today with more volume with good tolerance. He did not have any lasting increase in pain but does have DOMS in his quads from last PT session. This is an excellent improvement in activity tolerance to these exercises where muscle/neuro fatigue is the limiting factor more than concordant pain. Concordant pain limiting tolerance for exercises has been a huge obstacle for patient in PT. Patient would benefit from continued management of limiting condition by skilled physical therapist to address remaining impairments and functional limitations to work towards stated goals and return  to PLOF or maximal functional independence.     OBJECTIVE IMPAIRMENTS: Abnormal gait, decreased activity tolerance, decreased balance, decreased coordination, decreased endurance, decreased knowledge of condition, decreased knowledge of use of DME,  decreased mobility, difficulty walking, decreased ROM, decreased strength, increased edema, impaired perceived functional ability, increased muscle spasms, impaired flexibility, impaired sensation, impaired tone, improper body mechanics, postural dysfunction, obesity, and pain.   ACTIVITY LIMITATIONS: carrying, lifting, bending, standing, squatting, stairs, transfers, bed mobility, continence, bathing, toileting, dressing, hygiene/grooming, locomotion level, and caring for others  PARTICIPATION LIMITATIONS: meal prep, cleaning, laundry, interpersonal relationship, driving, shopping, community activity, occupation, yard work, and  difficulty with or unable to complete any activity that requires weight bearing, use of B LE, and/or balance including working, household and community mobility, walking, driving, going to family gatherings, avoiding incontinent episodes, playing with daughter, helping around the house, bed mobility, transfers  PERSONAL FACTORS: Past/current experiences, Time since onset of injury/illness/exacerbation, and 3+ comorbidities:  3 thoracic spine surgeries, thoracic MRI notes 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 Gillie on 05/25/2022, adhesive arachnoiditis, nerve pain are also affecting patient's functional outcome.   REHAB POTENTIAL: Fair due to severity and nature of condition.   CLINICAL DECISION MAKING:  Evolving/moderate complexity  EVALUATION COMPLEXITY: Moderate   GOALS: Goals reviewed with patient? No  SHORT TERM GOALS: Target date: 01/09/2023. Target date updated to 05/28/2023 for all unmet goals on 03/05/2023.   Patient will be independent with initial home exercise program for self-management of symptoms. Baseline: Initial HEP to be provided at visit 2 as appropriate (12/26/22); Goal status: MET  LONG TERM GOALS: Target date: 03/20/2023. Target date updated to 08/26/23  for all unmet goals on 06/03/2023. Target date updated to 11/20/2023 for all  unmet goals on 08/28/2023. Target date updated to 02/17/2024  for all unmet goals on 11/25/2023. Target date updated to 04/14/2024 for all unmet goals on 01/22/2024.   Patient will be independent with a long-term home exercise program for self-management of symptoms.  Baseline: Initial HEP to be provided at visit 2 as appropriate (12/26/22); participating as able (01/31/2023); patient currently participating as tolerated (03/05/2023; 04/09/2023; 06/03/2023); participates daily in mat exercises and in weight bearing exercises as tolerated (10/08/2023; 11/25/2023); Tries to do daily but sometimes cannot when his body won't let him (01/22/2024); participating as able including activities he finds way to mimic at home that he learns in clinic (03/17/2024);  Goal status: In-progress  2.  Patient will demonstrate improved FOTO by equal or greater than 10 points by visit #13 to demonstrate improvement in overall condition and self-reported functional ability.  Baseline: to be tested visit 2 as appropriate (12/26/22); 30 at visit #3 (01/02/2023); 46 at visit #13 (03/05/2023); 45 at visit #29 (06/03/2023); 49 at visit #50 (10/08/2023);  Goal status: MET  3.  Patient will demonstrate the ability to ambulate equal or greater than 600 feet with LRAD during the 6 minute walk to improve his household and community mobility.  Baseline: 34 feet with bari-RW (12/26/22); 200 feet  with bari-RW (01/31/2023); 500 feet with BRW (03/05/2023); 400 feet with BRW (04/09/2023); 152 feet with BRW and W/C follow for safety (06/03/2023); 330 feet with BRW (10/08/2023); 245 feet with BRW and SBA for safety (11/25/2023); 343 feet with BRW and SBA for safety (01/22/2024);  Goal status: In progress  4.  Patient will complete 5 Time Sit To Stand Test from 19.5 inch surface or lower  in equal or less than 15 seconds with no UE support to demonstrate improved transfer ability for improved household and toileting mobility.  Baseline: 23 seconds with heavy B UE support on RW from 19.5 inch plinth. Pain throbbing down the right LE.  (12/26/22); 19 seconds with heavy B UE support on RW from 19.5 inch plinth (03/05/2023); 20 seconds with heavy B UE support on RRW from 18.5 inch plinth. Painful in back of B calves, thighs, and lower back (04/09/2023); 15 seconds with heavy B UE support on BRW from 18.5 inch plinth. Painful in back of B calves, thighs, and lower back (06/03/2023); 19 seconds with heavy B UE support on BRW from 18.5 inch plinth. Painful in back of B calves, thighs, and lower back (10/08/2023); 18 seconds with heavy B UE support on BRW from 18.5 inch plinth. Painful in back of B calves, thighs, and lower back (11/25/2023); 15seconds with heavy B UE support on BRW from 18.5 inch plinth. Painful in back of B calves, thighs, and lower back (01/22/2024);  Goal status: Progressing   5.  Patient will report worst pain equal or less than 3/10 with functional activities to improve his ability to complete basic household and community mobility. Baseline: up to 8/10 (12/26/22); reports 3/10 pain (01/31/2023); reports 4/10 pain (02/19/2023); up to 7/10 in the last 2 weeks (03/05/2023): up to  7/10 in the last two weeks (04/09/2023); up to 8/10 in the last 2 weeks (06/03/2023);  up to 9/10 over the last 2 weeks (10/08/2023); up to 7-8/10 in the last 2 weeks (11/25/2023); 7/10 in the last 2 weeks (01/22/2024); better  controlled in last 2 weeks but no specific number obtained (03/18/2024);  Goal status: Ongoing  6.  Patient will demonstrate improvement in Patient Specific Functional Scale (PSFS) by equal or greater than 3/10 points to reflect clinically significant improvement in patient's most valued functional activities. Baseline: 1.7/10 for Walking Around [address], Enjoying Life, and Driving (97/74/74); 7.2/89 (11/25/2023); 2.7/10 (01/22/24);  Goal status: progressing  PLAN:  PT FREQUENCY: 1-2x/week PT DURATION: 12 weeks PLANNED INTERVENTIONS: Therapeutic exercises, Therapeutic activity, Neuromuscular re-education, Balance training, Gait training, Patient/Family education, Self Care, Joint mobilization, Stair training, Orthotic/Fit training, DME instructions, Aquatic Therapy, Dry Needling, Electrical stimulation, Wheelchair mobility training, Spinal mobilization, Cryotherapy, Moist heat, Manual therapy, and Re-evaluation.  PLAN FOR NEXT SESSION:  update HEP as appropriate, neurodynamics, gait training, LE/core/functional strengthening, stretching, and balance as tolerated, education, manual therapy as needed. Pelvic floor.   Camie SAUNDERS. Juli, PT, DPT, Cert. MDT 04/01/24, 5:57 PM  [facility] [address] [place], [place] [address] P: [phone] I F: [id number]

## 2024-04-06 ENCOUNTER — Encounter: Admitting: Physical Therapy

## 2024-04-08 ENCOUNTER — Encounter: Payer: Self-pay | Admitting: Physical Therapy

## 2024-04-08 ENCOUNTER — Ambulatory Visit: Admitting: Physical Therapy

## 2024-04-08 DIAGNOSIS — R296 Repeated falls: Secondary | ICD-10-CM

## 2024-04-08 DIAGNOSIS — M5416 Radiculopathy, lumbar region: Secondary | ICD-10-CM

## 2024-04-08 DIAGNOSIS — M5459 Other low back pain: Secondary | ICD-10-CM | POA: Diagnosis not present

## 2024-04-08 DIAGNOSIS — R29818 Other symptoms and signs involving the nervous system: Secondary | ICD-10-CM

## 2024-04-08 DIAGNOSIS — R262 Difficulty in walking, not elsewhere classified: Secondary | ICD-10-CM

## 2024-04-08 DIAGNOSIS — M6281 Muscle weakness (generalized): Secondary | ICD-10-CM

## 2024-04-08 NOTE — Therapy (Signed)
 OUTPATIENT PHYSICAL THERAPY TREATMENT  Patient Name: Travis Fitzpatrick MRN: [id number] DOB:1980/11/03, 43 y.o., male Today's Date: 04/08/24   END OF SESSION:  PT End of Session - 04/08/24 1648     Visit Number 83    Number of Visits 93    Date for PT Re-Evaluation 04/15/24    Authorization Type MEDICARE PART B reporting period from 03/18/24    Progress Note Due on Visit 80    PT Start Time 1648    PT Stop Time 1726    PT Time Calculation (min) 38 min    Activity Tolerance Patient limited by pain;Patient tolerated treatment well    Behavior During Therapy [facility] for tasks assessed/performed                Past Medical History:  Diagnosis Date   Bowel trouble    urgency   Medical history non-contributory    Urinary urgency    Past Surgical History:  Procedure Laterality Date   BACK SURGERY  2010   CIRCUMCISION     LUMBAR LAMINECTOMY/DECOMPRESSION MICRODISCECTOMY  07/20/2011   Procedure: LUMBAR LAMINECTOMY/DECOMPRESSION MICRODISCECTOMY;  Surgeon: Rockey LITTIE Peru;  Location: [facility];  Service: Neurosurgery;  Laterality: N/A;  right thoracotomy with thoracic eight-nine discectomy and fusion   LUMBAR LAMINECTOMY/DECOMPRESSION MICRODISCECTOMY Right 10/06/2021   Procedure: Right Lumbar Four-Five Microdiscectomy, Right Lumbar Five-Sacal One Foraminotomy;  Surgeon: Peru Rockey, MD;  Location: [facility];  Service: Neurosurgery;  Laterality: Right;  3C/RM 21   THORACIC DISCECTOMY  07/16/2012   Procedure: THORACIC DISCECTOMY;  Surgeon: Rockey LITTIE Peru, MD;  Location: [facility];  Service: Neurosurgery;  Laterality: Right;  RIGHT Thoracic seven-eight  thoracic diskectomy via thoracotomy by dr lucas   THORACIC DISCECTOMY N/A 12/15/2014   Procedure: THORACIC SEVEN TO THORACIC NINE Laminectomy ;  Surgeon: Rockey Peru, MD;  Location: [facility];  Service: Neurosurgery;  Laterality: N/A;   THORACIC DISCECTOMY N/A 06/06/2017   Procedure: LAMINECTOMY THORACIC NINE-TEN;  Surgeon: [place] Rockey, MD;  Location: [facility];  Service: Neurosurgery;  Laterality: N/A;  LAMINECTOMY THORACIC NINE-TEN   THORACOTOMY  07/20/2011   Procedure: THORACOTOMY OPEN FOR SPINE SURGERY;  Surgeon: JONETTA Belvie Nam, MD;  Location: [facility];  Service: Vascular;  Laterality: N/A;   THORACOTOMY  07/16/2012   Procedure: THORACOTOMY OPEN FOR SPINE SURGERY;  Surgeon: Dorise MARLA lucas, MD;  Location: [facility];  Service: Thoracic;  Laterality: N/A;   Patient Active Problem List   Diagnosis Date Noted   Carpal tunnel syndrome on right 02/19/2024   Spasticity 09/25/2023   Nerve pain 12/05/2022   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   S/P lumbar spinal fusion 05/25/2022   Synovial cyst of lumbar facet joint 05/25/2022   HNP (herniated nucleus pulposus), lumbar 10/06/2021   Hand pain 06/28/2021   Lumbar facet arthropathy 01/11/2021   Thoracic spondylosis with myelopathy 01/11/2021   Thoracic spinal stenosis 06/06/2017   Stenosis, spinal, thoracic 12/15/2014   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 09/22/2014   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/2011    PCP: Maryl Sayres  REFERRING PROVIDER: Babs Arthea DASEN, MD  REFERRING DIAG: thoracic disc disease with myelopathy, nerve pain, adhesive arachnoiditis  Rationale for Evaluation and Treatment: Rehabilitation  THERAPY DIAG:  Other low back pain  Other symptoms and signs involving the nervous system  Radiculopathy, lumbar region  Muscle weakness (generalized)  Repeated falls  Difficulty in walking, not elsewhere classified  ONSET DATE:  Suddenly started having weakness  11 years ago, most recent episode of worsening October 2022, s/p right L4-5 laminotomy, microdiscectomy on 10/06/2021, now s/p L4-5 PLIF by Dr. Gillie on 05/25/2022.  PERTINENT HISTORY:  Patient is a 43 y.o. male who presents to outpatient physical therapy with a referral for medical diagnosis thoracic disc disease with myelopathy, nerve  pain, adhesive arachnoiditis. This patient's chief complaints consist of disabling low back and R >L leg pain and weakness with activity, bowel and bladder urgency, incontinence, and retention, leading to the following functional deficits: difficulty with or unable to complete any activity that requires weight bearing, use of B LE, and/or balance including working, household and community mobility, walking, driving, going to family gatherings, avoiding incontinent episodes, playing with daughter, helping around the house, bed mobility, transfers. 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 Gillie on 05/25/2022, adhesive arachnoiditis, nerve pain.  Patient denies hx of cancer, stroke, seizures, lung problems, heart problems, diabetes, unexplained weight loss, and osteoporosis.  SUBJECTIVE:                                                                                                                                                                                           SUBJECTIVE STATEMENT:     Patient states his back is hurting a little  bit but otherwise he is okay. He states his condition was not too aggravated after last PT session. Pain went from 2 to4 out of 10 but calmed down once he laid down.   PAIN:  NPRS:  2/10 mostly in low back, right groin/hip, bilateral posterior thighs, right calf.  PRECAUTIONS: Fall  PATIENT GOALS: To get better, walk, drive again, climb stairs, play with my daughter, help around the house again  OBJECTIVE  TREATMENT  Therapeutic activities:  dynamic therapeutic activities incorporating MULTIPLE parameters or areas of the body designed to achieve improved functional performance.  UE support squats at squat cage (holding on to BB to support bodyweight and offload lumbar spine and LE). Chair placed behind, RW in front, BB at 3rd hole down.  1x7 1x7  1x6 + one failed attempt (minA from PT to get to seat safely)  1x6  1x5 + one failed attempt (minA from PT to get to seat safely)  1x4 + one failed attempt (minA from PT  to get to seat safely)  Seated rests between sets  Attempting to get deeper  Practiced stepping over low bracing bar when moving out of the cage with RW, chair follow, and SBA  Perching (glutes propped on elevated plinth with feet on floor, knees below hips) hip flexion ball taps with small kid's ball 3x10 each side RW in front of body  No UE support but RW in front of body.  Also actively DF ankles  To improve ability to move legs on/off pedals while driving.  Neuromuscular Re-education: a technique or exercise performed with the goal of improving the level of communication between the body and the brain, such as for balance, motor control, muscle activation patterns, coordination, desensitization, quality of muscle contraction, proprioception, and/or kinesthetic sense needed for successful and safe completion of functional activities.   High kneeling hip extension on weight bench with abdomen/elbows supported on plinth, to improve lumbar stabilizing muscle activation, coordination, and hip stability 2x10 each side (repeaters, knee stays extended due to weakness bringing it back to bench) 1x4 R side Seated break part way through 2nd set (pain with R knee extension today). Difficult with weight bearing on R side,  Discontinued due to pain down right leg  Pt required multimodal cuing for proper technique and to facilitate improved neuromuscular control, strength, range of motion, and functional ability resulting in  improved performance and form.  PATIENT EDUCATION:  Education details: Exercise purpose/form. Self management techniques. POC.  Person educated: patient Education method: Explanation Education comprehension: verbalized understanding and needs further education  HOME EXERCISE PROGRAM: Verbally:  - seated lumbar flexion stretch with head down and one LE extended using rollator, 1x10 each side.  - curl up with legs elevated, 5x15 seconds - sidelying open book 1x10 each side  Access Code: [email or web address] URL: [email or web address] Date: 12/18/2023 Prepared by: Camie Cleverly  Exercises - Seated Diaphragmatic Breathing  - 3 reps - 1 minute practice time - Seated Pelvic Floor Elevators  - 3 x daily - 2 sets - 10 reps - Seated Ankle Plantar Flexion with Resistance Loop  - 1 x daily - 3 sets - 20 reps - Seated Ankle Dorsiflexion AROM  - 1 x daily - 3 sets - 10-30 reps - Seated Ankle Inversion AROM  - 1 x daily - 3 sets - 10-30 reps - Seated Ankle Eversion AROM  - 1 x daily - 3 sets - 10-30 reps - Sidelying Ankle Eversion Strengthening with Ankle Weight  - 1 x daily - 3 sets - 20 reps - Sidelying Ankle Inversion with Ankle Weight  - 1 x daily - 3 sets - 20 reps - Seated Ankle Dorsiflexion with Ankle Weight  - 1 x daily - 3 sets - 20 reps  ASSESSMENT:  CLINICAL IMPRESSION:     Patient ambulating with BRW today. Continued with variations on exercises from last PT session due to good tolerance continued need for the same issues to be address. Patient with more limitations due to R LE pain.  Patient would benefit from continued management of limiting condition by skilled physical therapist to address remaining impairments and functional limitations to work towards stated goals and return to PLOF or maximal functional independence.      OBJECTIVE IMPAIRMENTS: Abnormal gait, decreased activity tolerance, decreased balance, decreased coordination, decreased endurance, decreased knowledge of  condition, decreased knowledge of use of DME, decreased mobility, difficulty walking, decreased ROM, decreased strength, increased edema, impaired perceived functional ability, increased muscle spasms, impaired flexibility, impaired sensation, impaired  tone, improper body mechanics, postural dysfunction, obesity, and pain.   ACTIVITY LIMITATIONS: carrying, lifting, bending, standing, squatting, stairs, transfers, bed mobility, continence, bathing, toileting, dressing, hygiene/grooming, locomotion level, and caring for others  PARTICIPATION LIMITATIONS: meal prep, cleaning, laundry, interpersonal relationship, driving, shopping, community activity, occupation, yard work, and  difficulty with or unable to complete any activity that requires weight bearing, use of B LE, and/or balance including working, household and community mobility, walking, driving, going to family gatherings, avoiding incontinent episodes, playing with daughter, helping around the house, bed mobility, transfers  PERSONAL FACTORS: Past/current experiences, Time since onset of injury/illness/exacerbation, and 3+ comorbidities:  3 thoracic spine surgeries, thoracic MRI notes 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 Gillie on 05/25/2022, adhesive arachnoiditis, nerve pain are also affecting patient's functional outcome.   REHAB POTENTIAL: Fair due to severity and nature of condition.   CLINICAL DECISION MAKING: Evolving/moderate complexity  EVALUATION COMPLEXITY: Moderate   GOALS: Goals reviewed with patient? No  SHORT TERM GOALS: Target date: 01/09/2023. Target date updated to 05/28/2023 for all unmet goals on  03/05/2023.   Patient will be independent with initial home exercise program for self-management of symptoms. Baseline: Initial HEP to be provided at visit 2 as appropriate (12/26/22); Goal status: MET  LONG TERM GOALS: Target date: 03/20/2023. Target date updated to 08/26/23  for all unmet goals on 06/03/2023. Target date updated to 11/20/2023 for all  unmet goals on 08/28/2023. Target date updated to 02/17/2024  for all unmet goals on 11/25/2023. Target date updated to 04/14/2024 for all unmet goals on 01/22/2024.   Patient will be independent with a long-term home exercise program for self-management of symptoms.  Baseline: Initial HEP to be provided at visit 2 as appropriate (12/26/22); participating as able (01/31/2023); patient currently participating as tolerated (03/05/2023; 04/09/2023; 06/03/2023); participates daily in mat exercises and in weight bearing exercises as tolerated (10/08/2023; 11/25/2023); Tries to do daily but sometimes cannot when his body won't let him (01/22/2024); participating as able including activities he finds way to mimic at home that he learns in clinic (03/17/2024);  Goal status: In-progress  2.  Patient will demonstrate improved FOTO by equal or greater than 10 points by visit #13 to demonstrate improvement in overall condition and self-reported functional ability.  Baseline: to be tested visit 2 as appropriate (12/26/22); 30 at visit #3 (01/02/2023); 46 at visit #13 (03/05/2023); 45 at visit #29 (06/03/2023); 49 at visit #50 (10/08/2023);  Goal status: MET  3.  Patient will demonstrate the ability to ambulate equal or greater than 600 feet with LRAD during the 6 minute walk to improve his household and community mobility.  Baseline: 34 feet with bari-RW (12/26/22); 200 feet with bari-RW (01/31/2023); 500 feet with BRW (03/05/2023); 400 feet with BRW (04/09/2023); 152 feet with BRW and W/C follow for safety (06/03/2023); 330 feet with BRW (10/08/2023); 245 feet with BRW and SBA for safety  (11/25/2023); 343 feet with BRW and SBA for safety (01/22/2024);  Goal status: In progress  4.  Patient will complete 5 Time Sit To Stand Test from 19.5 inch surface or lower in equal or less than 15 seconds with no UE support to demonstrate improved transfer ability for improved household and toileting mobility.  Baseline: 23 seconds with heavy B UE support on RW from 19.5 inch plinth. Pain throbbing down the right LE.  (12/26/22); 19 seconds with heavy B UE support on RW from 19.5 inch plinth (03/05/2023); 20 seconds with heavy B UE support on RRW from 18.5 inch plinth. Painful in back of B calves, thighs, and lower back (04/09/2023); 15 seconds with heavy B UE support on BRW from 18.5 inch plinth. Painful in back of B calves, thighs, and lower back (06/03/2023); 19 seconds with heavy B UE support on BRW from 18.5 inch plinth. Painful in back of B calves, thighs, and lower back (10/08/2023); 18 seconds with heavy B UE support on BRW from 18.5 inch plinth. Painful in back of B calves, thighs, and lower back (11/25/2023); 15seconds with heavy B UE support on BRW from 18.5 inch plinth. Painful in back of B calves, thighs, and lower back (01/22/2024);  Goal status: Progressing   5.  Patient will report worst pain equal or less than 3/10 with functional activities to improve his ability to complete basic household and community mobility. Baseline: up to 8/10 (12/26/22); reports 3/10 pain (01/31/2023); reports 4/10 pain (02/19/2023); up to 7/10 in the last 2 weeks (03/05/2023): up to  7/10 in the last two weeks (04/09/2023); up to 8/10 in the last 2 weeks (06/03/2023);  up to 9/10 over the last 2 weeks (10/08/2023); up to 7-8/10 in the last 2 weeks (11/25/2023); 7/10 in the last 2 weeks (01/22/2024); better controlled in last 2 weeks but no specific number obtained (03/18/2024);  Goal status: Ongoing  6.  Patient will demonstrate improvement in Patient Specific Functional Scale (PSFS) by equal or greater than 3/10 points to  reflect clinically significant improvement in patient's most valued functional activities. Baseline: 1.7/10 for Walking Around [address], Enjoying Life, and Driving (97/74/74); 7.2/89 (11/25/2023); 2.7/10 (01/22/24);  Goal status: progressing  PLAN:  PT FREQUENCY: 1-2x/week PT DURATION: 12 weeks PLANNED INTERVENTIONS: Therapeutic exercises, Therapeutic activity, Neuromuscular re-education, Balance training, Gait training, Patient/Family education, Self Care, Joint mobilization, Stair training, Orthotic/Fit training, DME instructions, Aquatic Therapy, Dry Needling, Electrical stimulation, Wheelchair mobility training, Spinal mobilization, Cryotherapy, Moist heat, Manual therapy, and Re-evaluation.  PLAN FOR NEXT SESSION:  update HEP as appropriate, neurodynamics, gait training, LE/core/functional strengthening, stretching, and balance as tolerated, education, manual therapy as needed. Pelvic floor.   Camie SAUNDERS. Juli, PT, DPT, Cert. MDT 04/08/24, 6:03 PM  [facility] [address] [place], [place] [address] P: [phone] I F: [phone]

## 2024-04-10 ENCOUNTER — Other Ambulatory Visit: Payer: Self-pay | Admitting: Physical Medicine & Rehabilitation

## 2024-04-10 DIAGNOSIS — G039 Meningitis, unspecified: Secondary | ICD-10-CM

## 2024-04-10 DIAGNOSIS — M5104 Intervertebral disc disorders with myelopathy, thoracic region: Secondary | ICD-10-CM

## 2024-04-10 DIAGNOSIS — M792 Neuralgia and neuritis, unspecified: Secondary | ICD-10-CM

## 2024-04-15 ENCOUNTER — Ambulatory Visit: Admitting: Physical Therapy

## 2024-04-20 ENCOUNTER — Encounter: Payer: Self-pay | Admitting: Physical Therapy

## 2024-04-20 ENCOUNTER — Ambulatory Visit: Attending: Physical Medicine & Rehabilitation | Admitting: Physical Therapy

## 2024-04-20 DIAGNOSIS — M6281 Muscle weakness (generalized): Secondary | ICD-10-CM | POA: Insufficient documentation

## 2024-04-20 DIAGNOSIS — R262 Difficulty in walking, not elsewhere classified: Secondary | ICD-10-CM | POA: Diagnosis present

## 2024-04-20 DIAGNOSIS — M5459 Other low back pain: Secondary | ICD-10-CM | POA: Insufficient documentation

## 2024-04-20 DIAGNOSIS — R296 Repeated falls: Secondary | ICD-10-CM | POA: Diagnosis present

## 2024-04-20 DIAGNOSIS — M5416 Radiculopathy, lumbar region: Secondary | ICD-10-CM | POA: Diagnosis present

## 2024-04-20 DIAGNOSIS — R29818 Other symptoms and signs involving the nervous system: Secondary | ICD-10-CM | POA: Insufficient documentation

## 2024-04-20 NOTE — Therapy (Signed)
 OUTPATIENT PHYSICAL THERAPY TREATMENT / PROGRESS NOTE / RE-CERTIFICATION Dates of reporting from 01/22/2024 to 04/20/2024  Patient Name: Travis Fitzpatrick MRN: [id number] DOB:1981-01-08, 43 y.o., male Today's Date: 04/20/24   END OF SESSION:  PT End of Session - 04/20/24 1631     Visit Number 84    Number of Visits 108    Date for PT Re-Evaluation 07/13/24    Authorization Type MEDICARE PART B reporting period from 03/18/24    Progress Note Due on Visit 80    PT Start Time 1603    PT Stop Time 1645    PT Time Calculation (min) 42 min    Activity Tolerance Patient limited by pain;Patient tolerated treatment well    Behavior During Therapy [facility] for tasks assessed/performed            Past Medical History:  Diagnosis Date   Bowel trouble    urgency   Medical history non-contributory    Urinary urgency    Past Surgical History:  Procedure Laterality Date   BACK SURGERY  2010   CIRCUMCISION     LUMBAR LAMINECTOMY/DECOMPRESSION MICRODISCECTOMY  07/20/2011   Procedure: LUMBAR LAMINECTOMY/DECOMPRESSION MICRODISCECTOMY;  Surgeon: Rockey LITTIE Peru;  Location: [facility];  Service: Neurosurgery;  Laterality: N/A;  right thoracotomy with thoracic eight-nine discectomy and fusion   LUMBAR LAMINECTOMY/DECOMPRESSION MICRODISCECTOMY Right 10/06/2021   Procedure: Right Lumbar Four-Five Microdiscectomy, Right Lumbar Five-Sacal One Foraminotomy;  Surgeon: Peru Rockey, MD;  Location: [facility];  Service: Neurosurgery;  Laterality: Right;  3C/RM 21   THORACIC DISCECTOMY  07/16/2012   Procedure: THORACIC DISCECTOMY;  Surgeon: Rockey LITTIE Peru, MD;  Location: [facility];  Service: Neurosurgery;  Laterality: Right;  RIGHT Thoracic seven-eight  thoracic diskectomy via thoracotomy by dr lucas   THORACIC DISCECTOMY N/A 12/15/2014   Procedure: THORACIC SEVEN TO THORACIC NINE Laminectomy ;  Surgeon: Rockey Peru, MD;  Location: [facility];  Service: Neurosurgery;  Laterality: N/A;   THORACIC DISCECTOMY N/A  06/06/2017   Procedure: LAMINECTOMY THORACIC NINE-TEN;  Surgeon: [place] Rockey, MD;  Location: [facility];  Service: Neurosurgery;  Laterality: N/A;  LAMINECTOMY THORACIC NINE-TEN   THORACOTOMY  07/20/2011   Procedure: THORACOTOMY OPEN FOR SPINE SURGERY;  Surgeon: JONETTA Belvie Nam, MD;  Location: [facility];  Service: Vascular;  Laterality: N/A;   THORACOTOMY  07/16/2012   Procedure: THORACOTOMY OPEN FOR SPINE SURGERY;  Surgeon: Dorise MARLA lucas, MD;  Location: [facility];  Service: Thoracic;  Laterality: N/A;   Patient Active Problem List   Diagnosis Date Noted   Carpal tunnel syndrome on right 02/19/2024   Spasticity 09/25/2023   Nerve pain 12/05/2022   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   S/P lumbar spinal fusion 05/25/2022   Synovial cyst of lumbar facet joint 05/25/2022   HNP (herniated nucleus pulposus), lumbar 10/06/2021   Hand pain 06/28/2021   Lumbar facet arthropathy 01/11/2021   Thoracic spondylosis with myelopathy 01/11/2021   Thoracic spinal stenosis 06/06/2017   Stenosis, spinal, thoracic 12/15/2014   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 09/22/2014   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/2011    PCP: Maryl Sayres  REFERRING PROVIDER: Babs Arthea DASEN, MD  REFERRING DIAG: thoracic disc disease with myelopathy, nerve pain, adhesive arachnoiditis  Rationale for Evaluation and Treatment: Rehabilitation  THERAPY DIAG:  Other low back pain  Other symptoms and signs involving the nervous system  Radiculopathy, lumbar region  Muscle weakness (generalized)  Repeated falls  Difficulty in walking, not elsewhere classified  ONSET DATE:  Suddenly started having weakness 11 years ago, most recent episode of worsening October 2022, s/p right L4-5 laminotomy, microdiscectomy on 10/06/2021, now s/p L4-5 PLIF by Dr. Gillie on 05/25/2022.  PERTINENT HISTORY:  Patient is a 43 y.o. male who presents to outpatient physical therapy with a  referral for medical diagnosis thoracic disc disease with myelopathy, nerve pain, adhesive arachnoiditis. This patient's chief complaints consist of disabling low back and R >L leg pain and weakness with activity, bowel and bladder urgency, incontinence, and retention, leading to the following functional deficits: difficulty with or unable to complete any activity that requires weight bearing, use of B LE, and/or balance including working, household and community mobility, walking, driving, going to family gatherings, avoiding incontinent episodes, playing with daughter, helping around the house, bed mobility, transfers. 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 Gillie on 05/25/2022, adhesive arachnoiditis, nerve pain.  Patient denies hx of cancer, stroke, seizures, lung problems, heart problems, diabetes, unexplained weight loss, and osteoporosis.  SUBJECTIVE:                                                                                                                                                                                           SUBJECTIVE STATEMENT:     Patient states since last PT session there have been ups, downs, fell 2 times in the last 2 weeks, and hurting. He states one fall his back gave out when picking up the trash. The second time he stood up from the w/c and was putting dishes up and as soon as he reached to put the plates in the cabinet overhead something back there gave out and he fell. He denies injuries from those falls. He has not practiced driving in the last  2 weeks because he was hurting too bad.   PAIN:  NPRS:  3/10 mostly in low back, right groin/hip, bilateral posterior thighs, right calf.   PRECAUTIONS: Fall  PATIENT GOALS: To get better, walk, drive again, climb stairs, play with my daughter, help around the house again  OBJECTIVE  SELF-REPORTED FUNCTION Patient Specific Functional Scale (PSFS)  Walking Around People: 3/10 Enjoying Life: 5/10 Driving: 9/89 Average: 7.2/89   STRENGTH (MMT) (last tested 01/22/24):  Hip  Flexion: R = 2-/5, L = 3/5.  Ankle (seated position) Dorsiflexion: R = 4/5, L = 2+/5. Plantarflexion: R = 2/5, L = 3/5. Eversion: R = 4/5, L = 1/5. Inversion: R =2+/5, L = 1+/5.   FUNCTIONAL/BALANCE TESTS:   Five Time Sit to Stand (5TSTS):  Purpose: to assess LE functional strength and power for functional mobility.  Score: 14 seconds from 18.5 inch plinth heavy B UE support on BRW  Analysis: patient scored significantly worse than the 7.61.8 sec that is the norm for healthy males of his age but improved since last PT progress note. He also required heavy UE support while test norms assume UE crossed over chest. This demonstrates a significantly worse LE strength and power than would be expected for his age group and is actually worse than the norm for 73-87 year old community dwelling adults, which is 10.6 + 3.4 seconds. His score is 1 second better than was recorded last on 01/22/2024 which was 15 seconds with similar UE support. His score was better than his baseline of 23 seconds at the start of current episode of care.     6 Minute Walk Test:  Purpose: to assess function with community mobility Score: 340 feet with BRW.  Test concluded early with 2:15 min remaining due to low back, R hip, and nerve pain as well as progressive LE weakness.  Pain up to 6-7/10.   Analysis: patient ambulated significantly less than 1890 feet which is the norm for healthy males of his age, and he ambulated about the same  distance as at last progress note on 01/22/2024. He continues to be limited by low back pain, R hip pain, and neurologic pain and weakness in B LE with weight bearing activities. Pain was worst in low back and R hip groin, thighs, and R calf.    TREATMENT  Physical Performance Test or Measurement: a physical performance test or measurement with written report. 5TSTS and (see above)  Therapeutic activities: dynamic therapeutic activities incorporating MULTIPLE parameters or areas of the body designed to achieve improved functional performance.  UE support squats at squat cage (holding on to BB to support bodyweight and offload lumbar spine and LE). Chair placed behind, RW in front, BB at 3rd hole down.  1x7 1x6 1x6  1x6  1x5 1x5 Seated rests between sets (suggest 1 min)  Therapeutic exercise: therapeutic exercises that incorporate ONE parameter at one or more areas of the body to centralize symptoms, develop strength and endurance, range of motion, and flexibility required for successful completion of functional activities.  LE strength testing (see above)  Pt required multimodal cuing for proper technique and to facilitate improved neuromuscular control, strength, range of motion, and functional ability resulting in improved performance and form.  PATIENT EDUCATION:  Education details: Exercise purpose/form. Self management techniques. POC.  Person educated: patient Education method: Explanation Education comprehension: verbalized understanding and needs further education  HOME EXERCISE PROGRAM: Verbally:  - seated lumbar flexion stretch with head down and one LE extended using rollator, 1x10 each side.  - curl up with legs elevated, 5x15 seconds - sidelying open book 1x10 each side  Access Code: [email or web address] URL: [email or web address] Date: 12/18/2023 Prepared by: Camie Cleverly  Exercises - Seated Diaphragmatic Breathing  - 3 reps - 1 minute practice time -  Seated Pelvic Floor Elevators  - 3 x daily - 2 sets - 10 reps - Seated Ankle Plantar Flexion with Resistance Loop  - 1 x daily - 3 sets - 20 reps - Seated Ankle Dorsiflexion AROM  -  1 x daily - 3 sets - 10-30 reps - Seated Ankle Inversion AROM  - 1 x daily - 3 sets - 10-30 reps - Seated Ankle Eversion AROM  - 1 x daily - 3 sets - 10-30 reps - Sidelying Ankle Eversion Strengthening with Ankle Weight  - 1 x daily - 3 sets - 20 reps - Sidelying Ankle Inversion with Ankle Weight  - 1 x daily - 3 sets - 20 reps - Seated Ankle Dorsiflexion with Ankle Weight  - 1 x daily - 3 sets - 20 reps  ASSESSMENT:  CLINICAL IMPRESSION:     Patient has attended 42 physical therapy sessions since starting current episode of care on 03/12/2023. Since last PT progress note, patient has had more success training LE with squats using bar overhead in squat cage to help provide slight lumbar traction and offload B LE so he can continue exercises. He also has been able to improve his hip extension using modified quadruped position with knees on weight bench and chest over plinth. He continues to be limited by neurologic weakness and pain that limits his activity tolerance. He continues to require PT assistance to complete exercises safely and effectively and continued PT is medically necessary in the setting of chronic degenerative condition where lack of PT will cause regression of functional independence. Patient did have some improvement in right ankle strength since last progress note. Patient would benefit from continued management of limiting condition by skilled physical therapist to address remaining impairments and functional limitations to work towards stated goals and return to PLOF or maximal functional independence.     OBJECTIVE IMPAIRMENTS: Abnormal gait, decreased activity tolerance, decreased balance, decreased coordination, decreased endurance, decreased knowledge of condition, decreased knowledge of use of DME,  decreased mobility, difficulty walking, decreased ROM, decreased strength, increased edema, impaired perceived functional ability, increased muscle spasms, impaired flexibility, impaired sensation, impaired tone, improper body mechanics, postural dysfunction, obesity, and pain.   ACTIVITY LIMITATIONS: carrying, lifting, bending, standing, squatting, stairs, transfers, bed mobility, continence, bathing, toileting, dressing, hygiene/grooming, locomotion level, and caring for others  PARTICIPATION LIMITATIONS: meal prep, cleaning, laundry, interpersonal relationship, driving, shopping, community activity, occupation, yard work, and  difficulty with or unable to complete any activity that requires weight bearing, use of B LE, and/or balance including working, household and community mobility, walking, driving, going to family gatherings, avoiding incontinent episodes, playing with daughter, helping around the house, bed mobility, transfers  PERSONAL FACTORS: Past/current experiences, Time since onset of injury/illness/exacerbation, and 3+ comorbidities:  3 thoracic spine surgeries, thoracic MRI notes 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 Gillie on 05/25/2022, adhesive arachnoiditis, nerve pain are also affecting patient's functional outcome.   REHAB POTENTIAL: Fair due to severity and nature of condition.   CLINICAL DECISION MAKING: Evolving/moderate complexity  EVALUATION COMPLEXITY: Moderate   GOALS: Goals reviewed with patient? No  SHORT TERM GOALS: Target date: 01/09/2023. Target date updated to 05/28/2023 for all unmet goals on 03/05/2023.   Patient will be independent with  initial home exercise program for self-management of symptoms. Baseline: Initial HEP to be provided at visit 2 as appropriate (12/26/22); Goal status: MET  LONG TERM GOALS: Target date: 03/20/2023. Target date updated to 08/26/23  for  all unmet goals on 06/03/2023. Target date updated to 11/20/2023 for all  unmet goals on 08/28/2023. Target date updated to 02/17/2024  for all unmet goals on 11/25/2023. Target date updated to 04/14/2024 for all unmet goals on 01/22/2024. Updated to 07/13/2024 for all unmet goals on 04/20/2024.   Patient will be independent with a long-term home exercise program for self-management of symptoms.  Baseline: Initial HEP to be provided at visit 2 as appropriate (12/26/22); participating as able (01/31/2023); patient currently participating as tolerated (03/05/2023; 04/09/2023; 06/03/2023); participates daily in mat exercises and in weight bearing exercises as tolerated (10/08/2023; 11/25/2023); Tries to do daily but sometimes cannot when his body won't let him (01/22/2024); participating as able including activities he finds way to mimic at home that he learns in clinic (03/17/2024); continues to complete exercises as tolerated (04/20/2024);  Goal status: In-progress  2.  Patient will demonstrate improved FOTO by equal or greater than 10 points by visit #13 to demonstrate improvement in overall condition and self-reported functional ability.  Baseline: to be tested visit 2 as appropriate (12/26/22); 30 at visit #3 (01/02/2023); 46 at visit #13 (03/05/2023); 45 at visit #29 (06/03/2023); 49 at visit #50 (10/08/2023);  Goal status: MET  3.  Patient will demonstrate the ability to ambulate equal or greater than 600 feet with LRAD during the 6 minute walk to improve his household and community mobility.  Baseline: 34 feet with bari-RW (12/26/22); 200 feet with bari-RW (01/31/2023); 500 feet with BRW (03/05/2023); 400 feet with BRW (04/09/2023); 152 feet with BRW and W/C follow for safety (06/03/2023); 330  feet with BRW (10/08/2023); 245 feet with BRW and SBA for safety (11/25/2023); 343 feet with BRW and SBA for safety (01/22/2024); 340 feet with BRW and SBA for safety (04/20/2024);  Goal status: In progress  4.  Patient will complete 5 Time Sit To Stand Test from 19.5 inch surface or lower in equal or less than 15 seconds with no UE support to demonstrate improved transfer ability for improved household and toileting mobility.  Baseline: 23 seconds with heavy B UE support on RW from 19.5 inch plinth. Pain throbbing down the right LE.  (12/26/22); 19 seconds with heavy B UE support on RW from 19.5 inch plinth (03/05/2023); 20 seconds with heavy B UE support on RRW from 18.5 inch plinth. Painful in back of B calves, thighs, and lower back (04/09/2023); 15 seconds with heavy B UE support on BRW from 18.5 inch plinth. Painful in back of B calves, thighs, and lower back (06/03/2023); 19 seconds with heavy B UE support on BRW from 18.5 inch plinth. Painful in back of B calves, thighs, and lower back (10/08/2023); 18 seconds with heavy B UE support on BRW from 18.5 inch plinth. Painful in back of B calves, thighs, and lower back (11/25/2023); 15seconds with heavy B UE support on BRW from 18.5 inch plinth. Painful in back of B calves, thighs, and lower back (01/22/2024);  14 seconds with heavy B UE support on BRW from 18.5 inch plinth. Painful in back of B calves, thighs, and lower back (04/20/2024);  Goal status: Progressing   5.  Patient will report worst pain equal or less than 3/10 with functional activities to improve his ability to complete basic household and community mobility. Baseline: up to 8/10 (12/26/22); reports 3/10 pain (01/31/2023); reports 4/10 pain (02/19/2023); up to 7/10 in the last 2 weeks (03/05/2023): up to  7/10 in the last two weeks (04/09/2023); up to 8/10 in the last  2 weeks (06/03/2023);  up to 9/10 over the last 2 weeks (10/08/2023); up to 7-8/10 in the last 2 weeks (11/25/2023); 7/10 in the last 2 weeks  (01/22/2024); better controlled in last 2 weeks but no specific number obtained (03/18/2024);  Goal status: Ongoing  6.  Patient will demonstrate improvement in Patient Specific Functional Scale (PSFS) by equal or greater than 3/10 points to reflect clinically significant improvement in patient's most valued functional activities. Baseline: 1.7/10 for Walking Around [address], Enjoying Life, and Driving (97/74/74); 7.2/89 (11/25/2023); 2.7/10 (01/22/24); 2.7/10 (04/20/2024);  Goal status: ongoing  PLAN:  PT FREQUENCY: 1-2x/week PT DURATION: 12 weeks PLANNED INTERVENTIONS: Therapeutic exercises, Therapeutic activity, Neuromuscular re-education, Balance training, Gait training, Patient/Family education, Self Care, Joint mobilization, Stair training, Orthotic/Fit training, DME instructions, Aquatic Therapy, Dry Needling, Electrical stimulation, Wheelchair mobility training, Spinal mobilization, Cryotherapy, Moist heat, Manual therapy, and Re-evaluation.  PLAN FOR NEXT SESSION:  update HEP as appropriate, neurodynamics, gait training, LE/core/functional strengthening, stretching, and balance as tolerated, education, manual therapy as needed. Pelvic floor.   Camie SAUNDERS. Juli, PT, DPT, Cert. MDT 04/20/24, 7:39 PM  [facility] [address] [place], [place] [address] P: [phone] I F: [phone]

## 2024-04-21 ENCOUNTER — Encounter: Attending: Physical Medicine & Rehabilitation | Admitting: Physical Medicine & Rehabilitation

## 2024-04-22 ENCOUNTER — Encounter: Payer: Self-pay | Admitting: Physical Therapy

## 2024-04-22 ENCOUNTER — Ambulatory Visit: Admitting: Physical Therapy

## 2024-04-22 ENCOUNTER — Ambulatory Visit: Admitting: Physical Medicine & Rehabilitation

## 2024-04-22 DIAGNOSIS — R296 Repeated falls: Secondary | ICD-10-CM

## 2024-04-22 DIAGNOSIS — M6281 Muscle weakness (generalized): Secondary | ICD-10-CM

## 2024-04-22 DIAGNOSIS — M5416 Radiculopathy, lumbar region: Secondary | ICD-10-CM

## 2024-04-22 DIAGNOSIS — R262 Difficulty in walking, not elsewhere classified: Secondary | ICD-10-CM

## 2024-04-22 DIAGNOSIS — M5459 Other low back pain: Secondary | ICD-10-CM | POA: Diagnosis not present

## 2024-04-22 DIAGNOSIS — R29818 Other symptoms and signs involving the nervous system: Secondary | ICD-10-CM

## 2024-04-22 NOTE — Therapy (Signed)
 OUTPATIENT PHYSICAL THERAPY TREATMENT  Patient Name: Travis Fitzpatrick MRN: [id number] DOB:[birth year], 43 y.o., male Today's Date: 04/22/24   END OF SESSION:  PT End of Session - 04/22/24 1609     Visit Number 85    Number of Visits 108    Date for PT Re-Evaluation 07/13/24    Authorization Type MEDICARE PART B reporting period from 03/18/24    Progress Note Due on Visit 80    PT Start Time 1605    PT Stop Time 1643    PT Time Calculation (min) 38 min    Activity Tolerance Patient limited by pain;Patient tolerated treatment well    Behavior During Therapy [facility] for tasks assessed/performed             Past Medical History:  Diagnosis Date   Bowel trouble    urgency   Medical history non-contributory    Urinary urgency    Past Surgical History:  Procedure Laterality Date   BACK SURGERY  2010   CIRCUMCISION     LUMBAR LAMINECTOMY/DECOMPRESSION MICRODISCECTOMY  07/20/2011   Procedure: LUMBAR LAMINECTOMY/DECOMPRESSION MICRODISCECTOMY;  Surgeon: Rockey LITTIE Peru;  Location: [facility];  Service: Neurosurgery;  Laterality: N/A;  right thoracotomy with thoracic eight-nine discectomy and fusion   LUMBAR LAMINECTOMY/DECOMPRESSION MICRODISCECTOMY Right 10/06/2021   Procedure: Right Lumbar Four-Five Microdiscectomy, Right Lumbar Five-Sacal One Foraminotomy;  Surgeon: Peru Rockey, MD;  Location: [facility];  Service: Neurosurgery;  Laterality: Right;  3C/RM 21   THORACIC DISCECTOMY  07/16/2012   Procedure: THORACIC DISCECTOMY;  Surgeon: Rockey LITTIE Peru, MD;  Location: [facility];  Service: Neurosurgery;  Laterality: Right;  RIGHT Thoracic seven-eight  thoracic diskectomy via thoracotomy by dr lucas   THORACIC DISCECTOMY N/A 12/15/2014   Procedure: THORACIC SEVEN TO THORACIC NINE Laminectomy ;  Surgeon: Rockey Peru, MD;  Location: [facility];  Service: Neurosurgery;  Laterality: N/A;   THORACIC DISCECTOMY N/A 06/06/2017   Procedure: LAMINECTOMY THORACIC NINE-TEN;  Surgeon: Peru Rockey,  MD;  Location: [facility];  Service: Neurosurgery;  Laterality: N/A;  LAMINECTOMY THORACIC NINE-TEN   THORACOTOMY  07/20/2011   Procedure: THORACOTOMY OPEN FOR SPINE SURGERY;  Surgeon: JONETTA Belvie Nam, MD;  Location: [facility];  Service: Vascular;  Laterality: N/A;   THORACOTOMY  07/16/2012   Procedure: THORACOTOMY OPEN FOR SPINE SURGERY;  Surgeon: Dorise MARLA lucas, MD;  Location: [facility];  Service: Thoracic;  Laterality: N/A;   Patient Active Problem List   Diagnosis Date Noted   Carpal tunnel syndrome on right 02/19/2024   Spasticity 09/25/2023   Nerve pain 12/05/2022   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   S/P lumbar spinal fusion 05/25/2022   Synovial cyst of lumbar facet joint 05/25/2022   HNP (herniated nucleus pulposus), lumbar 10/06/2021   Hand pain 06/28/2021   Lumbar facet arthropathy 01/11/2021   Thoracic spondylosis with myelopathy 01/11/2021   Thoracic spinal stenosis 06/06/2017   Stenosis, spinal, thoracic 12/15/2014   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  ONSET DATE:  Suddenly started having weakness 11 years ago, most recent episode of worsening October 2022, s/p right L4-5 laminotomy, microdiscectomy on 10/06/2021, now s/p L4-5 PLIF by Dr. Peru on 05/25/2022.  PERTINENT HISTORY:  Patient  is a 44 y.o. male who presents to outpatient physical therapy with a referral for medical diagnosis thoracic disc disease with myelopathy, nerve pain, adhesive arachnoiditis. This patient's chief complaints consist of disabling low back and R >L leg pain and weakness with activity, bowel and bladder urgency, incontinence, and retention,  leading to the following functional deficits: difficulty with or unable to complete any activity that requires weight bearing, use of B LE, and/or balance including working, household and community mobility, walking, driving, going to family gatherings, avoiding incontinent episodes, playing with daughter, helping around the house, bed mobility, transfers. 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 Gillie on 05/25/2022, adhesive arachnoiditis, nerve pain.  Patient denies hx of cancer, stroke, seizures, lung problems, heart problems, diabetes, unexplained weight loss, and osteoporosis.  SUBJECTIVE:                                                                                                                                                                                           SUBJECTIVE STATEMENT:     Patient states he was sore but not much more pain after last PT session. He was able to get out of the bed. He had soreness in his quads and other muscles.   PAIN:  NPRS:  2/10 mostly in low back, right groin/hip, bilateral posterior thighs, right calf.   PRECAUTIONS: Fall  PATIENT GOALS: To get better, walk, drive again, climb stairs, play with my daughter, help around the house again  OBJECTIVE  TREATMENT  Therapeutic activities: dynamic therapeutic activities incorporating MULTIPLE parameters or areas of the body designed to achieve improved functional performance.  UE support squats at squat cage (holding on to BB to support bodyweight and offload lumbar spine and LE).  Chair placed behind, RW in front, BB at 3rd hole down.  1x5 1x7 1x4 1x5 1x6 1x6 Seated rests of approx 1 min between sets  Perching (glutes propped on elevated plinth with feet on floor, knees below hips) hip flexion with ankle DF with 2#AW on top of each foot.  3x5 each side RW in front of body  UE support on RW progressing to plinth To improve ability to move legs on/off pedals while driving.  Therapeutic exercise: therapeutic exercises that incorporate ONE parameter at one or more areas  of the body to centralize symptoms, develop strength and endurance, range of motion, and flexibility required for successful completion of functional activities.  Seated quad extension on OMEGA machine, seat position 3 B LE A few reps at various weights to assess for what weight to use (20-45#) 6x5 at 25#  Pt required multimodal cuing for proper technique and to facilitate improved neuromuscular control, strength, range of motion, and functional ability resulting in improved performance and form.  PATIENT EDUCATION:  Education details: Exercise purpose/form. Self management techniques. POC.  Person educated: patient Education method: Explanation Education comprehension: verbalized understanding and needs further education  HOME EXERCISE PROGRAM: Verbally:  - seated lumbar flexion stretch with head down and one LE extended using rollator, 1x10 each side.  - curl up with legs elevated, 5x15 seconds - sidelying open book 1x10 each side  Access Code: [email or web address] URL: [email or web address] Date: 12/18/2023 Prepared by: Camie Cleverly  Exercises - Seated Diaphragmatic Breathing  - 3 reps - 1 minute practice time - Seated Pelvic Floor Elevators  - 3 x daily - 2 sets - 10 reps - Seated Ankle Plantar Flexion with Resistance Loop  - 1 x daily - 3 sets - 20 reps - Seated Ankle Dorsiflexion AROM  - 1 x daily - 3 sets - 10-30 reps - Seated Ankle Inversion AROM  - 1 x daily - 3 sets - 10-30  reps - Seated Ankle Eversion AROM  - 1 x daily - 3 sets - 10-30 reps - Sidelying Ankle Eversion Strengthening with Ankle Weight  - 1 x daily - 3 sets - 20 reps - Sidelying Ankle Inversion with Ankle Weight  - 1 x daily - 3 sets - 20 reps - Seated Ankle Dorsiflexion with Ankle Weight  - 1 x daily - 3 sets - 20 reps  ASSESSMENT:  CLINICAL IMPRESSION:     Patient has attended 51 physical therapy sessions since starting current episode of care on 03/12/2023. Since last PT progress note, patient has had more success training LE with squats using bar overhead in squat cage to help provide slight lumbar traction and offload B LE so he can continue exercises. He also has been able to improve his hip extension using modified quadruped position with knees on weight bench and chest over plinth. He continues to be limited by neurologic weakness and pain that limits his activity tolerance. He continues to require PT assistance to complete exercises safely and effectively and continued PT is medically necessary in the setting of chronic degenerative condition where lack of PT will cause regression of functional independence. Patient did have some improvement in right ankle strength since last progress note. Patient would benefit from continued management of limiting condition by skilled physical therapist to address remaining impairments and functional limitations to work towards stated goals and return to PLOF or maximal functional independence.     OBJECTIVE IMPAIRMENTS: Abnormal gait, decreased activity tolerance, decreased balance, decreased coordination, decreased endurance, decreased knowledge of condition, decreased knowledge of use of DME, decreased mobility, difficulty walking, decreased ROM, decreased strength, increased edema, impaired perceived functional ability, increased muscle spasms, impaired flexibility, impaired sensation, impaired tone, improper body mechanics, postural dysfunction, obesity, and  pain.   ACTIVITY LIMITATIONS: carrying, lifting, bending, standing, squatting, stairs, transfers, bed mobility, continence, bathing, toileting, dressing, hygiene/grooming, locomotion level, and caring for others  PARTICIPATION LIMITATIONS: meal prep, cleaning, laundry, interpersonal relationship, driving, shopping, community activity, occupation, yard work, and  difficulty with or unable to complete any activity that requires weight  bearing, use of B LE, and/or balance including working, household and community mobility, walking, driving, going to family gatherings, avoiding incontinent episodes, playing with daughter, helping around the house, bed mobility, transfers  PERSONAL FACTORS: Past/current experiences, Time since onset of injury/illness/exacerbation, and 3+ comorbidities:  3 thoracic spine surgeries, thoracic MRI notes 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 Gillie on 05/25/2022, adhesive arachnoiditis, nerve pain are also affecting patient's functional outcome.   REHAB POTENTIAL: Fair due to severity and nature of condition.   CLINICAL DECISION MAKING: Evolving/moderate complexity  EVALUATION COMPLEXITY: Moderate   GOALS: Goals reviewed with patient? No  SHORT TERM GOALS: Target date: 01/09/2023. Target date updated to 05/28/2023 for all unmet goals on 03/05/2023.   Patient will be independent with initial home exercise program for self-management of symptoms. Baseline: Initial HEP to be provided at visit 2 as appropriate (12/26/22); Goal status: MET  LONG TERM GOALS: Target date: 03/20/2023. Target date updated to 08/26/23  for all unmet goals on 06/03/2023. Target  date updated to 11/20/2023 for all  unmet goals on 08/28/2023. Target date updated to 02/17/2024  for all unmet goals on 11/25/2023. Target date updated to 04/14/2024 for all unmet goals on 01/22/2024. Updated to 07/13/2024 for all unmet goals on 04/20/2024.   Patient will be independent with a long-term home exercise program for self-management of symptoms.  Baseline: Initial HEP to be provided at visit 2 as appropriate (12/26/22); participating as able (01/31/2023); patient currently participating as tolerated (03/05/2023; 04/09/2023; 06/03/2023); participates daily in mat exercises and in weight bearing exercises as tolerated (10/08/2023; 11/25/2023); Tries to do daily but sometimes cannot when his body won't let him (01/22/2024); participating as able including activities he finds way to mimic at home that he learns in clinic (03/17/2024); continues to complete exercises as tolerated (04/20/2024);  Goal status: In-progress  2.  Patient will demonstrate improved FOTO by equal or greater than 10 points by visit #13 to demonstrate improvement in overall condition and self-reported functional ability.  Baseline: to be tested visit 2 as appropriate (12/26/22); 30 at visit #3 (01/02/2023); 46 at visit #13 (03/05/2023); 45 at visit #29 (06/03/2023); 49 at visit #50 (10/08/2023);  Goal status: MET  3.  Patient will demonstrate the ability to ambulate equal or greater than 600 feet with LRAD during the 6 minute walk to improve his household and community mobility.  Baseline: 34 feet with bari-RW (12/26/22); 200 feet with bari-RW (01/31/2023); 500 feet with BRW (03/05/2023); 400 feet with BRW (04/09/2023); 152 feet with BRW and W/C follow for safety (06/03/2023); 330 feet with BRW (10/08/2023); 245 feet with BRW and SBA for safety (11/25/2023); 343 feet with BRW and SBA for safety (01/22/2024); 340 feet with BRW and SBA for safety (04/20/2024);  Goal status: In progress  4.  Patient will complete 5 Time Sit To Stand Test from 19.5 inch  surface or lower in equal or less than 15 seconds with no UE support to demonstrate improved transfer ability for improved household and toileting mobility.  Baseline: 23 seconds with heavy B UE support on RW from 19.5 inch plinth. Pain throbbing down the right LE.  (12/26/22); 19 seconds with heavy B UE support on RW from 19.5 inch plinth (03/05/2023);  20 seconds with heavy B UE support on RRW from 18.5 inch plinth. Painful in back of B calves, thighs, and lower back (04/09/2023); 15 seconds with heavy B UE support on BRW from 18.5 inch plinth. Painful in back of B calves, thighs, and lower back (06/03/2023); 19 seconds with heavy B UE support on BRW from 18.5 inch plinth. Painful in back of B calves, thighs, and lower back (10/08/2023); 18 seconds with heavy B UE support on BRW from 18.5 inch plinth. Painful in back of B calves, thighs, and lower back (11/25/2023); 15seconds with heavy B UE support on BRW from 18.5 inch plinth. Painful in back of B calves, thighs, and lower back (01/22/2024);  14 seconds with heavy B UE support on BRW from 18.5 inch plinth. Painful in back of B calves, thighs, and lower back (04/20/2024);  Goal status: Progressing   5.  Patient will report worst pain equal or less than 3/10 with functional activities to improve his ability to complete basic household and community mobility. Baseline: up to 8/10 (12/26/22); reports 3/10 pain (01/31/2023); reports 4/10 pain (02/19/2023); up to 7/10 in the last 2 weeks (03/05/2023): up to  7/10 in the last two weeks (04/09/2023); up to 8/10 in the last 2 weeks (06/03/2023);  up to 9/10 over the last 2 weeks (10/08/2023); up to 7-8/10 in the last 2 weeks (11/25/2023); 7/10 in the last 2 weeks (01/22/2024); better controlled in last 2 weeks but no specific number obtained (03/18/2024);  Goal status: Ongoing  6.  Patient will demonstrate improvement in Patient Specific Functional Scale (PSFS) by equal or greater than 3/10 points to reflect clinically significant  improvement in patient's most valued functional activities. Baseline: 1.7/10 for Walking Around [address], Enjoying Life, and Driving (97/74/74); 7.2/89 (11/25/2023); 2.7/10 (01/22/24); 2.7/10 (04/20/2024);  Goal status: ongoing  PLAN:  PT FREQUENCY: 1-2x/week PT DURATION: 12 weeks PLANNED INTERVENTIONS: Therapeutic exercises, Therapeutic activity, Neuromuscular re-education, Balance training, Gait training, Patient/Family education, Self Care, Joint mobilization, Stair training, Orthotic/Fit training, DME instructions, Aquatic Therapy, Dry Needling, Electrical stimulation, Wheelchair mobility training, Spinal mobilization, Cryotherapy, Moist heat, Manual therapy, and Re-evaluation.  PLAN FOR NEXT SESSION:  update HEP as appropriate, neurodynamics, gait training, LE/core/functional strengthening, stretching, and balance as tolerated, education, manual therapy as needed. Pelvic floor.   Camie SAUNDERS. Juli, PT, DPT, Cert. MDT 04/22/24, 4:12 PM  [facility] [address] [place], [place] [address] P: [phone] I F: [phone]

## 2024-04-28 ENCOUNTER — Encounter: Payer: Self-pay | Admitting: Physical Therapy

## 2024-04-28 ENCOUNTER — Ambulatory Visit: Admitting: Physical Therapy

## 2024-04-28 DIAGNOSIS — M5459 Other low back pain: Secondary | ICD-10-CM

## 2024-04-28 DIAGNOSIS — R29818 Other symptoms and signs involving the nervous system: Secondary | ICD-10-CM

## 2024-04-28 DIAGNOSIS — M6281 Muscle weakness (generalized): Secondary | ICD-10-CM

## 2024-04-28 DIAGNOSIS — M5416 Radiculopathy, lumbar region: Secondary | ICD-10-CM

## 2024-04-28 DIAGNOSIS — R262 Difficulty in walking, not elsewhere classified: Secondary | ICD-10-CM

## 2024-04-28 DIAGNOSIS — R296 Repeated falls: Secondary | ICD-10-CM

## 2024-04-28 NOTE — Therapy (Signed)
 OUTPATIENT PHYSICAL THERAPY TREATMENT  Patient Name: Travis Fitzpatrick MRN: [id number] DOB:February 18, 1981, 43 y.o., male Today's Date: 04/28/24   END OF SESSION:  PT End of Session - 04/28/24 1700     Visit Number 86    Number of Visits 108    Date for PT Re-Evaluation 07/13/24    Authorization Type MEDICARE PART B reporting period from 03/18/2024    Progress Note Due on Visit 80    PT Start Time 1653    PT Stop Time 1731    PT Time Calculation (min) 38 min    Activity Tolerance Patient limited by pain;Patient tolerated treatment well    Behavior During Therapy [facility] for tasks assessed/performed              Past Medical History:  Diagnosis Date   Bowel trouble    urgency   Medical history non-contributory    Urinary urgency    Past Surgical History:  Procedure Laterality Date   BACK SURGERY  2010   CIRCUMCISION     LUMBAR LAMINECTOMY/DECOMPRESSION MICRODISCECTOMY  07/20/2011   Procedure: LUMBAR LAMINECTOMY/DECOMPRESSION MICRODISCECTOMY;  Surgeon: Rockey LITTIE Peru;  Location: [facility];  Service: Neurosurgery;  Laterality: N/A;  right thoracotomy with thoracic eight-nine discectomy and fusion   LUMBAR LAMINECTOMY/DECOMPRESSION MICRODISCECTOMY Right 10/06/2021   Procedure: Right Lumbar Four-Five Microdiscectomy, Right Lumbar Five-Sacal One Foraminotomy;  Surgeon: Peru Rockey, MD;  Location: [facility];  Service: Neurosurgery;  Laterality: Right;  3C/RM 21   THORACIC DISCECTOMY  07/16/2012   Procedure: THORACIC DISCECTOMY;  Surgeon: Rockey LITTIE Peru, MD;  Location: [facility];  Service: Neurosurgery;  Laterality: Right;  RIGHT Thoracic seven-eight  thoracic diskectomy via thoracotomy by dr lucas   THORACIC DISCECTOMY N/A 12/15/2014   Procedure: THORACIC SEVEN TO THORACIC NINE Laminectomy ;  Surgeon: Rockey Peru, MD;  Location: [facility];  Service: Neurosurgery;  Laterality: N/A;   THORACIC DISCECTOMY N/A 06/06/2017   Procedure: LAMINECTOMY THORACIC NINE-TEN;  Surgeon: [place] Rockey, MD;  Location: [facility];  Service: Neurosurgery;  Laterality: N/A;  LAMINECTOMY THORACIC NINE-TEN   THORACOTOMY  07/20/2011   Procedure: THORACOTOMY OPEN FOR SPINE SURGERY;  Surgeon: JONETTA Belvie Nam, MD;  Location: [facility];  Service: Vascular;  Laterality: N/A;   THORACOTOMY  07/16/2012   Procedure: THORACOTOMY OPEN FOR SPINE SURGERY;  Surgeon: Dorise MARLA lucas, MD;  Location: [facility];  Service: Thoracic;  Laterality: N/A;   Patient Active Problem List   Diagnosis Date Noted   Carpal tunnel syndrome on right 02/19/2024   Spasticity 09/25/2023   Nerve pain 12/05/2022   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   S/P lumbar spinal fusion 05/25/2022   Synovial cyst of lumbar facet joint 05/25/2022   HNP (herniated nucleus pulposus), lumbar 10/06/2021   Hand pain 06/28/2021   Lumbar facet arthropathy 01/11/2021   Thoracic spondylosis with myelopathy 01/11/2021   Thoracic spinal stenosis 06/06/2017   Stenosis, spinal, thoracic 12/15/2014   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 09/22/2014   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/2011    PCP: Maryl Sayres  REFERRING PROVIDER: Babs Arthea DASEN, MD  REFERRING DIAG: thoracic disc disease with myelopathy, nerve pain, adhesive arachnoiditis  Rationale for Evaluation and Treatment: Rehabilitation  THERAPY DIAG:  Other low back pain  Other symptoms and signs involving the nervous system  Radiculopathy, lumbar region  Muscle weakness (generalized)  Repeated falls  Difficulty in walking, not elsewhere classified  ONSET DATE:  Suddenly started having weakness 11 years  ago, most recent episode of worsening October 2022, s/p right L4-5 laminotomy, microdiscectomy on 10/06/2021, now s/p L4-5 PLIF by Dr. Gillie on 05/25/2022.  PERTINENT HISTORY:  Patient is a 43 y.o. male who presents to outpatient physical therapy with a referral for medical diagnosis thoracic disc disease with myelopathy, nerve  pain, adhesive arachnoiditis. This patient's chief complaints consist of disabling low back and R >L leg pain and weakness with activity, bowel and bladder urgency, incontinence, and retention, leading to the following functional deficits: difficulty with or unable to complete any activity that requires weight bearing, use of B LE, and/or balance including working, household and community mobility, walking, driving, going to family gatherings, avoiding incontinent episodes, playing with daughter, helping around the house, bed mobility, transfers. 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 Gillie on 05/25/2022, adhesive arachnoiditis, nerve pain.  Patient denies hx of cancer, stroke, seizures, lung problems, heart problems, diabetes, unexplained weight loss, and osteoporosis.  SUBJECTIVE:                                                                                                                                                                                           SUBJECTIVE STATEMENT:     Patient states he is doing okay today. He states he was sore after last PT session. He had increased pain and soreness in his muscles but was able to get out of bed after last PT session.   PAIN:  NPRS:  2/10 mostly in low back, right groin/hip, bilateral posterior thighs, right calf.   PRECAUTIONS: Fall  PATIENT GOALS: To get better, walk, drive again, climb stairs, play with my daughter, help around the house again  OBJECTIVE  TREATMENT  Therapeutic activities: dynamic therapeutic  activities incorporating MULTIPLE parameters or areas of the body designed to achieve improved functional performance.  UE support squats at squat cage (holding on to BB to support bodyweight and offload lumbar spine and LE). Chair placed behind, RW in front, BB at 3rd hole down.  1x7 1x6 1x6 1x6 1x6 1x6 Seated rests of approx 1 min between sets  Perching (glutes propped on elevated plinth with feet on floor, knees below hips) hip flexion with ankle DF with 2#AW on top of each foot.  3x5 each side RW in front  of body  UE support on RW progressing to plinth To improve ability to move legs on/off pedals while driving.  Attempted standing heel raises with UE offloading Unable while holding walker Unable while holding bar overhead to help unload Attempted in seated and pt had very little movement  Patient very motivated to complete in standing   Therapeutic exercise: therapeutic exercises that incorporate ONE parameter at one or more areas of the body to centralize symptoms, develop strength and endurance, range of motion, and flexibility required for successful completion of functional activities.  Seated quad extension on OMEGA machine, seat position 3 B LE 6x5 at 25# 1 min rests between sets  Pt required multimodal cuing for proper technique and to facilitate improved neuromuscular control, strength, range of motion, and functional ability resulting in improved performance and form.  PATIENT EDUCATION:  Education details: Exercise purpose/form. Self management techniques. POC.  Person educated: patient Education method: Explanation Education comprehension: verbalized understanding and needs further education  HOME EXERCISE PROGRAM: Verbally:  - seated lumbar flexion stretch with head down and one LE extended using rollator, 1x10 each side.  - curl up with legs elevated, 5x15 seconds - sidelying open book 1x10 each side  Access Code: [email or web address] URL:  [email or web address] Date: 12/18/2023 Prepared by: Camie Cleverly  Exercises - Seated Diaphragmatic Breathing  - 3 reps - 1 minute practice time - Seated Pelvic Floor Elevators  - 3 x daily - 2 sets - 10 reps - Seated Ankle Plantar Flexion with Resistance Loop  - 1 x daily - 3 sets - 20 reps - Seated Ankle Dorsiflexion AROM  - 1 x daily - 3 sets - 10-30 reps - Seated Ankle Inversion AROM  - 1 x daily - 3 sets - 10-30 reps - Seated Ankle Eversion AROM  - 1 x daily - 3 sets - 10-30 reps - Sidelying Ankle Eversion Strengthening with Ankle Weight  - 1 x daily - 3 sets - 20 reps - Sidelying Ankle Inversion with Ankle Weight  - 1 x daily - 3 sets - 20 reps - Seated Ankle Dorsiflexion with Ankle Weight  - 1 x daily - 3 sets - 20 reps  ASSESSMENT:  CLINICAL IMPRESSION:     Continued with LE strengthening for improved functioning, using positions patient can tolerate and using B UE to offload spinal compression some. Patient tolerated session better than last, moving through the exercises at slightly increased speed and was more consistent with his squat reps with increased depth. Attempted heel raises in standing before realizing he is unable to complete them effectively in sitting. Patient tolerated session well overall, but continues to be limited by pain, fatigue, and neurogenic weakness. Patient would benefit from continued management of limiting condition by skilled physical therapist to address remaining impairments and functional limitations to work towards stated goals and return to PLOF or maximal functional independence.    OBJECTIVE IMPAIRMENTS: Abnormal gait, decreased activity tolerance, decreased balance, decreased coordination, decreased endurance, decreased knowledge of condition, decreased knowledge of use of DME, decreased mobility, difficulty walking, decreased ROM, decreased strength, increased edema, impaired perceived functional ability, increased muscle spasms,  impaired flexibility, impaired sensation, impaired tone, improper body mechanics, postural dysfunction, obesity, and pain.   ACTIVITY LIMITATIONS: carrying, lifting, bending, standing, squatting, stairs, transfers, bed mobility, continence, bathing, toileting, dressing, hygiene/grooming, locomotion level, and caring for others  PARTICIPATION LIMITATIONS: meal prep, cleaning, laundry, interpersonal relationship, driving, shopping, community activity, occupation, yard work, and  difficulty with or unable to complete any  activity that requires weight bearing, use of B LE, and/or balance including working, household and community mobility, walking, driving, going to family gatherings, avoiding incontinent episodes, playing with daughter, helping around the house, bed mobility, transfers  PERSONAL FACTORS: Past/current experiences, Time since onset of injury/illness/exacerbation, and 3+ comorbidities:  3 thoracic spine surgeries, thoracic MRI notes 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 Gillie on 05/25/2022, adhesive arachnoiditis, nerve pain are also affecting patient's functional outcome.   REHAB POTENTIAL: Fair due to severity and nature of condition.   CLINICAL DECISION MAKING: Evolving/moderate complexity  EVALUATION COMPLEXITY: Moderate   GOALS: Goals reviewed with patient? No  SHORT TERM GOALS: Target date: 01/09/2023. Target date updated to 05/28/2023 for all unmet goals on 03/05/2023.   Patient will be independent with initial home exercise program for self-management of symptoms. Baseline: Initial HEP to be provided at visit 2 as appropriate (12/26/22); Goal status:  MET  LONG TERM GOALS: Target date: 03/20/2023. Target date updated to 08/26/23  for all unmet goals on 06/03/2023. Target date updated to 11/20/2023 for all  unmet goals on 08/28/2023. Target date updated to 02/17/2024  for all unmet goals on 11/25/2023. Target date updated to 04/14/2024 for all unmet goals on 01/22/2024. Updated to 07/13/2024 for all unmet goals on 04/20/2024.   Patient will be independent with a long-term home exercise program for self-management of symptoms.  Baseline: Initial HEP to be provided at visit 2 as appropriate (12/26/22); participating as able (01/31/2023); patient currently participating as tolerated (03/05/2023; 04/09/2023; 06/03/2023); participates daily in mat exercises and in weight bearing exercises as tolerated (10/08/2023; 11/25/2023); Tries to do daily but sometimes cannot when his body won't let him (01/22/2024); participating as able including activities he finds way to mimic at home that he learns in clinic (03/17/2024); continues to complete exercises as tolerated (04/20/2024);  Goal status: In-progress  2.  Patient will demonstrate improved FOTO by equal or greater than 10 points by visit #13 to demonstrate improvement in overall condition and self-reported functional ability.  Baseline: to be tested visit 2 as appropriate (12/26/22); 30 at visit #3 (01/02/2023); 46 at visit #13 (03/05/2023); 45 at visit #29 (06/03/2023); 49 at visit #50 (10/08/2023);  Goal status: MET  3.  Patient will demonstrate the ability to ambulate equal or greater than 600 feet with LRAD during the 6 minute walk to improve his household and community mobility.  Baseline: 34 feet with bari-RW (12/26/22); 200 feet with bari-RW (01/31/2023); 500 feet with BRW (03/05/2023); 400 feet with BRW (04/09/2023); 152 feet with BRW and W/C follow for safety (06/03/2023); 330 feet with BRW (10/08/2023); 245 feet with BRW and SBA for safety (11/25/2023); 343 feet with BRW and SBA for safety (01/22/2024); 340 feet with BRW and SBA  for safety (04/20/2024);  Goal status: In progress  4.  Patient will complete 5 Time Sit To Stand Test from 19.5 inch surface or lower in equal or less than 15 seconds with no UE support to demonstrate improved transfer ability for improved household and toileting mobility.  Baseline: 23 seconds with heavy B UE support on RW from 19.5 inch plinth. Pain throbbing down the right LE.  (12/26/22); 19 seconds with heavy B UE support on RW from  19.5 inch plinth (03/05/2023); 20 seconds with heavy B UE support on RRW from 18.5 inch plinth. Painful in back of B calves, thighs, and lower back (04/09/2023); 15 seconds with heavy B UE support on BRW from 18.5 inch plinth. Painful in back of B calves, thighs, and lower back (06/03/2023); 19 seconds with heavy B UE support on BRW from 18.5 inch plinth. Painful in back of B calves, thighs, and lower back (10/08/2023); 18 seconds with heavy B UE support on BRW from 18.5 inch plinth. Painful in back of B calves, thighs, and lower back (11/25/2023); 15seconds with heavy B UE support on BRW from 18.5 inch plinth. Painful in back of B calves, thighs, and lower back (01/22/2024);  14 seconds with heavy B UE support on BRW from 18.5 inch plinth. Painful in back of B calves, thighs, and lower back (04/20/2024);  Goal status: Progressing   5.  Patient will report worst pain equal or less than 3/10 with functional activities to improve his ability to complete basic household and community mobility. Baseline: up to 8/10 (12/26/22); reports 3/10 pain (01/31/2023); reports 4/10 pain (02/19/2023); up to 7/10 in the last 2 weeks (03/05/2023): up to  7/10 in the last two weeks (04/09/2023); up to 8/10 in the last 2 weeks (06/03/2023);  up to 9/10 over the last 2 weeks (10/08/2023); up to 7-8/10 in the last 2 weeks (11/25/2023); 7/10 in the last 2 weeks (01/22/2024); better controlled in last 2 weeks but no specific number obtained (03/18/2024);  Goal status: Ongoing  6.  Patient will demonstrate  improvement in Patient Specific Functional Scale (PSFS) by equal or greater than 3/10 points to reflect clinically significant improvement in patient's most valued functional activities. Baseline: 1.7/10 for Walking Around [address], Enjoying Life, and Driving (97/74/74); 7.2/89 (11/25/2023); 2.7/10 (01/22/24); 2.7/10 (04/20/2024);  Goal status: ongoing  PLAN:  PT FREQUENCY: 1-2x/week PT DURATION: 12 weeks PLANNED INTERVENTIONS: Therapeutic exercises, Therapeutic activity, Neuromuscular re-education, Balance training, Gait training, Patient/Family education, Self Care, Joint mobilization, Stair training, Orthotic/Fit training, DME instructions, Aquatic Therapy, Dry Needling, Electrical stimulation, Wheelchair mobility training, Spinal mobilization, Cryotherapy, Moist heat, Manual therapy, and Re-evaluation.  PLAN FOR NEXT SESSION:  update HEP as appropriate, neurodynamics, gait training, LE/core/functional strengthening, stretching, and balance as tolerated, education, manual therapy as needed. Pelvic floor.   Camie SAUNDERS. Juli, PT, DPT, Cert. MDT 04/28/24, 6:48 PM  [facility] [address] [place], [place] [address] P: [phone] I F: [phone]

## 2024-04-30 ENCOUNTER — Ambulatory Visit: Admitting: Physical Therapy

## 2024-05-04 ENCOUNTER — Ambulatory Visit: Admitting: Physical Therapy

## 2024-05-06 ENCOUNTER — Ambulatory Visit: Admitting: Physical Therapy

## 2024-05-11 ENCOUNTER — Ambulatory Visit: Admitting: Physical Therapy

## 2024-05-11 ENCOUNTER — Encounter: Payer: Self-pay | Admitting: Physical Therapy

## 2024-05-11 DIAGNOSIS — M5459 Other low back pain: Secondary | ICD-10-CM | POA: Diagnosis not present

## 2024-05-11 DIAGNOSIS — R262 Difficulty in walking, not elsewhere classified: Secondary | ICD-10-CM

## 2024-05-11 DIAGNOSIS — R296 Repeated falls: Secondary | ICD-10-CM

## 2024-05-11 DIAGNOSIS — R29818 Other symptoms and signs involving the nervous system: Secondary | ICD-10-CM

## 2024-05-11 DIAGNOSIS — M6281 Muscle weakness (generalized): Secondary | ICD-10-CM

## 2024-05-11 DIAGNOSIS — M5416 Radiculopathy, lumbar region: Secondary | ICD-10-CM

## 2024-05-11 NOTE — Therapy (Signed)
 OUTPATIENT PHYSICAL THERAPY TREATMENT  Patient Name: Travis Fitzpatrick MRN: [id number] DOB:[birth year], 43 y.o., male Today's Date: 05/11/24   END OF SESSION:  PT End of Session - 05/11/24 1605     Visit Number 87    Number of Visits 108    Date for Recertification  07/13/24    Authorization Type MEDICARE PART B reporting period from 03/18/2024    Progress Note Due on Visit 80    PT Start Time 1603    PT Stop Time 1641    PT Time Calculation (min) 38 min    Activity Tolerance Patient limited by pain;Patient tolerated treatment well    Behavior During Therapy [facility] for tasks assessed/performed               Past Medical History:  Diagnosis Date   Bowel trouble    urgency   Medical history non-contributory    Urinary urgency    Past Surgical History:  Procedure Laterality Date   BACK SURGERY  2010   CIRCUMCISION     LUMBAR LAMINECTOMY/DECOMPRESSION MICRODISCECTOMY  07/20/2011   Procedure: LUMBAR LAMINECTOMY/DECOMPRESSION MICRODISCECTOMY;  Surgeon: Rockey LITTIE Peru;  Location: [facility];  Service: Neurosurgery;  Laterality: N/A;  right thoracotomy with thoracic eight-nine discectomy and fusion   LUMBAR LAMINECTOMY/DECOMPRESSION MICRODISCECTOMY Right 10/06/2021   Procedure: Right Lumbar Four-Five Microdiscectomy, Right Lumbar Five-Sacal One Foraminotomy;  Surgeon: Peru Rockey, MD;  Location: [facility];  Service: Neurosurgery;  Laterality: Right;  3C/RM 21   THORACIC DISCECTOMY  07/16/2012   Procedure: THORACIC DISCECTOMY;  Surgeon: Rockey LITTIE Peru, MD;  Location: [facility];  Service: Neurosurgery;  Laterality: Right;  RIGHT Thoracic seven-eight  thoracic diskectomy via thoracotomy by dr lucas   THORACIC DISCECTOMY N/A 12/15/2014   Procedure: THORACIC SEVEN TO THORACIC NINE Laminectomy ;  Surgeon: Rockey Peru, MD;  Location: [facility];  Service: Neurosurgery;  Laterality: N/A;   THORACIC DISCECTOMY N/A 06/06/2017   Procedure: LAMINECTOMY THORACIC NINE-TEN;  Surgeon: [place] Rockey, MD;  Location: [facility];  Service: Neurosurgery;  Laterality: N/A;  LAMINECTOMY THORACIC NINE-TEN   THORACOTOMY  07/20/2011   Procedure: THORACOTOMY OPEN FOR SPINE SURGERY;  Surgeon: JONETTA Belvie Nam, MD;  Location: [facility];  Service: Vascular;  Laterality: N/A;   THORACOTOMY  07/16/2012   Procedure: THORACOTOMY OPEN FOR SPINE SURGERY;  Surgeon: Dorise MARLA lucas, MD;  Location: [facility];  Service: Thoracic;  Laterality: N/A;   Patient Active Problem List   Diagnosis Date Noted   Carpal tunnel syndrome on right 02/19/2024   Spasticity 09/25/2023   Nerve pain 12/05/2022   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   S/P lumbar spinal fusion 05/25/2022   Synovial cyst of lumbar facet joint 05/25/2022   HNP (herniated nucleus pulposus), lumbar 10/06/2021   Hand pain 06/28/2021   Lumbar facet arthropathy 01/11/2021   Thoracic spondylosis with myelopathy 01/11/2021   Thoracic spinal stenosis 06/06/2017   Stenosis, spinal, thoracic 12/15/2014   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 09/22/2014   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/2011    PCP: Maryl Sayres  REFERRING PROVIDER: Babs Arthea DASEN, MD  REFERRING DIAG: thoracic disc disease with myelopathy, nerve pain, adhesive arachnoiditis  Rationale for Evaluation and Treatment: Rehabilitation  THERAPY DIAG:  Other low back pain  Other symptoms and signs involving the nervous system  Radiculopathy, lumbar region  Muscle weakness (generalized)  Repeated falls  Difficulty in walking, not elsewhere classified  ONSET DATE:  Suddenly started having weakness 11  years ago, most recent episode of worsening October 2022, s/p right L4-5 laminotomy, microdiscectomy on 10/06/2021, now s/p L4-5 PLIF by Dr. Gillie on 05/25/2022.  PERTINENT HISTORY:  Patient is a 43 y.o. male who presents to outpatient physical therapy with a referral for medical diagnosis thoracic disc disease with myelopathy, nerve  pain, adhesive arachnoiditis. This patient's chief complaints consist of disabling low back and R >L leg pain and weakness with activity, bowel and bladder urgency, incontinence, and retention, leading to the following functional deficits: difficulty with or unable to complete any activity that requires weight bearing, use of B LE, and/or balance including working, household and community mobility, walking, driving, going to family gatherings, avoiding incontinent episodes, playing with daughter, helping around the house, bed mobility, transfers. 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 Gillie on 05/25/2022, adhesive arachnoiditis, nerve pain.  Patient denies hx of cancer, stroke, seizures, lung problems, heart problems, diabetes, unexplained weight loss, and osteoporosis.  SUBJECTIVE:                                                                                                                                                                                           SUBJECTIVE STATEMENT:     Patient states when he got up the day after last PT session he had a lot of pain in his low back and it made his legs not work. He got stuck in the bed. He is feeling better enough to come to PT today, but not to baseline. He would like to do squats. He states he feels the squats and knee extension exercise getting easier.   PAIN:  NPRS:  4/10 mostly in low back, 2/10 right groin/hip, bilateral posterior thighs, right calf.   PRECAUTIONS: Fall  PATIENT GOALS: To get better, walk, drive again,  climb stairs, play with my daughter, help around the house again  OBJECTIVE  TREATMENT  Therapeutic activities: dynamic therapeutic activities incorporating MULTIPLE parameters or areas of the body designed to achieve improved functional performance.  UE support squats at squat cage (holding on to BB to support bodyweight and offload lumbar spine and LE). Chair placed behind, RW in front, BB at 3rd hole down.  1x6 1x6 1x6 1x6 1x6 1x6 Seated rests of approx 1 min between sets  Perching (glutes propped on elevated plinth with feet on floor, knees below hips) hip flexion with ankle DF 1x10 each side 1x5 each side (discontinued due to increased pain at right lower leg) RW in front of body  UE support on RW progressing to plinth  Therapeutic exercise: therapeutic exercises that incorporate ONE parameter at one or more areas of the body to centralize symptoms, develop strength and endurance, range of motion, and flexibility required for successful completion of functional activities.  Seated quad extension on OMEGA machine, seat position 3 B LE 6x6 at 25# 1 min rests between sets  Seated lumbar flexion ball roll out 1x2 min with self-selected pace  Pt required multimodal cuing for proper technique and to facilitate improved neuromuscular control, strength, range of motion, and functional ability resulting in improved performance and form.  PATIENT EDUCATION:  Education details: Exercise purpose/form. Self management techniques. POC.  Person educated: patient Education method: Explanation Education comprehension: verbalized understanding and needs further education  HOME EXERCISE PROGRAM: Verbally:  - seated lumbar flexion stretch with head down and one LE extended using rollator, 1x10 each side.  - curl up with legs elevated, 5x15 seconds - sidelying open book 1x10 each side  Access Code: [email or web address] URL: [email or web address] Date: 12/18/2023 Prepared by: Camie Cleverly  Exercises - Seated Diaphragmatic Breathing  - 3 reps - 1 minute practice time - Seated Pelvic Floor Elevators  - 3 x daily - 2 sets - 10 reps - Seated Ankle Plantar Flexion with Resistance Loop  - 1 x daily - 3 sets - 20 reps - Seated Ankle Dorsiflexion AROM  - 1 x daily - 3 sets - 10-30 reps - Seated Ankle Inversion AROM  - 1 x daily - 3 sets - 10-30 reps - Seated Ankle Eversion AROM  - 1 x daily - 3 sets - 10-30 reps - Sidelying Ankle Eversion Strengthening with Ankle Weight  - 1 x daily - 3 sets - 20 reps - Sidelying Ankle Inversion with Ankle Weight  - 1 x daily - 3 sets - 20 reps - Seated Ankle Dorsiflexion with Ankle Weight  - 1 x daily - 3 sets - 20 reps  ASSESSMENT:  CLINICAL IMPRESSION:     Patient arrives with increased pain in low back that is affecting lower extremity strength. Continued working with focus on LE/functional strengthening as tolerated. He was able to complete similar volume on modified squats with CGA for safety. Had worsening of pulling feeling in shins with knee extension and hip flexion exercises and increased throbbing by end of session. Patient would benefit from continued management of limiting condition by skilled physical therapist to address remaining impairments and functional limitations to work towards stated goals and return to PLOF or maximal functional independence.   OBJECTIVE IMPAIRMENTS: Abnormal gait, decreased activity tolerance, decreased balance, decreased coordination, decreased endurance, decreased knowledge of condition, decreased knowledge of use of DME, decreased mobility, difficulty walking, decreased ROM, decreased strength, increased edema, impaired perceived functional ability, increased muscle spasms, impaired flexibility, impaired sensation, impaired tone, improper body mechanics, postural dysfunction, obesity, and pain.   ACTIVITY LIMITATIONS: carrying, lifting, bending, standing, squatting, stairs, transfers, bed mobility,  continence, bathing, toileting, dressing, hygiene/grooming, locomotion level, and caring for others  PARTICIPATION LIMITATIONS: meal prep, cleaning, laundry, interpersonal relationship, driving, shopping, community activity, occupation, yard work, and  difficulty with or unable to complete any activity that requires weight bearing, use of B LE, and/or balance including working, household and community mobility, walking, driving, going to  family gatherings, avoiding incontinent episodes, playing with daughter, helping around the house, bed mobility, transfers  PERSONAL FACTORS: Past/current experiences, Time since onset of injury/illness/exacerbation, and 3+ comorbidities:  3 thoracic spine surgeries, thoracic MRI notes 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 Gillie on 05/25/2022, adhesive arachnoiditis, nerve pain are also affecting patient's functional outcome.   REHAB POTENTIAL: Fair due to severity and nature of condition.   CLINICAL DECISION MAKING: Evolving/moderate complexity  EVALUATION COMPLEXITY: Moderate   GOALS: Goals reviewed with patient? No  SHORT TERM GOALS: Target date: 01/09/2023. Target date updated to 05/28/2023 for all unmet goals on 03/05/2023.   Patient will be independent with initial home exercise program for self-management of symptoms. Baseline: Initial HEP to be provided at visit 2 as appropriate (12/26/22); Goal status: MET  LONG TERM GOALS: Target date: 03/20/2023. Target date updated to 08/26/23  for all unmet goals on 06/03/2023. Target date updated to 11/20/2023 for all  unmet goals on 08/28/2023. Target date updated to 02/17/2024  for all unmet goals  on 11/25/2023. Target date updated to 04/14/2024 for all unmet goals on 01/22/2024. Updated to 07/13/2024 for all unmet goals on 04/20/2024.   Patient will be independent with a long-term home exercise program for self-management of symptoms.  Baseline: Initial HEP to be provided at visit 2 as appropriate (12/26/22); participating as able (01/31/2023); patient currently participating as tolerated (03/05/2023; 04/09/2023; 06/03/2023); participates daily in mat exercises and in weight bearing exercises as tolerated (10/08/2023; 11/25/2023); Tries to do daily but sometimes cannot when his body won't let him (01/22/2024); participating as able including activities he finds way to mimic at home that he learns in clinic (03/17/2024); continues to complete exercises as tolerated (04/20/2024);  Goal status: In-progress  2.  Patient will demonstrate improved FOTO by equal or greater than 10 points by visit #13 to demonstrate improvement in overall condition and self-reported functional ability.  Baseline: to be tested visit 2 as appropriate (12/26/22); 30 at visit #3 (01/02/2023); 46 at visit #13 (03/05/2023); 45 at visit #29 (06/03/2023); 49 at visit #50 (10/08/2023);  Goal status: MET  3.  Patient will demonstrate the ability to ambulate equal or greater than 600 feet with LRAD during the 6 minute walk to improve his household and community mobility.  Baseline: 34 feet with bari-RW (12/26/22); 200 feet with bari-RW (01/31/2023); 500 feet with BRW (03/05/2023); 400 feet with BRW (04/09/2023); 152 feet with BRW and W/C follow for safety (06/03/2023); 330 feet with BRW (10/08/2023); 245 feet with BRW and SBA for safety (11/25/2023); 343 feet with BRW and SBA for safety (01/22/2024); 340 feet with BRW and SBA for safety (04/20/2024);  Goal status: In progress  4.  Patient will complete 5 Time Sit To Stand Test from 19.5 inch surface or lower in equal or less than 15 seconds with no UE support to demonstrate improved transfer ability for  improved household and toileting mobility.  Baseline: 23 seconds with heavy B UE support on RW from 19.5 inch plinth. Pain throbbing down the right LE.  (12/26/22); 19 seconds with heavy B UE support on RW from 19.5 inch plinth (03/05/2023); 20 seconds with heavy B UE support on RRW from 18.5 inch plinth. Painful in back of  B calves, thighs, and lower back (04/09/2023); 15 seconds with heavy B UE support on BRW from 18.5 inch plinth. Painful in back of B calves, thighs, and lower back (06/03/2023); 19 seconds with heavy B UE support on BRW from 18.5 inch plinth. Painful in back of B calves, thighs, and lower back (10/08/2023); 18 seconds with heavy B UE support on BRW from 18.5 inch plinth. Painful in back of B calves, thighs, and lower back (11/25/2023); 15seconds with heavy B UE support on BRW from 18.5 inch plinth. Painful in back of B calves, thighs, and lower back (01/22/2024);  14 seconds with heavy B UE support on BRW from 18.5 inch plinth. Painful in back of B calves, thighs, and lower back (04/20/2024);  Goal status: Progressing   5.  Patient will report worst pain equal or less than 3/10 with functional activities to improve his ability to complete basic household and community mobility. Baseline: up to 8/10 (12/26/22); reports 3/10 pain (01/31/2023); reports 4/10 pain (02/19/2023); up to 7/10 in the last 2 weeks (03/05/2023): up to  7/10 in the last two weeks (04/09/2023); up to 8/10 in the last 2 weeks (06/03/2023);  up to 9/10 over the last 2 weeks (10/08/2023); up to 7-8/10 in the last 2 weeks (11/25/2023); 7/10 in the last 2 weeks (01/22/2024); better controlled in last 2 weeks but no specific number obtained (03/18/2024);  Goal status: Ongoing  6.  Patient will demonstrate improvement in Patient Specific Functional Scale (PSFS) by equal or greater than 3/10 points to reflect clinically significant improvement in patient's most valued functional activities. Baseline: 1.7/10 for Walking Around [address], Enjoying  Life, and Driving (97/74/74); 7.2/89 (11/25/2023); 2.7/10 (01/22/24); 2.7/10 (04/20/2024);  Goal status: ongoing  PLAN:  PT FREQUENCY: 1-2x/week PT DURATION: 12 weeks PLANNED INTERVENTIONS: Therapeutic exercises, Therapeutic activity, Neuromuscular re-education, Balance training, Gait training, Patient/Family education, Self Care, Joint mobilization, Stair training, Orthotic/Fit training, DME instructions, Aquatic Therapy, Dry Needling, Electrical stimulation, Wheelchair mobility training, Spinal mobilization, Cryotherapy, Moist heat, Manual therapy, and Re-evaluation.  PLAN FOR NEXT SESSION:  update HEP as appropriate, neurodynamics, gait training, LE/core/functional strengthening, stretching, and balance as tolerated, education, manual therapy as needed. Pelvic floor.   Camie SAUNDERS. Juli, PT, DPT, Cert. MDT 05/11/24, 4:41 PM  [facility] [address] [place], [place] [address] P: [phone] I F: [phone]

## 2024-05-13 ENCOUNTER — Ambulatory Visit: Attending: Physical Medicine & Rehabilitation | Admitting: Physical Therapy

## 2024-05-13 ENCOUNTER — Encounter: Payer: Self-pay | Admitting: Physical Therapy

## 2024-05-13 DIAGNOSIS — M5459 Other low back pain: Secondary | ICD-10-CM | POA: Diagnosis present

## 2024-05-13 DIAGNOSIS — R262 Difficulty in walking, not elsewhere classified: Secondary | ICD-10-CM | POA: Insufficient documentation

## 2024-05-13 DIAGNOSIS — M6281 Muscle weakness (generalized): Secondary | ICD-10-CM | POA: Insufficient documentation

## 2024-05-13 DIAGNOSIS — R296 Repeated falls: Secondary | ICD-10-CM | POA: Diagnosis present

## 2024-05-13 DIAGNOSIS — R29818 Other symptoms and signs involving the nervous system: Secondary | ICD-10-CM | POA: Insufficient documentation

## 2024-05-13 DIAGNOSIS — M5416 Radiculopathy, lumbar region: Secondary | ICD-10-CM | POA: Insufficient documentation

## 2024-05-13 NOTE — Therapy (Signed)
 OUTPATIENT PHYSICAL THERAPY TREATMENT  Patient Name: Travis Fitzpatrick MRN: [id number] DOB:[birth year], 43 y.o., male Today's Date: 05/13/24   END OF SESSION:  PT End of Session - 05/13/24 1602     Visit Number 88    Number of Visits 108    Date for Recertification  07/13/24    Authorization Type MEDICARE PART B reporting period from 03/18/2024    Progress Note Due on Visit 80    PT Start Time 1602    PT Stop Time 1642    PT Time Calculation (min) 40 min    Activity Tolerance Patient limited by pain;Patient tolerated treatment well    Behavior During Therapy [facility] for tasks assessed/performed            Past Medical History:  Diagnosis Date   Bowel trouble    urgency   Medical history non-contributory    Urinary urgency    Past Surgical History:  Procedure Laterality Date   BACK SURGERY  2010   CIRCUMCISION     LUMBAR LAMINECTOMY/DECOMPRESSION MICRODISCECTOMY  07/20/2011   Procedure: LUMBAR LAMINECTOMY/DECOMPRESSION MICRODISCECTOMY;  Surgeon: Rockey LITTIE Peru;  Location: [facility];  Service: Neurosurgery;  Laterality: N/A;  right thoracotomy with thoracic eight-nine discectomy and fusion   LUMBAR LAMINECTOMY/DECOMPRESSION MICRODISCECTOMY Right 10/06/2021   Procedure: Right Lumbar Four-Five Microdiscectomy, Right Lumbar Five-Sacal One Foraminotomy;  Surgeon: Peru Rockey, MD;  Location: [facility];  Service: Neurosurgery;  Laterality: Right;  3C/RM 21   THORACIC DISCECTOMY  07/16/2012   Procedure: THORACIC DISCECTOMY;  Surgeon: Rockey LITTIE Peru, MD;  Location: [facility];  Service: Neurosurgery;  Laterality: Right;  RIGHT Thoracic seven-eight  thoracic diskectomy via thoracotomy by dr lucas   THORACIC DISCECTOMY N/A 12/15/2014   Procedure: THORACIC SEVEN TO THORACIC NINE Laminectomy ;  Surgeon: Rockey Peru, MD;  Location: [facility];  Service: Neurosurgery;  Laterality: N/A;   THORACIC DISCECTOMY N/A 06/06/2017   Procedure: LAMINECTOMY THORACIC NINE-TEN;  Surgeon: Peru Rockey,  MD;  Location: [facility];  Service: Neurosurgery;  Laterality: N/A;  LAMINECTOMY THORACIC NINE-TEN   THORACOTOMY  07/20/2011   Procedure: THORACOTOMY OPEN FOR SPINE SURGERY;  Surgeon: JONETTA Belvie Nam, MD;  Location: [facility];  Service: Vascular;  Laterality: N/A;   THORACOTOMY  07/16/2012   Procedure: THORACOTOMY OPEN FOR SPINE SURGERY;  Surgeon: Dorise MARLA lucas, MD;  Location: [facility];  Service: Thoracic;  Laterality: N/A;   Patient Active Problem List   Diagnosis Date Noted   Carpal tunnel syndrome on right 02/19/2024   Spasticity 09/25/2023   Nerve pain 12/05/2022   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   S/P lumbar spinal fusion 05/25/2022   Synovial cyst of lumbar facet joint 05/25/2022   HNP (herniated nucleus pulposus), lumbar 10/06/2021   Hand pain 06/28/2021   Lumbar facet arthropathy 01/11/2021   Thoracic spondylosis with myelopathy 01/11/2021   Thoracic spinal stenosis 06/06/2017   Stenosis, spinal, thoracic 12/15/2014   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 09/22/2014   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/2011    PCP: Maryl Sayres  REFERRING PROVIDER: Babs Arthea DASEN, MD  REFERRING DIAG: thoracic disc disease with myelopathy, nerve pain, adhesive arachnoiditis  Rationale for Evaluation and Treatment: Rehabilitation  THERAPY DIAG:  Other low back pain  Other symptoms and signs involving the nervous system  Radiculopathy, lumbar region  Muscle weakness (generalized)  Repeated falls  Difficulty in walking, not elsewhere classified  ONSET DATE:  Suddenly started having weakness 11 years ago, most  recent episode of worsening October 2022, s/p right L4-5 laminotomy, microdiscectomy on 10/06/2021, now s/p L4-5 PLIF by Dr. Gillie on 05/25/2022.  PERTINENT HISTORY:  Patient is a 44 y.o. male who presents to outpatient physical therapy with a referral for medical diagnosis thoracic disc disease with myelopathy, nerve pain,  adhesive arachnoiditis. This patient's chief complaints consist of disabling low back and R >L leg pain and weakness with activity, bowel and bladder urgency, incontinence, and retention, leading to the following functional deficits: difficulty with or unable to complete any activity that requires weight bearing, use of B LE, and/or balance including working, household and community mobility, walking, driving, going to family gatherings, avoiding incontinent episodes, playing with daughter, helping around the house, bed mobility, transfers. 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 Gillie on 05/25/2022, adhesive arachnoiditis, nerve pain.  Patient denies hx of cancer, stroke, seizures, lung problems, heart problems, diabetes, unexplained weight loss, and osteoporosis.  SUBJECTIVE:                                                                                                                                                                                           SUBJECTIVE STATEMENT:     Patient arrives with BRW today. He states he is doing okay. He states his pain feels like a stiffness and pressure today. He was sore with a little pain (more sore than pain) after last PT session.   PAIN:  NPRS:  3.5/10 mostly in low back, right groin/hip, bilateral posterior thighs, right calf/shin.  PRECAUTIONS: Fall  PATIENT GOALS: To get better, walk, drive again, climb stairs, play with my daughter, help around the house again  OBJECTIVE  TREATMENT  Therapeutic activities: dynamic  therapeutic activities incorporating MULTIPLE parameters or areas of the body designed to achieve improved functional performance.  UE support squats at squat cage (holding on to BB to support bodyweight and offload lumbar spine and LE). Chair placed behind, RW in front, BB at 3rd hole down.  1x6 1x6 1x6 1x6 1x6 1x7 Seated rests of approx 1 min between sets  Perching (glutes propped on elevated plinth with feet on floor, knees below hips) hip flexion with ankle DF with 2# AW draped over top of each foot.  3x10 each side R LE feels like  it is bothered by pressure from edge of plinth at the back of the right glute.   Forwards step up with B UE support, CGA, and chair behind PT for safety 1x5 each side with 6 inch step  Therapeutic exercise: therapeutic exercises that incorporate ONE parameter at one or more areas of the body to centralize symptoms, develop strength and endurance, range of motion, and flexibility required for successful completion of functional activities.  Seated quad extension on OMEGA machine, seat position 3 B LE 6x6 at 25# 1 min rests between sets  Pt required multimodal cuing for proper technique and to facilitate improved neuromuscular control, strength, range of motion, and functional ability resulting in improved performance and form.  PATIENT EDUCATION:  Education details: Exercise purpose/form. Self management techniques. POC.  Person educated: patient Education method: Explanation Education comprehension: verbalized understanding and needs further education  HOME EXERCISE PROGRAM: Verbally:  - seated lumbar flexion stretch with head down and one LE extended using rollator, 1x10 each side.  - curl up with legs elevated, 5x15 seconds - sidelying open book 1x10 each side  Access Code: [email or web address] URL: [email or web address] Date: 12/18/2023 Prepared by: Camie Cleverly  Exercises - Seated Diaphragmatic Breathing  - 3 reps - 1 minute practice  time - Seated Pelvic Floor Elevators  - 3 x daily - 2 sets - 10 reps - Seated Ankle Plantar Flexion with Resistance Loop  - 1 x daily - 3 sets - 20 reps - Seated Ankle Dorsiflexion AROM  - 1 x daily - 3 sets - 10-30 reps - Seated Ankle Inversion AROM  - 1 x daily - 3 sets - 10-30 reps - Seated Ankle Eversion AROM  - 1 x daily - 3 sets - 10-30 reps - Sidelying Ankle Eversion Strengthening with Ankle Weight  - 1 x daily - 3 sets - 20 reps - Sidelying Ankle Inversion with Ankle Weight  - 1 x daily - 3 sets - 20 reps - Seated Ankle Dorsiflexion with Ankle Weight  - 1 x daily - 3 sets - 20 reps  ASSESSMENT:  CLINICAL IMPRESSION:     Continued working on improving LE/functional strength without irritating his symptoms so much that he cannot continue with his daily life. Patient able to squat a little lower and added one rep to his volume today. Also able to return to more difficult marching exercise. He was able to progress to step ups today, but it further irritated his symptoms. Will monitor patient in the future for tolerance. Patient would benefit from continued management of limiting condition by skilled physical therapist to address remaining impairments and functional limitations to work towards stated goals and return to PLOF or maximal functional independence.   OBJECTIVE IMPAIRMENTS: Abnormal gait, decreased activity tolerance, decreased balance, decreased coordination, decreased endurance, decreased knowledge of condition, decreased knowledge of use of DME, decreased mobility, difficulty walking, decreased ROM, decreased strength, increased edema, impaired perceived functional ability, increased muscle spasms, impaired flexibility, impaired sensation, impaired tone, improper body mechanics, postural dysfunction, obesity, and pain.   ACTIVITY LIMITATIONS: carrying, lifting, bending, standing, squatting, stairs, transfers, bed mobility, continence, bathing, toileting, dressing, hygiene/grooming,  locomotion level, and caring for others  PARTICIPATION LIMITATIONS: meal prep, cleaning, laundry, interpersonal relationship, driving, shopping, community activity, occupation, yard work, and  difficulty with or unable to complete any activity that requires weight bearing, use of B LE, and/or balance including working, household and community mobility, walking, driving, going to family gatherings, avoiding incontinent episodes, playing with daughter, helping around  the house, bed mobility, transfers  PERSONAL FACTORS: Past/current experiences, Time since onset of injury/illness/exacerbation, and 3+ comorbidities:  3 thoracic spine surgeries, thoracic MRI notes 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 Gillie on 05/25/2022, adhesive arachnoiditis, nerve pain are also affecting patient's functional outcome.   REHAB POTENTIAL: Fair due to severity and nature of condition.   CLINICAL DECISION MAKING: Evolving/moderate complexity  EVALUATION COMPLEXITY: Moderate   GOALS: Goals reviewed with patient? No  SHORT TERM GOALS: Target date: 01/09/2023. Target date updated to 05/28/2023 for all unmet goals on 03/05/2023.   Patient will be independent with initial home exercise program for self-management of symptoms. Baseline: Initial HEP to be provided at visit 2 as appropriate (12/26/22); Goal status: MET  LONG TERM GOALS: Target date: 03/20/2023. Target date updated to 08/26/23  for all unmet goals on 06/03/2023. Target date updated to 11/20/2023 for all  unmet goals on 08/28/2023. Target date updated to 02/17/2024  for all unmet goals on 11/25/2023. Target date updated to 04/14/2024 for all unmet  goals on 01/22/2024. Updated to 07/13/2024 for all unmet goals on 04/20/2024.   Patient will be independent with a long-term home exercise program for self-management of symptoms.  Baseline: Initial HEP to be provided at visit 2 as appropriate (12/26/22); participating as able (01/31/2023); patient currently participating as tolerated (03/05/2023; 04/09/2023; 06/03/2023); participates daily in mat exercises and in weight bearing exercises as tolerated (10/08/2023; 11/25/2023); Tries to do daily but sometimes cannot when his body won't let him (01/22/2024); participating as able including activities he finds way to mimic at home that he learns in clinic (03/17/2024); continues to complete exercises as tolerated (04/20/2024);  Goal status: In-progress  2.  Patient will demonstrate improved FOTO by equal or greater than 10 points by visit #13 to demonstrate improvement in overall condition and self-reported functional ability.  Baseline: to be tested visit 2 as appropriate (12/26/22); 30 at visit #3 (01/02/2023); 46 at visit #13 (03/05/2023); 45 at visit #29 (06/03/2023); 49 at visit #50 (10/08/2023);  Goal status: MET  3.  Patient will demonstrate the ability to ambulate equal or greater than 600 feet with LRAD during the 6 minute walk to improve his household and community mobility.  Baseline: 34 feet with bari-RW (12/26/22); 200 feet with bari-RW (01/31/2023); 500 feet with BRW (03/05/2023); 400 feet with BRW (04/09/2023); 152 feet with BRW and W/C follow for safety (06/03/2023); 330 feet with BRW (10/08/2023); 245 feet with BRW and SBA for safety (11/25/2023); 343 feet with BRW and SBA for safety (01/22/2024); 340 feet with BRW and SBA for safety (04/20/2024);  Goal status: In progress  4.  Patient will complete 5 Time Sit To Stand Test from 19.5 inch surface or lower in equal or less than 15 seconds with no UE support to demonstrate improved transfer ability for improved household and toileting mobility.  Baseline: 23  seconds with heavy B UE support on RW from 19.5 inch plinth. Pain throbbing down the right LE.  (12/26/22); 19 seconds with heavy B UE support on RW from 19.5 inch plinth (03/05/2023); 20 seconds with heavy B UE support on RRW from 18.5 inch plinth. Painful in back of B calves, thighs, and lower back (04/09/2023); 15 seconds with  heavy B UE support on BRW from 18.5 inch plinth. Painful in back of B calves, thighs, and lower back (06/03/2023); 19 seconds with heavy B UE support on BRW from 18.5 inch plinth. Painful in back of B calves, thighs, and lower back (10/08/2023); 18 seconds with heavy B UE support on BRW from 18.5 inch plinth. Painful in back of B calves, thighs, and lower back (11/25/2023); 15seconds with heavy B UE support on BRW from 18.5 inch plinth. Painful in back of B calves, thighs, and lower back (01/22/2024);  14 seconds with heavy B UE support on BRW from 18.5 inch plinth. Painful in back of B calves, thighs, and lower back (04/20/2024);  Goal status: Progressing   5.  Patient will report worst pain equal or less than 3/10 with functional activities to improve his ability to complete basic household and community mobility. Baseline: up to 8/10 (12/26/22); reports 3/10 pain (01/31/2023); reports 4/10 pain (02/19/2023); up to 7/10 in the last 2 weeks (03/05/2023): up to  7/10 in the last two weeks (04/09/2023); up to 8/10 in the last 2 weeks (06/03/2023);  up to 9/10 over the last 2 weeks (10/08/2023); up to 7-8/10 in the last 2 weeks (11/25/2023); 7/10 in the last 2 weeks (01/22/2024); better controlled in last 2 weeks but no specific number obtained (03/18/2024);  Goal status: Ongoing  6.  Patient will demonstrate improvement in Patient Specific Functional Scale (PSFS) by equal or greater than 3/10 points to reflect clinically significant improvement in patient's most valued functional activities. Baseline: 1.7/10 for Walking Around [address], Enjoying Life, and Driving (97/74/74); 7.2/89 (11/25/2023); 2.7/10  (01/22/24); 2.7/10 (04/20/2024);  Goal status: ongoing  PLAN:  PT FREQUENCY: 1-2x/week PT DURATION: 12 weeks PLANNED INTERVENTIONS: Therapeutic exercises, Therapeutic activity, Neuromuscular re-education, Balance training, Gait training, Patient/Family education, Self Care, Joint mobilization, Stair training, Orthotic/Fit training, DME instructions, Aquatic Therapy, Dry Needling, Electrical stimulation, Wheelchair mobility training, Spinal mobilization, Cryotherapy, Moist heat, Manual therapy, and Re-evaluation.  PLAN FOR NEXT SESSION:  update HEP as appropriate, neurodynamics, gait training, LE/core/functional strengthening, stretching, and balance as tolerated, education, manual therapy as needed. Pelvic floor.   Camie SAUNDERS. Juli, PT, DPT, Cert. MDT 05/13/24, 4:50 PM  [facility] [address] [place], [place] [address] P: [phone] I F: [phone]

## 2024-05-21 ENCOUNTER — Encounter: Admitting: Physical Therapy

## 2024-05-24 ENCOUNTER — Other Ambulatory Visit: Payer: Self-pay | Admitting: Medical Genetics

## 2024-05-24 DIAGNOSIS — Z006 Encounter for examination for normal comparison and control in clinical research program: Secondary | ICD-10-CM

## 2024-06-02 ENCOUNTER — Ambulatory Visit

## 2024-06-04 ENCOUNTER — Ambulatory Visit: Admitting: Physical Therapy

## 2024-06-04 ENCOUNTER — Encounter: Payer: Self-pay | Admitting: Physical Therapy

## 2024-06-04 DIAGNOSIS — R262 Difficulty in walking, not elsewhere classified: Secondary | ICD-10-CM

## 2024-06-04 DIAGNOSIS — M5459 Other low back pain: Secondary | ICD-10-CM

## 2024-06-04 DIAGNOSIS — M5416 Radiculopathy, lumbar region: Secondary | ICD-10-CM

## 2024-06-04 DIAGNOSIS — M6281 Muscle weakness (generalized): Secondary | ICD-10-CM

## 2024-06-04 DIAGNOSIS — R29818 Other symptoms and signs involving the nervous system: Secondary | ICD-10-CM

## 2024-06-04 DIAGNOSIS — R296 Repeated falls: Secondary | ICD-10-CM

## 2024-06-04 NOTE — Therapy (Signed)
 OUTPATIENT PHYSICAL THERAPY TREATMENT  Patient Name: Travis Fitzpatrick MRN: [id number] DOB:21-Mar-1981, 43 y.o., male Today's Date: 06/04/24   END OF SESSION:  PT End of Session - 06/04/24 1739     Visit Number 89    Number of Visits 108    Date for Recertification  07/13/24    Authorization Type MEDICARE PART B reporting period from 03/18/2024    Progress Note Due on Visit 80    PT Start Time 1737    PT Stop Time 1815    PT Time Calculation (min) 38 min    Activity Tolerance Patient limited by pain;Patient tolerated treatment well    Behavior During Therapy [facility] for tasks assessed/performed          Past Medical History:  Diagnosis Date   Bowel trouble    urgency   Medical history non-contributory    Urinary urgency    Past Surgical History:  Procedure Laterality Date   BACK SURGERY  2010   CIRCUMCISION     LUMBAR LAMINECTOMY/DECOMPRESSION MICRODISCECTOMY  07/20/2011   Procedure: LUMBAR LAMINECTOMY/DECOMPRESSION MICRODISCECTOMY;  Surgeon: Rockey LITTIE Peru;  Location: [facility];  Service: Neurosurgery;  Laterality: N/A;  right thoracotomy with thoracic eight-nine discectomy and fusion   LUMBAR LAMINECTOMY/DECOMPRESSION MICRODISCECTOMY Right 10/06/2021   Procedure: Right Lumbar Four-Five Microdiscectomy, Right Lumbar Five-Sacal One Foraminotomy;  Surgeon: Peru Rockey, MD;  Location: [facility];  Service: Neurosurgery;  Laterality: Right;  3C/RM 21   THORACIC DISCECTOMY  07/16/2012   Procedure: THORACIC DISCECTOMY;  Surgeon: Rockey LITTIE Peru, MD;  Location: [facility];  Service: Neurosurgery;  Laterality: Right;  RIGHT Thoracic seven-eight  thoracic diskectomy via thoracotomy by dr lucas   THORACIC DISCECTOMY N/A 12/15/2014   Procedure: THORACIC SEVEN TO THORACIC NINE Laminectomy ;  Surgeon: Rockey Peru, MD;  Location: [facility];  Service: Neurosurgery;  Laterality: N/A;   THORACIC DISCECTOMY N/A 06/06/2017   Procedure: LAMINECTOMY THORACIC NINE-TEN;  Surgeon: Peru Rockey, MD;   Location: [facility];  Service: Neurosurgery;  Laterality: N/A;  LAMINECTOMY THORACIC NINE-TEN   THORACOTOMY  07/20/2011   Procedure: THORACOTOMY OPEN FOR SPINE SURGERY;  Surgeon: JONETTA Belvie Nam, MD;  Location: [facility];  Service: Vascular;  Laterality: N/A;   THORACOTOMY  07/16/2012   Procedure: THORACOTOMY OPEN FOR SPINE SURGERY;  Surgeon: Dorise MARLA lucas, MD;  Location: [facility];  Service: Thoracic;  Laterality: N/A;   Patient Active Problem List   Diagnosis Date Noted   Carpal tunnel syndrome on right 02/19/2024   Spasticity 09/25/2023   Nerve pain 12/05/2022   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   S/P lumbar spinal fusion 05/25/2022   Synovial cyst of lumbar facet joint 05/25/2022   HNP (herniated nucleus pulposus), lumbar 10/06/2021   Hand pain 06/28/2021   Lumbar facet arthropathy 01/11/2021   Thoracic spondylosis with myelopathy 01/11/2021   Thoracic spinal stenosis 06/06/2017   Stenosis, spinal, thoracic 12/15/2014   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 09/22/2014   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/2011    PCP: Maryl Sayres  REFERRING PROVIDER: Babs Arthea DASEN, MD  REFERRING DIAG: thoracic disc disease with myelopathy, nerve pain, adhesive arachnoiditis  Rationale for Evaluation and Treatment: Rehabilitation  THERAPY DIAG:  Other low back pain  Other symptoms and signs involving the nervous system  Radiculopathy, lumbar region  Muscle weakness (generalized)  Repeated falls  Difficulty in walking, not elsewhere classified  ONSET DATE:  Suddenly started having weakness 11 years ago, most recent episode  of worsening October 2022, s/p right L4-5 laminotomy, microdiscectomy on 10/06/2021, now s/p L4-5 PLIF by Dr. Gillie on 05/25/2022.  PERTINENT HISTORY:  Patient is a 42 y.o. male who presents to outpatient physical therapy with a referral for medical diagnosis thoracic disc disease with myelopathy, nerve pain,  adhesive arachnoiditis. This patient's chief complaints consist of disabling low back and R >L leg pain and weakness with activity, bowel and bladder urgency, incontinence, and retention, leading to the following functional deficits: difficulty with or unable to complete any activity that requires weight bearing, use of B LE, and/or balance including working, household and community mobility, walking, driving, going to family gatherings, avoiding incontinent episodes, playing with daughter, helping around the house, bed mobility, transfers. 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 Gillie on 05/25/2022, adhesive arachnoiditis, nerve pain.  Patient denies hx of cancer, stroke, seizures, lung problems, heart problems, diabetes, unexplained weight loss, and osteoporosis.  SUBJECTIVE:                                                                                                                                                                                           SUBJECTIVE STATEMENT:     Patient arrives with BRW today. He states his right leg has been trying to give out on him more so he has missed therapy. He has not been stuck on the bed but when he walks on it it hurts his left knee has been feeling like it is going to give out. He states he has not fell in the last 3-4 weeks.   PAIN:  NPRS:  3/10 mostly in low back, right groin/hip, 1-2/10 in bilateral posterior thighs, right calf/shin.  PRECAUTIONS: Fall  PATIENT GOALS: To get better, walk, drive again, climb stairs, play with  my daughter, help around the house again  OBJECTIVE  TREATMENT  Therapeutic activities: dynamic therapeutic activities incorporating MULTIPLE parameters or areas of the body designed to achieve improved functional performance.  UE support squats at squat cage (holding on to BB to support bodyweight and offload lumbar spine and LE). Chair placed behind, RW in front, BB at 3rd hole down.  1x6 (7th failed) 1x6 1x6 1x5 (failed last rep due to back spasm and needed to be caught by PT with chair) 1x6  1x6 Seated rests of approx 1 min between sets  Perching (glutes propped on elevated plinth with feet on floor, knees below hips) hip flexion with ankle DF with 2# AW draped over top of each foot.  3x10 each side R LE feels like it is bothered by pressure from edge of plinth at the back of the right glute.   Ambulation approx 100 feet down ramp from clinic to vehicle with BRW and SBA chair follow for safety due to LE instability.   Therapeutic exercise: therapeutic exercises that incorporate ONE parameter at one or more areas of the body to centralize symptoms, develop strength and endurance, range of motion, and flexibility required for successful completion of functional activities.  Seated quad extension on OMEGA machine, seat position 3 B LE 1x7 at 25# 1x10 at 25# 1x8 at 30# 3x6 at 35# 1 min rests between sets  Pt required multimodal cuing for proper technique and to facilitate improved neuromuscular control, strength, range of motion, and functional ability resulting in improved performance and form.  PATIENT EDUCATION:  Education details: Exercise purpose/form. Self management techniques. POC.  Person educated: patient Education method: Explanation Education comprehension: verbalized understanding and needs further education  HOME EXERCISE PROGRAM: Verbally:  - seated lumbar flexion stretch with head down and one LE extended using rollator, 1x10 each side.  - curl up with legs  elevated, 5x15 seconds - sidelying open book 1x10 each side  Access Code: [email or web address] URL: [email or web address] Date: 12/18/2023 Prepared by: Camie Cleverly  Exercises - Seated Diaphragmatic Breathing  - 3 reps - 1 minute practice time - Seated Pelvic Floor Elevators  - 3 x daily - 2 sets - 10 reps - Seated Ankle Plantar Flexion with Resistance Loop  - 1 x daily - 3 sets - 20 reps - Seated Ankle Dorsiflexion AROM  - 1 x daily - 3 sets - 10-30 reps - Seated Ankle Inversion AROM  - 1 x daily - 3 sets - 10-30 reps - Seated Ankle Eversion AROM  - 1 x daily - 3 sets - 10-30 reps - Sidelying Ankle Eversion Strengthening with Ankle Weight  - 1 x daily - 3 sets - 20 reps - Sidelying Ankle Inversion with Ankle Weight  - 1 x daily - 3 sets - 20 reps - Seated Ankle Dorsiflexion with Ankle Weight  - 1 x daily - 3 sets - 20 reps  ASSESSMENT:  CLINICAL IMPRESSION:     Patient returns to PT today after 3 weeks away due to scheduling limitations and period where he has had increased gait instability. Today's session focused on return to previously tolerated exercises. He had one instance of legs giving out when his back spasmed suddenly during UE assisted squats that required PT physical assist to prevent falling. He was able to increased load on B knee extension machine by 10# from 25 to 35#. He had more trouble than last visit with seated hip flexion with ankle DF at end of visit, and was provided a W/C follow for safety to his vehicle at the end of the session. He reported increased in soreness by end of session. Continued PT Is medically necessary to maximize function and quality of life and prevent decline in functional independene that would lead to need for higher level of care in the setting of a chronic neurodegenerative condition. Patient would benefit from continued management of limiting condition by skilled physical therapist to address remaining impairments and functional limitations  to work towards stated goals and return  to PLOF or maximal functional independence.    OBJECTIVE IMPAIRMENTS: Abnormal gait, decreased activity tolerance, decreased balance, decreased coordination, decreased endurance, decreased knowledge of condition, decreased knowledge of use of DME, decreased mobility, difficulty walking, decreased ROM, decreased strength, increased edema, impaired perceived functional ability, increased muscle spasms, impaired flexibility, impaired sensation, impaired tone, improper body mechanics, postural dysfunction, obesity, and pain.   ACTIVITY LIMITATIONS: carrying, lifting, bending, standing, squatting, stairs, transfers, bed mobility, continence, bathing, toileting, dressing, hygiene/grooming, locomotion level, and caring for others  PARTICIPATION LIMITATIONS: meal prep, cleaning, laundry, interpersonal relationship, driving, shopping, community activity, occupation, yard work, and  difficulty with or unable to complete any activity that requires weight bearing, use of B LE, and/or balance including working, household and community mobility, walking, driving, going to family gatherings, avoiding incontinent episodes, playing with daughter, helping around the house, bed mobility, transfers  PERSONAL FACTORS: Past/current experiences, Time since onset of injury/illness/exacerbation, and 3+ comorbidities:  3 thoracic spine surgeries, thoracic MRI notes 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 Gillie on 05/25/2022, adhesive arachnoiditis, nerve pain are also affecting patient's functional outcome.   REHAB POTENTIAL: Fair due to severity and nature of condition.    CLINICAL DECISION MAKING: Evolving/moderate complexity  EVALUATION COMPLEXITY: Moderate   GOALS: Goals reviewed with patient? No  SHORT TERM GOALS: Target date: 01/09/2023. Target date updated to 05/28/2023 for all unmet goals on 03/05/2023.   Patient will be independent with initial home exercise program for self-management of symptoms. Baseline: Initial HEP to be provided at visit 2 as appropriate (12/26/22); Goal status: MET  LONG TERM GOALS: Target date: 03/20/2023. Target date updated to 08/26/23  for all unmet goals on 06/03/2023. Target date updated to 11/20/2023 for all  unmet goals on 08/28/2023. Target date updated to 02/17/2024  for all unmet goals on 11/25/2023. Target date updated to 04/14/2024 for all unmet goals on 01/22/2024. Updated to 07/13/2024 for all unmet goals on 04/20/2024.   Patient will be independent with a long-term home exercise program for self-management of symptoms.  Baseline: Initial HEP to be provided at visit 2 as appropriate (12/26/22); participating as able (01/31/2023); patient currently participating as tolerated (03/05/2023; 04/09/2023; 06/03/2023); participates daily in mat exercises and in weight bearing exercises as tolerated (10/08/2023; 11/25/2023); Tries to do daily but sometimes cannot when his body won't let him (01/22/2024); participating as able including activities he finds way to mimic at home that he learns in clinic (03/17/2024); continues to complete exercises as tolerated (04/20/2024);  Goal status: In-progress  2.  Patient will demonstrate improved FOTO by equal or greater than 10 points by visit #13 to demonstrate improvement in overall condition and self-reported functional ability.  Baseline: to be tested visit 2 as appropriate (12/26/22); 30 at visit #3 (01/02/2023); 46 at visit #13 (03/05/2023); 45 at visit #29 (06/03/2023); 49 at visit #50 (10/08/2023);  Goal status: MET  3.  Patient will demonstrate the ability to ambulate equal or greater than 600 feet  with LRAD during the 6 minute walk to improve his household and community mobility.  Baseline: 34 feet with bari-RW (12/26/22); 200 feet with bari-RW (01/31/2023); 500 feet with BRW (03/05/2023); 400 feet with BRW (04/09/2023); 152 feet with BRW and W/C follow for  safety (06/03/2023); 330 feet with BRW (10/08/2023); 245 feet with BRW and SBA for safety (11/25/2023); 343 feet with BRW and SBA for safety (01/22/2024); 340 feet with BRW and SBA for safety (04/20/2024);  Goal status: In progress  4.  Patient will complete 5 Time Sit To Stand Test from 19.5 inch surface or lower in equal or less than 15 seconds with no UE support to demonstrate improved transfer ability for improved household and toileting mobility.  Baseline: 23 seconds with heavy B UE support on RW from 19.5 inch plinth. Pain throbbing down the right LE.  (12/26/22); 19 seconds with heavy B UE support on RW from 19.5 inch plinth (03/05/2023); 20 seconds with heavy B UE support on RRW from 18.5 inch plinth. Painful in back of B calves, thighs, and lower back (04/09/2023); 15 seconds with heavy B UE support on BRW from 18.5 inch plinth. Painful in back of B calves, thighs, and lower back (06/03/2023); 19 seconds with heavy B UE support on BRW from 18.5 inch plinth. Painful in back of B calves, thighs, and lower back (10/08/2023); 18 seconds with heavy B UE support on BRW from 18.5 inch plinth. Painful in back of B calves, thighs, and lower back (11/25/2023); 15seconds with heavy B UE support on BRW from 18.5 inch plinth. Painful in back of B calves, thighs, and lower back (01/22/2024);  14 seconds with heavy B UE support on BRW from 18.5 inch plinth. Painful in back of B calves, thighs, and lower back (04/20/2024);  Goal status: Progressing   5.  Patient will report worst pain equal or less than 3/10 with functional activities to improve his ability to complete basic household and community mobility. Baseline: up to 8/10 (12/26/22); reports 3/10 pain  (01/31/2023); reports 4/10 pain (02/19/2023); up to 7/10 in the last 2 weeks (03/05/2023): up to  7/10 in the last two weeks (04/09/2023); up to 8/10 in the last 2 weeks (06/03/2023);  up to 9/10 over the last 2 weeks (10/08/2023); up to 7-8/10 in the last 2 weeks (11/25/2023); 7/10 in the last 2 weeks (01/22/2024); better controlled in last 2 weeks but no specific number obtained (03/18/2024);  Goal status: Ongoing  6.  Patient will demonstrate improvement in Patient Specific Functional Scale (PSFS) by equal or greater than 3/10 points to reflect clinically significant improvement in patient's most valued functional activities. Baseline: 1.7/10 for Walking Around [address], Enjoying Life, and Driving (97/74/74); 7.2/89 (11/25/2023); 2.7/10 (01/22/24); 2.7/10 (04/20/2024);  Goal status: ongoing  PLAN:  PT FREQUENCY: 1-2x/week PT DURATION: 12 weeks PLANNED INTERVENTIONS: Therapeutic exercises, Therapeutic activity, Neuromuscular re-education, Balance training, Gait training, Patient/Family education, Self Care, Joint mobilization, Stair training, Orthotic/Fit training, DME instructions, Aquatic Therapy, Dry Needling, Electrical stimulation, Wheelchair mobility training, Spinal mobilization, Cryotherapy, Moist heat, Manual therapy, and Re-evaluation.  PLAN FOR NEXT SESSION: Continue with functional and mobility training as tolerated.  update HEP as appropriate, neurodynamics, gait training, LE/core/functional strengthening, stretching, and balance as tolerated, education, manual therapy as needed. Pelvic floor.   Camie SAUNDERS. Juli, PT, DPT, Cert. MDT 06/04/24, 7:15 PM  [facility] [address] [place], [place] [address] P: [phone] I F: [phone]

## 2024-06-09 ENCOUNTER — Other Ambulatory Visit: Payer: Self-pay | Admitting: Physical Medicine & Rehabilitation

## 2024-06-09 DIAGNOSIS — M792 Neuralgia and neuritis, unspecified: Secondary | ICD-10-CM

## 2024-06-09 DIAGNOSIS — M5104 Intervertebral disc disorders with myelopathy, thoracic region: Secondary | ICD-10-CM

## 2024-06-09 DIAGNOSIS — G039 Meningitis, unspecified: Secondary | ICD-10-CM

## 2024-06-11 ENCOUNTER — Ambulatory Visit: Admitting: Physical Therapy

## 2024-06-15 ENCOUNTER — Ambulatory Visit: Attending: Physical Medicine & Rehabilitation | Admitting: Physical Therapy

## 2024-06-15 ENCOUNTER — Encounter: Payer: Self-pay | Admitting: Physical Therapy

## 2024-06-15 DIAGNOSIS — M5459 Other low back pain: Secondary | ICD-10-CM | POA: Insufficient documentation

## 2024-06-15 DIAGNOSIS — R262 Difficulty in walking, not elsewhere classified: Secondary | ICD-10-CM | POA: Diagnosis present

## 2024-06-15 DIAGNOSIS — M5416 Radiculopathy, lumbar region: Secondary | ICD-10-CM | POA: Diagnosis present

## 2024-06-15 DIAGNOSIS — R296 Repeated falls: Secondary | ICD-10-CM | POA: Insufficient documentation

## 2024-06-15 DIAGNOSIS — M6281 Muscle weakness (generalized): Secondary | ICD-10-CM | POA: Insufficient documentation

## 2024-06-15 DIAGNOSIS — R29818 Other symptoms and signs involving the nervous system: Secondary | ICD-10-CM | POA: Diagnosis present

## 2024-06-15 NOTE — Therapy (Unsigned)
 OUTPATIENT PHYSICAL THERAPY TREATMENT  Patient Name: Travis Fitzpatrick MRN: [id number] DOB:08-03-1981, 43 y.o., male Today's Date: 06/15/24   END OF SESSION:  PT End of Session - 06/15/24 1744     Visit Number 90    Number of Visits 108    Date for Recertification  09/07/24    Authorization Type MEDICARE PART B reporting period from 03/18/2024    Progress Note Due on Visit 80    PT Start Time 1650    PT Stop Time 1728    PT Time Calculation (min) 38 min    Activity Tolerance Patient limited by pain;Patient tolerated treatment well    Behavior During Therapy [facility] for tasks assessed/performed           Past Medical History:  Diagnosis Date   Bowel trouble    urgency   Medical history non-contributory    Urinary urgency    Past Surgical History:  Procedure Laterality Date   BACK SURGERY  2010   CIRCUMCISION     LUMBAR LAMINECTOMY/DECOMPRESSION MICRODISCECTOMY  07/20/2011   Procedure: LUMBAR LAMINECTOMY/DECOMPRESSION MICRODISCECTOMY;  Surgeon: Rockey LITTIE Peru;  Location: [facility];  Service: Neurosurgery;  Laterality: N/A;  right thoracotomy with thoracic eight-nine discectomy and fusion   LUMBAR LAMINECTOMY/DECOMPRESSION MICRODISCECTOMY Right 10/06/2021   Procedure: Right Lumbar Four-Five Microdiscectomy, Right Lumbar Five-Sacal One Foraminotomy;  Surgeon: Peru Rockey, MD;  Location: [facility];  Service: Neurosurgery;  Laterality: Right;  3C/RM 21   THORACIC DISCECTOMY  07/16/2012   Procedure: THORACIC DISCECTOMY;  Surgeon: Rockey LITTIE Peru, MD;  Location: [facility];  Service: Neurosurgery;  Laterality: Right;  RIGHT Thoracic seven-eight  thoracic diskectomy via thoracotomy by dr lucas   THORACIC DISCECTOMY N/A 12/15/2014   Procedure: THORACIC SEVEN TO THORACIC NINE Laminectomy ;  Surgeon: Rockey Peru, MD;  Location: [facility];  Service: Neurosurgery;  Laterality: N/A;   THORACIC DISCECTOMY N/A 06/06/2017   Procedure: LAMINECTOMY THORACIC NINE-TEN;  Surgeon: Peru Rockey, MD;   Location: [facility];  Service: Neurosurgery;  Laterality: N/A;  LAMINECTOMY THORACIC NINE-TEN   THORACOTOMY  07/20/2011   Procedure: THORACOTOMY OPEN FOR SPINE SURGERY;  Surgeon: JONETTA Belvie Nam, MD;  Location: [facility];  Service: Vascular;  Laterality: N/A;   THORACOTOMY  07/16/2012   Procedure: THORACOTOMY OPEN FOR SPINE SURGERY;  Surgeon: Dorise MARLA Lucas, MD;  Location: [facility];  Service: Thoracic;  Laterality: N/A;   Patient Active Problem List   Diagnosis Date Noted   Carpal tunnel syndrome on right 02/19/2024   Spasticity 09/25/2023   Nerve pain 12/05/2022   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   S/P lumbar spinal fusion 05/25/2022   Synovial cyst of lumbar facet joint 05/25/2022   HNP (herniated nucleus pulposus), lumbar 10/06/2021   Hand pain 06/28/2021   Lumbar facet arthropathy 01/11/2021   Thoracic spondylosis with myelopathy 01/11/2021   Thoracic spinal stenosis 06/06/2017   Stenosis, spinal, thoracic 12/15/2014   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 09/22/2014   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/2011    PCP: Maryl Sayres  REFERRING PROVIDER: Babs Arthea DASEN, MD  REFERRING DIAG: thoracic disc disease with myelopathy, nerve pain, adhesive arachnoiditis  Rationale for Evaluation and Treatment: Rehabilitation  THERAPY DIAG:  Other low back pain  Other symptoms and signs involving the nervous system  Radiculopathy, lumbar region  Muscle weakness (generalized)  Repeated falls  Difficulty in walking, not elsewhere classified  ONSET DATE:  Suddenly started having weakness 11 years ago, most recent  episode of worsening October 2022, s/p right L4-5 laminotomy, microdiscectomy on 10/06/2021, now s/p L4-5 PLIF by Dr. Gillie on 05/25/2022.  PERTINENT HISTORY:  Patient is a 43 y.o. male who presents to outpatient physical therapy with a referral for medical diagnosis thoracic disc disease with myelopathy, nerve pain,  adhesive arachnoiditis. This patient's chief complaints consist of disabling low back and R >L leg pain and weakness with activity, bowel and bladder urgency, incontinence, and retention, leading to the following functional deficits: difficulty with or unable to complete any activity that requires weight bearing, use of B LE, and/or balance including working, household and community mobility, walking, driving, going to family gatherings, avoiding incontinent episodes, playing with daughter, helping around the house, bed mobility, transfers. 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 Gillie on 05/25/2022, adhesive arachnoiditis, nerve pain.  Patient denies hx of cancer, stroke, seizures, lung problems, heart problems, diabetes, unexplained weight loss, and osteoporosis.  SUBJECTIVE:                                                                                                                                                                                           SUBJECTIVE STATEMENT:     Patient arrives with BRW today. He states that after he got home after last PT session his pain weakness got worse until he could not walk. He states this time his left leg as well as his right leg got so he could not put pressure on it. He missed PT last week because he was unable to ambulate to come in. It started feeling better this past Saturday, when he was able to walk to the bathroom again. He states it is still a little weak and if he moves a certain way it will try to go out on me. This is mostly  his left leg. His right leg did not get worse than usual after last PT session. Increased weakness on both sides. He has been doing his bed level HEP daily throughout the day and more when his wife gets home (the stuff he cannot do by himself). He fell once when he got home after last PT session. He got in the house and was going towards the bathroom to take a bath. He was going down the hallway and  his leg gave out and he fell (felt like lower back went out with a sharp pinch affecting left leg). He was not injured and able to get up with wife's help.   PAIN:  NPRS:   2/10 middle lower back, right hip/groin area, and back of bilateral thighs (1/10), right anterior lower leg.   PRECAUTIONS: Fall  PATIENT GOALS: To get better, walk, drive again, climb stairs, play with my daughter, help around the house again  OBJECTIVE  SELF-REPORTED FUNCTION Patient Specific Functional Scale (PSFS)  Walking Around People: 3/10 Enjoying Life: 5/10 Driving: 9/89 Average: 7.2/89   FUNCTIONAL/BALANCE TESTS:   Five Time Sit to Stand (5TSTS):  Purpose: to assess LE functional strength and power for functional mobility.  Score: 13 seconds from 18.5 inch plinth heavy B UE support on BRW, leaning on table with back of legs when he lost his balance backwards but did not need PT assistance.  Analysis: patient scored significantly worse than the 7.61.8 sec that is the norm for healthy males of his age and similar last PT progress note. He also required heavy UE support while test norms assume UE crossed over chest. This demonstrates a significantly worse LE strength and power than would be expected for his age group and is actually worse than the norm for 40-62 year old community dwelling adults, which is 10.6 + 3.4 seconds. His score is 1 second better than was recorded last on 04/20/2024 which was 14 seconds with similar UE support. His score was better than his baseline of 23 seconds at the start of current episode  of care.     6 Minute Walk Test:  Purpose: to assess function with community mobility Score: 412 feet with BRW.  Test concluded early with 1:25 min remaining due to low back, R hip, and nerve pain as well as progressive LE weakness. He especially felt pinching in the low back and weakness in the L LE.  Pain up to 6/10.   Analysis: patient ambulated significantly less than 1890 feet which is the norm for healthy males of his age, and he ambulated almost 100 feet more distance as at last progress note on 04/20/2024. He continues to be limited by low back pain, R hip pain, and neurologic pain and weakness in B LE with weight bearing activities.     TREATMENT  Therapeutic activities: dynamic therapeutic activities incorporating MULTIPLE parameters or areas of the body designed to achieve improved functional performance.  UE support squats at squat cage (holding on to BB to support bodyweight and offload lumbar spine and LE). Chair placed behind, RW in front, BB at 3rd hole down.  1x5 1x5 1x5 1x5 1x5 Seated rests of approx 1 min between sets  Ambulation approx 100 feet down ramp from clinic to vehicle with BRW and SBA chair follow for safety due to LE instability.   Therapeutic exercise: therapeutic exercises that incorporate ONE parameter at one or more areas of the body to centralize symptoms, develop strength and endurance, range of motion, and flexibility required for successful completion of functional activities.  Seated quad extension on OMEGA machine, seat position 3 B LE 1x7 at 25# 1x10 at 25# 1x8 at 30# 3x6 at 35# 1 min rests between sets  Pt required multimodal cuing for proper technique and to facilitate improved neuromuscular control, strength, range of motion, and functional ability resulting in improved performance and form.  PATIENT EDUCATION:  Education details: Exercise purpose/form. Self management techniques. POC.  Person educated: patient Education  method:  Explanation Education comprehension: verbalized understanding and needs further education  HOME EXERCISE PROGRAM: Verbally:  - seated lumbar flexion stretch with head down and one LE extended using rollator, 1x10 each side.  - curl up with legs elevated, 5x15 seconds - sidelying open book 1x10 each side  Access Code: [email or web address] URL: [email or web address] Date: 12/18/2023 Prepared by: Camie Cleverly  Exercises - Seated Diaphragmatic Breathing  - 3 reps - 1 minute practice time - Seated Pelvic Floor Elevators  - 3 x daily - 2 sets - 10 reps - Seated Ankle Plantar Flexion with Resistance Loop  - 1 x daily - 3 sets - 20 reps - Seated Ankle Dorsiflexion AROM  - 1 x daily - 3 sets - 10-30 reps - Seated Ankle Inversion AROM  - 1 x daily - 3 sets - 10-30 reps - Seated Ankle Eversion AROM  - 1 x daily - 3 sets - 10-30 reps - Sidelying Ankle Eversion Strengthening with Ankle Weight  - 1 x daily - 3 sets - 20 reps - Sidelying Ankle Inversion with Ankle Weight  - 1 x daily - 3 sets - 20 reps - Seated Ankle Dorsiflexion with Ankle Weight  - 1 x daily - 3 sets - 20 reps  ASSESSMENT:  CLINICAL IMPRESSION:     Pain level up to 3/10 by end of session. ***  Patient returns to PT today after 3 weeks away due to scheduling limitations and period where he has had increased gait instability. Today's session focused on return to previously tolerated exercises. He had one instance of legs giving out when his back spasmed suddenly during UE assisted squats that required PT physical assist to prevent falling. He was able to increased load on B knee extension machine by 10# from 25 to 35#. He had more trouble than last visit with seated hip flexion with ankle DF at end of visit, and was provided a W/C follow for safety to his vehicle at the end of the session. He reported increased in soreness by end of session. Continued PT Is medically necessary to maximize function and quality of life and prevent  decline in functional independene that would lead to need for higher level of care in the setting of a chronic neurodegenerative condition. Patient would benefit from continued management of limiting condition by skilled physical therapist to address remaining impairments and functional limitations to work towards stated goals and return to PLOF or maximal functional independence.    OBJECTIVE IMPAIRMENTS: Abnormal gait, decreased activity tolerance, decreased balance, decreased coordination, decreased endurance, decreased knowledge of condition, decreased knowledge of use of DME, decreased mobility, difficulty walking, decreased ROM, decreased strength, increased edema, impaired perceived functional ability, increased muscle spasms, impaired flexibility, impaired sensation, impaired tone, improper body mechanics, postural dysfunction, obesity, and pain.   ACTIVITY LIMITATIONS: carrying, lifting, bending, standing, squatting, stairs, transfers, bed mobility, continence, bathing, toileting, dressing, hygiene/grooming, locomotion level, and caring for others  PARTICIPATION LIMITATIONS: meal prep, cleaning, laundry, interpersonal relationship, driving, shopping, community activity, occupation, yard work, and  difficulty with or unable to complete any activity that requires weight bearing, use of B LE, and/or balance including working, household and community mobility, walking, driving, going to family gatherings, avoiding incontinent episodes, playing with daughter, helping around the house, bed mobility, transfers  PERSONAL FACTORS: Past/current experiences, Time since onset of injury/illness/exacerbation, and 3+ comorbidities:  3 thoracic spine surgeries, thoracic MRI notes 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 Gillie on 05/25/2022, adhesive arachnoiditis, nerve pain are also affecting patient's functional outcome.   REHAB POTENTIAL: Fair due to severity and nature of condition.   CLINICAL DECISION MAKING: Evolving/moderate complexity  EVALUATION COMPLEXITY: Moderate   GOALS: Goals reviewed with patient? No  SHORT TERM GOALS: Target date: 01/09/2023. Target date updated to 05/28/2023 for all unmet goals on 03/05/2023.   Patient will be independent with initial home exercise program for self-management of symptoms. Baseline: Initial HEP to be provided at visit 2 as appropriate (12/26/22); Goal status: MET  LONG TERM GOALS: Target date: 03/20/2023. Target date updated to 08/26/23  for all unmet goals on 06/03/2023. Target date updated to 11/20/2023 for all  unmet goals on 08/28/2023. Target date updated to 02/17/2024  for all unmet goals on 11/25/2023. Target date updated to 04/14/2024 for all unmet goals on 01/22/2024. Updated to 07/13/2024 for all unmet goals on 04/20/2024.   Patient will be independent with a long-term home exercise program for self-management of symptoms.  Baseline: Initial HEP to be provided at visit 2 as appropriate (12/26/22); participating as able (01/31/2023); patient currently participating as tolerated (03/05/2023; 04/09/2023; 06/03/2023); participates daily in mat exercises and in weight bearing exercises as tolerated (10/08/2023; 11/25/2023); Tries to do daily but sometimes cannot when his body won't let him (01/22/2024); participating as able including activities he finds way to mimic at home that he learns in clinic (03/17/2024); continues to complete exercises as tolerated (04/20/2024);  Goal status: In-progress  2.  Patient will demonstrate improved FOTO by equal or greater than 10 points by visit #13 to demonstrate improvement in overall condition and self-reported  functional ability.  Baseline: to be tested visit 2 as appropriate (12/26/22); 30 at visit #3 (01/02/2023); 46 at visit #13 (03/05/2023); 45 at visit #29 (06/03/2023); 49 at visit #50 (10/08/2023);  Goal status: MET  3.  Patient will demonstrate the ability to ambulate equal or greater than 600 feet with LRAD during the 6 minute walk to improve his household and community mobility.  Baseline: 34 feet with bari-RW (12/26/22); 200 feet with bari-RW (01/31/2023); 500 feet with BRW (03/05/2023); 400 feet with BRW (04/09/2023); 152 feet with BRW and W/C follow for safety (06/03/2023); 330 feet with BRW (10/08/2023); 245 feet with BRW and SBA for safety (11/25/2023); 343 feet with BRW and SBA for safety (01/22/2024); 340 feet with BRW and SBA for safety (04/20/2024);  Goal status: In progress  4.  Patient will complete 5 Time Sit To Stand Test from 19.5 inch surface or lower in equal or less than 15 seconds with no UE support to demonstrate improved transfer ability for improved household and toileting mobility.  Baseline: 23 seconds with heavy B UE support on RW from 19.5 inch plinth. Pain throbbing down the right LE.  (12/26/22); 19 seconds with heavy B UE support on RW from 19.5 inch plinth (03/05/2023); 20 seconds with heavy B UE support on RRW from 18.5 inch plinth. Painful in back of B calves, thighs, and lower back (04/09/2023); 15 seconds with heavy B UE support on BRW from 18.5 inch plinth. Painful in back of B calves, thighs, and lower back (06/03/2023); 19 seconds with heavy B UE support on BRW from 18.5 inch plinth. Painful in back of B calves, thighs,  and lower back (10/08/2023); 18 seconds with heavy B UE support on BRW from 18.5 inch plinth. Painful in back of B calves, thighs, and lower back (11/25/2023); 15seconds with heavy B UE support on BRW from 18.5 inch plinth. Painful in back of B calves, thighs, and lower back (01/22/2024);  14 seconds with heavy B UE support on BRW from 18.5 inch plinth. Painful in back  of B calves, thighs, and lower back (04/20/2024);  Goal status: Progressing   5.  Patient will report worst pain equal or less than 3/10 with functional activities to improve his ability to complete basic household and community mobility. Baseline: up to 8/10 (12/26/22); reports 3/10 pain (01/31/2023); reports 4/10 pain (02/19/2023); up to 7/10 in the last 2 weeks (03/05/2023): up to  7/10 in the last two weeks (04/09/2023); up to 8/10 in the last 2 weeks (06/03/2023);  up to 9/10 over the last 2 weeks (10/08/2023); up to 7-8/10 in the last 2 weeks (11/25/2023); 7/10 in the last 2 weeks (01/22/2024); better controlled in last 2 weeks but no specific number obtained (03/18/2024); 9/10 with medication (06/15/2024);  Goal status: Ongoing  6.  Patient will demonstrate improvement in Patient Specific Functional Scale (PSFS) by equal or greater than 3/10 points to reflect clinically significant improvement in patient's most valued functional activities. Baseline: 1.7/10 for Walking Around [address], Enjoying Life, and Driving (97/74/74); 7.2/89 (11/25/2023); 2.7/10 (01/22/24); 2.7/10 (04/20/2024); 2.7/10 (06/15/2024);  Goal status: ongoing  PLAN:  PT FREQUENCY: 1-2x/week PT DURATION: 12 weeks PLANNED INTERVENTIONS: Therapeutic exercises, Therapeutic activity, Neuromuscular re-education, Balance training, Gait training, Patient/Family education, Self Care, Joint mobilization, Stair training, Orthotic/Fit training, DME instructions, Aquatic Therapy, Dry Needling, Electrical stimulation, Wheelchair mobility training, Spinal mobilization, Cryotherapy, Moist heat, Manual therapy, and Re-evaluation.  PLAN FOR NEXT SESSION: Continue with functional and mobility training as tolerated.  update HEP as appropriate, neurodynamics, gait training, LE/core/functional strengthening, stretching, and balance as tolerated, education, manual therapy as needed. Pelvic floor.   Camie SAUNDERS. Juli, PT, DPT, Cert. MDT 06/15/24, 8:09 PM  [facility]  [facility] [address] [place], [place] [address] P: [phone] I F: [phone]

## 2024-06-17 ENCOUNTER — Encounter: Payer: Self-pay | Admitting: Physical Therapy

## 2024-06-17 ENCOUNTER — Ambulatory Visit: Admitting: Physical Therapy

## 2024-06-17 DIAGNOSIS — R29818 Other symptoms and signs involving the nervous system: Secondary | ICD-10-CM

## 2024-06-17 DIAGNOSIS — R262 Difficulty in walking, not elsewhere classified: Secondary | ICD-10-CM

## 2024-06-17 DIAGNOSIS — M5459 Other low back pain: Secondary | ICD-10-CM | POA: Diagnosis not present

## 2024-06-17 DIAGNOSIS — M6281 Muscle weakness (generalized): Secondary | ICD-10-CM

## 2024-06-17 DIAGNOSIS — M5416 Radiculopathy, lumbar region: Secondary | ICD-10-CM

## 2024-06-17 DIAGNOSIS — R296 Repeated falls: Secondary | ICD-10-CM

## 2024-06-17 NOTE — Therapy (Unsigned)
 OUTPATIENT PHYSICAL THERAPY TREATMENT  Patient Name: Travis Fitzpatrick MRN: [id number] DOB:1981/01/13, 43 y.o., male Today's Date: 06/17/24   END OF SESSION:  PT End of Session - 06/17/24 1652     Visit Number 91    Number of Visits 108    Date for Recertification  09/07/24    Authorization Type MEDICARE PART B reporting period from 06/15/24    Progress Note Due on Visit 80    PT Start Time 1652    PT Stop Time 1730    PT Time Calculation (min) 38 min    Activity Tolerance Patient limited by pain;Patient tolerated treatment well    Behavior During Therapy [facility] for tasks assessed/performed           Past Medical History:  Diagnosis Date   Bowel trouble    urgency   Medical history non-contributory    Urinary urgency    Past Surgical History:  Procedure Laterality Date   BACK SURGERY  2010   CIRCUMCISION     LUMBAR LAMINECTOMY/DECOMPRESSION MICRODISCECTOMY  07/20/2011   Procedure: LUMBAR LAMINECTOMY/DECOMPRESSION MICRODISCECTOMY;  Surgeon: Travis Fitzpatrick;  Location: [facility];  Service: Neurosurgery;  Laterality: N/A;  right thoracotomy with thoracic eight-nine discectomy and fusion   LUMBAR LAMINECTOMY/DECOMPRESSION MICRODISCECTOMY Right 10/06/2021   Procedure: Right Lumbar Four-Five Microdiscectomy, Right Lumbar Five-Sacal One Foraminotomy;  Surgeon: Fitzpatrick Rockey, MD;  Location: [facility];  Service: Neurosurgery;  Laterality: Right;  3C/RM 21   THORACIC DISCECTOMY  07/16/2012   Procedure: THORACIC DISCECTOMY;  Surgeon: Travis LITTIE Peru, MD;  Location: [facility];  Service: Neurosurgery;  Laterality: Right;  RIGHT Thoracic seven-eight  thoracic diskectomy via thoracotomy by dr Fitzpatrick   THORACIC DISCECTOMY N/A 12/15/2014   Procedure: THORACIC SEVEN TO THORACIC NINE Laminectomy ;  Surgeon: Travis Peru, MD;  Location: [facility];  Service: Neurosurgery;  Laterality: N/A;   THORACIC DISCECTOMY N/A 06/06/2017   Procedure: LAMINECTOMY THORACIC NINE-TEN;  Surgeon: Fitzpatrick Rockey, MD;   Location: [facility];  Service: Neurosurgery;  Laterality: N/A;  LAMINECTOMY THORACIC NINE-TEN   THORACOTOMY  07/20/2011   Procedure: THORACOTOMY OPEN FOR SPINE SURGERY;  Surgeon: Travis Belvie Nam, MD;  Location: [facility];  Service: Vascular;  Laterality: N/A;   THORACOTOMY  07/16/2012   Procedure: THORACOTOMY OPEN FOR SPINE SURGERY;  Surgeon: Travis MARLA Lucas, MD;  Location: [facility];  Service: Thoracic;  Laterality: N/A;   Patient Active Problem List   Diagnosis Date Noted   Carpal tunnel syndrome on right 02/19/2024   Spasticity 09/25/2023   Nerve pain 12/05/2022   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   S/P lumbar spinal fusion 05/25/2022   Synovial cyst of lumbar facet joint 05/25/2022   HNP (herniated nucleus pulposus), lumbar 10/06/2021   Hand pain 06/28/2021   Lumbar facet arthropathy 01/11/2021   Thoracic spondylosis with myelopathy 01/11/2021   Thoracic spinal stenosis 06/06/2017   Stenosis, spinal, thoracic 12/15/2014   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 09/22/2014   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/2011    PCP: Travis Fitzpatrick  REFERRING PROVIDER: Babs Arthea DASEN, MD  REFERRING DIAG: thoracic disc disease with myelopathy, nerve pain, adhesive arachnoiditis  Rationale for Evaluation and Treatment: Rehabilitation  THERAPY DIAG:  Other low back pain  Other symptoms and signs involving the nervous system  Radiculopathy, lumbar region  Muscle weakness (generalized)  Repeated falls  Difficulty in walking, not elsewhere classified  ONSET DATE:  Suddenly started having weakness 11 years ago, most recent  episode of worsening October 2022, s/p right L4-5 laminotomy, microdiscectomy on 10/06/2021, now s/p L4-5 PLIF by Dr. Gillie on 05/25/2022.  PERTINENT HISTORY:  Patient is a 43 y.o. male who presents to outpatient physical therapy with a referral for medical diagnosis thoracic disc disease with myelopathy, nerve pain,  adhesive arachnoiditis. This patient's chief complaints consist of disabling low back and R >L leg pain and weakness with activity, bowel and bladder urgency, incontinence, and retention, leading to the following functional deficits: difficulty with or unable to complete any activity that requires weight bearing, use of B LE, and/or balance including working, household and community mobility, walking, driving, going to family gatherings, avoiding incontinent episodes, playing with daughter, helping around the house, bed mobility, transfers. 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 Gillie on 05/25/2022, adhesive arachnoiditis, nerve pain.  Patient denies hx of cancer, stroke, seizures, lung problems, heart problems, diabetes, unexplained weight loss, and osteoporosis.  SUBJECTIVE:                                                                                                                                                                                           SUBJECTIVE STATEMENT:     Patient arrives with BRW today. He states his muscles were sore and he had increased pain after last PT session, but not so he got stuck in the bed. He states his left leg is feeling better. If he turns certain ways it feels like pinching in his lower back.   PAIN:  NPRS:   3/10 middle lower back, right hip/groin area, and back of bilateral thighs, right anterior lower leg.   PRECAUTIONS: Fall  PATIENT GOALS: To get better, walk, drive again, climb stairs, play with my daughter, help around the  house again  OBJECTIVE   TREATMENT  Therapeutic activities: dynamic therapeutic activities incorporating MULTIPLE parameters or areas of the body designed to achieve improved functional performance.  UE support squats at squat cage (holding on to BB to support bodyweight and offload lumbar spine and LE). Chair placed behind, RW in front, BB at 3rd hole down. CGA from behind with gait belt around waist, ready to keep pt from shifting forwards out of chair if he experiences a sudden back spasm that makes his legs buckle.  6x6  Seated rests of approx 1 min between sets  Perching (glutes propped on elevated plinth with feet on floor, knees below hips) hip flexion with ankle DF with 2# AW draped over top of each foot.  3x10 each side (alternating) R LE feels like it is bothered by pressure from edge of plinth at the back of the right glute. L anterior knee pain also continuing.    Ambulation approx 50 feet to chair outside clinic with BRW and SBA  Therapeutic exercise: therapeutic exercises that incorporate ONE parameter at one or more areas of the body to centralize symptoms, develop strength and endurance, range of motion, and flexibility required for successful completion of functional activities.   Seated quad extension on OMEGA machine, seat position 3 B LE 5x6 at 35# Stopped due to pain in left anterior knee, remains with rest 1 min rests between sets  PATIENT EDUCATION:  Education details: Exercise purpose/form. Self management techniques. POC.  Person educated: patient Education method: Explanation Education comprehension: verbalized understanding and needs further education  HOME EXERCISE PROGRAM: Verbally:  - seated lumbar flexion stretch with head down and one LE extended using rollator, 1x10 each side.  - curl up with legs elevated, 5x15 seconds - sidelying open book 1x10 each side  Access Code: [email or web address] URL: [email or web address] Date:  12/18/2023 Prepared by: Camie Cleverly  Exercises - Seated Diaphragmatic Breathing  - 3 reps - 1 minute practice time - Seated Pelvic Floor Elevators  - 3 x daily - 2 sets - 10 reps - Seated Ankle Plantar Flexion with Resistance Loop  - 1 x daily - 3 sets - 20 reps - Seated Ankle Dorsiflexion AROM  - 1 x daily - 3 sets - 10-30 reps - Seated Ankle Inversion AROM  - 1 x daily - 3 sets - 10-30 reps - Seated Ankle Eversion AROM  - 1 x daily - 3 sets - 10-30 reps - Sidelying Ankle Eversion Strengthening with Ankle Weight  - 1 x daily - 3 sets - 20 reps - Sidelying Ankle Inversion with Ankle Weight  - 1 x daily - 3 sets - 20 reps - Seated Ankle Dorsiflexion with Ankle Weight  - 1 x daily - 3 sets - 20 reps  ASSESSMENT:  CLINICAL IMPRESSION:     Returned to most recent successful training. He was able to return to knee extension machine with increased load and also do higher reps in his sets for squats today. He had less sensation of weakness in the L LE, but was bothered by pain in the left anterior knee that started with knee extension machine and continued afterwards. Patient would benefit from continued management of limiting condition by skilled physical therapist to address remaining impairments and functional limitations to work towards stated goals and return to PLOF or maximal functional independence.    OBJECTIVE IMPAIRMENTS: Abnormal gait, decreased activity tolerance, decreased balance, decreased coordination, decreased endurance, decreased knowledge of condition, decreased knowledge of use of DME, decreased mobility, difficulty walking, decreased ROM, decreased strength, increased edema, impaired perceived functional ability, increased muscle spasms, impaired flexibility, impaired sensation, impaired tone, improper body mechanics, postural dysfunction, obesity, and pain.   ACTIVITY LIMITATIONS: carrying, lifting, bending, standing, squatting, stairs, transfers, bed mobility, continence,  bathing, toileting, dressing, hygiene/grooming, locomotion level, and caring for others  PARTICIPATION LIMITATIONS: meal prep, cleaning, laundry, interpersonal relationship, driving, shopping, community activity, occupation, yard work, and  difficulty with or unable to complete any activity that requires weight bearing, use of B LE, and/or balance  including working, household and community mobility, walking, driving, going to family gatherings, avoiding incontinent episodes, playing with daughter, helping around the house, bed mobility, transfers  PERSONAL FACTORS: Past/current experiences, Time since onset of injury/illness/exacerbation, and 3+ comorbidities:  3 thoracic spine surgeries, thoracic MRI notes 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 Gillie on 05/25/2022, adhesive arachnoiditis, nerve pain are also affecting patient's functional outcome.   REHAB POTENTIAL: Fair due to severity and nature of condition.   CLINICAL DECISION MAKING: Evolving/moderate complexity  EVALUATION COMPLEXITY: Moderate   GOALS: Goals reviewed with patient? No  SHORT TERM GOALS: Target date: 01/09/2023. Target date updated to 05/28/2023 for all unmet goals on 03/05/2023.   Patient will be independent with initial home exercise program for self-management of symptoms. Baseline: Initial HEP to be provided at visit 2 as appropriate (12/26/22); Goal status: MET  LONG TERM GOALS: Target date: 03/20/2023. Target date updated to 08/26/23  for all unmet goals on 06/03/2023. Target date updated to 11/20/2023 for all  unmet goals on 08/28/2023. Target date updated to 02/17/2024  for all unmet goals on  11/25/2023. Target date updated to 04/14/2024 for all unmet goals on 01/22/2024. Updated to 07/13/2024 for all unmet goals on 04/20/2024. Updated to 09/07/2024 for all unmet goals on 06/15/2024.   Patient will be independent with a long-term home exercise program for self-management of symptoms.  Baseline: Initial HEP to be provided at visit 2 as appropriate (12/26/22); participating as able (01/31/2023); patient currently participating as tolerated (03/05/2023; 04/09/2023; 06/03/2023); participates daily in mat exercises and in weight bearing exercises as tolerated (10/08/2023; 11/25/2023); Tries to do daily but sometimes cannot when his body won't let him (01/22/2024); participating as able including activities he finds way to mimic at home that he learns in clinic (03/17/2024); continues to complete exercises as tolerated (04/20/2024); continues to complete HEP as able (06/15/24);  Goal status: In-progress  2.  Patient will demonstrate improved FOTO by equal or greater than 10 points by visit #13 to demonstrate improvement in overall condition and self-reported functional ability.  Baseline: to be tested visit 2 as appropriate (12/26/22); 30 at visit #3 (01/02/2023); 46 at visit #13 (03/05/2023); 45 at visit #29 (06/03/2023); 49 at visit #50 (10/08/2023);  Goal status: MET  3.  Patient will demonstrate the ability to ambulate equal or greater than 600 feet with LRAD during the 6 minute walk to improve his household and community mobility.  Baseline: 34 feet with bari-RW (12/26/22); 200 feet with bari-RW (01/31/2023); 500 feet with BRW (03/05/2023); 400 feet with BRW (04/09/2023); 152 feet with BRW and W/C follow for safety (06/03/2023); 330 feet with BRW (10/08/2023); 245 feet with BRW and SBA for safety (11/25/2023); 343 feet with BRW and SBA for safety (01/22/2024); 340 feet with BRW and SBA for safety (04/20/2024); 412 feet with BRW and W/C follow for safety (06/15/2024);  Goal status: In progress  4.  Patient will complete 5  Time Sit To Stand Test from 19.5 inch surface or lower in equal or less than 15 seconds with no UE support to demonstrate improved transfer ability for improved household and toileting mobility.  Baseline: 23 seconds with heavy B UE support on RW from 19.5 inch plinth. Pain throbbing down  the right LE.  (12/26/22); 19 seconds with heavy B UE support on RW from 19.5 inch plinth (03/05/2023); 20 seconds with heavy B UE support on RRW from 18.5 inch plinth. Painful in back of B calves, thighs, and lower back (04/09/2023); 15 seconds with heavy B UE support on BRW from 18.5 inch plinth. Painful in back of B calves, thighs, and lower back (06/03/2023); 19 seconds with heavy B UE support on BRW from 18.5 inch plinth. Painful in back of B calves, thighs, and lower back (10/08/2023); 18 seconds with heavy B UE support on BRW from 18.5 inch plinth. Painful in back of B calves, thighs, and lower back (11/25/2023); 15seconds with heavy B UE support on BRW from 18.5 inch plinth. Painful in back of B calves, thighs, and lower back (01/22/2024);  14 seconds with heavy B UE support on BRW from 18.5 inch plinth. Painful in back of B calves, thighs, and lower back (04/20/2024); 13 seconds with heavy B UE support on RW and one instance of leaning back of legs on plinth due to LOB backwards (06/15/2024);  Goal status: Ongoing  5.  Patient will report worst pain equal or less than 3/10 with functional activities to improve his ability to complete basic household and community mobility. Baseline: up to 8/10 (12/26/22); reports 3/10 pain (01/31/2023); reports 4/10 pain (02/19/2023); up to 7/10 in the last 2 weeks (03/05/2023): up to  7/10 in the last two weeks (04/09/2023); up to 8/10 in the last 2 weeks (06/03/2023);  up to 9/10 over the last 2 weeks (10/08/2023); up to 7-8/10 in the last 2 weeks (11/25/2023); 7/10 in the last 2 weeks (01/22/2024); better controlled in last 2 weeks but no specific number obtained (03/18/2024); 9/10 with medication  (06/15/2024);  Goal status: Ongoing  6.  Patient will demonstrate improvement in Patient Specific Functional Scale (PSFS) by equal or greater than 3/10 points to reflect clinically significant improvement in patient's most valued functional activities. Baseline: 1.7/10 for Walking Around [address], Enjoying Life, and Driving (97/74/74); 7.2/89 (11/25/2023); 2.7/10 (01/22/24); 2.7/10 (04/20/2024); 2.7/10 (06/15/2024);  Goal status: ongoing  PLAN:  PT FREQUENCY: 1-2x/week PT DURATION: 12 weeks PLANNED INTERVENTIONS: Therapeutic exercises, Therapeutic activity, Neuromuscular re-education, Balance training, Gait training, Patient/Family education, Self Care, Joint mobilization, Stair training, Orthotic/Fit training, DME instructions, Aquatic Therapy, Dry Needling, Electrical stimulation, Wheelchair mobility training, Spinal mobilization, Cryotherapy, Moist heat, Manual therapy, and Re-evaluation.  PLAN FOR NEXT SESSION: Continue with functional and mobility training as tolerated.  update HEP as appropriate, neurodynamics, gait training, LE/core/functional strengthening, stretching, and balance as tolerated, education, manual therapy as needed. Pelvic floor.   Camie SAUNDERS. Juli, PT, DPT, Cert. MDT 06/17/24, 8:29 PM  Addendum to correct omission in guarding documentation during squats Camie R. Juli, PT, DPT, Cert. MDT 06/19/24, 6:10 PM  [facility] [address] [place], [place] [address] P: [phone] LILLETTE FALCON: [phone]   [facility] [address] [place], [place] [address] P: [phone] I F: [phone]

## 2024-06-23 ENCOUNTER — Other Ambulatory Visit: Payer: Self-pay | Admitting: Physical Medicine & Rehabilitation

## 2024-06-23 ENCOUNTER — Ambulatory Visit: Admitting: Physical Therapy

## 2024-06-23 DIAGNOSIS — G039 Meningitis, unspecified: Secondary | ICD-10-CM

## 2024-06-23 DIAGNOSIS — M792 Neuralgia and neuritis, unspecified: Secondary | ICD-10-CM

## 2024-06-23 DIAGNOSIS — M5104 Intervertebral disc disorders with myelopathy, thoracic region: Secondary | ICD-10-CM

## 2024-06-25 ENCOUNTER — Ambulatory Visit: Admitting: Physical Therapy

## 2024-06-25 ENCOUNTER — Other Ambulatory Visit: Payer: Self-pay | Admitting: Physical Medicine & Rehabilitation

## 2024-06-25 DIAGNOSIS — G039 Meningitis, unspecified: Secondary | ICD-10-CM

## 2024-06-25 DIAGNOSIS — M5104 Intervertebral disc disorders with myelopathy, thoracic region: Secondary | ICD-10-CM

## 2024-06-25 DIAGNOSIS — M792 Neuralgia and neuritis, unspecified: Secondary | ICD-10-CM

## 2024-06-25 NOTE — Telephone Encounter (Signed)
 Please advise if okay to refill.

## 2024-06-30 ENCOUNTER — Ambulatory Visit: Admitting: Physical Therapy

## 2024-07-02 ENCOUNTER — Ambulatory Visit: Admitting: Physical Therapy

## 2024-07-02 LAB — GENECONNECT MOLECULAR SCREEN: Genetic Analysis Overall Interpretation: NEGATIVE

## 2024-07-07 ENCOUNTER — Other Ambulatory Visit: Payer: Self-pay | Admitting: Physical Medicine & Rehabilitation

## 2024-07-07 ENCOUNTER — Encounter: Payer: Self-pay | Admitting: Physical Therapy

## 2024-07-07 ENCOUNTER — Ambulatory Visit: Admitting: Physical Therapy

## 2024-07-07 DIAGNOSIS — M5459 Other low back pain: Secondary | ICD-10-CM | POA: Diagnosis not present

## 2024-07-07 DIAGNOSIS — R262 Difficulty in walking, not elsewhere classified: Secondary | ICD-10-CM

## 2024-07-07 DIAGNOSIS — R296 Repeated falls: Secondary | ICD-10-CM

## 2024-07-07 DIAGNOSIS — M6281 Muscle weakness (generalized): Secondary | ICD-10-CM

## 2024-07-07 DIAGNOSIS — M5416 Radiculopathy, lumbar region: Secondary | ICD-10-CM

## 2024-07-07 DIAGNOSIS — R252 Cramp and spasm: Secondary | ICD-10-CM

## 2024-07-07 DIAGNOSIS — R29818 Other symptoms and signs involving the nervous system: Secondary | ICD-10-CM

## 2024-07-07 NOTE — Therapy (Signed)
 OUTPATIENT PHYSICAL THERAPY TREATMENT  Patient Name: Travis Fitzpatrick MRN: [id number] DOB:11-Aug-1981, 43 y.o., male Today's Date: 07/07/24   END OF SESSION:  PT End of Session - 07/07/24 1611     Visit Number 92    Number of Visits 108    Date for Recertification  09/07/24    Authorization Type MEDICARE PART B reporting period from 06/15/24    Progress Note Due on Visit 80    PT Start Time 1605    PT Stop Time 1643    PT Time Calculation (min) 38 min    Equipment Utilized During Treatment Gait belt    Activity Tolerance Patient limited by pain;Patient tolerated treatment well    Behavior During Therapy [facility] for tasks assessed/performed            Past Medical History:  Diagnosis Date   Bowel trouble    urgency   Medical history non-contributory    Urinary urgency    Past Surgical History:  Procedure Laterality Date   BACK SURGERY  2010   CIRCUMCISION     LUMBAR LAMINECTOMY/DECOMPRESSION MICRODISCECTOMY  07/20/2011   Procedure: LUMBAR LAMINECTOMY/DECOMPRESSION MICRODISCECTOMY;  Surgeon: Rockey LITTIE Peru;  Location: [facility];  Service: Neurosurgery;  Laterality: N/A;  right thoracotomy with thoracic eight-nine discectomy and fusion   LUMBAR LAMINECTOMY/DECOMPRESSION MICRODISCECTOMY Right 10/06/2021   Procedure: Right Lumbar Four-Five Microdiscectomy, Right Lumbar Five-Sacal One Foraminotomy;  Surgeon: Peru Rockey, MD;  Location: [facility];  Service: Neurosurgery;  Laterality: Right;  3C/RM 21   THORACIC DISCECTOMY  07/16/2012   Procedure: THORACIC DISCECTOMY;  Surgeon: Rockey LITTIE Peru, MD;  Location: [facility];  Service: Neurosurgery;  Laterality: Right;  RIGHT Thoracic seven-eight  thoracic diskectomy via thoracotomy by dr lucas   THORACIC DISCECTOMY N/A 12/15/2014   Procedure: THORACIC SEVEN TO THORACIC NINE Laminectomy ;  Surgeon: Rockey Peru, MD;  Location: [facility];  Service: Neurosurgery;  Laterality: N/A;   THORACIC DISCECTOMY N/A 06/06/2017   Procedure:  LAMINECTOMY THORACIC NINE-TEN;  Surgeon: [place] Rockey, MD;  Location: [facility];  Service: Neurosurgery;  Laterality: N/A;  LAMINECTOMY THORACIC NINE-TEN   THORACOTOMY  07/20/2011   Procedure: THORACOTOMY OPEN FOR SPINE SURGERY;  Surgeon: JONETTA Belvie Nam, MD;  Location: [facility];  Service: Vascular;  Laterality: N/A;   THORACOTOMY  07/16/2012   Procedure: THORACOTOMY OPEN FOR SPINE SURGERY;  Surgeon: Dorise MARLA Lucas, MD;  Location: [facility];  Service: Thoracic;  Laterality: N/A;   Patient Active Problem List   Diagnosis Date Noted   Carpal tunnel syndrome on right 02/19/2024   Spasticity 09/25/2023   Nerve pain 12/05/2022   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   S/P lumbar spinal fusion 05/25/2022   Synovial cyst of lumbar facet joint 05/25/2022   HNP (herniated nucleus pulposus), lumbar 10/06/2021   Hand pain 06/28/2021   Lumbar facet arthropathy 01/11/2021   Thoracic spondylosis with myelopathy 01/11/2021   Thoracic spinal stenosis 06/06/2017   Stenosis, spinal, thoracic 12/15/2014   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 09/22/2014   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/2011    PCP: Maryl Sayres  REFERRING PROVIDER: Babs Arthea DASEN, MD  REFERRING DIAG: thoracic disc disease with myelopathy, nerve pain, adhesive arachnoiditis  Rationale for Evaluation and Treatment: Rehabilitation  THERAPY DIAG:  Other low back pain  Other symptoms and signs involving the nervous system  Radiculopathy, lumbar region  Muscle weakness (generalized)  Repeated falls  Difficulty in walking, not elsewhere classified  ONSET DATE:  Suddenly started having weakness 11 years ago, most recent episode of worsening October 2022, s/p right L4-5 laminotomy, microdiscectomy on 10/06/2021, now s/p L4-5 PLIF by Dr. Gillie on 05/25/2022.  PERTINENT HISTORY:  Patient is a 43 y.o. male who presents to outpatient physical therapy with a referral for medical  diagnosis thoracic disc disease with myelopathy, nerve pain, adhesive arachnoiditis. This patient's chief complaints consist of disabling low back and R >L leg pain and weakness with activity, bowel and bladder urgency, incontinence, and retention, leading to the following functional deficits: difficulty with or unable to complete any activity that requires weight bearing, use of B LE, and/or balance including working, household and community mobility, walking, driving, going to family gatherings, avoiding incontinent episodes, playing with daughter, helping around the house, bed mobility, transfers. 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 Gillie on 05/25/2022, adhesive arachnoiditis, nerve pain.  Patient denies hx of cancer, stroke, seizures, lung problems, heart problems, diabetes, unexplained weight loss, and osteoporosis.  SUBJECTIVE:                                                                                                                                                                                           SUBJECTIVE STATEMENT:     Patient arrives with BRW today. He states he has not been doing very well since he was unable to afford some of his medications and so had a lot of pain and disability, preventing him from getting to PT. He has been able to re-start them and is feeling better, although it was hard for him to get to PT today.   PAIN:  NPRS:   3/10 middle lower back, right hip/groin area, and back of bilateral thighs, right anterior lower leg.   PRECAUTIONS:  Fall  PATIENT GOALS: To get better, walk, drive again, climb stairs, play with my daughter, help around the house again  OBJECTIVE   TREATMENT  Therapeutic activities: dynamic therapeutic activities incorporating MULTIPLE parameters or areas of the body designed to achieve improved functional performance.  UE support squats at squat cage (holding on to BB to support bodyweight and offload lumbar spine and LE). Chair placed behind, RW in front, BB at 3rd hole down. CGA from behind with gait belt around waist, ready to keep pt  from shifting forwards out of chair if he experiences a sudden back spasm that makes his legs buckle.  4x8 Seated rests of approx 1 min between sets  Seated alternating dorsiflexion to improve ability to keep toe up when taking steps during walking 1x20 each side   Ambulation approx 50 feet to chair outside clinic with BRW and SBA  Therapeutic exercise: therapeutic exercises that incorporate ONE parameter at one or more areas of the body to centralize symptoms, develop strength and endurance, range of motion, and flexibility required for successful completion of functional activities.   Seated quad extension on OMEGA machine, seat position 3 B LE 4x7 at 35# 1 min rests between sets  Superset:  Seated overhead pull down (B shoulder extension from ~120 degrees flexion to bar on thighs) with cable bar to improve core strength 1x20 at 25#  1x20 at 35# 1x20 at 17#   Seated CHEST press to improve trunk extensor strength  1x15 with 23# Barbell Increasing R LE symptoms, so discontinued after 1 set  Seated lumbar flexion roll out with theraball 1x 2 min per pt preference following second set of overhead pull down  PATIENT EDUCATION:  Education details: Exercise purpose/form. Self management techniques. POC.  Person educated: patient Education method: Explanation Education comprehension: verbalized understanding and needs further education  HOME EXERCISE  PROGRAM: Verbally:  - seated lumbar flexion stretch with head down and one LE extended using rollator, 1x10 each side.  - curl up with legs elevated, 5x15 seconds - sidelying open book 1x10 each side  Access Code: [email or web address] URL: [email or web address] Date: 12/18/2023 Prepared by: Camie Cleverly  Exercises - Seated Diaphragmatic Breathing  - 3 reps - 1 minute practice time - Seated Pelvic Floor Elevators  - 3 x daily - 2 sets - 10 reps - Seated Ankle Plantar Flexion with Resistance Loop  - 1 x daily - 3 sets - 20 reps - Seated Ankle Dorsiflexion AROM  - 1 x daily - 3 sets - 10-30 reps - Seated Ankle Inversion AROM  - 1 x daily - 3 sets - 10-30 reps - Seated Ankle Eversion AROM  - 1 x daily - 3 sets - 10-30 reps - Sidelying Ankle Eversion Strengthening with Ankle Weight  - 1 x daily - 3 sets - 20 reps - Sidelying Ankle Inversion with Ankle Weight  - 1 x daily - 3 sets - 20 reps - Seated Ankle Dorsiflexion with Ankle Weight  - 1 x daily - 3 sets - 20 reps  ASSESSMENT:  CLINICAL IMPRESSION:     Patient returned to PT after nearly 3 weeks away. He was unable to attend due to exacerbation of symptoms and decreased function when he was unable to take medications due to financial limitations. Today he was able to return to previous exercise with ability to increased reps, so sets were decreased to keep similar volume and decrease likelihood of severe DOMS. Also returned to some core specific exercises, which he was better at than last time. He was unable to tolerate trunk extensor strengthening so this was discontinued. He had usual increased pain in the right LE by end of session and had limitations from back spasms during session. He continues to have limited control of his ankles and legs and is at high risk of falling. Continued PT is medically necessary to prevent decline in function and loss of independence. Patient would benefit from continued management of limiting condition by  skilled physical therapist to address remaining impairments and functional  limitations to work towards stated goals and return to PLOF or maximal functional independence.    OBJECTIVE IMPAIRMENTS: Abnormal gait, decreased activity tolerance, decreased balance, decreased coordination, decreased endurance, decreased knowledge of condition, decreased knowledge of use of DME, decreased mobility, difficulty walking, decreased ROM, decreased strength, increased edema, impaired perceived functional ability, increased muscle spasms, impaired flexibility, impaired sensation, impaired tone, improper body mechanics, postural dysfunction, obesity, and pain.   ACTIVITY LIMITATIONS: carrying, lifting, bending, standing, squatting, stairs, transfers, bed mobility, continence, bathing, toileting, dressing, hygiene/grooming, locomotion level, and caring for others  PARTICIPATION LIMITATIONS: meal prep, cleaning, laundry, interpersonal relationship, driving, shopping, community activity, occupation, yard work, and  difficulty with or unable to complete any activity that requires weight bearing, use of B LE, and/or balance including working, household and community mobility, walking, driving, going to family gatherings, avoiding incontinent episodes, playing with daughter, helping around the house, bed mobility, transfers  PERSONAL FACTORS: Past/current experiences, Time since onset of injury/illness/exacerbation, and 3+ comorbidities:  3 thoracic spine surgeries, thoracic MRI notes 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 Gillie on 05/25/2022, adhesive arachnoiditis, nerve pain are also affecting patient's  functional outcome.   REHAB POTENTIAL: Fair due to severity and nature of condition.   CLINICAL DECISION MAKING: Evolving/moderate complexity  EVALUATION COMPLEXITY: Moderate   GOALS: Goals reviewed with patient? No  SHORT TERM GOALS: Target date: 01/09/2023. Target date updated to 05/28/2023 for all unmet goals on 03/05/2023.   Patient will be independent with initial home exercise program for self-management of symptoms. Baseline: Initial HEP to be provided at visit 2 as appropriate (12/26/22); Goal status: MET  LONG TERM GOALS: Target date: 03/20/2023. Target date updated to 08/26/23  for all unmet goals on 06/03/2023. Target date updated to 11/20/2023 for all  unmet goals on 08/28/2023. Target date updated to 02/17/2024  for all unmet goals on 11/25/2023. Target date updated to 04/14/2024 for all unmet goals on 01/22/2024. Updated to 07/13/2024 for all unmet goals on 04/20/2024. Updated to 09/07/2024 for all unmet goals on 06/15/2024.   Patient will be independent with a long-term home exercise program for self-management of symptoms.  Baseline: Initial HEP to be provided at visit 2 as appropriate (12/26/22); participating as able (01/31/2023); patient currently participating as tolerated (03/05/2023; 04/09/2023; 06/03/2023); participates daily in mat exercises and in weight bearing exercises as tolerated (10/08/2023; 11/25/2023); Tries to do daily but sometimes cannot when his body won't let him (01/22/2024); participating as able including activities he finds way to mimic at home that he learns in clinic (03/17/2024); continues to complete exercises as tolerated (04/20/2024); continues to complete HEP as able (06/15/24);  Goal status: In-progress  2.  Patient will demonstrate improved FOTO by equal or greater than 10 points by visit #13 to demonstrate improvement in overall condition and self-reported functional ability.  Baseline: to be tested visit 2 as appropriate (12/26/22); 30 at visit #3 (01/02/2023); 46 at  visit #13 (03/05/2023); 45 at visit #29 (06/03/2023); 49 at visit #50 (10/08/2023);  Goal status: MET  3.  Patient will demonstrate the ability to ambulate equal or greater than 600 feet with LRAD during the 6 minute walk to improve his household and community mobility.  Baseline: 34 feet with bari-RW (  12/26/22); 200 feet with bari-RW (01/31/2023); 500 feet with BRW (03/05/2023); 400 feet with BRW (04/09/2023); 152 feet with BRW and W/C follow for safety (06/03/2023); 330 feet with BRW (10/08/2023); 245 feet with BRW and SBA for safety (11/25/2023); 343 feet with BRW and SBA for safety (01/22/2024); 340 feet with BRW and SBA for safety (04/20/2024); 412 feet with BRW and W/C follow for safety (06/15/2024);  Goal status: In progress  4.  Patient will complete 5 Time Sit To Stand Test from 19.5 inch surface or lower in equal or less than 15 seconds with no UE support to demonstrate improved transfer ability for improved household and toileting mobility.  Baseline: 23 seconds with heavy B UE support on RW from 19.5 inch plinth. Pain throbbing down the right LE.  (12/26/22); 19 seconds with heavy B UE support on RW from 19.5 inch plinth (03/05/2023); 20 seconds with heavy B UE support on RRW from 18.5 inch plinth. Painful in back of B calves, thighs, and lower back (04/09/2023); 15 seconds with heavy B UE support on BRW from 18.5 inch plinth. Painful in back of B calves, thighs, and lower back (06/03/2023); 19 seconds with heavy B UE support on BRW from 18.5 inch plinth. Painful in back of B calves, thighs, and lower back (10/08/2023); 18 seconds with heavy B UE support on BRW from 18.5 inch plinth. Painful in back of B calves, thighs, and lower back (11/25/2023); 15seconds with heavy B UE support on BRW from 18.5 inch plinth. Painful in back of B calves, thighs, and lower back (01/22/2024);  14 seconds with heavy B UE support on BRW from 18.5 inch plinth. Painful in back of B calves, thighs, and lower back (04/20/2024); 13  seconds with heavy B UE support on RW and one instance of leaning back of legs on plinth due to LOB backwards (06/15/2024);  Goal status: Ongoing  5.  Patient will report worst pain equal or less than 3/10 with functional activities to improve his ability to complete basic household and community mobility. Baseline: up to 8/10 (12/26/22); reports 3/10 pain (01/31/2023); reports 4/10 pain (02/19/2023); up to 7/10 in the last 2 weeks (03/05/2023): up to  7/10 in the last two weeks (04/09/2023); up to 8/10 in the last 2 weeks (06/03/2023);  up to 9/10 over the last 2 weeks (10/08/2023); up to 7-8/10 in the last 2 weeks (11/25/2023); 7/10 in the last 2 weeks (01/22/2024); better controlled in last 2 weeks but no specific number obtained (03/18/2024); 9/10 with medication (06/15/2024);  Goal status: Ongoing  6.  Patient will demonstrate improvement in Patient Specific Functional Scale (PSFS) by equal or greater than 3/10 points to reflect clinically significant improvement in patient's most valued functional activities. Baseline: 1.7/10 for Walking Around [address], Enjoying Life, and Driving (97/74/74); 7.2/89 (11/25/2023); 2.7/10 (01/22/24); 2.7/10 (04/20/2024); 2.7/10 (06/15/2024);  Goal status: ongoing  PLAN:  PT FREQUENCY: 1-2x/week PT DURATION: 12 weeks PLANNED INTERVENTIONS: Therapeutic exercises, Therapeutic activity, Neuromuscular re-education, Balance training, Gait training, Patient/Family education, Self Care, Joint mobilization, Stair training, Orthotic/Fit training, DME instructions, Aquatic Therapy, Dry Needling, Electrical stimulation, Wheelchair mobility training, Spinal mobilization, Cryotherapy, Moist heat, Manual therapy, and Re-evaluation.  PLAN FOR NEXT SESSION: Continue with functional and mobility training as tolerated.  update HEP as appropriate, neurodynamics, gait training, LE/core/functional strengthening, stretching, and balance as tolerated, education, manual therapy as needed. Pelvic floor.    Camie SAUNDERS. Juli, PT, DPT, Cert. MDT 07/07/24, 6:16 PM  [facility] [id number] [place], [place] [address] P: [phone] I F: [phone]

## 2024-07-08 ENCOUNTER — Encounter: Payer: Self-pay | Admitting: *Deleted

## 2024-07-08 DIAGNOSIS — R252 Cramp and spasm: Secondary | ICD-10-CM

## 2024-07-08 MED ORDER — BACLOFEN 10 MG PO TABS: 10.0000 mg | ORAL_TABLET | Freq: Two times a day (BID) | ORAL | 3 refills | Status: AC

## 2024-07-13 ENCOUNTER — Encounter: Payer: Self-pay | Admitting: Physical Therapy

## 2024-07-13 ENCOUNTER — Other Ambulatory Visit: Payer: Self-pay | Admitting: Physical Medicine & Rehabilitation

## 2024-07-13 ENCOUNTER — Ambulatory Visit: Admitting: Physical Therapy

## 2024-07-13 DIAGNOSIS — R262 Difficulty in walking, not elsewhere classified: Secondary | ICD-10-CM | POA: Diagnosis present

## 2024-07-13 DIAGNOSIS — M5459 Other low back pain: Secondary | ICD-10-CM | POA: Insufficient documentation

## 2024-07-13 DIAGNOSIS — M6281 Muscle weakness (generalized): Secondary | ICD-10-CM | POA: Diagnosis present

## 2024-07-13 DIAGNOSIS — R29818 Other symptoms and signs involving the nervous system: Secondary | ICD-10-CM | POA: Insufficient documentation

## 2024-07-13 DIAGNOSIS — R296 Repeated falls: Secondary | ICD-10-CM | POA: Diagnosis present

## 2024-07-13 DIAGNOSIS — M5416 Radiculopathy, lumbar region: Secondary | ICD-10-CM | POA: Insufficient documentation

## 2024-07-13 NOTE — Therapy (Unsigned)
 OUTPATIENT PHYSICAL THERAPY TREATMENT  Patient Name: Travis Fitzpatrick MRN: [id number] DOB:1981/08/09, 43 y.o., male Today's Date: 07/13/24   END OF SESSION:  PT End of Session - 07/13/24 1740     Visit Number 93    Number of Visits 108    Date for Recertification  09/07/24    Authorization Type MEDICARE PART B reporting period from 06/15/24    Progress Note Due on Visit 80    PT Start Time 1736    PT Stop Time 1816    PT Time Calculation (min) 40 min    Equipment Utilized During Treatment Gait belt    Activity Tolerance Patient limited by pain;Patient tolerated treatment well    Behavior During Therapy [facility] for tasks assessed/performed             Past Medical History:  Diagnosis Date   Bowel trouble    urgency   Medical history non-contributory    Urinary urgency    Past Surgical History:  Procedure Laterality Date   BACK SURGERY  2010   CIRCUMCISION     LUMBAR LAMINECTOMY/DECOMPRESSION MICRODISCECTOMY  07/20/2011   Procedure: LUMBAR LAMINECTOMY/DECOMPRESSION MICRODISCECTOMY;  Surgeon: Rockey LITTIE Peru;  Location: [facility];  Service: Neurosurgery;  Laterality: N/A;  right thoracotomy with thoracic eight-nine discectomy and fusion   LUMBAR LAMINECTOMY/DECOMPRESSION MICRODISCECTOMY Right 10/06/2021   Procedure: Right Lumbar Four-Five Microdiscectomy, Right Lumbar Five-Sacal One Foraminotomy;  Surgeon: Peru Rockey, MD;  Location: [facility];  Service: Neurosurgery;  Laterality: Right;  3C/RM 21   THORACIC DISCECTOMY  07/16/2012   Procedure: THORACIC DISCECTOMY;  Surgeon: Rockey LITTIE Peru, MD;  Location: [facility];  Service: Neurosurgery;  Laterality: Right;  RIGHT Thoracic seven-eight  thoracic diskectomy via thoracotomy by dr lucas   THORACIC DISCECTOMY N/A 12/15/2014   Procedure: THORACIC SEVEN TO THORACIC NINE Laminectomy ;  Surgeon: Rockey Peru, MD;  Location: [facility];  Service: Neurosurgery;  Laterality: N/A;   THORACIC DISCECTOMY N/A 06/06/2017   Procedure:  LAMINECTOMY THORACIC NINE-TEN;  Surgeon: Peru Rockey, MD;  Location: [facility];  Service: Neurosurgery;  Laterality: N/A;  LAMINECTOMY THORACIC NINE-TEN   THORACOTOMY  07/20/2011   Procedure: THORACOTOMY OPEN FOR SPINE SURGERY;  Surgeon: JONETTA Belvie Nam, MD;  Location: [facility];  Service: Vascular;  Laterality: N/A;   THORACOTOMY  07/16/2012   Procedure: THORACOTOMY OPEN FOR SPINE SURGERY;  Surgeon: Dorise MARLA Lucas, MD;  Location: [facility];  Service: Thoracic;  Laterality: N/A;   Patient Active Problem List   Diagnosis Date Noted   Carpal tunnel syndrome on right 02/19/2024   Spasticity 09/25/2023   Nerve pain 12/05/2022   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   S/P lumbar spinal fusion 05/25/2022   Synovial cyst of lumbar facet joint 05/25/2022   HNP (herniated nucleus pulposus), lumbar 10/06/2021   Hand pain 06/28/2021   Lumbar facet arthropathy 01/11/2021   Thoracic spondylosis with myelopathy 01/11/2021   Thoracic spinal stenosis 06/06/2017   Stenosis, spinal, thoracic 12/15/2014   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  ONSET DATE:  Suddenly started having weakness 11 years ago, most recent episode of worsening October 2022, s/p right L4-5 laminotomy, microdiscectomy on 10/06/2021, now s/p L4-5 PLIF by  Dr. Gillie on 05/25/2022.  PERTINENT HISTORY:  Patient is a 43 y.o. male who presents to outpatient physical therapy with a referral for medical diagnosis thoracic disc disease with myelopathy, nerve pain, adhesive arachnoiditis. This patient's chief complaints consist of disabling low back and R >L leg pain and weakness with activity,  bowel and bladder urgency, incontinence, and retention, leading to the following functional deficits: difficulty with or unable to complete any activity that requires weight bearing, use of B LE, and/or balance including working, household and community mobility, walking, driving, going to family gatherings, avoiding incontinent episodes, playing with daughter, helping around the house, bed mobility, transfers. 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 Gillie on 05/25/2022, adhesive arachnoiditis, nerve pain.  Patient denies hx of cancer, stroke, seizures, lung problems, heart problems, diabetes, unexplained weight loss, and osteoporosis.  SUBJECTIVE:                                                                                                                                                                                           SUBJECTIVE STATEMENT:     Patient arrives with BRW today. He states his body is working against him but he is doing okay. He states he did pretty good after last PT session. HE was sore but not to the point to where it kept him in the bed or kept him from doing things around the house. He fell this morning when reaching for his walker while getting out of bed and his legs just didn't hold him. He thinks it is the weather and his problems get worse this time of year. He has not been driving lately because his legs are not wanting to work with him right now, but he has plans to start back.   PAIN:  NPRS:   3/10 middle lower back, right hip/groin area, and back  of bilateral thighs, right anterior lower leg.   PRECAUTIONS: Fall  PATIENT GOALS: To get better, walk, drive again, climb stairs, play with my daughter, help around the house again  OBJECTIVE  TREATMENT  Therapeutic activities: dynamic therapeutic activities incorporating MULTIPLE parameters or areas of the body designed to achieve improved functional performance.  UE support squats at squat cage (holding on to BB to support bodyweight and offload lumbar  spine and LE). Chair placed behind, RW in front, BB at 3rd hole down. CGA from behind with gait belt around waist, ready to keep pt from shifting forwards out of chair if he experiences a sudden back spasm that makes his legs buckle.  4x9 Seated rests of approx 1 min between sets  Seated alternating dorsiflexion to improve ability to keep toe up when taking steps during walking 2x20 each side   Ambulation approx 50 feet to chair outside clinic with BRW and SBA  Therapeutic exercise: therapeutic exercises that incorporate ONE parameter at one or more areas of the body to centralize symptoms, develop strength and endurance, range of motion, and flexibility required for successful completion of functional activities.   Seated quad extension on OMEGA machine, seat position 3 B LE 5x7 at 35# 1 min rests between sets  Superset:  Seated overhead pull down (B shoulder extension from ~120 degrees flexion to bar on thighs) with cable bar to improve core strength 2x20 at 35# 1x20 at 30# 1 minute rest between  PATIENT EDUCATION:  Education details: Exercise purpose/form. Self management techniques. POC.  Person educated: patient Education method: Explanation Education comprehension: verbalized understanding and needs further education  HOME EXERCISE PROGRAM: Verbally:  - seated lumbar flexion stretch with head down and one LE extended using rollator, 1x10 each side.  - curl up with legs elevated, 5x15 seconds - sidelying open book  1x10 each side  Access Code: [email or web address] URL: [email or web address] Date: 12/18/2023 Prepared by: Camie Cleverly  Exercises - Seated Diaphragmatic Breathing  - 3 reps - 1 minute practice time - Seated Pelvic Floor Elevators  - 3 x daily - 2 sets - 10 reps - Seated Ankle Plantar Flexion with Resistance Loop  - 1 x daily - 3 sets - 20 reps - Seated Ankle Dorsiflexion AROM  - 1 x daily - 3 sets - 10-30 reps - Seated Ankle Inversion AROM  - 1 x daily - 3 sets - 10-30 reps - Seated Ankle Eversion AROM  - 1 x daily - 3 sets - 10-30 reps - Sidelying Ankle Eversion Strengthening with Ankle Weight  - 1 x daily - 3 sets - 20 reps - Sidelying Ankle Inversion with Ankle Weight  - 1 x daily - 3 sets - 20 reps - Seated Ankle Dorsiflexion with Ankle Weight  - 1 x daily - 3 sets - 20 reps  ASSESSMENT:  CLINICAL IMPRESSION:     ***   OBJECTIVE IMPAIRMENTS: Abnormal gait, decreased activity tolerance, decreased balance, decreased coordination, decreased endurance, decreased knowledge of condition, decreased knowledge of use of DME, decreased mobility, difficulty walking, decreased ROM, decreased strength, increased edema, impaired perceived functional ability, increased muscle spasms, impaired flexibility, impaired sensation, impaired tone, improper body mechanics, postural dysfunction, obesity, and pain.   ACTIVITY LIMITATIONS: carrying, lifting, bending, standing, squatting, stairs, transfers, bed mobility, continence, bathing, toileting, dressing, hygiene/grooming, locomotion level, and caring for others  PARTICIPATION LIMITATIONS: meal prep, cleaning, laundry, interpersonal relationship, driving, shopping, community activity, occupation, yard work, and  difficulty with or unable to complete any activity that requires weight bearing, use of B LE, and/or balance including working, household and community mobility, walking, driving, going to family gatherings, avoiding incontinent episodes,  playing with daughter, helping around the house, bed mobility, transfers  PERSONAL FACTORS: Past/current experiences, Time since onset of injury/illness/exacerbation, and 3+ comorbidities:  3 thoracic spine surgeries, thoracic MRI notes 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 Gillie on 05/25/2022, adhesive arachnoiditis, nerve pain are also affecting patient's functional outcome.   REHAB POTENTIAL: Fair due to severity and nature of condition.   CLINICAL DECISION MAKING: Evolving/moderate complexity  EVALUATION COMPLEXITY: Moderate   GOALS: Goals reviewed with patient? No  SHORT TERM GOALS: Target date: 01/09/2023. Target date updated to 05/28/2023 for all unmet goals on 03/05/2023.   Patient will be independent with initial home exercise program for self-management of symptoms. Baseline: Initial HEP to be provided at visit 2 as appropriate (12/26/22); Goal status: MET  LONG TERM GOALS: Target date: 03/20/2023. Target date updated to 08/26/23  for all unmet goals on 06/03/2023. Target date updated to 11/20/2023 for all  unmet goals on 08/28/2023. Target date updated to 02/17/2024  for all unmet goals on 11/25/2023. Target date updated to 04/14/2024 for all unmet goals on 01/22/2024. Updated to 07/13/2024 for all unmet goals on 04/20/2024. Updated to 09/07/2024 for all unmet goals on 06/15/2024.   Patient will be independent with a long-term home exercise program for self-management of symptoms.  Baseline: Initial HEP to be provided at visit 2 as appropriate (12/26/22); participating as able (01/31/2023); patient currently participating as tolerated (03/05/2023; 04/09/2023; 06/03/2023);  participates daily in mat exercises and in weight bearing exercises as tolerated (10/08/2023; 11/25/2023); Tries to do daily but sometimes cannot when his body won't let him (01/22/2024); participating as able including activities he finds way to mimic at home that he learns in clinic (03/17/2024); continues to complete exercises as tolerated (04/20/2024); continues to complete HEP as able (06/15/24);  Goal status: In-progress  2.  Patient will demonstrate improved FOTO by equal or greater than 10 points by visit #13 to demonstrate improvement in overall condition and self-reported functional ability.  Baseline: to be tested visit 2 as appropriate (12/26/22); 30 at visit #3 (01/02/2023); 46 at visit #13 (03/05/2023); 45 at visit #29 (06/03/2023); 49 at visit #50 (10/08/2023);  Goal status: MET  3.  Patient will demonstrate the ability to ambulate equal or greater than 600 feet with LRAD during the 6 minute walk to improve his household and community mobility.  Baseline: 34 feet with bari-RW (12/26/22); 200 feet with bari-RW (01/31/2023); 500 feet with BRW (03/05/2023); 400 feet with BRW (04/09/2023); 152 feet with BRW and W/C follow for safety (06/03/2023); 330 feet with BRW (10/08/2023); 245 feet with BRW and SBA for safety (11/25/2023); 343 feet with BRW and SBA for safety (01/22/2024); 340 feet with BRW and SBA for safety (04/20/2024); 412 feet with BRW and W/C follow for safety (06/15/2024);  Goal status: In progress  4.  Patient will complete 5 Time Sit To Stand Test from 19.5 inch surface or lower in equal or less than 15 seconds with no UE support to demonstrate improved transfer ability for improved household and toileting mobility.  Baseline: 23 seconds with heavy B UE support on RW from 19.5 inch plinth. Pain throbbing down the right LE.  (12/26/22); 19 seconds with heavy B UE support on RW from 19.5 inch plinth (03/05/2023); 20 seconds with heavy B UE support on RRW from 18.5 inch plinth. Painful in back of B  calves, thighs, and lower back (04/09/2023); 15 seconds with heavy B UE support on BRW from 18.5 inch plinth. Painful in back of B calves,  thighs, and lower back (06/03/2023); 19 seconds with heavy B UE support on BRW from 18.5 inch plinth. Painful in back of B calves, thighs, and lower back (10/08/2023); 18 seconds with heavy B UE support on BRW from 18.5 inch plinth. Painful in back of B calves, thighs, and lower back (11/25/2023); 15seconds with heavy B UE support on BRW from 18.5 inch plinth. Painful in back of B calves, thighs, and lower back (01/22/2024);  14 seconds with heavy B UE support on BRW from 18.5 inch plinth. Painful in back of B calves, thighs, and lower back (04/20/2024); 13 seconds with heavy B UE support on RW and one instance of leaning back of legs on plinth due to LOB backwards (06/15/2024);  Goal status: Ongoing  5.  Patient will report worst pain equal or less than 3/10 with functional activities to improve his ability to complete basic household and community mobility. Baseline: up to 8/10 (12/26/22); reports 3/10 pain (01/31/2023); reports 4/10 pain (02/19/2023); up to 7/10 in the last 2 weeks (03/05/2023): up to  7/10 in the last two weeks (04/09/2023); up to 8/10 in the last 2 weeks (06/03/2023);  up to 9/10 over the last 2 weeks (10/08/2023); up to 7-8/10 in the last 2 weeks (11/25/2023); 7/10 in the last 2 weeks (01/22/2024); better controlled in last 2 weeks but no specific number obtained (03/18/2024); 9/10 with medication (06/15/2024);  Goal status: Ongoing  6.  Patient will demonstrate improvement in Patient Specific Functional Scale (PSFS) by equal or greater than 3/10 points to reflect clinically significant improvement in patient's most valued functional activities. Baseline: 1.7/10 for Walking Around [address], Enjoying Life, and Driving (97/74/74); 7.2/89 (11/25/2023); 2.7/10 (01/22/24); 2.7/10 (04/20/2024); 2.7/10 (06/15/2024);  Goal status: ongoing  PLAN:  PT FREQUENCY: 1-2x/week PT  DURATION: 12 weeks PLANNED INTERVENTIONS: Therapeutic exercises, Therapeutic activity, Neuromuscular re-education, Balance training, Gait training, Patient/Family education, Self Care, Joint mobilization, Stair training, Orthotic/Fit training, DME instructions, Aquatic Therapy, Dry Needling, Electrical stimulation, Wheelchair mobility training, Spinal mobilization, Cryotherapy, Moist heat, Manual therapy, and Re-evaluation.  PLAN FOR NEXT SESSION: Continue with functional and mobility training as tolerated.  update HEP as appropriate, neurodynamics, gait training, LE/core/functional strengthening, stretching, and balance as tolerated, education, manual therapy as needed. Pelvic floor.   Camie SAUNDERS. Juli, PT, DPT, Cert. MDT 07/13/24, 6:20 PM  [facility] [address] [place], [place] [address] P: [phone] I F: [phone]

## 2024-07-15 ENCOUNTER — Ambulatory Visit: Admitting: Physical Therapy

## 2024-07-22 ENCOUNTER — Ambulatory Visit: Admitting: Physical Therapy

## 2024-07-23 NOTE — Telephone Encounter (Signed)
 error

## 2024-07-28 ENCOUNTER — Ambulatory Visit: Admitting: Physical Therapy

## 2024-07-28 DIAGNOSIS — M5459 Other low back pain: Secondary | ICD-10-CM | POA: Diagnosis not present

## 2024-07-28 DIAGNOSIS — M5416 Radiculopathy, lumbar region: Secondary | ICD-10-CM

## 2024-07-28 DIAGNOSIS — R296 Repeated falls: Secondary | ICD-10-CM

## 2024-07-28 DIAGNOSIS — M6281 Muscle weakness (generalized): Secondary | ICD-10-CM

## 2024-07-28 DIAGNOSIS — R262 Difficulty in walking, not elsewhere classified: Secondary | ICD-10-CM

## 2024-07-28 DIAGNOSIS — R29818 Other symptoms and signs involving the nervous system: Secondary | ICD-10-CM

## 2024-07-28 NOTE — Therapy (Unsigned)
 OUTPATIENT PHYSICAL THERAPY TREATMENT  Patient Name: Travis Fitzpatrick MRN: [id number] DOB:06-22-1981, 43 y.o., male Today's Date: 07/28/2024   END OF SESSION:  PT End of Session - 07/28/24 1724     Visit Number 94    Number of Visits 108    Date for Recertification  09/07/24    Authorization Type MEDICARE PART B reporting period from 06/15/24    Progress Note Due on Visit 80    PT Start Time 1736    PT Stop Time 1816    PT Time Calculation (min) 40 min    Equipment Utilized During Treatment Gait belt    Activity Tolerance Patient limited by pain;Patient tolerated treatment well    Behavior During Therapy [facility] for tasks assessed/performed              Past Medical History:  Diagnosis Date   Bowel trouble    urgency   Medical history non-contributory    Urinary urgency    Past Surgical History:  Procedure Laterality Date   BACK SURGERY  2010   CIRCUMCISION     LUMBAR LAMINECTOMY/DECOMPRESSION MICRODISCECTOMY  07/20/2011   Procedure: LUMBAR LAMINECTOMY/DECOMPRESSION MICRODISCECTOMY;  Surgeon: Rockey LITTIE Peru;  Location: [facility];  Service: Neurosurgery;  Laterality: N/A;  right thoracotomy with thoracic eight-nine discectomy and fusion   LUMBAR LAMINECTOMY/DECOMPRESSION MICRODISCECTOMY Right 10/06/2021   Procedure: Right Lumbar Four-Five Microdiscectomy, Right Lumbar Five-Sacal One Foraminotomy;  Surgeon: Peru Rockey, MD;  Location: [facility];  Service: Neurosurgery;  Laterality: Right;  3C/RM 21   THORACIC DISCECTOMY  07/16/2012   Procedure: THORACIC DISCECTOMY;  Surgeon: Rockey LITTIE Peru, MD;  Location: [facility];  Service: Neurosurgery;  Laterality: Right;  RIGHT Thoracic seven-eight  thoracic diskectomy via thoracotomy by dr lucas   THORACIC DISCECTOMY N/A 12/15/2014   Procedure: THORACIC SEVEN TO THORACIC NINE Laminectomy ;  Surgeon: Rockey Peru, MD;  Location: [facility];  Service: Neurosurgery;  Laterality: N/A;   THORACIC DISCECTOMY N/A 06/06/2017   Procedure:  LAMINECTOMY THORACIC NINE-TEN;  Surgeon: [place] Rockey, MD;  Location: [facility];  Service: Neurosurgery;  Laterality: N/A;  LAMINECTOMY THORACIC NINE-TEN   THORACOTOMY  07/20/2011   Procedure: THORACOTOMY OPEN FOR SPINE SURGERY;  Surgeon: JONETTA Belvie Nam, MD;  Location: [facility];  Service: Vascular;  Laterality: N/A;   THORACOTOMY  07/16/2012   Procedure: THORACOTOMY OPEN FOR SPINE SURGERY;  Surgeon: Dorise MARLA Lucas, MD;  Location: [facility];  Service: Thoracic;  Laterality: N/A;   Patient Active Problem List   Diagnosis Date Noted   Carpal tunnel syndrome on right 02/19/2024   Spasticity 09/25/2023   Nerve pain 12/05/2022   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   S/P lumbar spinal fusion 05/25/2022   Synovial cyst of lumbar facet joint 05/25/2022   HNP (herniated nucleus pulposus), lumbar 10/06/2021   Hand pain 06/28/2021   Lumbar facet arthropathy 01/11/2021   Thoracic spondylosis with myelopathy 01/11/2021   Thoracic spinal stenosis 06/06/2017   Stenosis, spinal, thoracic 12/15/2014   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 09/22/2014   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/2011    PCP: Maryl Sayres  REFERRING PROVIDER: Babs Arthea DASEN, MD  REFERRING DIAG: thoracic disc disease with myelopathy, nerve pain, adhesive arachnoiditis  Rationale for Evaluation and Treatment: Rehabilitation  THERAPY DIAG:  Other low back pain  Other symptoms and signs involving the nervous system  Radiculopathy, lumbar region  Muscle weakness (generalized)  Repeated falls  Difficulty in walking, not elsewhere classified  ONSET DATE:  Suddenly started having weakness 11 years ago, most recent episode of worsening October 2022, s/p right L4-5 laminotomy, microdiscectomy on 10/06/2021, now s/p L4-5 PLIF by Dr. Gillie on 05/25/2022.  PERTINENT HISTORY:  Patient is a 43 y.o. male who presents to outpatient physical therapy with a referral for medical  diagnosis thoracic disc disease with myelopathy, nerve pain, adhesive arachnoiditis. This patient's chief complaints consist of disabling low back and R >L leg pain and weakness with activity, bowel and bladder urgency, incontinence, and retention, leading to the following functional deficits: difficulty with or unable to complete any activity that requires weight bearing, use of B LE, and/or balance including working, household and community mobility, walking, driving, going to family gatherings, avoiding incontinent episodes, playing with daughter, helping around the house, bed mobility, transfers. 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 Gillie on 05/25/2022, adhesive arachnoiditis, nerve pain.  Patient denies hx of cancer, stroke, seizures, lung problems, heart problems, diabetes, unexplained weight loss, and osteoporosis.  SUBJECTIVE:                                                                                                                                                                                           SUBJECTIVE STATEMENT:     Patient states he hasn't been driving but wants to get back to that. Patient report pain as high as 7/10 during the last week that came on randomly and had him in the bed. Has been doing his exercises and wlaking.  PAIN:  NPRS: 3/10 in the back  PRECAUTIONS: Fall  PATIENT GOALS: To get better, walk, drive again, climb stairs, play with my daughter, help around the house again  OBJECTIVE  TREATMENT  Therapeutic activities: dynamic  therapeutic activities incorporating MULTIPLE parameters or areas of the body designed to achieve improved functional performance.  UE support squats at squat cage (holding on to BB to support bodyweight and offload lumbar spine and LE). Chair placed behind, RW in front, BB at 3rd hole down. CGA from behind with gait belt around waist, ready to keep pt from shifting forwards out of chair if he experiences a sudden back spasm that makes his legs buckle.  2 x 7 1 x 5 back spasm 3 x 7 after changing foot positioning  in front of the bar Seated rests of approx 1 min between sets  Showed UE support squats with towel and straps, partial bodyweight squats at chair and to walker. Patient demonstrated 3 reps of each  Seated alternating dorsiflexion to improve ability to keep toe up when taking steps during walking 2 x 20 each side   Ambulation approx 100 feet down ramp from clinic to vehicle with BRW and SBA  Therapeutic exercise: therapeutic exercises that incorporate ONE parameter at one or more areas of the body to centralize symptoms, develop strength and endurance, range of motion, and flexibility required for successful completion of functional activities.   Seated quad extension on OMEGA machine, seat position 3 B LE 5 x 6 at 35# 1 min rests between sets  Seated overhead pull down (B shoulder extension from ~120 degrees flexion to bar on thighs) with cable bar to improve core strength 2 x 20 at 35# 1 minute rest between  40 min total: 10 min total TE, 30 min NR  PATIENT EDUCATION:  Education details: Exercise purpose/form. Self management techniques. POC.  Person educated: patient Education method: Explanation Education comprehension: verbalized understanding and needs further education  HOME EXERCISE PROGRAM: Verbally:  - seated lumbar flexion stretch with head down and one LE extended using rollator, 1x10 each side.  - curl up with legs elevated, 5x15 seconds - sidelying open  book 1x10 each side  Access Code: [email or web address] URL: [email or web address] Date: 12/18/2023 Prepared by: Camie Cleverly  Exercises - Seated Diaphragmatic Breathing  - 3 reps - 1 minute practice time - Seated Pelvic Floor Elevators  - 3 x daily - 2 sets - 10 reps - Seated Ankle Plantar Flexion with Resistance Loop  - 1 x daily - 3 sets - 20 reps - Seated Ankle Dorsiflexion AROM  - 1 x daily - 3 sets - 10-30 reps - Seated Ankle Inversion AROM  - 1 x daily - 3 sets - 10-30 reps - Seated Ankle Eversion AROM  - 1 x daily - 3 sets - 10-30 reps - Sidelying Ankle Eversion Strengthening with Ankle Weight  - 1 x daily - 3 sets - 20 reps - Sidelying Ankle Inversion with Ankle Weight  - 1 x daily - 3 sets - 20 reps - Seated Ankle Dorsiflexion with Ankle Weight  - 1 x daily - 3 sets - 20 reps  ASSESSMENT:  CLINICAL IMPRESSION:     Patient had back spasm during 3rd set of UE support squats after his 5th rep. Rotated the bar around so he could position feet in front of him more and completed 3 more sets of 7 reps with a back spasm after completion of his last rep. Patient stated he feels better after completed and thinks he was tight  before starting. Patient stated after 5 sets of quad extensions his legs felt very tired but there was still no change in pain. After 2nd set of overhead pull downs patient noted a pulling sensation in the R low back into his hip but not increase in his overall pain. Attempted some UE support squats using towels and straps for him to try at home as the patient was curious.Also showed patient how to do partial squats using UE on chair and progressing to partial squats with his walker.Continued PT remains medically necessary to optimize functional independence and strength to decrease caregiver burden and prevent loss of function and need for higher level of care in the setting of a chronic progressive neurodegenerative condition.  Patient would benefit from continued  management of limiting condition by skilled physical therapist to address remaining impairments and functional limitations to work towards stated goals and return to PLOF or maximal functional independence.   OBJECTIVE IMPAIRMENTS: Abnormal gait, decreased activity tolerance, decreased balance, decreased coordination, decreased endurance, decreased knowledge of condition, decreased knowledge of use of DME, decreased mobility, difficulty walking, decreased ROM, decreased strength, increased edema, impaired perceived functional ability, increased muscle spasms, impaired flexibility, impaired sensation, impaired tone, improper body mechanics, postural dysfunction, obesity, and pain.   ACTIVITY LIMITATIONS: carrying, lifting, bending, standing, squatting, stairs, transfers, bed mobility, continence, bathing, toileting, dressing, hygiene/grooming, locomotion level, and caring for others  PARTICIPATION LIMITATIONS: meal prep, cleaning, laundry, interpersonal relationship, driving, shopping, community activity, occupation, yard work, and  difficulty with or unable to complete any activity that requires weight bearing, use of B LE, and/or balance including working, household and community mobility, walking, driving, going to family gatherings, avoiding incontinent episodes, playing with daughter, helping around the house, bed mobility, transfers  PERSONAL FACTORS: Past/current experiences, Time since onset of injury/illness/exacerbation, and 3+ comorbidities:  3 thoracic spine surgeries, thoracic MRI notes 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 Gillie on 05/25/2022, adhesive arachnoiditis, nerve  pain are also affecting patient's functional outcome.   REHAB POTENTIAL: Fair due to severity and nature of condition.   CLINICAL DECISION MAKING: Evolving/moderate complexity  EVALUATION COMPLEXITY: Moderate   GOALS: Goals reviewed with patient? No  SHORT TERM GOALS: Target date: 01/09/2023. Target date updated to 05/28/2023 for all unmet goals on 03/05/2023.   Patient will be independent with initial home exercise program for self-management of symptoms. Baseline: Initial HEP to be provided at visit 2 as appropriate (12/26/22); Goal status: MET  LONG TERM GOALS: Target date: 03/20/2023. Target date updated to 08/26/23  for all unmet goals on 06/03/2023. Target date updated to 11/20/2023 for all  unmet goals on 08/28/2023. Target date updated to 02/17/2024  for all unmet goals on 11/25/2023. Target date updated to 04/14/2024 for all unmet goals on 01/22/2024. Updated to 07/13/2024 for all unmet goals on 04/20/2024. Updated to 09/07/2024 for all unmet goals on 06/15/2024.   Patient will be independent with a long-term home exercise program for self-management of symptoms.  Baseline: Initial HEP to be provided at visit 2 as appropriate (12/26/22); participating as able (01/31/2023); patient currently participating as tolerated (03/05/2023; 04/09/2023; 06/03/2023); participates daily in mat exercises and in weight bearing exercises as tolerated (10/08/2023; 11/25/2023); Tries to do daily but sometimes cannot when his body won't let him (01/22/2024); participating as able including activities he finds way to mimic at home that he learns in clinic (03/17/2024); continues to complete exercises as tolerated (04/20/2024); continues to complete HEP as able (06/15/24);  Goal status: In-progress  2.  Patient will demonstrate improved FOTO by equal or greater than 10 points by visit #13 to demonstrate improvement in overall condition and self-reported functional ability.  Baseline: to be tested visit 2 as appropriate (12/26/22);  30 at visit #3 (01/02/2023); 46 at visit #13 (03/05/2023); 45 at visit #29 (06/03/2023); 49 at visit #50 (10/08/2023);  Goal status: MET  3.  Patient will demonstrate the ability to ambulate equal or greater than 600 feet with  LRAD during the 6 minute walk to improve his household and community mobility.  Baseline: 34 feet with bari-RW (12/26/22); 200 feet with bari-RW (01/31/2023); 500 feet with BRW (03/05/2023); 400 feet with BRW (04/09/2023); 152 feet with BRW and W/C follow for safety (06/03/2023); 330 feet with BRW (10/08/2023); 245 feet with BRW and SBA for safety (11/25/2023); 343 feet with BRW and SBA for safety (01/22/2024); 340 feet with BRW and SBA for safety (04/20/2024); 412 feet with BRW and W/C follow for safety (06/15/2024);  Goal status: In progress  4.  Patient will complete 5 Time Sit To Stand Test from 19.5 inch surface or lower in equal or less than 15 seconds with no UE support to demonstrate improved transfer ability for improved household and toileting mobility.  Baseline: 23 seconds with heavy B UE support on RW from 19.5 inch plinth. Pain throbbing down the right LE.  (12/26/22); 19 seconds with heavy B UE support on RW from 19.5 inch plinth (03/05/2023); 20 seconds with heavy B UE support on RRW from 18.5 inch plinth. Painful in back of B calves, thighs, and lower back (04/09/2023); 15 seconds with heavy B UE support on BRW from 18.5 inch plinth. Painful in back of B calves, thighs, and lower back (06/03/2023); 19 seconds with heavy B UE support on BRW from 18.5 inch plinth. Painful in back of B calves, thighs, and lower back (10/08/2023); 18 seconds with heavy B UE support on BRW from 18.5 inch plinth. Painful in back of B calves, thighs, and lower back (11/25/2023); 15seconds with heavy B UE support on BRW from 18.5 inch plinth. Painful in back of B calves, thighs, and lower back (01/22/2024);  14 seconds with heavy B UE support on BRW from 18.5 inch plinth. Painful in back of B calves, thighs,  and lower back (04/20/2024); 13 seconds with heavy B UE support on RW and one instance of leaning back of legs on plinth due to LOB backwards (06/15/2024);  Goal status: Ongoing  5.  Patient will report worst pain equal or less than 3/10 with functional activities to improve his ability to complete basic household and community mobility. Baseline: up to 8/10 (12/26/22); reports 3/10 pain (01/31/2023); reports 4/10 pain (02/19/2023); up to 7/10 in the last 2 weeks (03/05/2023): up to  7/10 in the last two weeks (04/09/2023); up to 8/10 in the last 2 weeks (06/03/2023);  up to 9/10 over the last 2 weeks (10/08/2023); up to 7-8/10 in the last 2 weeks (11/25/2023); 7/10 in the last 2 weeks (01/22/2024); better controlled in last 2 weeks but no specific number obtained (03/18/2024); 9/10 with medication (06/15/2024);  Goal status: Ongoing  6.  Patient will demonstrate improvement in Patient Specific Functional Scale (PSFS) by equal or greater than 3/10 points to reflect clinically significant improvement in patient's most valued functional activities. Baseline: 1.7/10 for Walking Around [address], Enjoying Life, and Driving (97/74/74); 7.2/89 (11/25/2023); 2.7/10 (01/22/24); 2.7/10 (04/20/2024); 2.7/10 (06/15/2024);  Goal status: ongoing  PLAN:  PT FREQUENCY: 1-2x/week PT DURATION: 12 weeks PLANNED INTERVENTIONS: Therapeutic exercises, Therapeutic activity, Neuromuscular re-education, Balance training, Gait training, Patient/Family education, Self Care, Joint mobilization, Stair training, Orthotic/Fit training, DME instructions, Aquatic Therapy, Dry Needling, Electrical stimulation, Wheelchair mobility training, Spinal mobilization, Cryotherapy, Moist heat, Manual therapy, and Re-evaluation.  PLAN FOR NEXT SESSION:  Continue with functional, strength, and mobility training as tolerated.  update HEP as appropriate, gait training, LE/core/functional strengthening, stretching, and balance as tolerated, education, manual therapy  as needed.  Vernell Rise,  SPT 07/28/2024  [facility] [address] [place], [place] [address] P: [phone] I F: [phone]

## 2024-07-29 NOTE — Therapy (Signed)
 OUTPATIENT PHYSICAL THERAPY TREATMENT  Patient Name: Travis Fitzpatrick MRN: [id number] DOB:03-12-1981, 43 y.o., male Today's Date: 07/29/2024   END OF SESSION:  PT End of Session - 07/28/24 1724     Visit Number 94    Number of Visits 108    Date for Recertification  09/07/24    Authorization Type MEDICARE PART B reporting period from 06/15/24    Progress Note Due on Visit 80    PT Start Time 1736    PT Stop Time 1816    PT Time Calculation (min) 40 min    Equipment Utilized During Treatment Gait belt    Activity Tolerance Patient limited by pain;Patient tolerated treatment well    Behavior During Therapy [facility] for tasks assessed/performed              Past Medical History:  Diagnosis Date   Bowel trouble    urgency   Medical history non-contributory    Urinary urgency    Past Surgical History:  Procedure Laterality Date   BACK SURGERY  2010   CIRCUMCISION     LUMBAR LAMINECTOMY/DECOMPRESSION MICRODISCECTOMY  07/20/2011   Procedure: LUMBAR LAMINECTOMY/DECOMPRESSION MICRODISCECTOMY;  Surgeon: Rockey LITTIE Peru;  Location: [facility];  Service: Neurosurgery;  Laterality: N/A;  right thoracotomy with thoracic eight-nine discectomy and fusion   LUMBAR LAMINECTOMY/DECOMPRESSION MICRODISCECTOMY Right 10/06/2021   Procedure: Right Lumbar Four-Five Microdiscectomy, Right Lumbar Five-Sacal One Foraminotomy;  Surgeon: Peru Rockey, MD;  Location: [facility];  Service: Neurosurgery;  Laterality: Right;  3C/RM 21   THORACIC DISCECTOMY  07/16/2012   Procedure: THORACIC DISCECTOMY;  Surgeon: Rockey LITTIE Peru, MD;  Location: [facility];  Service: Neurosurgery;  Laterality: Right;  RIGHT Thoracic seven-eight  thoracic diskectomy via thoracotomy by dr lucas   THORACIC DISCECTOMY N/A 12/15/2014   Procedure: THORACIC SEVEN TO THORACIC NINE Laminectomy ;  Surgeon: Rockey Peru, MD;  Location: [facility];  Service: Neurosurgery;  Laterality: N/A;   THORACIC DISCECTOMY N/A 06/06/2017   Procedure:  LAMINECTOMY THORACIC NINE-TEN;  Surgeon: [place] Rockey, MD;  Location: [facility];  Service: Neurosurgery;  Laterality: N/A;  LAMINECTOMY THORACIC NINE-TEN   THORACOTOMY  07/20/2011   Procedure: THORACOTOMY OPEN FOR SPINE SURGERY;  Surgeon: JONETTA Belvie Nam, MD;  Location: [facility];  Service: Vascular;  Laterality: N/A;   THORACOTOMY  07/16/2012   Procedure: THORACOTOMY OPEN FOR SPINE SURGERY;  Surgeon: Dorise MARLA Lucas, MD;  Location: [facility];  Service: Thoracic;  Laterality: N/A;   Patient Active Problem List   Diagnosis Date Noted   Carpal tunnel syndrome on right 02/19/2024   Spasticity 09/25/2023   Nerve pain 12/05/2022   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   S/P lumbar spinal fusion 05/25/2022   Synovial cyst of lumbar facet joint 05/25/2022   HNP (herniated nucleus pulposus), lumbar 10/06/2021   Hand pain 06/28/2021   Lumbar facet arthropathy 01/11/2021   Thoracic spondylosis with myelopathy 01/11/2021   Thoracic spinal stenosis 06/06/2017   Stenosis, spinal, thoracic 12/15/2014   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 09/22/2014   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/2011    PCP: Maryl Sayres  REFERRING PROVIDER: Babs Arthea DASEN, MD  REFERRING DIAG: thoracic disc disease with myelopathy, nerve pain, adhesive arachnoiditis  Rationale for Evaluation and Treatment: Rehabilitation  THERAPY DIAG:  Other low back pain  Other symptoms and signs involving the nervous system  Radiculopathy, lumbar region  Muscle weakness (generalized)  Repeated falls  Difficulty in walking, not elsewhere classified  ONSET DATE:  Suddenly started having weakness 11 years ago, most recent episode of worsening October 2022, s/p right L4-5 laminotomy, microdiscectomy on 10/06/2021, now s/p L4-5 PLIF by Dr. Gillie on 05/25/2022.  PERTINENT HISTORY:  Patient is a 43 y.o. male who presents to outpatient physical therapy with a referral for medical  diagnosis thoracic disc disease with myelopathy, nerve pain, adhesive arachnoiditis. This patient's chief complaints consist of disabling low back and R >L leg pain and weakness with activity, bowel and bladder urgency, incontinence, and retention, leading to the following functional deficits: difficulty with or unable to complete any activity that requires weight bearing, use of B LE, and/or balance including working, household and community mobility, walking, driving, going to family gatherings, avoiding incontinent episodes, playing with daughter, helping around the house, bed mobility, transfers. 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 Gillie on 05/25/2022, adhesive arachnoiditis, nerve pain.  Patient denies hx of cancer, stroke, seizures, lung problems, heart problems, diabetes, unexplained weight loss, and osteoporosis.  SUBJECTIVE:                                                                                                                                                                                           SUBJECTIVE STATEMENT:     Patient states he hasn't been driving but wants to get back to that. Patient report pain as high as 7/10 during the last week that came on randomly and had him in the bed. Has been doing his exercises and wlaking.  PAIN:  NPRS: 3/10 in the back  PRECAUTIONS: Fall  PATIENT GOALS: To get better, walk, drive again, climb stairs, play with my daughter, help around the house again  OBJECTIVE  TREATMENT  Therapeutic activities: dynamic  therapeutic activities incorporating MULTIPLE parameters or areas of the body designed to achieve improved functional performance.  UE support squats at squat cage (holding on to BB to support bodyweight and offload lumbar spine and LE). Chair placed behind, RW in front, BB at 3rd hole down. CGA from behind with gait belt around waist, ready to keep pt from shifting forwards out of chair if he experiences a sudden back spasm that makes his legs buckle.  2 x 7 1 x 5 back spasm 3 x 7 after changing foot positioning  in front of the bar Seated rests of approx 1 min between sets  Showed UE support squats with towel and straps at the door, partial bodyweight squats at chair and to walker. Patient demonstrated 3 reps of each.  Seated alternating dorsiflexion to improve ability to keep toe up when taking steps during walking 2 x 20 each side   Ambulation approx 50 feet to chair outside clinic with BRW and SBA   Therapeutic exercise: therapeutic exercises that incorporate ONE parameter at one or more areas of the body to centralize symptoms, develop strength and endurance, range of motion, and flexibility required for successful completion of functional activities.   Seated quad extension on OMEGA machine, seat position 3 B LE 5 x 6 at 35# 1 min rests between sets  Seated overhead pull down (B shoulder extension from ~120 degrees flexion to bar on thighs) with cable bar to improve core strength 2 x 20 at 35# 1 minute rest between   PATIENT EDUCATION:  Education details: Exercise purpose/form. Self management techniques. POC.  Person educated: patient Education method: Explanation Education comprehension: verbalized understanding and needs further education  HOME EXERCISE PROGRAM: Verbally:  - seated lumbar flexion stretch with head down and one LE extended using rollator, 1x10 each side.  - curl up with legs elevated, 5x15 seconds - sidelying open book 1x10 each side  Access Code:  [email or web address] URL: [email or web address] Date: 12/18/2023 Prepared by: Camie Cleverly  Exercises - Seated Diaphragmatic Breathing  - 3 reps - 1 minute practice time - Seated Pelvic Floor Elevators  - 3 x daily - 2 sets - 10 reps - Seated Ankle Plantar Flexion with Resistance Loop  - 1 x daily - 3 sets - 20 reps - Seated Ankle Dorsiflexion AROM  - 1 x daily - 3 sets - 10-30 reps - Seated Ankle Inversion AROM  - 1 x daily - 3 sets - 10-30 reps - Seated Ankle Eversion AROM  - 1 x daily - 3 sets - 10-30 reps - Sidelying Ankle Eversion Strengthening with Ankle Weight  - 1 x daily - 3 sets - 20 reps - Sidelying Ankle Inversion with Ankle Weight  - 1 x daily - 3 sets - 20 reps - Seated Ankle Dorsiflexion with Ankle Weight  - 1 x daily - 3 sets - 20 reps  ASSESSMENT:  CLINICAL IMPRESSION:     Patient had back spasm during 3rd set of UE support squats after his 5th rep. Rotated the bar around so he could position feet in front of him more and completed 3 more sets of 7 reps with a back spasm after completion of his last rep. Patient stated he feels better after completed and thinks he was tight  before starting. Patient stated after 5 sets of quad extensions his legs felt very tired but there was still no change in pain. After 2nd set of overhead pull downs patient noted a pulling sensation in the R low back into his hip but not increase in his overall pain. Attempted some UE support squats using towels and straps for him to try at home as the patient was curious. Also showed patient how to do partial squats using UE on chair and progressing to partial squats with his walker. Continued PT remains medically necessary to optimize functional independence and strength to decrease caregiver burden and prevent loss of function and need for higher level of care in the setting of a chronic progressive neurodegenerative condition. Patient would benefit from continued management  of limiting condition by  skilled physical therapist to address remaining impairments and functional limitations to work towards stated goals and return to PLOF or maximal functional independence.   OBJECTIVE IMPAIRMENTS: Abnormal gait, decreased activity tolerance, decreased balance, decreased coordination, decreased endurance, decreased knowledge of condition, decreased knowledge of use of DME, decreased mobility, difficulty walking, decreased ROM, decreased strength, increased edema, impaired perceived functional ability, increased muscle spasms, impaired flexibility, impaired sensation, impaired tone, improper body mechanics, postural dysfunction, obesity, and pain.   ACTIVITY LIMITATIONS: carrying, lifting, bending, standing, squatting, stairs, transfers, bed mobility, continence, bathing, toileting, dressing, hygiene/grooming, locomotion level, and caring for others  PARTICIPATION LIMITATIONS: meal prep, cleaning, laundry, interpersonal relationship, driving, shopping, community activity, occupation, yard work, and  difficulty with or unable to complete any activity that requires weight bearing, use of B LE, and/or balance including working, household and community mobility, walking, driving, going to family gatherings, avoiding incontinent episodes, playing with daughter, helping around the house, bed mobility, transfers  PERSONAL FACTORS: Past/current experiences, Time since onset of injury/illness/exacerbation, and 3+ comorbidities:  3 thoracic spine surgeries, thoracic MRI notes 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 Gillie on 05/25/2022, adhesive arachnoiditis, nerve pain are also affecting patient's  functional outcome.   REHAB POTENTIAL: Fair due to severity and nature of condition.   CLINICAL DECISION MAKING: Evolving/moderate complexity  EVALUATION COMPLEXITY: Moderate   GOALS: Goals reviewed with patient? No  SHORT TERM GOALS: Target date: 01/09/2023. Target date updated to 05/28/2023 for all unmet goals on 03/05/2023.   Patient will be independent with initial home exercise program for self-management of symptoms. Baseline: Initial HEP to be provided at visit 2 as appropriate (12/26/22); Goal status: MET  LONG TERM GOALS: Target date: 03/20/2023. Target date updated to 08/26/23  for all unmet goals on 06/03/2023. Target date updated to 11/20/2023 for all  unmet goals on 08/28/2023. Target date updated to 02/17/2024  for all unmet goals on 11/25/2023. Target date updated to 04/14/2024 for all unmet goals on 01/22/2024. Updated to 07/13/2024 for all unmet goals on 04/20/2024. Updated to 09/07/2024 for all unmet goals on 06/15/2024.   Patient will be independent with a long-term home exercise program for self-management of symptoms.  Baseline: Initial HEP to be provided at visit 2 as appropriate (12/26/22); participating as able (01/31/2023); patient currently participating as tolerated (03/05/2023; 04/09/2023; 06/03/2023); participates daily in mat exercises and in weight bearing exercises as tolerated (10/08/2023; 11/25/2023); Tries to do daily but sometimes cannot when his body won't let him (01/22/2024); participating as able including activities he finds way to mimic at home that he learns in clinic (03/17/2024); continues to complete exercises as tolerated (04/20/2024); continues to complete HEP as able (06/15/24);  Goal status: In-progress  2.  Patient will demonstrate improved FOTO by equal or greater than 10 points by visit #13 to demonstrate improvement in overall condition and self-reported functional ability.  Baseline: to be tested visit 2 as appropriate (12/26/22); 30 at visit #3 (01/02/2023); 46 at  visit #13 (03/05/2023); 45 at visit #29 (06/03/2023); 49 at visit #50 (10/08/2023);  Goal status: MET  3.  Patient will demonstrate the ability to ambulate equal or greater than 600 feet with LRAD during the 6 minute walk  to improve his household and community mobility.  Baseline: 34 feet with bari-RW (12/26/22); 200 feet with bari-RW (01/31/2023); 500 feet with BRW (03/05/2023); 400 feet with BRW (04/09/2023); 152 feet with BRW and W/C follow for safety (06/03/2023); 330 feet with BRW (10/08/2023); 245 feet with BRW and SBA for safety (11/25/2023); 343 feet with BRW and SBA for safety (01/22/2024); 340 feet with BRW and SBA for safety (04/20/2024); 412 feet with BRW and W/C follow for safety (06/15/2024);  Goal status: In progress  4.  Patient will complete 5 Time Sit To Stand Test from 19.5 inch surface or lower in equal or less than 15 seconds with no UE support to demonstrate improved transfer ability for improved household and toileting mobility.  Baseline: 23 seconds with heavy B UE support on RW from 19.5 inch plinth. Pain throbbing down the right LE.  (12/26/22); 19 seconds with heavy B UE support on RW from 19.5 inch plinth (03/05/2023); 20 seconds with heavy B UE support on RRW from 18.5 inch plinth. Painful in back of B calves, thighs, and lower back (04/09/2023); 15 seconds with heavy B UE support on BRW from 18.5 inch plinth. Painful in back of B calves, thighs, and lower back (06/03/2023); 19 seconds with heavy B UE support on BRW from 18.5 inch plinth. Painful in back of B calves, thighs, and lower back (10/08/2023); 18 seconds with heavy B UE support on BRW from 18.5 inch plinth. Painful in back of B calves, thighs, and lower back (11/25/2023); 15seconds with heavy B UE support on BRW from 18.5 inch plinth. Painful in back of B calves, thighs, and lower back (01/22/2024);  14 seconds with heavy B UE support on BRW from 18.5 inch plinth. Painful in back of B calves, thighs, and lower back (04/20/2024); 13  seconds with heavy B UE support on RW and one instance of leaning back of legs on plinth due to LOB backwards (06/15/2024);  Goal status: Ongoing  5.  Patient will report worst pain equal or less than 3/10 with functional activities to improve his ability to complete basic household and community mobility. Baseline: up to 8/10 (12/26/22); reports 3/10 pain (01/31/2023); reports 4/10 pain (02/19/2023); up to 7/10 in the last 2 weeks (03/05/2023): up to  7/10 in the last two weeks (04/09/2023); up to 8/10 in the last 2 weeks (06/03/2023);  up to 9/10 over the last 2 weeks (10/08/2023); up to 7-8/10 in the last 2 weeks (11/25/2023); 7/10 in the last 2 weeks (01/22/2024); better controlled in last 2 weeks but no specific number obtained (03/18/2024); 9/10 with medication (06/15/2024);  Goal status: Ongoing  6.  Patient will demonstrate improvement in Patient Specific Functional Scale (PSFS) by equal or greater than 3/10 points to reflect clinically significant improvement in patient's most valued functional activities. Baseline: 1.7/10 for Walking Around [address], Enjoying Life, and Driving (97/74/74); 7.2/89 (11/25/2023); 2.7/10 (01/22/24); 2.7/10 (04/20/2024); 2.7/10 (06/15/2024);  Goal status: ongoing  PLAN:  PT FREQUENCY: 1-2x/week PT DURATION: 12 weeks PLANNED INTERVENTIONS: Therapeutic exercises, Therapeutic activity, Neuromuscular re-education, Balance training, Gait training, Patient/Family education, Self Care, Joint mobilization, Stair training, Orthotic/Fit training, DME instructions, Aquatic Therapy, Dry Needling, Electrical stimulation, Wheelchair mobility training, Spinal mobilization, Cryotherapy, Moist heat, Manual therapy, and Re-evaluation.  PLAN FOR NEXT SESSION:  Continue with functional, strength, and mobility training as tolerated.  update HEP as appropriate, gait training, LE/core/functional strengthening, stretching, and balance as tolerated, education, manual therapy as needed.  Vernell Rise,  SPT 07/29/2024  Student physical therapist  under direct supervision of licensed physical therapists during the entirety of the session.   Camie SAUNDERS. Juli, PT, DPT, Cert. MDT, PRA-C 07/29/2024, 11:48 AM  [facility] [address] [place], [place] [address] P: [phone] I F: [phone]

## 2024-07-30 ENCOUNTER — Ambulatory Visit: Admitting: Physical Therapy

## 2024-08-04 ENCOUNTER — Ambulatory Visit: Admitting: Physical Therapy

## 2024-08-04 DIAGNOSIS — M5459 Other low back pain: Secondary | ICD-10-CM

## 2024-08-04 DIAGNOSIS — M5416 Radiculopathy, lumbar region: Secondary | ICD-10-CM

## 2024-08-04 DIAGNOSIS — M6281 Muscle weakness (generalized): Secondary | ICD-10-CM

## 2024-08-04 DIAGNOSIS — R262 Difficulty in walking, not elsewhere classified: Secondary | ICD-10-CM

## 2024-08-04 DIAGNOSIS — R29818 Other symptoms and signs involving the nervous system: Secondary | ICD-10-CM

## 2024-08-04 DIAGNOSIS — R296 Repeated falls: Secondary | ICD-10-CM

## 2024-08-04 NOTE — Therapy (Signed)
 " OUTPATIENT PHYSICAL THERAPY TREATMENT  Patient Name: Travis Fitzpatrick MRN: [id number] DOB:[birth year], 43 y.o., male Today's Date: 08/04/2024   END OF SESSION:  PT End of Session - 08/04/24 1734     Visit Number 95    Number of Visits 108    Date for Recertification  09/07/24    Authorization Type MEDICARE PART B reporting period from 06/15/24    Progress Note Due on Visit 80    PT Start Time 1734    PT Stop Time 1814    PT Time Calculation (min) 40 min    Equipment Utilized During Treatment Gait belt    Activity Tolerance Patient limited by pain;Patient tolerated treatment well    Behavior During Therapy [facility] for tasks assessed/performed               Past Medical History:  Diagnosis Date   Bowel trouble    urgency   Medical history non-contributory    Urinary urgency    Past Surgical History:  Procedure Laterality Date   BACK SURGERY  2010   CIRCUMCISION     LUMBAR LAMINECTOMY/DECOMPRESSION MICRODISCECTOMY  07/20/2011   Procedure: LUMBAR LAMINECTOMY/DECOMPRESSION MICRODISCECTOMY;  Surgeon: Rockey LITTIE Peru;  Location: [facility];  Service: Neurosurgery;  Laterality: N/A;  right thoracotomy with thoracic eight-nine discectomy and fusion   LUMBAR LAMINECTOMY/DECOMPRESSION MICRODISCECTOMY Right 10/06/2021   Procedure: Right Lumbar Four-Five Microdiscectomy, Right Lumbar Five-Sacal One Foraminotomy;  Surgeon: Peru Rockey, MD;  Location: [facility];  Service: Neurosurgery;  Laterality: Right;  3C/RM 21   THORACIC DISCECTOMY  07/16/2012   Procedure: THORACIC DISCECTOMY;  Surgeon: Rockey LITTIE Peru, MD;  Location: [facility];  Service: Neurosurgery;  Laterality: Right;  RIGHT Thoracic seven-eight  thoracic diskectomy via thoracotomy by dr lucas   THORACIC DISCECTOMY N/A 12/15/2014   Procedure: THORACIC SEVEN TO THORACIC NINE Laminectomy ;  Surgeon: Rockey Peru, MD;  Location: [facility];  Service: Neurosurgery;  Laterality: N/A;   THORACIC DISCECTOMY N/A 06/06/2017   Procedure:  LAMINECTOMY THORACIC NINE-TEN;  Surgeon: [place] Rockey, MD;  Location: [facility];  Service: Neurosurgery;  Laterality: N/A;  LAMINECTOMY THORACIC NINE-TEN   THORACOTOMY  07/20/2011   Procedure: THORACOTOMY OPEN FOR SPINE SURGERY;  Surgeon: JONETTA Belvie Nam, MD;  Location: [facility];  Service: Vascular;  Laterality: N/A;   THORACOTOMY  07/16/2012   Procedure: THORACOTOMY OPEN FOR SPINE SURGERY;  Surgeon: Dorise MARLA Lucas, MD;  Location: [facility];  Service: Thoracic;  Laterality: N/A;   Patient Active Problem List   Diagnosis Date Noted   Carpal tunnel syndrome on right 02/19/2024   Spasticity 09/25/2023   Nerve pain 12/05/2022   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   S/P lumbar spinal fusion 05/25/2022   Synovial cyst of lumbar facet joint 05/25/2022   HNP (herniated nucleus pulposus), lumbar 10/06/2021   Hand pain 06/28/2021   Lumbar facet arthropathy 01/11/2021   Thoracic spondylosis with myelopathy 01/11/2021   Thoracic spinal stenosis 06/06/2017   Stenosis, spinal, thoracic 12/15/2014   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 09/22/2014   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/2011    PCP: Maryl Sayres  REFERRING PROVIDER: Babs Arthea DASEN, MD  REFERRING DIAG: thoracic disc disease with myelopathy, nerve pain, adhesive arachnoiditis  Rationale for Evaluation and Treatment: Rehabilitation  THERAPY DIAG:  Other low back pain  Other symptoms and signs involving the nervous system  Radiculopathy, lumbar region  Muscle weakness (generalized)  Repeated falls  Difficulty in walking, not elsewhere  classified  ONSET DATE:  Suddenly started having weakness 11 years ago, most recent episode of worsening October 2022, s/p right L4-5 laminotomy, microdiscectomy on 10/06/2021, now s/p L4-5 PLIF by Dr. Gillie on 05/25/2022.  PERTINENT HISTORY:  Patient is a 43 y.o. male who presents to outpatient physical therapy with a referral for medical  diagnosis thoracic disc disease with myelopathy, nerve pain, adhesive arachnoiditis. This patient's chief complaints consist of disabling low back and R >L leg pain and weakness with activity, bowel and bladder urgency, incontinence, and retention, leading to the following functional deficits: difficulty with or unable to complete any activity that requires weight bearing, use of B LE, and/or balance including working, household and community mobility, walking, driving, going to family gatherings, avoiding incontinent episodes, playing with daughter, helping around the house, bed mobility, transfers. 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 Gillie on 05/25/2022, adhesive arachnoiditis, nerve pain.  Patient denies hx of cancer, stroke, seizures, lung problems, heart problems, diabetes, unexplained weight loss, and osteoporosis.  SUBJECTIVE:                                                                                                                                                                                           SUBJECTIVE STATEMENT:     Patient arrives with BRW. He states he has increased pain with the R groin and hip. He states this got really bad after last PT session and stuck in the bed for 2 days. He states this is the feeling he gets before his R LE starts giving out. He has not been doing any driving lately. He has plans to practice the pedals in his truck.   PAIN:  NPRS: 4/10 in the low back, R groin and hip, down back of both legs  PRECAUTIONS: Fall  PATIENT GOALS:  To get better, walk, drive again, climb stairs, play with my daughter, help around the house again  OBJECTIVE  TREATMENT  Therapeutic activities: dynamic therapeutic activities incorporating MULTIPLE parameters or areas of the body designed to achieve improved functional performance.  UE supported squats at squat cage (holding on to BB to support bodyweight and offload lumbar spine and LE). Chair placed behind, facing outwards, RW in front, BB moved up to top hole. CGA from behind with  gait belt around waist, ready to keep pt from shifting forwards out of chair if he experiences a sudden back spasm that makes his legs buckle.  1x7 1x6  Seated alternating dorsiflexion to improve ability to keep toe up when taking steps during walking 2 x 20 each side   Ambulation approx 50 feet to chair outside clinic with BRW and SBA   Therapeutic exercise: therapeutic exercises that incorporate ONE parameter at one or more areas of the body to centralize symptoms, develop strength and endurance, range of motion, and flexibility required for successful completion of functional activities.   Seated quad extension on OMEGA machine, seat position 3 B LE 3x7 at 35# 1x6 at 35# 1 min rests between sets  Seated overhead pull down (B shoulder extension from ~120 degrees flexion to bar on thighs) with cable bar to improve core strength 3x20 at 35# 1 minute rest between  PATIENT EDUCATION:  Education details: Exercise purpose/form. Self management techniques. POC.  Person educated: patient Education method: Explanation Education comprehension: verbalized understanding and needs further education  HOME EXERCISE PROGRAM: Verbally:  - seated lumbar flexion stretch with head down and one LE extended using rollator, 1x10 each side.  - curl up with legs elevated, 5x15 seconds - sidelying open book 1x10 each side  Access Code: [email or web address] URL: [email or web address] Date: 12/18/2023 Prepared  by: Camie Cleverly  Exercises - Seated Diaphragmatic Breathing  - 3 reps - 1 minute practice time - Seated Pelvic Floor Elevators  - 3 x daily - 2 sets - 10 reps - Seated Ankle Plantar Flexion with Resistance Loop  - 1 x daily - 3 sets - 20 reps - Seated Ankle Dorsiflexion AROM  - 1 x daily - 3 sets - 10-30 reps - Seated Ankle Inversion AROM  - 1 x daily - 3 sets - 10-30 reps - Seated Ankle Eversion AROM  - 1 x daily - 3 sets - 10-30 reps - Sidelying Ankle Eversion Strengthening with Ankle Weight  - 1 x daily - 3 sets - 20 reps - Sidelying Ankle Inversion with Ankle Weight  - 1 x daily - 3 sets - 20 reps - Seated Ankle Dorsiflexion with Ankle Weight  - 1 x daily - 3 sets - 20 reps  ASSESSMENT:  CLINICAL IMPRESSION:     Continued with functional, LE, and core strengthening. Patient had good tolerance with no increase in pain by end of visit. He noted a feeling of pinching nerve when performing squats during the eccentric phase where he feels sudden lack of control and weakness, but was able to complete them safely with UE support and CGA from PT with chair behind. Patient would benefit from continued management of limiting condition by skilled physical therapist to address remaining impairments and functional limitations to work towards stated goals and return to PLOF or maximal functional independence.   OBJECTIVE IMPAIRMENTS: Abnormal gait, decreased activity tolerance, decreased balance, decreased coordination, decreased endurance, decreased knowledge of condition, decreased knowledge of use of DME, decreased mobility, difficulty walking, decreased ROM, decreased strength, increased edema, impaired perceived functional ability, increased muscle spasms, impaired flexibility, impaired sensation, impaired tone, improper body mechanics, postural dysfunction, obesity, and pain.   ACTIVITY LIMITATIONS: carrying, lifting, bending, standing, squatting, stairs, transfers, bed mobility, continence,  bathing, toileting, dressing, hygiene/grooming, locomotion level, and caring for others  PARTICIPATION LIMITATIONS: meal prep, cleaning, laundry, interpersonal relationship, driving, shopping, community activity, occupation, yard work, and  difficulty with or unable to complete any activity that requires weight bearing,  use of B LE, and/or balance including working, household and community mobility, walking, driving, going to family gatherings, avoiding incontinent episodes, playing with daughter, helping around the house, bed mobility, transfers  PERSONAL FACTORS: Past/current experiences, Time since onset of injury/illness/exacerbation, and 3+ comorbidities:  3 thoracic spine surgeries, thoracic MRI notes 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 Gillie on 05/25/2022, adhesive arachnoiditis, nerve pain are also affecting patient's functional outcome.   REHAB POTENTIAL: Fair due to severity and nature of condition.   CLINICAL DECISION MAKING: Evolving/moderate complexity  EVALUATION COMPLEXITY: Moderate   GOALS: Goals reviewed with patient? No  SHORT TERM GOALS: Target date: 01/09/2023. Target date updated to 05/28/2023 for all unmet goals on 03/05/2023.   Patient will be independent with initial home exercise program for self-management of symptoms. Baseline: Initial HEP to be provided at visit 2 as appropriate (12/26/22); Goal status: MET  LONG TERM GOALS: Target date: 03/20/2023. Target date updated to 08/26/23  for all unmet goals on 06/03/2023. Target date updated to 11/20/2023 for all  unmet goals on 08/28/2023. Target date updated to 02/17/2024  for all unmet goals on  11/25/2023. Target date updated to 04/14/2024 for all unmet goals on 01/22/2024. Updated to 07/13/2024 for all unmet goals on 04/20/2024. Updated to 09/07/2024 for all unmet goals on 06/15/2024.   Patient will be independent with a long-term home exercise program for self-management of symptoms.  Baseline: Initial HEP to be provided at visit 2 as appropriate (12/26/22); participating as able (01/31/2023); patient currently participating as tolerated (03/05/2023; 04/09/2023; 06/03/2023); participates daily in mat exercises and in weight bearing exercises as tolerated (10/08/2023; 11/25/2023); Tries to do daily but sometimes cannot when his body won't let him (01/22/2024); participating as able including activities he finds way to mimic at home that he learns in clinic (03/17/2024); continues to complete exercises as tolerated (04/20/2024); continues to complete HEP as able (06/15/24);  Goal status: In-progress  2.  Patient will demonstrate improved FOTO by equal or greater than 10 points by visit #13 to demonstrate improvement in overall condition and self-reported functional ability.  Baseline: to be tested visit 2 as appropriate (12/26/22); 30 at visit #3 (01/02/2023); 46 at visit #13 (03/05/2023); 45 at visit #29 (06/03/2023); 49 at visit #50 (10/08/2023);  Goal status: MET  3.  Patient will demonstrate the ability to ambulate equal or greater than 600 feet with LRAD during the 6 minute walk to improve his household and community mobility.  Baseline: 34 feet with bari-RW (12/26/22); 200 feet with bari-RW (01/31/2023); 500 feet with BRW (03/05/2023); 400 feet with BRW (04/09/2023); 152 feet with BRW and W/C follow for safety (06/03/2023); 330 feet with BRW (10/08/2023); 245 feet with BRW and SBA for safety (11/25/2023); 343 feet with BRW and SBA for safety (01/22/2024); 340 feet with BRW and SBA for safety (04/20/2024); 412 feet with BRW and W/C follow for safety (06/15/2024);  Goal status: In progress  4.  Patient will complete 5  Time Sit To Stand Test from 19.5 inch surface or lower in equal or less than 15 seconds with no UE support to demonstrate improved transfer ability for improved household and toileting mobility.  Baseline: 23 seconds with heavy B UE support on RW from  19.5 inch plinth. Pain throbbing down the right LE.  (12/26/22); 19 seconds with heavy B UE support on RW from 19.5 inch plinth (03/05/2023); 20 seconds with heavy B UE support on RRW from 18.5 inch plinth. Painful in back of B calves, thighs, and lower back (04/09/2023); 15 seconds with heavy B UE support on BRW from 18.5 inch plinth. Painful in back of B calves, thighs, and lower back (06/03/2023); 19 seconds with heavy B UE support on BRW from 18.5 inch plinth. Painful in back of B calves, thighs, and lower back (10/08/2023); 18 seconds with heavy B UE support on BRW from 18.5 inch plinth. Painful in back of B calves, thighs, and lower back (11/25/2023); 15seconds with heavy B UE support on BRW from 18.5 inch plinth. Painful in back of B calves, thighs, and lower back (01/22/2024);  14 seconds with heavy B UE support on BRW from 18.5 inch plinth. Painful in back of B calves, thighs, and lower back (04/20/2024); 13 seconds with heavy B UE support on RW and one instance of leaning back of legs on plinth due to LOB backwards (06/15/2024);  Goal status: Ongoing  5.  Patient will report worst pain equal or less than 3/10 with functional activities to improve his ability to complete basic household and community mobility. Baseline: up to 8/10 (12/26/22); reports 3/10 pain (01/31/2023); reports 4/10 pain (02/19/2023); up to 7/10 in the last 2 weeks (03/05/2023): up to  7/10 in the last two weeks (04/09/2023); up to 8/10 in the last 2 weeks (06/03/2023);  up to 9/10 over the last 2 weeks (10/08/2023); up to 7-8/10 in the last 2 weeks (11/25/2023); 7/10 in the last 2 weeks (01/22/2024); better controlled in last 2 weeks but no specific number obtained (03/18/2024); 9/10 with medication  (06/15/2024);  Goal status: Ongoing  6.  Patient will demonstrate improvement in Patient Specific Functional Scale (PSFS) by equal or greater than 3/10 points to reflect clinically significant improvement in patient's most valued functional activities. Baseline: 1.7/10 for Walking Around [address], Enjoying Life, and Driving (97/74/74); 7.2/89 (11/25/2023); 2.7/10 (01/22/24); 2.7/10 (04/20/2024); 2.7/10 (06/15/2024);  Goal status: ongoing  PLAN:  PT FREQUENCY: 1-2x/week PT DURATION: 12 weeks PLANNED INTERVENTIONS: Therapeutic exercises, Therapeutic activity, Neuromuscular re-education, Balance training, Gait training, Patient/Family education, Self Care, Joint mobilization, Stair training, Orthotic/Fit training, DME instructions, Aquatic Therapy, Dry Needling, Electrical stimulation, Wheelchair mobility training, Spinal mobilization, Cryotherapy, Moist heat, Manual therapy, and Re-evaluation.  PLAN FOR NEXT SESSION:  Continue with functional, strength, and mobility training as tolerated.  update HEP as appropriate, LE/core/functional strengthening  and balance as tolerated, education, manual therapy as needed.  Camie SAUNDERS. Juli, PT, DPT, Cert. MDT, PRA-C 08/04/2024, 6:16 PM  [facility] [address] [place], [place] [address] P: [phone] I F: [phone]     "

## 2024-08-11 ENCOUNTER — Ambulatory Visit: Admitting: Physical Therapy

## 2024-08-18 ENCOUNTER — Ambulatory Visit

## 2024-08-20 ENCOUNTER — Ambulatory Visit: Admitting: Physical Therapy

## 2024-08-25 ENCOUNTER — Ambulatory Visit: Admitting: Physical Therapy

## 2024-08-26 ENCOUNTER — Other Ambulatory Visit: Payer: Self-pay | Admitting: Physical Medicine & Rehabilitation

## 2024-08-27 ENCOUNTER — Encounter: Payer: Self-pay | Admitting: Physical Therapy

## 2024-08-27 ENCOUNTER — Ambulatory Visit: Attending: Physical Medicine & Rehabilitation | Admitting: Physical Therapy

## 2024-08-27 DIAGNOSIS — M6281 Muscle weakness (generalized): Secondary | ICD-10-CM | POA: Insufficient documentation

## 2024-08-27 DIAGNOSIS — M5459 Other low back pain: Secondary | ICD-10-CM | POA: Insufficient documentation

## 2024-08-27 DIAGNOSIS — R262 Difficulty in walking, not elsewhere classified: Secondary | ICD-10-CM | POA: Insufficient documentation

## 2024-08-27 DIAGNOSIS — M5416 Radiculopathy, lumbar region: Secondary | ICD-10-CM | POA: Insufficient documentation

## 2024-08-27 DIAGNOSIS — R29818 Other symptoms and signs involving the nervous system: Secondary | ICD-10-CM | POA: Insufficient documentation

## 2024-08-27 DIAGNOSIS — R296 Repeated falls: Secondary | ICD-10-CM | POA: Insufficient documentation

## 2024-08-27 NOTE — Therapy (Signed)
 " OUTPATIENT PHYSICAL THERAPY TREATMENT / PROGRESS NOTE Dates of reporting from 06/15/2024 to 08/27/2024  Patient Name: CHISTIAN KASLER MRN: [id number] DOB:04-Jan-1981, 44 y.o., male Today's Date: 08/27/24   END OF SESSION:  PT End of Session - 08/27/24 1700     Visit Number 96    Number of Visits 108    Date for Recertification  09/07/24    Authorization Type MEDICARE PART B reporting period from 06/15/24    Progress Note Due on Visit 80    PT Start Time 1649    PT Stop Time 1730    PT Time Calculation (min) 41 min    Equipment Utilized During Treatment Gait belt    Activity Tolerance Patient limited by pain;Patient tolerated treatment well    Behavior During Therapy [facility] for tasks assessed/performed                Past Medical History:  Diagnosis Date   Bowel trouble    urgency   Medical history non-contributory    Urinary urgency    Past Surgical History:  Procedure Laterality Date   BACK SURGERY  2010   CIRCUMCISION     LUMBAR LAMINECTOMY/DECOMPRESSION MICRODISCECTOMY  07/20/2011   Procedure: LUMBAR LAMINECTOMY/DECOMPRESSION MICRODISCECTOMY;  Surgeon: Rockey LITTIE Peru;  Location: [facility];  Service: Neurosurgery;  Laterality: N/A;  right thoracotomy with thoracic eight-nine discectomy and fusion   LUMBAR LAMINECTOMY/DECOMPRESSION MICRODISCECTOMY Right 10/06/2021   Procedure: Right Lumbar Four-Five Microdiscectomy, Right Lumbar Five-Sacal One Foraminotomy;  Surgeon: Peru Rockey, MD;  Location: [facility];  Service: Neurosurgery;  Laterality: Right;  3C/RM 21   THORACIC DISCECTOMY  07/16/2012   Procedure: THORACIC DISCECTOMY;  Surgeon: Rockey LITTIE Peru, MD;  Location: [facility];  Service: Neurosurgery;  Laterality: Right;  RIGHT Thoracic seven-eight  thoracic diskectomy via thoracotomy by dr lucas   THORACIC DISCECTOMY N/A 12/15/2014   Procedure: THORACIC SEVEN TO THORACIC NINE Laminectomy ;  Surgeon: Rockey Peru, MD;  Location: [facility];  Service: Neurosurgery;   Laterality: N/A;   THORACIC DISCECTOMY N/A 06/06/2017   Procedure: LAMINECTOMY THORACIC NINE-TEN;  Surgeon: [place] Rockey, MD;  Location: [facility];  Service: Neurosurgery;  Laterality: N/A;  LAMINECTOMY THORACIC NINE-TEN   THORACOTOMY  07/20/2011   Procedure: THORACOTOMY OPEN FOR SPINE SURGERY;  Surgeon: JONETTA Belvie Nam, MD;  Location: [facility];  Service: Vascular;  Laterality: N/A;   THORACOTOMY  07/16/2012   Procedure: THORACOTOMY OPEN FOR SPINE SURGERY;  Surgeon: Dorise MARLA Lucas, MD;  Location: [facility];  Service: Thoracic;  Laterality: N/A;   Patient Active Problem List   Diagnosis Date Noted   Carpal tunnel syndrome on right 02/19/2024   Spasticity 09/25/2023   Nerve pain 12/05/2022   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   S/P lumbar spinal fusion 05/25/2022   Synovial cyst of lumbar facet joint 05/25/2022   HNP (herniated nucleus pulposus), lumbar 10/06/2021   Hand pain 06/28/2021   Lumbar facet arthropathy 01/11/2021   Thoracic spondylosis with myelopathy 01/11/2021   Thoracic spinal stenosis 06/06/2017   Stenosis, spinal, thoracic 12/15/2014   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 09/22/2014   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/2011    PCP: Maryl Sayres  REFERRING PROVIDER: Babs Arthea DASEN, MD  REFERRING DIAG: thoracic disc disease with myelopathy, nerve pain, adhesive arachnoiditis  Rationale for Evaluation and Treatment: Rehabilitation  THERAPY DIAG:  Other low back pain  Other symptoms and signs involving the nervous system  Radiculopathy, lumbar region  Muscle  weakness (generalized)  Repeated falls  Difficulty in walking, not elsewhere classified  ONSET DATE:  Suddenly started having weakness 11 years ago, most recent episode of worsening October 2022, s/p right L4-5 laminotomy, microdiscectomy on 10/06/2021, now s/p L4-5 PLIF by Dr. Gillie on 05/25/2022.  PERTINENT HISTORY:  Patient is a 43 y.o. male who  presents to outpatient physical therapy with a referral for medical diagnosis thoracic disc disease with myelopathy, nerve pain, adhesive arachnoiditis. This patient's chief complaints consist of disabling low back and R >L leg pain and weakness with activity, bowel and bladder urgency, incontinence, and retention, leading to the following functional deficits: difficulty with or unable to complete any activity that requires weight bearing, use of B LE, and/or balance including working, household and community mobility, walking, driving, going to family gatherings, avoiding incontinent episodes, playing with daughter, helping around the house, bed mobility, transfers. 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 Gillie on 05/25/2022, adhesive arachnoiditis, nerve pain.  Patient denies hx of cancer, stroke, seizures, lung problems, heart problems, diabetes, unexplained weight loss, and osteoporosis.  SUBJECTIVE:                                                                                                                                                                                           SUBJECTIVE STATEMENT:     Patient arrives with BRW. Patient states one morning he just woke up and had nausea and a lot of symptoms like when he was previously hospitalized. He was like that for about 3-4 days. After that whenever he would walk, even with the rollator, he would fall. He tried to go outside one day and he fell. He thinks he has fallen 8 times in the last 2.5 weeks. He scraped up his left  knee and his back by his right scapula. He last fell on Monday. He feels like the weakness has improved but he is still dragging his L leg when he is walking. The falls happen when he gets sudden spasm with pain in the right hip and low back and his legs buckle. It was worse when he had to urinate or defecate, to the point where he couldn't stand if he needed to go to the bathroom. He has not been constipated. He has not  contacted Dr. Janice about this. HE was trying to wait for his neurologist appointment, but the appointment was scheduled for September. He has been doing bed exercises. He has mostly been in the bed, but this time he could get out of the bed but every time he put pressure on his leg or gets to terminal stance his back would spasm and he would collapse. He states it feels like something pinches when he is walking. He plans to talk to Dr. Lane.   PAIN:  NPRS: 4/10 in the low back, R groin and hip, down back of both legs, right calf and shin.   PRECAUTIONS: Fall  PATIENT GOALS: To get better, walk, drive again, climb stairs, play with my daughter, help around the house again  OBJECTIVE  SELF-REPORTED FUNCTION Patient Specific Functional Scale (PSFS)  Walking Around People: 1/10 Enjoying Life: 1/10 Driving: 9/89 Average: 9.2/89   FUNCTIONAL/BALANCE TESTS:   Five Time Sit to Stand (5TSTS):  Purpose: to assess LE functional strength and power for functional mobility.  Score: 13 seconds from 18.5 inch plinth heavy B UE support on BRW. Analysis: patient scored significantly worse than the 7.61.8 sec that is the norm for healthy males of his age and had the same as at his last PT progress note. He also required heavy UE support while test norms assume UE crossed over chest. This demonstrates a significantly worse LE strength and power than would be expected for his age group and is actually worse than the norm for 49-27 year old community dwelling adults, which is 10.6 + 3.4  seconds. His score was better than his baseline of 23 seconds at the start of current episode of care.     6 Minute Walk Test:  Purpose: to assess function with community mobility Score: 156 feet with BRW and close W/C follow.  Test concluded early with 2:43 min remaining due to low back pain and pain in the back of his B thighs and R calf with progressive LE weakness that was causing L knee instability and dragging of R > L feet. Pain up to 6/10.   Analysis: patient ambulated significantly less than 1890 feet which is the norm for healthy males of his age, and he ambulated almost 250 feet less distance than at last progress note on 06/15/2024 and he needed a wheelchair follow today due to risk of buckling. He continues to be limited by low back pain, R hip pain, and neurologic pain and weakness in B LE with weight bearing activities.   TREATMENT  Physical Performance Test or Measurement: a physical performance test or measurement with written report. and 5TSTS test (See above)   Therapeutic activities: dynamic therapeutic activities incorporating MULTIPLE parameters or areas of the body designed to achieve improved functional performance.  UE supported squats at squat cage (holding on to BB to support bodyweight and offload lumbar spine and LE). Chair placed behind, facing outwards, RW in front, BB moved up to top hole. CGA from behind with gait belt around waist, ready to keep pt from shifting forwards out of chair if he experiences a sudden back spasm that makes his legs buckle.  1x5 1x6 1x5 1 min rest between sets Spasm after set when he lets go of bar   Ambulation approx 100 feet down ramp and off curb from clinic to vehicle with BRW with W/C follow and SBA-min A. Pt experienced LOB that required min A to prevent fall.   Therapeutic exercise: therapeutic exercises that  incorporate ONE parameter at one or more areas of the body to centralize symptoms, develop strength and endurance,  range of motion, and flexibility required for successful completion of functional activities.  Seated overhead pull down (B shoulder extension from ~120 degrees flexion to bar on thighs) with cable bar to improve core strength 2x20 at 35# 1 minute rest between Discontinued due to worsening R leg pain  PATIENT EDUCATION:  Education details: Exercise purpose/form. Self management techniques. POC.  Person educated: patient Education method: Explanation Education comprehension: verbalized understanding and needs further education  HOME EXERCISE PROGRAM: Verbally:  - seated lumbar flexion stretch with head down and one LE extended using rollator, 1x10 each side.  - curl up with legs elevated, 5x15 seconds - sidelying open book 1x10 each side  Access Code: [email or web address] URL: [email or web address] Date: 12/18/2023 Prepared by: Camie Cleverly  Exercises - Seated Diaphragmatic Breathing  - 3 reps - 1 minute practice time - Seated Pelvic Floor Elevators  - 3 x daily - 2 sets - 10 reps - Seated Ankle Plantar Flexion with Resistance Loop  - 1 x daily - 3 sets - 20 reps - Seated Ankle Dorsiflexion AROM  - 1 x daily - 3 sets - 10-30 reps - Seated Ankle Inversion AROM  - 1 x daily - 3 sets - 10-30 reps - Seated Ankle Eversion AROM  - 1 x daily - 3 sets - 10-30 reps - Sidelying Ankle Eversion Strengthening with Ankle Weight  - 1 x daily - 3 sets - 20 reps - Sidelying Ankle Inversion with Ankle Weight  - 1 x daily - 3 sets - 20 reps - Seated Ankle Dorsiflexion with Ankle Weight  - 1 x daily - 3 sets - 20 reps  ASSESSMENT:  CLINICAL IMPRESSION:     Patient's goals re-assessed today after pt returns to PT 3.5 weeks since last session reporting significant decline in function and increased symptoms including increased bowel and bladder symptoms and 8 falls in the last 2.5 weeks with skin injuries. Patient was able to ambulate significantly less on the than previously (250 feet less) and  demonstrated knee instability and dragging of feet due to neurologic weakness. He also had an instance of imbalance on his way out to the vehicle that required min A to correct to prevent a fall. A W/C follow was used throughout the clinic due to increased fall risk. He also had decreased tolerance for exercises due to back and leg pain with increased weakness, able to do less than half the UE supported squats he usually does. His 5TSTS test was similar to last progress note, but he uses his arms heavily for this on the walker and likely does not accurately reflect his LE strength. Patient returned to PT because he was improving enough to get there, but has not been out of the house for weeks due to his worsened function. Plan to continue with PT as tolerated. Pt has not discussed his worsening or increased symptoms with referring physician and this will be forwarded to them today. Patient would benefit from continued management of limiting condition by skilled physical therapist to address remaining impairments and functional limitations to work towards stated goals and return to PLOF or maximal functional independence.    OBJECTIVE IMPAIRMENTS: Abnormal gait, decreased activity tolerance, decreased balance, decreased coordination, decreased endurance, decreased knowledge of condition, decreased knowledge of use of DME, decreased mobility, difficulty walking, decreased ROM, decreased strength, increased edema, impaired perceived functional ability, increased muscle spasms,  impaired flexibility, impaired sensation, impaired tone, improper body mechanics, postural dysfunction, obesity, and pain.   ACTIVITY LIMITATIONS: carrying, lifting, bending, standing, squatting, stairs, transfers, bed mobility, continence, bathing, toileting, dressing, hygiene/grooming, locomotion level, and caring for others  PARTICIPATION LIMITATIONS: meal prep, cleaning, laundry, interpersonal relationship, driving, shopping, community  activity, occupation, yard work, and  difficulty with or unable to complete any activity that requires weight bearing, use of B LE, and/or balance including working, household and community mobility, walking, driving, going to family gatherings, avoiding incontinent episodes, playing with daughter, helping around the house, bed mobility, transfers  PERSONAL FACTORS: Past/current experiences, Time since onset of injury/illness/exacerbation, and 3+ comorbidities:  3 thoracic spine surgeries, thoracic MRI notes 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 Gillie on 05/25/2022, adhesive arachnoiditis, nerve pain are also affecting patient's functional outcome.   REHAB POTENTIAL: Fair due to severity and nature of condition.   CLINICAL DECISION MAKING: Evolving/moderate complexity  EVALUATION COMPLEXITY: Moderate   GOALS: Goals reviewed with patient? No  SHORT TERM GOALS: Target date: 01/09/2023. Target date updated to 05/28/2023 for all unmet goals on 03/05/2023.   Patient will be independent with initial home exercise program for self-management of symptoms. Baseline: Initial HEP to be provided at visit 2 as appropriate (12/26/22); Goal status: MET  LONG TERM GOALS: Target date: 03/20/2023. Target date updated to 08/26/23  for all unmet goals on 06/03/2023. Target date updated to 11/20/2023 for all  unmet goals on 08/28/2023. Target date updated to 02/17/2024  for all unmet goals on 11/25/2023. Target date updated to 04/14/2024 for all unmet goals on 01/22/2024. Updated to 07/13/2024 for all unmet goals on 04/20/2024. Updated to 09/07/2024 for all unmet goals on 06/15/2024.   Patient will be  independent with a long-term home exercise program for self-management of symptoms.  Baseline: Initial HEP to be provided at visit 2 as appropriate (12/26/22); participating as able (01/31/2023); patient currently participating as tolerated (03/05/2023; 04/09/2023; 06/03/2023); participates daily in mat exercises and in weight bearing exercises as tolerated (10/08/2023; 11/25/2023); Tries to do daily but sometimes cannot when his body won't let him (01/22/2024); participating as able including activities he finds way to mimic at home that he learns in clinic (03/17/2024); continues to complete exercises as tolerated (04/20/2024); continues to complete HEP as able (06/15/24); able to do bed and seated ankle exercises (08/27/24);  Goal status: In-progress  2.  Patient will demonstrate improved FOTO by equal or greater than 10 points by visit #13 to demonstrate improvement in overall condition and self-reported functional ability.  Baseline: to be tested visit 2 as appropriate (12/26/22); 30 at visit #3 (01/02/2023); 46 at visit #13 (03/05/2023); 45 at visit #29 (06/03/2023); 49 at visit #50 (10/08/2023);  Goal status: MET  3.  Patient will demonstrate the ability to ambulate equal or greater than 600 feet with LRAD during the 6 minute walk to improve his household and community mobility.  Baseline: 34 feet with bari-RW (12/26/22); 200 feet with bari-RW (01/31/2023); 500 feet with BRW (03/05/2023); 400 feet with BRW (04/09/2023); 152 feet with BRW and W/C follow for safety (06/03/2023); 330 feet with BRW (10/08/2023); 245 feet with BRW and SBA for safety (11/25/2023); 343 feet with BRW and SBA for safety (  01/22/2024); 340 feet with BRW and SBA for safety (04/20/2024); 412 feet with BRW and W/C follow for safety (06/15/2024); 156 feet with BRW and close W/C follow 08/27/24); Goal status: In progress  4.  Patient will complete 5 Time Sit To Stand Test from 19.5 inch surface or lower in equal or less than 15 seconds with no UE support  to demonstrate improved transfer ability for improved household and toileting mobility.  Baseline: 23 seconds with heavy B UE support on RW from 19.5 inch plinth. Pain throbbing down the right LE.  (12/26/22); 19 seconds with heavy B UE support on RW from 19.5 inch plinth (03/05/2023); 20 seconds with heavy B UE support on RRW from 18.5 inch plinth. Painful in back of B calves, thighs, and lower back (04/09/2023); 15 seconds with heavy B UE support on BRW from 18.5 inch plinth. Painful in back of B calves, thighs, and lower back (06/03/2023); 19 seconds with heavy B UE support on BRW from 18.5 inch plinth. Painful in back of B calves, thighs, and lower back (10/08/2023); 18 seconds with heavy B UE support on BRW from 18.5 inch plinth. Painful in back of B calves, thighs, and lower back (11/25/2023); 15seconds with heavy B UE support on BRW from 18.5 inch plinth. Painful in back of B calves, thighs, and lower back (01/22/2024);  14 seconds with heavy B UE support on BRW from 18.5 inch plinth. Painful in back of B calves, thighs, and lower back (04/20/2024); 13 seconds with heavy B UE support on RW and one instance of leaning back of legs on plinth due to LOB backwards (06/15/2024); 13 seconds with heavy B UE support on RW (08/27/24); Goal status: Ongoing  5.  Patient will report worst pain equal or less than 3/10 with functional activities to improve his ability to complete basic household and community mobility. Baseline: up to 8/10 (12/26/22); reports 3/10 pain (01/31/2023); reports 4/10 pain (02/19/2023); up to 7/10 in the last 2 weeks (03/05/2023): up to  7/10 in the last two weeks (04/09/2023); up to 8/10 in the last 2 weeks (06/03/2023);  up to 9/10 over the last 2 weeks (10/08/2023); up to 7-8/10 in the last 2 weeks (11/25/2023); 7/10 in the last 2 weeks (01/22/2024); better controlled in last 2 weeks but no specific number obtained (03/18/2024); 9/10 with medication (06/15/2024); 9/10 in the last two weeks (08/27/24);  Goal  status: Ongoing  6.  Patient will demonstrate improvement in Patient Specific Functional Scale (PSFS) by equal or greater than 3/10 points to reflect clinically significant improvement in patient's most valued functional activities. Baseline: 1.7/10 for Walking Around [address], Enjoying Life, and Driving (97/74/74); 7.2/89 (11/25/2023); 2.7/10 (01/22/24); 2.7/10 (04/20/2024); 2.7/10 (06/15/2024); 0.7/10 (08/27/24); Goal status: ongoing  PLAN:  PT FREQUENCY: 1-2x/week PT DURATION: 12 weeks PLANNED INTERVENTIONS: Therapeutic exercises, Therapeutic activity, Neuromuscular re-education, Balance training, Gait training, Patient/Family education, Self Care, Joint mobilization, Stair training, Orthotic/Fit training, DME instructions, Aquatic Therapy, Dry Needling, Electrical stimulation, Wheelchair mobility training, Spinal mobilization, Cryotherapy, Moist heat, Manual therapy, and Re-evaluation.  PLAN FOR NEXT SESSION:  Continue with functional, strength, and mobility training as tolerated.  update HEP as appropriate, LE/core/functional strengthening  and balance as tolerated, education, manual therapy as needed.  Camie SAUNDERS. Juli, PT, DPT, Cert. MDT, PRA-C 08/27/24, 7:46 PM  [facility] [address] [place], [place] [address] P: [phone] I F: [phone]     "

## 2024-08-28 ENCOUNTER — Other Ambulatory Visit: Payer: Self-pay | Admitting: Physical Medicine & Rehabilitation

## 2024-09-01 ENCOUNTER — Other Ambulatory Visit: Payer: Self-pay | Admitting: Physical Medicine & Rehabilitation

## 2024-09-01 ENCOUNTER — Ambulatory Visit: Admitting: Physical Therapy

## 2024-09-01 DIAGNOSIS — M5459 Other low back pain: Secondary | ICD-10-CM

## 2024-09-01 DIAGNOSIS — R262 Difficulty in walking, not elsewhere classified: Secondary | ICD-10-CM

## 2024-09-01 DIAGNOSIS — M6281 Muscle weakness (generalized): Secondary | ICD-10-CM

## 2024-09-01 DIAGNOSIS — M5104 Intervertebral disc disorders with myelopathy, thoracic region: Secondary | ICD-10-CM

## 2024-09-01 DIAGNOSIS — G039 Meningitis, unspecified: Secondary | ICD-10-CM

## 2024-09-01 DIAGNOSIS — R296 Repeated falls: Secondary | ICD-10-CM

## 2024-09-01 DIAGNOSIS — M792 Neuralgia and neuritis, unspecified: Secondary | ICD-10-CM

## 2024-09-01 DIAGNOSIS — R29818 Other symptoms and signs involving the nervous system: Secondary | ICD-10-CM

## 2024-09-01 DIAGNOSIS — M5416 Radiculopathy, lumbar region: Secondary | ICD-10-CM

## 2024-09-01 NOTE — Therapy (Unsigned)
 " OUTPATIENT PHYSICAL THERAPY TREATMENT  Patient Name: Travis Fitzpatrick MRN: [id number] DOB:01/27/1981, 44 y.o., male Today's Date: 09/02/24   END OF SESSION:  PT End of Session - 09/02/24 0811     Visit Number 97    Number of Visits 108    Date for Recertification  09/07/24    Authorization Type MEDICARE PART B reporting period from 06/15/24    Progress Note Due on Visit 80    PT Start Time 1652    PT Stop Time 1730    PT Time Calculation (min) 38 min    Equipment Utilized During Treatment Gait belt    Activity Tolerance Patient limited by pain;Patient tolerated treatment well    Behavior During Therapy [facility] for tasks assessed/performed            Past Medical History:  Diagnosis Date   Bowel trouble    urgency   Medical history non-contributory    Urinary urgency    Past Surgical History:  Procedure Laterality Date   BACK SURGERY  2010   CIRCUMCISION     LUMBAR LAMINECTOMY/DECOMPRESSION MICRODISCECTOMY  07/20/2011   Procedure: LUMBAR LAMINECTOMY/DECOMPRESSION MICRODISCECTOMY;  Surgeon: Rockey LITTIE Peru;  Location: [facility];  Service: Neurosurgery;  Laterality: N/A;  right thoracotomy with thoracic eight-nine discectomy and fusion   LUMBAR LAMINECTOMY/DECOMPRESSION MICRODISCECTOMY Right 10/06/2021   Procedure: Right Lumbar Four-Five Microdiscectomy, Right Lumbar Five-Sacal One Foraminotomy;  Surgeon: Peru Rockey, MD;  Location: [facility];  Service: Neurosurgery;  Laterality: Right;  3C/RM 21   THORACIC DISCECTOMY  07/16/2012   Procedure: THORACIC DISCECTOMY;  Surgeon: Rockey LITTIE Peru, MD;  Location: [facility];  Service: Neurosurgery;  Laterality: Right;  RIGHT Thoracic seven-eight  thoracic diskectomy via thoracotomy by dr lucas   THORACIC DISCECTOMY N/A 12/15/2014   Procedure: THORACIC SEVEN TO THORACIC NINE Laminectomy ;  Surgeon: Rockey Peru, MD;  Location: [facility];  Service: Neurosurgery;  Laterality: N/A;   THORACIC DISCECTOMY N/A 06/06/2017   Procedure:  LAMINECTOMY THORACIC NINE-TEN;  Surgeon: [place] Rockey, MD;  Location: [facility];  Service: Neurosurgery;  Laterality: N/A;  LAMINECTOMY THORACIC NINE-TEN   THORACOTOMY  07/20/2011   Procedure: THORACOTOMY OPEN FOR SPINE SURGERY;  Surgeon: JONETTA Belvie Nam, MD;  Location: [facility];  Service: Vascular;  Laterality: N/A;   THORACOTOMY  07/16/2012   Procedure: THORACOTOMY OPEN FOR SPINE SURGERY;  Surgeon: Dorise MARLA Lucas, MD;  Location: [facility];  Service: Thoracic;  Laterality: N/A;   Patient Active Problem List   Diagnosis Date Noted   Carpal tunnel syndrome on right 02/19/2024   Spasticity 09/25/2023   Nerve pain 12/05/2022   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   S/P lumbar spinal fusion 05/25/2022   Synovial cyst of lumbar facet joint 05/25/2022   HNP (herniated nucleus pulposus), lumbar 10/06/2021   Hand pain 06/28/2021   Lumbar facet arthropathy 01/11/2021   Thoracic spondylosis with myelopathy 01/11/2021   Thoracic spinal stenosis 06/06/2017   Stenosis, spinal, thoracic 12/15/2014   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 09/22/2014   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/2011    PCP: Maryl Sayres  REFERRING PROVIDER: Babs Arthea DASEN, MD  REFERRING DIAG: thoracic disc disease with myelopathy, nerve pain, adhesive arachnoiditis  Rationale for Evaluation and Treatment: Rehabilitation  THERAPY DIAG:  Other low back pain  Other symptoms and signs involving the nervous system  Radiculopathy, lumbar region  Muscle weakness (generalized)  Repeated falls  Difficulty in walking, not elsewhere classified  ONSET  DATE:  Suddenly started having weakness 11 years ago, most recent episode of worsening October 2022, s/p right L4-5 laminotomy, microdiscectomy on 10/06/2021, now s/p L4-5 PLIF by Dr. Gillie on 05/25/2022.  PERTINENT HISTORY:  Patient is a 44 y.o. male who presents to outpatient physical therapy with a referral for medical  diagnosis thoracic disc disease with myelopathy, nerve pain, adhesive arachnoiditis. This patient's chief complaints consist of disabling low back and R >L leg pain and weakness with activity, bowel and bladder urgency, incontinence, and retention, leading to the following functional deficits: difficulty with or unable to complete any activity that requires weight bearing, use of B LE, and/or balance including working, household and community mobility, walking, driving, going to family gatherings, avoiding incontinent episodes, playing with daughter, helping around the house, bed mobility, transfers. 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 Gillie on 05/25/2022, adhesive arachnoiditis, nerve pain.  Patient denies hx of cancer, stroke, seizures, lung problems, heart problems, diabetes, unexplained weight loss, and osteoporosis.  SUBJECTIVE:                                                                                                                                                                                           SUBJECTIVE STATEMENT:     Patient arrives with BRW. He states he is feeling much better and is not sure why. He has not had any falls since last PT session. He reached out to both of the neurologists and has not heard back from any of them.   PAIN:  NPRS: 2/10 in the low back, R groin and hip, down back of both legs, right calf and shin.   PRECAUTIONS: Fall  PATIENT GOALS: To get better, walk, drive again, climb stairs, play with my daughter, help around the house  again  OBJECTIVE  TREATMENT  Therapeutic activities: dynamic therapeutic activities incorporating MULTIPLE parameters or areas of the body designed to achieve improved functional performance.  UE supported squats at squat cage (holding on to BB to support bodyweight and offload lumbar spine and LE). Chair placed behind, facing outwards, RW in front, BB moved up to top hole. CGA from behind with gait belt around waist, ready to keep pt from shifting forwards out of chair if he experiences a sudden back spasm that  makes his legs buckle.  6x7 1 min rest between sets  Seated dorsiflexion to improve ability to keep toe up when taking steps during walking R:  1x20 AROM 1x20 with 3#AW over forefoot L:  2x15 AROM   Ambulation approx 50 feet with BRW and SBA from clinic to bench outside clinic.   Therapeutic exercise: therapeutic exercises that incorporate ONE parameter at one or more areas of the body to centralize symptoms, develop strength and endurance, range of motion, and flexibility required for successful completion of functional activities.  Seated quad extension on OMEGA machine, seat position 3 B LE 1x5 at 35# 3x7 at 35# 1 min rests between sets   Seated overhead pull down (B shoulder extension from ~120 degrees flexion to bar on thighs) with cable bar to improve core strength 2x20 at 35# 1 minute rest between Discontinued due to worsening R leg pain and light headed.   PATIENT EDUCATION:  Education details: Exercise purpose/form. Self management techniques. POC.  Person educated: patient Education method: Explanation Education comprehension: verbalized understanding and needs further education  HOME EXERCISE PROGRAM: Verbally:  - seated lumbar flexion stretch with head down and one LE extended using rollator, 1x10 each side.  - curl up with legs elevated, 5x15 seconds - sidelying open book 1x10 each side  Access Code: [email or web address] URL:  [email or web address] Date: 12/18/2023 Prepared by: Camie Cleverly  Exercises - Seated Diaphragmatic Breathing  - 3 reps - 1 minute practice time - Seated Pelvic Floor Elevators  - 3 x daily - 2 sets - 10 reps - Seated Ankle Plantar Flexion with Resistance Loop  - 1 x daily - 3 sets - 20 reps - Seated Ankle Dorsiflexion AROM  - 1 x daily - 3 sets - 10-30 reps - Seated Ankle Inversion AROM  - 1 x daily - 3 sets - 10-30 reps - Seated Ankle Eversion AROM  - 1 x daily - 3 sets - 10-30 reps - Sidelying Ankle Eversion Strengthening with Ankle Weight  - 1 x daily - 3 sets - 20 reps - Sidelying Ankle Inversion with Ankle Weight  - 1 x daily - 3 sets - 20 reps - Seated Ankle Dorsiflexion with Ankle Weight  - 1 x daily - 3 sets - 20 reps  ASSESSMENT:  CLINICAL IMPRESSION:     Patient feeling much better compared to last PT session but did get some lightheadedness at the end of the session when doing pull downs. He was able to get back to more strength and functional training but continues to have significant limitations due to pain and neurologic weakness. He continued to need guarding and assistance with completing exercises. Patient would benefit from continued management of limiting condition by skilled physical therapist to address remaining impairments and functional limitations to work towards stated goals and return to PLOF or maximal functional independence.   OBJECTIVE IMPAIRMENTS: Abnormal gait, decreased activity tolerance, decreased balance, decreased coordination, decreased endurance, decreased knowledge of condition, decreased knowledge of use of DME, decreased mobility, difficulty walking, decreased ROM, decreased strength, increased edema, impaired perceived functional ability, increased muscle spasms, impaired flexibility, impaired sensation, impaired tone, improper body mechanics, postural dysfunction, obesity, and pain.   ACTIVITY LIMITATIONS: carrying, lifting, bending,  standing, squatting, stairs, transfers, bed mobility, continence, bathing, toileting, dressing, hygiene/grooming, locomotion level, and caring for others  PARTICIPATION LIMITATIONS: meal prep, cleaning, laundry, interpersonal relationship, driving, shopping, community activity, occupation, yard work, and  difficulty with or unable to complete any activity that requires weight bearing,  use of B LE, and/or balance including working, household and community mobility, walking, driving, going to family gatherings, avoiding incontinent episodes, playing with daughter, helping around the house, bed mobility, transfers  PERSONAL FACTORS: Past/current experiences, Time since onset of injury/illness/exacerbation, and 3+ comorbidities:  3 thoracic spine surgeries, thoracic MRI notes 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 Gillie on 05/25/2022, adhesive arachnoiditis, nerve pain are also affecting patient's functional outcome.   REHAB POTENTIAL: Fair due to severity and nature of condition.   CLINICAL DECISION MAKING: Evolving/moderate complexity  EVALUATION COMPLEXITY: Moderate   GOALS: Goals reviewed with patient? No  SHORT TERM GOALS: Target date: 01/09/2023. Target date updated to 05/28/2023 for all unmet goals on 03/05/2023.   Patient will be independent with initial home exercise program for self-management of symptoms. Baseline: Initial HEP to be provided at visit 2 as appropriate (12/26/22); Goal status: MET  LONG TERM GOALS: Target date: 03/20/2023. Target date updated to 08/26/23  for all unmet goals on 06/03/2023. Target date updated to 11/20/2023 for all  unmet goals on 08/28/2023.  Target date updated to 02/17/2024  for all unmet goals on 11/25/2023. Target date updated to 04/14/2024 for all unmet goals on 01/22/2024. Updated to 07/13/2024 for all unmet goals on 04/20/2024. Updated to 09/07/2024 for all unmet goals on 06/15/2024.   Patient will be independent with a long-term home exercise program for self-management of symptoms.  Baseline: Initial HEP to be provided at visit 2 as appropriate (12/26/22); participating as able (01/31/2023); patient currently participating as tolerated (03/05/2023; 04/09/2023; 06/03/2023); participates daily in mat exercises and in weight bearing exercises as tolerated (10/08/2023; 11/25/2023); Tries to do daily but sometimes cannot when his body won't let him (01/22/2024); participating as able including activities he finds way to mimic at home that he learns in clinic (03/17/2024); continues to complete exercises as tolerated (04/20/2024); continues to complete HEP as able (06/15/24); able to do bed and seated ankle exercises (08/27/24);  Goal status: In-progress  2.  Patient will demonstrate improved FOTO by equal or greater than 10 points by visit #13 to demonstrate improvement in overall condition and self-reported functional ability.  Baseline: to be tested visit 2 as appropriate (12/26/22); 30 at visit #3 (01/02/2023); 46 at visit #13 (03/05/2023); 45 at visit #29 (06/03/2023); 49 at visit #50 (10/08/2023);  Goal status: MET  3.  Patient will demonstrate the ability to ambulate equal or greater than 600 feet with LRAD during the 6 minute walk to improve his household and community mobility.  Baseline: 34 feet with bari-RW (12/26/22); 200 feet with bari-RW (01/31/2023); 500 feet with BRW (03/05/2023); 400 feet with BRW (04/09/2023); 152 feet with BRW and W/C follow for safety (06/03/2023); 330 feet with BRW (10/08/2023); 245 feet with BRW and SBA for safety (11/25/2023); 343 feet with BRW and SBA for safety (01/22/2024); 340 feet with BRW and SBA for safety (04/20/2024); 412  feet with BRW and W/C follow for safety (06/15/2024); 156 feet with BRW and close W/C follow 08/27/24); Goal status: In progress  4.  Patient will complete 5 Time Sit To Stand Test from 19.5 inch surface or lower in equal or less than 15 seconds with no UE support to demonstrate improved transfer ability for  improved household and toileting mobility.  Baseline: 23 seconds with heavy B UE support on RW from 19.5 inch plinth. Pain throbbing down the right LE.  (12/26/22); 19 seconds with heavy B UE support on RW from 19.5 inch plinth (03/05/2023); 20 seconds with heavy B UE support on RRW from 18.5 inch plinth. Painful in back of B calves, thighs, and lower back (04/09/2023); 15 seconds with heavy B UE support on BRW from 18.5 inch plinth. Painful in back of B calves, thighs, and lower back (06/03/2023); 19 seconds with heavy B UE support on BRW from 18.5 inch plinth. Painful in back of B calves, thighs, and lower back (10/08/2023); 18 seconds with heavy B UE support on BRW from 18.5 inch plinth. Painful in back of B calves, thighs, and lower back (11/25/2023); 15seconds with heavy B UE support on BRW from 18.5 inch plinth. Painful in back of B calves, thighs, and lower back (01/22/2024);  14 seconds with heavy B UE support on BRW from 18.5 inch plinth. Painful in back of B calves, thighs, and lower back (04/20/2024); 13 seconds with heavy B UE support on RW and one instance of leaning back of legs on plinth due to LOB backwards (06/15/2024); 13 seconds with heavy B UE support on RW (08/27/24); Goal status: Ongoing  5.  Patient will report worst pain equal or less than 3/10 with functional activities to improve his ability to complete basic household and community mobility. Baseline: up to 8/10 (12/26/22); reports 3/10 pain (01/31/2023); reports 4/10 pain (02/19/2023); up to 7/10 in the last 2 weeks (03/05/2023): up to  7/10 in the last two weeks (04/09/2023); up to 8/10 in the last 2 weeks (06/03/2023);  up to 9/10 over the  last 2 weeks (10/08/2023); up to 7-8/10 in the last 2 weeks (11/25/2023); 7/10 in the last 2 weeks (01/22/2024); better controlled in last 2 weeks but no specific number obtained (03/18/2024); 9/10 with medication (06/15/2024); 9/10 in the last two weeks (08/27/24);  Goal status: Ongoing  6.  Patient will demonstrate improvement in Patient Specific Functional Scale (PSFS) by equal or greater than 3/10 points to reflect clinically significant improvement in patient's most valued functional activities. Baseline: 1.7/10 for Walking Around [address], Enjoying Life, and Driving (97/74/74); 7.2/89 (11/25/2023); 2.7/10 (01/22/24); 2.7/10 (04/20/2024); 2.7/10 (06/15/2024); 0.7/10 (08/27/24); Goal status: ongoing  PLAN:  PT FREQUENCY: 1-2x/week PT DURATION: 12 weeks PLANNED INTERVENTIONS: Therapeutic exercises, Therapeutic activity, Neuromuscular re-education, Balance training, Gait training, Patient/Family education, Self Care, Joint mobilization, Stair training, Orthotic/Fit training, DME instructions, Aquatic Therapy, Dry Needling, Electrical stimulation, Wheelchair mobility training, Spinal mobilization, Cryotherapy, Moist heat, Manual therapy, and Re-evaluation.  PLAN FOR NEXT SESSION:  Continue with functional, strength, and mobility training as tolerated.  update HEP as appropriate, LE/core/functional strengthening  and balance as tolerated, education, manual therapy as needed.  Camie SAUNDERS. Juli, PT, DPT, Cert. MDT, PRA-C 09/02/24, 8:15 AM  [facility] [address] [place], [place] [address] P: [phone] I F: [phone]     "

## 2024-09-02 ENCOUNTER — Encounter: Payer: Self-pay | Admitting: Physical Therapy

## 2024-09-03 ENCOUNTER — Ambulatory Visit: Admitting: Physical Therapy

## 2024-09-03 ENCOUNTER — Encounter: Payer: Self-pay | Admitting: Physical Therapy

## 2024-09-03 DIAGNOSIS — R29818 Other symptoms and signs involving the nervous system: Secondary | ICD-10-CM

## 2024-09-03 DIAGNOSIS — R262 Difficulty in walking, not elsewhere classified: Secondary | ICD-10-CM

## 2024-09-03 DIAGNOSIS — R296 Repeated falls: Secondary | ICD-10-CM

## 2024-09-03 DIAGNOSIS — M5416 Radiculopathy, lumbar region: Secondary | ICD-10-CM

## 2024-09-03 DIAGNOSIS — M5459 Other low back pain: Secondary | ICD-10-CM

## 2024-09-03 DIAGNOSIS — M6281 Muscle weakness (generalized): Secondary | ICD-10-CM

## 2024-09-03 NOTE — Therapy (Unsigned)
 " OUTPATIENT PHYSICAL THERAPY TREATMENT  Patient Name: Travis Fitzpatrick MRN: [id number] DOB:Mar 02, 1981, 44 y.o., male Today's Date: 09/03/24   END OF SESSION:  PT End of Session - 09/03/24 1649     Visit Number 98    Number of Visits 108    Date for Recertification  09/07/24    Authorization Type MEDICARE PART B reporting period from 06/15/24    Progress Note Due on Visit 80    PT Start Time 1651    PT Stop Time 1730    PT Time Calculation (min) 39 min    Equipment Utilized During Treatment Gait belt    Activity Tolerance Patient tolerated treatment well;No increased pain;Patient limited by fatigue    Behavior During Therapy [facility] for tasks assessed/performed             Past Medical History:  Diagnosis Date   Bowel trouble    urgency   Medical history non-contributory    Urinary urgency    Past Surgical History:  Procedure Laterality Date   BACK SURGERY  2010   CIRCUMCISION     LUMBAR LAMINECTOMY/DECOMPRESSION MICRODISCECTOMY  07/20/2011   Procedure: LUMBAR LAMINECTOMY/DECOMPRESSION MICRODISCECTOMY;  Surgeon: Rockey LITTIE Peru;  Location: [facility];  Service: Neurosurgery;  Laterality: N/A;  right thoracotomy with thoracic eight-nine discectomy and fusion   LUMBAR LAMINECTOMY/DECOMPRESSION MICRODISCECTOMY Right 10/06/2021   Procedure: Right Lumbar Four-Five Microdiscectomy, Right Lumbar Five-Sacal One Foraminotomy;  Surgeon: Peru Rockey, MD;  Location: [facility] OR;  Service: Neurosurgery;  Laterality: Right;  3C/RM 21   THORACIC DISCECTOMY  07/16/2012   Procedure: THORACIC DISCECTOMY;  Surgeon: Rockey LITTIE Peru, MD;  Location: [facility];  Service: Neurosurgery;  Laterality: Right;  RIGHT Thoracic seven-eight  thoracic diskectomy via thoracotomy by dr lucas   THORACIC DISCECTOMY N/A 12/15/2014   Procedure: THORACIC SEVEN TO THORACIC NINE Laminectomy ;  Surgeon: Rockey Peru, MD;  Location: [facility];  Service: Neurosurgery;  Laterality: N/A;   THORACIC DISCECTOMY N/A 06/06/2017    Procedure: LAMINECTOMY THORACIC NINE-TEN;  Surgeon: [place] Rockey, MD;  Location: [facility];  Service: Neurosurgery;  Laterality: N/A;  LAMINECTOMY THORACIC NINE-TEN   THORACOTOMY  07/20/2011   Procedure: THORACOTOMY OPEN FOR SPINE SURGERY;  Surgeon: JONETTA Belvie Nam, MD;  Location: [facility];  Service: Vascular;  Laterality: N/A;   THORACOTOMY  07/16/2012   Procedure: THORACOTOMY OPEN FOR SPINE SURGERY;  Surgeon: Dorise MARLA Lucas, MD;  Location: [facility];  Service: Thoracic;  Laterality: N/A;   Patient Active Problem List   Diagnosis Date Noted   Carpal tunnel syndrome on right 02/19/2024   Spasticity 09/25/2023   Nerve pain 12/05/2022   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   S/P lumbar spinal fusion 05/25/2022   Synovial cyst of lumbar facet joint 05/25/2022   HNP (herniated nucleus pulposus), lumbar 10/06/2021   Hand pain 06/28/2021   Lumbar facet arthropathy 01/11/2021   Thoracic spondylosis with myelopathy 01/11/2021   Thoracic spinal stenosis 06/06/2017   Stenosis, spinal, thoracic 12/15/2014   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 09/22/2014   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/2011    PCP: Maryl Sayres  REFERRING PROVIDER: Babs Arthea DASEN, MD  REFERRING DIAG: thoracic disc disease with myelopathy, nerve pain, adhesive arachnoiditis  Rationale for Evaluation and Treatment: Rehabilitation  THERAPY DIAG:  Other low back pain  Other symptoms and signs involving the nervous system  Radiculopathy, lumbar region  Muscle weakness (generalized)  Repeated falls  Difficulty in walking, not elsewhere  classified  ONSET DATE:  Suddenly started having weakness 11 years ago, most recent episode of worsening October 2022, s/p right L4-5 laminotomy, microdiscectomy on 10/06/2021, now s/p L4-5 PLIF by Dr. Gillie on 05/25/2022.  PERTINENT HISTORY:  Patient is a 44 y.o. male who presents to outpatient physical therapy with a referral for  medical diagnosis thoracic disc disease with myelopathy, nerve pain, adhesive arachnoiditis. This patient's chief complaints consist of disabling low back and R >L leg pain and weakness with activity, bowel and bladder urgency, incontinence, and retention, leading to the following functional deficits: difficulty with or unable to complete any activity that requires weight bearing, use of B LE, and/or balance including working, household and community mobility, walking, driving, going to family gatherings, avoiding incontinent episodes, playing with daughter, helping around the house, bed mobility, transfers. 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 Gillie on 05/25/2022, adhesive arachnoiditis, nerve pain.  Patient denies hx of cancer, stroke, seizures, lung problems, heart problems, diabetes, unexplained weight loss, and osteoporosis.  SUBJECTIVE:                                                                                                                                                                                           SUBJECTIVE STATEMENT:     Patient arrives with BRW. He states he is feeling pain the same as before. Spoke with neurologists for an appointment in march. No falls since last time. Feels sore from last visit,    PAIN:  NPRS: 2/10 in the low back, R groin and hip, down back of both legs, right calf and shin.   PRECAUTIONS: Fall  PATIENT GOALS: To get better, walk, drive again, climb stairs, play with my daughter, help around the house  again  OBJECTIVE  TREATMENT  Therapeutic activities: dynamic therapeutic activities incorporating MULTIPLE parameters or areas of the body designed to achieve improved functional performance.  UE supported squats at squat cage (holding on to BB to support bodyweight and offload lumbar spine and LE). Chair placed behind, facing outwards, RW in front, BB moved up to top hole. CGA from behind with gait belt around waist, ready to keep pt from shifting forwards out of chair if he experiences a sudden back spasm that makes his legs buckle.  1 x  8  4 x 7 1 x 6  1 min rest between sets  Seated dorsiflexion to improve ability to keep toe up when taking steps during walking R:  1x20 AROM 1x20 with 3#AW over forefoot L:  2x15 AROM   Ambulation approx 50 feet with BRW and SBA from clinic to bench outside clinic.   Therapeutic exercise: therapeutic exercises that incorporate ONE parameter at one or more areas of the body to centralize symptoms, develop strength and endurance, range of motion, and flexibility required for successful completion of functional activities.  Seated quad extension on OMEGA machine, seat position 3 B LE 1 x 8 at 35# 3 x 7 at 35# 1 x 6 at 35# 1 min rests between sets   Seated overhead pull down (B shoulder extension from ~120 degrees flexion to bar on thighs) with cable bar to improve core strength 3x20 at 35# 1 minute rest between Discontinued due to worsening R leg pain and light headed.   PATIENT EDUCATION:  Education details: Exercise purpose/form. Self management techniques. POC.  Person educated: patient Education method: Explanation Education comprehension: verbalized understanding and needs further education  HOME EXERCISE PROGRAM: Verbally:  - seated lumbar flexion stretch with head down and one LE extended using rollator, 1x10 each side.  - curl up with legs elevated, 5x15 seconds - sidelying open book 1x10 each side  Access Code: [email or web address] URL:  [email or web address] Date: 12/18/2023 Prepared by: Camie Cleverly  Exercises - Seated Diaphragmatic Breathing  - 3 reps - 1 minute practice time - Seated Pelvic Floor Elevators  - 3 x daily - 2 sets - 10 reps - Seated Ankle Plantar Flexion with Resistance Loop  - 1 x daily - 3 sets - 20 reps - Seated Ankle Dorsiflexion AROM  - 1 x daily - 3 sets - 10-30 reps - Seated Ankle Inversion AROM  - 1 x daily - 3 sets - 10-30 reps - Seated Ankle Eversion AROM  - 1 x daily - 3 sets - 10-30 reps - Sidelying Ankle Eversion Strengthening with Ankle Weight  - 1 x daily - 3 sets - 20 reps - Sidelying Ankle Inversion with Ankle Weight  - 1 x daily - 3 sets - 20 reps - Seated Ankle Dorsiflexion with Ankle Weight  - 1 x daily - 3 sets - 20 reps  ASSESSMENT:  CLINICAL IMPRESSION:     Patient felt the same between visits and experienced no falls from last time seen. He was able to correct his back and leg positioning slightly for squats but after becoming fatigued regressed back to relying more on his UE. He was ale to do an additional set in both quad extensions and seated overhead pull-downs. He continues to have significant limitations due to pain and neurologic weakness. He continued to need guarding and assistance with completing exercises. By the end of session he expressed muscles feeling tired but no increase in pain. He did not have sensations of dizziness during any time of this tx session. Patient would benefit from continued management of limiting condition by skilled physical therapist to address remaining impairments and functional limitations to work towards stated goals and return to PLOF or maximal functional independence.   OBJECTIVE IMPAIRMENTS: Abnormal gait, decreased activity tolerance, decreased balance, decreased coordination, decreased endurance, decreased knowledge of condition, decreased knowledge of use of DME, decreased mobility, difficulty walking, decreased ROM, decreased  strength, increased edema, impaired perceived functional ability, increased muscle spasms, impaired flexibility, impaired sensation, impaired tone, improper body  mechanics, postural dysfunction, obesity, and pain.   ACTIVITY LIMITATIONS: carrying, lifting, bending, standing, squatting, stairs, transfers, bed mobility, continence, bathing, toileting, dressing, hygiene/grooming, locomotion level, and caring for others  PARTICIPATION LIMITATIONS: meal prep, cleaning, laundry, interpersonal relationship, driving, shopping, community activity, occupation, yard work, and  difficulty with or unable to complete any activity that requires weight bearing, use of B LE, and/or balance including working, household and community mobility, walking, driving, going to family gatherings, avoiding incontinent episodes, playing with daughter, helping around the house, bed mobility, transfers  PERSONAL FACTORS: Past/current experiences, Time since onset of injury/illness/exacerbation, and 3+ comorbidities:  3 thoracic spine surgeries, thoracic MRI notes 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 Gillie on 05/25/2022, adhesive arachnoiditis, nerve pain are also affecting patient's functional outcome.   REHAB POTENTIAL: Fair due to severity and nature of condition.   CLINICAL DECISION MAKING: Evolving/moderate complexity  EVALUATION COMPLEXITY: Moderate   GOALS: Goals reviewed with patient? No  SHORT TERM GOALS: Target date: 01/09/2023. Target date updated to 05/28/2023 for all unmet goals on 03/05/2023.   Patient will be independent with initial home exercise program for self-management of  symptoms. Baseline: Initial HEP to be provided at visit 2 as appropriate (12/26/22); Goal status: MET  LONG TERM GOALS: Target date: 03/20/2023. Target date updated to 08/26/23  for all unmet goals on 06/03/2023. Target date updated to 11/20/2023 for all  unmet goals on 08/28/2023. Target date updated to 02/17/2024  for all unmet goals on 11/25/2023. Target date updated to 04/14/2024 for all unmet goals on 01/22/2024. Updated to 07/13/2024 for all unmet goals on 04/20/2024. Updated to 09/07/2024 for all unmet goals on 06/15/2024.   Patient will be independent with a long-term home exercise program for self-management of symptoms.  Baseline: Initial HEP to be provided at visit 2 as appropriate (12/26/22); participating as able (01/31/2023); patient currently participating as tolerated (03/05/2023; 04/09/2023; 06/03/2023); participates daily in mat exercises and in weight bearing exercises as tolerated (10/08/2023; 11/25/2023); Tries to do daily but sometimes cannot when his body won't let him (01/22/2024); participating as able including activities he finds way to mimic at home that he learns in clinic (03/17/2024); continues to complete exercises as tolerated (04/20/2024); continues to complete HEP as able (06/15/24); able to do bed and seated ankle exercises (08/27/24);  Goal status: In-progress  2.  Patient will demonstrate improved FOTO by equal or greater than 10 points by visit #13 to demonstrate improvement in overall condition and self-reported functional ability.  Baseline: to be tested visit 2 as appropriate (12/26/22); 30 at visit #3 (01/02/2023); 46 at visit #13 (03/05/2023); 45 at visit #29 (06/03/2023); 49 at visit #50 (10/08/2023);  Goal status: MET  3.  Patient will demonstrate the ability to ambulate equal or greater than 600 feet with LRAD during the 6 minute walk to improve his household and community mobility.  Baseline: 34 feet with bari-RW (12/26/22); 200 feet with bari-RW (01/31/2023); 500 feet with BRW  (03/05/2023); 400 feet with BRW (04/09/2023); 152 feet with BRW and W/C follow for safety (06/03/2023); 330 feet with BRW (10/08/2023); 245 feet with BRW and SBA for safety (11/25/2023); 343 feet with BRW and SBA for safety (01/22/2024); 340 feet with BRW and SBA for  safety (04/20/2024); 412 feet with BRW and W/C follow for safety (06/15/2024); 156 feet with BRW and close W/C follow 08/27/24); Goal status: In progress  4.  Patient will complete 5 Time Sit To Stand Test from 19.5 inch surface or lower in equal or less than 15 seconds with no UE support to demonstrate improved transfer ability for improved household and toileting mobility.  Baseline: 23 seconds with heavy B UE support on RW from 19.5 inch plinth. Pain throbbing down the right LE.  (12/26/22); 19 seconds with heavy B UE support on RW from 19.5 inch plinth (03/05/2023); 20 seconds with heavy B UE support on RRW from 18.5 inch plinth. Painful in back of B calves, thighs, and lower back (04/09/2023); 15 seconds with heavy B UE support on BRW from 18.5 inch plinth. Painful in back of B calves, thighs, and lower back (06/03/2023); 19 seconds with heavy B UE support on BRW from 18.5 inch plinth. Painful in back of B calves, thighs, and lower back (10/08/2023); 18 seconds with heavy B UE support on BRW from 18.5 inch plinth. Painful in back of B calves, thighs, and lower back (11/25/2023); 15seconds with heavy B UE support on BRW from 18.5 inch plinth. Painful in back of B calves, thighs, and lower back (01/22/2024);  14 seconds with heavy B UE support on BRW from 18.5 inch plinth. Painful in back of B calves, thighs, and lower back (04/20/2024); 13 seconds with heavy B UE support on RW and one instance of leaning back of legs on plinth due to LOB backwards (06/15/2024); 13 seconds with heavy B UE support on RW (08/27/24); Goal status: Ongoing  5.  Patient will report worst pain equal or less than 3/10 with functional activities to improve his ability to complete basic  household and community mobility. Baseline: up to 8/10 (12/26/22); reports 3/10 pain (01/31/2023); reports 4/10 pain (02/19/2023); up to 7/10 in the last 2 weeks (03/05/2023): up to  7/10 in the last two weeks (04/09/2023); up to 8/10 in the last 2 weeks (06/03/2023);  up to 9/10 over the last 2 weeks (10/08/2023); up to 7-8/10 in the last 2 weeks (11/25/2023); 7/10 in the last 2 weeks (01/22/2024); better controlled in last 2 weeks but no specific number obtained (03/18/2024); 9/10 with medication (06/15/2024); 9/10 in the last two weeks (08/27/24);  Goal status: Ongoing  6.  Patient will demonstrate improvement in Patient Specific Functional Scale (PSFS) by equal or greater than 3/10 points to reflect clinically significant improvement in patient's most valued functional activities. Baseline: 1.7/10 for Walking Around [address], Enjoying Life, and Driving (97/74/74); 7.2/89 (11/25/2023); 2.7/10 (01/22/24); 2.7/10 (04/20/2024); 2.7/10 (06/15/2024); 0.7/10 (08/27/24); Goal status: ongoing  PLAN:  PT FREQUENCY: 1-2x/week PT DURATION: 12 weeks PLANNED INTERVENTIONS: Therapeutic exercises, Therapeutic activity, Neuromuscular re-education, Balance training, Gait training, Patient/Family education, Self Care, Joint mobilization, Stair training, Orthotic/Fit training, DME instructions, Aquatic Therapy, Dry Needling, Electrical stimulation, Wheelchair mobility training, Spinal mobilization, Cryotherapy, Moist heat, Manual therapy, and Re-evaluation.  PLAN FOR NEXT SESSION:  Consider updating HEP, Continue with functional, strength, and mobility training as tolerated. Continue LE/core/functional strengthening  and balance as tolerated, education, manual therapy as needed.  Gagan Dillion SPT  Camie SAUNDERS. Juli, PT, DPT, Cert. MDT, PRA-C 09/03/24, 6:41 PM  [facility] [address] [place], [place] [address] P: [phone] I F: [phone]     "

## 2024-09-04 NOTE — Telephone Encounter (Signed)
 Requested Prescriptions   Pending Prescriptions Disp Refills   DULoxetine  (CYMBALTA ) 30 MG capsule [Pharmacy Med Name: DULoxetine  HCl 30 MG Oral Capsule Delayed Release Particles] 180 capsule 0    Sig: TAKE 3 CAPSULES BY MOUTH ONCE DAILY     Date of patient request: 09/01/2024 Last office visit: 02/19/2024 Upcoming visit: Visit date not found Date of last refill: 06/25/2024 Last refill amount: #180 0 refills  Patient cancelled last follow up with Dr. Babs

## 2024-09-04 NOTE — Telephone Encounter (Signed)
 Requested Prescriptions   Pending Prescriptions Disp Refills   gabapentin  (NEURONTIN ) 600 MG tablet [Pharmacy Med Name: Gabapentin  600 MG Oral Tablet] 120 tablet 0    Sig: Take 1 tablet by mouth 4 times daily     Date of patient request: 08/28/2024 Last office visit: 02/19/2024 Upcoming visit: 09/01/2024 Date of last refill: 07/26/2023 Last refill amount: #30 8 refills  Patient cancelled last follow up with Dr. Babs.

## 2024-09-05 NOTE — Telephone Encounter (Signed)
 Message left for patient to call and make an appointment (ph#[phone]).

## 2024-09-08 ENCOUNTER — Ambulatory Visit: Admitting: Physical Therapy

## 2024-09-10 ENCOUNTER — Ambulatory Visit: Admitting: Physical Therapy

## 2024-09-15 ENCOUNTER — Ambulatory Visit: Admitting: Physical Therapy

## 2024-09-17 ENCOUNTER — Ambulatory Visit: Admitting: Physical Therapy

## 2024-10-13 ENCOUNTER — Ambulatory Visit: Admitting: Physical Therapy

## 2024-10-20 ENCOUNTER — Ambulatory Visit: Admitting: Physical Therapy

## 2024-10-22 ENCOUNTER — Ambulatory Visit: Admitting: Physical Therapy

## 2024-10-27 ENCOUNTER — Ambulatory Visit: Admitting: Physical Therapy

## 2024-10-29 ENCOUNTER — Ambulatory Visit: Admitting: Physical Therapy

## 2024-11-03 ENCOUNTER — Ambulatory Visit: Admitting: Physical Therapy

## 2024-11-05 ENCOUNTER — Ambulatory Visit: Admitting: Physical Therapy

## 2024-11-10 ENCOUNTER — Ambulatory Visit: Admitting: Physical Therapy

## 2024-11-12 ENCOUNTER — Ambulatory Visit: Admitting: Physical Therapy

## 2024-11-16 ENCOUNTER — Ambulatory Visit: Admitting: Physical Therapy

## 2024-11-18 ENCOUNTER — Ambulatory Visit: Admitting: Physical Therapy

## 2024-11-23 ENCOUNTER — Ambulatory Visit: Admitting: Physical Therapy

## 2024-11-25 ENCOUNTER — Ambulatory Visit: Admitting: Physical Therapy

## 2024-11-30 ENCOUNTER — Ambulatory Visit: Admitting: Physical Therapy

## 2024-12-02 ENCOUNTER — Ambulatory Visit: Admitting: Physical Therapy
